# Patient Record
Sex: Male | Born: 1975 | Race: Black or African American | Hispanic: No | Marital: Single | Smoking: Former smoker
Health system: Southern US, Community
[De-identification: ages and names within clinical notes are randomized; demographics above are authoritative.]

## PROBLEM LIST (undated history)

## (undated) DIAGNOSIS — B20 Human immunodeficiency virus [HIV] disease: Secondary | ICD-10-CM

## (undated) DIAGNOSIS — F32A Depression, unspecified: Secondary | ICD-10-CM

## (undated) DIAGNOSIS — Z21 Asymptomatic human immunodeficiency virus [HIV] infection status: Secondary | ICD-10-CM

## (undated) DIAGNOSIS — F329 Major depressive disorder, single episode, unspecified: Secondary | ICD-10-CM

## (undated) HISTORY — DX: Human immunodeficiency virus (HIV) disease: B20

## (undated) HISTORY — DX: Asymptomatic human immunodeficiency virus (hiv) infection status: Z21

---

## 1997-11-30 ENCOUNTER — Encounter: Admission: RE | Admit: 1997-11-30 | Discharge: 1997-11-30 | Payer: Self-pay | Admitting: Family Medicine

## 1997-12-18 ENCOUNTER — Encounter: Admission: RE | Admit: 1997-12-18 | Discharge: 1997-12-18 | Payer: Self-pay | Admitting: Family Medicine

## 1997-12-25 ENCOUNTER — Encounter: Admission: RE | Admit: 1997-12-25 | Discharge: 1997-12-25 | Payer: Self-pay | Admitting: Family Medicine

## 1998-01-01 ENCOUNTER — Encounter: Admission: RE | Admit: 1998-01-01 | Discharge: 1998-01-01 | Payer: Self-pay | Admitting: Sports Medicine

## 1999-02-01 ENCOUNTER — Encounter: Payer: Self-pay | Admitting: Emergency Medicine

## 1999-02-01 ENCOUNTER — Emergency Department (HOSPITAL_COMMUNITY): Admission: EM | Admit: 1999-02-01 | Discharge: 1999-02-01 | Payer: Self-pay | Admitting: Emergency Medicine

## 1999-02-16 ENCOUNTER — Ambulatory Visit (HOSPITAL_COMMUNITY): Admission: RE | Admit: 1999-02-16 | Discharge: 1999-02-16 | Payer: Self-pay | Admitting: Internal Medicine

## 1999-02-16 ENCOUNTER — Encounter: Admission: RE | Admit: 1999-02-16 | Discharge: 1999-02-16 | Payer: Self-pay | Admitting: Internal Medicine

## 1999-02-16 ENCOUNTER — Encounter: Payer: Self-pay | Admitting: Internal Medicine

## 1999-02-18 ENCOUNTER — Ambulatory Visit (HOSPITAL_COMMUNITY): Admission: RE | Admit: 1999-02-18 | Discharge: 1999-02-18 | Payer: Self-pay | Admitting: *Deleted

## 1999-08-07 ENCOUNTER — Emergency Department (HOSPITAL_COMMUNITY): Admission: EM | Admit: 1999-08-07 | Discharge: 1999-08-07 | Payer: Self-pay | Admitting: Emergency Medicine

## 1999-08-11 ENCOUNTER — Ambulatory Visit (HOSPITAL_COMMUNITY): Admission: RE | Admit: 1999-08-11 | Discharge: 1999-08-11 | Payer: Self-pay | Admitting: Hematology and Oncology

## 1999-08-11 ENCOUNTER — Encounter: Admission: RE | Admit: 1999-08-11 | Discharge: 1999-08-11 | Payer: Self-pay | Admitting: Hematology and Oncology

## 1999-10-08 ENCOUNTER — Emergency Department (HOSPITAL_COMMUNITY): Admission: EM | Admit: 1999-10-08 | Discharge: 1999-10-08 | Payer: Self-pay | Admitting: Emergency Medicine

## 2000-08-08 ENCOUNTER — Encounter: Admission: RE | Admit: 2000-08-08 | Discharge: 2000-08-08 | Payer: Self-pay | Admitting: Hematology and Oncology

## 2000-08-16 ENCOUNTER — Ambulatory Visit (HOSPITAL_COMMUNITY): Admission: RE | Admit: 2000-08-16 | Discharge: 2000-08-16 | Payer: Self-pay | Admitting: *Deleted

## 2000-08-16 ENCOUNTER — Encounter: Admission: RE | Admit: 2000-08-16 | Discharge: 2000-08-16 | Payer: Self-pay | Admitting: Internal Medicine

## 2000-11-03 ENCOUNTER — Encounter: Payer: Self-pay | Admitting: Emergency Medicine

## 2000-11-03 ENCOUNTER — Emergency Department (HOSPITAL_COMMUNITY): Admission: EM | Admit: 2000-11-03 | Discharge: 2000-11-03 | Payer: Self-pay | Admitting: Emergency Medicine

## 2000-11-07 ENCOUNTER — Inpatient Hospital Stay (HOSPITAL_COMMUNITY): Admission: EM | Admit: 2000-11-07 | Discharge: 2000-11-09 | Payer: Self-pay | Admitting: Emergency Medicine

## 2000-11-07 ENCOUNTER — Encounter: Payer: Self-pay | Admitting: Internal Medicine

## 2000-11-15 ENCOUNTER — Encounter: Admission: RE | Admit: 2000-11-15 | Discharge: 2000-11-15 | Payer: Self-pay | Admitting: Internal Medicine

## 2001-04-11 ENCOUNTER — Encounter: Admission: RE | Admit: 2001-04-11 | Discharge: 2001-04-11 | Payer: Self-pay

## 2001-04-11 ENCOUNTER — Ambulatory Visit (HOSPITAL_COMMUNITY): Admission: RE | Admit: 2001-04-11 | Discharge: 2001-04-11 | Payer: Self-pay

## 2001-04-15 ENCOUNTER — Encounter: Admission: RE | Admit: 2001-04-15 | Discharge: 2001-04-15 | Payer: Self-pay | Admitting: Internal Medicine

## 2001-04-29 ENCOUNTER — Encounter: Admission: RE | Admit: 2001-04-29 | Discharge: 2001-04-29 | Payer: Self-pay | Admitting: Internal Medicine

## 2001-05-27 ENCOUNTER — Encounter: Admission: RE | Admit: 2001-05-27 | Discharge: 2001-05-27 | Payer: Self-pay | Admitting: Internal Medicine

## 2002-01-18 ENCOUNTER — Encounter: Payer: Self-pay | Admitting: Emergency Medicine

## 2002-01-18 ENCOUNTER — Emergency Department (HOSPITAL_COMMUNITY): Admission: EM | Admit: 2002-01-18 | Discharge: 2002-01-18 | Payer: Self-pay | Admitting: Emergency Medicine

## 2002-04-23 ENCOUNTER — Encounter: Admission: RE | Admit: 2002-04-23 | Discharge: 2002-04-23 | Payer: Self-pay | Admitting: Internal Medicine

## 2002-04-23 ENCOUNTER — Ambulatory Visit (HOSPITAL_COMMUNITY): Admission: RE | Admit: 2002-04-23 | Discharge: 2002-04-23 | Payer: Self-pay | Admitting: Internal Medicine

## 2002-06-17 ENCOUNTER — Ambulatory Visit (HOSPITAL_COMMUNITY): Admission: RE | Admit: 2002-06-17 | Discharge: 2002-06-17 | Payer: Self-pay | Admitting: Internal Medicine

## 2002-06-17 ENCOUNTER — Encounter: Payer: Self-pay | Admitting: Internal Medicine

## 2002-06-17 ENCOUNTER — Encounter: Admission: RE | Admit: 2002-06-17 | Discharge: 2002-06-17 | Payer: Self-pay | Admitting: Internal Medicine

## 2002-07-09 ENCOUNTER — Emergency Department (HOSPITAL_COMMUNITY): Admission: EM | Admit: 2002-07-09 | Discharge: 2002-07-09 | Payer: Self-pay | Admitting: *Deleted

## 2002-07-14 ENCOUNTER — Encounter: Admission: RE | Admit: 2002-07-14 | Discharge: 2002-07-14 | Payer: Self-pay | Admitting: Internal Medicine

## 2003-03-26 ENCOUNTER — Ambulatory Visit (HOSPITAL_COMMUNITY): Admission: RE | Admit: 2003-03-26 | Discharge: 2003-03-26 | Payer: Self-pay | Admitting: Internal Medicine

## 2003-03-26 ENCOUNTER — Encounter: Admission: RE | Admit: 2003-03-26 | Discharge: 2003-03-26 | Payer: Self-pay | Admitting: Internal Medicine

## 2003-04-08 ENCOUNTER — Encounter: Admission: RE | Admit: 2003-04-08 | Discharge: 2003-04-08 | Payer: Self-pay | Admitting: Internal Medicine

## 2003-05-05 ENCOUNTER — Encounter: Admission: RE | Admit: 2003-05-05 | Discharge: 2003-05-05 | Payer: Self-pay | Admitting: Internal Medicine

## 2003-06-16 ENCOUNTER — Emergency Department (HOSPITAL_COMMUNITY): Admission: EM | Admit: 2003-06-16 | Discharge: 2003-06-16 | Payer: Self-pay | Admitting: *Deleted

## 2003-06-16 ENCOUNTER — Encounter: Admission: RE | Admit: 2003-06-16 | Discharge: 2003-06-16 | Payer: Self-pay | Admitting: Internal Medicine

## 2004-07-30 ENCOUNTER — Emergency Department (HOSPITAL_COMMUNITY): Admission: EM | Admit: 2004-07-30 | Discharge: 2004-07-30 | Payer: Self-pay | Admitting: Emergency Medicine

## 2005-02-17 ENCOUNTER — Emergency Department (HOSPITAL_COMMUNITY): Admission: EM | Admit: 2005-02-17 | Discharge: 2005-02-17 | Payer: Self-pay | Admitting: Emergency Medicine

## 2013-08-22 ENCOUNTER — Telehealth: Payer: Self-pay

## 2013-08-22 NOTE — Telephone Encounter (Signed)
Attempted to reach patient via phone. There was no answer and he does not have voicemail.  I will try to call again next week.  [facility].   Laurell Josephsammy K Neldon Shepard, RN

## 2013-10-30 ENCOUNTER — Ambulatory Visit (INDEPENDENT_AMBULATORY_CARE_PROVIDER_SITE_OTHER): Payer: Self-pay | Admitting: Internal Medicine

## 2013-10-30 ENCOUNTER — Ambulatory Visit: Payer: Self-pay

## 2013-10-30 ENCOUNTER — Encounter: Payer: Self-pay | Admitting: Internal Medicine

## 2013-10-30 VITALS — BP 130/90 | HR 91 | Temp 98.4°F | Ht 66.0 in | Wt 125.0 lb

## 2013-10-30 DIAGNOSIS — L209 Atopic dermatitis, unspecified: Secondary | ICD-10-CM

## 2013-10-30 DIAGNOSIS — B20 Human immunodeficiency virus [HIV] disease: Secondary | ICD-10-CM

## 2013-10-30 DIAGNOSIS — Z113 Encounter for screening for infections with a predominantly sexual mode of transmission: Secondary | ICD-10-CM

## 2013-10-30 DIAGNOSIS — L2089 Other atopic dermatitis: Secondary | ICD-10-CM

## 2013-10-30 DIAGNOSIS — Z79899 Other long term (current) drug therapy: Secondary | ICD-10-CM

## 2013-10-30 LAB — COMPLETE METABOLIC PANEL WITH GFR
ALT: 12 U/L (ref 0–53)
AST: 25 U/L (ref 0–37)
Albumin: 3.7 g/dL (ref 3.5–5.2)
Alkaline Phosphatase: 123 U/L — ABNORMAL HIGH (ref 39–117)
BUN: 10 mg/dL (ref 6–23)
CO2: 29 mEq/L (ref 19–32)
Calcium: 9.2 mg/dL (ref 8.4–10.5)
Chloride: 104 mEq/L (ref 96–112)
Creat: 0.93 mg/dL (ref 0.50–1.35)
GFR, Est African American: >89 mL/min
GFR, Est Non African American: >89 mL/min
Glucose, Bld: 74 mg/dL (ref 70–99)
Potassium: 3.9 mEq/L (ref 3.5–5.3)
Sodium: 139 mEq/L (ref 135–145)
Total Bilirubin: 0.3 mg/dL (ref 0.2–1.2)
Total Protein: 7.7 g/dL (ref 6.0–8.3)

## 2013-10-30 LAB — CBC WITH DIFFERENTIAL/PLATELET
Basophils Absolute: 0 10*3/uL (ref 0.0–0.1)
Basophils Relative: 0 % (ref 0–1)
Eosinophils Absolute: 0.6 10*3/uL (ref 0.0–0.7)
Eosinophils Relative: 15 % — ABNORMAL HIGH (ref 0–5)
HCT: 40.3 % (ref 39.0–52.0)
Hemoglobin: 13.9 g/dL (ref 13.0–17.0)
Lymphocytes Relative: 8 % — ABNORMAL LOW (ref 12–46)
Lymphs Abs: 0.3 10*3/uL — ABNORMAL LOW (ref 0.7–4.0)
MCH: 29.9 pg (ref 26.0–34.0)
MCHC: 34.5 g/dL (ref 30.0–36.0)
MCV: 86.7 fL (ref 78.0–100.0)
Monocytes Absolute: 0.2 10*3/uL (ref 0.1–1.0)
Monocytes Relative: 5 % (ref 3–12)
Neutro Abs: 3 10*3/uL (ref 1.7–7.7)
Neutrophils Relative %: 72 % (ref 43–77)
Platelets: 171 10*3/uL (ref 150–400)
RBC: 4.65 MIL/uL (ref 4.22–5.81)
RDW: 14.9 % (ref 11.5–15.5)
WBC: 4.2 10*3/uL (ref 4.0–10.5)

## 2013-10-30 LAB — RPR

## 2013-10-30 LAB — HEPATITIS B SURFACE ANTIGEN: Hepatitis B Surface Ag: NEGATIVE

## 2013-10-30 LAB — LIPID PANEL
CHOLESTEROL: 141 mg/dL (ref 0–200)
HDL: 28 mg/dL — ABNORMAL LOW (ref >39)
LDL Cholesterol: 74 mg/dL (ref 0–99)
Total CHOL/HDL Ratio: 5 Ratio
Triglycerides: 196 mg/dL — ABNORMAL HIGH (ref <150)
VLDL: 39 mg/dL (ref 0–40)

## 2013-10-30 LAB — HEPATITIS B SURFACE ANTIBODY,QUALITATIVE: Hep B S Ab: POSITIVE — AB

## 2013-10-30 LAB — HEPATITIS C ANTIBODY: HCV Ab: NEGATIVE

## 2013-10-30 LAB — HEPATITIS B CORE ANTIBODY, TOTAL: HEP B C TOTAL AB: REACTIVE — AB

## 2013-10-30 LAB — HEPATITIS A ANTIBODY, TOTAL: Hep A Total Ab: REACTIVE — AB

## 2013-10-30 MED ORDER — ELVITEG-COBIC-EMTRICIT-TENOFDF 150-150-200-300 MG PO TABS: 1.0000 | ORAL_TABLET | Freq: Every day | ORAL | Status: DC

## 2013-10-30 MED ORDER — HYDROCORTISONE VALERATE 0.2 % EX OINT: 1.0000 "application " | TOPICAL_OINTMENT | Freq: Two times a day (BID) | CUTANEOUS | Status: DC

## 2013-10-30 MED ORDER — HYDROCORTISONE VALERATE 0.2 % EX OINT
1.0000 | TOPICAL_OINTMENT | Freq: Two times a day (BID) | CUTANEOUS | Status: DC
Start: 2013-10-30 — End: 2014-01-27

## 2013-10-30 NOTE — Progress Notes (Signed)
Patient here today as new transfer from [facility] in [place].  He was diagnosed in 1996 and has been out of care for at least 8 months.  His last regimen was Stribild and Bactrim. Marland Kitchen. He has complaint of unintentional weight loss and  severe itching upper body rash which includes bilateral axilla.  He has removed his shirt and I can see the irritated areas. I will ask Dr Luciana Axeomer to see patient today to evaluate rash.   No vaccine record. He will need updated pneumonia vaccine  and TB skin test at next office visit since we forgot to give it during this visit.    Laurell Josephsammy K Rajan Burgard, RN

## 2013-10-31 ENCOUNTER — Telehealth: Payer: Self-pay | Admitting: Licensed Clinical Social Worker

## 2013-10-31 ENCOUNTER — Encounter: Payer: Self-pay | Admitting: Internal Medicine

## 2013-10-31 ENCOUNTER — Other Ambulatory Visit: Payer: Self-pay | Admitting: Internal Medicine

## 2013-10-31 ENCOUNTER — Telehealth: Payer: Self-pay | Admitting: *Deleted

## 2013-10-31 LAB — HIV-1 RNA ULTRAQUANT REFLEX TO GENTYP+
HIV 1 RNA Quant: 196139 copies/mL — ABNORMAL HIGH (ref <20)
HIV-1 RNA QUANT, LOG: 5.29 {Log} — AB (ref <1.30)

## 2013-10-31 LAB — URINALYSIS
BILIRUBIN URINE: NEGATIVE
Glucose, UA: NEGATIVE mg/dL
Hgb urine dipstick: NEGATIVE
Ketones, ur: NEGATIVE mg/dL
Leukocytes, UA: NEGATIVE
Nitrite: NEGATIVE
Protein, ur: NEGATIVE mg/dL
Specific Gravity, Urine: 1.025 (ref 1.005–1.030)
UROBILINOGEN UA: 1 mg/dL (ref 0.0–1.0)
pH: 6.5 (ref 5.0–8.0)

## 2013-10-31 LAB — T-HELPER CELL (CD4) - (RCID CLINIC ONLY)
CD4 % Helper T Cell: 2 % — ABNORMAL LOW (ref 33–55)
CD4 T Cell Abs: <10 /uL — ABNORMAL LOW (ref 400–2700)

## 2013-10-31 MED ORDER — AZITHROMYCIN 600 MG PO TABS: 600.0000 mg | ORAL_TABLET | ORAL | Status: DC

## 2013-10-31 MED ORDER — SULFAMETHOXAZOLE-TMP DS 800-160 MG PO TABS: 1.0000 | ORAL_TABLET | Freq: Every day | ORAL | Status: DC

## 2013-10-31 NOTE — Telephone Encounter (Signed)
Message copied by Macy Mis on Fri Oct 31, 2013 12:11 PM ------      Message from: [name]      Created: Fri Oct 31, 2013 11:20 AM       Please let him know his CD4 came back and he should be on Bactrim DS 1 tab daily and azithromycin 1200 mg q weekly.  He is waiting for ADAP.  [facility] will [facility] it to [facility].        Thanks ------

## 2013-10-31 NOTE — Assessment & Plan Note (Signed)
I am concerned with Stribild resistance and discussed with the patient and will start Stribild again and follow closely once he gets ADAP approved.  Initial labs today.

## 2013-10-31 NOTE — Assessment & Plan Note (Signed)
Will try high potency steroid.

## 2013-10-31 NOTE — Telephone Encounter (Signed)
Called patient's case worker, Neila Gear and he will notify patient. He is going to expedite the ADAP application and see if patient can afford to get these Rxs now. If not, [facility] may be able to help. Wendall Mola

## 2013-10-31 NOTE — Progress Notes (Signed)
Patient ID: Kurt Foley, male   DOB: Oct 29, 1975, 37 y.o.   MRN: [id number] [facility] CM: Neila Gear  I was unable to complete Adap application, as viral load is not available at this time and is required for application. [facility] paid for Zithromax and Bactrim. Cm obtained a 30 day supply voucher for Stribild. Client will start all three meds today. Cm will monitor treatment adherence weekly. Adap will be approved prior to next refill.

## 2013-10-31 NOTE — Telephone Encounter (Signed)
Ok with me for the note.

## 2013-10-31 NOTE — Telephone Encounter (Signed)
Spoke with Trinna Post at [facility] and the patient wants a note so he can have a few days off because he is feeling bad and fatigue, will be starting medications this weekend. [facility] will help him get his antibiotics and stribild. Patient is going to stay out of work this weekend and hopes Dr. Luciana Axe will write the note when he returns next week.

## 2013-10-31 NOTE — Progress Notes (Signed)
   Subjective:    Patient ID: Kurt Foley, male    DOB: 22-Jul-1975, 38 y.o.   MRN: [id number]  HPI Here to establish care as a new patient with 042.  Was initially diagnosed around 1995 and has been on previous regimens with nevaripine, Kaletra and others he is unable to remember and most recently started Stribild last year while in [place].  He had been on that and was undetectable with good CD4 count but let his ADAP lapse and had sporadic care after that.  He does not know of any history of resistance though did stop and start Stribild at least twice.  Denies any history of OIs, no STIs.  He is interested in treatment.  No weight loss, no diarrhea.     Also with a rash that has been pruritic, on back and arm.  Has used Aveno, OTC hydrocortisone.     Review of Systems  Constitutional: Negative for chills, fatigue and unexpected weight change.  HENT: Negative for sore throat.   Eyes: Negative for visual disturbance.  Respiratory: Negative for cough and shortness of breath.   Cardiovascular: Negative for chest pain.  Gastrointestinal: Negative for nausea, abdominal pain and diarrhea.  Genitourinary: Negative for genital sores.  Skin: Positive for rash.  Neurological: Negative for dizziness, light-headedness and headaches.  Hematological: Negative for adenopathy.  Psychiatric/Behavioral: Negative for sleep disturbance.       Objective:   Physical Exam  Constitutional: He appears well-developed and well-nourished. No distress.  HENT:  Mouth/Throat: No oropharyngeal exudate.  Eyes: Right eye exhibits no discharge. Left eye exhibits no discharge. No scleral icterus.  Cardiovascular: Normal rate, regular rhythm and normal heart sounds.   No murmur heard. Pulmonary/Chest: Effort normal and breath sounds normal. No respiratory distress. He has no wheezes.  Abdominal: Soft. Bowel sounds are normal. He exhibits no distension. There is no tenderness.  Lymphadenopathy:    He has no  cervical adenopathy.  Skin:  + rash on back, arms, c/w atopic dermatitis  Psychiatric: He has a normal mood and affect. His behavior is normal.          Assessment & Plan:

## 2013-11-04 ENCOUNTER — Encounter: Payer: Self-pay | Admitting: Licensed Clinical Social Worker

## 2013-11-04 ENCOUNTER — Telehealth: Payer: Self-pay | Admitting: *Deleted

## 2013-11-04 DIAGNOSIS — B20 Human immunodeficiency virus [HIV] disease: Secondary | ICD-10-CM

## 2013-11-04 MED ORDER — ELVITEG-COBIC-EMTRICIT-TENOFDF 150-150-200-300 MG PO TABS: 1.0000 | ORAL_TABLET | Freq: Every day | ORAL | Status: DC

## 2013-11-04 NOTE — Telephone Encounter (Signed)
error 

## 2013-11-06 LAB — HLA B*5701: HLA-B*5701 w/rflx HLA-B High: NEGATIVE

## 2013-11-13 ENCOUNTER — Ambulatory Visit: Payer: Self-pay | Admitting: Internal Medicine

## 2013-11-13 LAB — HIV-1 GENOTYPR PLUS

## 2013-11-18 ENCOUNTER — Ambulatory Visit (INDEPENDENT_AMBULATORY_CARE_PROVIDER_SITE_OTHER): Payer: Self-pay | Admitting: Internal Medicine

## 2013-11-18 ENCOUNTER — Encounter: Payer: Self-pay | Admitting: Internal Medicine

## 2013-11-18 VITALS — BP 125/81 | HR 84 | Temp 98.2°F | Ht 66.0 in | Wt 126.0 lb

## 2013-11-18 DIAGNOSIS — Z23 Encounter for immunization: Secondary | ICD-10-CM

## 2013-11-18 DIAGNOSIS — B20 Human immunodeficiency virus [HIV] disease: Secondary | ICD-10-CM

## 2013-11-18 DIAGNOSIS — H539 Unspecified visual disturbance: Secondary | ICD-10-CM

## 2013-11-18 NOTE — Progress Notes (Signed)
   Subjective:    Patient ID: ARLOW COACHMAN, male    DOB: [birth year], 38 y.o.   MRN: [id number]  HPI  Here to establish care as a new patient with 042.  Was initially diagnosed around 1995 and has been on previous regimens with nevaripine, Kaletra and others he is unable to remember and most recently started Stribild last year while in [place].     I saw him two weeks ago and got labs with a CD4 <10 and viral load of 196,139.  He was able to get Stribild through pt assisstance, azithromycin 1200 mg weekly and bactrim DS 1 daily and taking all.  No missed doses.  ADAP still in process.     He also complains of some vision changes.  Has been ongoing for more than 2 months.  Some blurry vision, some spots, mild photophobia.  No vision loss, no change since starting Stribild.  Increased issues with accomodation.     Review of Systems  Constitutional: Negative for chills, fatigue and unexpected weight change.  HENT: Negative for sore throat.   Eyes: Negative for visual disturbance.  Respiratory: Negative for cough and shortness of breath.   Cardiovascular: Negative for chest pain.  Gastrointestinal: Negative for nausea, abdominal pain and diarrhea.  Genitourinary: Negative for genital sores.  Skin: Positive for rash.  Neurological: Negative for dizziness, light-headedness and headaches.  Hematological: Negative for adenopathy.  Psychiatric/Behavioral: Negative for sleep disturbance.       Objective:   Physical Exam  Constitutional: He appears well-developed and well-nourished. No distress.  HENT:  Mouth/Throat: No oropharyngeal exudate.  Eyes: Right eye exhibits no discharge. Left eye exhibits no discharge. No scleral icterus.  Cardiovascular: Normal rate, regular rhythm and normal heart sounds.   No murmur heard. Pulmonary/Chest: Effort normal and breath sounds normal. No respiratory distress. He has no wheezes.  Abdominal: Soft. Bowel sounds are normal. He exhibits no  distension. There is no tenderness.  Lymphadenopathy:    He has no cervical adenopathy.  Skin:  + rash on back, arms, c/w atopic dermatitis  Psychiatric: He has a normal mood and affect. His behavior is normal.          Assessment & Plan:

## 2013-11-18 NOTE — Assessment & Plan Note (Signed)
Doing well on medicaitons.  Discussed compliace, likely some fatigue while he gets better.

## 2013-11-18 NOTE — Assessment & Plan Note (Signed)
No concerning signs, no pain, has been ongoing for some time and no changes with therapy to [facility] me concerned with IRIS.  Patient counseled on concerning signs.  Will refer to opthalmology as soon as able.

## 2013-12-03 ENCOUNTER — Telehealth: Payer: Self-pay | Admitting: *Deleted

## 2013-12-03 NOTE — Telephone Encounter (Signed)
Patient forgot to follow up with [facility] for an [employer] application.  Pt still having blurry vision, but hasn't changed since his appointment.  RN advised him that if he wants to see an eye doctor, he would have to have insurance or get the orange card.  RN gave patient Karen's phone number at [facility].  Pt stated he would call today. Andree CossHowell, Michelle M, RN

## 2013-12-03 NOTE — Telephone Encounter (Signed)
error 

## 2013-12-16 ENCOUNTER — Ambulatory Visit: Payer: Self-pay | Admitting: Internal Medicine

## 2014-01-13 ENCOUNTER — Other Ambulatory Visit: Payer: Self-pay

## 2014-01-27 ENCOUNTER — Other Ambulatory Visit: Payer: Self-pay | Admitting: *Deleted

## 2014-01-27 ENCOUNTER — Other Ambulatory Visit (INDEPENDENT_AMBULATORY_CARE_PROVIDER_SITE_OTHER): Payer: Self-pay

## 2014-01-27 ENCOUNTER — Telehealth: Payer: Self-pay | Admitting: *Deleted

## 2014-01-27 DIAGNOSIS — B20 Human immunodeficiency virus [HIV] disease: Secondary | ICD-10-CM

## 2014-01-27 LAB — COMPLETE METABOLIC PANEL WITH GFR
ALK PHOS: 91 U/L (ref 39–117)
ALT: 14 U/L (ref 0–53)
AST: 23 U/L (ref 0–37)
Albumin: 3.7 g/dL (ref 3.5–5.2)
BILIRUBIN TOTAL: 0.3 mg/dL (ref 0.2–1.2)
BUN: 8 mg/dL (ref 6–23)
CO2: 24 mEq/L (ref 19–32)
Calcium: 8.6 mg/dL (ref 8.4–10.5)
Chloride: 104 mEq/L (ref 96–112)
Creat: 1.05 mg/dL (ref 0.50–1.35)
GFR, Est African American: >89 mL/min
GFR, Est Non African American: 89 mL/min
Glucose, Bld: 91 mg/dL (ref 70–99)
POTASSIUM: 4.1 meq/L (ref 3.5–5.3)
SODIUM: 137 meq/L (ref 135–145)
TOTAL PROTEIN: 7.1 g/dL (ref 6.0–8.3)

## 2014-01-27 LAB — CBC WITH DIFFERENTIAL/PLATELET
BASOS PCT: 0 % (ref 0–1)
Basophils Absolute: 0 10*3/uL (ref 0.0–0.1)
EOS PCT: 7 % — AB (ref 0–5)
Eosinophils Absolute: 0.5 10*3/uL (ref 0.0–0.7)
HEMATOCRIT: 39.9 % (ref 39.0–52.0)
Hemoglobin: 13.7 g/dL (ref 13.0–17.0)
Lymphocytes Relative: 10 % — ABNORMAL LOW (ref 12–46)
Lymphs Abs: 0.7 10*3/uL (ref 0.7–4.0)
MCH: 31.4 pg (ref 26.0–34.0)
MCHC: 34.3 g/dL (ref 30.0–36.0)
MCV: 91.3 fL (ref 78.0–100.0)
MONO ABS: 0.4 10*3/uL (ref 0.1–1.0)
Monocytes Relative: 6 % (ref 3–12)
NEUTROS ABS: 5.2 10*3/uL (ref 1.7–7.7)
Neutrophils Relative %: 77 % (ref 43–77)
Platelets: 202 10*3/uL (ref 150–400)
RBC: 4.37 MIL/uL (ref 4.22–5.81)
RDW: 16.3 % — ABNORMAL HIGH (ref 11.5–15.5)
WBC: 6.8 10*3/uL (ref 4.0–10.5)

## 2014-01-27 MED ORDER — TRIAMCINOLONE ACETONIDE 0.5 % EX OINT: 1.0000 "application " | TOPICAL_OINTMENT | Freq: Two times a day (BID) | CUTANEOUS | Status: DC

## 2014-01-27 MED ORDER — EUCERIN EX LOTN: TOPICAL_LOTION | CUTANEOUS | Status: DC | PRN

## 2014-01-27 NOTE — Telephone Encounter (Signed)
Patient unable to afford west-cort (hydrocortisone) prescribed 10/30/13. Cost was $145.  Patient has tried OTC creams since then, but they have been ineffective.  Per Baird Lyonsasey, [facility] may be able to help patient pay for combination of triamcinolone with eucerin - this was recommended by [facility] as a substitute, if acceptable.  Per pharmacy, this would cost approx $30.   Please advise if this is appropriate.  RN will notify Baird LyonsCasey @ [facility]. Andree CossHowell, Michelle M, RN

## 2014-01-27 NOTE — Telephone Encounter (Signed)
Sounds good to me thanks!

## 2014-01-28 LAB — HIV-1 RNA QUANT-NO REFLEX-BLD

## 2014-01-28 LAB — T-HELPER CELL (CD4) - (RCID CLINIC ONLY)
CD4 T CELL HELPER: 5 % — AB (ref 33–55)
CD4 T Cell Abs: 40 /uL — ABNORMAL LOW (ref 400–2700)

## 2014-02-11 ENCOUNTER — Telehealth: Payer: Self-pay | Admitting: *Deleted

## 2014-02-11 NOTE — Telephone Encounter (Signed)
Patient walked into clinic today c/o diarrhea x 2 days, fatigue, and weight loss. Checked weight and a 10 lb loss in 3 months. States he has been taking his medication regularly with only one missed dose. He has a scheduled appt with Dr. Luciana Axe for tomorrow at 9:45 AM. Advised patient to drink plenty of fluids such as water or gatoraide and to keep his appt in the AM with Dr. Luciana Axe. Patient given work note for today.

## 2014-02-12 ENCOUNTER — Encounter: Payer: Self-pay | Admitting: Internal Medicine

## 2014-02-12 ENCOUNTER — Other Ambulatory Visit: Payer: Self-pay | Admitting: *Deleted

## 2014-02-12 ENCOUNTER — Ambulatory Visit (INDEPENDENT_AMBULATORY_CARE_PROVIDER_SITE_OTHER): Payer: Self-pay | Admitting: Internal Medicine

## 2014-02-12 VITALS — BP 140/65 | HR 92 | Temp 97.5°F | Wt 117.0 lb

## 2014-02-12 DIAGNOSIS — L209 Atopic dermatitis, unspecified: Secondary | ICD-10-CM

## 2014-02-12 DIAGNOSIS — R634 Abnormal weight loss: Secondary | ICD-10-CM

## 2014-02-12 DIAGNOSIS — Z23 Encounter for immunization: Secondary | ICD-10-CM

## 2014-02-12 DIAGNOSIS — B20 Human immunodeficiency virus [HIV] disease: Secondary | ICD-10-CM

## 2014-02-12 DIAGNOSIS — L2089 Other atopic dermatitis: Secondary | ICD-10-CM

## 2014-02-12 MED ORDER — DRONABINOL 10 MG PO CAPS: 10.0000 mg | ORAL_CAPSULE | Freq: Two times a day (BID) | ORAL | Status: DC

## 2014-02-12 MED ORDER — AZITHROMYCIN 600 MG PO TABS: 600.0000 mg | ORAL_TABLET | ORAL | Status: DC

## 2014-02-12 NOTE — Assessment & Plan Note (Signed)
Improved with topical therapy.  

## 2014-02-12 NOTE — Assessment & Plan Note (Signed)
Much improved.  Still needs OI prophylaxis.  rtc 2 months.

## 2014-02-12 NOTE — Progress Notes (Signed)
   Subjective:    Patient ID: Kurt Foley, male    DOB: 29-Nov-1975, 38 y.o.   MRN: [id number]  HPI Here for follow up of 042.  Was initially diagnosed around 1995 and has been on previous regimens with nevaripine, Kaletra and others he is unable to remember and most recently started Stribild last year while in [place].    Initial CD4 <10 and viral load of 196,139 and started Stribild through pt assisstance, azithromycin 1200 mg weekly and bactrim DS 1 daily and taking all.  No missed doses.  CD 4 now up to 40 and viral load undetectable.   Vision and skin changes improved.  Some loose stools the last 2 days but is improving.  Some weight loss of 10 lbs.     Review of Systems  Constitutional: Negative for chills, fatigue and unexpected weight change.  HENT: Negative for sore throat.   Eyes: Negative for visual disturbance.  Respiratory: Negative for cough and shortness of breath.   Cardiovascular: Negative for chest pain.  Gastrointestinal: Negative for nausea, abdominal pain and diarrhea.  Genitourinary: Negative for genital sores.  Skin: Positive for rash.  Neurological: Negative for dizziness, light-headedness and headaches.  Hematological: Negative for adenopathy.  Psychiatric/Behavioral: Negative for sleep disturbance.       Objective:   Physical Exam  Constitutional: He appears well-developed and well-nourished. No distress.  HENT:  Mouth/Throat: No oropharyngeal exudate.  Eyes: Right eye exhibits no discharge. Left eye exhibits no discharge. No scleral icterus.  Cardiovascular: Normal rate, regular rhythm and normal heart sounds.   No murmur heard. Pulmonary/Chest: Effort normal and breath sounds normal. No respiratory distress. He has no wheezes.  Abdominal: Soft. Bowel sounds are normal. He exhibits no distension. There is no tenderness.  Lymphadenopathy:    He has no cervical adenopathy.  Skin:  + rash on back, arms, c/w atopic dermatitis  Psychiatric: He has  a normal mood and affect. His behavior is normal.          Assessment & Plan:

## 2014-02-12 NOTE — Assessment & Plan Note (Signed)
Poor appetite so will try marinol.  Encouraged to eat protein, mvi.

## 2014-03-13 ENCOUNTER — Telehealth: Payer: Self-pay | Admitting: Licensed Clinical Social Worker

## 2014-03-13 ENCOUNTER — Emergency Department (INDEPENDENT_AMBULATORY_CARE_PROVIDER_SITE_OTHER)
Admission: EM | Admit: 2014-03-13 | Discharge: 2014-03-13 | Disposition: A | Discharge status: Nursing Home (Includes ICF) | Payer: Self-pay | Source: Home / Self Care | Attending: Family Medicine | Admitting: Family Medicine

## 2014-03-13 ENCOUNTER — Emergency Department (HOSPITAL_COMMUNITY): Payer: Self-pay

## 2014-03-13 ENCOUNTER — Encounter (HOSPITAL_COMMUNITY): Payer: Self-pay | Admitting: Emergency Medicine

## 2014-03-13 ENCOUNTER — Emergency Department (HOSPITAL_COMMUNITY)
Admission: EM | Admit: 2014-03-13 | Discharge: 2014-03-13 | Disposition: A | Discharge status: Home / Self Care | Payer: Self-pay | Attending: Emergency Medicine | Admitting: Emergency Medicine

## 2014-03-13 DIAGNOSIS — Z720 Tobacco use: Secondary | ICD-10-CM | POA: Insufficient documentation

## 2014-03-13 DIAGNOSIS — J029 Acute pharyngitis, unspecified: Secondary | ICD-10-CM | POA: Insufficient documentation

## 2014-03-13 DIAGNOSIS — R0602 Shortness of breath: Secondary | ICD-10-CM

## 2014-03-13 DIAGNOSIS — B20 Human immunodeficiency virus [HIV] disease: Secondary | ICD-10-CM

## 2014-03-13 DIAGNOSIS — G44039 Episodic paroxysmal hemicrania, not intractable: Secondary | ICD-10-CM | POA: Insufficient documentation

## 2014-03-13 DIAGNOSIS — Z7952 Long term (current) use of systemic steroids: Secondary | ICD-10-CM | POA: Insufficient documentation

## 2014-03-13 DIAGNOSIS — R197 Diarrhea, unspecified: Secondary | ICD-10-CM

## 2014-03-13 DIAGNOSIS — Z79899 Other long term (current) drug therapy: Secondary | ICD-10-CM | POA: Insufficient documentation

## 2014-03-13 DIAGNOSIS — Z21 Asymptomatic human immunodeficiency virus [HIV] infection status: Secondary | ICD-10-CM | POA: Insufficient documentation

## 2014-03-13 DIAGNOSIS — J3489 Other specified disorders of nose and nasal sinuses: Secondary | ICD-10-CM

## 2014-03-13 LAB — CBC WITH DIFFERENTIAL/PLATELET
BASOS PCT: 0 % (ref 0–1)
Basophils Absolute: 0 10*3/uL (ref 0.0–0.1)
EOS ABS: 0.2 10*3/uL (ref 0.0–0.7)
Eosinophils Relative: 3 % (ref 0–5)
HCT: 43.7 % (ref 39.0–52.0)
Hemoglobin: 14.9 g/dL (ref 13.0–17.0)
Lymphocytes Relative: 12 % (ref 12–46)
Lymphs Abs: 0.8 10*3/uL (ref 0.7–4.0)
MCH: 31.6 pg (ref 26.0–34.0)
MCHC: 34.1 g/dL (ref 30.0–36.0)
MCV: 92.8 fL (ref 78.0–100.0)
MONOS PCT: 6 % (ref 3–12)
Monocytes Absolute: 0.4 10*3/uL (ref 0.1–1.0)
Neutro Abs: 5.2 10*3/uL (ref 1.7–7.7)
Neutrophils Relative %: 79 % — ABNORMAL HIGH (ref 43–77)
Platelets: 217 10*3/uL (ref 150–400)
RBC: 4.71 MIL/uL (ref 4.22–5.81)
RDW: 13.9 % (ref 11.5–15.5)
WBC: 6.6 10*3/uL (ref 4.0–10.5)

## 2014-03-13 LAB — BASIC METABOLIC PANEL
Anion gap: 13 (ref 5–15)
BUN: 7 mg/dL (ref 6–23)
CO2: 24 mEq/L (ref 19–32)
Calcium: 9.1 mg/dL (ref 8.4–10.5)
Chloride: 101 mEq/L (ref 96–112)
Creatinine, Ser: 0.94 mg/dL (ref 0.50–1.35)
Glucose, Bld: 106 mg/dL — ABNORMAL HIGH (ref 70–99)
POTASSIUM: 3.9 meq/L (ref 3.7–5.3)
SODIUM: 138 meq/L (ref 137–147)

## 2014-03-13 MED ORDER — ACETAMINOPHEN 500 MG PO TABS
500.0000 mg | ORAL_TABLET | Freq: Once | ORAL | Status: AC
  Administered 2014-03-13: 500 mg via ORAL
  Filled 2014-03-13: qty 1

## 2014-03-13 NOTE — ED Notes (Signed)
Pt A&OX4, ambulatory at d/c with steady gait, NAD 

## 2014-03-13 NOTE — ED Notes (Signed)
EKG hand delivered to Dr. Walden. 

## 2014-03-13 NOTE — Telephone Encounter (Signed)
Patient complains of diarrhea, abdominal pain, sob, cough, fatigue, and dizziness since 2 days ago. Patient wanted to be seen in our office today at 4:30 pm, I explained to him that our office was closed and no providers were available. I advised the patient to go the ED, due to the extent of his symptoms and that his viral load was really low as of 1 month ago. Patient agreed and was ok with this plan.

## 2014-03-13 NOTE — ED Provider Notes (Signed)
Kurt SolianReginald K Foley is a 38 y.o. male who presents to Urgent Care today for fatigue shortness of breath diarrhea and a cough. This is also associated with headache. Symptoms present for 2 days. Patient has a history of HIV with low CD4 count at 40 measured about a month ago.  He has a history of noncompliance but is currently taking his medication.  He notes mild nonradiating nonexertional chest pain as well. He has not tried any medications yet. He called his infectious disease clinic today and was advised to go to the emergency department.   Past Medical History  Diagnosis Date  . HIV infection    History  Substance Use Topics  . Smoking status: Current Every Day Smoker -- 0.50 packs/day for 25 years    Types: Cigarettes  . Smokeless tobacco: Never Used     Comment: cutting back  . Alcohol Use: 1.0 oz/week    2 drink(s) per week     Comment: occasional   ROS as above Medications: No current facility-administered medications for this encounter.   Current Outpatient Prescriptions  Medication Sig Dispense Refill  . elvitegravir-cobicistat-emtricitabine-tenofovir (STRIBILD) 150-150-200-300 MG TABS tablet Take 1 tablet by mouth daily with breakfast.  30 tablet  5  . azithromycin (ZITHROMAX) 600 MG tablet Take 1 tablet (600 mg total) by mouth every 7 (seven) days.  10 tablet  5  . dronabinol (MARINOL) 10 MG capsule Take 1 capsule (10 mg total) by mouth 2 (two) times daily before a meal.  60 capsule  5  . Emollient (EUCERIN) lotion Apply topically as needed for dry skin.  240 mL  1  . sulfamethoxazole-trimethoprim (BACTRIM DS) 800-160 MG per tablet Take 1 tablet by mouth daily.  30 tablet  11  . triamcinolone ointment (KENALOG) 0.5 % Apply 1 application topically 2 (two) times daily.  30 g  1    Exam:  BP 151/111  Pulse 123  Temp(Src) 98.1 F (36.7 C) (Oral)  Resp 18  SpO2 98% Gen: Well NAD nontoxic appearing HEENT: EOMI,  MMM Lungs: Normal work of breathing. Decreased breath sounds  right lung compared to left. Heart: RRR no MRG Abd: NABS, Soft. Nondistended, Nontender Exts: Brisk capillary refill, warm and well perfused.   Twelve-lead EKG shows normal sinus rhythm at 90 beats per minute. Short PR at 110 ms. No ST segment elevation or depression. Small Q waves in lead II  No results found for this or any previous visit (from the past 24 hour(s)). No results found.  Assessment and Plan: 38 y.o. male with AIDS with cough congestion shortness of breath diarrhea and fatigue. The patient has a large differential. We're unable to perform chest x-rays at this location currently. Patient likely will require more care than we are able to provide. Plan to transfer to the emergency department for further evaluation and management. Transfer via shuttle.  Discussed warning signs or symptoms. Please see discharge instructions. Patient expresses understanding.     Rodolph BongEvan S Jennamarie Goings, MD 03/13/14 [phone]

## 2014-03-13 NOTE — ED Notes (Signed)
C/o feeling fatigue, weakness, diarrhea, epigastric pain, chills, runny nose, cough onset 2 days Denies f/v Alert, no signs of acute distress.

## 2014-03-13 NOTE — ED Notes (Signed)
Pt in c/o cough and congestion also headache and fatigue, denies fever at home, states cough has been productive with clear sputum

## 2014-03-13 NOTE — ED Provider Notes (Signed)
CSN: [id number]     Arrival date & time 03/13/14  1802 History   First MD Initiated Contact with Patient 03/13/14 2043     Chief Complaint  Patient presents with  . Cough  . Headache   Patient is a 38 y.o. male presenting with cough and headaches. The history is provided by the patient.  Cough Cough characteristics:  Dry Onset quality:  Gradual Duration:  2 days Progression:  Unchanged Chronicity:  New Associated symptoms: headaches and rhinorrhea   Associated symptoms: no chest pain, no chills, no fever, no rash, no shortness of breath and no sore throat   Headaches:    Severity:  Moderate   Onset quality:  Gradual   Duration:  2 days   Timing:  Intermittent   Progression:  Waxing and waning   Chronicity:  Recurrent Headache Associated symptoms: congestion and cough   Associated symptoms: no abdominal pain, no back pain, no diarrhea, no fever, no nausea, no neck pain, no sore throat and no vomiting    38 year old [place] male with a history of HIV most recent CD4 count of 40 presents with history of viral URI symptoms and headache. Symptoms have been ongoing for the last 2 days. The patient was concerned because of his low CD4 count and decided to be checked the urgent care. Urgent care recommended that the patient come to the emergency department for further evaluation. Patient denies fevers chills or night sweats. He denies extremity numbness, weakness, blurred vision, difficulty speaking. He denies cough productive of sputum. Past Medical History  Diagnosis Date  . HIV infection    History reviewed. No pertinent past surgical history. Family History  Problem Relation Age of Onset  . Diabetes Mother   . Hypertension Mother   . Arthritis Mother    History  Substance Use Topics  . Smoking status: Current Every Day Smoker -- 0.50 packs/day for 25 years    Types: Cigarettes  . Smokeless tobacco: Never Used     Comment: cutting back  . Alcohol Use: 1.0 oz/week   2 drink(s) per week     Comment: occasional    Review of Systems  Constitutional: Negative for fever and chills.  HENT: Positive for congestion and rhinorrhea. Negative for sore throat.   Eyes: Negative for visual disturbance.  Respiratory: Positive for cough. Negative for shortness of breath.   Cardiovascular: Negative for chest pain and palpitations.  Gastrointestinal: Negative for nausea, vomiting, abdominal pain, diarrhea and constipation.  Genitourinary: Negative for dysuria and hematuria.  Musculoskeletal: Negative for back pain and neck pain.  Skin: Negative for rash.  Neurological: Positive for headaches. Negative for syncope.  Psychiatric/Behavioral: Negative for confusion.  All other systems reviewed and are negative.   Allergies  Review of patient's allergies indicates no known allergies.  Home Medications   Prior to Admission medications   Medication Sig Start Date End Date Taking? Authorizing Provider  dronabinol (MARINOL) 10 MG capsule Take 10 mg by mouth 2 (two) times daily before a meal. 02/12/14  Yes Gardiner Barefootobert W Comer, MD  elvitegravir-cobicistat-emtricitabine-tenofovir (STRIBILD) 150-150-200-300 MG TABS tablet Take 1 tablet by mouth daily with breakfast. 11/04/13  Yes Randall Hissornelius N Van Dam, MD  Emollient (EUCERIN) lotion Apply topically as needed for dry skin. 01/27/14  Yes Gardiner Barefootobert W Comer, MD  triamcinolone ointment (KENALOG) 0.5 % Apply 1 application topically 2 (two) times daily. 01/27/14  Yes Gardiner Barefootobert W Comer, MD   BP 135/98  Pulse 82  Temp(Src) 98.2 F (36.8 C) (  Oral)  Resp 16  SpO2 99% Physical Exam  Constitutional: He is oriented to person, place, and time. He appears well-developed and well-nourished. No distress.  Thin appearing AAM  HENT:  Head: Normocephalic and atraumatic.  Mouth/Throat: Oropharynx is clear and moist.  No thrush  Eyes: EOM are normal.  Neck: Neck supple. No JVD present.  Cardiovascular: Normal rate, regular rhythm, normal heart sounds  and intact distal pulses.   Pulmonary/Chest: Effort normal and breath sounds normal.  Abdominal: Soft. He exhibits no distension. There is no tenderness.  Musculoskeletal: Normal range of motion. He exhibits no edema.  Neurological: He is alert and oriented to person, place, and time. No cranial nerve deficit.  Skin: Skin is warm and dry.  Psychiatric: His behavior is normal.    ED Course  Procedures  None   Labs Review Labs Reviewed  CBC WITH DIFFERENTIAL - Abnormal; Notable for the following:    Neutrophils Relative % 79 (*)    All other components within normal limits  BASIC METABOLIC PANEL - Abnormal; Notable for the following:    Glucose, Bld 106 (*)    All other components within normal limits    Imaging Review Dg Chest 2 View  03/13/2014   CLINICAL DATA:  Initial encounter, pressure in back of head for 2 days. Acute on chronic shortness of breath with exertion.  EXAM: CHEST  2 VIEW  COMPARISON:  None.  FINDINGS: Trachea is midline. Heart size normal. Lungs appear hyperinflated but clear. No pleural fluid. Old left rib fracture.  IMPRESSION: Hyperinflation without acute finding.   Electronically Signed   By: Leanna Battles M.D.   On: 03/13/2014 22:38    EKG Interpretation Date/Time:  Friday March 13 2014 21:40:05 EDT Ventricular Rate:  76 PR Interval:  122 QRS Duration: 92 QT Interval:  391 QTC Calculation: 440 R Axis:   83 Text Interpretation:  Sinus rhythm Biatrial enlargement Left ventricular  hypertrophy Confirmed by Gwendolyn Grant  MD, BLAIR ([id number]) on 03/13/2014 9:43:41 PM    MDM   Final diagnoses:  Nonintractable paroxysmal hemicrania, unspecified chronicity pattern  Rhinorrhea  Sore throat   38 year old [place] American male with history of HIV who presents with fatigue headache and viral URI symptoms. Patient is well appearing on exam. He is afebrile vital signs are stable. Chest is nontender to palpation lungs are clear to auscultation. Abdomen is soft nontender  and nondistended. Because membranes appear moist. Labs obtained are unremarkable. A chest x-ray does not show any focal consolidation concerning for pneumonia. Would like to obtain a CT head to rule out any intracranial abnormality given the patient's most recent CD4 count of 40. However the patient refuses to have this study performed. After further questioning the patient states that his headaches are chronic and intermittent. This headache is similar to prior headaches. Feel this is reasonable as he has a normal neuro exam and no fever. Patient states he has close followup with his primary care provider. Patient given Tylenol her symptoms. Feel he is stable for discharge at this time.  Case discussed with Dr. Gwendolyn Grant.  Maris Berger, MD  Maris Berger, MD 03/14/14 [phone]

## 2014-03-14 NOTE — ED Provider Notes (Signed)
I saw and evaluated the patient, reviewed the resident's note and I agree with the findings and plan.   EKG Interpretation   Date/Time:  Friday March 13 2014 21:40:05 EDT Ventricular Rate:  76 PR Interval:  122 QRS Duration: 92 QT Interval:  391 QTC Calculation: 440 R Axis:   83 Text Interpretation:  Sinus rhythm Biatrial enlargement Left ventricular  hypertrophy Confirmed by Gwendolyn GrantWALDEN  MD, Zion Ta ([id number]) on 03/13/2014 9:43:41 PM      Patient here with headaches, cough. Hx of HIV/AIDS with last CD4 count last than 40. Patient well-appearing, neurologically intact. CXR clear, labs unremarkable. Refused head CT. Patient wants to leave, well-appearing, instructed to f/u with PCP.  Elwin MochaBlair Ryson Bacha, MD 03/14/14 [phone]

## 2014-03-31 ENCOUNTER — Other Ambulatory Visit: Payer: Self-pay

## 2014-04-14 ENCOUNTER — Ambulatory Visit: Payer: Self-pay | Admitting: Internal Medicine

## 2014-04-14 ENCOUNTER — Telehealth: Payer: Self-pay | Admitting: *Deleted

## 2014-04-14 NOTE — Telephone Encounter (Signed)
Called and left patient a message to call the clinic to reschedule his appt. He no showed today.

## 2014-06-17 ENCOUNTER — Emergency Department (HOSPITAL_COMMUNITY): Payer: Self-pay

## 2014-06-17 ENCOUNTER — Encounter (HOSPITAL_COMMUNITY): Payer: Self-pay | Admitting: *Deleted

## 2014-06-17 ENCOUNTER — Emergency Department (HOSPITAL_COMMUNITY)
Admission: EM | Admit: 2014-06-17 | Discharge: 2014-06-18 | Disposition: A | Discharge status: Home / Self Care | Payer: Self-pay | Attending: Emergency Medicine | Admitting: Emergency Medicine

## 2014-06-17 DIAGNOSIS — R400 Somnolence: Secondary | ICD-10-CM | POA: Insufficient documentation

## 2014-06-17 DIAGNOSIS — Z7952 Long term (current) use of systemic steroids: Secondary | ICD-10-CM | POA: Insufficient documentation

## 2014-06-17 DIAGNOSIS — Z720 Tobacco use: Secondary | ICD-10-CM | POA: Insufficient documentation

## 2014-06-17 DIAGNOSIS — Z21 Asymptomatic human immunodeficiency virus [HIV] infection status: Secondary | ICD-10-CM | POA: Insufficient documentation

## 2014-06-17 DIAGNOSIS — Z79899 Other long term (current) drug therapy: Secondary | ICD-10-CM | POA: Insufficient documentation

## 2014-06-17 LAB — ETHANOL: Alcohol, Ethyl (B): <5 mg/dL (ref 0–9)

## 2014-06-17 LAB — CBC
HCT: 44.1 % (ref 39.0–52.0)
HEMOGLOBIN: 14.6 g/dL (ref 13.0–17.0)
MCH: 30.5 pg (ref 26.0–34.0)
MCHC: 33.1 g/dL (ref 30.0–36.0)
MCV: 92.3 fL (ref 78.0–100.0)
PLATELETS: 193 10*3/uL (ref 150–400)
RBC: 4.78 MIL/uL (ref 4.22–5.81)
RDW: 13.7 % (ref 11.5–15.5)
WBC: 6 10*3/uL (ref 4.0–10.5)

## 2014-06-17 LAB — BASIC METABOLIC PANEL
Anion gap: 10 (ref 5–15)
BUN: 7 mg/dL (ref 6–23)
CALCIUM: 9.2 mg/dL (ref 8.4–10.5)
CHLORIDE: 104 meq/L (ref 96–112)
CO2: 23 mmol/L (ref 19–32)
Creatinine, Ser: 0.83 mg/dL (ref 0.50–1.35)
Glucose, Bld: 92 mg/dL (ref 70–99)
Potassium: 4 mmol/L (ref 3.5–5.1)
Sodium: 137 mmol/L (ref 135–145)

## 2014-06-17 LAB — I-STAT TROPONIN, ED: TROPONIN I, POC: 0 ng/mL (ref 0.00–0.08)

## 2014-06-17 LAB — RAPID URINE DRUG SCREEN, HOSP PERFORMED
AMPHETAMINES: NOT DETECTED
BARBITURATES: NOT DETECTED
Benzodiazepines: NOT DETECTED
Cocaine: POSITIVE — AB
Opiates: NOT DETECTED
Tetrahydrocannabinol: POSITIVE — AB

## 2014-06-17 LAB — TROPONIN I: Troponin I: <0.03 ng/mL (ref <0.031)

## 2014-06-17 NOTE — ED Notes (Signed)
Patient transported to CT 

## 2014-06-17 NOTE — Discharge Instructions (Signed)
Confusion Confusion is the inability to think with your usual speed or clarity. Confusion may come on quickly or slowly over time. How quickly the confusion comes on depends on the cause. Confusion can be due to any number of causes. CAUSES   Concussion, head injury, or head trauma.  Seizures.  Stroke.  Fever.  Brain tumor.  Age related decreased brain function (dementia).  Heightened emotional states like rage or terror.  Mental illness in which the person loses the ability to determine what is real and what is not (hallucinations).  Infections such as a urinary tract infection (UTI).  Toxic effects from alcohol, drugs, or prescription medicines.  Dehydration and an imbalance of salts in the body (electrolytes).  Lack of sleep.  Low blood sugar (diabetes).  Low levels of oxygen from conditions such as chronic lung disorders.  Drug interactions or other medicine side effects.  Nutritional deficiencies, especially niacin, thiamine, vitamin C, or vitamin B.  Sudden drop in body temperature (hypothermia).  Change in routine, such as when traveling or hospitalized. SIGNS AND SYMPTOMS  People often describe their thinking as cloudy or unclear when they are confused. Confusion can also include feeling disoriented. That means you are unaware of where or who you are. You may also not know what the date or time is. If confused, you may also have difficulty paying attention, remembering, and making decisions. Some people also act aggressively when they are confused.  DIAGNOSIS  The medical evaluation of confusion may include:  Blood and urine tests.  X-rays.  Brain and nervous system tests.  Analyzing your brain waves (electroencephalogram or EEG).  Magnetic resonance imaging (MRI) of your head.  Computed tomography (CT) scan of your head.  Mental status tests in which your health care provider may ask many questions. Some of these questions may seem silly or strange,  but they are a very important test to help diagnose and treat confusion. TREATMENT  An admission to the hospital may not be needed, but a person with confusion should not be left alone. Stay with a family member or friend until the confusion clears. Avoid alcohol, pain relievers, or sedative drugs until you have fully recovered. Do not drive until directed by your health care provider. HOME CARE INSTRUCTIONS  What family and friends can do:  To find out if someone is confused, ask the person to state his or her name, age, and the date. If the person is unsure or answers incorrectly, he or she is confused.  Always introduce yourself, no matter how well the person knows you.  Often remind the person of his or her location.  Place a calendar and clock near the confused person.  Help the person with his or her medicines. You may want to use a pill box, an alarm as a reminder, or give the person each dose as prescribed.  Talk about current events and plans for the day.  Try to keep the environment calm, quiet, and peaceful.  Make sure the person keeps follow-up visits with his or her health care provider. PREVENTION  Ways to prevent confusion:  Avoid alcohol.  Eat a balanced diet.  Get enough sleep.  Take medicine only as directed by your health care provider.  Do not become isolated. Spend time with other people and make plans for your days.  Keep careful watch on your blood sugar levels if you are diabetic. SEEK IMMEDIATE MEDICAL CARE IF:   You develop severe headaches, repeated vomiting, seizures, blackouts, or   slurred speech.  There is increasing confusion, weakness, numbness, restlessness, or personality changes.  You develop a loss of balance, have marked dizziness, feel uncoordinated, or fall.  You have delusions, hallucinations, or develop severe anxiety.  Your family members think you need to be rechecked. Document Released: 07/06/2004 Document Revised: 10/13/2013  Document Reviewed: 07/04/2013 Exit[employer] Patient Information 2015 [employer]. This information is not intended to replace advice given to you by your health care provider. Make sure you discuss any questions you have with your health care provider.  

## 2014-06-17 NOTE — ED Notes (Signed)
Pt in stating that he thinks he passed out earlier, states his family had a hard time waking him up, reports generalized illness over the last few days, body aches and fatigue, also reports cough and chest pain, no distress noted

## 2014-06-17 NOTE — ED Provider Notes (Signed)
CSN: [id number]     Arrival date & time 06/17/14  1703 History   First MD Initiated Contact with Patient 06/17/14 1832     Chief Complaint  Patient presents with  . Loss of Consciousness  . Generalized Body Aches     (Consider location/radiation/quality/duration/timing/severity/associated sxs/prior Treatment) Patient is a 39 y.o. male presenting with altered mental status.  Altered Mental Status Presenting symptoms: partial responsiveness (drowsiness)   Severity:  Moderate Most recent episode:  Today Episode history:  Multiple Duration:  1 day Timing:  Constant Progression:  Resolved Chronicity:  New Context: not a recent illness   Context comment:  HIV Associated symptoms: no abdominal pain, no decreased appetite, no fever, no light-headedness, no nausea, no visual change and no vomiting     Past Medical History  Diagnosis Date  . HIV infection    History reviewed. No pertinent past surgical history. Family History  Problem Relation Age of Onset  . Diabetes Mother   . Hypertension Mother   . Arthritis Mother    History  Substance Use Topics  . Smoking status: Current Every Day Smoker -- 0.50 packs/day for 25 years    Types: Cigarettes  . Smokeless tobacco: Never Used     Comment: cutting back  . Alcohol Use: 1.0 oz/week    2 drink(s) per week     Comment: occasional    Review of Systems  Constitutional: Negative for fever and decreased appetite.  Gastrointestinal: Negative for nausea, vomiting and abdominal pain.  Neurological: Negative for light-headedness.  All other systems reviewed and are negative.     Allergies  Review of patient's allergies indicates no known allergies.  Home Medications   Prior to Admission medications   Medication Sig Start Date End Date Taking? Authorizing Provider  dronabinol (MARINOL) 10 MG capsule Take 10 mg by mouth 2 (two) times daily before a meal. 02/12/14  Yes Gardiner Barefootobert W Comer, MD   elvitegravir-cobicistat-emtricitabine-tenofovir (STRIBILD) 150-150-200-300 MG TABS tablet Take 1 tablet by mouth daily with breakfast. 11/04/13  Yes Randall Hissornelius N Van Dam, MD  Emollient (EUCERIN) lotion Apply topically as needed for dry skin. 01/27/14  Yes Gardiner Barefootobert W Comer, MD  triamcinolone ointment (KENALOG) 0.5 % Apply 1 application topically 2 (two) times daily. 01/27/14  Yes Gardiner Barefootobert W Comer, MD   BP 114/79 mmHg  Pulse 90  Temp(Src) 98.8 F (37.1 C) (Oral)  Resp 21  SpO2 96% Physical Exam  Constitutional: He is oriented to person, place, and time. He appears well-developed and well-nourished.  HENT:  Head: Normocephalic and atraumatic.  Eyes: Conjunctivae and EOM are normal.  Neck: Normal range of motion. Neck supple.  Cardiovascular: Normal rate, regular rhythm and normal heart sounds.   Pulmonary/Chest: Effort normal and breath sounds normal. No respiratory distress.  Abdominal: He exhibits no distension. There is no tenderness. There is no rebound and no guarding.  Musculoskeletal: Normal range of motion.  Neurological: He is alert and oriented to person, place, and time. He has normal strength and normal reflexes. No cranial nerve deficit or sensory deficit. Gait normal. GCS eye subscore is 4. GCS verbal subscore is 5. GCS motor subscore is 6.  Skin: Skin is warm and dry.  Vitals reviewed.   ED Course  Procedures (including critical care time) Labs Review Labs Reviewed  URINE RAPID DRUG SCREEN (HOSP PERFORMED) - Abnormal; Notable for the following:    Cocaine POSITIVE (*)    Tetrahydrocannabinol POSITIVE (*)    All other components within normal limits  CBC  BASIC METABOLIC PANEL  ETHANOL  TROPONIN I  Rosezena Sensor, ED    Imaging Review Dg Chest 2 View  06/17/2014   CLINICAL DATA:  Fall. Loss of consciousness. Body aches. HIV. Tobacco use.  EXAM: CHEST  2 VIEW  COMPARISON:  03/13/2014  FINDINGS: Old healed left rib fractures. Cardiac and mediastinal margins appear  normal. The lungs appear clear. No pleural effusion.  IMPRESSION: 1. No significant radiographic abnormality.   Electronically Signed   By: Herbie Baltimore M.D.   On: 06/17/2014 20:03   Ct Head Wo Contrast  06/17/2014   CLINICAL DATA:  Acute onset of dizziness and drowsiness. Initial encounter.  EXAM: CT HEAD WITHOUT CONTRAST  TECHNIQUE: Contiguous axial images were obtained from the base of the skull through the vertex without intravenous contrast.  COMPARISON:  None.  FINDINGS: There is no evidence of acute infarction, mass lesion, or intra- or extra-axial hemorrhage on CT.  The posterior fossa, including the cerebellum, brainstem and fourth ventricle, is within normal limits. The third and lateral ventricles, and basal ganglia are unremarkable in appearance. The cerebral hemispheres are symmetric in appearance, with normal gray-white differentiation. No mass effect or midline shift is seen.  There is no evidence of fracture; visualized osseous structures are unremarkable in appearance. The visualized portions of the orbits are within normal limits. The paranasal sinuses and mastoid air cells are well-aerated. No significant soft tissue abnormalities are seen.  IMPRESSION: Unremarkable noncontrast CT of the head.   Electronically Signed   By: Roanna Raider M.D.   On: 06/17/2014 22:11     EKG Interpretation   Date/Time:  Wednesday June 17 2014 17:10:50 EST Ventricular Rate:  92 PR Interval:  126 QRS Duration: 72 QT Interval:  338 QTC Calculation: 417 R Axis:   87 Text Interpretation:  Normal sinus rhythm Right atrial enlargement Left  ventricular hypertrophy Cannot rule out Septal infarct , age undetermined  Abnormal ECG rate has decreased since last tracing Confirmed by Mirian Mo [phone]) on 06/17/2014 6:39:48 PM      MDM   Final diagnoses:  Drowsiness    39 y.o. male with pertinent PMH of HIV presents with drowsiness and difficulty awakening throughout the course of the day.   On arrival today pt has no complaint, has normal vitals as above.  WU unremarkable.  Unknown etiology of symptoms, however pt denies lateralizing neuro symptoms, frank syncope or unresponsiveness, and is well appearing.  DC home in stable condition with standard return precautions and PCP fu.    I have reviewed all laboratory and imaging studies if ordered as above  1. Drowsiness         Mirian Mo, MD 06/17/14 [phone]

## 2014-06-18 ENCOUNTER — Other Ambulatory Visit (INDEPENDENT_AMBULATORY_CARE_PROVIDER_SITE_OTHER): Payer: Self-pay

## 2014-06-18 ENCOUNTER — Emergency Department (HOSPITAL_COMMUNITY)
Admission: EM | Admit: 2014-06-18 | Discharge: 2014-06-18 | Disposition: A | Discharge status: Home / Self Care | Payer: Self-pay

## 2014-06-18 ENCOUNTER — Other Ambulatory Visit: Payer: Self-pay | Admitting: *Deleted

## 2014-06-18 DIAGNOSIS — B20 Human immunodeficiency virus [HIV] disease: Secondary | ICD-10-CM

## 2014-06-18 NOTE — ED Notes (Signed)
Pt called with no answer

## 2014-06-19 LAB — T-HELPER CELL (CD4) - (RCID CLINIC ONLY)
CD4 T CELL ABS: 40 /uL — AB (ref 400–2700)
CD4 T CELL HELPER: 6 % — AB (ref 33–55)

## 2014-06-20 LAB — HIV-1 RNA QUANT-NO REFLEX-BLD
HIV 1 RNA Quant: 29286 copies/mL — ABNORMAL HIGH (ref <20)
HIV-1 RNA QUANT, LOG: 4.47 {Log} — AB (ref <1.30)

## 2014-06-21 ENCOUNTER — Other Ambulatory Visit: Payer: Self-pay | Admitting: Internal Medicine

## 2014-07-07 ENCOUNTER — Ambulatory Visit: Payer: Self-pay

## 2014-07-07 ENCOUNTER — Ambulatory Visit: Payer: Self-pay | Admitting: Internal Medicine

## 2014-08-25 NOTE — Progress Notes (Signed)
Patient ID: Kurt Foley, male   DOB: 1976-03-20, 40 y.o.   MRN: [id number] CM met ct today. Cm reminded ct of appt with Comer on 08-31-14. Ct reports no missed med doses in the last few weeks. Cl reports no drug usage in the past few weeks. Ct relapsed a few months ago, crack cocaine and marijuana use. Ct reported depression and several missed med doses during that time. Ct is ready to move forward with positive lifestyle changes. Cm referred ct to [facility] for [facility] for assistance with treatment adherence. Ct preferred [facility] over counseling and reports drug usage is not an issue. Cm and ct are working on employment and housing.

## 2014-08-31 ENCOUNTER — Encounter: Payer: Self-pay | Admitting: Internal Medicine

## 2014-08-31 ENCOUNTER — Ambulatory Visit (INDEPENDENT_AMBULATORY_CARE_PROVIDER_SITE_OTHER): Payer: Self-pay | Admitting: Internal Medicine

## 2014-08-31 VITALS — BP 127/87 | HR 92 | Temp 97.8°F | Ht 65.5 in | Wt 120.0 lb

## 2014-08-31 DIAGNOSIS — B20 Human immunodeficiency virus [HIV] disease: Secondary | ICD-10-CM

## 2014-08-31 DIAGNOSIS — Z23 Encounter for immunization: Secondary | ICD-10-CM

## 2014-08-31 NOTE — Assessment & Plan Note (Addendum)
I am concerned with resistance though may have just been poor compliance the time of his last labs. I will check a genotype today and if there are any concerns will have him call back otherwise I will see him again in 3 months.  Again I emphasized the need for strict compliance.

## 2014-08-31 NOTE — Progress Notes (Signed)
   Subjective:    Patient ID: Kurt Foley, male    DOB: 1975-10-13, 39 y.o.   MRN: [id number]  HPI Here for follow up of 042.  Was initially diagnosed around 1995 and has been on previous regimens with nevaripine, Kaletra and others he is unable to remember and most recently started Stribild last year while in [place].    Initial CD4 <10 and viral load of 196,139 and started Stribild through pt assisstance, azithromycin 1200 mg weekly and bactrim DS 1 daily and taking all.  CD 4 went up to 40 and viral load undetectable but then in January viral load was back up again to [address].  He states he was missing 'some' doses then but back on it.     Gaining weight with marinol.      Review of Systems  Constitutional: Negative for fatigue and unexpected weight change.  HENT: Negative for trouble swallowing.   Gastrointestinal: Negative for nausea, abdominal pain and diarrhea.  Skin: Positive for rash.  Neurological: Negative for dizziness, light-headedness and headaches.  Hematological: Negative for adenopathy.  Psychiatric/Behavioral: Negative for sleep disturbance.       Objective:   Physical Exam  Constitutional: He appears well-developed and well-nourished. No distress.  HENT:  Mouth/Throat: No oropharyngeal exudate.  Eyes: Right eye exhibits no discharge. Left eye exhibits no discharge. No scleral icterus.  Cardiovascular: Normal rate, regular rhythm and normal heart sounds.   No murmur heard. Pulmonary/Chest: Effort normal and breath sounds normal. No respiratory distress. He has no wheezes.  Musculoskeletal: He exhibits no edema.  Lymphadenopathy:    He has no cervical adenopathy.          Assessment & Plan:

## 2014-09-03 LAB — HIV-1 RNA ULTRAQUANT REFLEX TO GENTYP+
HIV 1 RNA QUANT: 24215 {copies}/mL — AB (ref <20)
HIV-1 RNA QUANT, LOG: 4.38 {Log} — AB (ref <1.30)

## 2014-09-13 LAB — HIV-1 INTEGRASE GENOTYPE

## 2014-09-14 ENCOUNTER — Telehealth: Payer: Self-pay | Admitting: Licensed Clinical Social Worker

## 2014-09-14 ENCOUNTER — Telehealth: Payer: Self-pay | Admitting: *Deleted

## 2014-09-14 ENCOUNTER — Other Ambulatory Visit: Payer: Self-pay | Admitting: Licensed Clinical Social Worker

## 2014-09-14 NOTE — Telephone Encounter (Signed)
error 

## 2014-09-14 NOTE — Telephone Encounter (Signed)
Patient notified to stop the stribild, he has resistance. He was scheduled for an appointment with Dr. Luciana Axeomer on 09/22/14 to discuss a new regimen. Kurt MolaJacqueline Foley

## 2014-09-14 NOTE — Telephone Encounter (Signed)
Left patient message on voice mail to call the office, he needs to make an appointment to discuss different treatment option. Patient needs to stop Stribild per Dr. Luciana Axeomer

## 2014-09-14 NOTE — Telephone Encounter (Signed)
-----   Message from Robert W Comer, MD sent at 09/14/2014 10:02 AM EDT ----- He is resistant to his Stribild.  Tell him to stop it now and come back in to discuss a new regimen asap.  Thanks  ----- Message -----    From: Lab in [facility]    Sent: 09/03/2014   9:14 AM      To: Robert W Comer, MD    

## 2014-09-14 NOTE — Telephone Encounter (Signed)
-----   Message from Gardiner Barefootobert W Comer, MD sent at 09/14/2014 10:02 AM EDT ----- He is resistant to his Stribild.  Tell him to stop it now and come back in to discuss a new regimen asap.  Thanks  ----- Message -----    From: Lab [facility]    Sent: 09/03/2014   9:14 AM      To: Gardiner Barefootobert W Comer, MD

## 2014-09-18 LAB — HIV-1 GENOTYPR PLUS

## 2014-09-19 ENCOUNTER — Other Ambulatory Visit: Payer: Self-pay | Admitting: Internal Medicine

## 2014-09-22 ENCOUNTER — Ambulatory Visit (INDEPENDENT_AMBULATORY_CARE_PROVIDER_SITE_OTHER): Payer: Self-pay | Admitting: Internal Medicine

## 2014-09-22 ENCOUNTER — Encounter: Payer: Self-pay | Admitting: Internal Medicine

## 2014-09-22 VITALS — BP 98/68 | HR 93 | Temp 98.2°F | Wt 119.0 lb

## 2014-09-22 DIAGNOSIS — L209 Atopic dermatitis, unspecified: Secondary | ICD-10-CM

## 2014-09-22 DIAGNOSIS — H539 Unspecified visual disturbance: Secondary | ICD-10-CM

## 2014-09-22 DIAGNOSIS — B20 Human immunodeficiency virus [HIV] disease: Secondary | ICD-10-CM

## 2014-09-22 MED ORDER — EUCERIN EX LOTN: TOPICAL_LOTION | CUTANEOUS | Status: DC | PRN

## 2014-09-22 MED ORDER — ABACAVIR-DOLUTEGRAVIR-LAMIVUD 600-50-300 MG PO TABS: 1.0000 | ORAL_TABLET | Freq: Every day | ORAL | Status: DC

## 2014-09-22 MED ORDER — TRIAMCINOLONE ACETONIDE 0.5 % EX OINT: 1.0000 "application " | TOPICAL_OINTMENT | Freq: Two times a day (BID) | CUTANEOUS | Status: DC

## 2014-09-22 MED ORDER — DARUNAVIR-COBICISTAT 800-150 MG PO TABS: 1.0000 | ORAL_TABLET | Freq: Every day | ORAL | Status: DC

## 2014-09-22 NOTE — Assessment & Plan Note (Signed)
Still having active eczema. I'll try topical steroids and Eucerin lotion.

## 2014-09-22 NOTE — Progress Notes (Signed)
   Subjective:    Patient ID: Kurt Foley, male    DOB: Dec 22, 1975, 39 y.o.   MRN: [id number]  HPI He is here for follow-up of HIV. I saw him several weeks ago after falling out of care the recent CD4 of 40 but he had restarted his Stribild on his own prior to coming to the appointment. He told me that he had missed doses off and on and had stopped and started a couple times. I then checked his labs including a genotype and integrates inhibitor which did show resistance. The integrates inhibitor assay shows resistance to elvitegravir in his and RTI assay is still pending, having been sent to referral lab since they were unable to do it at [facility]. He is in here for follow-up. His repeat viral load on the medication with resistance was 24,000 with a baseline of 196,000.  Has unprotected sex with partner.     Review of Systems  Constitutional: Negative for fever and fatigue.  HENT: Negative for trouble swallowing.   Eyes: Negative for visual disturbance.  Respiratory: Negative for cough and shortness of breath.   Gastrointestinal: Negative for nausea and diarrhea.  Musculoskeletal: Negative for myalgias.  Skin: Negative for rash.  Neurological: Negative for dizziness and light-headedness.  Hematological: Negative for adenopathy.  Psychiatric/Behavioral: Positive for dysphoric mood. Negative for suicidal ideas.       Objective:   Physical Exam  Constitutional: He appears well-developed and well-nourished. No distress.  HENT:  Mouth/Throat: No oropharyngeal exudate.  Eyes: No scleral icterus.  Cardiovascular: Normal rate, regular rhythm and normal heart sounds.   No murmur heard. Pulmonary/Chest: Effort normal and breath sounds normal. No respiratory distress. He has no wheezes.  Abdominal: Soft. There is no tenderness.  Lymphadenopathy:    He has no cervical adenopathy.  Skin: Rash noted.  Dry scaly patchy rash on his arms and upper back  Psychiatric: He has a normal mood and  affect.          Assessment & Plan:

## 2014-09-22 NOTE — Assessment & Plan Note (Signed)
No current issues

## 2014-09-22 NOTE — Assessment & Plan Note (Signed)
I discussed the different possible regimens and since we don't have the nucleoside genotype yet I will start him on a regimen of Prezcobix along with Triumeq. That will give him 3 active medications including Tivicay, Prezista and likely 1 if not 2 nucleoside's. Once I get the genotype I can then streamline his regimen as indicated to Prezcobix with Truvada or Epzicom or if there is some baseline resistance will continue with this regimen. Since his CD4 is only 40, I am hesitant to wait until the genotype returns which will still be another 2 weeks. I discussed with him at length the need to be compliant from here on out so that he does not end up with a twice a day regimen or worse. Voiced his understanding.  Also I discussed with him the need to use condoms to protect himself and others.

## 2014-09-28 LAB — HIV-1 GENOTYPE: HIV-1 GENOTYPE: DETECTED

## 2014-10-22 ENCOUNTER — Other Ambulatory Visit (INDEPENDENT_AMBULATORY_CARE_PROVIDER_SITE_OTHER): Payer: Self-pay

## 2014-10-22 DIAGNOSIS — B20 Human immunodeficiency virus [HIV] disease: Secondary | ICD-10-CM

## 2014-10-22 LAB — CBC WITH DIFFERENTIAL/PLATELET
Basophils Absolute: 0 10*3/uL (ref 0.0–0.1)
Basophils Relative: 0 % (ref 0–1)
Eosinophils Absolute: 0.3 10*3/uL (ref 0.0–0.7)
Eosinophils Relative: 4 % (ref 0–5)
HCT: 41.6 % (ref 39.0–52.0)
Hemoglobin: 13.8 g/dL (ref 13.0–17.0)
LYMPHS PCT: 12 % (ref 12–46)
Lymphs Abs: 0.9 10*3/uL (ref 0.7–4.0)
MCH: 31.3 pg (ref 26.0–34.0)
MCHC: 33.2 g/dL (ref 30.0–36.0)
MCV: 94.3 fL (ref 78.0–100.0)
MONO ABS: 0.5 10*3/uL (ref 0.1–1.0)
MONOS PCT: 6 % (ref 3–12)
MPV: 9.2 fL (ref 8.6–12.4)
Neutro Abs: 6.1 10*3/uL (ref 1.7–7.7)
Neutrophils Relative %: 78 % — ABNORMAL HIGH (ref 43–77)
Platelets: 230 10*3/uL (ref 150–400)
RBC: 4.41 MIL/uL (ref 4.22–5.81)
RDW: 15.2 % (ref 11.5–15.5)
WBC: 7.8 10*3/uL (ref 4.0–10.5)

## 2014-10-22 LAB — COMPLETE METABOLIC PANEL WITH GFR
ALK PHOS: 88 U/L (ref 39–117)
ALT: 12 U/L (ref 0–53)
AST: 22 U/L (ref 0–37)
Albumin: 3.8 g/dL (ref 3.5–5.2)
BILIRUBIN TOTAL: 0.3 mg/dL (ref 0.2–1.2)
BUN: 10 mg/dL (ref 6–23)
CO2: 24 mEq/L (ref 19–32)
Calcium: 9.2 mg/dL (ref 8.4–10.5)
Chloride: 104 mEq/L (ref 96–112)
Creat: 1.08 mg/dL (ref 0.50–1.35)
GFR, Est African American: >89 mL/min
GFR, Est Non African American: 85 mL/min
Glucose, Bld: 99 mg/dL (ref 70–99)
Potassium: 4.1 mEq/L (ref 3.5–5.3)
Sodium: 140 mEq/L (ref 135–145)
Total Protein: 7.2 g/dL (ref 6.0–8.3)

## 2014-10-23 ENCOUNTER — Other Ambulatory Visit: Payer: Self-pay | Admitting: Internal Medicine

## 2014-10-23 LAB — T-HELPER CELL (CD4) - (RCID CLINIC ONLY)
CD4 T CELL HELPER: 8 % — AB (ref 33–55)
CD4 T Cell Abs: 70 /uL — ABNORMAL LOW (ref 400–2700)

## 2014-10-24 LAB — HIV-1 RNA QUANT-NO REFLEX-BLD
HIV 1 RNA QUANT: 60 {copies}/mL — AB (ref <20)
HIV-1 RNA Quant, Log: 1.78 {Log} — ABNORMAL HIGH (ref <1.30)

## 2014-11-05 ENCOUNTER — Ambulatory Visit: Payer: Self-pay | Admitting: Internal Medicine

## 2014-11-22 ENCOUNTER — Other Ambulatory Visit: Payer: Self-pay | Admitting: Internal Medicine

## 2014-12-02 ENCOUNTER — Telehealth: Payer: Self-pay | Admitting: *Deleted

## 2014-12-02 NOTE — Telephone Encounter (Signed)
Patient called requesting to be seen as a walk in for "personal issues". Advised that Dr. Luciana Axe is not in today; but he could walk in tomorrow between 9 and 11 AM. He has had 3 no shows. Kurt Foley

## 2014-12-29 ENCOUNTER — Telehealth: Payer: Self-pay | Admitting: *Deleted

## 2014-12-29 NOTE — Telephone Encounter (Signed)
Patient called asking to make an appointment, stating he knows he "has missed a few."  Earliest available for Dr. Luciana Axeomer in 9/6.  Patient accepted that appointment, will get labs 8/23.

## 2015-02-02 ENCOUNTER — Other Ambulatory Visit (INDEPENDENT_AMBULATORY_CARE_PROVIDER_SITE_OTHER): Payer: Self-pay

## 2015-02-02 DIAGNOSIS — B20 Human immunodeficiency virus [HIV] disease: Secondary | ICD-10-CM

## 2015-02-02 LAB — CBC WITH DIFFERENTIAL/PLATELET
BASOS ABS: 0 10*3/uL (ref 0.0–0.1)
BASOS PCT: 0 % (ref 0–1)
Eosinophils Absolute: 0.5 10*3/uL (ref 0.0–0.7)
Eosinophils Relative: 7 % — ABNORMAL HIGH (ref 0–5)
HEMATOCRIT: 40.1 % (ref 39.0–52.0)
HEMOGLOBIN: 13.4 g/dL (ref 13.0–17.0)
LYMPHS PCT: 22 % (ref 12–46)
Lymphs Abs: 1.5 10*3/uL (ref 0.7–4.0)
MCH: 32.4 pg (ref 26.0–34.0)
MCHC: 33.4 g/dL (ref 30.0–36.0)
MCV: 96.9 fL (ref 78.0–100.0)
MONO ABS: 0.7 10*3/uL (ref 0.1–1.0)
MPV: 9.6 fL (ref 8.6–12.4)
Monocytes Relative: 10 % (ref 3–12)
NEUTROS PCT: 61 % (ref 43–77)
Neutro Abs: 4.1 10*3/uL (ref 1.7–7.7)
Platelets: 236 10*3/uL (ref 150–400)
RBC: 4.14 MIL/uL — ABNORMAL LOW (ref 4.22–5.81)
RDW: 14.7 % (ref 11.5–15.5)
WBC: 6.8 10*3/uL (ref 4.0–10.5)

## 2015-02-02 LAB — COMPLETE METABOLIC PANEL WITH GFR
ALBUMIN: 3.9 g/dL (ref 3.6–5.1)
ALK PHOS: 86 U/L (ref 40–115)
ALT: 14 U/L (ref 9–46)
AST: 25 U/L (ref 10–40)
BUN: 13 mg/dL (ref 7–25)
CALCIUM: 9.3 mg/dL (ref 8.6–10.3)
CHLORIDE: 103 mmol/L (ref 98–110)
CO2: 24 mmol/L (ref 20–31)
Creat: 1.15 mg/dL (ref 0.60–1.35)
GFR, EST NON AFRICAN AMERICAN: 79 mL/min (ref >=60)
GFR, Est African American: >89 mL/min (ref >=60)
Glucose, Bld: 83 mg/dL (ref 65–99)
Potassium: 4.3 mmol/L (ref 3.5–5.3)
Sodium: 138 mmol/L (ref 135–146)
Total Bilirubin: 0.3 mg/dL (ref 0.2–1.2)
Total Protein: 7 g/dL (ref 6.1–8.1)

## 2015-02-03 ENCOUNTER — Emergency Department (INDEPENDENT_AMBULATORY_CARE_PROVIDER_SITE_OTHER)
Admission: EM | Admit: 2015-02-03 | Discharge: 2015-02-03 | Disposition: A | Discharge status: Home / Self Care | Payer: Self-pay | Source: Home / Self Care | Attending: Family Medicine | Admitting: Family Medicine

## 2015-02-03 ENCOUNTER — Encounter (HOSPITAL_COMMUNITY): Payer: Self-pay | Admitting: *Deleted

## 2015-02-03 DIAGNOSIS — K5120 Ulcerative (chronic) proctitis without complications: Secondary | ICD-10-CM

## 2015-02-03 LAB — HIV-1 RNA QUANT-NO REFLEX-BLD: HIV-1 RNA Quant, Log: <1.3 {Log} (ref <1.30)

## 2015-02-03 LAB — T-HELPER CELL (CD4) - (RCID CLINIC ONLY)
CD4 T CELL ABS: 110 /uL — AB (ref 400–2700)
CD4 T CELL HELPER: 8 % — AB (ref 33–55)

## 2015-02-03 MED ORDER — HYDROCORTISONE 2.5 % RE CREA: 1.0000 "application " | TOPICAL_CREAM | Freq: Two times a day (BID) | RECTAL | Status: DC

## 2015-02-03 NOTE — Discharge Instructions (Signed)
Use medicine as needed and see your doctor if further problems °

## 2015-02-03 NOTE — ED Provider Notes (Signed)
CSN: [id number]     Arrival date & time 02/03/15  1324 History   First MD Initiated Contact with Patient 02/03/15 1350     Chief Complaint  Patient presents with  . Rectal Problems   (Consider location/radiation/quality/duration/timing/severity/associated sxs/prior Treatment) Patient is a 39 y.o. male presenting with rash. The history is provided by the patient.  Rash Location:  Ano-genital Ano-genital rash location:  Anus Quality: itchiness   Quality: not blistering, not draining, not painful and not red   Severity:  Mild Onset quality:  Gradual Duration:  2 days Progression:  Unchanged Chronicity:  New Relieved by:  None tried Worsened by:  Nothing tried Ineffective treatments:  None tried Associated symptoms comment:  No pain, no rectal bleeding, no swelling, no adenopathy.   Past Medical History  Diagnosis Date  . HIV infection    History reviewed. No pertinent past surgical history. Family History  Problem Relation Age of Onset  . Diabetes Mother   . Hypertension Mother   . Arthritis Mother    Social History  Substance Use Topics  . Smoking status: Current Every Day Smoker -- 0.50 packs/day for 25 years    Types: Cigarettes  . Smokeless tobacco: Never Used     Comment: cutting back  . Alcohol Use: 1.2 oz/week    2 Standard drinks or equivalent per week     Comment: occasional    Review of Systems  Constitutional: Negative.   Gastrointestinal: Negative.   Skin: Positive for rash.    Allergies  Review of patient's allergies indicates no known allergies.  Home Medications   Prior to Admission medications   Medication Sig Start Date End Date Taking? Authorizing Provider  Abacavir-Dolutegravir-Lamivud 600-50-300 MG TABS Take 1 tablet by mouth daily. 09/22/14   [name], MD  azithromycin (ZITHROMAX) 600 MG tablet 600 mg. 08/14/14   Historical Provider, MD  darunavir-cobicistat (PREZCOBIX) 800-150 MG per tablet Take 1 tablet by mouth daily. Swallow  whole. Do NOT crush, break or chew tablets. Take with food. 09/22/14   [name], MD  dronabinol (MARINOL) 10 MG capsule TAKE ONE CAPSULE BY MOUTH TWICE DAILY BEFORE MEALS 10/23/14   Ginnie Smart, MD  Emollient (EUCERIN) lotion Apply topically as needed for dry skin. 09/22/14   [name], MD  hydrocortisone (ANUSOL-HC) 2.5 % rectal cream Place 1 application rectally 2 (two) times daily. 02/03/15   Linna Hoff, MD  sulfamethoxazole-trimethoprim (BACTRIM DS,SEPTRA DS) 800-160 MG per tablet  08/14/14   Historical Provider, MD  sulfamethoxazole-trimethoprim (BACTRIM DS,SEPTRA DS) 800-160 MG per tablet TAKE 1 TABLET BY MOUTH DAILY 11/23/14   [name], MD  triamcinolone ointment (KENALOG) 0.5 % Apply 1 application topically 2 (two) times daily. 09/22/14   [name], MD   BP 105/69 mmHg  Pulse 87  Temp(Src) 97.8 F (36.6 C) (Oral)  Resp 20  SpO2 99% Physical Exam  Constitutional: He is oriented to person, place, and time. He appears well-developed and well-nourished. No distress.  Abdominal: Soft. Bowel sounds are normal.  Genitourinary:     Neurological: He is alert and oriented to person, place, and time.  Skin: Skin is warm and dry.  Nursing note and vitals reviewed.   ED Course  Procedures (including critical care time) Labs Review Labs Reviewed - No data to display  Imaging Review No results found.   MDM   1. Idiopathic proctitis, without complications        Linna Hoff, MD 02/03/15  1426 

## 2015-02-03 NOTE — ED Notes (Addendum)
Pt reports     Symptoms  Of  Anal  Problems            Pt  Was  Told  His  Partner tested  Pos    For  Herpes      Pt has   hiv

## 2015-02-16 ENCOUNTER — Ambulatory Visit: Payer: Self-pay | Admitting: Internal Medicine

## 2015-02-17 ENCOUNTER — Other Ambulatory Visit: Payer: Self-pay | Admitting: Internal Medicine

## 2015-03-19 ENCOUNTER — Emergency Department (HOSPITAL_COMMUNITY): Payer: Self-pay

## 2015-03-19 ENCOUNTER — Observation Stay (HOSPITAL_COMMUNITY)
Admission: EM | Admit: 2015-03-19 | Discharge: 2015-03-21 | Disposition: A | Discharge status: Home / Self Care | Payer: Self-pay | Attending: Internal Medicine | Admitting: Internal Medicine

## 2015-03-19 ENCOUNTER — Encounter (HOSPITAL_COMMUNITY): Payer: Self-pay | Admitting: Emergency Medicine

## 2015-03-19 DIAGNOSIS — S8001XA Contusion of right knee, initial encounter: Principal | ICD-10-CM | POA: Diagnosis present

## 2015-03-19 DIAGNOSIS — D72829 Elevated white blood cell count, unspecified: Secondary | ICD-10-CM | POA: Diagnosis present

## 2015-03-19 DIAGNOSIS — B20 Human immunodeficiency virus [HIV] disease: Secondary | ICD-10-CM | POA: Diagnosis present

## 2015-03-19 DIAGNOSIS — Y9289 Other specified places as the place of occurrence of the external cause: Secondary | ICD-10-CM | POA: Insufficient documentation

## 2015-03-19 DIAGNOSIS — R630 Anorexia: Secondary | ICD-10-CM | POA: Insufficient documentation

## 2015-03-19 DIAGNOSIS — Z79899 Other long term (current) drug therapy: Secondary | ICD-10-CM | POA: Insufficient documentation

## 2015-03-19 DIAGNOSIS — J189 Pneumonia, unspecified organism: Secondary | ICD-10-CM | POA: Diagnosis present

## 2015-03-19 DIAGNOSIS — Y9389 Activity, other specified: Secondary | ICD-10-CM | POA: Insufficient documentation

## 2015-03-19 DIAGNOSIS — B59 Pneumocystosis: Secondary | ICD-10-CM | POA: Diagnosis present

## 2015-03-19 DIAGNOSIS — Y998 Other external cause status: Secondary | ICD-10-CM | POA: Insufficient documentation

## 2015-03-19 DIAGNOSIS — Z720 Tobacco use: Secondary | ICD-10-CM | POA: Insufficient documentation

## 2015-03-19 DIAGNOSIS — W19XXXA Unspecified fall, initial encounter: Secondary | ICD-10-CM | POA: Diagnosis present

## 2015-03-19 DIAGNOSIS — W010XXA Fall on same level from slipping, tripping and stumbling without subsequent striking against object, initial encounter: Secondary | ICD-10-CM | POA: Insufficient documentation

## 2015-03-19 LAB — CBC WITH DIFFERENTIAL/PLATELET
Basophils Absolute: 0 10*3/uL (ref 0.0–0.1)
Basophils Relative: 0 %
Eosinophils Absolute: 0.1 10*3/uL (ref 0.0–0.7)
Eosinophils Relative: 1 %
HCT: 37.6 % — ABNORMAL LOW (ref 39.0–52.0)
HEMOGLOBIN: 12.8 g/dL — AB (ref 13.0–17.0)
LYMPHS ABS: 1.1 10*3/uL (ref 0.7–4.0)
Lymphocytes Relative: 8 %
MCH: 32.8 pg (ref 26.0–34.0)
MCHC: 34 g/dL (ref 30.0–36.0)
MCV: 96.4 fL (ref 78.0–100.0)
MONO ABS: 0.6 10*3/uL (ref 0.1–1.0)
MONOS PCT: 4 %
NEUTROS ABS: 13 10*3/uL — AB (ref 1.7–7.7)
NEUTROS PCT: 87 %
Platelets: 189 10*3/uL (ref 150–400)
RBC: 3.9 MIL/uL — ABNORMAL LOW (ref 4.22–5.81)
RDW: 14.1 % (ref 11.5–15.5)
WBC: 14.9 10*3/uL — ABNORMAL HIGH (ref 4.0–10.5)

## 2015-03-19 LAB — BASIC METABOLIC PANEL
Anion gap: 8 (ref 5–15)
BUN: 10 mg/dL (ref 6–20)
CALCIUM: 9.5 mg/dL (ref 8.9–10.3)
CHLORIDE: 104 mmol/L (ref 101–111)
CO2: 23 mmol/L (ref 22–32)
CREATININE: 1.06 mg/dL (ref 0.61–1.24)
GFR calc Af Amer: >60 mL/min (ref >60)
GFR calc non Af Amer: >60 mL/min (ref >60)
GLUCOSE: 97 mg/dL (ref 65–99)
Potassium: 4.1 mmol/L (ref 3.5–5.1)
Sodium: 135 mmol/L (ref 135–145)

## 2015-03-19 MED ORDER — HYDROCODONE-ACETAMINOPHEN 5-325 MG PO TABS
1.0000 | ORAL_TABLET | Freq: Once | ORAL | Status: AC
  Administered 2015-03-19: 1 via ORAL
  Filled 2015-03-19: qty 1

## 2015-03-19 MED ORDER — SODIUM CHLORIDE 0.9 % IV BOLUS (SEPSIS)
1000.0000 mL | Freq: Once | INTRAVENOUS | Status: AC
Start: 2015-03-19 — End: 2015-03-20
  Administered 2015-03-19: 1000 mL via INTRAVENOUS

## 2015-03-19 MED ORDER — IBUPROFEN 800 MG PO TABS: 800.0000 mg | ORAL_TABLET | Freq: Three times a day (TID) | ORAL | Status: DC

## 2015-03-19 NOTE — ED Notes (Signed)
Pt BIB [facility] c/o right knee pain from slipping an falling onto some rocks. No deformity noted.

## 2015-03-19 NOTE — Discharge Instructions (Signed)
Contusion °A contusion is a deep bruise. Contusions are the result of a blunt injury to tissues and muscle fibers under the skin. The injury causes bleeding under the skin. The skin overlying the contusion may turn blue, purple, or yellow. Minor injuries will give you a painless contusion, but more severe contusions may stay painful and swollen for a few weeks.  °CAUSES  °This condition is usually caused by a blow, trauma, or direct force to an area of the body. °SYMPTOMS  °Symptoms of this condition include: °· Swelling of the injured area. °· Pain and tenderness in the injured area. °· Discoloration. The area may have redness and then turn blue, purple, or yellow. °DIAGNOSIS  °This condition is diagnosed based on a physical exam and medical history. An X-ray, CT scan, or MRI may be needed to determine if there are any associated injuries, such as broken bones (fractures). °TREATMENT  °Specific treatment for this condition depends on what area of the body was injured. In general, the best treatment for a contusion is resting, icing, applying pressure to (compression), and elevating the injured area. This is often called the RICE strategy. Over-the-counter anti-inflammatory medicines may also be recommended for pain control.  °HOME CARE INSTRUCTIONS  °· Rest the injured area. °· If directed, apply ice to the injured area: °· Put ice in a plastic bag. °· Place a towel between your skin and the bag. °· Leave the ice on for 20 minutes, 2-3 times per day. °· If directed, apply light compression to the injured area using an elastic bandage. Make sure the bandage is not wrapped too tightly. Remove and reapply the bandage as directed by your health care provider. °· If possible, raise (elevate) the injured area above the level of your heart while you are sitting or lying down. °· Take over-the-counter and prescription medicines only as told by your health care provider. °SEEK MEDICAL CARE IF: °· Your symptoms do not  improve after several days of treatment. °· Your symptoms get worse. °· You have difficulty moving the injured area. °SEEK IMMEDIATE MEDICAL CARE IF:  °· You have severe pain. °· You have numbness in a hand or foot. °· Your hand or foot turns pale or cold. °  °This information is not intended to replace advice given to you by your health care provider. Make sure you discuss any questions you have with your health care provider. °  °Document Released: 03/08/2005 Document Revised: 02/17/2015 Document Reviewed: 10/14/2014 °Elsevier Interactive Patient Education ©2016 [employer] ° °Cryotherapy °Cryotherapy means treatment with cold. Ice or gel packs can be used to reduce both pain and swelling. Ice is the most helpful within the first 24 to 48 hours after an injury or flare-up from overusing a muscle or joint. Sprains, strains, spasms, burning pain, shooting pain, and aches can all be eased with ice. Ice can also be used when recovering from surgery. Ice is effective, has very few side effects, and is safe for most people to use. °PRECAUTIONS  °Ice is not a safe treatment option for people with: °· Raynaud phenomenon. This is a condition affecting small blood vessels in the extremities. Exposure to cold may cause your problems to return. °· Cold hypersensitivity. There are many forms of cold hypersensitivity, including: °¨ Cold urticaria. Red, itchy hives appear on the skin when the tissues begin to warm after being iced. °¨ Cold erythema. This is a red, itchy rash caused by exposure to cold. °¨ Cold hemoglobinuria. Red blood cells   break down when the tissues begin to warm after being iced. The hemoglobin that carry oxygen are passed into the urine because they cannot combine with blood proteins fast enough. °· Numbness or altered sensitivity in the area being iced. °If you have any of the following conditions, do not use ice until you have discussed cryotherapy with your caregiver: °· Heart conditions, such as  arrhythmia, angina, or chronic heart disease. °· High blood pressure. °· Healing wounds or open skin in the area being iced. °· Current infections. °· Rheumatoid arthritis. °· Poor circulation. °· Diabetes. °Ice slows the blood flow in the region it is applied. This is beneficial when trying to stop inflamed tissues from spreading irritating chemicals to surrounding tissues. However, if you expose your skin to cold temperatures for too long or without the proper protection, you can damage your skin or nerves. Watch for signs of skin damage due to cold. °HOME CARE INSTRUCTIONS °Follow these tips to use ice and cold packs safely. °· Place a dry or damp towel between the ice and skin. A damp towel will cool the skin more quickly, so you may need to shorten the time that the ice is used. °· For a more rapid response, add gentle compression to the ice. °· Ice for no more than 10 to 20 minutes at a time. The bonier the area you are icing, the less time it will take to get the benefits of ice. °· Check your skin after 5 minutes to make sure there are no signs of a poor response to cold or skin damage. °· Rest 20 minutes or more between uses. °· Once your skin is numb, you can end your treatment. You can test numbness by very lightly touching your skin. The touch should be so light that you do not see the skin dimple from the pressure of your fingertip. When using ice, most people will feel these normal sensations in this order: cold, burning, aching, and numbness. °· Do not use ice on someone who cannot communicate their responses to pain, such as small children or people with dementia. °HOW TO MAKE AN ICE PACK °Ice packs are the most common way to use ice therapy. Other methods include ice massage, ice baths, and cryosprays. Muscle creams that cause a cold, tingly feeling do not offer the same benefits that ice offers and should not be used as a substitute unless recommended by your caregiver. °To make an ice pack, do one  of the following: °· Place crushed ice or a bag of frozen vegetables in a sealable plastic bag. Squeeze out the excess air. Place this bag inside another plastic bag. Slide the bag into a pillowcase or place a damp towel between your skin and the bag. °· Mix 3 parts water with 1 part rubbing alcohol. Freeze the mixture in a sealable plastic bag. When you remove the mixture from the freezer, it will be slushy. Squeeze out the excess air. Place this bag inside another plastic bag. Slide the bag into a pillowcase or place a damp towel between your skin and the bag. °SEEK MEDICAL CARE IF: °· You develop white spots on your skin. This may give the skin a blotchy (mottled) appearance. °· Your skin turns blue or pale. °· Your skin becomes waxy or hard. °· Your swelling gets worse. °MAKE SURE YOU:  °· Understand these instructions. °· Will watch your condition. °· Will get help right away if you are not doing well or get worse. °  °  This information is not intended to replace advice given to you by your health care provider. Make sure you discuss any questions you have with your health care provider. °  °Document Released: 01/23/2011 Document Revised: 06/19/2014 Document Reviewed: 01/23/2011 °Elsevier Interactive Patient Education ©2016 [employer] ° °

## 2015-03-19 NOTE — ED Notes (Signed)
Bed: ZO10 Expected date:  Expected time:  Means of arrival:  Comments: t9

## 2015-03-19 NOTE — ED Notes (Signed)
MD notified of increase in temperature and of pulse rate of 100.9

## 2015-03-19 NOTE — ED Provider Notes (Signed)
History  By signing my name below, I, Karle Plumber, attest that this documentation has been prepared under the direction and in the presence of Elpidio Anis, PA-C. Electronically Signed: Karle Plumber, ED Scribe. 03/19/2015. 8:45 PM.  Chief Complaint  Patient presents with  . Knee Pain   The history is provided by the patient and medical records. No language interpreter was used.    HPI Comments:  Kurt Foley is a HIV positive 39 y.o. male brought in by EMS, who presents to the Emergency Department complaining of worsening right knee pain secondary to slipping and falling on it about two hours ago. He reports difficulty moving the right knee and mild swelling of the joint. He has not taken anything for pain. He denies alleviating factors of the pain but states moving it and walking intensifies it. He denies head trauma, LOC, bruising, wounds.  Past Medical History  Diagnosis Date  . HIV infection (HCC)    History reviewed. No pertinent past surgical history. Family History  Problem Relation Age of Onset  . Diabetes Mother   . Hypertension Mother   . Arthritis Mother    Social History  Substance Use Topics  . Smoking status: Current Every Day Smoker -- 0.50 packs/day for 25 years    Types: Cigarettes  . Smokeless tobacco: Never Used     Comment: cutting back  . Alcohol Use: 1.2 oz/week    2 Standard drinks or equivalent per week     Comment: occasional    Review of Systems  Musculoskeletal: Positive for joint swelling and arthralgias.  Skin: Negative for color change and wound.  Neurological: Negative for syncope.  All other systems reviewed and are negative.   Allergies  Review of patient's allergies indicates no known allergies.  Home Medications   Prior to Admission medications   Medication Sig Start Date End Date Taking? Authorizing Provider  Abacavir-Dolutegravir-Lamivud 600-50-300 MG TABS Take 1 tablet by mouth daily. 09/22/14   [name], MD   azithromycin (ZITHROMAX) 600 MG tablet 600 mg. 08/14/14   Historical Provider, MD  darunavir-cobicistat (PREZCOBIX) 800-150 MG per tablet Take 1 tablet by mouth daily. Swallow whole. Do NOT crush, break or chew tablets. Take with food. 09/22/14   [name], MD  dronabinol (MARINOL) 10 MG capsule TAKE ONE CAPSULE BY MOUTH TWICE DAILY BEFORE MEALS 10/23/14   Ginnie Smart, MD  Emollient (EUCERIN) lotion Apply topically as needed for dry skin. 09/22/14   [name], MD  hydrocortisone (ANUSOL-HC) 2.5 % rectal cream Place 1 application rectally 2 (two) times daily. 02/03/15   Linna Hoff, MD  sulfamethoxazole-trimethoprim (BACTRIM DS,SEPTRA DS) 800-160 MG per tablet  08/14/14   Historical Provider, MD  sulfamethoxazole-trimethoprim (BACTRIM DS,SEPTRA DS) 800-160 MG per tablet TAKE 1 TABLET BY MOUTH DAILY 11/23/14   [name], MD  triamcinolone ointment (KENALOG) 0.5 % Apply 1 application topically 2 (two) times daily. 09/22/14   [name], MD   Triage Vitals: BP 136/76 mmHg  Pulse 110  Temp(Src) 99.1 F (37.3 C) (Oral)  Resp 18  SpO2 97% Physical Exam  Constitutional: He is oriented to person, place, and time. He appears well-developed and well-nourished.  HENT:  Head: Normocephalic and atraumatic.  Eyes: EOM are normal.  Neck: Normal range of motion.  Cardiovascular: Normal rate.   Distal pulses intact.  Pulmonary/Chest: Effort normal.  Musculoskeletal: Normal range of motion. He exhibits tenderness.  Mild swelling to anterior right knee. No bruising. No  bony deformity. Knee is diffusely tender. Full ROM of the joint.  Neurological: He is alert and oriented to person, place, and time.  Skin: Skin is warm and dry.  Psychiatric: He has a normal mood and affect. His behavior is normal.  Nursing note and vitals reviewed.   ED Course  Procedures (including critical care time) DIAGNOSTIC STUDIES: Oxygen Saturation is 97% on RA, normal by my interpretation.   COORDINATION  OF CARE: 8:39 PM- Will X-Ray right knee. Pt verbalizes understanding and agrees to plan.  Medications - No data to display  Labs Review Labs Reviewed - No data to display  Imaging Review No results found. I have personally reviewed and evaluated these images and lab results as part of my medical decision-making.   EKG Interpretation None     Dg Knee Complete 4 Views Right  03/19/2015   CLINICAL DATA:  Medial knee pain after falling on loose rocks.  EXAM: RIGHT KNEE - COMPLETE 4+ VIEW  COMPARISON:  None.  FINDINGS: There is no evidence of fracture, dislocation, or joint effusion. There is no evidence of arthropathy or other focal bone abnormality. Soft tissues are unremarkable.  IMPRESSION: Negative.   Electronically Signed   By: Ellery Plunk M.D.   On: 03/19/2015 21:32    MDM   Final diagnoses:  None    1. Right knee contusion  Contusion injury to right knee without evidence fracture. He is felt stable for discharge.   I personally performed the services described in this documentation, which was scribed in my presence. The recorded information has been reviewed and is accurate.    Elpidio Anis, PA-C 03/19/15 2138  Laurence Spates, MD 03/19/15 2322

## 2015-03-20 ENCOUNTER — Emergency Department (HOSPITAL_COMMUNITY): Payer: Self-pay

## 2015-03-20 ENCOUNTER — Other Ambulatory Visit: Payer: Self-pay | Admitting: Internal Medicine

## 2015-03-20 DIAGNOSIS — B20 Human immunodeficiency virus [HIV] disease: Secondary | ICD-10-CM

## 2015-03-20 DIAGNOSIS — M25561 Pain in right knee: Secondary | ICD-10-CM

## 2015-03-20 DIAGNOSIS — B59 Pneumocystosis: Secondary | ICD-10-CM | POA: Diagnosis present

## 2015-03-20 DIAGNOSIS — R05 Cough: Secondary | ICD-10-CM

## 2015-03-20 DIAGNOSIS — J189 Pneumonia, unspecified organism: Secondary | ICD-10-CM | POA: Diagnosis present

## 2015-03-20 DIAGNOSIS — F17210 Nicotine dependence, cigarettes, uncomplicated: Secondary | ICD-10-CM

## 2015-03-20 DIAGNOSIS — D72829 Elevated white blood cell count, unspecified: Secondary | ICD-10-CM | POA: Diagnosis present

## 2015-03-20 DIAGNOSIS — W19XXXA Unspecified fall, initial encounter: Secondary | ICD-10-CM | POA: Diagnosis present

## 2015-03-20 DIAGNOSIS — R509 Fever, unspecified: Secondary | ICD-10-CM

## 2015-03-20 DIAGNOSIS — R918 Other nonspecific abnormal finding of lung field: Secondary | ICD-10-CM

## 2015-03-20 DIAGNOSIS — S8001XA Contusion of right knee, initial encounter: Secondary | ICD-10-CM | POA: Diagnosis present

## 2015-03-20 DIAGNOSIS — Z21 Asymptomatic human immunodeficiency virus [HIV] infection status: Secondary | ICD-10-CM

## 2015-03-20 LAB — BASIC METABOLIC PANEL
ANION GAP: 7 (ref 5–15)
BUN: 12 mg/dL (ref 6–20)
CHLORIDE: 102 mmol/L (ref 101–111)
CO2: 27 mmol/L (ref 22–32)
Calcium: 8.7 mg/dL — ABNORMAL LOW (ref 8.9–10.3)
Creatinine, Ser: 0.96 mg/dL (ref 0.61–1.24)
GFR calc Af Amer: >60 mL/min (ref >60)
GLUCOSE: 75 mg/dL (ref 65–99)
POTASSIUM: 3.6 mmol/L (ref 3.5–5.1)
Sodium: 136 mmol/L (ref 135–145)

## 2015-03-20 LAB — HIV ANTIBODY (ROUTINE TESTING W REFLEX): HIV Screen 4th Generation wRfx: REACTIVE — AB

## 2015-03-20 LAB — CBC
HEMATOCRIT: 37.7 % — AB (ref 39.0–52.0)
HEMOGLOBIN: 12.6 g/dL — AB (ref 13.0–17.0)
MCH: 32.6 pg (ref 26.0–34.0)
MCHC: 33.4 g/dL (ref 30.0–36.0)
MCV: 97.4 fL (ref 78.0–100.0)
Platelets: 172 10*3/uL (ref 150–400)
RBC: 3.87 MIL/uL — ABNORMAL LOW (ref 4.22–5.81)
RDW: 14.2 % (ref 11.5–15.5)
WBC: 9.5 10*3/uL (ref 4.0–10.5)

## 2015-03-20 LAB — URINALYSIS, ROUTINE W REFLEX MICROSCOPIC
Bilirubin Urine: NEGATIVE
GLUCOSE, UA: NEGATIVE mg/dL
Hgb urine dipstick: NEGATIVE
Ketones, ur: NEGATIVE mg/dL
LEUKOCYTES UA: NEGATIVE
Nitrite: NEGATIVE
PH: 7.5 (ref 5.0–8.0)
Protein, ur: NEGATIVE mg/dL
Specific Gravity, Urine: 1.016 (ref 1.005–1.030)
Urobilinogen, UA: 1 mg/dL (ref 0.0–1.0)

## 2015-03-20 LAB — STREP PNEUMONIAE URINARY ANTIGEN: STREP PNEUMO URINARY ANTIGEN: NEGATIVE

## 2015-03-20 LAB — LACTIC ACID, PLASMA
LACTIC ACID, VENOUS: 1.1 mmol/L (ref 0.5–2.0)
Lactic Acid, Venous: 1.5 mmol/L (ref 0.5–2.0)

## 2015-03-20 LAB — LACTATE DEHYDROGENASE: LDH: 220 U/L — AB (ref 98–192)

## 2015-03-20 MED ORDER — DOLUTEGRAVIR SODIUM 50 MG PO TABS
50.0000 mg | ORAL_TABLET | Freq: Every day | ORAL | Status: DC
  Administered 2015-03-20 – 2015-03-21 (×2): 50 mg via ORAL
  Filled 2015-03-20 (×2): qty 1

## 2015-03-20 MED ORDER — ACETAMINOPHEN 650 MG RE SUPP: 650.0000 mg | Freq: Four times a day (QID) | RECTAL | Status: DC | PRN

## 2015-03-20 MED ORDER — ONDANSETRON HCL 4 MG/2ML IJ SOLN: 4.0000 mg | Freq: Four times a day (QID) | INTRAMUSCULAR | Status: DC | PRN

## 2015-03-20 MED ORDER — DEXTROSE 5 % IV SOLN
1.0000 g | Freq: Once | INTRAVENOUS | Status: AC
  Administered 2015-03-20: 1 g via INTRAVENOUS
  Filled 2015-03-20: qty 10

## 2015-03-20 MED ORDER — ACETAMINOPHEN 325 MG PO TABS: 650.0000 mg | ORAL_TABLET | Freq: Four times a day (QID) | ORAL | Status: DC | PRN

## 2015-03-20 MED ORDER — OXYCODONE HCL 5 MG PO TABS
5.0000 mg | ORAL_TABLET | ORAL | Status: DC | PRN
  Filled 2015-03-20: qty 1

## 2015-03-20 MED ORDER — ABACAVIR SULFATE 300 MG PO TABS
600.0000 mg | ORAL_TABLET | Freq: Every day | ORAL | Status: DC
  Administered 2015-03-20 – 2015-03-21 (×2): 600 mg via ORAL
  Filled 2015-03-20 (×2): qty 2

## 2015-03-20 MED ORDER — DEXTROSE 5 % IV SOLN
500.0000 mg | INTRAVENOUS | Status: DC
  Administered 2015-03-21: 500 mg via INTRAVENOUS
  Filled 2015-03-20: qty 500

## 2015-03-20 MED ORDER — DEXTROSE 5 % IV SOLN
500.0000 mg | Freq: Once | INTRAVENOUS | Status: AC
  Administered 2015-03-20: 500 mg via INTRAVENOUS
  Filled 2015-03-20: qty 500

## 2015-03-20 MED ORDER — ENOXAPARIN SODIUM 30 MG/0.3ML ~~LOC~~ SOLN: 30.0000 mg | SUBCUTANEOUS | Status: DC

## 2015-03-20 MED ORDER — HYDROMORPHONE HCL 1 MG/ML IJ SOLN
0.5000 mg | INTRAMUSCULAR | Status: DC | PRN
  Administered 2015-03-20 – 2015-03-21 (×4): 1 mg via INTRAVENOUS
  Filled 2015-03-20 (×4): qty 1

## 2015-03-20 MED ORDER — ONDANSETRON HCL 4 MG PO TABS: 4.0000 mg | ORAL_TABLET | Freq: Four times a day (QID) | ORAL | Status: DC | PRN

## 2015-03-20 MED ORDER — DEXTROSE 5 % IV SOLN
1.0000 g | INTRAVENOUS | Status: DC
  Administered 2015-03-21: 1 g via INTRAVENOUS
  Filled 2015-03-20: qty 10

## 2015-03-20 MED ORDER — SULFAMETHOXAZOLE-TRIMETHOPRIM 400-80 MG/5ML IV SOLN
15.0000 mg/kg/d | Freq: Three times a day (TID) | INTRAVENOUS | Status: DC
  Filled 2015-03-20: qty 16.9

## 2015-03-20 MED ORDER — SODIUM CHLORIDE 0.9 % IV SOLN
INTRAVENOUS | Status: DC
  Administered 2015-03-20: 13:00:00 via INTRAVENOUS

## 2015-03-20 MED ORDER — OXYCODONE HCL 5 MG PO TABS: 5.0000 mg | ORAL_TABLET | ORAL | Status: DC | PRN

## 2015-03-20 MED ORDER — LAMIVUDINE 150 MG PO TABS
300.0000 mg | ORAL_TABLET | Freq: Every day | ORAL | Status: DC
  Administered 2015-03-20 – 2015-03-21 (×2): 300 mg via ORAL
  Filled 2015-03-20 (×2): qty 2

## 2015-03-20 MED ORDER — DIPHENHYDRAMINE HCL 25 MG PO CAPS
25.0000 mg | ORAL_CAPSULE | Freq: Three times a day (TID) | ORAL | Status: DC | PRN
  Administered 2015-03-20 – 2015-03-21 (×3): 25 mg via ORAL
  Filled 2015-03-20 (×3): qty 1

## 2015-03-20 MED ORDER — ALUM & MAG HYDROXIDE-SIMETH 200-200-20 MG/5ML PO SUSP: 30.0000 mL | Freq: Four times a day (QID) | ORAL | Status: DC | PRN

## 2015-03-20 MED ORDER — DARUNAVIR-COBICISTAT 800-150 MG PO TABS
1.0000 | ORAL_TABLET | Freq: Every day | ORAL | Status: DC
  Administered 2015-03-20 – 2015-03-21 (×2): 1 via ORAL
  Filled 2015-03-20 (×2): qty 1

## 2015-03-20 MED ORDER — SODIUM CHLORIDE 0.9 % IV SOLN: INTRAVENOUS | Status: DC

## 2015-03-20 MED ORDER — ONDANSETRON HCL 4 MG/2ML IJ SOLN
4.0000 mg | Freq: Four times a day (QID) | INTRAMUSCULAR | Status: DC | PRN
Start: 2015-03-20 — End: 2015-03-20

## 2015-03-20 MED ORDER — IOHEXOL 300 MG/ML  SOLN
75.0000 mL | Freq: Once | INTRAMUSCULAR | Status: AC | PRN
  Administered 2015-03-20: 75 mL via INTRAVENOUS

## 2015-03-20 MED ORDER — HYDROMORPHONE HCL 1 MG/ML IJ SOLN: 0.5000 mg | INTRAMUSCULAR | Status: DC | PRN

## 2015-03-20 MED ORDER — INFLUENZA VAC SPLIT QUAD 0.5 ML IM SUSY
0.5000 mL | PREFILLED_SYRINGE | INTRAMUSCULAR | Status: AC
  Administered 2015-03-21: 0.5 mL via INTRAMUSCULAR
  Filled 2015-03-20 (×2): qty 0.5

## 2015-03-20 MED ORDER — ENOXAPARIN SODIUM 40 MG/0.4ML ~~LOC~~ SOLN
40.0000 mg | SUBCUTANEOUS | Status: DC
  Administered 2015-03-20 – 2015-03-21 (×2): 40 mg via SUBCUTANEOUS
  Filled 2015-03-20 (×2): qty 0.4

## 2015-03-20 MED ORDER — ABACAVIR-DOLUTEGRAVIR-LAMIVUD 600-50-300 MG PO TABS: 1.0000 | ORAL_TABLET | Freq: Every day | ORAL | Status: DC

## 2015-03-20 NOTE — Consult Note (Addendum)
[facility]      Reason for Consult:? pneumonia    Referring Physician: Dr. Gwenlyn Perking  Active Problems:   Human immunodeficiency virus (HIV) disease (HCC)   PCP (pneumocystis jiroveci pneumonia) (HCC)   CAP (community acquired pneumonia)   Leukocytosis   Contusion of right knee   Fall   . abacavir  600 mg Oral Daily   And  . dolutegravir  50 mg Oral Daily   And  . lamiVUDine  300 mg Oral Daily  . [START ON 03/21/2015] azithromycin  500 mg Intravenous Q24H  . [START ON 03/21/2015] cefTRIAXone (ROCEPHIN)  IV  1 g Intravenous Q24H  . darunavir-cobicistat  1 tablet Oral Daily  . enoxaparin (LOVENOX) injection  40 mg Subcutaneous Q24H  . [START ON 03/21/2015] Influenza vac split quadrivalent PF  0.5 mL Intramuscular Tomorrow-1000    Recommendations: Azithromycin 500 mg for 2 more days Smoking cessation I will arrange follow up with me on Thursday  I will check HIV RNA (CD4 already done) I will refill Kurt Foley OK from ID standpoint for d/c  Assessment: He came in after a fall and cough was noted, fever to 100.9, leukocytosis with some neutrophil predominance and did CXR, then CT with patchy airspace opacities.  He though is not hypoxic, cough is chronic (months) and not worsening, no SOB, no increased sputum.  No consolidation.  Viral vs atypical.  This is not c/w PCP pneumonia with no hypoxia, no SOB, no dry cough.   CXR, CT personally reviewed  Antibiotics: Ceftriaxone and azithromycin  HPI: Kurt Foley is a 39 y.o. male with HIV and poor follow up but reports good compliance who came to ED after a fall and right knee pain. He was noted also to be febrile to 100.9, WBC 14 and CXR done, then CT with interstitial findings and patchy opacities.  Last CD 4 was 110 in August 2016 and he knows Kurt medications and denies any missed doses with Triumeq and Prezcobix (not on Bactrim).  He did not note any fever at home.  He was admitted with concerns  for OI with PCP vs CAP.  No hypoxia and no new symptoms, he has had a cough for months and no change in sputum.  No dypsnea.     Review of Systems: A comprehensive review of systems was negative except for: Constitutional: positive for poor appetite, which is chronic  Past Medical History  Diagnosis Date  . HIV infection [facility])     Social History  Substance Use Topics  . Smoking status: Current Every Day Smoker -- 0.50 packs/day for 25 years    Types: Cigarettes  . Smokeless tobacco: Never Used     Comment: cutting back  . Alcohol Use: 1.2 oz/week    2 Standard drinks or equivalent per week     Comment: occasional    Family History  Problem Relation Age of Onset  . Diabetes Mother   . Hypertension Mother   . Arthritis Mother    No Known Allergies  OBJECTIVE: Blood pressure 117/81, pulse 87, temperature 97.6 F (36.4 C), temperature source Oral, resp. rate 16, height  (1.676 m), weight 114 lb 6.7 oz (51.9 kg), SpO2 98 %. General: awake, alert, nad HEENT: anicteric Skin: no rashes Lungs: CTA B, no wheezes, no crackles Cor: RRR Abdomen: soft, nt, nd Ext: no edema Neuro: non-focal   Microbiology: No results found for this or any previous visit (from the past 240  hour(s)).  Staci Righter, MD [facility] [email or web address] [phone] pager  [phone] cell 03/20/2015, 10:34 AM

## 2015-03-20 NOTE — Progress Notes (Signed)
patient seen and examined. Admitted after midnight secondary to low grade fever and cough. Had hx of HIV and is actively on antiretroviral therapy. CT chest with concerns for left lung CAP vs PCP. Good O2 saturation. Patient with elevated WBC's on admission.  Plan: -will continue IV antibiotics to cover for CAP with rocephin and zithromax  -ID consulted (Dr. Luciana Axe), will follow rec's -will start flutter valve and continue prophylaxis with bactrim  -will follow culture data and PCP smear results  -follow clinical response  Vassie Loll [phone]

## 2015-03-20 NOTE — Progress Notes (Signed)
Patient arrived on the unit at approximately 0420. He is alert and verbally responsive. He c/o pain to right knee secondary to fall yesterday. Medicated x 1 with Dilaudid 1 mg with good effect. No other complaints voiced at this time.

## 2015-03-20 NOTE — H&P (Signed)
[facility] Admission History and Physical       Kurt Foley ZOX:[id number] DOB: 09-13-1975 DOA: 03/19/2015  Referring physician: EDP PCP: Staci Righter, MD  Specialists:   Chief Complaint: Low Grade Fevers and Cough, and Fall with Right Knee Pain  HPI: Kurt Foley is a 39 y.o. male with HIV Disease with last reported CD4 of 100 who presented to the ED with complaints of slipping and falling onto his right knee while walking outside.   He was seen in the ED and had X-rays performed which were negative for acute findings.  He was also found to have low grade fevers and complaints of coughing so a chest x-ray and Ct scan were perfomed and reveaeld ad LUL infiltrate.     Review of Systems:  Constitutional: No Weight Loss, No Weight Gain, Night Sweats, +Fevers, Chills, Dizziness, Light Headedness, Fatigue, or Generalized Weakness HEENT: No Headaches, Difficulty Swallowing,Tooth/Dental Problems,Sore Throat,  No Sneezing, Rhinitis, Ear Ache, Nasal Congestion, or Post Nasal Drip,  Cardio-vascular:  No Chest pain, Orthopnea, PND, Edema in Lower Extremities, Anasarca, Dizziness, Palpitations  Resp: No Dyspnea, No DOE, No Productive Cough, +Non-Productive Cough, No Hemoptysis, No Wheezing.    GI: No Heartburn, Indigestion, Abdominal Pain, Nausea, Vomiting, Diarrhea, Constipation, Hematemesis, Hematochezia, Melena, Change in Bowel Habits,  Loss of Appetite  GU: No Dysuria, No Change in Color of Urine, No Urgency or Urinary Frequency, No Flank pain.  Musculoskeletal: +Right Knee Pain, No Decreased Range of Motion, No Back Pain.  Neurologic: No Syncope, No Seizures, Muscle Weakness, Paresthesia, Vision Disturbance or Loss, No Diplopia, No Vertigo, No Difficulty Walking,  Skin: No Rash or Lesions. Psych: No Change in Mood or Affect, No Depression or Anxiety, No Memory loss, No Confusion, or Hallucinations   Past Medical History  Diagnosis Date  . HIV infection (HCC)       History reviewed. No pertinent past surgical history.    Prior to Admission medications   Medication Sig Start Date End Date Taking? Authorizing Provider  Abacavir-Dolutegravir-Lamivud 600-50-300 MG TABS Take 1 tablet by mouth daily. 09/22/14  Yes [name], MD  darunavir-cobicistat (PREZCOBIX) 800-150 MG per tablet Take 1 tablet by mouth daily. Swallow whole. Do NOT crush, break or chew tablets. Take with food. 09/22/14  Yes [name], MD  dronabinol (MARINOL) 10 MG capsule TAKE ONE CAPSULE BY MOUTH TWICE DAILY BEFORE MEALS Patient not taking: Reported on 03/19/2015 10/23/14   Ginnie Smart, MD  Emollient (EUCERIN) lotion Apply topically as needed for dry skin. Patient not taking: Reported on 03/19/2015 09/22/14   [name], MD  hydrocortisone (ANUSOL-HC) 2.5 % rectal cream Place 1 application rectally 2 (two) times daily. Patient not taking: Reported on 03/19/2015 02/03/15   Linna Hoff, MD  ibuprofen (ADVIL,MOTRIN) 800 MG tablet Take 1 tablet (800 mg total) by mouth 3 (three) times daily. 03/19/15   Elpidio Anis, PA-C  sulfamethoxazole-trimethoprim (BACTRIM DS,SEPTRA DS) 800-160 MG per tablet TAKE 1 TABLET BY MOUTH DAILY Patient not taking: Reported on 03/19/2015 11/23/14   [name], MD  triamcinolone ointment (KENALOG) 0.5 % Apply 1 application topically 2 (two) times daily. Patient not taking: Reported on 03/19/2015 09/22/14   [name], MD     No Known Allergies    Social History:  reports that he has been smoking Cigarettes.  He has a 12.5 pack-year smoking history. He has never used smokeless tobacco. He reports that he drinks about 1.2 oz of alcohol per  week. He reports that he uses illicit drugs (Marijuana).    Family History  Problem Relation Age of Onset  . Diabetes Mother   . Hypertension Mother   . Arthritis Mother        Physical Exam:  GEN:  Pleasant Thin  39 y.o. African American male examined and in no acute distress; cooperative with  exam Filed Vitals:   03/19/15 1927 03/19/15 2156 03/20/15 0217 03/20/15 0247  BP: 136/76 122/93 122/92   Pulse: 110 111 95   Temp: 99.1 F (37.3 C) 100.1 F (37.8 C) 99.3 F (37.4 C)   TempSrc: Oral  Oral   Resp: 18 20 16    Weight:    54 kg (119 lb 0.8 oz)  SpO2: 97% 100% 97%    Blood pressure 122/92, pulse 95, temperature 99.3 F (37.4 C), temperature source Oral, resp. rate 16, weight 54 kg (119 lb 0.8 oz), SpO2 97 %. PSYCH: She is alert and oriented x4; does not appear anxious does not appear depressed; affect is normal HEENT: Normocephalic and Atraumatic, Mucous membranes pink; PERRLA; EOM intact; Fundi:  Benign;  No scleral icterus, Nares: Patent, Oropharynx: Clear, Fair Dentition,    Neck:  FROM, No Cervical Lymphadenopathy nor Thyromegaly or Carotid Bruit; No JVD; Breasts:: Not examined CHEST WALL: No tenderness CHEST: Normal respiration, clear to auscultation bilaterally HEART: Regular rate and rhythm; no murmurs rubs or gallops BACK: No kyphosis or scoliosis; No CVA tenderness ABDOMEN: Positive Bowel Sounds, Scaphoid, Soft Non-Tender, No Rebound or Guarding; No Masses, No Organomegaly Rectal Exam: Not done EXTREMITIES: No Cyanosis, Clubbing, or Edema; No Ulcerations. Genitalia: not examined PULSES: 2+ and symmetric SKIN: Normal hydration no rash or ulceration CNS:  Alert and Oriented x 4, No Focal Deficits Vascular: pulses palpable throughout    Labs on Admission:  Basic Metabolic Panel:  Recent Labs Lab 03/19/15 2237  NA 135  K 4.1  CL 104  CO2 23  GLUCOSE 97  BUN 10  CREATININE 1.06  CALCIUM 9.5   Liver Function Tests: No results for input(s): AST, ALT, ALKPHOS, BILITOT, PROT, ALBUMIN in the last 168 hours. No results for input(s): LIPASE, AMYLASE in the last 168 hours. No results for input(s): AMMONIA in the last 168 hours. CBC:  Recent Labs Lab 03/19/15 2237  WBC 14.9*  NEUTROABS 13.0*  HGB 12.8*  HCT 37.6*  MCV 96.4  PLT 189   Cardiac  Enzymes: No results for input(s): CKTOTAL, CKMB, CKMBINDEX, TROPONINI in the last 168 hours.  BNP (last 3 results) No results for input(s): BNP in the last 8760 hours.  ProBNP (last 3 results) No results for input(s): PROBNP in the last 8760 hours.  CBG: No results for input(s): GLUCAP in the last 168 hours.  Radiological Exams on Admission: Dg Chest 2 View  03/19/2015   CLINICAL DATA:  Status post fall.  EXAM: CHEST  2 VIEW  COMPARISON:  06/17/2014  FINDINGS: Cardiomediastinal silhouette is normal. Mediastinal contours appear intact.  There is no evidence of focal airspace consolidation, or pleural effusion. There are mild emphysematous changes. The left superior lung has hyper lucent appearance, although no clearly defined pleural line is seen.  Osseous structures are without acute abnormality. Healed left rib fracture is seen. There is mild dextro convex scoliosis of the thoracic spine. Soft tissues are grossly normal.  IMPRESSION: Hyperlucent appearance of the left superior lung, with non visualization of the pleural surface. This may represent a pneumothorax or bullous changes. Further evaluation with right side down  decubitus radiograph may be considered to differentiate between the two.   Electronically Signed   By: Ted Mcalpine M.D.   On: 03/19/2015 22:58   Ct Chest W Contrast  03/20/2015   CLINICAL DATA:  Immunocompromised patient, lucent LEFT lung apex after fall, here for further evaluation.  EXAM: CT CHEST WITH CONTRAST  TECHNIQUE: Multidetector CT imaging of the chest was performed during intravenous contrast administration.  CONTRAST:  75mL OMNIPAQUE IOHEXOL 300 MG/ML  SOLN  COMPARISON:  Chest radiograph March 19, 2015  FINDINGS: Mediastinum: The heart size is normal. No pericardial collections. Trace calcific atherosclerosis of the aortic arch, thoracic aorta is normal in course and caliber. No lymphadenopathy by CT size criteria.  Lungs: Patchy interstitial densities in the  lower lobes, patchy ground-glass opacities. Centrilobular and paraseptal emphysema accounting for apical lucencies. Tracheobronchial tree is patent, no pneumothorax.  Soft tissues and osseous structures: Included view of the abdomen are normal. Soft tissues are unremarkable. No destructive bony lesions or acute osseous process.  IMPRESSION: Patchy airspace opacities in a background of chronic interstitial lung disease could be seen with PCP, recommend pulmonary consultation.  No pneumothorax.   Electronically Signed   By: Awilda Metro M.D.   On: 03/20/2015 02:20   Dg Knee Complete 4 Views Right  03/19/2015   CLINICAL DATA:  Medial knee pain after falling on loose rocks.  EXAM: RIGHT KNEE - COMPLETE 4+ VIEW  COMPARISON:  None.  FINDINGS: There is no evidence of fracture, dislocation, or joint effusion. There is no evidence of arthropathy or other focal bone abnormality. Soft tissues are unremarkable.  IMPRESSION: Negative.   Electronically Signed   By: Ellery Plunk M.D.   On: 03/19/2015 21:32      Assessment/Plan:      39 y.o. male with  Active Problems:   1.     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     Human immunodeficiency virus (HIV) disease (HCC)   Check CD4 Count   Consult ID     3.     Leukocytosis   Monitor Trend     4.    Contusion of right knee   Pain Control PRN     5.    Fall  Mechanical in [place]      6.    DVT Prophylaxis   Lovenox     Code Status:     FULL CODE    Family Communication:   No Family Present   Disposition Plan:    Inpatient Status        Time spent:  63 Minutes      Ron Parker [facility] Pager [phone]   If 7AM -7PM Please Contact the Day Rounding Team MD for [facility]  If 7PM-7AM, Please Contact Night-Floor Coverage  [email or web address] Password [email or web address] 03/20/2015, 3:08 AM     ADDENDUM:    Patient was seen and examined on 03/20/2015

## 2015-03-21 DIAGNOSIS — B20 Human immunodeficiency virus [HIV] disease: Secondary | ICD-10-CM | POA: Insufficient documentation

## 2015-03-21 DIAGNOSIS — W19XXXD Unspecified fall, subsequent encounter: Secondary | ICD-10-CM

## 2015-03-21 DIAGNOSIS — S8001XD Contusion of right knee, subsequent encounter: Secondary | ICD-10-CM

## 2015-03-21 DIAGNOSIS — R630 Anorexia: Secondary | ICD-10-CM

## 2015-03-21 DIAGNOSIS — Z720 Tobacco use: Secondary | ICD-10-CM

## 2015-03-21 DIAGNOSIS — B59 Pneumocystosis: Secondary | ICD-10-CM | POA: Insufficient documentation

## 2015-03-21 LAB — BASIC METABOLIC PANEL
ANION GAP: 6 (ref 5–15)
BUN: 14 mg/dL (ref 6–20)
CHLORIDE: 107 mmol/L (ref 101–111)
CO2: 25 mmol/L (ref 22–32)
Calcium: 8.7 mg/dL — ABNORMAL LOW (ref 8.9–10.3)
Creatinine, Ser: 0.99 mg/dL (ref 0.61–1.24)
GFR calc non Af Amer: >60 mL/min (ref >60)
Glucose, Bld: 82 mg/dL (ref 65–99)
POTASSIUM: 3.9 mmol/L (ref 3.5–5.1)
SODIUM: 138 mmol/L (ref 135–145)

## 2015-03-21 LAB — URINE CULTURE: Culture: NO GROWTH

## 2015-03-21 LAB — CBC
HCT: 36.8 % — ABNORMAL LOW (ref 39.0–52.0)
HEMOGLOBIN: 12.2 g/dL — AB (ref 13.0–17.0)
MCH: 32.3 pg (ref 26.0–34.0)
MCHC: 33.2 g/dL (ref 30.0–36.0)
MCV: 97.4 fL (ref 78.0–100.0)
Platelets: 149 10*3/uL — ABNORMAL LOW (ref 150–400)
RBC: 3.78 MIL/uL — AB (ref 4.22–5.81)
RDW: 14 % (ref 11.5–15.5)
WBC: 5.9 10*3/uL (ref 4.0–10.5)

## 2015-03-21 MED ORDER — NICOTINE 14 MG/24HR TD PT24: 14.0000 mg | MEDICATED_PATCH | Freq: Every day | TRANSDERMAL | Status: DC

## 2015-03-21 MED ORDER — SULFAMETHOXAZOLE-TRIMETHOPRIM 800-160 MG PO TABS: 1.0000 | ORAL_TABLET | Freq: Every day | ORAL | Status: DC

## 2015-03-21 MED ORDER — AZITHROMYCIN 500 MG PO TABS: 500.0000 mg | ORAL_TABLET | Freq: Every day | ORAL | Status: AC

## 2015-03-21 MED ORDER — IBUPROFEN 800 MG PO TABS: 800.0000 mg | ORAL_TABLET | Freq: Three times a day (TID) | ORAL | Status: DC | PRN

## 2015-03-21 NOTE — Discharge Summary (Signed)
Physician Discharge Summary  Kurt Foley ZOX:[id number] DOB: 1975/07/16 DOA: 03/19/2015  PCP: Staci Righter, MD  Admit date: 03/19/2015 Discharge date: 03/21/2015  Time spent: 35 minutes  Recommendations for Outpatient Follow-up:  1. Repeat CXR in 3-4 weeks to follow resolution of infiltrates  Discharge Diagnoses:  Human immunodeficiency virus (HIV) disease (HCC) AIDS CAP (community acquired pneumonia) Bronchitis Leukocytosis Contusion of right knee Fall Tobacco abuse Poor appetite    Discharge Condition: stable and improved. Will discharge home and patient will follow up with PCP/ID specialist on 03/25/15  Diet recommendation: regular diet  Filed Weights   03/20/15 0247 03/20/15 0445  Weight: 54 kg (119 lb 0.8 oz) 51.9 kg (114 lb 6.7 oz)    History of present illness:  39 y.o. male with HIV Disease with last reported CD4 of 100 who presented to the ED with complaints of slipping and falling onto his right knee while walking outside. He was seen in the ED and had X-rays performed which were negative for acute findings. He was also found to have low grade fevers and complaints of coughing so a chest x-ray and Ct scan were perfomed and reveaeld ad LUL infiltrate.   Hospital Course:  1-right knee contusion  -after mechanical fall -pain improving and no fractures seen on x-ray images -will continue the use of PRN ibuprofen for pain control and to help with inflammation   2-bronchitis vs CAP: patient symptoms improved and essentially resolved  -no fever and WBC's WNL at discharge -following recommendations from ID service, will complete treatment with zithromax only  -presentation not impressive for PCP PNA -advise to quit smoking -continue PRN use tylenol/ibuprofen for multi-symptoms control/comfort  3-low grade fever/leukocytosis: due to #2 and potential demargination -at discharge WBC WNL after patient received IVF's and antibiotic therapy -patient afebrile at  discharge -advise to take medications as prescribed and to keep himself well hydrated  4-tobacco abuse: smoking cessation counseling provided -patient discharge with prescription for nicoderm  5-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 Luciana Axe)   Discharge Exam: Filed Vitals:   03/21/15 0520  BP: 130/94  Pulse: 83  Temp: 98.6 F (37 C)  Resp: 16    General: afebrile, reports improvement in his coughing spells. No CP and no SOB. WBC's WNL at discharge Cardiovascular: S1 and S2, no rubs or gallops, no JVD Respiratory: good air movement, no wheezing, positive scattered rhonchi and no rales  Abd: soft, NT, ND, positive BS Extremities: no edema or cyanosis   Discharge Instructions   Discharge Instructions    Discharge instructions    Complete by:  As directed   Take medications as prescribed Please stop smoking Follow with infectious disease specialist as instructed (Dr. Luciana Axe) Maintain adequate hydration          Current Discharge Medication List    START taking these medications   Details  azithromycin (ZITHROMAX) 500 MG tablet Take 1 tablet (500 mg total) by mouth daily. Qty: 4 tablet, Refills: 0    ibuprofen (ADVIL,MOTRIN) 800 MG tablet Take 1 tablet (800 mg total) by mouth every 8 (eight) hours as needed for moderate pain. Qty: 30 tablet, Refills: 0    nicotine (NICODERM CQ) 14 mg/24hr patch Place 1 patch (14 mg total) onto the skin daily. Qty: 28  patch, Refills: 0      CONTINUE these medications which have CHANGED   Details  sulfamethoxazole-trimethoprim (BACTRIM DS,SEPTRA DS) 800-160 MG tablet Take 1 tablet by mouth daily. Qty: 30 tablet, Refills: 2      CONTINUE these medications which have NOT CHANGED   Details   Abacavir-Dolutegravir-Lamivud 600-50-300 MG TABS Take 1 tablet by mouth daily. Qty: 30 tablet, Refills: 5    darunavir-cobicistat (PREZCOBIX) 800-150 MG per tablet Take 1 tablet by mouth daily. Swallow whole. Do NOT crush, break or chew tablets. Take with food. Qty: 30 tablet, Refills: 5    dronabinol (MARINOL) 10 MG capsule TAKE ONE CAPSULE BY MOUTH TWICE DAILY BEFORE MEALS Qty: 60 capsule, Refills: 1    Emollient (EUCERIN) lotion Apply topically as needed for dry skin. Qty: 240 mL, Refills: 1    hydrocortisone (ANUSOL-HC) 2.5 % rectal cream Place 1 application rectally 2 (two) times daily. Qty: 30 g, Refills: 1    triamcinolone ointment (KENALOG) 0.5 % Apply 1 application topically 2 (two) times daily. Qty: 30 g, Refills: 1       No Known Allergies Follow-up Information    Follow up with Staci Righter, MD On 03/25/2015.   Specialty:  Infectious Diseases   Why:  Hospital Follow up   Contact information:   [address] [place] [address] [phone]       The results of significant diagnostics from this hospitalization (including imaging, microbiology, ancillary and laboratory) are listed below for reference.    Significant Diagnostic Studies: Dg Chest 2 View  03/19/2015   CLINICAL DATA:  Status post fall.  EXAM: CHEST  2 VIEW  COMPARISON:  06/17/2014  FINDINGS: Cardiomediastinal silhouette is normal. Mediastinal contours appear intact.  There is no evidence of focal airspace consolidation, or pleural effusion. There are mild emphysematous changes. The left superior lung has hyper lucent appearance, although no clearly defined pleural line is seen.  Osseous structures are without acute abnormality. Healed left rib fracture is seen. There is mild dextro convex scoliosis of the thoracic spine. Soft tissues are grossly normal.  IMPRESSION: Hyperlucent appearance of the left superior lung, with non visualization of the pleural surface. This may represent a  pneumothorax or bullous changes. Further evaluation with right side down decubitus radiograph may be considered to differentiate between the two.   Electronically Signed   By: Ted Mcalpine M.D.   On: 03/19/2015 22:58   Ct Chest W Contrast  03/20/2015   CLINICAL DATA:  Immunocompromised patient, lucent LEFT lung apex after fall, here for further evaluation.  EXAM: CT CHEST WITH CONTRAST  TECHNIQUE: Multidetector CT imaging of the chest was performed during intravenous contrast administration.  CONTRAST:  75mL OMNIPAQUE IOHEXOL 300 MG/ML  SOLN  COMPARISON:  Chest radiograph March 19, 2015  FINDINGS: Mediastinum: The heart size is normal. No pericardial collections. Trace calcific atherosclerosis of the aortic arch, thoracic aorta is normal in course and caliber. No lymphadenopathy by CT size criteria.  Lungs: Patchy interstitial densities in the lower lobes, patchy ground-glass opacities. Centrilobular and paraseptal emphysema accounting for apical lucencies. Tracheobronchial tree is patent, no pneumothorax.  Soft tissues and osseous structures: Included view of the abdomen are normal. Soft tissues are unremarkable. No destructive bony lesions or acute osseous process.  IMPRESSION: Patchy airspace opacities in a background of chronic interstitial lung disease could be seen with PCP, recommend pulmonary consultation.  No pneumothorax.   Electronically Signed   By: Awilda Metro M.D.   On:  03/20/2015 02:20   Dg Knee Complete 4 Views Right  03/19/2015   CLINICAL DATA:  Medial knee pain after falling on loose rocks.  EXAM: RIGHT KNEE - COMPLETE 4+ VIEW  COMPARISON:  None.  FINDINGS: There is no evidence of fracture, dislocation, or joint effusion. There is no evidence of arthropathy or other focal bone abnormality. Soft tissues are unremarkable.  IMPRESSION: Negative.   Electronically Signed   By: Ellery Plunk M.D.   On: 03/19/2015 21:32   Labs: Basic Metabolic Panel:  Recent Labs Lab  03/19/15 2237 03/20/15 0716 03/21/15 0405  NA 135 136 138  K 4.1 3.6 3.9  CL 104 102 107  CO2 GLUCOSE 97 75 82  BUN CREATININE 1.06 0.96 0.99  CALCIUM 9.5 8.7* 8.7*   CBC:  Recent Labs Lab 03/19/15 2237 03/20/15 0716 03/21/15 0405  WBC 14.9* 9.5 5.9  NEUTROABS 13.0*  --   --   HGB 12.8* 12.6* 12.2*  HCT 37.6* 37.7* 36.8*  MCV 96.4 97.4 97.4  PLT 189 172 149*    Signed:  Vassie Loll  [facility] 03/21/2015, 10:19 AM

## 2015-03-21 NOTE — Progress Notes (Signed)
Case manager was notified about patient's need for resources to fill up his med. Prescriptions, case manager said she has coupons for patient but patient did not wait for CM.Pt went home with prescriptions.

## 2015-03-22 ENCOUNTER — Other Ambulatory Visit: Payer: Self-pay | Admitting: Internal Medicine

## 2015-03-22 LAB — T-HELPER CELLS (CD4) COUNT (NOT AT ARMC)
CD4 T CELL HELPER: 7 % — AB (ref 33–55)
CD4 T Cell Abs: 80 /uL — ABNORMAL LOW (ref 400–2700)

## 2015-03-22 MED ORDER — DRONABINOL 10 MG PO CAPS: 10.0000 mg | ORAL_CAPSULE | Freq: Two times a day (BID) | ORAL | Status: DC

## 2015-03-22 NOTE — Care Management Note (Addendum)
Case Management Note  Patient Details  Name: Kurt Foley MRN: [id number] Date of Birth: 04-16-1976  Subjective/Objective:      PNA              Action/Plan: NCM faxed goodrx coupons for abx, for $11, $10 to pt's pharmacy, [facility] on 03/21/2015. Provided pt's contact number to [facility] liaison to arrange follow up appt. Attempted several times to reach pt via cell number. Left message for pt to return call.   Expected Discharge Date:  03/22/2015               Expected Discharge Plan:  Home  In-House Referral:     Discharge planning Services  CM Consult, Medication Assistance   Status of Service:  Completed, signed off  Medicare Important Message Given:    Date Medicare IM Given:    Medicare IM give by:    Date Additional Medicare IM Given:    Additional Medicare Important Message give by:     If discussed at Long Length of Stay Meetings, dates discussed:    Additional Comments:  Elliot Cousin, RN 03/22/2015, 2:22 PM

## 2015-03-23 ENCOUNTER — Telehealth: Payer: Self-pay

## 2015-03-23 LAB — HIV-1 RNA ULTRAQUANT REFLEX TO GENTYP+
HIV-1 RNA BY PCR: <20 copies/mL
HIV-1 RNA QUANT, LOG: UNDETERMINED {Log_copies}/mL

## 2015-03-23 NOTE — Telephone Encounter (Signed)
Referral received from Isidoro Donning, RN CM requesting a hospital follow up appointment for the patient.  Call placed to # [phone] (H) and a voice mail message was left requesting a call back to # [phone] or [phone].

## 2015-03-25 ENCOUNTER — Encounter: Payer: Self-pay | Admitting: Internal Medicine

## 2015-03-25 ENCOUNTER — Ambulatory Visit (INDEPENDENT_AMBULATORY_CARE_PROVIDER_SITE_OTHER): Payer: Self-pay | Admitting: Internal Medicine

## 2015-03-25 ENCOUNTER — Other Ambulatory Visit: Payer: Self-pay | Admitting: Internal Medicine

## 2015-03-25 VITALS — BP 125/83 | HR 75 | Temp 97.9°F | Ht 66.0 in | Wt 120.0 lb

## 2015-03-25 DIAGNOSIS — B20 Human immunodeficiency virus [HIV] disease: Secondary | ICD-10-CM

## 2015-03-25 DIAGNOSIS — J189 Pneumonia, unspecified organism: Secondary | ICD-10-CM

## 2015-03-25 DIAGNOSIS — Z720 Tobacco use: Secondary | ICD-10-CM

## 2015-03-25 DIAGNOSIS — Z79899 Other long term (current) drug therapy: Secondary | ICD-10-CM | POA: Insufficient documentation

## 2015-03-25 DIAGNOSIS — Z113 Encounter for screening for infections with a predominantly sexual mode of transmission: Secondary | ICD-10-CM | POA: Insufficient documentation

## 2015-03-25 LAB — CULTURE, BLOOD (ROUTINE X 2)
CULTURE: NO GROWTH
Culture: NO GROWTH

## 2015-03-25 NOTE — Assessment & Plan Note (Signed)
Resolved.  Can stop antibiotics

## 2015-03-25 NOTE — Assessment & Plan Note (Signed)
Doing good on the regimen.  Can rtc 4 months.

## 2015-03-25 NOTE — Assessment & Plan Note (Signed)
I discussed continued smoking cessation.

## 2015-03-25 NOTE — Progress Notes (Signed)
   Subjective:    Patient ID: Kurt Foley, male    DOB: 24-Feb-1976, 39 y.o.   MRN: [id number]  HPI Here for follow up from hospitalization and HIV.  Has had sporadic follow up but continues to take Prezcobix and Triumeq.  Denies missed doses.  In hospital had CD4 of 80 (during acute illness, previously 110) and viral load continued to be undetectable.  Dx with pneumonia and completed azithromycin. CXR c/w atypical pneumonia vs viral.  Had cough for over 1 month.  Now feels much better with no cough, reducing tobacco to 6-7 per day, no fever, no chills.     Review of Systems  Constitutional: Negative for fever, chills and fatigue.  Respiratory: Negative for cough, shortness of breath and wheezing.   Gastrointestinal: Negative for nausea and diarrhea.  Skin: Negative for rash.  Neurological: Negative for dizziness, light-headedness and headaches.       Objective:   Physical Exam  Constitutional: He appears well-developed and well-nourished. No distress.  HENT:  Mouth/Throat: No oropharyngeal exudate.  Eyes: No scleral icterus.  Cardiovascular: Normal rate, regular rhythm and normal heart sounds.   No murmur heard. Pulmonary/Chest: Effort normal and breath sounds normal. No respiratory distress. He has no wheezes.  Lymphadenopathy:    He has no cervical adenopathy.  Skin: No rash noted.    Social History   Social History  . Marital Status: Married    Spouse Name: N/A  . Number of Children: N/A  . Years of Education: N/A   Occupational History  . Not on file.   Social History Main Topics  . Smoking status: Current Every Day Smoker -- 0.50 packs/day for 25 years    Types: Cigarettes  . Smokeless tobacco: Never Used     Comment: cutting back  . Alcohol Use: 1.2 oz/week    2 Standard drinks or equivalent per week     Comment: occasional  . Drug Use: Yes    Special: Marijuana     Comment: "not lately"  . Sexual Activity:    Partners: Male    Birth Control/  Protection: Condom     Comment: encouraged condom use   Other Topics Concern  . Not on file   Social History Narrative   Much less cigarettes lately      Assessment & Plan:

## 2015-03-29 ENCOUNTER — Telehealth: Payer: Self-pay

## 2015-03-29 NOTE — Telephone Encounter (Signed)
Attempted to contact the patient to schedule an appointment at [facility]. Call placed to # [phone] and a voice mail message was left requesting a call back to # [phone] or [phone]. 

## 2015-03-30 ENCOUNTER — Telehealth: Payer: Self-pay

## 2015-03-30 NOTE — Telephone Encounter (Signed)
Attempt[facility] to contact the patient [facility]. Call placed to # [phone] and a voice mail message was left requesting a call back to # [phone] or [phone].

## 2015-04-06 ENCOUNTER — Ambulatory Visit: Payer: Self-pay | Admitting: *Deleted

## 2015-04-06 ENCOUNTER — Emergency Department (HOSPITAL_COMMUNITY)
Admission: EM | Admit: 2015-04-06 | Discharge: 2015-04-07 | Disposition: A | Discharge status: Other Acute Inpatient Hospital | Payer: Self-pay | Attending: Emergency Medicine | Admitting: Emergency Medicine

## 2015-04-06 ENCOUNTER — Encounter (HOSPITAL_COMMUNITY): Payer: Self-pay | Admitting: Family Medicine

## 2015-04-06 DIAGNOSIS — F329 Major depressive disorder, single episode, unspecified: Secondary | ICD-10-CM | POA: Insufficient documentation

## 2015-04-06 DIAGNOSIS — F32A Depression, unspecified: Secondary | ICD-10-CM

## 2015-04-06 DIAGNOSIS — B20 Human immunodeficiency virus [HIV] disease: Secondary | ICD-10-CM | POA: Insufficient documentation

## 2015-04-06 DIAGNOSIS — Z79899 Other long term (current) drug therapy: Secondary | ICD-10-CM | POA: Insufficient documentation

## 2015-04-06 DIAGNOSIS — R45851 Suicidal ideations: Secondary | ICD-10-CM

## 2015-04-06 DIAGNOSIS — Z792 Long term (current) use of antibiotics: Secondary | ICD-10-CM | POA: Insufficient documentation

## 2015-04-06 DIAGNOSIS — Z720 Tobacco use: Secondary | ICD-10-CM | POA: Insufficient documentation

## 2015-04-06 DIAGNOSIS — F1410 Cocaine abuse, uncomplicated: Secondary | ICD-10-CM | POA: Insufficient documentation

## 2015-04-06 HISTORY — DX: Major depressive disorder, single episode, unspecified: F32.9

## 2015-04-06 HISTORY — DX: Depression, unspecified: F32.A

## 2015-04-06 LAB — COMPREHENSIVE METABOLIC PANEL
ALBUMIN: 4.1 g/dL (ref 3.5–5.0)
ALT: 15 U/L — ABNORMAL LOW (ref 17–63)
ANION GAP: 6 (ref 5–15)
AST: 31 U/L (ref 15–41)
Alkaline Phosphatase: 79 U/L (ref 38–126)
BILIRUBIN TOTAL: 0.3 mg/dL (ref 0.3–1.2)
BUN: 10 mg/dL (ref 6–20)
CO2: 26 mmol/L (ref 22–32)
Calcium: 9.2 mg/dL (ref 8.9–10.3)
Chloride: 106 mmol/L (ref 101–111)
Creatinine, Ser: 0.95 mg/dL (ref 0.61–1.24)
GFR calc Af Amer: >60 mL/min (ref >60)
GLUCOSE: 87 mg/dL (ref 65–99)
POTASSIUM: 4.4 mmol/L (ref 3.5–5.1)
SODIUM: 138 mmol/L (ref 135–145)
TOTAL PROTEIN: 7.6 g/dL (ref 6.5–8.1)

## 2015-04-06 LAB — CBC
HCT: 37.8 % — ABNORMAL LOW (ref 39.0–52.0)
Hemoglobin: 12.8 g/dL — ABNORMAL LOW (ref 13.0–17.0)
MCH: 32.7 pg (ref 26.0–34.0)
MCHC: 33.9 g/dL (ref 30.0–36.0)
MCV: 96.4 fL (ref 78.0–100.0)
PLATELETS: 182 10*3/uL (ref 150–400)
RBC: 3.92 MIL/uL — ABNORMAL LOW (ref 4.22–5.81)
RDW: 13.8 % (ref 11.5–15.5)
WBC: 6.9 10*3/uL (ref 4.0–10.5)

## 2015-04-06 LAB — RAPID URINE DRUG SCREEN, HOSP PERFORMED
Amphetamines: NOT DETECTED
BENZODIAZEPINES: NOT DETECTED
Barbiturates: NOT DETECTED
COCAINE: POSITIVE — AB
OPIATES: NOT DETECTED
Tetrahydrocannabinol: NOT DETECTED

## 2015-04-06 LAB — ACETAMINOPHEN LEVEL

## 2015-04-06 LAB — SALICYLATE LEVEL: Salicylate Lvl: <4 mg/dL (ref 2.8–30.0)

## 2015-04-06 LAB — ETHANOL

## 2015-04-06 MED ORDER — DARUNAVIR-COBICISTAT 800-150 MG PO TABS
1.0000 | ORAL_TABLET | Freq: Every day | ORAL | Status: DC
  Administered 2015-04-07: 1 via ORAL
  Filled 2015-04-06 (×2): qty 1

## 2015-04-06 MED ORDER — ABACAVIR-DOLUTEGRAVIR-LAMIVUD 600-50-300 MG PO TABS: 1.0000 | ORAL_TABLET | Freq: Every day | ORAL | Status: DC

## 2015-04-06 MED ORDER — TRAZODONE HCL 50 MG PO TABS
50.0000 mg | ORAL_TABLET | Freq: Every evening | ORAL | Status: DC | PRN
  Filled 2015-04-06: qty 1

## 2015-04-06 MED ORDER — LAMIVUDINE 150 MG PO TABS
300.0000 mg | ORAL_TABLET | Freq: Every day | ORAL | Status: DC
  Administered 2015-04-07: 300 mg via ORAL
  Filled 2015-04-06 (×2): qty 2

## 2015-04-06 MED ORDER — ABACAVIR SULFATE 300 MG PO TABS
600.0000 mg | ORAL_TABLET | Freq: Every day | ORAL | Status: DC
  Administered 2015-04-07: 600 mg via ORAL
  Filled 2015-04-06 (×2): qty 2

## 2015-04-06 MED ORDER — SULFAMETHOXAZOLE-TRIMETHOPRIM 800-160 MG PO TABS
1.0000 | ORAL_TABLET | Freq: Every day | ORAL | Status: DC
  Administered 2015-04-07: 1 via ORAL
  Filled 2015-04-06: qty 1

## 2015-04-06 MED ORDER — DOLUTEGRAVIR SODIUM 50 MG PO TABS
50.0000 mg | ORAL_TABLET | Freq: Every day | ORAL | Status: DC
  Administered 2015-04-07: 50 mg via ORAL
  Filled 2015-04-06 (×2): qty 1

## 2015-04-06 MED ORDER — DRONABINOL 5 MG PO CAPS
10.0000 mg | ORAL_CAPSULE | Freq: Two times a day (BID) | ORAL | Status: DC
  Administered 2015-04-07: 10 mg via ORAL
  Filled 2015-04-06: qty 2

## 2015-04-06 NOTE — ED Notes (Signed)
Pt states he is suicidal but not homicidal. Pt states there are times he does not want to live and wishes someone would hurt himself. Has depression. Pt was at [facility] and sent over for treatment and placement.

## 2015-04-06 NOTE — BH Assessment (Addendum)
Tele Assessment Note   Kurt Foley is a 39 y.o. male who [facility] with SI thoughts and depression.  Pt says there are times when he does not want to live and wishes someone would hurt himself.  [facility] and they advised to go to the emerg dept for medical clearance and placement.  Pt says he's been SI for approx 2-3 days and has no specific plan to harm himself, however yesterday he was contemplating walking in traffic--"I'm overwhelmed and my mind and body are tired of fighting, I would be better off if I'm gone".  Pt says he doesn't care and stopped taking his HIV medications, 3 days ago.  Pt says his thoughts are triggered by: (1) worsening problems due to HIV status; (2) no family support; (3) homelessness; (4) financial issues and (5) drug related problems.  Pt denies SI attempts.  Pt denies HI/AVH.  Pt is tearful during the interview.  Pt admits that he drinks 3-4 40's daily and his last drink was today, he drank 2-24oz beers.  He smokes at least $200 worth of crack, daily and his last use was today, he smoked $100 worth of crack.  Pt smokes a "dime bag" of marijuana, weekly and his last use was 04/04/15.  Pt has no current w/d sxs and denies issues with seizures/blackouts.  Pt says he is self medicating to help him cope with the his feelings of being overwhelmed.  This Clinical research associatewriter discussed disposition with Donell SievertSpencer Simon, PA who recommends inpt tx.    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 HIV infection (HCC)   . Depression     History reviewed. No pertinent past surgical history.  Family History:  Family History  Problem Relation Age of Onset  . Diabetes Mother   . Hypertension Mother   . Arthritis Mother     Social History:   reports that he has been smoking Cigarettes.  He has a 12.5 pack-year smoking history. He has never used smokeless tobacco. He reports that he drinks about 1.2 oz of alcohol per week. He reports that he uses illicit drugs (Marijuana and Cocaine).  Additional Social History:  Alcohol / Drug Use Pain Medications: See MAR  Prescriptions: See MAR  Over the Counter: See MAR  History of alcohol / drug use?: Yes Longest period of sobriety (when/how long): None  Negative Consequences of Use: Work / Programmer, multimediachool, Copywriter, advertisingersonal relationships, Surveyor, quantityinancial Withdrawal Symptoms: Other (Comment) (No w/d sxs ) Substance #1 Name of Substance 1: Alcohol  1 - Age of First Use: 14 YOM  1 - Amount (size/oz): 3-4 40's  1 - Frequency: Daily  1 - Duration: On-going  1 - Last Use / Amount: 04/06/15 Substance #2 Name of Substance 2: Crack/Cocaine 2 - Age of First Use: 20's  2 - Amount (size/oz): $200 2 - Frequency: Daily  2 - Duration: On-going  2 - Last Use / Amount: 04/06/15 Substance #3 Name of Substance 3: THC  3 - Age of First Use: 14 YOM  3 - Amount (size/oz): "Dime Bag"  3 - Frequency: Weekly  3 - Duration: On-going  3 - Last Use / Amount: 04/04/15  CIWA: CIWA-Ar BP: 104/73 mmHg Pulse Rate: 75 COWS:    PATIENT STRENGTHS: (choose at least two) Communication skills Motivation  for treatment/growth  Allergies: No Known Allergies  Home Medications:  (Not in a hospital admission)  OB/GYN Status:  No LMP for male patient.  General Assessment Data Location of Assessment: [facility] TTS Assessment: In system Is this a Tele or Face-to-Face Assessment?: Tele Assessment Is this an Initial Assessment or a Re-assessment for this encounter?: Initial Assessment Marital status: Single Maiden name: None  Is patient pregnant?: No Pregnancy Status: No Living Arrangements: Other (Comment) (Homeless ) Can pt return to current living arrangement?: Yes Admission Status: Voluntary Is patient capable of signing  voluntary admission?: Yes Referral Source: MD Insurance type: SP  Medical Screening Exam [facility] Walk-in ONLY) Medical Exam completed: No Reason for MSE not completed: Other: (None )  Crisis Care Plan Living Arrangements: Other (Comment) (Homeless ) Name of Psychiatrist: None  Name of Therapist: None   Education Status Is patient currently in school?: No Current Grade: None  Highest grade of school patient has completed: None  Name of school: None  Contact person: None   Risk to self with the past 6 months Suicidal Ideation: Yes-Currently Present Has patient been a risk to self within the past 6 months prior to admission? : Yes Suicidal Intent: Yes-Currently Present Has patient had any suicidal intent within the past 6 months prior to admission? : Yes Is patient at risk for suicide?: Yes Suicidal Plan?: No-Not Currently/Within Last 6 Months Has patient had any suicidal plan within the past 6 months prior to admission? : Yes Access to Means: Yes Specify Access to Suicidal Means: Pills, Sharps What has been your use of drugs/alcohol within the last 12 months?: Abusing: alcohol, crack, thc  Previous Attempts/Gestures: No How many times?: 0 Other Self Harm Risks: None  Triggers for Past Attempts: None known Intentional Self Injurious Behavior: None Family Suicide History: No Recent stressful life event(s): Other (Comment), Recent negative physical changes, Financial Problems (Homelessness ) Persecutory voices/beliefs?: Yes Depression: Yes Depression Symptoms: Insomnia, Tearfulness, Loss of interest in usual pleasures, Feeling worthless/self pity Substance abuse history and/or treatment for substance abuse?: Yes Suicide prevention information given to non-admitted patients: Not applicable  Risk to Others within the past 6 months Homicidal Ideation: No Does patient have any lifetime risk of violence toward others beyond the six months prior to admission? : No Thoughts of Harm to  Others: No Current Homicidal Intent: No Current Homicidal Plan: No Access to Homicidal Means: No Identified Victim: None  History of harm to others?: No Assessment of Violence: None Noted Violent Behavior Description: None  Does patient have access to weapons?: No Criminal Charges Pending?: No Does patient have a court date: No Is patient on probation?: No  Psychosis Hallucinations: None noted Delusions: None noted  Mental Status Report Appearance/Hygiene: Disheveled, In scrubs Eye Contact: Fair Motor Activity: Unremarkable Speech: Logical/coherent, Soft Level of Consciousness: Alert, Crying Mood: Depressed, Sad, Helpless Affect: Depressed, Sad, Flat Anxiety Level: Minimal Thought Processes: Coherent, Relevant Judgement: Impaired Orientation: Person, Place, Time, Situation Obsessive Compulsive Thoughts/Behaviors: None  Cognitive Functioning Concentration: Normal Memory: Recent Intact, Remote Intact IQ: Average Insight: Poor Impulse Control: Fair Appetite: Poor Weight Loss:  (Unk ) Weight Gain: 0 Sleep: Decreased Total Hours of Sleep: 4 Vegetative Symptoms: None  ADLScreening [facility] Assessment Services) Patient's cognitive ability adequate to safely complete daily activities?: Yes Patient able to express need for assistance with ADLs?: Yes Independently performs ADLs?: Yes (appropriate for developmental age)  Prior Inpatient Therapy Prior Inpatient Therapy: No Prior Therapy Dates: None  Prior Therapy Facilty/Provider(s): None  Reason for Treatment: None   Prior Outpatient Therapy Prior Outpatient Therapy: No Prior Therapy Dates: None  Prior Therapy Facilty/Provider(s): None  Reason for Treatment: None  Does patient have an ACCT team?: No Does patient have Intensive In-House Services?  : No Does patient have [facility] services? : No Does patient have P4CC services?: No  ADL Screening (condition at time of admission) Patient's cognitive ability adequate to  safely complete daily activities?: Yes Is the patient deaf or have difficulty hearing?: No Does the patient have difficulty seeing, even when wearing glasses/contacts?: No Does the patient have difficulty concentrating, remembering, or making decisions?: No Patient able to express need for assistance with ADLs?: Yes Does the patient have difficulty dressing or bathing?: No Independently performs ADLs?: Yes (appropriate for developmental age) Does the patient have difficulty walking or climbing stairs?: No Weakness of Legs: None Weakness of Arms/Hands: None  Home Assistive Devices/Equipment Home Assistive Devices/Equipment: None  Therapy Consults (therapy consults require a physician order) PT Evaluation Needed: No OT Evalulation Needed: No SLP Evaluation Needed: No Abuse/Neglect Assessment (Assessment to be complete while patient is alone) Physical Abuse: Denies Verbal Abuse: Denies Sexual Abuse: Denies Exploitation of patient/patient's resources: Denies Self-Neglect: Denies Values / Beliefs Cultural Requests During Hospitalization: None Spiritual Requests During Hospitalization: None Consults Spiritual Care Consult Needed: No Social Work Consult Needed: No Merchant navy officer (For Healthcare) Does patient have an advance directive?: No Would patient like information on creating an advanced directive?: No - patient declined information    Additional Information 1:1 In Past 12 Months?: No CIRT Risk: No Elopement Risk: No Does patient have medical clearance?: Yes     Disposition:  Disposition Initial Assessment Completed for this Encounter: Yes Disposition of Patient: Inpatient treatment program, Referred to (Per Donell Sievert, PA recommends inpt tx ) Type of inpatient treatment program: Adult (Per Donell Sievert, PA recommends inpt tx ) Patient referred to: Other (Comment) (Per Donell Sievert, PA recommends inpt tx )  Murrell Redden 04/06/2015 10:23 PM

## 2015-04-06 NOTE — ED Provider Notes (Signed)
CSN: [id number]     Arrival date & time 04/06/15  2026 History  By signing my name below, I, Octavia Heir, attest that this documentation has been prepared under the direction and in the presence of Earley Favor, Np. Electronically Signed: Octavia Heir, ED Scribe. 04/06/2015. 9:07 PM.    Chief Complaint  Patient presents with  . Suicidal    The history is provided by the patient. No language interpreter was used.   HPI Comments: Kurt Foley is a 39 y.o. male who presents to the Emergency Department complaining of suicidal thoughts.  Pt was at [facility] and left because he said he did not want want to be around anymore. Pt is HIV positive and reports he has not taken his HIV medication because "he does not care".   Past Medical History  Diagnosis Date  . HIV infection (HCC)    History reviewed. No pertinent past surgical history. Family History  Problem Relation Age of Onset  . Diabetes Mother   . Hypertension Mother   . Arthritis Mother    Social History  Substance Use Topics  . Smoking status: Current Every Day Smoker -- 0.50 packs/day for 25 years    Types: Cigarettes  . Smokeless tobacco: Never Used     Comment: cutting back  . Alcohol Use: 1.2 oz/week    2 Standard drinks or equivalent per week     Comment: 3-4 days a week.Last drink: today    Review of Systems  Psychiatric/Behavioral: Positive for suicidal ideas.  All other systems reviewed and are negative.     Allergies  Review of patient's allergies indicates no known allergies.  Home Medications   Prior to Admission medications   Medication Sig Start Date End Date Taking? Authorizing Provider  dronabinol (MARINOL) 10 MG capsule Take 1 capsule (10 mg total) by mouth 2 (two) times daily before a meal. 03/22/15   [name], MD  hydrocortisone (ANUSOL-HC) 2.5 % rectal cream Place 1 application rectally 2 (two) times daily. Patient not taking: Reported on 03/19/2015 02/03/15   Linna Hoff, MD   ibuprofen (ADVIL,MOTRIN) 800 MG tablet Take 1 tablet (800 mg total) by mouth every 8 (eight) hours as needed for moderate pain. 03/21/15   Vassie Loll, MD  PREZCOBIX 800-150 MG tablet TAKE 1 TABLET BY MOUTH DAILY. SWALLOW WHOLE. DO NOT CRUSH, BREAK OR CHEW TABLETS. TAKE WITH FOOD 03/25/15   [name], MD  sulfamethoxazole-trimethoprim (BACTRIM DS,SEPTRA DS) 800-160 MG tablet Take 1 tablet by mouth daily. 03/21/15   Vassie Loll, MD  triamcinolone ointment (KENALOG) 0.5 % Apply 1 application topically 2 (two) times daily. Patient not taking: Reported on 03/19/2015 09/22/14   [name], MD  TRIUMEQ 600-50-300 MG TABS TAKE 1 TABLET BY MOUTH DAILY 03/25/15   [name], MD   Triage vitals: BP 104/73 mmHg  Pulse 75  Temp(Src) 98.1 F (36.7 C) (Oral)  Resp 18  Ht  (1.676 m)  Wt 115 lb (52.164 kg)  BMI 18.57 kg/m2  SpO2 100% Physical Exam  Constitutional: He appears well-developed and well-nourished. No distress.  HENT:  Head: Normocephalic and atraumatic.  Eyes: Right eye exhibits no discharge. Left eye exhibits no discharge.  Pulmonary/Chest: Effort normal. No respiratory distress.  Neurological: He is alert. Coordination normal.  Skin: No rash noted. He is not diaphoretic.  Psychiatric: He has a normal mood and affect. His behavior is normal.  Nursing note and vitals reviewed.   ED Course  Procedures  DIAGNOSTIC STUDIES: Oxygen Saturation is 100% on RA, normal by my interpretation.  COORDINATION OF CARE:  9:06 PM Discussed treatment plan with pt at bedside and pt agreed to plan.  Labs Review Labs Reviewed  COMPREHENSIVE METABOLIC PANEL  ETHANOL  SALICYLATE LEVEL  ACETAMINOPHEN LEVEL  CBC  URINE RAPID DRUG SCREEN, HOSP PERFORMED    Imaging Review No results found. I have personally reviewed and evaluated these images and lab results as part of my medical decision-making.   EKG Interpretation None     Labs have been reviewed.  Patient is medically  cleared for psychiatric evaluation MDM   Final diagnoses:  None    I personally performed the services described in this documentation, which was scribed in my presence. The recorded information has been reviewed and is accurate.  Earley FavorGail Hercules Hasler, NP 04/06/15 [phone]  Gilda Creasehristopher J Pollina, MD 04/06/15 [phone]

## 2015-04-06 NOTE — Progress Notes (Signed)
[facility] - per Kurt MessierKathy, fax referral for review. [facility] - [place], fax referral, d/c on 10/26. [facility] - per Kurt BumpsJessica, fax referral for d/c on 10/26. [facility] - left voicemail  At capacity: [facility]   Kurt Foley, [place] Disposition staff 04/06/2015 10:51 PM

## 2015-04-06 NOTE — ED Notes (Signed)
This is  39 years old [place] American male involuntarily admitted to this unit. Patient reported that he was treated here 2 weeks ago. None compliant with medications and feeling more depressed. He reported feeling worthless and hopeless and feeling like life does not worth living. He also reported that he has no specific place to live so he sleeps wherever. He is homeless. He said [facility] and complaint of SI With plan to jump in front of traffic and was transferred here. He reported that he is HIV positive.  Writer encouraged and supported patient. Q 15 minute check initiated.

## 2015-04-07 ENCOUNTER — Encounter (HOSPITAL_COMMUNITY): Payer: Self-pay

## 2015-04-07 ENCOUNTER — Inpatient Hospital Stay (HOSPITAL_COMMUNITY)
Admission: EM | Admit: 2015-04-07 | Discharge: 2015-04-15 | DRG: 885 | Disposition: A | Discharge status: Home / Self Care | Payer: Federal, State, Local not specified - Other | Source: Intra-hospital | Attending: Psychiatry | Admitting: Psychiatry

## 2015-04-07 DIAGNOSIS — F1020 Alcohol dependence, uncomplicated: Secondary | ICD-10-CM | POA: Diagnosis not present

## 2015-04-07 DIAGNOSIS — Z833 Family history of diabetes mellitus: Secondary | ICD-10-CM | POA: Diagnosis not present

## 2015-04-07 DIAGNOSIS — F1424 Cocaine dependence with cocaine-induced mood disorder: Secondary | ICD-10-CM | POA: Diagnosis not present

## 2015-04-07 DIAGNOSIS — F1994 Other psychoactive substance use, unspecified with psychoactive substance-induced mood disorder: Secondary | ICD-10-CM | POA: Diagnosis present

## 2015-04-07 DIAGNOSIS — B20 Human immunodeficiency virus [HIV] disease: Secondary | ICD-10-CM | POA: Diagnosis present

## 2015-04-07 DIAGNOSIS — F1220 Cannabis dependence, uncomplicated: Secondary | ICD-10-CM | POA: Diagnosis present

## 2015-04-07 DIAGNOSIS — Z590 Homelessness: Secondary | ICD-10-CM | POA: Diagnosis not present

## 2015-04-07 DIAGNOSIS — F17210 Nicotine dependence, cigarettes, uncomplicated: Secondary | ICD-10-CM | POA: Diagnosis present

## 2015-04-07 DIAGNOSIS — G4700 Insomnia, unspecified: Secondary | ICD-10-CM | POA: Diagnosis present

## 2015-04-07 DIAGNOSIS — R45851 Suicidal ideations: Secondary | ICD-10-CM | POA: Diagnosis present

## 2015-04-07 DIAGNOSIS — Z8249 Family history of ischemic heart disease and other diseases of the circulatory system: Secondary | ICD-10-CM

## 2015-04-07 DIAGNOSIS — F1010 Alcohol abuse, uncomplicated: Secondary | ICD-10-CM | POA: Diagnosis present

## 2015-04-07 DIAGNOSIS — F410 Panic disorder [episodic paroxysmal anxiety] without agoraphobia: Secondary | ICD-10-CM | POA: Diagnosis present

## 2015-04-07 DIAGNOSIS — F332 Major depressive disorder, recurrent severe without psychotic features: Secondary | ICD-10-CM | POA: Diagnosis not present

## 2015-04-07 DIAGNOSIS — F1420 Cocaine dependence, uncomplicated: Secondary | ICD-10-CM | POA: Insufficient documentation

## 2015-04-07 MED ORDER — ENSURE ENLIVE PO LIQD
237.0000 mL | Freq: Two times a day (BID) | ORAL | Status: DC
  Administered 2015-04-08 – 2015-04-15 (×9): 237 mL via ORAL

## 2015-04-07 MED ORDER — TRAZODONE HCL 50 MG PO TABS
50.0000 mg | ORAL_TABLET | Freq: Every evening | ORAL | Status: DC | PRN
  Administered 2015-04-07 – 2015-04-08 (×2): 50 mg via ORAL
  Filled 2015-04-07 (×4): qty 1

## 2015-04-07 MED ORDER — DOLUTEGRAVIR SODIUM 50 MG PO TABS
50.0000 mg | ORAL_TABLET | Freq: Every day | ORAL | Status: DC
  Administered 2015-04-08 – 2015-04-09 (×2): 50 mg via ORAL
  Filled 2015-04-07 (×4): qty 1

## 2015-04-07 MED ORDER — DRONABINOL 5 MG PO CAPS
10.0000 mg | ORAL_CAPSULE | Freq: Two times a day (BID) | ORAL | Status: DC
  Administered 2015-04-08 – 2015-04-12 (×10): 10 mg via ORAL
  Filled 2015-04-07 (×10): qty 2

## 2015-04-07 MED ORDER — SULFAMETHOXAZOLE-TRIMETHOPRIM 800-160 MG PO TABS
1.0000 | ORAL_TABLET | Freq: Every day | ORAL | Status: DC
  Administered 2015-04-08 – 2015-04-15 (×8): 1 via ORAL
  Filled 2015-04-07 (×10): qty 1

## 2015-04-07 MED ORDER — MAGNESIUM HYDROXIDE 400 MG/5ML PO SUSP: 30.0000 mL | Freq: Every day | ORAL | Status: DC | PRN

## 2015-04-07 MED ORDER — ALUM & MAG HYDROXIDE-SIMETH 200-200-20 MG/5ML PO SUSP: 30.0000 mL | ORAL | Status: DC | PRN

## 2015-04-07 MED ORDER — ACETAMINOPHEN 325 MG PO TABS
650.0000 mg | ORAL_TABLET | Freq: Four times a day (QID) | ORAL | Status: DC | PRN
  Administered 2015-04-08 – 2015-04-15 (×14): 650 mg via ORAL
  Filled 2015-04-07 (×15): qty 2

## 2015-04-07 MED ORDER — ABACAVIR SULFATE 300 MG PO TABS
600.0000 mg | ORAL_TABLET | Freq: Every day | ORAL | Status: DC
  Administered 2015-04-08 – 2015-04-09 (×2): 600 mg via ORAL
  Filled 2015-04-07 (×4): qty 2

## 2015-04-07 MED ORDER — DARUNAVIR-COBICISTAT 800-150 MG PO TABS
1.0000 | ORAL_TABLET | Freq: Every day | ORAL | Status: DC
  Administered 2015-04-08: 1 via ORAL
  Filled 2015-04-07 (×2): qty 1

## 2015-04-07 MED ORDER — TRAZODONE HCL 50 MG PO TABS
50.0000 mg | ORAL_TABLET | Freq: Every evening | ORAL | Status: DC | PRN
  Administered 2015-04-07: 50 mg via ORAL

## 2015-04-07 MED ORDER — TRAZODONE HCL 50 MG PO TABS
50.0000 mg | ORAL_TABLET | Freq: Every evening | ORAL | Status: DC | PRN
  Filled 2015-04-07 (×2): qty 1

## 2015-04-07 MED ORDER — LAMIVUDINE 150 MG PO TABS
300.0000 mg | ORAL_TABLET | Freq: Every day | ORAL | Status: DC
  Administered 2015-04-08 – 2015-04-09 (×2): 300 mg via ORAL
  Filled 2015-04-07 (×4): qty 2

## 2015-04-07 NOTE — BH Specialist Note (Signed)
Client called counselor yesterday and indicated that he was not feeling well that he was having suicidal ideation and needed desperately to talk to someone.  Counselor encouraged client to come in to clinic but to call 911 if it was an immediate emergency. Client showed and was very upset.  Client stated that he was very over whelmed and felt like he could not go on.  Client reported cocaine use via smoking crack.  Client said that he just wants to go somewhere alone, smoke crack until he was gone (implying dying).  Counselor shared with client that it was great that he chose to reach out to someone that he could trust and feel safe for support right now. During this difficult time.  Client stated that he felt he needed to be admitted into a facility before he did something impulsive and stupid.  Counselor called [facility] and alerted them to his needs.  Client was transported to the [facility] and admitted.  Client communicated to counselor that he would be in touch as soon as he was discharged.  Counselor encouraged client to get some rest and engage in the treatment they try to provide to help him get stabilize.  Client agreed and said he would.  Counselor provided support and encouragement accordingly.   Jenel LucksJodi Jalisa Sacco, MA, LPCA [facility]

## 2015-04-07 NOTE — Progress Notes (Signed)
Pt confirms his pcp is Molly MaduroRobert Comer Pt [facility] and [place]

## 2015-04-07 NOTE — BH Assessment (Signed)
[facility] Assessment Progress Note  Per Thedore MinsMojeed Akintayo, MD, this pt requires psychiatric hospitalization at this time.  Berneice Heinrichina Tate, RN, [facility] Rm [facility].  Pt is under IVC and [facility].  Pt's nurse, Lincoln MaxinOlivette, has been notified, and agrees to send original paperwork along with pt via law enforcement, and to call report to [phone].  Doylene Canninghomas Kenyan Karnes, MA Triage Specialist [phone]

## 2015-04-07 NOTE — Progress Notes (Signed)
Patient ID: Kurt SolianReginald K Foley, male   DOB: [birth year], 39 y.o.   MRN: [id number]  [facility], SI no plan, homeless, "felt like I wanted to die and overwhelmed by sadness", pt denies SI presently, dealing with HIV x20 years, stressed out over health, pt states that he has lost 15 pounds recently without trying, no family/friend support, oriented to unit and rules, skin and contraband search done, old scar to right knee, patch of eczema between shoulder blades, no contraband found, meal given.

## 2015-04-08 ENCOUNTER — Encounter (HOSPITAL_COMMUNITY): Payer: Self-pay | Admitting: Psychiatry

## 2015-04-08 DIAGNOSIS — F1420 Cocaine dependence, uncomplicated: Secondary | ICD-10-CM | POA: Diagnosis present

## 2015-04-08 DIAGNOSIS — F1020 Alcohol dependence, uncomplicated: Secondary | ICD-10-CM

## 2015-04-08 DIAGNOSIS — F332 Major depressive disorder, recurrent severe without psychotic features: Secondary | ICD-10-CM | POA: Diagnosis present

## 2015-04-08 DIAGNOSIS — F1010 Alcohol abuse, uncomplicated: Secondary | ICD-10-CM | POA: Diagnosis present

## 2015-04-08 MED ORDER — DARUNAVIR-COBICISTAT 800-150 MG PO TABS
1.0000 | ORAL_TABLET | Freq: Every day | ORAL | Status: DC
  Administered 2015-04-09: 1 via ORAL
  Filled 2015-04-08 (×3): qty 1

## 2015-04-08 MED ORDER — NICOTINE 21 MG/24HR TD PT24
21.0000 mg | MEDICATED_PATCH | Freq: Every day | TRANSDERMAL | Status: DC
  Administered 2015-04-08 – 2015-04-09 (×2): 21 mg via TRANSDERMAL
  Filled 2015-04-08 (×5): qty 1

## 2015-04-08 MED ORDER — MIRTAZAPINE 15 MG PO TABS
15.0000 mg | ORAL_TABLET | Freq: Every day | ORAL | Status: DC
  Administered 2015-04-08 – 2015-04-10 (×3): 15 mg via ORAL
  Filled 2015-04-08 (×4): qty 1

## 2015-04-08 MED ORDER — HYDROCORTISONE 1 % EX CREA
TOPICAL_CREAM | Freq: Four times a day (QID) | CUTANEOUS | Status: DC
  Administered 2015-04-08 – 2015-04-13 (×11): via TOPICAL
  Filled 2015-04-08 (×2): qty 28

## 2015-04-08 MED ORDER — HYDROCORTISONE 1 % EX CREA: TOPICAL_CREAM | Freq: Four times a day (QID) | CUTANEOUS | Status: DC

## 2015-04-08 MED ORDER — DOCUSATE SODIUM 100 MG PO CAPS
100.0000 mg | ORAL_CAPSULE | Freq: Two times a day (BID) | ORAL | Status: DC
  Administered 2015-04-08 – 2015-04-15 (×14): 100 mg via ORAL
  Filled 2015-04-08 (×3): qty 1
  Filled 2015-04-08 (×3): qty 14
  Filled 2015-04-08 (×6): qty 1
  Filled 2015-04-08: qty 14
  Filled 2015-04-08 (×5): qty 1

## 2015-04-08 NOTE — [facility] Group Notes (Signed)
[facility] LCSW Group Therapy  04/08/2015 4:15 PM  Type of Therapy:  Group Therapy  Participation Level:  Did Not Attend-pt meeting with MD during group time. Excused   Summary of Progress/Problems:  Finding Balance in Life. Today's group focused on defining balance in one's own words, identifying things that can knock one off balance, and exploring healthy ways to maintain balance in life. Group members were asked to provide an example of a time when they felt off balance, describe how they handled that situation,and process healthier ways to regain balance in the future. Group members were asked to share the most important tool for maintaining balance that they [facility] and how they plan to apply this method after discharge.   Smart, Tyden Kann LCSWA  04/08/2015, 4:15 PM

## 2015-04-08 NOTE — Progress Notes (Signed)
NUTRITION ASSESSMENT  Pt identified as at risk on the Malnutrition Screen Tool  INTERVENTION: 1. Supplements: Continue Ensure Enlive po BID, each supplement provides 350 kcal and 20 grams of protein  NUTRITION DIAGNOSIS: Unintentional weight loss related to sub-optimal intake as evidenced by pt report.   Goal: Pt to meet >/= 90% of their estimated nutrition needs.  Monitor:  PO intake  Assessment:  Patient admitted with depression and substance abuse. Pt is homeless. Pt reports 15 lb of weight loss. Suspect poor quality diet PTA d/t drug abuse. Patient has been ordered Ensure supplements.  Height: Ht Readings from Last 1 Encounters:  04/07/15 5' 4.5" (1.638 m)    Weight: Wt Readings from Last 1 Encounters:  04/07/15 118 lb (53.524 kg)    Weight Hx: Wt Readings from Last 10 Encounters:  04/07/15 118 lb (53.524 kg)  04/06/15 115 lb (52.164 kg)  03/25/15 120 lb (54.432 kg)  03/20/15 114 lb 6.7 oz (51.9 kg)  09/22/14 119 lb (53.978 kg)  08/31/14 120 lb (54.432 kg)  02/12/14 117 lb (53.071 kg)  11/18/13 126 lb (57.153 kg)  10/30/13 125 lb (56.7 kg)    BMI:  Body mass index is 19.95 kg/(m^2). Pt meets criteria for normal range based on current BMI.  Estimated Nutritional Needs: Kcal: 25-30 kcal/kg Protein: > 1 gram protein/kg Fluid: 1 ml/kcal  Diet Order: Diet regular Room service appropriate?: Yes; Fluid consistency:: Thin Pt is also offered choice of unit snacks mid-morning and mid-afternoon.  Pt is eating as desired.   Lab results and medications reviewed.   Tilda FrancoLindsey Lakesa Coste, MS, RD, LDN Pager: [phone] After Hours Pager: [phone]

## 2015-04-08 NOTE — Progress Notes (Signed)
D- Patient alert and oriented. Flat, sad, and depressed. Currently denies SI, HI, and AVH.  C/o hemorrhoids.  Patient reports poor sleep, and rates his depression "7/10", feelings of hopelessness "5/10", and his anxiety "5/10" with 10 being the worst. A- Scheduled medications administered to patient, per MD orders. Support and encouragement provided.  Routine safety checks conducted every 15 minutes.  Patient informed to notify staff with problems or concerns. R- No adverse drug reactions noted. Patient contracts for safety at this time. Patient compliant with medications and treatment plan. Patient receptive, calm, and cooperative.  Patient remains safe at this time.

## 2015-04-08 NOTE — [facility] Group Notes (Signed)
The focus of this group is to educate the patient on the purpose and policies of crisis stabilization and provide a format to answer questions about their admission.  The group details unit policies and expectations of patients while admitted.  Patient did not attend 0900 nurse education orientation group this morning.  Patient stayed in bed.   

## 2015-04-08 NOTE — B[facility] Counselor (Signed)
CSW and MD made attempt to meet with pt this morning to complete assessment/PSA. Pt sleeping, unable to rouse. CSW will try again this afternoon.  Trula SladeHeather Smart, LCSWA Clinical Social Worker 04/08/2015 10:25 AM

## 2015-04-08 NOTE — Progress Notes (Signed)
D. Pt had been up and visible in milieu this evening, spoke about why he was here and spoke about some on-going depression and feeling overwhelmed recently. Pt spoke about on-going issues with sleep and as of this time pt has only been able to get approximately 2 hours of sleep this evening with medication. Pt spoke about how he needs to speak to the doctor about the issues he is having with sleep. A. Support provided, medication education given. R. Pt verbalized understanding, safety maintained.

## 2015-04-08 NOTE — [facility] Suicide Risk Assessment (Signed)
[facility]   Nursing information obtained from:  Patient Demographic factors:  Male, Unemployed, Low socioeconomic status Current Mental Status:  Suicidal ideation indicated by patient Loss Factors:  Decline in physical health, Financial problems / change in socioeconomic status, Loss of significant relationship Historical Factors:  Prior suicide attempts Risk Reduction Factors:  NA Total Time spent with patient: 45 minutes Principal Problem: Severe recurrent major depression without psychotic features (HCC) Diagnosis:   Patient Active Problem List   Diagnosis Date Noted  . Severe recurrent major depression without psychotic features (HCC) [F33.2] 04/08/2015  . Cocaine dependence (HCC) [F14.20] 04/08/2015  . Alcohol abuse [F10.10] 04/08/2015  . Substance induced mood disorder (HCC) [F19.94] 04/07/2015  . Screening examination for venereal disease [Z11.3] 03/25/2015  . Encounter for long-term (current) use of medications [Z79.899] 03/25/2015  . 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 AIDS (HCC) [B20]   . Tobacco abuse [Z72.0]   . Decreased appetite [R63.0]   . 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 CAP (community acquired pneumonia) [J18.9] 03/20/2015  . Leukocytosis [D72.829] 03/20/2015  . Contusion of right knee [S80.01XA] 03/20/2015  . Fall [W19.XXXA] 03/20/2015  . Loss of weight [R63.4] 02/12/2014  . Visual disturbance [H53.9] 11/18/2013  . Human immunodeficiency virus (HIV) disease (HCC) [B20] 10/30/2013  . Atopic dermatitis [L20.9] 10/30/2013     Continued Clinical Symptoms:  Alcohol Use Disorder Identification Test Final Score (AUDIT): 6 The "Alcohol Use Disorders Identification Test", Guidelines for Use in Primary Care, Second Edition.  World Science writerHealth Organization [facility](WHO). Score between 0-7:  no or low risk or alcohol related problems. Score between 8-15:  moderate risk of alcohol related  problems. Score between 16-19:  high risk of alcohol related problems. Score 20 or above:  warrants further diagnostic evaluation for alcohol dependence and treatment.   CLINICAL FACTORS:   Depression:   Comorbid alcohol abuse/dependence Alcohol/Substance Abuse/Dependencies Medical Diagnoses and Treatments/Surgeries   Psychiatric Specialty Exam: Physical Exam  ROS  Blood pressure 107/75, pulse 98, temperature 97.5 F (36.4 C), temperature source Oral, resp. rate 18, height 5' 4.5" (1.638 m), weight 53.524 kg (118 lb), SpO2 100 %.Body mass index is 19.95 kg/(m^2).    COGNITIVE FEATURES THAT CONTRIBUTE TO RISK:  Closed-mindedness, Polarized thinking and Thought constriction (tunnel vision)    SUICIDE RISK:   Moderate:  Frequent suicidal ideation with limited intensity, and duration, some specificity in terms of plans, no associated intent, good self-control, limited dysphoria/symptomatology, some risk factors present, and identifiable protective factors, including available and accessible social support.  PLAN OF CARE: see admission H and P  Medical Decision Making:  New problem, with additional work up planned, Review of Psycho-Social Stressors (1), Review of Medication Regimen & Side Effects (2) and Review of New Medication or Change in Dosage (2)  I certify that inpatient services furnished can reasonably be expected to improve the patient's condition.   Markes Shatswell A 04/08/2015, 4:07 PM

## 2015-04-08 NOTE — H&P (Signed)
Psychiatric Admission Assessment Adult  Patient Identification: Kurt Foley MRN:  [id number] Date of Evaluation:  04/08/2015 Chief Complaint:  MDD Recurrent Severe Principal Diagnosis: <principal problem not specified> Diagnosis:   Patient Active Problem List   Diagnosis Date Noted  . Substance induced mood disorder ([place]) [F19.94] 04/07/2015  . Screening examination for venereal disease [Z11.3] 03/25/2015  . Encounter for long-term (current) use of medications [Z79.899] 03/25/2015  . 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 AIDS ([place]) [B20]   . Tobacco abuse [Z72.0]   . Decreased appetite [R63.0]   . PCP (pneumocystis jiroveci pneumonia) ([place]) [B59] 03/20/2015  . CAP (community acquired pneumonia) [J18.9] 03/20/2015  . Leukocytosis [D72.829] 03/20/2015  . Contusion of right knee [S80.01XA] 03/20/2015  . Fall [W19.XXXA] 03/20/2015  . Loss of weight [R63.4] 02/12/2014  . Visual disturbance [H53.9] 11/18/2013  . Human immunodeficiency virus (HIV) disease ([place]) [B20] 10/30/2013  . Atopic dermatitis [L20.9] 10/30/2013   History of Present Illness:: 39 Y/o male who states that he is all alone, has no family support. States everything that has happened in his life keeps coming back. States he has been fighting this HIV for over 20 years. States he was in prison for 3 years for assault with a deadly weapon. His father died while he was in prison. States he has been here and there for the last 2 weeks. Was staying with his mother until then but they had a fall out. Using cocaine crack over 20 years on and off. Alcohol 2-3 beers a day 3-4 days a week. States that when he was diagnosed HIV this all start happening. States he did not expect it and his whole world came down. He also smokes pot 2-3 times a week. Has been increasingly more depressed. Upset about his low CD 4 count. States he uses in order to deal with the way he feels what makes things worst for  him   Kurt Foley is a 39 y.o. male who voluntarily presents to [facility] with SI thoughts and depression. Pt says there are times when he does not want to live and wishes someone would hurt himself. Pt initially presented to [facility] and they advised to go to the emerg dept for medical clearance and placement. Pt says he's been SI for approx 2-3 days and has no specific plan to harm himself, however yesterday he was contemplating walking in traffic--"I'm overwhelmed and my mind and body are tired of fighting, I would be better off if I'm gone". Pt says he doesn't care and stopped taking his HIV medications, 3 days ago. Pt says his thoughts are triggered by: (1) worsening problems due to HIV status; (2) no family support; (3) homelessness; (4) financial issues and (5) drug related problems. Pt denies SI attempts. Pt denies HI/AVH. Pt is tearful during the interview. Pt admits that he drinks 3-4 40's daily and his last drink was today, he drank 2-24oz beers. He smokes at least $200 worth of crack, daily and his last use was today, he smoked $100 worth of crack. Pt smokes a "dime bag" of marijuana, weekly and his last use was 04/04/15. Pt has no current w/d sxs and denies issues with seizures/blackouts. Pt says he is self medicating to help him cope with the his feelings of being overwhelmed Associated Signs/Symptoms: Depression Symptoms:  depressed mood, anhedonia, insomnia, fatigue, difficulty concentrating, anxiety, panic attacks, loss of energy/fatigue, disturbed sleep, weight loss, increased appetite, (Hypo) Manic Symptoms:  denies Anxiety Symptoms:  Excessive Worry, Panic Symptoms, Psychotic Symptoms:  denies PTSD Symptoms: Negative Total Time spent with patient: 45 minutes  Past Psychiatric History:   Risk to Self: Is patient at risk for suicide?: Yes Risk to Others:   Prior Inpatient Therapy:  Denies Prior Outpatient Therapy:  [facility]   Alcohol Screening: 1. How  often do you have a drink containing alcohol?: 2 to 3 times a week 2. How many drinks containing alcohol do you have on a typical day when you are drinking?: 3 or 4 3. How often do you have six or more drinks on one occasion?: Monthly Preliminary Score: 3 4. How often during the last year have you found that you were not able to stop drinking once you had started?: Never 5. How often during the last year have you failed to do what was normally expected from you becasue of drinking?: Never 6. How often during the last year have you needed a first drink in the morning to get yourself going after a heavy drinking session?: Never 7. How often during the last year have you had a feeling of guilt of remorse after drinking?: Never 8. How often during the last year have you been unable to remember what happened the night before because you had been drinking?: Never 9. Have you or someone else been injured as a result of your drinking?: No 10. Has a relative or friend or a doctor or another health worker been concerned about your drinking or suggested you cut down?: No Alcohol Use Disorder Identification Test Final Score (AUDIT): 6 Brief Intervention: AUDIT score less than 7 or less-screening does not suggest unhealthy drinking-brief intervention not indicated Substance Abuse History in the last 12 months:  Yes.   Consequences of Substance Abuse: Negative Previous Psychotropic Medications: Yes Trazodone ( not his prescription) Psychological Evaluations: No  Past Medical History:  Past Medical History  Diagnosis Date  . HIV infection ([place])   . Depression    History reviewed. No pertinent past surgical history. Family History:  Family History  Problem Relation Age of Onset  . Diabetes Mother   . Hypertension Mother   . Arthritis Mother    Family Psychiatric  History: Aunts uncles substance abuse also depression and anxiety Social History:  History  Alcohol Use  . 1.2 oz/week  . 2 Standard  drinks or equivalent per week    Comment: 3-4 days a week.Last drink: today     History  Drug Use  . Yes  . Special: Marijuana, Cocaine    Comment: Once a week. Last used yesterday.  Cocaine last used today.     Social History   Social History  . Marital Status: Married    Spouse Name: N/A  . Number of Children: N/A  . Years of Education: N/A   Social History Main Topics  . Smoking status: Current Every Day Smoker -- 0.50 packs/day for 25 years    Types: Cigarettes  . Smokeless tobacco: Never Used     Comment: cutting back  . Alcohol Use: 1.2 oz/week    2 Standard drinks or equivalent per week     Comment: 3-4 days a week.Last drink: today  . Drug Use: Yes    Special: Marijuana, Cocaine     Comment: Once a week. Last used yesterday.  Cocaine last used today.   Marland Kitchen Sexual Activity: Not Asked     Comment: encouraged condom use   Other Topics Concern  . 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 Single. 2-3 weeks ago broke up with partner  Additional Social History:                         Allergies:  No Known Allergies Lab Results:  Results for orders placed or performed during the hospital encounter of 04/06/15 (from the past 48 hour(s))  Urine rapid drug screen (hosp performed) (Not at [facility])     Status: Abnormal   Collection Time: 04/06/15  9:13 PM  Result Value Ref Range   Opiates NONE DETECTED NONE DETECTED   Cocaine POSITIVE (A) NONE DETECTED   Benzodiazepines NONE DETECTED NONE DETECTED   Amphetamines NONE DETECTED NONE DETECTED   Tetrahydrocannabinol NONE DETECTED NONE DETECTED   Barbiturates NONE DETECTED NONE DETECTED    Comment:        DRUG SCREEN FOR MEDICAL PURPOSES ONLY.  IF CONFIRMATION IS NEEDED FOR ANY PURPOSE, NOTIFY LAB WITHIN 5 DAYS.        LOWEST DETECTABLE LIMITS FOR URINE DRUG SCREEN Drug Class       Cutoff (ng/mL) Amphetamine       1000 Barbiturate      200 Benzodiazepine   706 Tricyclics       237 Opiates          300 Cocaine          300 THC              50   Comprehensive metabolic panel     Status: Abnormal   Collection Time: 04/06/15  9:20 PM  Result Value Ref Range   Sodium 138 135 - 145 mmol/L   Potassium 4.4 3.5 - 5.1 mmol/L   Chloride 106 101 - 111 mmol/L   CO2 26 22 - 32 mmol/L   Glucose, Bld 87 65 - 99 mg/dL   BUN 10 6 - 20 mg/dL   Creatinine, Ser 0.95 0.61 - 1.24 mg/dL   Calcium 9.2 8.9 - 10.3 mg/dL   Total Protein 7.6 6.5 - 8.1 g/dL   Albumin 4.1 3.5 - 5.0 g/dL   AST 31 15 - 41 U/L   ALT 15 (L) 17 - 63 U/L   Alkaline Phosphatase 79 38 - 126 U/L   Total Bilirubin 0.3 0.3 - 1.2 mg/dL   GFR calc non Af Amer >60 >60 mL/min   GFR calc Af Amer >60 >60 mL/min    Comment: (NOTE) The eGFR has been calculated using the CKD EPI equation. This calculation has not been validated in all clinical situations. eGFR's persistently <60 mL/min signify possible Chronic Kidney Disease.    Anion gap 6 5 - 15  Ethanol (ETOH)     Status: None   Collection Time: 04/06/15  9:20 PM  Result Value Ref Range   Alcohol, Ethyl (B) <5 <5 mg/dL    Comment:        LOWEST DETECTABLE LIMIT FOR SERUM ALCOHOL IS 5 mg/dL FOR MEDICAL PURPOSES ONLY   Salicylate level     Status: None   Collection Time: 04/06/15  9:20 PM  Result Value Ref Range   Salicylate Lvl <6.2 2.8 - 30.0 mg/dL  Acetaminophen level     Status: Abnormal   Collection Time: 04/06/15  9:20 PM  Result Value Ref Range   Acetaminophen (Tylenol), Serum <10 (L) 10 - 30 ug/mL    Comment:  THERAPEUTIC CONCENTRATIONS VARY SIGNIFICANTLY. A RANGE OF 10-30 ug/mL MAY BE AN EFFECTIVE CONCENTRATION FOR MANY PATIENTS. HOWEVER, SOME ARE BEST TREATED AT CONCENTRATIONS OUTSIDE THIS RANGE. ACETAMINOPHEN CONCENTRATIONS >150 ug/mL AT 4 HOURS AFTER INGESTION AND >50 ug/mL AT 12 HOURS AFTER INGESTION ARE OFTEN ASSOCIATED WITH TOXIC REACTIONS.   CBC      Status: Abnormal   Collection Time: 04/06/15  9:20 PM  Result Value Ref Range   WBC 6.9 4.0 - 10.5 K/uL   RBC 3.92 (L) 4.22 - 5.81 MIL/uL   Hemoglobin 12.8 (L) 13.0 - 17.0 g/dL   HCT 37.8 (L) 39.0 - 52.0 %   MCV 96.4 78.0 - 100.0 fL   MCH 32.7 26.0 - 34.0 pg   MCHC 33.9 30.0 - 36.0 g/dL   RDW 13.8 11.5 - 15.5 %   Platelets 182 150 - 371 K/uL    Metabolic Disorder Labs:  No results found for: HGBA1C, MPG No results found for: PROLACTIN Lab Results  Component Value Date   CHOL 141 10/30/2013   TRIG 196* 10/30/2013   HDL 28* 10/30/2013   CHOLHDL 5.0 10/30/2013   VLDL 39 10/30/2013   LDLCALC 74 10/30/2013    Current Medications: Current Facility-Administered Medications  Medication Dose Route Frequency Provider Last Rate Last Dose  . abacavir (ZIAGEN) tablet 600 mg  600 mg Oral Daily Kerrie Buffalo, NP   600 mg at 04/08/15 6967   And  . dolutegravir (TIVICAY) tablet 50 mg  50 mg Oral Daily Kerrie Buffalo, NP   50 mg at 04/08/15 0902   And  . lamiVUDine (EPIVIR) tablet 300 mg  300 mg Oral Daily Kerrie Buffalo, NP   300 mg at 04/08/15 0850  . acetaminophen (TYLENOL) tablet 650 mg  650 mg Oral Q6H PRN Kerrie Buffalo, NP   650 mg at 04/08/15 0853  . alum & mag hydroxide-simeth (MAALOX/MYLANTA) 200-200-20 MG/5ML suspension 30 mL  30 mL Oral Q4H PRN Kerrie Buffalo, NP      . Derrill Memo ON 04/09/2015] darunavir-cobicistat (PREZCOBIX) 800-150 MG per tablet 1 tablet  1 tablet Oral Q breakfast Nicholaus Bloom, MD      . dronabinol (MARINOL) capsule 10 mg  10 mg Oral BID Kerrie Buffalo, NP   10 mg at 04/08/15 0903  . feeding supplement (ENSURE ENLIVE) (ENSURE ENLIVE) liquid 237 mL  237 mL Oral BID BM Nicholaus Bloom, MD   237 mL at 04/08/15 1042  . magnesium hydroxide (MILK OF MAGNESIA) suspension 30 mL  30 mL Oral Daily PRN Kerrie Buffalo, NP      . nicotine (NICODERM CQ - dosed in mg/24 hours) patch 21 mg  21 mg Transdermal Daily Nicholaus Bloom, MD      . sulfamethoxazole-trimethoprim (BACTRIM  DS,SEPTRA DS) 800-160 MG per tablet 1 tablet  1 tablet Oral Daily Kerrie Buffalo, NP   1 tablet at 04/08/15 0850  . traZODone (DESYREL) tablet 50 mg  50 mg Oral QHS PRN Kerrie Buffalo, NP      . traZODone (DESYREL) tablet 50 mg  50 mg Oral QHS,MR X 1 Kerrie Buffalo, NP   50 mg at 04/08/15 0151   PTA Medications: Prescriptions prior to admission  Medication Sig Dispense Refill Last Dose  . dronabinol (MARINOL) 10 MG capsule Take 1 capsule (10 mg total) by mouth 2 (two) times daily before a meal. 60 capsule 1 unknown  . ibuprofen (ADVIL,MOTRIN) 800 MG tablet Take 1 tablet (800 mg total) by mouth every 8 (eight) hours as needed  for moderate pain. 30 tablet 0 04/05/2015 at Unknown time  . PREZCOBIX 800-150 MG tablet TAKE 1 TABLET BY MOUTH DAILY. SWALLOW WHOLE. DO NOT CRUSH, BREAK OR CHEW TABLETS. TAKE WITH FOOD 30 tablet 5 Past Week at Unknown time  . sulfamethoxazole-trimethoprim (BACTRIM DS,SEPTRA DS) 800-160 MG tablet Take 1 tablet by mouth daily. 30 tablet 2 Past Week at Unknown time  . triamcinolone ointment (KENALOG) 0.5 % Apply 1 application topically 2 (two) times daily. 30 g 1 unknown  . TRIUMEQ 600-50-300 MG TABS TAKE 1 TABLET BY MOUTH DAILY 30 tablet 5 Past Week at Unknown time  . hydrocortisone (ANUSOL-HC) 2.5 % rectal cream Place 1 application rectally 2 (two) times daily. (Patient not taking: Reported on 03/19/2015) 30 g 1 Not Taking    Musculoskeletal: Strength & Muscle Tone: within normal limits Gait & Station: normal Patient leans: normal  Psychiatric Specialty Exam: Physical Exam  Review of Systems  Constitutional: Positive for weight loss and malaise/fatigue.  Eyes: Positive for blurred vision.  Respiratory: Positive for cough.        Pack every two days lately pack a week  Cardiovascular: Positive for chest pain.  Gastrointestinal: Positive for constipation.  Genitourinary: Negative.   Musculoskeletal: Negative.   Skin: Positive for rash.  Neurological: Positive for  dizziness, weakness and headaches.  Endo/Heme/Allergies: Negative.   Psychiatric/Behavioral: Positive for depression and substance abuse. The patient is nervous/anxious and has insomnia.     Blood pressure 107/75, pulse 98, temperature 97.5 F (36.4 C), temperature source Oral, resp. rate 18, height 5' 4.5" (1.638 m), weight 53.524 kg (118 lb), SpO2 100 %.Body mass index is 19.95 kg/(m^2).  General Appearance: normal  Eye Contact::  Fair  Speech:  Clear and Coherent  Volume:  Decreased  Mood:  Anxious, Depressed and worried  Affect:  anxious depressed worried  Thought Process:  Coherent and Goal Directed  Orientation:  Full (Time, Place, and Person)  Thought Content:  symptoms events worries concerns  Suicidal Thoughts:  Not now  Homicidal Thoughts:  No  Memory:  Immediate;   Fair Recent;   Fair Remote;   Fair  Judgement:  Fair  Insight:  Present  Psychomotor Activity:  Restlessness  Concentration:  Fair  Recall:  [employer] of Knowledge:Fair  Language: Fair  Akathisia:  No  Handed:  Right  AIMS (if indicated):     Assets:  Desire for Improvement  ADL's:  Intact  Cognition: WNL  Sleep:  Number of Hours: 3     Treatment Plan Summary: Daily contact with patient to assess and evaluate symptoms and progress in treatment and Medication management  Observation Level/Precautions:  15 minute checks  Laboratory:  As per the ED  Psychotherapy:  Individual/group  Medications:  Will monitor for the need of a detox protocol/reassess for the need for an antidepressant  Consultations:    Discharge Concerns:    Estimated LOS: 3-5 days  Other:    Supportive approach/coping skills Cocaine dependence; monitor mood fluctuations from coming off the cocaine Alcohol dependence-abuse: monitor for the need to start a detox protocol Work a relapse prevention plan Depression; will start Remeron to help sleep but at the same time for the depression and anxiety and his decreased appetite Will  start addressing his grief and loss Will use CBT/mindfulness I certify that inpatient services furnished can reasonably be expected to improve the patient's condition.   Gisele Pack A 10/27/201611:05 AM

## 2015-04-08 NOTE — Tx Team (Signed)
Interdisciplinary Treatment Plan Update (Adult)  Date:  04/08/2015  Time Reviewed:  8:32 AM   Progress in Treatment: Attending groups: No. Participating in groups:  No. Taking medication as prescribed:  Yes. Tolerating medication:  Yes. Family/Significant othe contact made:  SPE required for this pt.  Patient understands diagnosis:  Yes. and As evidenced by:  seeking treatment for SI, depression, ETOH abuse, and medication stabilization Discussing patient identified problems/goals with staff:  Yes. Medical problems stabilized or resolved:  Yes. Denies suicidal/homicidal ideation: No.  Passive SI/able to contract for safety on the unit. Issues/concerns per patient self-inventory:  Other:  Discharge Plan or Barriers: Pt is homeless. No current providers. CSW assessing for appropriate referrals. Currently, pt is sleeping, not willing to get up to complete PSA with CSW or meet with MD at this time.   Reason for Continuation of Hospitalization: Depression Medication stabilization Suicidal ideation Withdrawal symptoms  Comments:  Kurt Foley is a 39 y.o. male who voluntarily presents to [facility] with SI thoughts and depression. Pt says there are times when he does not want to live and wishes someone would hurt himself. Pt initially presented to [facility] and they advised to go to the emerg dept for medical clearance and placement. Pt says he's been SI for approx 2-3 days and has no specific plan to harm himself, however yesterday he was contemplating walking in traffic--"I'm overwhelmed and my mind and body are tired of fighting, I would be better off if I'm gone". Pt says he doesn't care and stopped taking his HIV medications, 3 days ago. Pt says his thoughts are triggered by: (1) worsening problems due to HIV status; (2) no family support; (3) homelessness; (4) financial issues and (5) drug related problems. Pt denies SI attempts. Pt denies HI/AVH. Pt is tearful during the interview.  Pt admits that he drinks 3-4 40's daily and his last drink was today, he drank 2-24oz beers. He smokes at least $200 worth of crack, daily and his last use was today, he smoked $100 worth of crack. Pt smokes a "dime bag" of marijuana, weekly and his last use was 04/04/15. Pt has no current w/d sxs and denies issues with seizures/blackouts. Pt says he is self medicating to help him cope with the his feelings of being overwhelmed. 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   Additional Comments:  Patient and CSW reviewed pt's identified goals and treatment plan. Patient verbalized understanding and agreed to treatment plan. CSW reviewed [facility] "Discharge Process and Patient Involvement" Form. Pt verbalized understanding of information provided and signed form.    Review of initial/current patient goals per problem list:  1. Goal(s): Patient will participate in aftercare plan  Met: No.   Target date: at discharge  As evidenced by: Patient will participate within aftercare plan AEB aftercare provider and housing plan at discharge being identified.  10/27: CSW assessing for appropriate referrals.   2. Goal (s): Patient will exhibit decreased depressive symptoms and suicidal ideations.  Met: No.    Target date: at discharge  As evidenced by: Patient will utilize self rating of depression at 3 or below and demonstrate decreased signs of depression or be deemed stable for discharge by MD.  10/27: Pt rates depression as high and endorses passive SI/able to contract for safety on  the unit.   4. Goal(s): Patient will demonstrate decreased signs of withdrawal due to substance abuse  Met:No.   Target date:at discharge   As evidenced by: Patient will  produce a CIWA/COWS score of 0, have stable vitals signs, and no symptoms of withdrawal.  10/27: Pt reports mild withdrawal symptoms with CIWA score of 1 and high sitting BP.    Attendees: Patient:   04/08/2015 8:32 AM   Family:   04/08/2015 8:32 AM   Physician:  Dr. Carlton Adam, MD 04/08/2015 8:32 AM   Nursing:   Ilda Basset RN 04/08/2015 8:32 AM   Clinical Social Worker: Maxie Better, [place]  04/08/2015 8:32 AM   Clinical Social Worker: Erasmo Downer Drinkard LCSWA; Peri Maris LCSWA 04/08/2015 8:32 AM   Other:  Gerline Legacy Nurse Case Manager 04/08/2015 8:32 AM   Other:  Lucinda Dell; [facility]  04/08/2015 8:32 AM   Other:   04/08/2015 8:32 AM   Other:  04/08/2015 8:32 AM   Other:  04/08/2015 8:32 AM   Other:  04/08/2015 8:32 AM    04/08/2015 8:32 AM    04/08/2015 8:32 AM    04/08/2015 8:32 AM    04/08/2015 8:32 AM    Scribe for Treatment Team:   Maxie Better, [place]  04/08/2015 8:32 AM

## 2015-04-09 DIAGNOSIS — F1010 Alcohol abuse, uncomplicated: Secondary | ICD-10-CM

## 2015-04-09 DIAGNOSIS — F1220 Cannabis dependence, uncomplicated: Secondary | ICD-10-CM | POA: Clinically undetermined

## 2015-04-09 MED ORDER — ABACAVIR-DOLUTEGRAVIR-LAMIVUD 600-50-300 MG PO TABS
1.0000 | ORAL_TABLET | Freq: Every day | ORAL | Status: DC
  Administered 2015-04-10 – 2015-04-12 (×3): 1 via ORAL
  Administered 2015-04-13: 600 mg via ORAL
  Administered 2015-04-14 – 2015-04-15 (×2): 1 via ORAL

## 2015-04-09 MED ORDER — NICOTINE POLACRILEX 2 MG MT GUM
CHEWING_GUM | OROMUCOSAL | Status: AC
  Administered 2015-04-09: 22:00:00
  Filled 2015-04-09: qty 1

## 2015-04-09 MED ORDER — NICOTINE POLACRILEX 2 MG MT GUM
2.0000 mg | CHEWING_GUM | OROMUCOSAL | Status: DC | PRN
  Administered 2015-04-10: 2 mg via ORAL
  Filled 2015-04-09 (×2): qty 1

## 2015-04-09 MED ORDER — HYDROXYZINE HCL 50 MG PO TABS
50.0000 mg | ORAL_TABLET | Freq: Every day | ORAL | Status: DC
  Administered 2015-04-09 – 2015-04-10 (×2): 50 mg via ORAL
  Filled 2015-04-09 (×5): qty 1

## 2015-04-09 MED ORDER — DARUNAVIR-COBICISTAT 800-150 MG PO TABS
1.0000 | ORAL_TABLET | Freq: Every day | ORAL | Status: DC
  Administered 2015-04-10 – 2015-04-15 (×6): 1 via ORAL

## 2015-04-09 NOTE — Clinical Social Work Note (Addendum)
CSW left Message for Dwain SarnaMitch McGee [facility]) at [phone] to inform him of pt's hospitalization and discharge plan. CSW requested call back to discuss pt aftercare.   Trula SladeHeather Smart, LCSWA Clinical Social Worker 04/09/2015 3:11 PM    [facility] fax[facility] per pt request.  Trula SladeHeather Smart, LCSWA Clinical Social Worker 04/09/2015 3:13 PM

## 2015-04-09 NOTE — Tx Team (Signed)
Initial Interdisciplinary Treatment Plan   PATIENT STRESSORS: Health problems Substance abuse   PATIENT STRENGTHS: Ability for insight Active sense of humor Average or above average intelligence Capable of independent living General fund of knowledge Motivation for treatment/growth   PROBLEM LIST: Problem List/Patient Goals Date to be addressed Date deferred Reason deferred Estimated date of resolution  Depression 04/07/15     Suicidal thoughts 04/07/15     "Need help with my depression" 04/07/15                                          DISCHARGE CRITERIA:  Ability to meet basic life and health needs Improved stabilization in mood, thinking, and/or behavior Verbal commitment to aftercare and medication compliance  PRELIMINARY DISCHARGE PLAN: Attend aftercare/continuing care group  PATIENT/FAMIILY INVOLVEMENT: This treatment plan has been presented to and reviewed with the patient, Aquilla SolianReginald K Lucchetti, and/or family member, .  The patient and family have been given the opportunity to ask questions and make suggestions.  Tangia Pinard, [place] 04/09/2015, 4:47 AM

## 2015-04-09 NOTE — Progress Notes (Signed)
Patient did not attend the evening speaker AA meeting. Pt was notified that group was beginning but returned to bed.   

## 2015-04-09 NOTE — Progress Notes (Signed)
D- Patient is pleasant and has a brighter affect. Patient observed in the milieu interacting well with peers.  Currently denies SI, HI, AVH, and pain.  Patient rates his depression and anxiety "5/10" with 10 being the worst.  Patient denies any feelings of hopelessness. Patient has c/o headache and reported relief following pain medication. A- PRN and scheduled medications administered to patient, per MD orders. Support and encouragement provided.  Routine safety checks conducted every 15 minutes.  Patient informed to notify staff with problems or concerns. R- No adverse drug reactions noted. Patient contracts for safety at this time. Patient compliant with medications and treatment plan. Patient receptive, calm, and cooperative. Patient remains safe at this time.

## 2015-04-09 NOTE — Progress Notes (Signed)
D.  Pt pleasant on approach, did not feel well enough to attend evening AA group.  Interacting appropriately with peers on the unit.  No complaints voiced.  Denies SI/HI/hallucinatinons at this time.  A.  Support and encouragement offered  R. Pt remains safe on the unit, will continue to monitor.

## 2015-04-09 NOTE — [facility] Suicide Risk Assessment (Signed)
[facility] INPATIENT:  Family/Significant Other Suicide Prevention Education  Suicide Prevention Education:  Patient Refusal for Family/Significant Other Suicide Prevention Education: The patient Kurt Foley has refused to provide written consent for family/significant other to be provided Family/Significant Other Suicide Prevention Education during admission and/or prior to discharge.  Physician notified.  Patient states he has no one to call, no family/friends.    Sallee LangeCunningham, Kinslee Dalpe C 04/09/2015, 3:21 PM

## 2015-04-09 NOTE — [facility] Group Notes (Signed)
[facility]   04/09/2015 9:36 AM  Participation Quality:  DID NOT ATTEND. Pt chose to remain in bed when invited. :  Smart, Lebron QuamHeather LCSWA

## 2015-04-09 NOTE — Progress Notes (Signed)
D. Pt had been up and visible in milieu this evening, did attend and participate in evening group activity. Pt spoke of his day and still reports feelings of depression as well as anxiety and spoke about how he did not sleep well last night but was hopeful for a good night sleep with medication change. Pt was seen interacting appropriately with peers throughout the evening. A. Support and encouragement provided. R. Safety maintained, will continue to monitor.
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
## 2015-04-09 NOTE — Progress Notes (Signed)
[facility] Progress [facility]  04/09/2015 1:52 PM Aquilla SolianReginald K Morawski  MRN:  [id number] Subjective: Patient states " I am tired. I did not sleep that well last night.'  Objective:Mr.Krull is a [age] y old AAM , with hx of depression , polysubstance abuse including cocaine, alcohol , who presented with worsening depression. Patient seen and chart reviewed.Discussed patient with treatment team. Pt today seen in bed as very tired, depressed, reports he continues to feel lethargic . He also reports sleep issues, states Dr.Lugo had increased his Remeron to 15 mg and that has not helped a lot. It was discussed with pt that Remeron at higher doses may be less sedating ,and so he has decided to stay on the current dose of 15 mg. Discussed adding some Vistaril and he agrees with plan. Pt encouraged to stay compliant on his medications as well as attend group activities. Pt denies any ADR s of medications.      Principal Problem: Severe recurrent major depression without psychotic features (HCC) Diagnosis:   Patient Active Problem List   Diagnosis Date Noted  . Cannabis use disorder, severe, dependence (HCC) [F12.20] 04/09/2015  . Severe recurrent major depression without psychotic features (HCC) [F33.2] 04/08/2015  . Cocaine dependence (HCC) [F14.20] 04/08/2015  . Alcohol abuse [F10.10] 04/08/2015  . Screening examination for venereal disease [Z11.3] 03/25/2015  . Encounter for long-term (current) use of medications [Z79.899] 03/25/2015  . 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 AIDS (HCC) [B20]   . Tobacco abuse [Z72.0]   . Decreased appetite [R63.0]   . 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 CAP (community acquired pneumonia) [J18.9] 03/20/2015  . Leukocytosis [D72.829] 03/20/2015  . Contusion of right knee [S80.01XA] 03/20/2015  . Fall [W19.XXXA] 03/20/2015  . Loss of weight [R63.4] 02/12/2014  . Visual disturbance [H53.9] 11/18/2013  .  Human immunodeficiency virus (HIV) disease (HCC) [B20] 10/30/2013  . Atopic dermatitis [L20.9] 10/30/2013   Total Time spent with patient: 25 minutes  Past Psychiatric History: Pt with hx of depression as well as polysubstance abuse - used to follow up with [facility].  Past Medical History:  Past Medical History  Diagnosis Date  . HIV infection (HCC)   . Depression    Family History:  Family History  Problem Relation Age of Onset  . Diabetes Mother   . Hypertension Mother   . Arthritis Mother    Family Psychiatric  History: Aunt and uncle has hx of substance abuse. 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 2-3 weeks ago broke up with partner  History  Alcohol Use  . 1.2 oz/week  . 2 Standard drinks or equivalent per week    Comment: 3-4 days a week.Last drink: today     History  Drug Use  . Yes  . Special: Marijuana, Cocaine    Comment: Once a week. Last used yesterday.  Cocaine last used today.     Social History   Social History  . Marital Status: Married    Spouse Name: N/A  . Number of Children: N/A  . Years of Education: N/A   Social History Main Topics  . Smoking status: Current Every Day Smoker -- 0.50 packs/day for 25 years    Types: Cigarettes  . Smokeless tobacco: Never Used     Comment: cutting back  . Alcohol Use: 1.2 oz/week    2  Standard drinks or equivalent per week     Comment: 3-4 days a week.Last drink: today  . Drug Use: Yes    Special: Marijuana, Cocaine     Comment: Once a week. Last used yesterday.  Cocaine last used today.   Marland Kitchen Sexual Activity: Not Asked     Comment: encouraged condom use   Other Topics Concern  . None   Social History Narrative   Additional Social History:                         Sleep: pt states so-so  Appetite:  Fair  Current Medications: Current Facility-Administered Medications  Medication Dose  Route Frequency Provider Last Rate Last Dose  . abacavir (ZIAGEN) tablet 600 mg  600 mg Oral Daily Adonis Brook, NP   600 mg at 04/09/15 0754   And  . dolutegravir (TIVICAY) tablet 50 mg  50 mg Oral Daily Adonis Brook, NP   50 mg at 04/09/15 0754   And  . lamiVUDine (EPIVIR) tablet 300 mg  300 mg Oral Daily Adonis Brook, NP   300 mg at 04/09/15 0755  . acetaminophen (TYLENOL) tablet 650 mg  650 mg Oral Q6H PRN Adonis Brook, NP   650 mg at 04/09/15 1326  . alum & mag hydroxide-simeth (MAALOX/MYLANTA) 200-200-20 MG/5ML suspension 30 mL  30 mL Oral Q4H PRN Adonis Brook, NP      . darunavir-cobicistat (PREZCOBIX) 800-150 MG per tablet 1 tablet  1 tablet Oral Q breakfast Rachael Fee, MD   1 tablet at 04/09/15 0802  . docusate sodium (COLACE) capsule 100 mg  100 mg Oral BID Rachael Fee, MD   100 mg at 04/09/15 0759  . dronabinol (MARINOL) capsule 10 mg  10 mg Oral BID Adonis Brook, NP   10 mg at 04/09/15 0806  . feeding supplement (ENSURE ENLIVE) (ENSURE ENLIVE) liquid 237 mL  237 mL Oral BID BM Rachael Fee, MD   237 mL at 04/09/15 1320  . hydrocortisone cream 1 %   Topical QID Rachael Fee, MD      . hydrOXYzine (ATARAX/VISTARIL) tablet 50 mg  50 mg Oral QHS Fathima Bartl, MD      . magnesium hydroxide (MILK OF MAGNESIA) suspension 30 mL  30 mL Oral Daily PRN Adonis Brook, NP      . mirtazapine (REMERON) tablet 15 mg  15 mg Oral QHS Rachael Fee, MD   15 mg at 04/08/15 2146  . nicotine (NICODERM CQ - dosed in mg/24 hours) patch 21 mg  21 mg Transdermal Daily Rachael Fee, MD   21 mg at 04/09/15 1610  . sulfamethoxazole-trimethoprim (BACTRIM DS,SEPTRA DS) 800-160 MG per tablet 1 tablet  1 tablet Oral Daily Adonis Brook, NP   1 tablet at 04/09/15 9604    Lab Results: No results found for this or any previous visit (from the past 48 hour(s)).  Physical Findings: AIMS: Facial and Oral Movements Muscles of Facial Expression: None, normal Lips and Perioral Area: None,  normal Jaw: None, normal Tongue: None, normal,Extremity Movements Upper (arms, wrists, hands, fingers): None, normal Lower (legs, knees, ankles, toes): None, normal, Trunk Movements Neck, shoulders, hips: None, normal, Overall Severity Severity of abnormal movements (highest score from questions above): None, normal Incapacitation due to abnormal movements: None, normal Patient's awareness of abnormal movements (rate only patient's report): No Awareness, Dental Status Current problems with teeth and/or dentures?: No Does patient usually wear dentures?: Yes  CIWA:  CIWA-Ar Total: 1 COWS:     Musculoskeletal: Strength & Muscle Tone: within normal limits Gait & Station: normal Patient leans: N/A  Psychiatric Specialty Exam: Review of Systems  Constitutional: Positive for malaise/fatigue.  Psychiatric/Behavioral: Positive for depression. The patient is nervous/anxious and has insomnia.   All other systems reviewed and are negative.   Blood pressure 126/95, pulse 89, temperature 98 F (36.7 C), temperature source Oral, resp. rate 16, height 5' 4.5" (1.638 m), weight 53.524 kg (118 lb), SpO2 100 %.Body mass index is 19.95 kg/(m^2).  General Appearance: Disheveled  Eye Contact::  Minimal  Speech:  Clear and Coherent  Volume:  Decreased  Mood:  Anxious and Depressed  Affect:  Constricted  Thought Process:  Goal Directed  Orientation:  Full (Time, Place, and Person)  Thought Content:  Rumination  Suicidal Thoughts:  No  Homicidal Thoughts:  No  Memory:  Immediate;   Fair Recent;   Fair Remote;   Fair  Judgement:  Impaired  Insight:  Fair  Psychomotor Activity:  Decreased  Concentration:  Poor  Recall:  [employer] of Knowledge:Fair  Language: Fair  Akathisia:  No    AIMS (if indicated):     Assets:  Communication Skills Desire for Improvement  ADL's:  Intact  Cognition: WNL  Sleep:  Number of Hours: 3   Treatment Plan Summary: Daily contact with patient to assess and  evaluate symptoms and progress in treatment and Medication management  Patient with depression as well as polysubstance abuse , continues to appear withdrawn , has sleep issues. Will continue current treatment plan as per Dr.Lugo. Will add Vistaril 50 mg po qhs for sleep. Pt reports Remeron as not very effective , but wants to stay on the current dose for now. This could be reassessed tomorrow. Remeron could be titrated higher to address his depression, and another sleep aid could be added .  Reviewed past medical records,treatment plan.  Continue Home medications where indicated. Will continue to monitor vitals ,medication compliance and treatment side effects while patient is here.  Will monitor for medical issues as well as call consult as needed.  CSW will start working on disposition.  Patient to participate in therapeutic milieu .      Kenli Waldo md 04/09/2015, 1:52 PM

## 2015-04-09 NOTE — [facility] Group Notes (Signed)
[facility] LCSW Group Therapy  04/09/2015 1:02 PM  Type of Therapy:  Group Therapy  Participation Level:  Did Not Attend-pt invited. Chose to remain in bed.   Modes of Intervention:  Confrontation, Discussion, Education, Exploration, Problem-solving, Rapport Building, Socialization and Support  Summary of Progress/Problems: Feelings around Relapse. Group members discussed the meaning of relapse and shared personal stories of relapse, how it affected them and others, and how they perceived themselves during this time. Group members were encouraged to identify triggers, warning signs and coping skills used when facing the possibility of relapse. Social supports were discussed and explored in detail. Post Acute Withdrawal Syndrome (handout provided) was introduced and examined. Pt's were encouraged to ask questions, talk about key points associated with PAWS, and process this information in terms of relapse prevention.   Foley, Kurt Shima LCSWA  04/09/2015, 1:02 PM

## 2015-04-10 DIAGNOSIS — F1424 Cocaine dependence with cocaine-induced mood disorder: Secondary | ICD-10-CM

## 2015-04-10 DIAGNOSIS — F1994 Other psychoactive substance use, unspecified with psychoactive substance-induced mood disorder: Secondary | ICD-10-CM

## 2015-04-10 DIAGNOSIS — F1220 Cannabis dependence, uncomplicated: Secondary | ICD-10-CM

## 2015-04-10 DIAGNOSIS — F332 Major depressive disorder, recurrent severe without psychotic features: Principal | ICD-10-CM

## 2015-04-10 NOTE — Progress Notes (Signed)
Patient ID: Kurt Foley, male   DOB: [birth year], 39 y.o.   MRN: [id number] [facility]  04/10/2015 12:59 PM Kurt Foley  MRN:  [id number] Subjective: Patient states " I am tired. I did not sleep that well last night.'  Objective:[name] is a [age] y old AAM , with hx of depression , polysubstance abuse including cocaine, alcohol , who presented with worsening depression. Patient seen and chart reviewed.Discussed patient with treatment team. Pt today still appears depressed and tired. He also reports sleep issues, states Dr.Lugo had increased his Remeron to 15 mg and that has not helped a lot. It was discussed with pt that Remeron at higher doses may be less sedating ,and so he has decided to stay on the current dose of 15 mg. Vistaril was started last night has slept somewhat better on it.  Does not appear motivated to attend groups or talk in sessions.   Pt encouraged to stay compliant on his medications as well as attend group activities. Pt denies any ADR s of medications.      Principal Problem: Severe recurrent major depression without psychotic features (HCC) Diagnosis:   Patient Active Problem List   Diagnosis Date Noted  . Cannabis use disorder, severe, dependence (HCC) [F12.20] 04/09/2015  . Severe recurrent major depression without psychotic features (HCC) [F33.2] 04/08/2015  . Cocaine dependence (HCC) [F14.20] 04/08/2015  . Alcohol abuse [F10.10] 04/08/2015  . Screening examination for venereal disease [Z11.3] 03/25/2015  . Encounter for long-term (current) use of medications [Z79.899] 03/25/2015  . 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 AIDS (HCC) [B20]   . Tobacco abuse [Z72.0]   . Decreased appetite [R63.0]   . 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 CAP (community acquired pneumonia) [J18.9] 03/20/2015  . Leukocytosis [D72.829] 03/20/2015  . Contusion of right knee [S80.01XA] 03/20/2015   . Fall [W19.XXXA] 03/20/2015  . Loss of weight [R63.4] 02/12/2014  . Visual disturbance [H53.9] 11/18/2013  . Human immunodeficiency virus (HIV) disease (HCC) [B20] 10/30/2013  . Atopic dermatitis [L20.9] 10/30/2013   Total Time spent with patient: 25 minutes  Past Psychiatric History: Pt with hx of depression as well as polysubstance abuse - used to follow up with [facility].  Past Medical History:  Past Medical History  Diagnosis Date  . HIV infection (HCC)   . Depression    Family History:  Family History  Problem Relation Age of Onset  . Diabetes Mother   . Hypertension Mother   . Arthritis Mother    Family Psychiatric  History: Aunt and uncle has hx of substance abuse. 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 2-3 weeks ago broke up with partner  History  Alcohol Use  . 1.2 oz/week  . 2 Standard drinks or equivalent per week    Comment: 3-4 days a week.Last drink: today     History  Drug Use  . Yes  . Special: Marijuana, Cocaine    Comment: Once a week. Last used yesterday.  Cocaine last used today.     Social History   Social History  . Marital Status: Married    Spouse Name: N/A  . Number of Children: N/A  . Years of Education: N/A   Social History Main Topics  . Smoking status: Current Every Day Smoker -- 0.50 packs/day for 25 years    Types: Cigarettes  .  Smokeless tobacco: Never Used     Comment: cutting back  . Alcohol Use: 1.2 oz/week    2 Standard drinks or equivalent per week     Comment: 3-4 days a week.Last drink: today  . Drug Use: Yes    Special: Marijuana, Cocaine     Comment: Once a week. Last used yesterday.  Cocaine last used today.   Marland Kitchen Sexual Activity: Not Asked     Comment: encouraged condom use   Other Topics Concern  . None   Social History Narrative   Additional Social History:                         Sleep: pt  states so-so  Appetite:  Fair  Current Medications: Current Facility-Administered Medications  Medication Dose Route Frequency Provider Last Rate Last Dose  . Abacavir-Dolutegravir-Lamivud 600-50-300 MG TABS 1 tablet  1 tablet Oral Daily Rachael Fee, MD   1 tablet at 04/10/15 0802  . acetaminophen (TYLENOL) tablet 650 mg  650 mg Oral Q6H PRN Adonis Brook, NP   650 mg at 04/10/15 1610  . alum & mag hydroxide-simeth (MAALOX/MYLANTA) 200-200-20 MG/5ML suspension 30 mL  30 mL Oral Q4H PRN Adonis Brook, NP      . darunavir-cobicistat (PREZCOBIX) 800-150 MG per tablet 1 tablet  1 tablet Oral Q breakfast Rachael Fee, MD   1 tablet at 04/10/15 0757  . docusate sodium (COLACE) capsule 100 mg  100 mg Oral BID Rachael Fee, MD   100 mg at 04/10/15 0757  . dronabinol (MARINOL) capsule 10 mg  10 mg Oral BID Adonis Brook, NP   10 mg at 04/10/15 0756  . feeding supplement (ENSURE ENLIVE) (ENSURE ENLIVE) liquid 237 mL  237 mL Oral BID BM Rachael Fee, MD   237 mL at 04/09/15 1320  . hydrocortisone cream 1 %   Topical QID Rachael Fee, MD      . hydrOXYzine (ATARAX/VISTARIL) tablet 50 mg  50 mg Oral QHS Jomarie Longs, MD   50 mg at 04/09/15 2113  . magnesium hydroxide (MILK OF MAGNESIA) suspension 30 mL  30 mL Oral Daily PRN Adonis Brook, NP      . mirtazapine (REMERON) tablet 15 mg  15 mg Oral QHS Rachael Fee, MD   15 mg at 04/09/15 2113  . nicotine polacrilex (NICORETTE) gum 2 mg  2 mg Oral PRN Rachael Fee, MD   2 mg at 04/10/15 0802  . sulfamethoxazole-trimethoprim (BACTRIM DS,SEPTRA DS) 800-160 MG per tablet 1 tablet  1 tablet Oral Daily Adonis Brook, NP   1 tablet at 04/10/15 0757    Lab Results: No results found for this or any previous visit (from the past 48 hour(s)).  Physical Findings: AIMS: Facial and Oral Movements Muscles of Facial Expression: None, normal Lips and Perioral Area: None, normal Jaw: None, normal Tongue: None, normal,Extremity Movements Upper (arms,  wrists, hands, fingers): None, normal Lower (legs, knees, ankles, toes): None, normal, Trunk Movements Neck, shoulders, hips: None, normal, Overall Severity Severity of abnormal movements (highest score from questions above): None, normal Incapacitation due to abnormal movements: None, normal Patient's awareness of abnormal movements (rate only patient's report): No Awareness, Dental Status Current problems with teeth and/or dentures?: No Does patient usually wear dentures?: Yes  CIWA:  CIWA-Ar Total: 1 COWS:     Musculoskeletal: Strength & Muscle Tone: within normal limits Gait & Station: normal Patient leans: N/A  Psychiatric Specialty  Exam: Review of Systems  Constitutional: Positive for malaise/fatigue.  Psychiatric/Behavioral: Positive for depression. The patient is nervous/anxious and has insomnia.   All other systems reviewed and are negative.   Blood pressure 131/88, pulse 88, temperature 97.7 F (36.5 C), temperature source Oral, resp. rate 20, height 5' 4.5" (1.638 m), weight 53.524 kg (118 lb), SpO2 100 %.Body mass index is 19.95 kg/(m^2).  General Appearance: Disheveled  Eye Contact::  Minimal  Speech:  Clear and Coherent  Volume:  Decreased  Mood:  dysthymic  Affect:  Constricted  Thought Process:  Goal Directed  Orientation:  Full (Time, Place, and Person)  Thought Content:  Rumination  Suicidal Thoughts:  No  Homicidal Thoughts:  No  Memory:  Immediate;   Fair Recent;   Fair Remote;   Fair  Judgement:  Impaired  Insight:  Fair  Psychomotor Activity:  Decreased  Concentration:  Poor  Recall:  [employer] of Knowledge:Fair  Language: Fair  Akathisia:  No    AIMS (if indicated):     Assets:  Communication Skills Desire for Improvement  ADL's:  Intact  Cognition: WNL  Sleep:  Number of Hours: 3.75   Treatment Plan Summary: Daily contact with patient to assess and evaluate symptoms and progress in treatment and Medication management  Patient with  depression as well as polysubstance abuse , continues to appear withdrawn , has sleep issues. Will continue current treatment plan as per Dr.Lugo. Insomnia : Vistaril 50 mg po qhs for sleep. Pt reports Remeron as not very effective , but wants to stay on the current dose for now. Need to be evaluated if continue or change to other alternative. Reviewed past medical records,treatment plan.  Cocaine use has contributed to his depression  Continue Home medications where indicated. Will continue to monitor vitals ,medication compliance and treatment side effects while patient is here.  Will monitor for medical issues as well as call consult as needed.  CSW will start working on disposition.  Patient to participate in therapeutic milieu .      Daziah Hesler md 04/10/2015, 12:59 PM

## 2015-04-10 NOTE — Progress Notes (Signed)
D.  Pt pleasant on approach, denies complaints at this time.  States still having difficulty sleeping, will see how it goes tonight as discussed with doctor, then inquire about possible medication changes tomorrow.  Denies SI/HI/hallucinations at this time.  A.  Support and encouragement offered  R.  Pt remains safe on unit, will continue to monitor.

## 2015-04-10 NOTE — Progress Notes (Signed)
[facility] Group Notes:  (Nursing/MHT/Case Management/Adjunct)  Date:  04/10/2015  Time:  10:57 PM  Type of Therapy:  Psychoeducational Skills  Participation Level:  Minimal  Participation Quality:  Attentive  Affect:  Appropriate  Cognitive:  Appropriate  Insight:  Limited  Engagement in Group:  Limited  Modes of Intervention:  Education  Summary of Progress/Problems: The patient stated in group that he had a good day but did not explain any further. As a theme for the day, his coping skill will be to pray more.   Hazle CocaGOODMAN, Liz Pinho S 04/10/2015, 10:57 PM

## 2015-04-10 NOTE — Plan of Care (Signed)
Problem: Diagnosis: Increased Risk For Suicide Attempt Goal: STG-Patient Will Attend All Groups On The Unit Outcome: Not Progressing Pt has not attended group this weekend

## 2015-04-10 NOTE — Plan of Care (Signed)
Problem: Diagnosis: Increased Risk For Suicide Attempt Goal: STG-Patient Will Comply With Medication Regime Outcome: Progressing Patient is compliant with medication regime.     

## 2015-04-10 NOTE — [facility] Group Notes (Signed)
[facility] Group Notes: (Clinical Social Work)   04/10/2015      Type of Therapy:  Group Therapy   Participation Level:  Did Not Attend despite MHT prompting   Charizma Gardiner Grossman-Orr, LCSW 04/10/2015, 12:14 PM     

## 2015-04-10 NOTE — Progress Notes (Signed)
DAR NOTE: Patient mood and affect remain depressed. Denies pain, auditory and visual hallucinations.  Rates depression at 7/10, hopelessness at 3/10, and anxiety at 5/10.  Maintained on routine safety checks.  Medications given as prescribed.  Support and encouragement offered as needed. States goal for today is "after care."  Patient refused to attend groups after several encouragement.  Patient remained isolative in his room.  Patient only out for meals and medications.

## 2015-04-10 NOTE — [facility] Group Notes (Signed)
[facility] Group Notes:  (Nursing/MHT/Case Management/Adjunct)  Date:  04/10/2015  Time:  0930 Type of Therapy:  Group Therapy  Participation Level:  Did Not Attend   Phaedra Colgate O Iwenekha 04/10/2015, 4:09 PM 

## 2015-04-11 DIAGNOSIS — F1420 Cocaine dependence, uncomplicated: Secondary | ICD-10-CM | POA: Insufficient documentation

## 2015-04-11 DIAGNOSIS — F1424 Cocaine dependence with cocaine-induced mood disorder: Secondary | ICD-10-CM | POA: Insufficient documentation

## 2015-04-11 MED ORDER — MIRTAZAPINE 30 MG PO TABS
30.0000 mg | ORAL_TABLET | Freq: Every day | ORAL | Status: DC
  Administered 2015-04-11: 30 mg via ORAL
  Filled 2015-04-11 (×3): qty 1

## 2015-04-11 MED ORDER — NICOTINE 21 MG/24HR TD PT24
21.0000 mg | MEDICATED_PATCH | Freq: Every day | TRANSDERMAL | Status: DC
  Administered 2015-04-11 – 2015-04-14 (×4): 21 mg via TRANSDERMAL
  Filled 2015-04-11 (×7): qty 1

## 2015-04-11 MED ORDER — HYDROXYZINE HCL 25 MG PO TABS
ORAL_TABLET | ORAL | Status: AC
  Filled 2015-04-11: qty 1

## 2015-04-11 MED ORDER — HYDROXYZINE HCL 25 MG PO TABS
25.0000 mg | ORAL_TABLET | Freq: Four times a day (QID) | ORAL | Status: DC | PRN
  Administered 2015-04-11 – 2015-04-15 (×6): 25 mg via ORAL
  Filled 2015-04-11 (×4): qty 1
  Filled 2015-04-11: qty 10
  Filled 2015-04-11: qty 1

## 2015-04-11 NOTE — [facility] Group Notes (Signed)
[facility] Group Notes: (Clinical Social Work)   04/11/2015      Type of Therapy:  Group Therapy   Participation Level:  Did Not Attend despite MHT prompting   Ambrose MantleMareida Grossman-Orr, LCSW 04/11/2015, 12:49 PM

## 2015-04-11 NOTE — Plan of Care (Signed)
Problem: Ineffective individual coping Goal: STG: Patient will remain free from self harm Outcome: Progressing Patient has not engaged in self harm and denies SI  Problem: Alteration in mood Goal: STG-Patient reports thoughts of self-harm to staff Outcome: Progressing Patient denies thoughts to self harm, no SI.

## 2015-04-11 NOTE — Progress Notes (Signed)
Patient ID: Kurt Foley, male   DOB: 1976-04-18, 39 y.o.   MRN: [id number] [facility] MD Progress Note  04/11/2015 10:49 AM Kurt Foley  MRN:  [id number] Subjective: Patient states " mostly concern about his sleep, altough was in bed during the day. Says cant sleep at night"  Objective:Kurt Foley is a 39 y old AAM , with hx of depression , polysubstance abuse including cocaine, alcohol , who presented with worsening depression. Patient seen and chart reviewed.Discussed patient with treatment team. Pt today still appears  tired. He also reports sleep issues, states Dr.Lugo had increased his Remeron to 15 mg and that has not helped a lot. It was discussed with pt that Remeron at higher doses may be less sedating ,and so he has decided to stay on the current dose of 15 mg in the past. Now feels that dose may consider to increase.  Vistaril also prn for sleep and anxiety Patient is homeless and has concerns about his discharge planning Does not appear motivated to attend groups or talk in sessions.   Pt encouraged to stay compliant on his medications as well as attend group activities. Pt denies any ADR s of medications.      Principal Problem: Severe recurrent major depression without psychotic features (HCC) Diagnosis:   Patient Active Problem List   Diagnosis Date Noted  . Cannabis use disorder, severe, dependence (HCC) [F12.20] 04/09/2015  . Severe recurrent major depression without psychotic features (HCC) [F33.2] 04/08/2015  . Cocaine dependence (HCC) [F14.20] 04/08/2015  . Alcohol abuse [F10.10] 04/08/2015  . Screening examination for venereal disease [Z11.3] 03/25/2015  . Encounter for long-term (current) use of medications [Z79.899] 03/25/2015  . 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 AIDS (HCC) [B20]   . Tobacco abuse [Z72.0]   . Decreased appetite [R63.0]   . 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 CAP  (community acquired pneumonia) [J18.9] 03/20/2015  . Leukocytosis [D72.829] 03/20/2015  . Contusion of right knee [S80.01XA] 03/20/2015  . Fall [W19.XXXA] 03/20/2015  . Loss of weight [R63.4] 02/12/2014  . Visual disturbance [H53.9] 11/18/2013  . Human immunodeficiency virus (HIV) disease (HCC) [B20] 10/30/2013  . Atopic dermatitis [L20.9] 10/30/2013   Total Time spent with patient: 25 minutes  Past Psychiatric History: Pt with hx of depression as well as polysubstance abuse - used to follow up with [facility].  Past Medical History:  Past Medical History  Diagnosis Date  . HIV infection (HCC)   . Depression    Family History:  Family History  Problem Relation Age of Onset  . Diabetes Mother   . Hypertension Mother   . Arthritis Mother    Family Psychiatric  History: Aunt and uncle has hx of substance abuse. 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 2-3 weeks ago broke up with partner  History  Alcohol Use  . 1.2 oz/week  . 2 Standard drinks or equivalent per week    Comment: 3-4 days a week.Last drink: today     History  Drug Use  . Yes  . Special: Marijuana, Cocaine    Comment: Once a week. Last used yesterday.  Cocaine last used today.     Social History   Social History  . Marital Status: Married    Spouse Name: N/A  . Number of Children: N/A  . Years of Education: N/A   Social  History Main Topics  . Smoking status: Current Every Day Smoker -- 0.50 packs/day for 25 years    Types: Cigarettes  . Smokeless tobacco: Never Used     Comment: cutting back  . Alcohol Use: 1.2 oz/week    2 Standard drinks or equivalent per week     Comment: 3-4 days a week.Last drink: today  . Drug Use: Yes    Special: Marijuana, Cocaine     Comment: Once a week. Last used yesterday.  Cocaine last used today.   Marland Kitchen Sexual Activity: Not Asked     Comment: encouraged condom use    Other Topics Concern  . None   Social History Narrative   Additional Social History:                         Sleep: pt states so-so  Appetite:  Fair  Current Medications: Current Facility-Administered Medications  Medication Dose Route Frequency Provider Last Rate Last Dose  . Abacavir-Dolutegravir-Lamivud 600-50-300 MG TABS 1 tablet  1 tablet Oral Daily Rachael Fee, MD   1 tablet at 04/11/15 [phone]  . acetaminophen (TYLENOL) tablet 650 mg  650 mg Oral Q6H PRN Adonis Brook, NP   650 mg at 04/11/15 0826  . alum & mag hydroxide-simeth (MAALOX/MYLANTA) 200-200-20 MG/5ML suspension 30 mL  30 mL Oral Q4H PRN Adonis Brook, NP      . darunavir-cobicistat (PREZCOBIX) 800-150 MG per tablet 1 tablet  1 tablet Oral Q breakfast Rachael Fee, MD   1 tablet at 04/11/15 [phone]  . docusate sodium (COLACE) capsule 100 mg  100 mg Oral BID Rachael Fee, MD   100 mg at 04/11/15 5409  . dronabinol (MARINOL) capsule 10 mg  10 mg Oral BID Adonis Brook, NP   10 mg at 04/11/15 0826  . feeding supplement (ENSURE ENLIVE) (ENSURE ENLIVE) liquid 237 mL  237 mL Oral BID BM Rachael Fee, MD   237 mL at 04/09/15 1320  . hydrocortisone cream 1 %   Topical QID Rachael Fee, MD      . hydrOXYzine (ATARAX/VISTARIL) tablet 50 mg  50 mg Oral QHS Jomarie Longs, MD   50 mg at 04/10/15 2059  . magnesium hydroxide (MILK OF MAGNESIA) suspension 30 mL  30 mL Oral Daily PRN Adonis Brook, NP      . mirtazapine (REMERON) tablet 30 mg  30 mg Oral QHS Thresa Ross, MD      . nicotine (NICODERM CQ - dosed in mg/24 hours) patch 21 mg  21 mg Transdermal Daily Rachael Fee, MD   21 mg at 04/11/15 0726  . sulfamethoxazole-trimethoprim (BACTRIM DS,SEPTRA DS) 800-160 MG per tablet 1 tablet  1 tablet Oral Daily Adonis Brook, NP   1 tablet at 04/11/15 8119    Lab Results: No results found for this or any previous visit (from the past 48 hour(s)).  Physical Findings: AIMS: Facial and Oral Movements Muscles of  Facial Expression: None, normal Lips and Perioral Area: None, normal Jaw: None, normal Tongue: None, normal,Extremity Movements Upper (arms, wrists, hands, fingers): None, normal Lower (legs, knees, ankles, toes): None, normal, Trunk Movements Neck, shoulders, hips: None, normal, Overall Severity Severity of abnormal movements (highest score from questions above): None, normal Incapacitation due to abnormal movements: None, normal Patient's awareness of abnormal movements (rate only patient's report): No Awareness, Dental Status Current problems with teeth and/or dentures?: No Does patient usually wear dentures?: Yes  CIWA:  CIWA-Ar  Total: 1 COWS:     Musculoskeletal: Strength & Muscle Tone: within normal limits Gait & Station: normal Patient leans: N/A  Psychiatric Specialty Exam: Review of Systems  Constitutional: Positive for malaise/fatigue.  Neurological: Negative for tremors.  Psychiatric/Behavioral: Positive for depression. The patient is nervous/anxious and has insomnia.   All other systems reviewed and are negative.   Blood pressure 110/86, pulse 81, temperature 97.8 F (36.6 C), temperature source Oral, resp. rate 16, height 5' 4.5" (1.638 m), weight 53.524 kg (118 lb), SpO2 100 %.Body mass index is 19.95 kg/(m^2).  General Appearance: Disheveled  Eye Contact::  Minimal  Speech:  Clear and Coherent  Volume:  Decreased  Mood:  dysthymic  Affect:  Constricted  Thought Process:  Goal Directed  Orientation:  Full (Time, Place, and Person)  Thought Content:  Rumination  Suicidal Thoughts:  No  Homicidal Thoughts:  No  Memory:  Immediate;   Fair Recent;   Fair Remote;   Fair  Judgement:  Impaired  Insight:  Fair  Psychomotor Activity:  Decreased  Concentration:  Poor  Recall:  [employer]  Fund of Knowledge:Fair  Language: Fair  Akathisia:  No    AIMS (if indicated):     Assets:  Communication Skills Desire for Improvement  ADL's:  Intact  Cognition: WNL  Sleep:   Number of Hours: 4.75   Treatment Plan Summary: Daily contact with patient to assess and evaluate symptoms and progress in treatment and Medication management  Patient with depression as well as polysubstance abuse , continues to appear withdrawn , has sleep issues. Will continue current treatment plan as per Dr.Lugo. Insomnia : Vistaril 50 mg po qhs for sleep. Depression and insomnia: increase remeron 30mg  qhs. Review options if it doesn't help in regard to sleep.  Reviewed past medical records,treatment plan.  Cocaine use has contributed to his depression  Continue Home medications where indicated. Will continue to monitor vitals ,medication compliance and treatment side effects while patient is here.  Will monitor for medical issues as well as call consult as needed.  CSW will start working on disposition. Patient is homeless and has concerns of his after discharge plan.   Patient to participate in therapeutic milieu .      Kayle Correa md 04/11/2015, 10:49 AM

## 2015-04-11 NOTE — Progress Notes (Signed)
Patient did not attend the evening speaker AA meeting. Pt was notified that group was beginning but returned to his room.   

## 2015-04-11 NOTE — [facility] Group Notes (Signed)
[facility] Group Notes:  (Nursing/MHT/Case Management/Adjunct)  Date:  04/11/2015  Time:  1000  Type of Therapy:  Nurse Education - Healthy Support Systems  Participation Level:  Did Not Attend  Participation Quality:    Affect:    Cognitive:    Insight:    Engagement in Group:    Modes of Intervention:    Summary of Progress/Problems: Patient was invited to group however remained in bed.   Merian CapronFriedman, [place] [facility] [facility] Hospital[facility]akes 04/11/2015, [phone]

## 2015-04-11 NOTE — Progress Notes (Signed)
Patient has remained in the bed today. States his sleep was poor last night and he also complains of back pain of a 10/10. Does get up for meals and after lunch requested something for anxiety. Affect blunted, mood neutral. Patient medicated per orders. Order received for prn vistaril which was given with good results (patient asleep on recheck). Tylenol given for pain and patient asleep on reassessment. Denies SI/HI/AVH and remains safe. Lawrence MarseillesFriedman, Cer[place] Randhawa Eakes

## 2015-04-12 MED ORDER — QUETIAPINE FUMARATE 100 MG PO TABS
100.0000 mg | ORAL_TABLET | Freq: Every day | ORAL | Status: DC
  Administered 2015-04-12: 100 mg via ORAL
  Filled 2015-04-12 (×2): qty 1

## 2015-04-12 MED ORDER — GABAPENTIN 100 MG PO CAPS: 100.0000 mg | ORAL_CAPSULE | Freq: Three times a day (TID) | ORAL | Status: DC

## 2015-04-12 MED ORDER — GABAPENTIN 100 MG PO CAPS
200.0000 mg | ORAL_CAPSULE | Freq: Three times a day (TID) | ORAL | Status: DC
  Administered 2015-04-12 – 2015-04-13 (×3): 200 mg via ORAL
  Filled 2015-04-12 (×7): qty 2

## 2015-04-12 MED ORDER — TUBERCULIN PPD 5 UNIT/0.1ML ID SOLN
5.0000 [IU] | Freq: Once | INTRADERMAL | Status: AC
  Administered 2015-04-12: 5 [IU] via INTRADERMAL

## 2015-04-12 NOTE — Progress Notes (Signed)
[facility] MD Progress Note  04/12/2015 1:01 PM [place]  MRN:  [id number] Subjective:  Kurt Foley states that he is still feeling anxious and that he is not sleeping. He wants to go to a residential treatment program. States he needs more help than being here the "few days" admits he feels more irritated and has been feeling like this for couple of days now. Principal Problem: Severe recurrent major depression without psychotic features (HCC) Diagnosis:   Patient Active Problem List   Diagnosis Date Noted  . Cocaine dependence with cocaine-induced mood disorder (HCC) [F14.24]   . Cannabis use disorder, severe, dependence (HCC) [F12.20] 04/09/2015  . Severe recurrent major depression without psychotic features (HCC) [F33.2] 04/08/2015  . Cocaine dependence (HCC) [F14.20] 04/08/2015  . Alcohol abuse [F10.10] 04/08/2015  . Screening examination for venereal disease [Z11.3] 03/25/2015  . Encounter for long-term (current) use of medications [Z79.899] 03/25/2015  . 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 AIDS (HCC) [B20]   . Tobacco abuse [Z72.0]   . Decreased appetite [R63.0]   . 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 CAP (community acquired pneumonia) [J18.9] 03/20/2015  . Leukocytosis [D72.829] 03/20/2015  . Contusion of right knee [S80.01XA] 03/20/2015  . Fall [W19.XXXA] 03/20/2015  . Loss of weight [R63.4] 02/12/2014  . Visual disturbance [H53.9] 11/18/2013  . Human immunodeficiency virus (HIV) disease (HCC) [B20] 10/30/2013  . Atopic dermatitis [L20.9] 10/30/2013   Total Time spent with patient: 30 minutes  Past Psychiatric History: see Admission H and P  Past Medical History:  Past Medical History  Diagnosis Date  . HIV infection (HCC)   . Depression    History reviewed. No pertinent past surgical history. Family History:  Family History  Problem Relation Age of Onset  . Diabetes Mother   . Hypertension Mother    . Arthritis Mother    Family Psychiatric  History: see Admission H and P Social History:  History  Alcohol Use  . 1.2 oz/week  . 2 Standard drinks or equivalent per week    Comment: 3-4 days a week.Last drink: today     History  Drug Use  . Yes  . Special: Marijuana, Cocaine    Comment: Once a week. Last used yesterday.  Cocaine last used today.     Social History   Social History  . Marital Status: Married    Spouse Name: N/A  . Number of Children: N/A  . Years of Education: N/A   Social History Main Topics  . Smoking status: Current Every Day Smoker -- 0.50 packs/day for 25 years    Types: Cigarettes  . Smokeless tobacco: Never Used     Comment: cutting back  . Alcohol Use: 1.2 oz/week    2 Standard drinks or equivalent per week     Comment: 3-4 days a week.Last drink: today  . Drug Use: Yes    Special: Marijuana, Cocaine     Comment: Once a week. Last used yesterday.  Cocaine last used today.   Marland Kitchen Sexual Activity: Not Asked     Comment: encouraged condom use   Other Topics Concern  . None   Social History Narrative   Additional Social History:                         Sleep: Poor  Appetite:  Fair  Current Medications: Current Facility-Administered Medications  Medication Dose Route Frequency Provider Last Rate  Last Dose  . Abacavir-Dolutegravir-Lamivud 600-50-300 MG TABS 1 tablet  1 tablet Oral Daily Rachael Fee, MD   1 tablet at 04/12/15 [id number]  . acetaminophen (TYLENOL) tablet 650 mg  650 mg Oral Q6H PRN Adonis Brook, NP   650 mg at 04/12/15 1036  . alum & mag hydroxide-simeth (MAALOX/MYLANTA) 200-200-20 MG/5ML suspension 30 mL  30 mL Oral Q4H PRN Adonis Brook, NP      . darunavir-cobicistat (PREZCOBIX) 800-150 MG per tablet 1 tablet  1 tablet Oral Q breakfast Rachael Fee, MD   1 tablet at 04/12/15 0850  . docusate sodium (COLACE) capsule 100 mg  100 mg Oral BID Rachael Fee, MD   100 mg at 04/12/15 0851  . dronabinol (MARINOL) capsule  10 mg  10 mg Oral BID Adonis Brook, NP   10 mg at 04/12/15 6213  . feeding supplement (ENSURE ENLIVE) (ENSURE ENLIVE) liquid 237 mL  237 mL Oral BID BM Rachael Fee, MD   237 mL at 04/12/15 1036  . hydrocortisone cream 1 %   Topical QID Rachael Fee, MD      . hydrOXYzine (ATARAX/VISTARIL) tablet 25 mg  25 mg Oral QID PRN Shuvon B Rankin, NP   25 mg at 04/12/15 0854  . magnesium hydroxide (MILK OF MAGNESIA) suspension 30 mL  30 mL Oral Daily PRN Adonis Brook, NP      . mirtazapine (REMERON) tablet 30 mg  30 mg Oral QHS Thresa Ross, MD   30 mg at 04/11/15 2115  . nicotine (NICODERM CQ - dosed in mg/24 hours) patch 21 mg  21 mg Transdermal Daily Rachael Fee, MD   21 mg at 04/12/15 0847  . sulfamethoxazole-trimethoprim (BACTRIM DS,SEPTRA DS) 800-160 MG per tablet 1 tablet  1 tablet Oral Daily Adonis Brook, NP   1 tablet at 04/12/15 0865    Lab Results: No results found for this or any previous visit (from the past 48 hour(s)).  Physical Findings: AIMS: Facial and Oral Movements Muscles of Facial Expression: None, normal Lips and Perioral Area: None, normal Jaw: None, normal Tongue: None, normal,Extremity Movements Upper (arms, wrists, hands, fingers): None, normal Lower (legs, knees, ankles, toes): None, normal, Trunk Movements Neck, shoulders, hips: None, normal, Overall Severity Severity of abnormal movements (highest score from questions above): None, normal Incapacitation due to abnormal movements: None, normal Patient's awareness of abnormal movements (rate only patient's report): No Awareness, Dental Status Current problems with teeth and/or dentures?: No Does patient usually wear dentures?: Yes  CIWA:  CIWA-Ar Total: 1 COWS:     Musculoskeletal: Strength & Muscle Tone: within normal limits Gait & Station: normal Patient leans: normal  Psychiatric Specialty Exam: Review of Systems  Constitutional: Negative.   Eyes: Negative.   Respiratory: Negative.    Cardiovascular: Negative.   Gastrointestinal: Negative.   Genitourinary: Negative.   Musculoskeletal: Positive for neck pain.  Skin: Negative.   Neurological: Negative.   Endo/Heme/Allergies: Negative.   Psychiatric/Behavioral: Positive for depression and substance abuse. The patient is nervous/anxious and has insomnia.     Blood pressure 114/74, pulse 90, temperature 97.8 F (36.6 C), temperature source Oral, resp. rate 18, height 5' 4.5" (1.638 m), weight 53.524 kg (118 lb), SpO2 100 %.Body mass index is 19.95 kg/(m^2).  General Appearance: Fairly Groomed  Patent attorney::  Minimal  Speech:  Clear and Coherent  Volume:  Decreased  Mood:  Anxious, Depressed and Dysphoric  Affect:  Restricted  Thought Process:  Coherent and Goal Directed  Orientation:  Full (Time, Place, and Person)  Thought Content:  symptoms events worries concerns  Suicidal Thoughts:  No  Homicidal Thoughts:  No  Memory:  Immediate;   Fair Recent;   Fair Remote;   Fair  Judgement:  Fair  Insight:  Present and Shallow  Psychomotor Activity:  Restlessness  Concentration:  Fair  Recall:  FiservFair  Fund of Knowledge:Fair  Language: Fair  Akathisia:  No  Handed:  Right  AIMS (if indicated):     Assets:  Desire for Improvement  ADL's:  Intact  Cognition: WNL  Sleep:  Number of Hours: 4.75   Treatment Plan Summary: Daily contact with patient to assess and evaluate symptoms and progress in treatment and Medication management Supportive approach/coping skills Alcohol and drug abuse/dependence; continue working a relapse prevention plan Mood instability; will start Neurontin 200 mg TID Insomnia/mood instability; will start Seroquel 100 mg HS ( Trazodone, Remeron have not help) he states that his irritability comes from not being able to sleep and have a clear idea of where is he going from here. Use CBT/mindfulness Explore residential treatment options Annalise Mcdiarmid A 04/12/2015, 1:01 PM

## 2015-04-12 NOTE — Progress Notes (Signed)
D:Pt reports feeling dizzy this morning and c/o anxiety. Pt reports that he did not sleep well last night and medication changes have not helped so far. A:Gave prn vistaril, gatorade and fall safety. Offered support and 15 minute checks. R:Safety maintained on the unit.

## 2015-04-12 NOTE — Clinical Social Work Note (Addendum)
CSW contacted Shayla at [facility]-pt information received-no beds available today. CSW contacted [facility]-Thursday Nov 3rd at 8:00AM. (Screening for possible admission).   Trula SladeHeather Smart, LCSWA Clinical Social Worker 04/12/2015 11:03 AM     [facility] today [facility]. CSW requested that MD order TB test.   Trula SladeHeather Smart, LCSWA Clinical Social Worker 04/12/2015 3:39 PM

## 2015-04-12 NOTE — Plan of Care (Signed)
Problem: Ineffective individual coping Goal: STG: Patient will remain free from self harm Outcome: Progressing Pt denies si thoughts this morning.     

## 2015-04-12 NOTE — [facility] Group Notes (Signed)
[facility]   04/12/2015 9:32 AM  Participation Quality:  Invited. DID NOT ATTEND. Chose to remain in bed.   Smart, American FinancialHeather LCSWA

## 2015-04-12 NOTE — [facility] Group Notes (Signed)
[facility] LCSW Group Therapy  04/12/2015 12:58 PM  Type of Therapy:  Group Therapy  Participation Level:  Active  Participation Quality:  Attentive  Affect:  Appropriate  Cognitive:  Oriented  Insight:  Limited  Engagement in Therapy:  Improving  Modes of Intervention:  Confrontation, Discussion, Education, Problem-solving, Rapport Building, Socialization and Support  Summary of Progress/Problems: Today's Topic: Overcoming Obstacles. Patients identified one short term goal and potential obstacles in reaching this goal. Patients processed barriers involved in overcoming these obstacles. Patients identified steps necessary for overcoming these obstacles and explored motivation (internal and external) for facing these difficulties head on. Kurt Foley was attentive and engaged during today's processing group. Kurt Foley shared that his goal was to get placed for further stabilization and substance abuse treatment at a longer term facility. He stated that he needs help getting mentally stable and then he can focus on substance abuse treatment. He reports having no family supports and limited supports within the community. He shows improving insight and progress in the group setting. He was receptive to filling out an application for [facility].   Smart, Kurt Foley LCSWA  04/12/2015, 12:58 PM

## 2015-04-12 NOTE — Progress Notes (Signed)
D. Pt up and visible in milieu, attended and participated in evening group activity. Pt seen interacting appropriately with peers. Pt had complaints of insomnia this evening and has been up for a good portion of the morning hours not being able to sleep well. A. Medications provided as ordered. R. Will continue to monitor.

## 2015-04-13 MED ORDER — DRONABINOL 5 MG PO CAPS
5.0000 mg | ORAL_CAPSULE | Freq: Three times a day (TID) | ORAL | Status: DC
  Administered 2015-04-13 – 2015-04-15 (×6): 5 mg via ORAL
  Filled 2015-04-13 (×6): qty 1

## 2015-04-13 MED ORDER — DRONABINOL 5 MG PO CAPS
ORAL_CAPSULE | ORAL | Status: AC
Start: 2015-04-13 — End: 2015-04-13
  Filled 2015-04-13: qty 1

## 2015-04-13 MED ORDER — GABAPENTIN 300 MG PO CAPS
300.0000 mg | ORAL_CAPSULE | Freq: Three times a day (TID) | ORAL | Status: DC
  Administered 2015-04-13 – 2015-04-15 (×7): 300 mg via ORAL
  Filled 2015-04-13 (×2): qty 1
  Filled 2015-04-13: qty 28
  Filled 2015-04-13: qty 1
  Filled 2015-04-13 (×4): qty 28
  Filled 2015-04-13 (×2): qty 1
  Filled 2015-04-13: qty 28
  Filled 2015-04-13 (×6): qty 1

## 2015-04-13 MED ORDER — QUETIAPINE FUMARATE 200 MG PO TABS
200.0000 mg | ORAL_TABLET | Freq: Every day | ORAL | Status: DC
  Administered 2015-04-13 – 2015-04-14 (×2): 200 mg via ORAL
  Filled 2015-04-13 (×2): qty 1
  Filled 2015-04-13 (×2): qty 7
  Filled 2015-04-13: qty 1

## 2015-04-13 MED ORDER — GABAPENTIN 300 MG PO CAPS
300.0000 mg | ORAL_CAPSULE | Freq: Every day | ORAL | Status: DC
Start: 2015-04-13 — End: 2015-04-15
  Administered 2015-04-13 – 2015-04-14 (×2): 300 mg via ORAL
  Filled 2015-04-13 (×5): qty 1

## 2015-04-13 NOTE — Progress Notes (Signed)
[facility] MD Progress Note  04/13/2015 5:43 PM Kurt Foley  MRN:  [id number] Subjective:  Corban continues to work on getting his life back together. He was trying to get into [facility] but he has an old assault charge in his record. He also later found out that there was a warrant out for his arrest as he missed a court date. States he is feeling pretty overwhelmed as he does not know where to go from here now Principal Problem: Severe recurrent major depression without psychotic features (HCC) Diagnosis:   Patient Active Problem List   Diagnosis Date Noted  . Cocaine dependence with cocaine-induced mood disorder (HCC) [F14.24]   . Cannabis use disorder, severe, dependence (HCC) [F12.20] 04/09/2015  . Severe recurrent major depression without psychotic features (HCC) [F33.2] 04/08/2015  . Cocaine dependence (HCC) [F14.20] 04/08/2015  . Alcohol abuse [F10.10] 04/08/2015  . Screening examination for venereal disease [Z11.3] 03/25/2015  . Encounter for long-term (current) use of medications [Z79.899] 03/25/2015  . 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 AIDS (HCC) [B20]   . Tobacco abuse [Z72.0]   . Decreased appetite [R63.0]   . 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 CAP (community acquired pneumonia) [J18.9] 03/20/2015  . Leukocytosis [D72.829] 03/20/2015  . Contusion of right knee [S80.01XA] 03/20/2015  . Fall [W19.XXXA] 03/20/2015  . Loss of weight [R63.4] 02/12/2014  . Visual disturbance [H53.9] 11/18/2013  . Human immunodeficiency virus (HIV) disease (HCC) [B20] 10/30/2013  . Atopic dermatitis [L20.9] 10/30/2013   Total Time spent with patient: 30 minutes  Past Psychiatric History: see admission H and P  Past Medical History:  Past Medical History  Diagnosis Date  . HIV infection (HCC)   . Depression    History reviewed. No pertinent past surgical history. Family History:  Family History  Problem Relation Age of  Onset  . Diabetes Mother   . Hypertension Mother   . Arthritis Mother    Family Psychiatric  History: see admission H and P Social History:  History  Alcohol Use  . 1.2 oz/week  . 2 Standard drinks or equivalent per week    Comment: 3-4 days a week.Last drink: today     History  Drug Use  . Yes  . Special: Marijuana, Cocaine    Comment: Once a week. Last used yesterday.  Cocaine last used today.     Social History   Social History  . Marital Status: Married    Spouse Name: N/A  . Number of Children: N/A  . Years of Education: N/A   Social History Main Topics  . Smoking status: Current Every Day Smoker -- 0.50 packs/day for 25 years    Types: Cigarettes  . Smokeless tobacco: Never Used     Comment: cutting back  . Alcohol Use: 1.2 oz/week    2 Standard drinks or equivalent per week     Comment: 3-4 days a week.Last drink: today  . Drug Use: Yes    Special: Marijuana, Cocaine     Comment: Once a week. Last used yesterday.  Cocaine last used today.   Marland Kitchen Sexual Activity: Not Asked     Comment: encouraged condom use   Other Topics Concern  . None   Social History Narrative   Additional Social History:                         Sleep: better   Appetite:  Fair  Current Medications: Current Facility-Administered Medications  Medication Dose Route Frequency Provider Last Rate Last Dose  . Abacavir-Dolutegravir-Lamivud 600-50-300 MG TABS 1 tablet  1 tablet Oral Daily Rachael FeeIrving A Jannessa Ogden, MD   600 mg at 04/13/15 0739  . acetaminophen (TYLENOL) tablet 650 mg  650 mg Oral Q6H PRN Adonis BrookSheila Agustin, NP   650 mg at 04/13/15 1650  . alum & mag hydroxide-simeth (MAALOX/MYLANTA) 200-200-20 MG/5ML suspension 30 mL  30 mL Oral Q4H PRN Adonis BrookSheila Agustin, NP      . darunavir-cobicistat (PREZCOBIX) 800-150 MG per tablet 1 tablet  1 tablet Oral Q breakfast Rachael FeeIrving A Donelle Hise, MD   1 tablet at 04/13/15 0739  . docusate sodium (COLACE) capsule 100 mg  100 mg Oral BID Rachael FeeIrving A Shareese Macha, MD   100  mg at 04/13/15 1650  . dronabinol (MARINOL) capsule 5 mg  5 mg Oral TID AC Rachael FeeIrving A Romen Yutzy, MD   5 mg at 04/13/15 1650  . feeding supplement (ENSURE ENLIVE) (ENSURE ENLIVE) liquid 237 mL  237 mL Oral BID BM Rachael FeeIrving A Koren Plyler, MD   237 mL at 04/13/15 1438  . gabapentin (NEURONTIN) capsule 300 mg  300 mg Oral TID Rachael FeeIrving A Esa Raden, MD   300 mg at 04/13/15 1652  . gabapentin (NEURONTIN) capsule 300 mg  300 mg Oral QHS Rachael FeeIrving A Anuar Walgren, MD      . hydrocortisone cream 1 %   Topical QID Rachael FeeIrving A Colton Tassin, MD      . hydrOXYzine (ATARAX/VISTARIL) tablet 25 mg  25 mg Oral QID PRN Shuvon B Rankin, NP   25 mg at 04/12/15 0854  . magnesium hydroxide (MILK OF MAGNESIA) suspension 30 mL  30 mL Oral Daily PRN Adonis BrookSheila Agustin, NP      . nicotine (NICODERM CQ - dosed in mg/24 hours) patch 21 mg  21 mg Transdermal Daily Rachael FeeIrving A Myrka Sylva, MD   21 mg at 04/13/15 0742  . QUEtiapine (SEROQUEL) tablet 200 mg  200 mg Oral QHS Rachael FeeIrving A Camille Dragan, MD      . sulfamethoxazole-trimethoprim (BACTRIM DS,SEPTRA DS) 800-160 MG per tablet 1 tablet  1 tablet Oral Daily Adonis BrookSheila Agustin, NP   1 tablet at 04/13/15 0739  . tuberculin injection 5 Units  5 Units Intradermal Once Rachael FeeIrving A Phala Schraeder, MD   5 Units at 04/12/15 2013    Lab Results: No results found for this or any previous visit (from the past 48 hour(s)).  Physical Findings: AIMS: Facial and Oral Movements Muscles of Facial Expression: None, normal Lips and Perioral Area: None, normal Jaw: None, normal Tongue: None, normal,Extremity Movements Upper (arms, wrists, hands, fingers): None, normal Lower (legs, knees, ankles, toes): None, normal, Trunk Movements Neck, shoulders, hips: None, normal, Overall Severity Severity of abnormal movements (highest score from questions above): None, normal Incapacitation due to abnormal movements: None, normal Patient's awareness of abnormal movements (rate only patient's report): No Awareness, Dental Status Current problems with teeth and/or dentures?: No Does  patient usually wear dentures?: Yes  CIWA:  CIWA-Ar Total: 1 COWS:     Musculoskeletal: Strength & Muscle Tone: within normal limits Gait & Station: normal Patient leans: normal  Psychiatric Specialty Exam: Review of Systems  Constitutional: Negative.   HENT: Negative.   Eyes: Negative.   Respiratory: Negative.   Cardiovascular: Negative.   Gastrointestinal: Negative.   Genitourinary: Negative.   Musculoskeletal: Positive for back pain.  Skin: Negative.   Neurological: Negative.   Endo/Heme/Allergies: Negative.   Psychiatric/Behavioral: Positive for depression and substance abuse.  Blood pressure 120/102, pulse 102, temperature 97.8 F (36.6 C), temperature source Oral, resp. rate 16, height 5' 4.5" (1.638 m), weight 53.524 kg (118 lb), SpO2 100 %.Body mass index is 19.95 kg/(m^2).  General Appearance: Fairly Groomed  Patent attorney::  Fair  Speech:  Clear and Coherent  Volume:  Decreased  Mood:  Anxious and Depressed  Affect:  Restricted  Thought Process:  Coherent and Goal Directed  Orientation:  Full (Time, Place, and Person)  Thought Content:  symptoms events worries concerns  Suicidal Thoughts:  No  Homicidal Thoughts:  No  Memory:  Immediate;   Fair Recent;   Fair Remote;   Fair  Judgement:  Fair  Insight:  Shallow  Psychomotor Activity:  Restlessness  Concentration:  Fair  Recall:  [employer] of Knowledge:Fair  Language: Fair  Akathisia:  No  Handed:  Right  AIMS (if indicated):     Assets:  Desire for Improvement  ADL's:  Intact  Cognition: WNL  Sleep:  Number of Hours: 4.75   Treatment Plan Summary: Daily contact with patient to assess and evaluate symptoms and progress in treatment and Medication management Supportive approach/coping skills Polysubstance dependence; work a relapse prevention plan Mood instability/insomnia; will increase the Seroquel to 200 mg HS Pain/anxiety; will continue the Neurontin Decreased appetite; will resume the Marinol  and D/C the Megace as he is not going to [facility] anymore Work with CBT/mindfulness Explore placement options Sia Gabrielsen A 04/13/2015, 5:43 PM

## 2015-04-13 NOTE — Progress Notes (Signed)
Pt reports he has been anxious today.  His main complaint tonight is a headache he has had most of the day.  He denies any withdrawal symptoms at this time.  He has been interacting with his roommate in the dayroom and watching TV.  His discharge plans are in process, and he is looking to go for long term treatment.  Pt makes his needs known to staff.  Support and encouragement offered. Safety maintained with q15 minute checks.

## 2015-04-13 NOTE — Progress Notes (Signed)
Recreation Therapy Notes  Animal-Assisted Activity (AAA) Program Checklist/Progress Notes Patient Eligibility Criteria Checklist & Daily Group note for Rec Tx Intervention  Date: 11.01.2016 Time: 2:15pm [address]   AAA/T Program Assumption of Risk Form signed by Patient/ or Parent Legal Guardian yes  Patient is free of allergies or sever asthma yes  Patient reports no fear of animals yes  Patient reports no history of cruelty to animals yes  Patient understands his/her participation is voluntary yes  Patient washes hands before animal contact yes  Patient washes hands after animal contact yes  Behavioral Response: Appropriate   Education: Hand Washing, Appropriate Animal Interaction   Education Outcome: Acknowledges education.   Clinical Observations/Feedback: Patient interacted appropriately with therapy dog, petting him and interacting with peers appropriately during session.   Marykay Lexenise L Conchetta Lamia, LRT/CTRS  Kendy Haston L 04/13/2015 2:30 PM

## 2015-04-13 NOTE — Progress Notes (Signed)
Patient in day room first of shift interacting with peers.  Denies SI/HI/AVH. States he feels better today.  Will continue to monitor every 15 mionutes.

## 2015-04-13 NOTE — [facility] Group Notes (Signed)
[facility] LCSW Group Therapy  04/13/2015 2:02 PM  Type of Therapy:  Group Therapy  Participation Level:  Minimal  Participation Quality:  Attentive  Affect:  Depressed  Cognitive:  Oriented  Insight:  Improving  Engagement in Therapy:  Limited  Modes of Intervention:  Confrontation, Discussion, Education, Exploration, Problem-solving, Rapport Building, Socialization and Support  Summary of Progress/Problems:[facility] speaker was out today. CSW provided handouts and educational information pertaining to groups and services offered by [facility]. CSW also provided information for pts [facility](IRC pamphlet, AA/NA information, community resources, [facility] houses). Patients were given the opportunity to share their experiences with these resources in the community and add to the list to provide additional resources to the group. Kurt Foley was attentive during group but did not actively participate in group discussion unless prompted by CSW. Kurt Foley shared that he was scared to turn himself in (warrant for his arrest) at discharge and did not know what to do. He was able to process pros and cons of his choices and decided to call the police dept for more information and to speak with his public defender.   Smart, Kurt Foley LCSWA  04/13/2015, 2:02 PM

## 2015-04-13 NOTE — Progress Notes (Signed)
[facility] Group Notes:  (Nursing/MHT/Case Management/Adjunct)  Date:  04/13/2015  Time:  9:16 PM  Type of Therapy:  Psychoeducational Skills  Participation Level:  Active  Participation Quality:  Appropriate, Attentive, Sharing and Supportive  Affect:  Appropriate  Cognitive:  Appropriate  Insight:  Appropriate  Engagement in Group:  Engaged  Modes of Intervention:  Discussion  Summary of Progress/Problems: Pt attended group this evening and was appropriate. Pt rated his day a 5/10 due to back pain and his pain medication not working well. Pt stated his goal for today was to get his medications stabilized which he was able to do. Pt stated the positive thing that happened today is his medications being stabilized.  Caswell CorwinOwen, Annelyse Rey C 04/13/2015, 9:16 PM

## 2015-04-13 NOTE — Progress Notes (Signed)
Adult Psychoeducational Group Note  Date:  04/13/2015 Time:  9:47 AM  Group Topic/Focus:  Recovery Goals:   The focus of this group is to identify appropriate goals for recovery and establish a plan to achieve them.  Participation Level:  Did not attend/  Participation Quality:  Pt did not attend.  Affect:  did not attend  Cognitive:  did not attend  Insight: None  Engagement in Group:  Did not attend.  Modes of Intervention:  n/a  Additional Comments:  Did not attend.  Genia DelBatchelor, Diane C 04/13/2015, 9:47 AM

## 2015-04-13 NOTE — Progress Notes (Signed)
NSG shift assessment. 7a-7p.   D: Affect blunted, mood depressed, behavior appropriate. Pt did not attend Nurse Education group, but went to bed to sleep.  Cooperative with staff and is getting along well with peers.   A: Observed pt interacting in group and in the milieu: Support and encouragement offered. Safety maintained with observations every 15 minutes.   R:   Contracts for safety and continues to follow the treatment plan, working on learning new coping skills.

## 2015-04-14 MED ORDER — DARUNAVIR-COBICISTAT 800-150 MG PO TABS: ORAL_TABLET | ORAL | Status: DC

## 2015-04-14 MED ORDER — NICOTINE 21 MG/24HR TD PT24: 21.0000 mg | MEDICATED_PATCH | Freq: Every day | TRANSDERMAL | Status: DC

## 2015-04-14 MED ORDER — NICOTINE POLACRILEX 2 MG MT GUM
2.0000 mg | CHEWING_GUM | OROMUCOSAL | Status: DC | PRN
  Administered 2015-04-14: 2 mg via ORAL

## 2015-04-14 MED ORDER — SULFAMETHOXAZOLE-TRIMETHOPRIM 800-160 MG PO TABS: 1.0000 | ORAL_TABLET | Freq: Every day | ORAL | Status: DC

## 2015-04-14 MED ORDER — DOCUSATE SODIUM 100 MG PO CAPS: 100.0000 mg | ORAL_CAPSULE | Freq: Two times a day (BID) | ORAL | Status: DC

## 2015-04-14 MED ORDER — DRONABINOL 5 MG PO CAPS: 5.0000 mg | ORAL_CAPSULE | Freq: Three times a day (TID) | ORAL | Status: DC

## 2015-04-14 MED ORDER — HYDROXYZINE HCL 25 MG PO TABS: 25.0000 mg | ORAL_TABLET | Freq: Four times a day (QID) | ORAL | Status: DC | PRN

## 2015-04-14 MED ORDER — QUETIAPINE FUMARATE 200 MG PO TABS: 200.0000 mg | ORAL_TABLET | Freq: Every day | ORAL | Status: DC

## 2015-04-14 MED ORDER — GABAPENTIN 300 MG PO CAPS: 300.0000 mg | ORAL_CAPSULE | Freq: Three times a day (TID) | ORAL | Status: DC

## 2015-04-14 MED ORDER — HYDROCORTISONE 1 % EX CREA: TOPICAL_CREAM | Freq: Four times a day (QID) | CUTANEOUS | Status: DC

## 2015-04-14 MED ORDER — HYDROCERIN EX CREA
TOPICAL_CREAM | CUTANEOUS | Status: DC | PRN
  Administered 2015-04-14: 17:00:00 via TOPICAL
  Filled 2015-04-14: qty 113

## 2015-04-14 MED ORDER — ABACAVIR-DOLUTEGRAVIR-LAMIVUD 600-50-300 MG PO TABS: 1.0000 | ORAL_TABLET | Freq: Every day | ORAL | Status: DC

## 2015-04-14 NOTE — [facility] Group Notes (Signed)
[facility]   04/14/2015 10:19 AM  Participation Quality:  Invited. DID NOT ATTEND. Pt chose to remain in bed.   Smart, American FinancialHeather LCSWA

## 2015-04-14 NOTE — Progress Notes (Signed)
  [facility]  Will you be returning to the same living situation after discharge:  No.Pt plans to turn himself in at d/c (active warrant).  At discharge, do you have transportation home?: Yes,  bus pass provided.  Do you have the ability to pay for your medications: Yes,  mental health  Release of information consent forms completed and submitted to medical records by CSW.  Patient to Follow up at: Follow-up Information    [facility]   Why:  Patient current in services w this provider. Please contact Jodie (counselor) after discharge from jail in order to schedule follow-up appt.    Contact information:   [address],  [place], [place] [address] Phone: [phone]       Follow up with [facility].   Why:  referral sent; 04/09/15. Not eligable at this time due to active warrant. Please contact Shayla in admissions after your have taken care of this if you are still interested in treatment at this faciltiy. Thank you.    Contact information:   [address] [place], [place] [address] Phone: [phone] Fax: [phone]      Follow up with [facility].   Why:  Referral sent 04/12/15 at 3:30PM. Please follow up after discharge from jail if you are interested in pursuing this treatment facility/halfway house. Your referral is being held.    Contact information:   [address] [place], [place] [address] Phone: [phone] Lawson Fiscal(Lori in admissions) Fax: [phone]      Patient denies SI/HI: Yes,  during group/self report.     Safety Planning and Suicide Prevention discussed: Yes,  SPE completed with pt, as he refused to consent to family contact. SPI pamphlet provided to pt and he was encouraged to share information with support network, ask questions, and talk about any concerns relating to SPE.  Have you used any form of tobacco in the last 30 days? (Cigarettes, Smokeless Tobacco, Cigars,  and/or Pipes): Yes  Has patient been referred to the Quitline?: Yes, faxed on 04/14/15  Smart, Heather LCSWA  04/14/2015, 10:27 AM

## 2015-04-14 NOTE — Progress Notes (Signed)
Patient ID: Kurt SolianReginald K Foley, male   DOB: 09-03-1975, 39 y.o.   MRN: [id number]   PPD read by writer and provider, Dominga FerryAgustin, NP. No induration noted, negative result. 04/14/2015 2:36 PM

## 2015-04-14 NOTE — Tx Team (Signed)
Interdisciplinary Treatment Plan Update (Adult)  Date:  04/14/2015  Time Reviewed:  10:19 AM   Progress in Treatment: Attending groups: Intermittently  Participating in groups:  Yes, when he attends  Taking medication as prescribed:  Yes. Tolerating medication:  Yes. Family/Significant othe contact made:  Pt refused to consent to family contact. SPE completd with pt.  Patient understands diagnosis:  Yes. and As evidenced by:  seeking treatment for SI, depression, ETOH abuse, and medication stabilization Discussing patient identified problems/goals with staff:  Yes. Medical problems stabilized or resolved:  Yes. Denies suicidal/homicidal ideation: Yes Issues/concerns per patient self-inventory:  Other:  Discharge Plan or Barriers: Pt is homeless. [facility] for med management [facility] for HIV med management-pt instructed to contact Jodie when he is released from jail to schedule follow-up. Pt referred to [facility] and plans to contact [facility] after release from jail to seek long term placement with them. Pt plans to turn himself in at d/c, as there is active warrant out for his arrest, making him ineligible for further inpatient treatment.   Reason for Continuation of Hospitalization: none Comments:  Kurt Foley is a 38 y.o. male who voluntarily presents to [facility] with SI thoughts and depression. Pt says there are times when he does not want to live and wishes someone would hurt himself. [facility] and they advised to go to the emerg dept for medical clearance and placement. Pt says he's been SI for approx 2-3 days and has no specific plan to harm himself, however yesterday he was contemplating walking in traffic--"I'm overwhelmed and my mind and body are tired of fighting, I would be better off if I'm gone". Pt says he doesn't care and stopped taking his HIV medications, 3 days ago. Pt says his thoughts are triggered by: (1) worsening problems due  to HIV status; (2) no family support; (3) homelessness; (4) financial issues and (5) drug related problems. Pt denies SI attempts. Pt denies HI/AVH. Pt is tearful during the interview. Pt admits that he drinks 3-4 40's daily and his last drink was today, he drank 2-24oz beers. He smokes at least $200 worth of crack, daily and his last use was today, he smoked $100 worth of crack. Pt smokes a "dime bag" of marijuana, weekly and his last use was 04/04/15. Pt has no current w/d sxs and denies issues with seizures/blackouts. Pt says he is self medicating to help him cope with the his feelings of being overwhelmed. 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 Patient verbalized understanding and agreed to treatment plan. CSW reviewed [facility] "Discharge Process and Patient Involvement" Form. Pt verbalized understanding of information provided and signed form.    Review of initial/current patient goals per problem list:  1. Goal(s): Patient will participate in aftercare plan  Met: Yes   Target date: at discharge  As evidenced by: Patient will participate within aftercare plan AEB aftercare provider and housing plan at discharge being identified.  10/27: CSW assessing for appropriate referrals.   11/2: Pt found out yesterday that he has warrant out for his arrest, making him ineligable for treatment at long term substance abuse facility. He plans to turn himself in at d/c.   2. Goal (s): Patient will exhibit decreased  depressive symptoms and suicidal ideations.  Met: Adequate for discharge.     Target date: at discharge  As evidenced by: Patient will utilize self rating of depression at 3 or below and demonstrate  decreased signs of depression or be deemed stable for discharge by MD.  10/27: Pt rates depression as high and endorses passive SI/able to contract for safety on the unit.   11/2: Pt rates depression as high due to having warrant out for his arrest. Pt affect appears normal due to his situation. He no longer endorses SI/HI/AVH.   4. Goal(s): Patient will demonstrate decreased signs of withdrawal due to substance abuse  Met:Yes.   Target date:at discharge   As evidenced by: Patient will produce a CIWA/COWS score of 0, have stable vitals signs, and no symptoms of withdrawal.  10/27: Pt reports mild withdrawal symptoms with CIWA score of 1 and high sitting BP.   11/2: Pt reports no withdrawals today with CIWA score of 0 and stable vitals.  Attendees: Patient:   04/14/2015 10:19 AM   Family:   04/14/2015 10:19 AM   Physician:  Dr. Carlton Adam, MD 04/14/2015 10:19 AM   Nursing:  De Burrs RN 04/14/2015 10:19 AM   Clinical Social Worker: Maxie Better, [place]  04/14/2015 10:19 AM   Clinical Social Worker: Erasmo Downer Drinkard LCSWA; Peri Maris LCSWA 04/14/2015 10:19 AM   Other:  Gerline Legacy Nurse Case Manager 04/14/2015 10:19 AM   Other:  Lucinda Dell; [facility]  04/14/2015 10:19 AM   Other:   04/14/2015 10:19 AM   Other:  04/14/2015 10:19 AM   Other:  04/14/2015 10:19 AM   Other:  04/14/2015 10:19 AM    04/14/2015 10:19 AM    04/14/2015 10:19 AM    04/14/2015 10:19 AM    04/14/2015 10:19 AM    Scribe for Treatment Team:   Maxie Better, [place]  04/14/2015 10:19 AM

## 2015-04-14 NOTE — Progress Notes (Signed)
Patient ID: Kurt Foley, male   DOB: [birth year], 39 y.o.   MRN: [id number]  Pt currently presents with a flat affect and sullen behavior. Pt main complaint is about back pain. Pt describes an on going squeezing sensation in his mid left back.  Per self inventory, pt rates depression at a 5, hopelessness 8 and anxiety 0. Pt's daily goal is to "creating something for my back pain" and they intend to do so by "see the doctor."  Pt provided with medications per providers orders. Pt's labs and vitals were monitored throughout the day. Pt supported emotionally and encouraged to express concerns and questions. Pt educated on medications. Pt encouraged to speak with Md about back pain, provider consulted by Clinical research associatewriter.   Pt's safety ensured with 15 minute and environmental checks. Pt currently denies SI/HI and A/V hallucinations. Pt verbally agrees to seek staff if SI/HI or A/VH occurs and to consult with staff before acting on these thoughts. Pt to be discharged tomorrow per MD. Will continue POC.

## 2015-04-14 NOTE — Progress Notes (Signed)
[facility] MD Progress Note  04/14/2015 5:45 PM Kurt Foley  MRN:  [id number] Subjective:  Kurt Foley has been having a harder time since he found out that there was a warrant out for his arrest. He is trying to work out the logistics of how get some money to go to the bonds man and have him go with him to the police. States this is causing a lot of anxiety, worry. Principal Problem: Severe recurrent major depression without psychotic features (HCC) Diagnosis:   Patient Active Problem List   Diagnosis Date Noted  . Cocaine dependence with cocaine-induced mood disorder (HCC) [F14.24]   . Cannabis use disorder, severe, dependence (HCC) [F12.20] 04/09/2015  . Severe recurrent major depression without psychotic features (HCC) [F33.2] 04/08/2015  . Cocaine dependence (HCC) [F14.20] 04/08/2015  . Alcohol abuse [F10.10] 04/08/2015  . Screening examination for venereal disease [Z11.3] 03/25/2015  . Encounter for long-term (current) use of medications [Z79.899] 03/25/2015  . 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 AIDS (HCC) [B20]   . Tobacco abuse [Z72.0]   . Decreased appetite [R63.0]   . 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 CAP (community acquired pneumonia) [J18.9] 03/20/2015  . Leukocytosis [D72.829] 03/20/2015  . Contusion of right knee [S80.01XA] 03/20/2015  . Fall [W19.XXXA] 03/20/2015  . Loss of weight [R63.4] 02/12/2014  . Visual disturbance [H53.9] 11/18/2013  . Human immunodeficiency virus (HIV) disease (HCC) [B20] 10/30/2013  . Atopic dermatitis [L20.9] 10/30/2013   Total Time spent with patient: 30 minutes  Past Psychiatric History: admission H and P  Past Medical History:  Past Medical History  Diagnosis Date  . HIV infection (HCC)   . Depression    History reviewed. No pertinent past surgical history. Family History:  Family History  Problem Relation Age of Onset  . Diabetes Mother   . Hypertension Mother    . Arthritis Mother    Family Psychiatric  History: see admission H and P Social History:  History  Alcohol Use  . 1.2 oz/week  . 2 Standard drinks or equivalent per week    Comment: 3-4 days a week.Last drink: today     History  Drug Use  . Yes  . Special: Marijuana, Cocaine    Comment: Once a week. Last used yesterday.  Cocaine last used today.     Social History   Social History  . Marital Status: Married    Spouse Name: N/A  . Number of Children: N/A  . Years of Education: N/A   Social History Main Topics  . Smoking status: Current Every Day Smoker -- 0.50 packs/day for 25 years    Types: Cigarettes  . Smokeless tobacco: Never Used     Comment: cutting back  . Alcohol Use: 1.2 oz/week    2 Standard drinks or equivalent per week     Comment: 3-4 days a week.Last drink: today  . Drug Use: Yes    Special: Marijuana, Cocaine     Comment: Once a week. Last used yesterday.  Cocaine last used today.   Marland Kitchen Sexual Activity: Not Asked     Comment: encouraged condom use   Other Topics Concern  . None   Social History Narrative   Additional Social History:                         Sleep: Fair  Appetite:  Fair  Current Medications: Current Facility-Administered Medications  Medication  Dose Route Frequency Provider Last Rate Last Dose  . Abacavir-Dolutegravir-Lamivud 600-50-300 MG TABS 1 tablet  1 tablet Oral Daily Rachael FeeIrving A Hulon Ferron, MD   1 tablet at 04/14/15 0825  . acetaminophen (TYLENOL) tablet 650 mg  650 mg Oral Q6H PRN Adonis BrookSheila Agustin, NP   650 mg at 04/14/15 0106  . alum & mag hydroxide-simeth (MAALOX/MYLANTA) 200-200-20 MG/5ML suspension 30 mL  30 mL Oral Q4H PRN Adonis BrookSheila Agustin, NP      . darunavir-cobicistat (PREZCOBIX) 800-150 MG per tablet 1 tablet  1 tablet Oral Q breakfast Rachael FeeIrving A Shunsuke Granzow, MD   1 tablet at 04/14/15 0825  . docusate sodium (COLACE) capsule 100 mg  100 mg Oral BID Rachael FeeIrving A Orley Lawry, MD   100 mg at 04/14/15 1701  . dronabinol (MARINOL) capsule 5  mg  5 mg Oral TID AC Rachael FeeIrving A Nikolus Marczak, MD   5 mg at 04/14/15 1701  . feeding supplement (ENSURE ENLIVE) (ENSURE ENLIVE) liquid 237 mL  237 mL Oral BID BM Rachael FeeIrving A Urania Pearlman, MD   237 mL at 04/13/15 1855  . gabapentin (NEURONTIN) capsule 300 mg  300 mg Oral TID Rachael FeeIrving A Dallis Darden, MD   300 mg at 04/14/15 1701  . gabapentin (NEURONTIN) capsule 300 mg  300 mg Oral QHS Rachael FeeIrving A Derian Dimalanta, MD   300 mg at 04/13/15 2125  . hydrocerin (EUCERIN) cream   Topical PRN Adonis BrookSheila Agustin, NP      . hydrocortisone cream 1 %   Topical QID Rachael FeeIrving A Kaelin Bonelli, MD      . hydrOXYzine (ATARAX/VISTARIL) tablet 25 mg  25 mg Oral QID PRN Shuvon B Rankin, NP   25 mg at 04/12/15 0854  . magnesium hydroxide (MILK OF MAGNESIA) suspension 30 mL  30 mL Oral Daily PRN Adonis BrookSheila Agustin, NP      . nicotine polacrilex (NICORETTE) gum 2 mg  2 mg Oral PRN Rachael FeeIrving A Ravinder Lukehart, MD      . QUEtiapine (SEROQUEL) tablet 200 mg  200 mg Oral QHS Rachael FeeIrving A Candi Profit, MD   200 mg at 04/13/15 2124  . sulfamethoxazole-trimethoprim (BACTRIM DS,SEPTRA DS) 800-160 MG per tablet 1 tablet  1 tablet Oral Daily Adonis BrookSheila Agustin, NP   1 tablet at 04/14/15 0825  . tuberculin injection 5 Units  5 Units Intradermal Once Rachael FeeIrving A Jidenna Figgs, MD   5 Units at 04/12/15 2013    Lab Results: No results found for this or any previous visit (from the past 48 hour(s)).  Physical Findings: AIMS: Facial and Oral Movements Muscles of Facial Expression: None, normal Lips and Perioral Area: None, normal Jaw: None, normal Tongue: None, normal,Extremity Movements Upper (arms, wrists, hands, fingers): None, normal Lower (legs, knees, ankles, toes): None, normal, Trunk Movements Neck, shoulders, hips: None, normal, Overall Severity Severity of abnormal movements (highest score from questions above): None, normal Incapacitation due to abnormal movements: None, normal Patient's awareness of abnormal movements (rate only patient's report): No Awareness, Dental Status Current problems with teeth and/or dentures?:  No Does patient usually wear dentures?: Yes  CIWA:  CIWA-Ar Total: 1 COWS:     Musculoskeletal: Strength & Muscle Tone: within normal limits Gait & Station: normal Patient leans: normal  Psychiatric Specialty Exam: Review of Systems  Constitutional: Negative.   HENT: Negative.   Eyes: Negative.   Respiratory: Negative.   Cardiovascular: Negative.   Gastrointestinal: Negative.   Genitourinary: Negative.   Musculoskeletal: Negative.   Skin: Negative.   Neurological: Negative.   Endo/Heme/Allergies: Negative.   Psychiatric/Behavioral: Positive for substance  abuse. The patient is nervous/anxious.     Blood pressure 119/87, pulse 104, temperature 97.6 F (36.4 C), temperature source Oral, resp. rate 16, height 5' 4.5" (1.638 m), weight 53.524 kg (118 lb), SpO2 100 %.Body mass index is 19.95 kg/(m^2).  General Appearance: Fairly Groomed  Patent attorney::  Fair  Speech:  Clear and Coherent  Volume:  Decreased  Mood:  Anxious and worried  Affect:  anxious worried  Thought Process:  Coherent and Goal Directed  Orientation:  Full (Time, Place, and Person)  Thought Content:  symptoms events worries concerns  Suicidal Thoughts:  No  Homicidal Thoughts:  No  Memory:  Immediate;   Fair Recent;   Fair Remote;   Fair  Judgement:  Fair  Insight:  Present and Shallow  Psychomotor Activity:  Decreased  Concentration:  Fair  Recall:  [employer] of Knowledge:Fair  Language: Fair  Akathisia:  No  Handed:  Right  AIMS (if indicated):     Assets:  Desire for Improvement  ADL's:  Intact  Cognition: WNL  Sleep:  Number of Hours: 5.75   Treatment Plan Summary: Daily contact with patient to assess and evaluate symptoms and progress in treatment and Medication management Supportive approach/coping skills Polysubstance dependence; continue to work a relapse prevention plan Mood instability; continue to work with the Seroquel 200 mg Anxiety; continue to work with the Neurontin 300  mg Encourage to call his grandmother who could help him with some money for the "bond man" Continue to explore residential treatment options after he takes care of his legal issues Nickayla Mcinnis A 04/14/2015, 5:45 PM

## 2015-04-14 NOTE — Progress Notes (Signed)
Pt attended the evening NA speaker meeting. Pt was engaged and attentive. 

## 2015-04-15 MED ORDER — HYDROXYZINE HCL 25 MG PO TABS: 25.0000 mg | ORAL_TABLET | Freq: Four times a day (QID) | ORAL | Status: DC | PRN

## 2015-04-15 MED ORDER — GABAPENTIN 300 MG PO CAPS: 300.0000 mg | ORAL_CAPSULE | Freq: Three times a day (TID) | ORAL | Status: DC

## 2015-04-15 MED ORDER — DOCUSATE SODIUM 100 MG PO CAPS: 100.0000 mg | ORAL_CAPSULE | Freq: Two times a day (BID) | ORAL | Status: DC

## 2015-04-15 MED ORDER — HYDROCORTISONE 1 % EX CREA: TOPICAL_CREAM | Freq: Four times a day (QID) | CUTANEOUS | Status: DC

## 2015-04-15 MED ORDER — QUETIAPINE FUMARATE 200 MG PO TABS: 200.0000 mg | ORAL_TABLET | Freq: Every day | ORAL | Status: DC

## 2015-04-15 MED ORDER — NICOTINE 21 MG/24HR TD PT24: 21.0000 mg | MEDICATED_PATCH | Freq: Every day | TRANSDERMAL | Status: DC

## 2015-04-15 NOTE — Plan of Care (Signed)
Problem: Diagnosis: Increased Risk For Suicide Attempt Goal: LTG-Patient Will Report Improved Mood and Deny Suicidal LTG (by discharge) Patient will report improved mood and deny suicidal ideation.  Outcome: Progressing Patient denies suicidal ideation today, however, reports no improvement in mood today.

## 2015-04-15 NOTE — Plan of Care (Signed)
Problem: Diagnosis: Increased Risk For Suicide Attempt Goal: LTG-Patient Will Report Absence of Withdrawal Symptoms LTG (by discharge): Patient will report absence of withdrawal symptoms.  Outcome: Completed/Met Date Met:  04/15/15 Pt reports he is no longer having any withdrawal symptoms.

## 2015-04-15 NOTE — Progress Notes (Signed)
Pt reports his day has been ok. He attended evening group.  He denies SI/HI/AVH.  He makes his needs known to staff.  He did not mention his legal situation with Clinical research associatewriter, so we did not discuss his plans related to resolving that issue in his life.  His discharge plans are still in process.  Support and encouragement offered.  He is still c/o back pain and receiving Tylenol.  He declined a heat pack.  Pt is pleasant and cooperative.  Safety maintained with q15 minute checks.

## 2015-04-15 NOTE — Progress Notes (Signed)
Patient verbalizes readiness for discharge. Follow up plan explained, Rx's given along with sample meds. All belongings returned. Patient verbalizes understanding. Denies SI/HI and assures this Clinical research associatewriter he will seek assistance should that status change. Patient discharged ambulatory and in stable condition with bus passes.

## 2015-04-15 NOTE — Progress Notes (Signed)
D:  Patient was alert and oriented on the unit this shift.  Patient appeared mildly anxious and reported that his mood was not improved from yesterday.  Patient remained in his bed after meals stating "my back is killing me."  Patient did not attend group this am.  Patient reported his depressed was rated a "5" today and his anxiety an "8"  Patient reported he slept well with sleep medication.  He reported his energy level was low and his concentration was good.  Patient reported that he wanted to work on "finding somewhere to stay" by "networking"   A:  Scheduled medications were administered to patient per MD orders.  Emotional support and encouragement were provided.  Patient was maintained on q.15 minute safety checks.  Patient was informed to notify staff with any questions or concerns.   R:  No adverse medication reactions were noted.  Patient was cooperative with medication administration and treatment plan this shift.  Patient is receptive and cooperative on the unit at this time.  Patient interacts minimally with others on the unit at this time.  Patient contracts for safety at this time.  Patient remains safe at this time.

## 2015-04-15 NOTE — BHH Suicide Risk Assessment (Signed)
[facility] Discharge Suicide Risk Assessment   Demographic Factors:  Male  Total Time spent with patient: 30 minutes  Musculoskeletal: Strength & Muscle Tone: within normal limits Gait & Station: normal Patient leans: normal  Psychiatric Specialty Exam: Physical Exam  Review of Systems  Constitutional: Negative.   HENT: Negative.   Eyes: Negative.   Respiratory: Negative.   Cardiovascular: Negative.   Gastrointestinal: Negative.   Genitourinary: Negative.   Musculoskeletal: Negative.   Skin: Negative.   Neurological: Negative.   Endo/Heme/Allergies: Negative.   Psychiatric/Behavioral: Positive for substance abuse.    Blood pressure 106/86, pulse 97, temperature 97.4 F (36.3 C), temperature source Oral, resp. rate 18, height 5' 4.5" (1.638 m), weight 53.524 kg (118 lb), SpO2 100 %.Body mass index is 19.95 kg/(m^2).  General Appearance: Fairly Groomed  Patent attorney::  Fair  Speech:  Clear and Coherent409  Volume:  Decreased  Mood:  Anxious and worried  Affect:  Restricted  Thought Process:  Coherent and Goal Directed  Orientation:  Full (Time, Place, and Person)  Thought Content:  plans as he moves on, relapse prevention plan  Suicidal Thoughts:  No  Homicidal Thoughts:  No  Memory:  Immediate;   Fair Recent;   Fair Remote;   Fair  Judgement:  Fair  Insight:  Present  Psychomotor Activity:  Normal  Concentration:  Fair  Recall:  [employer] of Knowledge:Fair  Language: Fair  Akathisia:  No  Handed:  Right  AIMS (if indicated):     Assets:  Desire for Improvement  Sleep:  Number of Hours: 5.25  Cognition: WNL  ADL's:  Intact   Have you used any form of tobacco in the last 30 days? (Cigarettes, Smokeless Tobacco, Cigars, and/or Pipes): Yes  Has this patient used any form of tobacco in the last 30 days? (Cigarettes, Smokeless Tobacco, Cigars, and/or Pipes) Yes, A prescription for an FDA-approved tobacco cessation medication was offered at discharge and the patient  refused  Mental Status Per Nursing Assessment::   On Admission:  Suicidal ideation indicated by patient  Current Mental Status by Physician: In full contact with reality. There are no active S/S of withdrawal. There are no active SI plans or intent. He is going to go downtown and present himself to the bond's man and see what they can do for him. He still wants to pursue rehab once all this is clarified   Loss Factors: Legal issues and Financial problems/change in socioeconomic status  Historical Factors: NA  Risk Reduction Factors:   wants to do better  Continued Clinical Symptoms:  Depression:   Comorbid alcohol abuse/dependence Alcohol/Substance Abuse/Dependencies  Cognitive Features That Contribute To Risk:  Closed-mindedness, Polarized thinking and Thought constriction (tunnel vision)    Suicide Risk:  Minimal: No identifiable suicidal ideation.  Patients presenting with no risk factors but with morbid ruminations; may be classified as minimal risk based on the severity of the depressive symptoms  Principal Problem: Severe recurrent major depression without psychotic features [facility]) Discharge Diagnoses:  Patient Active Problem List   Diagnosis Date Noted  . Cocaine dependence with cocaine-induced mood disorder (HCC) [F14.24]   . Cannabis use disorder, severe, dependence (HCC) [F12.20] 04/09/2015  . Severe recurrent major depression without psychotic features (HCC) [F33.2] 04/08/2015  . Cocaine dependence (HCC) [F14.20] 04/08/2015  . Alcohol abuse [F10.10] 04/08/2015  . Screening examination for venereal disease [Z11.3] 03/25/2015  . Encounter for long-term (current) use of medications [Z79.899] 03/25/2015  . 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 AIDS (HCC) [B20]   . Tobacco abuse [Z72.0]   . Decreased appetite [R63.0]   . 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 CAP (community acquired pneumonia) [J18.9]  03/20/2015  . Leukocytosis [D72.829] 03/20/2015  . Contusion of right knee [S80.01XA] 03/20/2015  . Fall [W19.XXXA] 03/20/2015  . Loss of weight [R63.4] 02/12/2014  . Visual disturbance [H53.9] 11/18/2013  . Human immunodeficiency virus (HIV) disease (HCC) [B20] 10/30/2013  . Atopic dermatitis [L20.9] 10/30/2013    Follow-up Information    [facility].   Why:  Patient current in services w this provider. Please contact Jodie (counselor) after discharge from jail in order to schedule follow-up appt.    Contact information:   [address],  [place], [place] [address] Phone: [phone]       Follow up with [facility].   Why:  referral sent; 04/09/15. Not eligable at this time due to active warrant. Please contact Shayla in admissions after your have taken care of this if you are still interested in treatment at this faciltiy. Thank you.    Contact information:   [address] [place], [place] [address] Phone: [phone] Fax: [phone]      Follow [facility] with [facility].   Why:  Referral sent 04/12/15 at 3:30PM. Please follow up after discharge from jail if you are interested in pursuing this treatment facility/halfway house. Your referral is being held.    Contact information:   [address] [place], [place] [address] Phone: [phone] Lawson Fiscal(Lori in admissions) Fax: [phone]      Plan Of Care/Follow-up recommendations:  Activity:  as tolerated Diet:  regular Follow up as above Is patient on multiple antipsychotic therapies at discharge:  No   Has Patient had three or more failed trials of antipsychotic monotherapy by history:  No  Recommended Plan for Multiple Antipsychotic Therapies: NA    Jobin Montelongo A 04/15/2015, 12:00 PM

## 2015-04-15 NOTE — Plan of Care (Signed)
Problem: Diagnosis: Increased Risk For Suicide Attempt Goal: STG-Patient Will Report Suicidal Feelings to Staff Outcome: Progressing Patient contracts for safety on the unit today.

## 2015-04-15 NOTE — BHH Group Notes (Signed)
Child/Adolescent Psychoeducational Group Note  Date:  04/15/2015 Time:  0900   Group Topic/Focus:  Self Care:   The focus of this group is to help patients understand the importance of self-care in order to improve or restore emotional, physical, spiritual, interpersonal, and financial health.  Participation Level:  Did Not Attend  Participation Quality:  Did not attend  Affect:  Did not attend  Cognitive:  Did not attend  Insight:  None  Engagement in Group:  Did not attend  Modes of Intervention:  Did not attend  Additional Comments:  Patient did not attend group.  He was sleeping.  Kurt SalisburyJennifer L Shalon Foley 04/15/2015, 10:03 AM

## 2015-04-15 NOTE — Discharge Summary (Signed)
Physician Discharge Summary Note  Patient:  Kurt Foley is an 39 y.o., male MRN:  [id number] DOB:  1975-11-12 Patient phone:  [phone] (home)  Patient address:   [address] [place] [address],  Total Time spent with patient: 30 minutes  Date of Admission:  04/07/2015 Date of Discharge: 04/15/2015  Reason for Admission: PER HPI-38 Y/o male who states that he is all alone, has no family support. States everything that has happened in his life keeps coming back. States he has been fighting this HIV for over 20 years. States he was in prison for 3 years for assault with a deadly weapon. His father died while he was in prison. States he has been here and there for the last 2 weeks. Was staying with his mother until then but they had a fall out. Using cocaine crack over 20 years on and off. Alcohol 2-3 beers a day 3-4 days a week. States that when he was diagnosed HIV this all start happening. States he did not expect it and his whole world came down. He also smokes pot 2-3 times a week. Has been increasingly more depressed. Upset about his low CD 4 count. States he uses in order to deal with the way he feels what makes things worst for him   Kurt Foley is a 39 y.o. male who voluntarily presents to [facility] with SI thoughts and depression. Pt says there are times when he does not want to live and wishes someone would hurt himself. Pt initially presented to [facility] and they advised to go to the emerg dept for medical clearance and placement. Pt says he's been SI for approx 2-3 days and has no specific plan to harm himself, however yesterday he was contemplating walking in traffic--"I'm overwhelmed and my mind and body are tired of fighting, I would be better off if I'm gone". Pt says he doesn't care and stopped taking his HIV medications, 3 days ago. Pt says his thoughts are triggered by: (1) worsening problems due to HIV status; (2) no family support; (3) homelessness; (4)  financial issues and (5) drug related problems. Pt denies SI attempts. Pt denies HI/AVH. Pt is tearful during the interview. Pt admits that he drinks 3-4 40's daily and his last drink was today, he drank 2-24oz beers. He smokes at least $200 worth of crack, daily and his last use was today, he smoked $100 worth of crack. Pt smokes a "dime bag" of marijuana, weekly and his last use was 04/04/15. Pt has no current w/d sxs and denies issues with seizures/blackouts. Pt says he is self medicating to help him cope with the his feelings of being overwhelmed  Principal Problem: Severe recurrent major depression without psychotic features [facility]) Discharge Diagnoses: Patient Active Problem List   Diagnosis Date Noted  . Cocaine dependence with cocaine-induced mood disorder (HCC) [F14.24]   . Cannabis use disorder, severe, dependence (HCC) [F12.20] 04/09/2015  . Severe recurrent major depression without psychotic features (HCC) [F33.2] 04/08/2015  . Cocaine dependence (HCC) [F14.20] 04/08/2015  . Alcohol abuse [F10.10] 04/08/2015  . Screening examination for venereal disease [Z11.3] 03/25/2015  . Encounter for long-term (current) use of medications [Z79.899] 03/25/2015  . 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 AIDS (HCC) [B20]   . Tobacco abuse [Z72.0]   . Decreased appetite [R63.0]   . 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 CAP (community acquired pneumonia) [J18.9] 03/20/2015  . Leukocytosis [D72.829]  03/20/2015  . Contusion of right knee [S80.01XA] 03/20/2015  . Fall [W19.XXXA] 03/20/2015  . Loss of weight [R63.4] 02/12/2014  . Visual disturbance [H53.9] 11/18/2013  . Human immunodeficiency virus (HIV) disease (HCC) [B20] 10/30/2013  . Atopic dermatitis [L20.9] 10/30/2013    Musculoskeletal: Strength & Muscle Tone: within normal limits Gait & Station: normal Patient leans: N/A  Psychiatric Specialty Exam: SEE SRA Physical  Exam  Nursing note and vitals reviewed. Constitutional: He appears well-developed.  HENT:  Head: Atraumatic.  Neck: Normal range of motion.  Cardiovascular: Regular rhythm.   Respiratory: Breath sounds normal.  Musculoskeletal: Normal range of motion.  Skin: Skin is warm and dry.  Psychiatric: He has a normal mood and affect.    Review of Systems  Psychiatric/Behavioral: Negative for suicidal ideas. Depression: stable. Substance abuse: stable. Nervous/anxious: stable.   All other systems reviewed and are negative.   Blood pressure 106/86, pulse 97, temperature 97.4 F (36.3 C), temperature source Oral, resp. rate 18, height 5' 4.5" (1.638 m), weight 53.524 kg (118 lb), SpO2 100 %.Body mass index is 19.95 kg/(m^2).  Have you used any form of tobacco in the last 30 days? (Cigarettes, Smokeless Tobacco, Cigars, and/or Pipes): Yes  Has this patient used any form of tobacco in the last 30 days? (Cigarettes, Smokeless Tobacco, Cigars, and/or Pipes) Yes, A prescription for an FDA-approved tobacco cessation medication was offered at discharge and the patient refused  Past Medical History:  Past Medical History  Diagnosis Date  . HIV infection (HCC)   . Depression    History reviewed. No pertinent past surgical history. Family History:  Family History  Problem Relation Age of Onset  . Diabetes Mother   . Hypertension Mother   . Arthritis Mother    Social History:  History  Alcohol Use  . 1.2 oz/week  . 2 Standard drinks or equivalent per week    Comment: 3-4 days a week.Last drink: today     History  Drug Use  . Yes  . Special: Marijuana, Cocaine    Comment: Once a week. Last used yesterday.  Cocaine last used today.     Social History   Social History  . Marital Status: Married    Spouse Name: N/A  . Number of Children: N/A  . Years of Education: N/A   Social History Main Topics  . Smoking status: Current Every Day Smoker -- 0.50 packs/day for 25 years    Types:  Cigarettes  . Smokeless tobacco: Never Used     Comment: cutting back  . Alcohol Use: 1.2 oz/week    2 Standard drinks or equivalent per week     Comment: 3-4 days a week.Last drink: today  . Drug Use: Yes    Special: Marijuana, Cocaine     Comment: Once a week. Last used yesterday.  Cocaine last used today.   Marland Kitchen. Sexual Activity: Not Asked     Comment: encouraged condom use   Other Topics Concern  . None   Social History Narrative    Risk to Self: Is patient at risk for suicide?: Yes What has been your use of drugs/alcohol within the last 12 months?: uses $100 - 200 crack cocaine every two days, drinks beer 3 days/week, occasional use of marijuana.  Risk to Others:  no Prior Inpatient Therapy:  yes  Prior Outpatient Therapy:  yes  Level of Care:  OP  Hospital Course: Aquilla SolianReginald K Bosket was admitted for Severe recurrent major depression without psychotic features (HCC) and  crisis management. He was treated discharged with the medications listed below under Medication List.  Medical problems were identified and treated as needed.  Home medications were restarted as appropriate.  Improvement was monitored by observation and Sydnee Levans Flagler daily report of symptom reduction.  Emotional and mental status was monitored by daily self-inventory reports completed by [name] and clinical staff.         Aditya Nastasi Dusek was evaluated by the treatment team for stability and plans for continued recovery upon discharge.  Kamari Buch Garverick motivation was an integral factor for scheduling further treatment.  Employment, transportation, bed availability, health status, family support, and any pending legal issues were also considered during his hospital stay.  He was offered further treatment options upon discharge including but not limited to Residential, Intensive Outpatient, and Outpatient treatment.  Alistair Senft Stineman will follow up with the services as listed below under Follow Up  Information.     Upon completion of this admission the patient was both mentally and medically stable for discharge denying suicidal/homicidal ideation, auditory/visual/tactile hallucinations, delusional thoughts and paranoia.      Consults:  psychiatry  Significant Diagnostic Studies:  labs: UDS positive for Cocaine, CMP; ALT 15, CBC: 3.95 hemoglobin 12.8,  Discharge Vitals:   Blood pressure 106/86, pulse 97, temperature 97.4 F (36.3 C), temperature source Oral, resp. rate 18, height 5' 4.5" (1.638 m), weight 53.524 kg (118 lb), SpO2 100 %. Body mass index is 19.95 kg/(m^2). Lab Results:   No results found for this or any previous visit (from the past 72 hour(s)).  Physical Findings: AIMS: Facial and Oral Movements Muscles of Facial Expression: None, normal Lips and Perioral Area: None, normal Jaw: None, normal Tongue: None, normal,Extremity Movements Upper (arms, wrists, hands, fingers): None, normal Lower (legs, knees, ankles, toes): None, normal, Trunk Movements Neck, shoulders, hips: None, normal, Overall Severity Severity of abnormal movements (highest score from questions above): None, normal Incapacitation due to abnormal movements: None, normal Patient's awareness of abnormal movements (rate only patient's report): No Awareness, Dental Status Current problems with teeth and/or dentures?: No Does patient usually wear dentures?: Yes  CIWA:  CIWA-Ar Total: 1 COWS:      See Psychiatric Specialty Exam and Suicide Risk Assessment completed by Attending Physician prior to discharge.  Discharge destination:  Home  Is patient on multiple antipsychotic therapies at discharge:  No   Has Patient had three or more failed trials of antipsychotic monotherapy by history:  No  Recommended Plan for Multiple Antipsychotic Therapies: NA     Medication List    STOP taking these medications        hydrocortisone 2.5 % rectal cream  Commonly known as:  ANUSOL-HC     ibuprofen  800 MG tablet  Commonly known as:  ADVIL,MOTRIN     triamcinolone ointment 0.5 %  Commonly known as:  KENALOG      TAKE these medications      Indication   Abacavir-Dolutegravir-Lamivud 600-50-300 MG Tabs  Commonly known as:  TRIUMEQ  Take 1 tablet by mouth daily.   Indication:  HIV Disease     darunavir-cobicistat 800-150 MG tablet  Commonly known as:  PREZCOBIX  TAKE 1 TABLET BY MOUTH DAILY. SWALLOW WHOLE. DO NOT CRUSH, BREAK OR CHEW TABLETS. TAKE WITH FOOD   Indication:  HIV Disease     docusate sodium 100 MG capsule  Commonly known as:  COLACE  Take 1 capsule (100 mg total) by mouth 2 (two) times  daily.   Indication:  Constipation     dronabinol 5 MG capsule  Commonly known as:  MARINOL  Take 1 capsule (5 mg total) by mouth 3 (three) times daily. Managed by outpatient provider   Indication:  chronic pain, managed by outpatient provider     gabapentin 300 MG capsule  Commonly known as:  NEURONTIN  Take 1 capsule (300 mg total) by mouth 4 (four) times daily -  before meals and at bedtime.   Indication:  mood stabilization     hydrocortisone cream 1 %  Apply topically 4 (four) times daily.   Indication:  hemorrhoids     hydrOXYzine 25 MG tablet  Commonly known as:  ATARAX/VISTARIL  Take 1 tablet (25 mg total) by mouth 4 (four) times daily as needed for anxiety (Sleep).   Indication:  Anxiety Neurosis     nicotine 21 mg/24hr patch  Commonly known as:  NICODERM CQ - dosed in mg/24 hours  Place 1 patch (21 mg total) onto the skin daily.   Indication:  Nicotine Addiction     QUEtiapine 200 MG tablet  Commonly known as:  SEROQUEL  Take 1 tablet (200 mg total) by mouth at bedtime.   Indication:  mood stabilization     sulfamethoxazole-trimethoprim 800-160 MG tablet  Commonly known as:  BACTRIM DS,SEPTRA DS  Take 1 tablet by mouth daily.   Indication:  chronic infection           Follow-up Information    Go to [facility] for Infectious Disease.   Why:   Patient current in services w this provider. Please contact Jodie (counselor) after discharge from jail in order to schedule follow-up appt.    Contact information:   [address]  [place], [place] [address] Phone: [phone]       Follow up with [facility].   Why:  referral sent; 04/09/15. Not eligable at this time due to active warrant. Please contact Shayla in admissions after your have taken care of this if you are still interested in treatment at this faciltiy. Thank you.    Contact information:   [address] [place], [place] [address] Phone: [phone] Fax: [phone]      Follow up with [facility].   Why:  Referral sent 04/12/15 at 3:30PM. Please follow up after discharge from jail if you are interested in pursuing this treatment facility/halfway house. Your referral is being held.    Contact information:   [address] [place], [place] [address] Phone: [phone] Lawson Fiscal in admissions) Fax: [phone]      Follow-up recommendations:  Activity:  as tolerated Diet:  heart healthy  Comments:  **Take all of you medications as prescribed by your mental healthcare provider.  Report any adverse effects and reactions from your medications to your outpatient provider promptly. Do not engage in alcohol and or illegal drug use while on prescription medicines. In the event of worsening symptoms call the crisis hotline, 911, and or go to the nearest emergency department for appropriate evaluation and treatment of symptoms. Follow-up with your primary care provider for your medical issues, concerns and or health care needs.   Keep all scheduled appointments.  If you are unable to keep an appointment call to reschedule.  Let the nurse know if you will need medications before next scheduled appointment.*  Total Discharge Time: 30 Minitues  Signed: Oneta Rack FNP- [facility] 04/15/2015, 9:26 AM  I personally assessed the patient and formulated the  plan Geralyn Flash A. Sabra Heck, M.D.

## 2015-04-16 ENCOUNTER — Other Ambulatory Visit: Payer: Self-pay | Admitting: Internal Medicine

## 2015-04-16 ENCOUNTER — Telehealth: Payer: Self-pay | Admitting: *Deleted

## 2015-04-16 DIAGNOSIS — B20 Human immunodeficiency virus [HIV] disease: Secondary | ICD-10-CM

## 2015-04-16 NOTE — Telephone Encounter (Signed)
Referral made to [facility] Wendall MolaJacqueline Cockerham

## 2015-04-16 NOTE — Telephone Encounter (Signed)
-----   Message from Gardiner Barefootobert W Comer, MD sent at 04/15/2015  5:36 PM EDT ----- [facility]  I am not sure if he has any case manager but might be good for Amb[facility].  thanks

## 2015-04-17 ENCOUNTER — Other Ambulatory Visit: Payer: Self-pay | Admitting: Internal Medicine

## 2015-04-20 ENCOUNTER — Telehealth: Payer: Self-pay | Admitting: *Deleted

## 2015-04-20 NOTE — Telephone Encounter (Signed)
RN received the referral and reviewed the patient's chart prior to calling the patient. Per the chart the patient has stopped taking his HIV medications, continues to smoke crack and marijuana along with being suicidial. The patient was admitted to behavioral health and shared on 11/01 that he is scared to turn himself in for a outstanding warrant. Pt did in fact contact the Sheriff's office but I am not sure of the results. Pt may be incarcerated at this time. RN contacted the number listed for the patient. Voice message left stating Dr Luciana Axeomer and I would like to f/u with him to see how he is doing and offer some additional support to him. I left me number for the patient to return my call.

## 2015-07-11 ENCOUNTER — Other Ambulatory Visit: Payer: Self-pay | Admitting: Internal Medicine

## 2015-07-29 ENCOUNTER — Telehealth: Payer: Self-pay | Admitting: *Deleted

## 2015-07-29 ENCOUNTER — Other Ambulatory Visit (INDEPENDENT_AMBULATORY_CARE_PROVIDER_SITE_OTHER): Payer: Self-pay

## 2015-07-29 ENCOUNTER — Ambulatory Visit: Payer: Self-pay | Admitting: Internal Medicine

## 2015-07-29 DIAGNOSIS — Z113 Encounter for screening for infections with a predominantly sexual mode of transmission: Secondary | ICD-10-CM

## 2015-07-29 DIAGNOSIS — B20 Human immunodeficiency virus [HIV] disease: Secondary | ICD-10-CM

## 2015-07-29 DIAGNOSIS — Z79899 Other long term (current) drug therapy: Secondary | ICD-10-CM

## 2015-07-29 LAB — COMPLETE METABOLIC PANEL WITH GFR
ALT: 11 U/L (ref 9–46)
AST: 21 U/L (ref 10–40)
Albumin: 3.9 g/dL (ref 3.6–5.1)
Alkaline Phosphatase: 93 U/L (ref 40–115)
BUN: 18 mg/dL (ref 7–25)
CALCIUM: 9.1 mg/dL (ref 8.6–10.3)
CHLORIDE: 106 mmol/L (ref 98–110)
CO2: 24 mmol/L (ref 20–31)
CREATININE: 1.06 mg/dL (ref 0.60–1.35)
GFR, Est African American: >89 mL/min (ref >=60)
GFR, Est Non African American: 87 mL/min (ref >=60)
Glucose, Bld: 68 mg/dL (ref 65–99)
Potassium: 4.5 mmol/L (ref 3.5–5.3)
Sodium: 139 mmol/L (ref 135–146)
Total Bilirubin: 0.5 mg/dL (ref 0.2–1.2)
Total Protein: 6.9 g/dL (ref 6.1–8.1)

## 2015-07-29 LAB — LIPID PANEL
Cholesterol: 200 mg/dL (ref 125–200)
HDL: 48 mg/dL (ref >=40)
LDL CALC: 118 mg/dL (ref <130)
Total CHOL/HDL Ratio: 4.2 Ratio (ref <=5.0)
Triglycerides: 169 mg/dL — ABNORMAL HIGH (ref <150)
VLDL: 34 mg/dL — AB (ref <30)

## 2015-07-29 LAB — CBC WITH DIFFERENTIAL/PLATELET
Basophils Absolute: 0 10*3/uL (ref 0.0–0.1)
Basophils Relative: 0 % (ref 0–1)
EOS PCT: 3 % (ref 0–5)
Eosinophils Absolute: 0.3 10*3/uL (ref 0.0–0.7)
HEMATOCRIT: 40.4 % (ref 39.0–52.0)
Hemoglobin: 13.5 g/dL (ref 13.0–17.0)
LYMPHS ABS: 1.2 10*3/uL (ref 0.7–4.0)
LYMPHS PCT: 12 % (ref 12–46)
MCH: 31.7 pg (ref 26.0–34.0)
MCHC: 33.4 g/dL (ref 30.0–36.0)
MCV: 94.8 fL (ref 78.0–100.0)
MPV: 9.4 fL (ref 8.6–12.4)
Monocytes Absolute: 0.6 10*3/uL (ref 0.1–1.0)
Monocytes Relative: 6 % (ref 3–12)
Neutro Abs: 7.6 10*3/uL (ref 1.7–7.7)
Neutrophils Relative %: 79 % — ABNORMAL HIGH (ref 43–77)
Platelets: 248 10*3/uL (ref 150–400)
RBC: 4.26 MIL/uL (ref 4.22–5.81)
RDW: 14.9 % (ref 11.5–15.5)
WBC: 9.6 10*3/uL (ref 4.0–10.5)

## 2015-07-29 NOTE — Telephone Encounter (Signed)
Patient walked in, did not realize he had missed an appointment earlier today.  He states has changed phone numbers - RN updated the demographics. Patient complaining of abdominal pain ("sore, like when you've been coughing all day") and small amounts of dark blood from his rectum that occur intermittently.  He also states he is having intermittent fecal incontinence.  Patient states he has a history of internal hemorrhoids, feels that this might be the problem flaring up again.  RN gave patient information to apply for an orange card for any potential referrals.  He verbalized agreement, will go to [facility] to apply there.  He is advised to eat a low residue diet and stay hydrated.  He verbalized understanding. Patient renewed his RW/ADAP application while at [facility].  He had labs drawn.  He scheduled a follow up appointment with Dr. Luciana Axe.  He left [facility] for [facility] to reengage in their case management services.  Patient was given 1 bus pass and a note to return to work tomorrow.   Andree Coss, RN

## 2015-07-30 LAB — HIV-1 RNA QUANT-NO REFLEX-BLD
HIV 1 RNA Quant: <20 copies/mL (ref <20)
HIV-1 RNA Quant, Log: <1.3 Log copies/mL (ref <1.30)

## 2015-07-30 LAB — T-HELPER CELL (CD4) - (RCID CLINIC ONLY)
CD4 T CELL ABS: 120 /uL — AB (ref 400–2700)
CD4 T CELL HELPER: 11 % — AB (ref 33–55)

## 2015-07-30 LAB — RPR

## 2015-08-10 ENCOUNTER — Emergency Department (HOSPITAL_COMMUNITY)
Admission: EM | Admit: 2015-08-10 | Discharge: 2015-08-10 | Disposition: A | Discharge status: Home / Self Care | Payer: Self-pay | Attending: Emergency Medicine | Admitting: Emergency Medicine

## 2015-08-10 ENCOUNTER — Encounter (HOSPITAL_COMMUNITY): Payer: Self-pay | Admitting: Emergency Medicine

## 2015-08-10 ENCOUNTER — Emergency Department (HOSPITAL_COMMUNITY): Payer: Self-pay

## 2015-08-10 DIAGNOSIS — F17210 Nicotine dependence, cigarettes, uncomplicated: Secondary | ICD-10-CM | POA: Insufficient documentation

## 2015-08-10 DIAGNOSIS — Z79899 Other long term (current) drug therapy: Secondary | ICD-10-CM | POA: Insufficient documentation

## 2015-08-10 DIAGNOSIS — Z21 Asymptomatic human immunodeficiency virus [HIV] infection status: Secondary | ICD-10-CM

## 2015-08-10 DIAGNOSIS — R197 Diarrhea, unspecified: Secondary | ICD-10-CM | POA: Insufficient documentation

## 2015-08-10 DIAGNOSIS — R059 Cough, unspecified: Secondary | ICD-10-CM

## 2015-08-10 DIAGNOSIS — R05 Cough: Secondary | ICD-10-CM | POA: Insufficient documentation

## 2015-08-10 DIAGNOSIS — B20 Human immunodeficiency virus [HIV] disease: Secondary | ICD-10-CM | POA: Insufficient documentation

## 2015-08-10 DIAGNOSIS — R5081 Fever presenting with conditions classified elsewhere: Secondary | ICD-10-CM | POA: Insufficient documentation

## 2015-08-10 DIAGNOSIS — F329 Major depressive disorder, single episode, unspecified: Secondary | ICD-10-CM | POA: Insufficient documentation

## 2015-08-10 DIAGNOSIS — R509 Fever, unspecified: Secondary | ICD-10-CM

## 2015-08-10 DIAGNOSIS — R51 Headache: Secondary | ICD-10-CM | POA: Insufficient documentation

## 2015-08-10 DIAGNOSIS — Z7952 Long term (current) use of systemic steroids: Secondary | ICD-10-CM | POA: Insufficient documentation

## 2015-08-10 MED ORDER — AEROCHAMBER PLUS W/MASK MISC: Status: DC

## 2015-08-10 MED ORDER — AZITHROMYCIN 250 MG PO TABS: 250.0000 mg | ORAL_TABLET | Freq: Every day | ORAL | Status: DC

## 2015-08-10 MED ORDER — BENZONATATE 100 MG PO CAPS: 100.0000 mg | ORAL_CAPSULE | Freq: Three times a day (TID) | ORAL | Status: DC

## 2015-08-10 MED ORDER — AZITHROMYCIN 250 MG PO TABS
500.0000 mg | ORAL_TABLET | Freq: Once | ORAL | Status: AC
  Administered 2015-08-10: 500 mg via ORAL
  Filled 2015-08-10: qty 2

## 2015-08-10 MED ORDER — ALBUTEROL SULFATE HFA 108 (90 BASE) MCG/ACT IN AERS: 2.0000 | INHALATION_SPRAY | RESPIRATORY_TRACT | Status: DC | PRN

## 2015-08-10 MED ORDER — ACETAMINOPHEN 325 MG PO TABS
650.0000 mg | ORAL_TABLET | Freq: Once | ORAL | Status: AC | PRN
  Administered 2015-08-10: 650 mg via ORAL
  Filled 2015-08-10: qty 2

## 2015-08-10 NOTE — ED Provider Notes (Signed)
CSN: [id number]     Arrival date & time 08/10/15  1700 History   First MD Initiated Contact with Patient 08/10/15 2132     Chief Complaint  Patient presents with  . Cough  . Diarrhea  . Headache     (Consider location/radiation/quality/duration/timing/severity/associated sxs/prior Treatment) HPI Patient states he's had a cough and some fever for approximately 2 days. He reports the cough is dry. He does not have chest pain or shortness of breath. He reports he has also had some diarrhea that is watery. He estimates approximately 3 episodes per day. No vomiting. Some generalized pressure-like headache. No general myalgias, no joint swelling or redness, no rashes. No neck stiffness or confusion. Patient reports he does have a sick contact. He reports that the person that has been driving him to work has also had a cough. Patient works in [employer]. Patient is HIV positive. He reports compliance with his medications. He states his viral load has been undetectable. Past Medical History  Diagnosis Date  . HIV infection (HCC)   . Depression    History reviewed. No pertinent past surgical history. Family History  Problem Relation Age of Onset  . Diabetes Mother   . Hypertension Mother   . Arthritis Mother    Social History  Substance Use Topics  . Smoking status: Current Every Day Smoker -- 0.50 packs/day for 25 years    Types: Cigarettes  . Smokeless tobacco: Never Used     Comment: cutting back  . Alcohol Use: 1.2 oz/week    2 Standard drinks or equivalent per week     Comment: 3-4 days a week.Last drink: today    Review of Systems 10 Systems reviewed and are negative for acute change except as noted in the HPI.    Allergies  Review of patient's allergies indicates no known allergies.  Home Medications   Prior to Admission medications   Medication Sig Start Date End Date Taking? Authorizing Provider  Abacavir-Dolutegravir-Lamivud (TRIUMEQ) 600-50-300 MG TABS Take 1  tablet by mouth daily. 04/14/15  Yes John Salena Saner Withrow, FNP  darunavir-cobicistat (PREZCOBIX) 800-150 MG tablet TAKE 1 TABLET BY MOUTH DAILY. SWALLOW WHOLE. DO NOT CRUSH, BREAK OR CHEW TABLETS. TAKE WITH FOOD 04/14/15  Yes Beau Fanny, FNP  dronabinol (MARINOL) 5 MG capsule Take 1 capsule (5 mg total) by mouth 3 (three) times daily. Managed by outpatient provider 04/14/15  Yes Beau Fanny, FNP  gabapentin (NEURONTIN) 300 MG capsule Take 1 capsule (300 mg total) by mouth 4 (four) times daily -  before meals and at bedtime. 04/15/15  Yes Oneta Rack, NP  hydrocortisone cream 1 % Apply topically 4 (four) times daily. 04/15/15  Yes Oneta Rack, NP  hydrOXYzine (ATARAX/VISTARIL) 25 MG tablet Take 1 tablet (25 mg total) by mouth 4 (four) times daily as needed for anxiety (Sleep). 04/15/15  Yes Oneta Rack, NP  nicotine (NICODERM CQ - DOSED IN MG/24 HOURS) 21 mg/24hr patch Place 1 patch (21 mg total) onto the skin daily. 04/15/15  Yes Oneta Rack, NP  albuterol (PROVENTIL HFA;VENTOLIN HFA) 108 (90 Base) MCG/ACT inhaler Inhale 2 puffs into the lungs every 2 (two) hours as needed for wheezing or shortness of breath (cough). 08/10/15   Arby Barrette, MD  azithromycin (ZITHROMAX) 250 MG tablet Take 1 tablet (250 mg total) by mouth daily. 1 every day until finished. 08/10/15   Arby Barrette, MD  benzonatate (TESSALON) 100 MG capsule Take 1 capsule (100 mg total) by  mouth every 8 (eight) hours. 08/10/15   Arby Barrette, MD  docusate sodium (COLACE) 100 MG capsule Take 1 capsule (100 mg total) by mouth 2 (two) times daily. Patient not taking: Reported on 08/10/2015 04/15/15   Oneta Rack, NP  PREZCOBIX 800-150 MG tablet TAKE 1 TABLET BY MOUTH DAILY. SWALLOW WHOLE. DO NOT CRUSH, BREAK OR CHEW TABLETS. TAKE WITH FOOD Patient not taking: Reported on 08/10/2015 04/19/15   Ginnie Smart, MD  QUEtiapine (SEROQUEL) 200 MG tablet Take 1 tablet (200 mg total) by mouth at bedtime. Patient not taking: Reported on  08/10/2015 04/15/15   Oneta Rack, NP  Spacer/Aero-Holding Chambers (AEROCHAMBER PLUS WITH MASK) inhaler Use as instructed 08/10/15   Arby Barrette, MD  sulfamethoxazole-trimethoprim (BACTRIM DS,SEPTRA DS) 800-160 MG tablet Take 1 tablet by mouth daily. Patient not taking: Reported on 08/10/2015 04/14/15   Beau Fanny, FNP  TRIUMEQ 600-50-300 MG TABS TAKE 1 TABLET BY MOUTH DAILY Patient not taking: Reported on 08/10/2015 04/19/15   Ginnie Smart, MD   BP 115/91 mmHg  Pulse 101  Temp(Src) 99 F (37.2 C) (Oral)  Resp 16  SpO2 99% Physical Exam  Constitutional: He is oriented to person, place, and time. He appears well-developed and well-nourished.  HENT:  Head: Normocephalic and atraumatic.  Nose: Nose normal.  Mouth/Throat: Oropharynx is clear and moist.  Eyes: EOM are normal. Pupils are equal, round, and reactive to light.  Neck: Neck supple.  Cardiovascular: Normal rate, regular rhythm, normal heart sounds and intact distal pulses.   Pulmonary/Chest: Effort normal and breath sounds normal.  Abdominal: Soft. Bowel sounds are normal. He exhibits no distension. There is no tenderness.  Musculoskeletal: Normal range of motion. He exhibits no edema.  Lymphadenopathy:    He has no cervical adenopathy.  Neurological: He is alert and oriented to person, place, and time. He has normal strength. No cranial nerve deficit. He exhibits normal muscle tone. Coordination normal. GCS eye subscore is 4. GCS verbal subscore is 5. GCS motor subscore is 6.  Skin: Skin is warm, dry and intact.  Psychiatric: He has a normal mood and affect.    ED Course  Procedures (including critical care time) Labs Review Labs Reviewed - No data to display  Imaging Review Dg Chest 2 View  08/10/2015  CLINICAL DATA:  Cough, congestion, fever, body aches EXAM: CHEST  2 VIEW COMPARISON:  03/19/2015 FINDINGS: There is no focal parenchymal opacity. There is no pleural effusion or pneumothorax. The heart and  mediastinal contours are unremarkable. The osseous structures are unremarkable. IMPRESSION: No active cardiopulmonary disease. Electronically Signed   By: Elige Ko   On: 08/10/2015 18:44   I have personally reviewed and evaluated these images and lab results as part of my medical decision-making.   EKG Interpretation None      MDM   Final diagnoses:  Cough  Other specified fever  HIV (human immunodeficiency virus infection) (HCC)   Patient clinically well in appearance. Blood pressures are stable. Patient has normal physical examination. Chest x-ray does not identify areas of consolidation or diffuse patchy infiltrates. At this time, I feel the patient is safe for outpatient management. He has undetectable viral load and reports compliance with his HIV medications. At this time he'll be treated empirically with Zithromax and given Tessalon Perles and albuterol for coughing. Patient is counseled to follow up with his infectious disease doctor this week. Signs and symptoms for which return are reviewed.    Arby Barrette, MD  08/10/15 2239 

## 2015-08-10 NOTE — ED Notes (Signed)
Patient c/o cough x1 week, watery diarrhea 6-7 episodes, cough, headache

## 2015-08-10 NOTE — Discharge Instructions (Signed)
Cough, Adult °Coughing is a reflex that clears your throat and your airways. Coughing helps to heal and protect your lungs. It is normal to cough occasionally, but a cough that happens with other symptoms or lasts a long time may be a sign of a condition that needs treatment. A cough may last only 2-3 weeks (acute), or it may last longer than 8 weeks (chronic). °CAUSES °Coughing is commonly caused by: °· Breathing in substances that irritate your lungs. °· A viral or bacterial respiratory infection. °· Allergies. °· Asthma. °· Postnasal drip. °· Smoking. °· Acid backing up from the stomach into the esophagus (gastroesophageal reflux). °· Certain medicines. °· Chronic lung problems, including COPD (or rarely, lung cancer). °· Other medical conditions such as heart failure. °HOME CARE INSTRUCTIONS  °Pay attention to any changes in your symptoms. Take these actions to help with your discomfort: °· Take medicines only as told by your health care provider. °· If you were prescribed an antibiotic medicine, take it as told by your health care provider. Do not stop taking the antibiotic even if you start to feel better. °· Talk with your health care provider before you take a cough suppressant medicine. °· Drink enough fluid to keep your urine clear or pale yellow. °· If the air is dry, use a cold steam vaporizer or humidifier in your bedroom or your home to help loosen secretions. °· Avoid anything that causes you to cough at work or at home. °· If your cough is worse at night, try sleeping in a semi-upright position. °· Avoid cigarette smoke. If you smoke, quit smoking. If you need help quitting, ask your health care provider. °· Avoid caffeine. °· Avoid alcohol. °· Rest as needed. °SEEK MEDICAL CARE IF:  °· You have new symptoms. °· You cough up pus. °· Your cough does not get better after 2-3 weeks, or your cough gets worse. °· You cannot control your cough with suppressant medicines and you are losing sleep. °· You  develop pain that is getting worse or pain that is not controlled with pain medicines. °· You have a fever. °· You have unexplained weight loss. °· You have night sweats. °SEEK IMMEDIATE MEDICAL CARE IF: °· You cough up blood. °· You have difficulty breathing. °· Your heartbeat is very fast. °  °This information is not intended to replace advice given to you by your health care provider. Make sure you discuss any questions you have with your health care provider. °  °Document Released: 11/25/2010 Document Revised: 02/17/2015 Document Reviewed: 08/05/2014 °Elsevier Interactive Patient Education ©2016 [employer] ° °Fever, Adult °A fever is an increase in the body's temperature. It is usually defined as a temperature of 100°F (38°C) or higher. Brief mild or moderate fevers generally have no long-term effects, and they often do not require treatment. Moderate or high fevers may make you feel uncomfortable and can sometimes be a sign of a serious illness or disease. The sweating that may occur with repeated or prolonged fever may also cause dehydration. °Fever is confirmed by taking a temperature with a thermometer. A measured temperature can vary with: °· Age. °· Time of day. °· Location of the thermometer: °¨ Mouth (oral). °¨ Rectum (rectal). °¨ Ear (tympanic). °¨ Underarm (axillary). °¨ Forehead (temporal). °HOME CARE INSTRUCTIONS °Pay attention to any changes in your symptoms. Take these actions to help with your condition: °· Take over-the counter and prescription medicines only as told by your health care provider. Follow the dosing instructions   carefully. °· If you were prescribed an antibiotic medicine, take it as told by your health care provider. Do not stop taking the antibiotic even if you start to feel better. °· Rest as needed. °· Drink enough fluid to keep your urine clear or pale yellow. This helps to prevent dehydration. °· Sponge yourself or bathe with room-temperature water to help reduce your body  temperature as needed. Do not use ice water. °· Do not overbundle yourself in blankets or heavy clothes. °SEEK MEDICAL CARE IF: °· You vomit. °· You cannot eat or drink without vomiting. °· You have diarrhea. °· You have pain when you urinate. °· Your symptoms do not improve with treatment. °· You develop new symptoms. °· You develop excessive weakness. °SEEK IMMEDIATE MEDICAL CARE IF: °· You have shortness of breath or have trouble breathing. °· You are dizzy or you faint. °· You are disoriented or confused. °· You develop signs of dehydration, such as a dry mouth, decreased urination, or paleness. °· You develop severe pain in your abdomen. °· You have persistent vomiting or diarrhea. °· You develop a skin rash. °· Your symptoms suddenly get worse. °  °This information is not intended to replace advice given to you by your health care provider. Make sure you discuss any questions you have with your health care provider. °  °Document Released: 11/22/2000 Document Revised: 02/17/2015 Document Reviewed: 07/23/2014 °Elsevier Interactive Patient Education ©2016 [employer] ° °

## 2015-09-01 ENCOUNTER — Ambulatory Visit: Payer: Self-pay | Admitting: *Deleted

## 2015-09-14 ENCOUNTER — Observation Stay (HOSPITAL_COMMUNITY)
Admission: AD | Admit: 2015-09-14 | Discharge: 2015-09-16 | Disposition: A | Discharge status: Home / Self Care | Payer: Federal, State, Local not specified - Other | Source: Intra-hospital | Attending: Psychiatry | Admitting: Psychiatry

## 2015-09-14 ENCOUNTER — Encounter (HOSPITAL_COMMUNITY): Payer: Self-pay

## 2015-09-14 ENCOUNTER — Emergency Department (HOSPITAL_COMMUNITY)
Admission: EM | Admit: 2015-09-14 | Discharge: 2015-09-14 | Disposition: A | Discharge status: Other Acute Inpatient Hospital | Payer: Self-pay | Attending: Emergency Medicine | Admitting: Emergency Medicine

## 2015-09-14 ENCOUNTER — Encounter (HOSPITAL_COMMUNITY): Payer: Self-pay | Admitting: Emergency Medicine

## 2015-09-14 DIAGNOSIS — F331 Major depressive disorder, recurrent, moderate: Principal | ICD-10-CM | POA: Insufficient documentation

## 2015-09-14 DIAGNOSIS — F17210 Nicotine dependence, cigarettes, uncomplicated: Secondary | ICD-10-CM | POA: Insufficient documentation

## 2015-09-14 DIAGNOSIS — Z818 Family history of other mental and behavioral disorders: Secondary | ICD-10-CM | POA: Insufficient documentation

## 2015-09-14 DIAGNOSIS — Z21 Asymptomatic human immunodeficiency virus [HIV] infection status: Secondary | ICD-10-CM | POA: Insufficient documentation

## 2015-09-14 DIAGNOSIS — Z590 Homelessness: Secondary | ICD-10-CM | POA: Insufficient documentation

## 2015-09-14 DIAGNOSIS — F1994 Other psychoactive substance use, unspecified with psychoactive substance-induced mood disorder: Secondary | ICD-10-CM | POA: Diagnosis present

## 2015-09-14 DIAGNOSIS — Z915 Personal history of self-harm: Secondary | ICD-10-CM | POA: Insufficient documentation

## 2015-09-14 DIAGNOSIS — F1424 Cocaine dependence with cocaine-induced mood disorder: Secondary | ICD-10-CM | POA: Insufficient documentation

## 2015-09-14 DIAGNOSIS — R45850 Homicidal ideations: Secondary | ICD-10-CM | POA: Insufficient documentation

## 2015-09-14 DIAGNOSIS — B20 Human immunodeficiency virus [HIV] disease: Secondary | ICD-10-CM | POA: Insufficient documentation

## 2015-09-14 DIAGNOSIS — F1420 Cocaine dependence, uncomplicated: Secondary | ICD-10-CM | POA: Diagnosis present

## 2015-09-14 DIAGNOSIS — F332 Major depressive disorder, recurrent severe without psychotic features: Secondary | ICD-10-CM | POA: Insufficient documentation

## 2015-09-14 DIAGNOSIS — F1014 Alcohol abuse with alcohol-induced mood disorder: Secondary | ICD-10-CM | POA: Insufficient documentation

## 2015-09-14 DIAGNOSIS — F1290 Cannabis use, unspecified, uncomplicated: Secondary | ICD-10-CM | POA: Insufficient documentation

## 2015-09-14 DIAGNOSIS — R45851 Suicidal ideations: Secondary | ICD-10-CM | POA: Insufficient documentation

## 2015-09-14 DIAGNOSIS — F1410 Cocaine abuse, uncomplicated: Secondary | ICD-10-CM | POA: Insufficient documentation

## 2015-09-14 DIAGNOSIS — F419 Anxiety disorder, unspecified: Secondary | ICD-10-CM | POA: Insufficient documentation

## 2015-09-14 DIAGNOSIS — Z7952 Long term (current) use of systemic steroids: Secondary | ICD-10-CM | POA: Insufficient documentation

## 2015-09-14 DIAGNOSIS — F1210 Cannabis abuse, uncomplicated: Secondary | ICD-10-CM | POA: Insufficient documentation

## 2015-09-14 DIAGNOSIS — Z91410 Personal history of adult physical and sexual abuse: Secondary | ICD-10-CM | POA: Insufficient documentation

## 2015-09-14 DIAGNOSIS — F1220 Cannabis dependence, uncomplicated: Secondary | ICD-10-CM | POA: Diagnosis present

## 2015-09-14 LAB — RAPID URINE DRUG SCREEN, HOSP PERFORMED
Amphetamines: NOT DETECTED
BENZODIAZEPINES: NOT DETECTED
Barbiturates: NOT DETECTED
COCAINE: POSITIVE — AB
OPIATES: NOT DETECTED
Tetrahydrocannabinol: POSITIVE — AB

## 2015-09-14 LAB — COMPREHENSIVE METABOLIC PANEL
ALT: 13 U/L — ABNORMAL LOW (ref 17–63)
ANION GAP: 8 (ref 5–15)
AST: 24 U/L (ref 15–41)
Albumin: 4.1 g/dL (ref 3.5–5.0)
Alkaline Phosphatase: 111 U/L (ref 38–126)
BILIRUBIN TOTAL: 0.5 mg/dL (ref 0.3–1.2)
BUN: 14 mg/dL (ref 6–20)
CHLORIDE: 109 mmol/L (ref 101–111)
CO2: 27 mmol/L (ref 22–32)
Calcium: 9.6 mg/dL (ref 8.9–10.3)
Creatinine, Ser: 1.07 mg/dL (ref 0.61–1.24)
GFR calc Af Amer: >60 mL/min (ref >60)
Glucose, Bld: 65 mg/dL (ref 65–99)
POTASSIUM: 4.4 mmol/L (ref 3.5–5.1)
Sodium: 144 mmol/L (ref 135–145)
TOTAL PROTEIN: 8.1 g/dL (ref 6.5–8.1)

## 2015-09-14 LAB — ACETAMINOPHEN LEVEL

## 2015-09-14 LAB — CBC
HCT: 41.7 % (ref 39.0–52.0)
Hemoglobin: 13.9 g/dL (ref 13.0–17.0)
MCH: 31.5 pg (ref 26.0–34.0)
MCHC: 33.3 g/dL (ref 30.0–36.0)
MCV: 94.6 fL (ref 78.0–100.0)
PLATELETS: 246 10*3/uL (ref 150–400)
RBC: 4.41 MIL/uL (ref 4.22–5.81)
RDW: 14.7 % (ref 11.5–15.5)
WBC: 13 10*3/uL — AB (ref 4.0–10.5)

## 2015-09-14 LAB — ETHANOL

## 2015-09-14 LAB — SALICYLATE LEVEL

## 2015-09-14 MED ORDER — IBUPROFEN 200 MG PO TABS: 600.0000 mg | ORAL_TABLET | Freq: Three times a day (TID) | ORAL | Status: DC | PRN

## 2015-09-14 MED ORDER — ALUM & MAG HYDROXIDE-SIMETH 200-200-20 MG/5ML PO SUSP
30.0000 mL | ORAL | Status: DC | PRN
Start: 2015-09-14 — End: 2015-09-14

## 2015-09-14 MED ORDER — LORAZEPAM 1 MG PO TABS: 1.0000 mg | ORAL_TABLET | Freq: Three times a day (TID) | ORAL | Status: DC | PRN

## 2015-09-14 MED ORDER — ACETAMINOPHEN 325 MG PO TABS: 650.0000 mg | ORAL_TABLET | ORAL | Status: DC | PRN

## 2015-09-14 NOTE — BH Assessment (Signed)
Patient meets criteria for OBS Unit per Julieanne CottonJosephine, NP. The attending provider for the Unit is Dr. Lucianne MussKumar. Patient accepted to OBS bed #6 after 8pm. Nursing report [phone]. Patient's support paperwork has been completed. Patient is voluntary. Patient's support paperwork was completed. [facility] nursing staff will need to make transportation arrangements with [facility].

## 2015-09-14 NOTE — ED Provider Notes (Signed)
CSN: [id number]     Arrival date & time 09/14/15  1554 History   First MD Initiated Contact with Patient 09/14/15 1557     Chief Complaint  Patient presents with  . Medical Clearance     (Consider location/radiation/quality/duration/timing/severity/associated sxs/prior Treatment) Patient is a 40 y.o. male presenting with mental health disorder.  Mental Health Problem Presenting symptoms: suicidal thoughts   Degree of incapacity (severity):  Mild Onset quality:  Gradual Duration:  3 weeks Timing:  Constant Progression:  Worsening Associated symptoms: anxiety   Associated symptoms: no abdominal pain, no chest pain and no headaches     Past Medical History  Diagnosis Date  . HIV infection (HCC)   . Depression    History reviewed. No pertinent past surgical history. Family History  Problem Relation Age of Onset  . Diabetes Mother   . Hypertension Mother   . Arthritis Mother    Social History  Substance Use Topics  . Smoking status: Current Every Day Smoker -- 0.50 packs/day for 25 years    Types: Cigarettes  . Smokeless tobacco: Never Used     Comment: cutting back  . Alcohol Use: 1.2 oz/week    2 Standard drinks or equivalent per week     Comment: 3-4 days a week.Last drink: today    Review of Systems  Constitutional: Negative for fever, chills and activity change.  HENT: Negative for congestion and rhinorrhea.   Eyes: Negative for visual disturbance.  Respiratory: Negative for cough and shortness of breath.   Cardiovascular: Negative for chest pain.  Gastrointestinal: Negative for vomiting, abdominal pain, diarrhea and constipation.  Endocrine: Negative for polyuria.  Genitourinary: Negative for dysuria and flank pain.  Musculoskeletal: Negative for back pain and neck pain.  Skin: Negative for wound.  Neurological: Negative for headaches.  Psychiatric/Behavioral: Positive for suicidal ideas. The patient is nervous/anxious.       Allergies  Review of  patient's allergies indicates no known allergies.  Home Medications   Prior to Admission medications   Medication Sig Start Date End Date Taking? Authorizing Provider  Abacavir-Dolutegravir-Lamivud (TRIUMEQ) 600-50-300 MG TABS Take 1 tablet by mouth daily. 04/14/15   Beau FannyJohn C Withrow, FNP  albuterol (PROVENTIL HFA;VENTOLIN HFA) 108 (90 Base) MCG/ACT inhaler Inhale 2 puffs into the lungs every 2 (two) hours as needed for wheezing or shortness of breath (cough). 08/10/15   Arby BarretteMarcy Pfeiffer, MD  azithromycin (ZITHROMAX) 250 MG tablet Take 1 tablet (250 mg total) by mouth daily. 1 every day until finished. 08/10/15   Arby BarretteMarcy Pfeiffer, MD  benzonatate (TESSALON) 100 MG capsule Take 1 capsule (100 mg total) by mouth every 8 (eight) hours. 08/10/15   Arby BarretteMarcy Pfeiffer, MD  darunavir-cobicistat (PREZCOBIX) 800-150 MG tablet TAKE 1 TABLET BY MOUTH DAILY. SWALLOW WHOLE. DO NOT CRUSH, BREAK OR CHEW TABLETS. TAKE WITH FOOD 04/14/15   Beau FannyJohn C Withrow, FNP  docusate sodium (COLACE) 100 MG capsule Take 1 capsule (100 mg total) by mouth 2 (two) times daily. Patient not taking: Reported on 08/10/2015 04/15/15   Oneta Rackanika N Lewis, NP  dronabinol (MARINOL) 5 MG capsule Take 1 capsule (5 mg total) by mouth 3 (three) times daily. Managed by outpatient provider 04/14/15   Beau FannyJohn C Withrow, FNP  gabapentin (NEURONTIN) 300 MG capsule Take 1 capsule (300 mg total) by mouth 4 (four) times daily -  before meals and at bedtime. 04/15/15   Oneta Rackanika N Lewis, NP  hydrocortisone cream 1 % Apply topically 4 (four) times daily. 04/15/15   Jerene Pitchanika N  Lewis, NP  hydrOXYzine (ATARAX/VISTARIL) 25 MG tablet Take 1 tablet (25 mg total) by mouth 4 (four) times daily as needed for anxiety (Sleep). 04/15/15   Oneta Rack, NP  nicotine (NICODERM CQ - DOSED IN MG/24 HOURS) 21 mg/24hr patch Place 1 patch (21 mg total) onto the skin daily. 04/15/15   Oneta Rack, NP  PREZCOBIX 800-150 MG tablet TAKE 1 TABLET BY MOUTH DAILY. SWALLOW WHOLE. DO NOT CRUSH, BREAK OR CHEW  TABLETS. TAKE WITH FOOD Patient not taking: Reported on 08/10/2015 04/19/15   Ginnie Smart, MD  QUEtiapine (SEROQUEL) 200 MG tablet Take 1 tablet (200 mg total) by mouth at bedtime. Patient not taking: Reported on 08/10/2015 04/15/15   Oneta Rack, NP  Spacer/Aero-Holding Chambers (AEROCHAMBER PLUS WITH MASK) inhaler Use as instructed 08/10/15   Arby Barrette, MD  sulfamethoxazole-trimethoprim (BACTRIM DS,SEPTRA DS) 800-160 MG tablet Take 1 tablet by mouth daily. Patient not taking: Reported on 08/10/2015 04/14/15   Beau Fanny, FNP  TRIUMEQ 600-50-300 MG TABS TAKE 1 TABLET BY MOUTH DAILY Patient not taking: Reported on 08/10/2015 04/19/15   Ginnie Smart, MD   BP 117/90 mmHg  Pulse 102  Temp(Src) 98.5 F (36.9 C) (Oral)  Resp 18  SpO2 98% Physical Exam  Constitutional: He appears well-developed and well-nourished.  HENT:  Head: Normocephalic and atraumatic.  Neck: Normal range of motion.  Cardiovascular: Normal rate.   Pulmonary/Chest: Effort normal. No respiratory distress.  Abdominal: He exhibits no distension.  Musculoskeletal: Normal range of motion.  Neurological: He is alert.  Psychiatric: His affect is labile. He is agitated and slowed. He exhibits a depressed mood. He expresses suicidal ideation. He expresses no suicidal plans.  Nursing note and vitals reviewed.   ED Course  Procedures (including critical care time) Labs Review Labs Reviewed  COMPREHENSIVE METABOLIC PANEL - Abnormal; Notable for the following:    ALT 13 (*)    All other components within normal limits  ACETAMINOPHEN LEVEL - Abnormal; Notable for the following:    Acetaminophen (Tylenol), Serum <10 (*)    All other components within normal limits  CBC - Abnormal; Notable for the following:    WBC 13.0 (*)    All other components within normal limits  URINE RAPID DRUG SCREEN, HOSP PERFORMED - Abnormal; Notable for the following:    Cocaine POSITIVE (*)    Tetrahydrocannabinol POSITIVE (*)     All other components within normal limits  ETHANOL  SALICYLATE LEVEL    Imaging Review No results found. I have personally reviewed and evaluated these images and lab results as part of my medical decision-making.   EKG Interpretation None      MDM   Final diagnoses:  Severe recurrent major depression without psychotic features (HCC)   Schizophrenia, ran out of rx a couple weeks ago and didn't get it refilled. Apathy, depression, vague suicidal ideation since that time. Also with thoughts of wanting to hurt others. No obvious medical cause for his issues. Will consult tts.   TTS to obs and start meds back. EMTALA transfer form filled out.      Marily Memos, MD 09/15/15 1335

## 2015-09-14 NOTE — ED Notes (Signed)
Pt oriented to room and unit. Pt is calm and cooperative.  Denies S/I and H/I at this time.  15 minute checks and video monitoring continue.
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
## 2015-09-14 NOTE — ED Notes (Signed)
Report called to RN Brett CanalesSteve, OBS Unit.  Pending [facility] transport at 9pm.

## 2015-09-14 NOTE — ED Notes (Signed)
Pt AAO x 3, no distress noted, depressed with passive SI, contracts for safety.  Monitoring for safety, Q 15 min checks in effect.  Pending report to OBS unit and [facility] transport.

## 2015-09-14 NOTE — ED Notes (Signed)
Patient presents for medical clearance. Reports he ran out of his prescriptions approximately 2-3 weeks ago and has had racing thoughts of "not wanting to exist anymore". Patient denies plan, HI/AVH. Endorses ETOH (last used yesterday), marijuana, cocaine (last used yesterday). C/o bilateral foot swelling. A&O x4.

## 2015-09-15 DIAGNOSIS — F331 Major depressive disorder, recurrent, moderate: Secondary | ICD-10-CM | POA: Diagnosis not present

## 2015-09-15 DIAGNOSIS — F1994 Other psychoactive substance use, unspecified with psychoactive substance-induced mood disorder: Secondary | ICD-10-CM

## 2015-09-15 DIAGNOSIS — F1220 Cannabis dependence, uncomplicated: Secondary | ICD-10-CM | POA: Diagnosis not present

## 2015-09-15 DIAGNOSIS — F1420 Cocaine dependence, uncomplicated: Secondary | ICD-10-CM | POA: Diagnosis not present

## 2015-09-15 MED ORDER — ABACAVIR-DOLUTEGRAVIR-LAMIVUD 600-50-300 MG PO TABS
1.0000 | ORAL_TABLET | Freq: Every day | ORAL | Status: DC
  Administered 2015-09-15 – 2015-09-16 (×2): 1 via ORAL
  Filled 2015-09-15 (×3): qty 1

## 2015-09-15 MED ORDER — MAGNESIUM HYDROXIDE 400 MG/5ML PO SUSP: 30.0000 mL | Freq: Every day | ORAL | Status: DC | PRN

## 2015-09-15 MED ORDER — NON FORMULARY: 1.0000 | Freq: Once | Status: DC

## 2015-09-15 MED ORDER — ALUM & MAG HYDROXIDE-SIMETH 200-200-20 MG/5ML PO SUSP: 30.0000 mL | ORAL | Status: DC | PRN

## 2015-09-15 MED ORDER — ABACAVIR-DOLUTEGRAVIR-LAMIVUD 600-50-300 MG PO TABS
1.0000 | ORAL_TABLET | Freq: Every day | ORAL | Status: DC
  Filled 2015-09-15 (×2): qty 1

## 2015-09-15 MED ORDER — DARUNAVIR-COBICISTAT 800-150 MG PO TABS
1.0000 | ORAL_TABLET | Freq: Every day | ORAL | Status: DC
  Administered 2015-09-15 – 2015-09-16 (×2): 1 via ORAL
  Filled 2015-09-15 (×3): qty 1

## 2015-09-15 MED ORDER — ACETAMINOPHEN 325 MG PO TABS
650.0000 mg | ORAL_TABLET | Freq: Four times a day (QID) | ORAL | Status: DC | PRN
  Administered 2015-09-15 (×2): 650 mg via ORAL
  Filled 2015-09-15 (×2): qty 2

## 2015-09-15 MED ORDER — DARUNAVIR-COBICISTAT 800-150 MG PO TABS
1.0000 | ORAL_TABLET | Freq: Every day | ORAL | Status: DC
  Filled 2015-09-15 (×2): qty 1

## 2015-09-15 MED ORDER — NICOTINE 21 MG/24HR TD PT24
21.0000 mg | MEDICATED_PATCH | Freq: Every day | TRANSDERMAL | Status: DC
  Administered 2015-09-15 – 2015-09-16 (×2): 21 mg via TRANSDERMAL
  Filled 2015-09-15 (×2): qty 1

## 2015-09-15 NOTE — BH Assessment (Addendum)
[facility] Assessment Progress Note    Patient cannot contract for safety and will be held in the Observation Unit until 09/16/15 when they will be re-evaluated in the a.m.

## 2015-09-15 NOTE — BHH Counselor (Signed)
This Clinical research associatewriter spoke with pt in regards to his discharge plan. Pt openly voiced concerns about getting back on a medication regimen once he is discharged, pt also expressed interest in going to a residential program but sts that he does not have insurance. This Clinical research associatewriter informed pt that he will be evaluated in the morning by an extender and he can request prescriptions for his medications. This Clinical research associatewriter also submitted [facility]. Pt will need to follow up with [facility] in the morning in regards to placement.   Ardelle ParkLatoya McNeil, MA OBS Counselor

## 2015-09-15 NOTE — Progress Notes (Signed)
Pt sts pain in L foot "pinky" area. Pt given 650 Tylenol.

## 2015-09-15 NOTE — Progress Notes (Signed)
D:Pt reports si thoughts with no specific plan. He denies hi thoughts and says that he may feel like hurting someone if he was not in the hospital. Pt asked for fixadent and pharmacy was notified. Pt is restless and pleasant on the unit. A:Offered support, encouragement and observation. R:Pt contracts with staff for safety. Safety maintained in the OBS unit.

## 2015-09-15 NOTE — Progress Notes (Signed)
Pt has swollen L foot around the small toe.  Pt reports reduced ROM and thinks he has gout.  +2 pulses and foot is warm to touch.

## 2015-09-15 NOTE — Progress Notes (Signed)
Pt expressing fear and anxiety about possible discharge in am of 09/16/15.  Pt anxiety level high and Pt verbalizes inability to contract for safety. Pt sts he is back where he started from if he cant get into a in pt or out pt program and get back on his meds he would do fine.  Pt is employed at [employer] and can return to work.  Pt previously taking Neurontin, Seroquel and Vistaril per pt narration.   Discussed with pt to let MD know on morining rounds and to determine his options.  Pt understands and will speak with MD in am.  Pt feels better about possible discharge in am.  Pt agrees to verbally contract for safety. Continue to monitor for safety.
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
## 2015-09-15 NOTE — Progress Notes (Signed)
Pt has full set of dentures.  Pt requesting some Fixodent

## 2015-09-16 DIAGNOSIS — F331 Major depressive disorder, recurrent, moderate: Secondary | ICD-10-CM | POA: Diagnosis not present

## 2015-09-16 DIAGNOSIS — F1994 Other psychoactive substance use, unspecified with psychoactive substance-induced mood disorder: Secondary | ICD-10-CM | POA: Diagnosis not present

## 2015-09-16 DIAGNOSIS — F1220 Cannabis dependence, uncomplicated: Secondary | ICD-10-CM | POA: Diagnosis not present

## 2015-09-16 DIAGNOSIS — F1420 Cocaine dependence, uncomplicated: Secondary | ICD-10-CM | POA: Diagnosis not present

## 2015-09-16 MED ORDER — ABACAVIR-DOLUTEGRAVIR-LAMIVUD 600-50-300 MG PO TABS: 1.0000 | ORAL_TABLET | Freq: Every day | ORAL | Status: DC

## 2015-09-16 MED ORDER — MIRTAZAPINE 7.5 MG PO TABS: 7.5000 mg | ORAL_TABLET | Freq: Every day | ORAL | Status: DC

## 2015-09-16 MED ORDER — GABAPENTIN 300 MG PO CAPS: 300.0000 mg | ORAL_CAPSULE | Freq: Three times a day (TID) | ORAL | Status: DC

## 2015-09-16 MED ORDER — QUETIAPINE FUMARATE 100 MG PO TABS
100.0000 mg | ORAL_TABLET | Freq: Once | ORAL | Status: AC
  Administered 2015-09-16: 100 mg via ORAL
  Filled 2015-09-16: qty 1

## 2015-09-16 MED ORDER — MIRTAZAPINE 15 MG PO TABS: 7.5000 mg | ORAL_TABLET | Freq: Every day | ORAL | Status: DC

## 2015-09-16 MED ORDER — MIRTAZAPINE 15 MG PO TABS
7.5000 mg | ORAL_TABLET | Freq: Once | ORAL | Status: AC
  Administered 2015-09-16: 7.5 mg via ORAL
  Filled 2015-09-16: qty 1

## 2015-09-16 MED ORDER — NICOTINE 21 MG/24HR TD PT24: 21.0000 mg | MEDICATED_PATCH | Freq: Every day | TRANSDERMAL | Status: DC

## 2015-09-16 MED ORDER — QUETIAPINE FUMARATE 200 MG PO TABS: 200.0000 mg | ORAL_TABLET | Freq: Every day | ORAL | Status: DC

## 2015-09-16 NOTE — Progress Notes (Signed)
Pt discharged home. DC instructions provided and explained. Medications reviewed, Rx given. All questions answered. Pt stable at discharge. Denies SI, HI, AVH.
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
## 2015-09-16 NOTE — Discharge Instructions (Signed)
You are encouraged to follow up with [facility] for medication management and continuation of care. You are also encouraged to follow up with [facility]  for outpatient substance abuse care as per your request.

## 2015-09-17 ENCOUNTER — Telehealth: Payer: Self-pay | Admitting: *Deleted

## 2015-09-17 NOTE — Telephone Encounter (Signed)
Prior to today RN has made attempts to contact the patient without success. RN placed a reminder to follow up with patient again. RN noted in the chart that the patient is [facility] with plans to be discharged to a inpatient facility. I will continue to follow the patient with hopes of offering services

## 2015-10-08 DIAGNOSIS — Z7951 Long term (current) use of inhaled steroids: Secondary | ICD-10-CM | POA: Insufficient documentation

## 2015-10-08 DIAGNOSIS — Z79899 Other long term (current) drug therapy: Secondary | ICD-10-CM | POA: Insufficient documentation

## 2015-10-08 DIAGNOSIS — R45851 Suicidal ideations: Secondary | ICD-10-CM | POA: Insufficient documentation

## 2015-10-08 DIAGNOSIS — F17210 Nicotine dependence, cigarettes, uncomplicated: Secondary | ICD-10-CM | POA: Insufficient documentation

## 2015-10-08 DIAGNOSIS — F329 Major depressive disorder, single episode, unspecified: Secondary | ICD-10-CM | POA: Insufficient documentation

## 2015-10-09 ENCOUNTER — Inpatient Hospital Stay (HOSPITAL_COMMUNITY)
Admission: AD | Admit: 2015-10-09 | Discharge: 2015-10-12 | DRG: 885 | Disposition: A | Discharge status: Home / Self Care | Payer: No Typology Code available for payment source | Source: Intra-hospital | Attending: Psychiatry | Admitting: Psychiatry

## 2015-10-09 ENCOUNTER — Encounter (HOSPITAL_COMMUNITY): Payer: Self-pay | Admitting: Emergency Medicine

## 2015-10-09 ENCOUNTER — Emergency Department (HOSPITAL_COMMUNITY)
Admission: EM | Admit: 2015-10-09 | Discharge: 2015-10-09 | Disposition: A | Discharge status: Home / Self Care | Payer: Self-pay | Attending: Emergency Medicine | Admitting: Emergency Medicine

## 2015-10-09 DIAGNOSIS — Z833 Family history of diabetes mellitus: Secondary | ICD-10-CM

## 2015-10-09 DIAGNOSIS — F419 Anxiety disorder, unspecified: Secondary | ICD-10-CM | POA: Diagnosis present

## 2015-10-09 DIAGNOSIS — B20 Human immunodeficiency virus [HIV] disease: Secondary | ICD-10-CM | POA: Diagnosis present

## 2015-10-09 DIAGNOSIS — F332 Major depressive disorder, recurrent severe without psychotic features: Secondary | ICD-10-CM | POA: Diagnosis not present

## 2015-10-09 DIAGNOSIS — G4700 Insomnia, unspecified: Secondary | ICD-10-CM | POA: Diagnosis present

## 2015-10-09 DIAGNOSIS — F1424 Cocaine dependence with cocaine-induced mood disorder: Secondary | ICD-10-CM

## 2015-10-09 DIAGNOSIS — Z8261 Family history of arthritis: Secondary | ICD-10-CM | POA: Diagnosis not present

## 2015-10-09 DIAGNOSIS — F17210 Nicotine dependence, cigarettes, uncomplicated: Secondary | ICD-10-CM | POA: Diagnosis present

## 2015-10-09 DIAGNOSIS — R45851 Suicidal ideations: Secondary | ICD-10-CM

## 2015-10-09 DIAGNOSIS — F1420 Cocaine dependence, uncomplicated: Secondary | ICD-10-CM | POA: Diagnosis present

## 2015-10-09 DIAGNOSIS — F1220 Cannabis dependence, uncomplicated: Secondary | ICD-10-CM

## 2015-10-09 DIAGNOSIS — Z8249 Family history of ischemic heart disease and other diseases of the circulatory system: Secondary | ICD-10-CM

## 2015-10-09 LAB — CBC
HCT: 39.6 % (ref 39.0–52.0)
HEMOGLOBIN: 13.3 g/dL (ref 13.0–17.0)
MCH: 31.1 pg (ref 26.0–34.0)
MCHC: 33.6 g/dL (ref 30.0–36.0)
MCV: 92.7 fL (ref 78.0–100.0)
PLATELETS: 265 10*3/uL (ref 150–400)
RBC: 4.27 MIL/uL (ref 4.22–5.81)
RDW: 14.3 % (ref 11.5–15.5)
WBC: 8.6 10*3/uL (ref 4.0–10.5)

## 2015-10-09 LAB — COMPREHENSIVE METABOLIC PANEL
ALBUMIN: 4.2 g/dL (ref 3.5–5.0)
ALK PHOS: 102 U/L (ref 38–126)
ALT: 16 U/L — AB (ref 17–63)
ANION GAP: 9 (ref 5–15)
AST: 27 U/L (ref 15–41)
BILIRUBIN TOTAL: 0.4 mg/dL (ref 0.3–1.2)
BUN: 11 mg/dL (ref 6–20)
CALCIUM: 9.6 mg/dL (ref 8.9–10.3)
CO2: 24 mmol/L (ref 22–32)
CREATININE: 0.9 mg/dL (ref 0.61–1.24)
Chloride: 106 mmol/L (ref 101–111)
GFR calc non Af Amer: >60 mL/min (ref >60)
GLUCOSE: 87 mg/dL (ref 65–99)
Potassium: 4.2 mmol/L (ref 3.5–5.1)
Sodium: 139 mmol/L (ref 135–145)
TOTAL PROTEIN: 8 g/dL (ref 6.5–8.1)

## 2015-10-09 LAB — RAPID URINE DRUG SCREEN, HOSP PERFORMED
AMPHETAMINES: NOT DETECTED
BENZODIAZEPINES: NOT DETECTED
Barbiturates: NOT DETECTED
COCAINE: POSITIVE — AB
OPIATES: NOT DETECTED
TETRAHYDROCANNABINOL: POSITIVE — AB

## 2015-10-09 LAB — SALICYLATE LEVEL: Salicylate Lvl: <4 mg/dL (ref 2.8–30.0)

## 2015-10-09 LAB — ACETAMINOPHEN LEVEL

## 2015-10-09 LAB — ETHANOL: Alcohol, Ethyl (B): 6 mg/dL — ABNORMAL HIGH (ref <5)

## 2015-10-09 MED ORDER — ACETAMINOPHEN 325 MG PO TABS
650.0000 mg | ORAL_TABLET | Freq: Four times a day (QID) | ORAL | Status: DC | PRN
  Administered 2015-10-09 – 2015-10-12 (×6): 650 mg via ORAL
  Filled 2015-10-09 (×6): qty 2

## 2015-10-09 MED ORDER — NICOTINE 21 MG/24HR TD PT24
21.0000 mg | MEDICATED_PATCH | Freq: Every day | TRANSDERMAL | Status: DC
  Administered 2015-10-10 – 2015-10-12 (×3): 21 mg via TRANSDERMAL
  Filled 2015-10-09 (×6): qty 1

## 2015-10-09 MED ORDER — QUETIAPINE FUMARATE 200 MG PO TABS
200.0000 mg | ORAL_TABLET | Freq: Every day | ORAL | Status: DC
  Administered 2015-10-09 – 2015-10-11 (×3): 200 mg via ORAL
  Filled 2015-10-09 (×4): qty 1
  Filled 2015-10-09: qty 7
  Filled 2015-10-09: qty 1

## 2015-10-09 MED ORDER — DARUNAVIR-COBICISTAT 800-150 MG PO TABS
1.0000 | ORAL_TABLET | Freq: Every day | ORAL | Status: DC
  Administered 2015-10-10 – 2015-10-12 (×3): 1 via ORAL
  Filled 2015-10-09 (×5): qty 1

## 2015-10-09 MED ORDER — ABACAVIR-DOLUTEGRAVIR-LAMIVUD 600-50-300 MG PO TABS
1.0000 | ORAL_TABLET | Freq: Every day | ORAL | Status: DC
  Administered 2015-10-10 – 2015-10-12 (×3): 1 via ORAL
  Filled 2015-10-09 (×5): qty 1

## 2015-10-09 MED ORDER — QUETIAPINE FUMARATE 100 MG PO TABS: 200.0000 mg | ORAL_TABLET | Freq: Every day | ORAL | Status: DC

## 2015-10-09 MED ORDER — NICOTINE 21 MG/24HR TD PT24
21.0000 mg | MEDICATED_PATCH | Freq: Every day | TRANSDERMAL | Status: DC
  Administered 2015-10-09: 21 mg via TRANSDERMAL
  Filled 2015-10-09: qty 1

## 2015-10-09 MED ORDER — ALBUTEROL SULFATE HFA 108 (90 BASE) MCG/ACT IN AERS: 2.0000 | INHALATION_SPRAY | Freq: Four times a day (QID) | RESPIRATORY_TRACT | Status: DC | PRN

## 2015-10-09 MED ORDER — GABAPENTIN 300 MG PO CAPS
300.0000 mg | ORAL_CAPSULE | Freq: Three times a day (TID) | ORAL | Status: DC
  Administered 2015-10-09: 300 mg via ORAL
  Filled 2015-10-09: qty 1

## 2015-10-09 MED ORDER — ACETAMINOPHEN 325 MG PO TABS
650.0000 mg | ORAL_TABLET | ORAL | Status: DC | PRN
  Filled 2015-10-09: qty 2

## 2015-10-09 MED ORDER — MIRTAZAPINE 7.5 MG PO TABS
7.5000 mg | ORAL_TABLET | Freq: Every day | ORAL | Status: DC
  Administered 2015-10-09 – 2015-10-11 (×3): 7.5 mg via ORAL
  Filled 2015-10-09: qty 1
  Filled 2015-10-09: qty 7
  Filled 2015-10-09 (×4): qty 1

## 2015-10-09 MED ORDER — ABACAVIR-DOLUTEGRAVIR-LAMIVUD 600-50-300 MG PO TABS
1.0000 | ORAL_TABLET | Freq: Every day | ORAL | Status: DC
  Administered 2015-10-09: 1 via ORAL
  Filled 2015-10-09: qty 1

## 2015-10-09 MED ORDER — DARUNAVIR-COBICISTAT 800-150 MG PO TABS
1.0000 | ORAL_TABLET | Freq: Every day | ORAL | Status: DC
  Administered 2015-10-09: 1 via ORAL
  Filled 2015-10-09: qty 1

## 2015-10-09 MED ORDER — GABAPENTIN 300 MG PO CAPS
300.0000 mg | ORAL_CAPSULE | Freq: Three times a day (TID) | ORAL | Status: DC
  Administered 2015-10-10 – 2015-10-12 (×8): 300 mg via ORAL
  Filled 2015-10-09: qty 1
  Filled 2015-10-09: qty 21
  Filled 2015-10-09 (×3): qty 1
  Filled 2015-10-09: qty 21
  Filled 2015-10-09: qty 1
  Filled 2015-10-09: qty 21
  Filled 2015-10-09 (×5): qty 1

## 2015-10-09 MED ORDER — ACETAMINOPHEN 325 MG PO TABS: 650.0000 mg | ORAL_TABLET | ORAL | Status: DC | PRN

## 2015-10-09 MED ORDER — MAGNESIUM HYDROXIDE 400 MG/5ML PO SUSP: 30.0000 mL | Freq: Every day | ORAL | Status: DC | PRN

## 2015-10-09 MED ORDER — ALUM & MAG HYDROXIDE-SIMETH 200-200-20 MG/5ML PO SUSP: 30.0000 mL | ORAL | Status: DC | PRN

## 2015-10-09 MED ORDER — ACETAMINOPHEN 325 MG PO TABS
650.0000 mg | ORAL_TABLET | ORAL | Status: DC | PRN
  Administered 2015-10-09: 650 mg via ORAL

## 2015-10-09 MED ORDER — MIRTAZAPINE 7.5 MG PO TABS
7.5000 mg | ORAL_TABLET | Freq: Every day | ORAL | Status: DC
  Filled 2015-10-09: qty 1

## 2015-10-09 NOTE — ED Notes (Signed)
Pt behavior calm and cooperative. Pt endorsing suicidal ideation. Denies AVH. Denies HI. Pt reports a recent break up with significant other as a stressor. Special checks q 15 mins in place for safety. Video monitoring in place.

## 2015-10-09 NOTE — ED Notes (Signed)
Pt reports SI with plan to walk in traffic. Pt states he has stopped taking all meds recently d/t depression over a break up.

## 2015-10-09 NOTE — ED Notes (Signed)
Pt AAO x 3, no distress noted, calm & cooperative, monitoring for safety, Q 15 min checks in effect.  [facility] and [facility] [facility].

## 2015-10-09 NOTE — ED Notes (Signed)
[id number] Expected date:  Expected time:  Means of arrival:  Comments: T5

## 2015-10-09 NOTE — Consult Note (Signed)
[facility] Face-to-Face Psychiatry Consult   Reason for Consult:  Suicidal ideation, Depression, Anger Referring Physician:  EDP Patient Identification: Kurt Foley MRN:  [id number] Principal Diagnosis: Severe recurrent major depression without psychotic features [facility]) Diagnosis:   Patient Active Problem List   Diagnosis Date Noted  . Cannabis use disorder, severe, dependence ([place]) [F12.20] 04/09/2015    Priority: High  . Severe recurrent major depression without psychotic features ([place]) [F33.2] 04/08/2015    Priority: High  . Substance induced mood disorder ([place]) [F19.94] 09/16/2015  . MDD (major depressive disorder), recurrent episode, moderate ([place]) [F33.1] 09/16/2015  . Cocaine dependence with cocaine-induced mood disorder ([place]) [F14.24]   . Cocaine dependence ([place]) [F14.20] 04/08/2015  . Alcohol abuse [F10.10] 04/08/2015  . Screening examination for venereal disease [Z11.3] 03/25/2015  . Encounter for long-term (current) use of medications [Z79.899] 03/25/2015  . 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 AIDS ([place]) [B20]   . Tobacco abuse [Z72.0]   . Decreased appetite [R63.0]   . 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 CAP (community acquired pneumonia) [J18.9] 03/20/2015  . Leukocytosis [D72.829] 03/20/2015  . Contusion of right knee [S80.01XA] 03/20/2015  . Fall [W19.XXXA] 03/20/2015  . Loss of weight [R63.4] 02/12/2014  . Visual disturbance [H53.9] 11/18/2013  . Human immunodeficiency virus (HIV) disease ([place]) [B20] 10/30/2013  . Atopic dermatitis [L20.9] 10/30/2013    Total Time spent with patient: 45 minutes  Subjective:   Kurt Foley is a 40 y.o. male patient admitted with  Suicidal ideation, Depression, Anger  HPI:  AA male, 40 years old was evaluated today for increased feelings of depression and suicide ideation.  Patient states that he is not able to function since the end of his relationship with  his boyfriend.  Patient was tearful through out the interview.  He stopped taking his medications including his HIV medication and have been self medicating with Cocaine and Marijuana.  Patient is still suicidal with plans to jump into traffic.   Patient reports feeling hopeless and helpless.  He reports poor sleep and appetite.  He has been accepted for admission and he has been assigned a bed.  Past Psychiatric History:  Major depressive disorder, Cannabis use disorder, Cocaine use disorder  Risk to Self: Suicidal Ideation: Yes-Currently Present Suicidal Intent: Yes-Currently Present Is patient at risk for suicide?: Yes Suicidal Plan?: Yes-Currently Present Specify Current Suicidal Plan: "walk out in front of a car".  Access to Means: Yes Specify Access to Suicidal Means: traffic  What has been your use of drugs/alcohol within the last 12 months?: THC, cocaine and alcohol use reported.  How many times?: 2 Other Self Harm Risks: Pt denies  Triggers for Past Attempts: Other (Comment) (breakups and relationships) Intentional Self Injurious Behavior: None Risk to Others: Homicidal Ideation: No Thoughts of Harm to Others: No Comment - Thoughts of Harm to Others: Pt deneis  Current Homicidal Intent: No Current Homicidal Plan: No Describe Current Homicidal Plan: Pt denies  Access to Homicidal Means: No Identified Victim: Pt denies History of harm to others?: No Assessment of Violence: None Noted Violent Behavior Description: No violent behaviors noted.  Does patient have access to weapons?: No Criminal Charges Pending?: Yes Describe Pending Criminal Charges: Misdemeanor larceny  Does patient have a court date: Yes Court Date: 10/18/15 Prior Inpatient Therapy: Prior Inpatient Therapy: Yes Prior Therapy Dates: 03/2015 Prior Therapy Facilty/Provider(s): [facility]  Reason for Treatment: Depression  Prior Outpatient Therapy: Prior  Outpatient Therapy: No Prior Therapy Dates:  (n/a) Prior  Therapy Facilty/Provider(s):  (n/a) Reason for Treatment:  (n/a) Does patient have an ACCT team?: No Does patient have Intensive In-House Services?  : No Does patient have [facility] services? : No Does patient have P4CC services?: No  Past Medical History:  Past Medical History  Diagnosis Date  . HIV infection ([place])   . Depression    History reviewed. No pertinent past surgical history. Family History:  Family History  Problem Relation Age of Onset  . Diabetes Mother   . Hypertension Mother   . Arthritis Mother    Family Psychiatric  History: unknown Social History:  History  Alcohol Use  . 1.2 oz/week  . 2 Standard drinks or equivalent per week    Comment: 3-4 days a week.Last drink: today     History  Drug Use  . Yes  . Special: Marijuana, Cocaine    Comment: Once a week. Last used yesterday.  Cocaine last used today.     Social History   Social History  . Marital Status: Married    Spouse Name: N/A  . Number of Children: N/A  . Years of Education: N/A   Social History Main Topics  . Smoking status: Current Every Day Smoker -- 0.50 packs/day for 25 years    Types: Cigarettes  . Smokeless tobacco: Never Used     Comment: cutting back  . Alcohol Use: 1.2 oz/week    2 Standard drinks or equivalent per week     Comment: 3-4 days a week.Last drink: today  . Drug Use: Yes    Special: Marijuana, Cocaine     Comment: Once a week. Last used yesterday.  Cocaine last used today.   Marland Kitchen Sexual Activity: Not Asked     Comment: encouraged condom use   Other Topics Concern  . None   Social History Narrative   Additional Social History:    Allergies:  No Known Allergies  Labs:  Results for orders placed or performed during the hospital encounter of 10/09/15 (from the past 48 hour(s))  Comprehensive metabolic panel     Status: Abnormal   Collection Time: 10/09/15  1:00 AM  Result Value Ref Range   Sodium 139 135 - 145 mmol/L   Potassium 4.2 3.5 - 5.1 mmol/L    Chloride 106 101 - 111 mmol/L   CO2 24 22 - 32 mmol/L   Glucose, Bld 87 65 - 99 mg/dL   BUN 11 6 - 20 mg/dL   Creatinine, Ser 0.90 0.61 - 1.24 mg/dL   Calcium 9.6 8.9 - 10.3 mg/dL   Total Protein 8.0 6.5 - 8.1 g/dL   Albumin 4.2 3.5 - 5.0 g/dL   AST 27 15 - 41 U/L   ALT 16 (L) 17 - 63 U/L   Alkaline Phosphatase 102 38 - 126 U/L   Total Bilirubin 0.4 0.3 - 1.2 mg/dL   GFR calc non Af Amer >60 >60 mL/min   GFR calc Af Amer >60 >60 mL/min    Comment: (NOTE) The eGFR has been calculated using the CKD EPI equation. This calculation has not been validated in all clinical situations. eGFR's persistently <60 mL/min signify possible Chronic Kidney Disease.    Anion gap 9 5 - 15  Ethanol     Status: Abnormal   Collection Time: 10/09/15  1:00 AM  Result Value Ref Range   Alcohol, Ethyl (B) 6 (H) <5 mg/dL    Comment:  LOWEST DETECTABLE LIMIT FOR SERUM ALCOHOL IS 5 mg/dL FOR MEDICAL PURPOSES ONLY   Salicylate level     Status: None   Collection Time: 10/09/15  1:00 AM  Result Value Ref Range   Salicylate Lvl <4.0 2.8 - 30.0 mg/dL  Acetaminophen level     Status: Abnormal   Collection Time: 10/09/15  1:00 AM  Result Value Ref Range   Acetaminophen (Tylenol), Serum <10 (L) 10 - 30 ug/mL    Comment:        THERAPEUTIC CONCENTRATIONS VARY SIGNIFICANTLY. A RANGE OF 10-30 ug/mL MAY BE AN EFFECTIVE CONCENTRATION FOR MANY PATIENTS. HOWEVER, SOME ARE BEST TREATED AT CONCENTRATIONS OUTSIDE THIS RANGE. ACETAMINOPHEN CONCENTRATIONS >150 ug/mL AT 4 HOURS AFTER INGESTION AND >50 ug/mL AT 12 HOURS AFTER INGESTION ARE OFTEN ASSOCIATED WITH TOXIC REACTIONS.   cbc     Status: None   Collection Time: 10/09/15  1:00 AM  Result Value Ref Range   WBC 8.6 4.0 - 10.5 K/uL   RBC 4.27 4.22 - 5.81 MIL/uL   Hemoglobin 13.3 13.0 - 17.0 g/dL   HCT 76.8 08.8 - 11.0 %   MCV 92.7 78.0 - 100.0 fL   MCH 31.1 26.0 - 34.0 pg   MCHC 33.6 30.0 - 36.0 g/dL   RDW 31.5 94.5 - 85.9 %   Platelets 265  150 - 400 K/uL  Rapid urine drug screen (hospital performed)     Status: Abnormal   Collection Time: 10/09/15  9:30 AM  Result Value Ref Range   Opiates NONE DETECTED NONE DETECTED   Cocaine POSITIVE (A) NONE DETECTED   Benzodiazepines NONE DETECTED NONE DETECTED   Amphetamines NONE DETECTED NONE DETECTED   Tetrahydrocannabinol POSITIVE (A) NONE DETECTED   Barbiturates NONE DETECTED NONE DETECTED    Comment:        DRUG SCREEN FOR MEDICAL PURPOSES ONLY.  IF CONFIRMATION IS NEEDED FOR ANY PURPOSE, NOTIFY LAB WITHIN 5 DAYS.        LOWEST DETECTABLE LIMITS FOR URINE DRUG SCREEN Drug Class       Cutoff (ng/mL) Amphetamine      1000 Barbiturate      200 Benzodiazepine   200 Tricyclics       300 Opiates          300 Cocaine          300 THC              50     Current Facility-Administered Medications  Medication Dose Route Frequency Provider Last Rate Last Dose  . abacavir-dolutegravir-lamiVUDine (TRIUMEQ) 600-50-300 MG per tablet 1 tablet  1 tablet Oral Daily Earney Navy, NP   1 tablet at 10/09/15 1404  . acetaminophen (TYLENOL) tablet 650 mg  650 mg Oral Q4H PRN Elpidio Anis, PA-C      . acetaminophen (TYLENOL) tablet 650 mg  650 mg Oral Q4H PRN Elpidio Anis, PA-C   650 mg at 10/09/15 0615  . darunavir-cobicistat (PREZCOBIX) 800-150 MG per tablet 1 tablet  1 tablet Oral Daily Earney Navy, NP   1 tablet at 10/09/15 1404  . nicotine (NICODERM CQ - dosed in mg/24 hours) patch 21 mg  21 mg Transdermal Daily Elpidio Anis, PA-C   21 mg at 10/09/15 2924   Current Outpatient Prescriptions  Medication Sig Dispense Refill  . abacavir-dolutegravir-lamiVUDine (TRIUMEQ) 600-50-300 MG tablet Take 1 tablet by mouth daily. 1 tablet 0  . darunavir-cobicistat (PREZCOBIX) 800-150 MG tablet TAKE 1 TABLET BY MOUTH DAILY. SWALLOW WHOLE.  DO NOT CRUSH, BREAK OR CHEW TABLETS. TAKE WITH FOOD (Patient taking differently: Take 1 tablet by mouth daily. SWALLOW WHOLE. DO NOT CRUSH, BREAK OR  CHEW TABLETS) 1 tablet 0  . gabapentin (NEURONTIN) 300 MG capsule Take 1 capsule (300 mg total) by mouth 4 (four) times daily -  before meals and at bedtime. 60 capsule 0  . mirtazapine (REMERON) 7.5 MG tablet Take 1 tablet (7.5 mg total) by mouth at bedtime. 14 tablet 0  . QUEtiapine (SEROQUEL) 200 MG tablet Take 1 tablet (200 mg total) by mouth at bedtime. 14 tablet 0  . albuterol (PROVENTIL HFA;VENTOLIN HFA) 108 (90 Base) MCG/ACT inhaler Inhale 2 puffs into the lungs every 2 (two) hours as needed for wheezing or shortness of breath (cough). (Patient not taking: Reported on 10/09/2015) 1 Inhaler 0  . nicotine (NICODERM CQ - DOSED IN MG/24 HOURS) 21 mg/24hr patch Place 1 patch (21 mg total) onto the skin daily at 6 (six) AM. (Patient not taking: Reported on 10/09/2015) 28 patch 0    Musculoskeletal: Strength & Muscle Tone: within normal limits Gait & Station: normal Patient leans: N/A  Psychiatric Specialty Exam: Review of Systems  Constitutional: Negative.   HENT: Negative.   Eyes: Negative.   Respiratory: Negative.   Cardiovascular: Negative.   Gastrointestinal: Negative.   Genitourinary: Negative.   Musculoskeletal: Negative.   Skin: Negative.   Neurological: Negative.   Endo/Heme/Allergies: Negative.     Blood pressure 119/99, pulse 79, temperature 98 F (36.7 C), temperature source Oral, resp. rate 16, SpO2 100 %.There is no weight on file to calculate BMI.  General Appearance: Casual and Disheveled  Eye Contact::  Minimal  Speech:  Clear and Coherent and Normal Rate  Volume:  Normal  Mood:  Angry, Anxious, Depressed and Hopeless  Affect:  Congruent, Depressed, Flat and Tearful  Thought Process:  Coherent, Goal Directed and Intact  Orientation:  Full (Time, Place, and Person)  Thought Content:  WDL  Suicidal Thoughts:  Yes.  with intent/plan  Homicidal Thoughts:  No  Memory:  Immediate;   Good Recent;   Good Remote;   Good  Judgement:  Fair  Insight:  Shallow   Psychomotor Activity:  Psychomotor Retardation  Concentration:  Good  Recall:  NA  Fund of Knowledge:Fair  Language: Good  Akathisia:  No  Handed:  Right  AIMS (if indicated):     Assets:  Desire for Improvement  ADL's:  Impaired  Cognition: WNL  Sleep:      Treatment Plan Summary: Daily contact with patient to assess and evaluate symptoms and progress in treatment and Medication management  Disposition: Accepted for admission and he has a bed assigned to him.  We have resumed his home medications.  Delfin Gant, NP   PMHNP-BC 10/09/2015 5:26 PM Patient case discussed and seen in rounds with NP  Agree with note as above
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
## 2015-10-09 NOTE — ED Notes (Signed)
[facility]  [facility] [place] [facility].

## 2015-10-09 NOTE — Clinical Social Work Note (Signed)
[facility] has a bed for pt today 407 bed 2 but cannot take pt until 8pm this evening.  Elray Buba.Emerald Shor, LCSW [facility] Clinical Social Worker - Weekend Coverage cell #: [phone]

## 2015-10-09 NOTE — ED Provider Notes (Signed)
CSN: [id number]     Arrival date & time 10/08/15  2348 History   None    Chief Complaint  Patient presents with  . Suicidal     (Consider location/radiation/quality/duration/timing/severity/associated sxs/prior Treatment) HPI Comments: Patient with a history of alcohol abuse, HIV and depression presents with severe depression since a recent break up with a significant other. He states he stopped taking all his medications 3 days ago "because I don't care anymore". He denies self harm prior to arrival. He also reports auditory hallucinations comprised of voices that are command voices telling him to hurt himself.   The history is provided by the patient. No language interpreter was used.    Past Medical History  Diagnosis Date  . HIV infection (HCC)   . Depression    History reviewed. No pertinent past surgical history. Family History  Problem Relation Age of Onset  . Diabetes Mother   . Hypertension Mother   . Arthritis Mother    Social History  Substance Use Topics  . Smoking status: Current Every Day Smoker -- 0.50 packs/day for 25 years    Types: Cigarettes  . Smokeless tobacco: Never Used     Comment: cutting back  . Alcohol Use: 1.2 oz/week    2 Standard drinks or equivalent per week     Comment: 3-4 days a week.Last drink: today    Review of Systems  Constitutional: Negative for fever and chills.  HENT: Negative.   Respiratory: Negative.   Cardiovascular: Negative.   Gastrointestinal: Negative.   Musculoskeletal: Negative.   Skin: Negative.   Neurological: Negative.   Psychiatric/Behavioral: Positive for suicidal ideas, hallucinations and dysphoric mood. Negative for self-injury.      Allergies  Review of patient's allergies indicates no known allergies.  Home Medications   Prior to Admission medications   Medication Sig Start Date End Date Taking? Authorizing Provider  abacavir-dolutegravir-lamiVUDine (TRIUMEQ) 600-50-300 MG tablet Take 1 tablet by  mouth daily. 09/16/15  Yes John Salena Saner Withrow, FNP  darunavir-cobicistat (PREZCOBIX) 800-150 MG tablet TAKE 1 TABLET BY MOUTH DAILY. SWALLOW WHOLE. DO NOT CRUSH, BREAK OR CHEW TABLETS. TAKE WITH FOOD Patient taking differently: Take 1 tablet by mouth daily. SWALLOW WHOLE. DO NOT CRUSH, BREAK OR CHEW TABLETS 04/14/15  Yes Beau FannyJohn C Withrow, FNP  gabapentin (NEURONTIN) 300 MG capsule Take 1 capsule (300 mg total) by mouth 4 (four) times daily -  before meals and at bedtime. 09/16/15  Yes Beau FannyJohn C Withrow, FNP  mirtazapine (REMERON) 7.5 MG tablet Take 1 tablet (7.5 mg total) by mouth at bedtime. 09/16/15  Yes Beau FannyJohn C Withrow, FNP  QUEtiapine (SEROQUEL) 200 MG tablet Take 1 tablet (200 mg total) by mouth at bedtime. 09/16/15  Yes Beau FannyJohn C Withrow, FNP  albuterol (PROVENTIL HFA;VENTOLIN HFA) 108 (90 Base) MCG/ACT inhaler Inhale 2 puffs into the lungs every 2 (two) hours as needed for wheezing or shortness of breath (cough). Patient not taking: Reported on 10/09/2015 08/10/15   Arby BarretteMarcy Pfeiffer, MD  nicotine (NICODERM CQ - DOSED IN MG/24 HOURS) 21 mg/24hr patch Place 1 patch (21 mg total) onto the skin daily at 6 (six) AM. Patient not taking: Reported on 10/09/2015 09/16/15   Everardo AllJohn C Withrow, FNP   BP 135/85 mmHg  Pulse 88  Temp(Src) 97.9 F (36.6 C) (Oral)  Resp 20  SpO2 99% Physical Exam  Constitutional: He is oriented to person, place, and time. He appears well-developed and well-nourished.  Neck: Normal range of motion.  Pulmonary/Chest: Effort normal.  Musculoskeletal:  Normal range of motion.  Neurological: He is alert and oriented to person, place, and time.  Skin: Skin is warm and dry.  Psychiatric: His speech is normal. He is actively hallucinating. He exhibits a depressed mood. He expresses suicidal ideation.    ED Course  Procedures (including critical care time) Labs Review Labs Reviewed  COMPREHENSIVE METABOLIC PANEL - Abnormal; Notable for the following:    ALT 16 (*)    All other components within normal  limits  ETHANOL - Abnormal; Notable for the following:    Alcohol, Ethyl (B) 6 (*)    All other components within normal limits  ACETAMINOPHEN LEVEL - Abnormal; Notable for the following:    Acetaminophen (Tylenol), Serum <10 (*)    All other components within normal limits  SALICYLATE LEVEL  CBC  URINE RAPID DRUG SCREEN, HOSP PERFORMED    Imaging Review No results found. I have personally reviewed and evaluated these images and lab results as part of my medical decision-making.   EKG Interpretation None      MDM   Final diagnoses:  None    1. Depression 2. Suicidal ideation 3. Medication noncompliance  The patient appears well, non-toxic. He does not appear significantly intoxicated. He endorses SI and auditory hallucinations. TTS consultation to determine disposition.    Elpidio Anis, PA-C 10/09/15 0501  Zadie Rhine, MD 10/09/15 0800

## 2015-10-09 NOTE — [facility] Counselor (Signed)
Per [name] [facility] at [facility], pt has been accepted to [facility] and can be transferred after 8 pm.  Kurt Foley Carlucci Paige Stephaine Breshears, LCSWA Therapeutic Triage Specialist  
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
## 2015-10-09 NOTE — [facility] Assessment (Signed)
Assessment completed. Consulted Maryjean Mornharles Kober, PA-C who recommended inpatient treatment. TTS to seek placement. Informed Misty StanleyLisa, PA-C of the recommendation.

## 2015-10-10 ENCOUNTER — Encounter (HOSPITAL_COMMUNITY): Payer: Self-pay

## 2015-10-10 DIAGNOSIS — F332 Major depressive disorder, recurrent severe without psychotic features: Principal | ICD-10-CM

## 2015-10-10 MED ORDER — ENSURE ENLIVE PO LIQD
237.0000 mL | Freq: Three times a day (TID) | ORAL | Status: DC
  Administered 2015-10-10 – 2015-10-12 (×6): 237 mL via ORAL
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
## 2015-10-10 NOTE — Tx Team (Signed)
Initial Interdisciplinary Treatment Plan   PATIENT STRESSORS: Financial difficulties Legal issue Loss of significant other 3/4 days ago Substance abuse   PATIENT STRENGTHS: Ability for insight Motivation for treatment/growth   PROBLEM LIST: Problem List/Patient Goals Date to be addressed Date deferred Reason deferred Estimated date of resolution  Depression 10/09/15     SI 10/09/15     Substance abuse 10/09/15                                          DISCHARGE CRITERIA:  Adequate post-discharge living arrangements Improved stabilization in mood, thinking, and/or behavior  PRELIMINARY DISCHARGE PLAN: Return to previous living arrangement  PATIENT/FAMIILY INVOLVEMENT: This treatment plan has been presented to and reviewed with the patient, Kurt Foley, and/or family member.  The patient and family have been given the opportunity to ask questions and make suggestions.  Floyce StakesGarrison, Zalman Hull 10/10/2015, 4:09 AM

## 2015-10-10 NOTE — Plan of Care (Signed)
Problem: Ineffective individual coping Goal: STG: Patient will remain free from self harm Outcome: Progressing Pt safe on the unit at this time     

## 2015-10-10 NOTE — Progress Notes (Signed)
D: Pt denies SI/HI/AVH. Pt is pleasant and cooperative. Pt stated he was doing ok, a little better this evening.  A: Pt was offered support and encouragement. Pt was given scheduled medications. Pt was encourage to attend groups. Q 15 minute checks were done for safety.   R:Pt attends groups and interacts well with peers and staff. Pt is taking medication. Pt has no complaints.Pt receptive to treatment and safety maintained on unit.

## 2015-10-10 NOTE — Progress Notes (Signed)
Patient admitted voluntarily for suicidal ideations. He reports that he had a recent break up with his significant other and has become more depressed. He has been off his psychiatric and HIV medications. He was pleasant and cooperative during admission. Patient oriented to unit, safety maintained on unit with 15 min checks.

## 2015-10-10 NOTE — H&P (Signed)
Psychiatric Admission Assessment Adult  Patient Identification: Kurt Foley MRN:  [id number] Date of Evaluation:  10/10/2015 Chief Complaint:  MDD DISORDER RECURRENT EPISODE MODERATE POLYSUBSTANCE ABUSE Principal Diagnosis: MDD (major depressive disorder), recurrent episode, severe ([place]) Diagnosis:   Patient Active Problem List   Diagnosis Date Noted  . MDD (major depressive disorder), recurrent episode, severe ([place]) [F33.2] 10/09/2015  . Substance induced mood disorder ([place]) [F19.94] 09/16/2015  . MDD (major depressive disorder), recurrent episode, moderate ([place]) [F33.1] 09/16/2015  . Cocaine dependence with cocaine-induced mood disorder ([place]) [F14.24]   . Cannabis use disorder, severe, dependence ([place]) [F12.20] 04/09/2015  . Severe recurrent major depression without psychotic features ([place]) [F33.2] 04/08/2015  . Cocaine dependence ([place]) [F14.20] 04/08/2015  . Alcohol abuse [F10.10] 04/08/2015  . Screening examination for venereal disease [Z11.3] 03/25/2015  . Encounter for long-term (current) use of medications [Z79.899] 03/25/2015  . 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 AIDS ([place]) [B20]   . Tobacco abuse [Z72.0]   . Decreased appetite [R63.0]   . 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 CAP (community acquired pneumonia) [J18.9] 03/20/2015  . Leukocytosis [D72.829] 03/20/2015  . Contusion of right knee [S80.01XA] 03/20/2015  . Fall [W19.XXXA] 03/20/2015  . Loss of weight [R63.4] 02/12/2014  . Visual disturbance [H53.9] 11/18/2013  . Human immunodeficiency virus (HIV) disease ([place]) [B20] 10/30/2013  . Atopic dermatitis [L20.9] 10/30/2013   History of Present Illness:: Kurt Foley 40 yr old black male admitted to [facility] after presenting to ED with complaints of worsening depression over the last 4 days.  Patient seen by this provider; and chart reviewed.  On evaluation:  BETTY BROOKS reports that he and  his boyfriend broke up and after the break up he went on a bender with alcohol, mariajuana, and cocaine.  States that he started to have "these bad thoughts; I was hearing voices telling me to walk out in front of car.  So, I came to the hospital."  Patient states that he was inpatient 3 months ago and was given prescription for medication and to follow up at [facility].  States that he went to [facility] once but did not start his medication because they did not give any assistance and I could afford the medicine."  Patient states that he had only been taking his medication 2-3 weeks prior to the 4 days that he was off of his medication.    Associated Signs/Symptoms: Depression Symptoms:  depressed mood, insomnia, suicidal thoughts with specific plan, anxiety, decreased appetite, (Hypo) Manic Symptoms:  Distractibility, Impulsivity, Irritable Mood, Anxiety Symptoms:  Excessive Worry, Psychotic Symptoms:  Hallucinations: Auditory Command:  telling him to walke in front of a car Paranoia, PTSD Symptoms: Denies Total Time spent with patient: 45 minutes  Past Psychiatric History: Major depressive disorder, Cannabis use disorder, Cocaine use disorder  Is the patient at risk to self? Yes.    Has the patient been a risk to self in the past 6 months? No.  Has the patient been a risk to self within the distant past? No.  Is the patient a risk to others? No.  Has the patient been a risk to others in the past 6 months? No.  Has the patient been a risk to others within the distant past? No.   Prior Inpatient Therapy:   Prior Outpatient Therapy:    Alcohol Screening: 1. How often do you have a drink containing alcohol?: 4 or more times a  week 2. How many drinks containing alcohol do you have on a typical day when you are drinking?: 1 or 2 3. How often do you have six or more drinks on one occasion?: Monthly Preliminary Score: 2 4. How often during the last year have you found that you were not able  to stop drinking once you had started?: Never 5. How often during the last year have you failed to do what was normally expected from you becasue of drinking?: Weekly 6. How often during the last year have you needed a first drink in the morning to get yourself going after a heavy drinking session?: Monthly 7. How often during the last year have you had a feeling of guilt of remorse after drinking?: Weekly 8. How often during the last year have you been unable to remember what happened the night before because you had been drinking?: Monthly 9. Have you or someone else been injured as a result of your drinking?: No 10. Has a relative or friend or a doctor or another health worker been concerned about your drinking or suggested you cut down?: Yes, during the last year Alcohol Use Disorder Identification Test Final Score (AUDIT): 20 Brief Intervention: Yes Substance Abuse History in the last 12 months:  Yes.   Consequences of Substance Abuse: Denies Previous Psychotropic Medications: Yes  Psychological Evaluations: No  Past Medical History:  Past Medical History  Diagnosis Date  . HIV infection ([place])   . Depression    History reviewed. No pertinent past surgical history. Family History:  Family History  Problem Relation Age of Onset  . Diabetes Mother   . Hypertension Mother   . Arthritis Mother    Family Psychiatric  History: Unaware Tobacco Screening: Current every day smoker Social History:  History  Alcohol Use  . 1.2 oz/week  . 2 Standard drinks or equivalent per week    Comment: 3-4 days a week.Last drink: today     History  Drug Use  . Yes  . Special: Marijuana, Cocaine    Comment: Once a week. Last used yesterday.  Cocaine last used today.     Additional Social History:  Allergies:  No Known Allergies Lab Results:  Results for orders placed or performed during the hospital encounter of 10/09/15 (from the past 48 hour(s))  Comprehensive metabolic panel     Status:  Abnormal   Collection Time: 10/09/15  1:00 AM  Result Value Ref Range   Sodium 139 135 - 145 mmol/L   Potassium 4.2 3.5 - 5.1 mmol/L   Chloride 106 101 - 111 mmol/L   CO2 24 22 - 32 mmol/L   Glucose, Bld 87 65 - 99 mg/dL   BUN 11 6 - 20 mg/dL   Creatinine, Ser 0.90 0.61 - 1.24 mg/dL   Calcium 9.6 8.9 - 10.3 mg/dL   Total Protein 8.0 6.5 - 8.1 g/dL   Albumin 4.2 3.5 - 5.0 g/dL   AST 27 15 - 41 U/L   ALT 16 (L) 17 - 63 U/L   Alkaline Phosphatase 102 38 - 126 U/L   Total Bilirubin 0.4 0.3 - 1.2 mg/dL   GFR calc non Af Amer >60 >60 mL/min   GFR calc Af Amer >60 >60 mL/min    Comment: (NOTE) The eGFR has been calculated using the CKD EPI equation. This calculation has not been validated in all clinical situations. eGFR's persistently <60 mL/min signify possible Chronic Kidney Disease.    Anion gap 9 5 - 15  Ethanol     Status: Abnormal   Collection Time: 10/09/15  1:00 AM  Result Value Ref Range   Alcohol, Ethyl (B) 6 (H) <5 mg/dL    Comment:        LOWEST DETECTABLE LIMIT FOR SERUM ALCOHOL IS 5 mg/dL FOR MEDICAL PURPOSES ONLY   Salicylate level     Status: None   Collection Time: 10/09/15  1:00 AM  Result Value Ref Range   Salicylate Lvl <2.7 2.8 - 30.0 mg/dL  Acetaminophen level     Status: Abnormal   Collection Time: 10/09/15  1:00 AM  Result Value Ref Range   Acetaminophen (Tylenol), Serum <10 (L) 10 - 30 ug/mL    Comment:        THERAPEUTIC CONCENTRATIONS VARY SIGNIFICANTLY. A RANGE OF 10-30 ug/mL MAY BE AN EFFECTIVE CONCENTRATION FOR MANY PATIENTS. HOWEVER, SOME ARE BEST TREATED AT CONCENTRATIONS OUTSIDE THIS RANGE. ACETAMINOPHEN CONCENTRATIONS >150 ug/mL AT 4 HOURS AFTER INGESTION AND >50 ug/mL AT 12 HOURS AFTER INGESTION ARE OFTEN ASSOCIATED WITH TOXIC REACTIONS.   cbc     Status: None   Collection Time: 10/09/15  1:00 AM  Result Value Ref Range   WBC 8.6 4.0 - 10.5 K/uL   RBC 4.27 4.22 - 5.81 MIL/uL   Hemoglobin 13.3 13.0 - 17.0 g/dL   HCT 39.6 39.0  - 52.0 %   MCV 92.7 78.0 - 100.0 fL   MCH 31.1 26.0 - 34.0 pg   MCHC 33.6 30.0 - 36.0 g/dL   RDW 14.3 11.5 - 15.5 %   Platelets 265 150 - 400 K/uL  Rapid urine drug screen (hospital performed)     Status: Abnormal   Collection Time: 10/09/15  9:30 AM  Result Value Ref Range   Opiates NONE DETECTED NONE DETECTED   Cocaine POSITIVE (A) NONE DETECTED   Benzodiazepines NONE DETECTED NONE DETECTED   Amphetamines NONE DETECTED NONE DETECTED   Tetrahydrocannabinol POSITIVE (A) NONE DETECTED   Barbiturates NONE DETECTED NONE DETECTED    Comment:        DRUG SCREEN FOR MEDICAL PURPOSES ONLY.  IF CONFIRMATION IS NEEDED FOR ANY PURPOSE, NOTIFY LAB WITHIN 5 DAYS.        LOWEST DETECTABLE LIMITS FOR URINE DRUG SCREEN Drug Class       Cutoff (ng/mL) Amphetamine      1000 Barbiturate      200 Benzodiazepine   253 Tricyclics       664 Opiates          300 Cocaine          300 THC              50     Blood Alcohol level:  Lab Results  Component Value Date   ETH 6* 10/09/2015   ETH <5 40/34/7425    Metabolic Disorder Labs:  No results found for: HGBA1C, MPG No results found for: PROLACTIN Lab Results  Component Value Date   CHOL 200 07/29/2015   TRIG 169* 07/29/2015   HDL 48 07/29/2015   CHOLHDL 4.2 07/29/2015   VLDL 34* 07/29/2015   LDLCALC 118 07/29/2015   LDLCALC 74 10/30/2013    Current Medications: Current Facility-Administered Medications  Medication Dose Route Frequency Provider Last Rate Last Dose  . abacavir-dolutegravir-lamiVUDine (TRIUMEQ) 600-50-300 MG per tablet 1 tablet  1 tablet Oral Daily Delfin Gant, NP   1 tablet at 10/10/15 0835  . acetaminophen (TYLENOL) tablet 650 mg  650 mg Oral Q6H PRN Reginold Agent  Sharlene Motts, NP   650 mg at 10/10/15 0835  . albuterol (PROVENTIL HFA;VENTOLIN HFA) 108 (90 Base) MCG/ACT inhaler 2 puff  2 puff Inhalation Q6H PRN Delfin Gant, NP      . alum & mag hydroxide-simeth (MAALOX/MYLANTA) 200-200-20 MG/5ML suspension 30  mL  30 mL Oral Q4H PRN Delfin Gant, NP      . darunavir-cobicistat (PREZCOBIX) 800-150 MG per tablet 1 tablet  1 tablet Oral Daily Delfin Gant, NP   1 tablet at 10/10/15 0835  . feeding supplement (ENSURE ENLIVE) (ENSURE ENLIVE) liquid 237 mL  237 mL Oral TID BM Shuvon B Rankin, NP   237 mL at 10/10/15 1047  . gabapentin (NEURONTIN) capsule 300 mg  300 mg Oral TID Delfin Gant, NP   300 mg at 10/10/15 1211  . magnesium hydroxide (MILK OF MAGNESIA) suspension 30 mL  30 mL Oral Daily PRN Delfin Gant, NP      . mirtazapine (REMERON) tablet 7.5 mg  7.5 mg Oral QHS Delfin Gant, NP   7.5 mg at 10/09/15 2212  . nicotine (NICODERM CQ - dosed in mg/24 hours) patch 21 mg  21 mg Transdermal Daily Delfin Gant, NP   21 mg at 10/10/15 0834  . QUEtiapine (SEROQUEL) tablet 200 mg  200 mg Oral QHS Delfin Gant, NP   200 mg at 10/09/15 2212   PTA Medications: Prescriptions prior to admission  Medication Sig Dispense Refill Last Dose  . abacavir-dolutegravir-lamiVUDine (TRIUMEQ) 600-50-300 MG tablet Take 1 tablet by mouth daily. 1 tablet 0 10/09/2015  . albuterol (PROVENTIL HFA;VENTOLIN HFA) 108 (90 Base) MCG/ACT inhaler Inhale 2 puffs into the lungs every 2 (two) hours as needed for wheezing or shortness of breath (cough). (Patient not taking: Reported on 10/09/2015) 1 Inhaler 0 Unknown at Unknown time  . darunavir-cobicistat (PREZCOBIX) 800-150 MG tablet TAKE 1 TABLET BY MOUTH DAILY. SWALLOW WHOLE. DO NOT CRUSH, BREAK OR CHEW TABLETS. TAKE WITH FOOD (Patient taking differently: Take 1 tablet by mouth daily. SWALLOW WHOLE. DO NOT CRUSH, BREAK OR CHEW TABLETS) 1 tablet 0 10/09/2015  . gabapentin (NEURONTIN) 300 MG capsule Take 1 capsule (300 mg total) by mouth 4 (four) times daily -  before meals and at bedtime. 60 capsule 0 10/09/2015  . mirtazapine (REMERON) 7.5 MG tablet Take 1 tablet (7.5 mg total) by mouth at bedtime. 14 tablet 0 Unknown at Unknown time  . nicotine  (NICODERM CQ - DOSED IN MG/24 HOURS) 21 mg/24hr patch Place 1 patch (21 mg total) onto the skin daily at 6 (six) AM. (Patient not taking: Reported on 10/09/2015) 28 patch 0 Unknown at Unknown time  . QUEtiapine (SEROQUEL) 200 MG tablet Take 1 tablet (200 mg total) by mouth at bedtime. 14 tablet 0 Unknown at Unknown time    Musculoskeletal: Strength & Muscle Tone: within normal limits Gait & Station: normal Patient leans: N/A  Psychiatric Specialty Exam: Physical Exam  Constitutional: He is oriented to person, place, and time.  Neck: Normal range of motion.  Respiratory: Effort normal.  Musculoskeletal: Normal range of motion.  Neurological: He is alert and oriented to person, place, and time.  Skin: Skin is warm and dry.  Psychiatric: His speech is normal. His mood appears anxious. He is actively hallucinating. Cognition and memory are normal. He expresses impulsivity. He exhibits a depressed mood. He expresses no homicidal and no suicidal ideation.    Review of Systems  Psychiatric/Behavioral: Positive for depression.    Blood  pressure 108/84, pulse 112, temperature 97.7 F (36.5 C), temperature source Oral, resp. rate 18, height '5\' 5"'$  (1.651 m), weight 56.246 kg (124 lb).Body mass index is 20.63 kg/(m^2).  General Appearance: Disheveled  Eye Contact::  Good  Speech:  Clear and Coherent and Normal Rate  Volume:  Normal  Mood:  Depressed  Affect:  Congruent  Thought Process:  Circumstantial  Orientation:  Full (Time, Place, and Person)  Thought Content:  Hallucinations: Auditory Command:  telling him to step in front of car  Suicidal Thoughts:  Denies at this time  Homicidal Thoughts:  No  Memory:  Immediate;   Good Recent;   Good Remote;   Good  Judgement:  Fair  Insight:  Lacking and Shallow  Psychomotor Activity:  Normal  Concentration:  Fair  Recall:  Good  Fund of Knowledge:Fair  Language: Good  Akathisia:  No  Handed:  Right  AIMS (if indicated):     Assets:   Communication Skills Desire for Improvement  ADL's:  Intact  Cognition: WNL  Sleep:  Number of Hours: 4.5     Treatment Plan Summary: Daily contact with patient to assess and evaluate symptoms and progress in treatment and Medication management  Observation Level/Precautions:  15 minute checks  Laboratory:  CBC Chemistry Profile UDS UA  Reviewed ED labs  Psychotherapy:  Individual and group sessions  Medications:  Medications restarted Seroquel 200 mg Q hs for Depression and Mood Stabilization; Remeron 7.5 mg for Insomnia and Depression.  Ordered Ensure tid related to decrease appetite and weight loss.   Consultations:  As appropriate  Discharge Concerns:  Safety, stabilization, and access to medication  Estimated LOS: 5-7 days  Other:     I certify that inpatient services furnished can reasonably be expected to improve the patient's condition.    Earleen Newport, NP 4/30/20171:04 PM

## 2015-10-10 NOTE — Progress Notes (Signed)
Patient isolative to room except for meals. Refusing groups, unit activities. Affect flat with congruent mood. States his goal for today is to work on his concentration. Reports ongoing foot pain of a few months and rates it at an 8/10. Scores his depression and hopelessness both a 5/10, anxiety is a 0/10. States sleep was good and appetite fair. Medicated per orders, no prn's requested or required. Emotional support offered. Self inventory reviewed. He denies SI/HI and remains safe on level III obs. When asked about AVH, patient responds, "not recently."

## 2015-10-10 NOTE — [facility] Suicide Risk Assessment (Signed)
[facility] INPATIENT:  Family/Significant Other Suicide Prevention Education  Suicide Prevention Education:  Patient Refusal for Family/Significant Other Suicide Prevention Education: The patient Kurt Foley has refused to provide written consent for family/significant other to be provided Family/Significant Other Suicide Prevention Education during admission and/or prior to discharge.  Physician notified.  Sarina SerGrossman-Orr, Sostenes Kauffmann Jo 10/10/2015, 2:52 PM

## 2015-10-10 NOTE — Progress Notes (Signed)
Adult Psychoeducational Group Note  Date:  10/10/2015 Time:  9:34 PM  Group Topic/Focus:  Wrap-Up Group:   The focus of this group is to help patients review their daily goal of treatment and discuss progress on daily workbooks.  Participation Level:  Active  Participation Quality:  Appropriate and Attentive  Affect:  Appropriate  Cognitive:  Alert and Appropriate  Insight: Appropriate  Engagement in Group:  Engaged  Modes of Intervention:  Discussion, Education and Support  Additional Comments:  Pt rated his day at a 10 out of 10. Pt shared he had not been able to sleep in 3 or 4 days, but he was able to spend almost all of the day sleeping. Pt feels rested and energized now and feels as if he has got his sleep cycle back to normal.  Malachy MoanJeffers, Dauna Ziska S 10/10/2015, 9:34 PM

## 2015-10-10 NOTE — [facility] Group Notes (Signed)
[facility]  10/10/2015 10:00 PM  Type of Therapy:  Group Therapy  Participation Level:  Did Not Attend   Beverly SessionsLINDSEY, Ashlynn Gunnels J 10/10/2015, 3:51 PM

## 2015-10-10 NOTE — [facility] Suicide Risk Assessment (Signed)
[facility]   Nursing information obtained from:   pt Demographic factors:   male, homeless Current Mental Status:   depressed with SI Loss Factors:   loss of relationship Historical Factors:   hx of prior suicide attempts Risk Reduction Factors:     Total Time spent with patient: 1 hour Principal Problem: MDD (major depressive disorder), recurrent episode, severe ([facility]) Diagnosis:   Patient Active Problem List   Diagnosis Date Noted  . MDD (major depressive disorder), recurrent episode, severe (H[facility]) [F33.2] 10/09/2015  . Substance induced mood disorder ([facility]) [F19.94] 09/16/2015  . MDD (major depressive disorder), recurrent episode, moderate (H[facility]) [F33.1] 09/16/2015  . Cocaine dependence with cocaine-induced mood disorder (HCC) [F14.24]   . Cannabis use disorder, severe, dependence (HCC) [F12.20] 04/09/2015  . Severe recurrent major depression without psychotic features (HCC) [F33.2] 04/08/2015  . Cocaine dependence ([facility]) [F14.20] 04/08/2015  . Alcohol abuse [F10.10] 04/08/2015  . Screening examination for venereal disease [Z11.3] 03/25/2015  . Encounter for long-term (current) use of medications [Z79.899] 03/25/2015  . 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 AIDS (HCC) [B20]   . Tobacco abuse [Z72.0]   . Decreased appetite [R63.0]   . 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 CAP (community acquired pneumonia) [J18.9] 03/20/2015  . Leukocytosis [D72.829] 03/20/2015  . Contusion of right knee [S80.01XA] 03/20/2015  . Fall [W19.XXXA] 03/20/2015  . Loss of weight [R63.4] 02/12/2014  . Visual disturbance [H53.9] 11/18/2013  . Human immunodeficiency virus (HIV) disease (HCC) [B20] 10/30/2013  . Atopic dermatitis [L20.9] 10/30/2013   Subjective Data: Pt reports his girlfriend cheated on him and they broke up. He has been having SI and HI toward his girlfriend's lover. He is now homeless and very  stressed. He last used THC and cocaine 4 days ago. He is drinking alcohol- 2 40oz beers a day. States his previous meds were effective and he wants to restart them.   Continued Clinical Symptoms:  Alcohol Use Disorder Identification Test Final Score (AUDIT): 20 The "Alcohol Use Disorders Identification Test", Guidelines for Use in Primary Care, Second Edition.  World Science writerHealth Organization [facility](WHO). Score between 0-7:  no or low risk or alcohol related problems. Score between 8-15:  moderate risk of alcohol related problems. Score between 16-19:  high risk of alcohol related problems. Score 20 or above:  warrants further diagnostic evaluation for alcohol dependence and treatment.   CLINICAL FACTORS:   Depression:   Anhedonia Hopelessness Severe Alcohol/Substance Abuse/Dependencies Unstable or Poor Therapeutic Relationship Previous Psychiatric Diagnoses and Treatments   Musculoskeletal: Strength & Muscle Tone: within normal limits Gait & Station: normal Patient leans: N/A  Psychiatric Specialty Exam: ROS  Blood pressure 108/84, pulse 112, temperature 97.7 F (36.5 C), temperature source Oral, resp. rate 18, height 5\' 5"  (1.651 m), weight 56.246 kg (124 lb).Body mass index is 20.63 kg/(m^2).  General Appearance: Disheveled  Eye Contact::  Poor  Speech:  Clear and Coherent and Normal Rate  Volume:  Decreased  Mood:  Anxious, Depressed and Irritable  Affect:  Congruent  Thought Process:  Goal Directed  Orientation:  Full (Time, Place, and Person)  Thought Content:  Negative  Suicidal Thoughts:  Yes.  with intent/plan  Homicidal Thoughts:  No  Memory:  Immediate;   Good Recent;   Good Remote;   Good  Judgement:  Poor  Insight:  Shallow  Psychomotor Activity:  Decreased  Concentration:  Fair  Recall:  FiservFair  Fund  of Knowledge:Fair  Language: Fair  Akathisia:  No  Handed:  Right  AIMS (if indicated):     Assets:  Desire for Improvement  Sleep:  Number of Hours: 4.5   Cognition: WNL  ADL's:  Intact    COGNITIVE FEATURES THAT CONTRIBUTE TO RISK:  Closed-mindedness, Polarized thinking and Thought constriction (tunnel vision)    SUICIDE RISK:   Extreme:  Frequent, intense, and enduring suicidal ideation, specific plans, clear subjective and objective intent, impaired self-control, severe dysphoria/symptomatology, many risk factors and no protective factors.  PLAN OF CARE: Admit to inpt psych.  Restart Seroquel  po qHS Remeron 7.5mg  po qHS Neurontin  po TID for anxiety Reviewed labs CBC WNL, UDS + cocaine and THC, CMP shows ALT 16   I certify that inpatient services furnished can reasonably be expected to improve the patient's condition.   Oletta Darter, MD 10/10/2015, 8:27 AM

## 2015-10-11 ENCOUNTER — Ambulatory Visit: Payer: Self-pay | Admitting: Internal Medicine

## 2015-10-11 NOTE — Progress Notes (Addendum)
[facility] MD Progress Note  10/11/2015 3:17 PM Kurt Foley  MRN:  [id number] Subjective:  Patient reports improving mood, feels " better". At this time denies any medication side effects . As he improves he is starting to focus on discharge and states he is hoping to return to work soon, as he does not want to jeopardize his job. Objective : I have discussed case with treatment team and have met with patient . Patient is a 40 year old male, presented to ED for depression and suicidal ideations related to psychosocial stressors, relationship break up. Reports recent  increased drug, alcohol abuse. At this time does not present with any symptoms of ETOH WDL- no tremors, no diaphoresis, no acute distress or restlessness, vitals stable . Patient reports he remains upset and saddened by relationship loss, but states he feels " it was what needed to happen". States his SO was abusing drugs on an ongoing basis, making it harder for patient to be in the relationship , and remain sober . At this time patient is more focused on work, states " I love my job and I don't want to lose it, I hope I can get back to work soon". At this time denies medication side effects. No disruptive or agitated behaviors on unit, pleasant on approach.  Principal Problem: MDD (major depressive disorder), recurrent episode, severe ([place]) Diagnosis:   Patient Active Problem List   Diagnosis Date Noted  . MDD (major depressive disorder), recurrent episode, severe ([place]) [F33.2] 10/09/2015  . Substance induced mood disorder ([place]) [F19.94] 09/16/2015  . MDD (major depressive disorder), recurrent episode, moderate ([place]) [F33.1] 09/16/2015  . Cocaine dependence with cocaine-induced mood disorder ([place]) [F14.24]   . Cannabis use disorder, severe, dependence ([place]) [F12.20] 04/09/2015  . Severe recurrent major depression without psychotic features ([place]) [F33.2] 04/08/2015  . Cocaine dependence ([place]) [F14.20] 04/08/2015  . Alcohol abuse  [F10.10] 04/08/2015  . Screening examination for venereal disease [Z11.3] 03/25/2015  . Encounter for long-term (current) use of medications [Z79.899] 03/25/2015  . 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 AIDS ([place]) [B20]   . Tobacco abuse [Z72.0]   . Decreased appetite [R63.0]   . 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 CAP (community acquired pneumonia) [J18.9] 03/20/2015  . Leukocytosis [D72.829] 03/20/2015  . Contusion of right knee [S80.01XA] 03/20/2015  . Fall [W19.XXXA] 03/20/2015  . Loss of weight [R63.4] 02/12/2014  . Visual disturbance [H53.9] 11/18/2013  . Human immunodeficiency virus (HIV) disease ([place]) [B20] 10/30/2013  . Atopic dermatitis [L20.9] 10/30/2013   Total Time spent with patient: 25 minutes     Past Medical History:  Past Medical History  Diagnosis Date  . HIV infection ([place])   . Depression    History reviewed. No pertinent past surgical history. Family History:  Family History  Problem Relation Age of Onset  . Diabetes Mother   . Hypertension Mother   . Arthritis Mother     Social History:  History  Alcohol Use  . 1.2 oz/week  . 2 Standard drinks or equivalent per week    Comment: 3-4 days a week.Last drink: today     History  Drug Use  . Yes  . Special: Marijuana, Cocaine    Comment: Once a week. Last used yesterday.  Cocaine last used today.     Social History   Social History  . Marital Status: Married    Spouse Name: N/A  . Number of  Children: N/A  . Years of Education: N/A   Social History Main Topics  . Smoking status: Current Every Day Smoker -- 0.50 packs/day for 25 years    Types: Cigarettes  . Smokeless tobacco: Never Used     Comment: cutting back  . Alcohol Use: 1.2 oz/week    2 Standard drinks or equivalent per week     Comment: 3-4 days a week.Last drink: today  . Drug Use: Yes    Special: Marijuana, Cocaine     Comment: Once a week. Last used yesterday.   Cocaine last used today.   Marland Kitchen Sexual Activity: Not Asked     Comment: encouraged condom use   Other Topics Concern  . None   Social History Narrative   Additional Social History:   Sleep: Good  Appetite:  Good  Current Medications: Current Facility-Administered Medications  Medication Dose Route Frequency Provider Last Rate Last Dose  . abacavir-dolutegravir-lamiVUDine (TRIUMEQ) 600-50-300 MG per tablet 1 tablet  1 tablet Oral Daily Delfin Gant, NP   1 tablet at 10/11/15 0832  . acetaminophen (TYLENOL) tablet 650 mg  650 mg Oral Q6H PRN Delfin Gant, NP   650 mg at 10/11/15 0937  . albuterol (PROVENTIL HFA;VENTOLIN HFA) 108 (90 Base) MCG/ACT inhaler 2 puff  2 puff Inhalation Q6H PRN Delfin Gant, NP      . alum & mag hydroxide-simeth (MAALOX/MYLANTA) 200-200-20 MG/5ML suspension 30 mL  30 mL Oral Q4H PRN Delfin Gant, NP      . darunavir-cobicistat (PREZCOBIX) 800-150 MG per tablet 1 tablet  1 tablet Oral Daily Delfin Gant, NP   1 tablet at 10/11/15 [phone]  . feeding supplement (ENSURE ENLIVE) (ENSURE ENLIVE) liquid 237 mL  237 mL Oral TID BM Shuvon B Rankin, NP   Stopped at 10/11/15 1504  . gabapentin (NEURONTIN) capsule 300 mg  300 mg Oral TID Delfin Gant, NP   300 mg at 10/11/15 1218  . magnesium hydroxide (MILK OF MAGNESIA) suspension 30 mL  30 mL Oral Daily PRN Delfin Gant, NP      . mirtazapine (REMERON) tablet 7.5 mg  7.5 mg Oral QHS Delfin Gant, NP   7.5 mg at 10/10/15 2133  . nicotine (NICODERM CQ - dosed in mg/24 hours) patch 21 mg  21 mg Transdermal Daily Delfin Gant, NP   21 mg at 10/11/15 0833  . QUEtiapine (SEROQUEL) tablet 200 mg  200 mg Oral QHS Delfin Gant, NP   200 mg at 10/10/15 2133    Lab Results: No results found for this or any previous visit (from the past 48 hour(s)).  Blood Alcohol level:  Lab Results  Component Value Date   ETH 6* 10/09/2015   ETH <5 09/14/2015    Physical  Findings: AIMS: Facial and Oral Movements Muscles of Facial Expression: None, normal Lips and Perioral Area: None, normal Jaw: None, normal Tongue: None, normal,Extremity Movements Upper (arms, wrists, hands, fingers): None, normal Lower (legs, knees, ankles, toes): None, normal, Trunk Movements Neck, shoulders, hips: None, normal, Overall Severity Severity of abnormal movements (highest score from questions above): None, normal Incapacitation due to abnormal movements: None, normal Patient's awareness of abnormal movements (rate only patient's report): No Awareness, Dental Status Current problems with teeth and/or dentures?: No Does patient usually wear dentures?: No  CIWA:    COWS:     Musculoskeletal: Strength & Muscle Tone: within normal limits Gait & Station: normal Patient leans: N/A  Psychiatric  Specialty Exam: ROS no chest pain, no shortness of breath, no vomiting, no rash, L foot pain as above   Blood pressure 126/88, pulse 98, temperature 97.9 F (36.6 C), temperature source Oral, resp. rate 16, height _0  (1.651 m), weight 124 lb (56.246 kg).Body mass index is 20.63 kg/(m^2).  General Appearance: Fairly Groomed  Engineer, water::  Good  Speech:  Normal Rate  Volume:  Normal  Mood:  less depressed today, states feeling better   Affect:  Appropriate and smiles at times appropriately   Thought Process:  Linear  Orientation:  Full (Time, Place, and Person)  Thought Content:  denies hallucinations, no delusions, not internally preoccupied   Suicidal Thoughts:  No  Denies any suicidal ideations at this time and contracts for safety on the unit   Homicidal Thoughts:  No denies any homicidal or violent ideations   Memory:  recent and remote grossly intact   Judgement:  Fair  Insight:  Fair  Psychomotor Activity:  Normal  Concentration:  Good  Recall:  Good  Fund of Knowledge:Good  Language: Good  Akathisia:  Negative  Handed:  Right  AIMS (if indicated):     Assets:   Desire for Improvement Resilience  ADL's:  Intact  Cognition: WNL  Sleep:  Number of Hours: 6  Assessment - patient reports improving mood, feeling less depressed. At this time no SI. Tolerating medications well . Major stressor identified as recent break up with SO due to SO's ongoing drug abuse . No current withdrawal symptoms .  Treatment Plan Summary: Daily contact with patient to assess and evaluate symptoms and progress in treatment, Medication management, Plan inpatient treatment  and medications as below  Encourage ongoing group participation to work on coping skills and symptom reduction  Continue Seroquel  200 mgrs QHS for mood disorder  Continue Neurontin 300 mgrs TID for anxiety , pain  Continue Remeron 7.5 mgrs QHS for depression, insomnia  Continue Antiretroviral management for history of HIV COBOS, Felicita Gage, MD 10/11/2015, 3:17 PM

## 2015-10-11 NOTE — Progress Notes (Addendum)
NUTRITION ASSESSMENT  Pt identified as at risk on the Malnutrition Screen Tool  INTERVENTION: 1. Educated patient on the importance of nutrition and encouraged intake of food and beverages. 2. Discussed weight goals. 3. Supplements: continue Ensure Enlive TID, each supplement provides 350 kcal and 20 grams of protein   NUTRITION DIAGNOSIS: Inadequate oral intake related to polysubstance abuse PTA as evidenced by pt report.  Goal: Pt to meet >/= 90% of their estimated nutrition needs.  Monitor:  PO intake  Assessment:  Pt screened for MST. He was admitted with MDD and polysubstance abuse following breakup with his boyfriend. He also had auditory hallucinations PTA.   Per review, pt lost 5 lbs (4% body weight) from 04/07/15-09/13/45 which is not significant for time frame. Of note, weight trends indicate that pt has gained 11 lbs in the past 3 weeks.   Continue Ensure Enlive which has already been ordered TID.   40 y.o. male  Height: Ht Readings from Last 1 Encounters:  10/09/15 5\' 5"  (1.651 m)    Weight: Wt Readings from Last 1 Encounters:  10/09/15 124 lb (56.246 kg)    Weight Hx: Wt Readings from Last 10 Encounters:  10/09/15 124 lb (56.246 kg)  09/14/15 113 lb (51.256 kg)  04/07/15 118 lb (53.524 kg)  04/06/15 115 lb (52.164 kg)  03/25/15 120 lb (54.432 kg)  03/20/15 114 lb 6.7 oz (51.9 kg)  09/22/14 119 lb (53.978 kg)  08/31/14 120 lb (54.432 kg)  02/12/14 117 lb (53.071 kg)  11/18/13 126 lb (57.153 kg)    BMI:  Body mass index is 20.63 kg/(m^2). Pt meets criteria for normal weight based on current BMI.  Estimated Nutritional Needs: Kcal: 25-30 kcal/kg Protein: > 1 gram protein/kg Fluid: 1 ml/kcal  Diet Order: Diet regular Room service appropriate?: Yes; Fluid consistency:: Thin Pt is also offered choice of unit snacks mid-morning and mid-afternoon.  Pt is eating as desired.   Lab results and medications reviewed.      Trenton GammonJessica Richardson Dubree, RD,  LDN Inpatient Clinical Dietitian Pager # [phone] After hours/weekend pager # [phone]

## 2015-10-11 NOTE — [facility] Group Notes (Signed)
[facility] LCSW Group Therapy 10/11/2015  1:15 PM   Type of Therapy: Group Therapy  Participation Level: Did Not Attend. Patient invited to participate but declined.   Tyshun Tuckerman, MSW, LCSW Clinical Social Worker [facility] [phone]   

## 2015-10-11 NOTE — Progress Notes (Signed)
Adult Psychoeducational Group Note  Date:  10/11/2015 Time:  10:40 PM  Group Topic/Focus:  Wrap-Up Group:   The focus of this group is to help patients review their daily goal of treatment and discuss progress on daily workbooks.  Participation Level:  Active  Participation Quality:  Appropriate  Affect:  Appropriate  Cognitive:  Alert  Insight: Appropriate  Engagement in Group:  Engaged  Modes of Intervention:  Discussion  Additional Comments:  Patient states, "my day was good overall". Patient goal for today was to plan a after care after discharge.  Barrett Holthaus L Jeson Camacho 10/11/2015, 10:40 PM

## 2015-10-11 NOTE — Progress Notes (Signed)
D: Kurt Foley has remained in bed except during meals, and he requires substantial prompting to go to those. He appears uncomfortable and has received Tylenol for left foot pain without substantial relief. He has slept for much of the a.m. He denied SI. On his self inventory, he reported fair sleep, good appetite, low energy level, and poor concentration. He rated his depression 8/10, hopelessness 0/10, and anxiety 0/0.  A: Meds given as ordered. Q15 safety checks maintained. Support/encouragement offered. R: Pt remains free from harm and continues with treatment. Will continue to monitor for needs/safety.

## 2015-10-12 DIAGNOSIS — F332 Major depressive disorder, recurrent severe without psychotic features: Secondary | ICD-10-CM | POA: Insufficient documentation

## 2015-10-12 MED ORDER — QUETIAPINE FUMARATE 200 MG PO TABS: 200.0000 mg | ORAL_TABLET | Freq: Every day | ORAL | Status: DC

## 2015-10-12 MED ORDER — DARUNAVIR-COBICISTAT 800-150 MG PO TABS: 1.0000 | ORAL_TABLET | Freq: Every day | ORAL | Status: DC

## 2015-10-12 MED ORDER — ABACAVIR-DOLUTEGRAVIR-LAMIVUD 600-50-300 MG PO TABS: 1.0000 | ORAL_TABLET | Freq: Every day | ORAL | Status: DC

## 2015-10-12 MED ORDER — MIRTAZAPINE 7.5 MG PO TABS: 7.5000 mg | ORAL_TABLET | Freq: Every day | ORAL | Status: DC

## 2015-10-12 MED ORDER — GABAPENTIN 300 MG PO CAPS: 300.0000 mg | ORAL_CAPSULE | Freq: Three times a day (TID) | ORAL | Status: DC

## 2015-10-12 MED ORDER — NICOTINE 21 MG/24HR TD PT24: 21.0000 mg | MEDICATED_PATCH | Freq: Every day | TRANSDERMAL | Status: DC

## 2015-10-12 NOTE — Progress Notes (Signed)
  [facility] :  Will you be returning to the same living situation after discharge:  Yes,  patient plans to stay with a friend or family At discharge, do you have transportation home?: Yes,  friends/family or bus pass Do you have the ability to pay for your medications: Yes,  patient will be provided with prescriptions at discharge  Release of information consent forms completed and in the chart;  Patient's signature needed at discharge.  Patient to Follow up at: Follow-up Information    [facility]   Specialty:  Behavioral Health   Why:  Walk-In Clinic is open Monday-Friday 8am-3pm, and it is a long process so plan to be there for the day.  Also, be sure to ask for a VOUCHER for your medications.   Contact information:   [address] [place] [address] [phone]       Follow up with [facility] On 10/14/2015.   Why:  Intake appointment with Amy on Thursday May 4th at 1pm for case management services. Call office if you need to reschedule.    Contact information:   [employer] [place] [address] [phone]        Next level of care provider [facility] Link:no  Safety Planning and Suicide Prevention discussed: Yes,  with patient  Have you used any form of tobacco in the last 30 days? (Cigarettes, Smokeless Tobacco, Cigars, and/or Pipes): Yes  Has patient been referred to the Quitline?: Patient refused referral  Patient has been referred for addiction treatment: Yes  Bjorn Hallas, [place] CarboKristin L 10/12/2015, 1:07 PM

## 2015-10-12 NOTE — [facility] Group Notes (Signed)
The focus of this group is to educate the patient on the purpose and policies of crisis stabilization and provide a format to answer questions about their admission.  The group details unit policies and expectations of patients while admitted. Patient did not attend this group. 

## 2015-10-12 NOTE — Discharge Summary (Signed)
Physician Discharge Summary Note  Patient:  Kurt Foley is an 40 y.o., male MRN:  [id number] DOB:  02-Apr-1976 Patient phone:  [phone] (home)  Patient address:   [address] [place] [address],  Total Time spent with patient: 30 minutes  Date of Admission:  10/09/2015 Date of Discharge: 10/12/2015  Reason for Admission:  Worsening depression  Principal Problem: MDD (major depressive disorder), recurrent episode, severe [facility]) Discharge Diagnoses: Patient Active Problem List   Diagnosis Date Noted  . MDD (major depressive disorder), recurrent episode, severe (HCC) [F33.2] 10/09/2015  . Substance induced mood disorder (HCC) [F19.94] 09/16/2015  . MDD (major depressive disorder), recurrent episode, moderate (HCC) [F33.1] 09/16/2015  . Cocaine dependence with cocaine-induced mood disorder (HCC) [F14.24]   . Cannabis use disorder, severe, dependence (HCC) [F12.20] 04/09/2015  . Severe recurrent major depression without psychotic features (HCC) [F33.2] 04/08/2015  . Cocaine dependence (HCC) [F14.20] 04/08/2015  . Alcohol abuse [F10.10] 04/08/2015  . Screening examination for venereal disease [Z11.3] 03/25/2015  . Encounter for long-term (current) use of medications [Z79.899] 03/25/2015  . 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 AIDS (HCC) [B20]   . Tobacco abuse [Z72.0]   . Decreased appetite [R63.0]   . 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 CAP (community acquired pneumonia) [J18.9] 03/20/2015  . Leukocytosis [D72.829] 03/20/2015  . Contusion of right knee [S80.01XA] 03/20/2015  . Fall [W19.XXXA] 03/20/2015  . Loss of weight [R63.4] 02/12/2014  . Visual disturbance [H53.9] 11/18/2013  . Human immunodeficiency virus (HIV) disease (HCC) [B20] 10/30/2013  . Atopic dermatitis [L20.9] 10/30/2013   Past Psychiatric History:  See above noted  Past Medical History:  Past Medical History  Diagnosis Date  .  HIV infection (HCC)   . Depression    History reviewed. No pertinent past surgical history. Family History:  Family History  Problem Relation Age of Onset  . Diabetes Mother   . Hypertension Mother   . Arthritis Mother    Family Psychiatric  History:  See above noted Social History:  History  Alcohol Use  . 1.2 oz/week  . 2 Standard drinks or equivalent per week    Comment: 3-4 days a week.Last drink: today     History  Drug Use  . Yes  . Special: Marijuana, Cocaine    Comment: Once a week. Last used yesterday.  Cocaine last used today.     Social History   Social History  . Marital Status: Married    Spouse Name: N/A  . Number of Children: N/A  . Years of Education: N/A   Social History Main Topics  . Smoking status: Current Every Day Smoker -- 0.50 packs/day for 25 years    Types: Cigarettes  . Smokeless tobacco: Never Used     Comment: cutting back  . Alcohol Use: 1.2 oz/week    2 Standard drinks or equivalent per week     Comment: 3-4 days a week.Last drink: today  . Drug Use: Yes    Special: Marijuana, Cocaine     Comment: Once a week. Last used yesterday.  Cocaine last used today.   Marland Kitchen Sexual Activity: Not Asked     Comment: encouraged condom use   Other Topics Concern  . None   Social History Narrative    Hospital Course:  Kurt Foley 40 yr old black male admitted to [facility] after presenting to ED with complaints of worsening depression over the last 4 days.  Kurt Foley reports that he and his boyfriend broke up and after the break up he went on a bender with alcohol, mariajuana, and cocaine.    Kurt Foley was admitted for MDD (major depressive disorder), recurrent episode, severe (HCC) and crisis management.  He was treated with Seroquel 200 mgrs QHS for mood disorder, Neurontin 300 mgrs TID for anxiety/pain and Remeron 7.5 mgrs QHS for depression/nsomnia.  Antiretroviral management for history of HIV was continued.  Medical problems  were identified and treated as needed.  Home medications were restarted as appropriate.  Improvement was monitored by observation and Kurt Foley daily report of symptom reduction.  Emotional and mental status was monitored by daily self inventory reports completed by Kurt Foley and clinical staff.  Patient reported continued improvement, denied any new concerns.  Patient had been compliant on medications and denied side effects.  Support and encouragement was provided.    Patient did well during inpatient stay.  At time of discharge, patient rated both depression and anxiety levels to be manageable and minimal.  Patient was able to identify the triggers of emotional crises and de-stabilizations.  Patient identified the positive things in life that would help in dealing with feelings of loss, depression and unhealthy or abusive tendencies.         Kurt Foley was evaluated by the treatment team for stability and plans for continued recovery upon discharge.  He was offered further treatment options upon discharge including Residential, Intensive Outpatient and Outpatient treatment.  He will follow up with agencies listed below for medication management and counseling.  Encouraged patient to maintain satisfactory support network and home environment.  Advised to adhere to medication compliance and outpatient treatment follow up.      Kurt Foley motivation was an integral factor for scheduling further treatment.  Employment, transportation, bed availability, health status, family support, and any pending legal issues were also considered during his hospital stay.  Upon completion of this admission the patient was both mentally and medically stable for discharge denying suicidal/homicidal ideation, auditory/visual/tactile hallucinations, delusional thoughts and paranoia.      Physical Findings: AIMS: Facial and Oral Movements Muscles of Facial Expression: None, normal Lips and  Perioral Area: None, normal Jaw: None, normal Tongue: None, normal,Extremity Movements Upper (arms, wrists, hands, fingers): None, normal Lower (legs, knees, ankles, toes): None, normal, Trunk Movements Neck, shoulders, hips: None, normal, Overall Severity Severity of abnormal movements (highest score from questions above): None, normal Incapacitation due to abnormal movements: None, normal Patient's awareness of abnormal movements (rate only patient's report): No Awareness, Dental Status Current problems with teeth and/or dentures?: No Does patient usually wear dentures?: No  CIWA:    COWS:     Musculoskeletal: Strength & Muscle Tone: within normal limits Gait & Station: normal Patient leans: N/A  Psychiatric Specialty Exam:  SEE MD SRA Review of Systems  Psychiatric/Behavioral: Negative for suicidal ideas. Depression: improving. The patient is not nervous/anxious.   All other systems reviewed and are negative.   Blood pressure 136/107, pulse 95, temperature 97.3 F (36.3 C), temperature source Oral, resp. rate 18, height 5\' 5"  (1.651 m), weight 56.246 kg (124 lb).Body mass index is 20.63 kg/(m^2).  Have you used any form of tobacco in the last 30 days? (Cigarettes, Smokeless Tobacco, Cigars, and/or Pipes): Yes  Has this patient used any form of tobacco in the last 30 days? (Cigarettes, Smokeless Tobacco, Cigars, and/or Pipes) Yes, N/A  Blood Alcohol level:  Lab  Results  Component Value Date   ETH 6* 10/09/2015   ETH <5 09/14/2015    Metabolic Disorder Labs:  No results found for: HGBA1C, MPG No results found for: PROLACTIN Lab Results  Component Value Date   CHOL 200 07/29/2015   TRIG 169* 07/29/2015   HDL 48 07/29/2015   CHOLHDL 4.2 07/29/2015   VLDL 34* 07/29/2015   LDLCALC 118 07/29/2015   LDLCALC 74 10/30/2013    See Psychiatric Specialty Exam and Suicide Risk Assessment completed by Attending Physician prior to discharge.  Discharge destination:  Home  Is  patient on multiple antipsychotic therapies at discharge:  No   Has Patient had three or more failed trials of antipsychotic monotherapy by history:  No  Recommended Plan for Multiple Antipsychotic Therapies: NA    Medication List    STOP taking these medications        abacavir-dolutegravir-lamiVUDine 600-50-300 MG tablet  Commonly known as:  TRIUMEQ     albuterol 108 (90 Base) MCG/ACT inhaler  Commonly known as:  PROVENTIL HFA;VENTOLIN HFA      TAKE these medications      Indication   darunavir-cobicistat 800-150 MG tablet  Commonly known as:  PREZCOBIX  Take 1 tablet by mouth daily. Swallow whole. Do NOT crush, break or chew tablets. Take with food.   Indication:  HIV Disease     gabapentin 300 MG capsule  Commonly known as:  NEURONTIN  Take 1 capsule (300 mg total) by mouth 3 (three) times daily.   Indication:  Alcohol Withdrawal Syndrome, Cocaine Dependence, mood control     mirtazapine 7.5 MG tablet  Commonly known as:  REMERON  Take 1 tablet (7.5 mg total) by mouth at bedtime.   Indication:  Trouble Sleeping, Major Depressive Disorder     nicotine 21 mg/24hr patch  Commonly known as:  NICODERM CQ - dosed in mg/24 hours  Place 1 patch (21 mg total) onto the skin daily.   Indication:  Nicotine Addiction     QUEtiapine 200 MG tablet  Commonly known as:  SEROQUEL  Take 1 tablet (200 mg total) by mouth at bedtime.   Indication:  mood control       Follow-up Information    Go to [facility].   Specialty:  Behavioral Health   Why:  Walk-In Clinic is open Monday-Friday 8am-3pm, and it is a long process so plan to be there for the day.  Also, be sure to ask for a VOUCHER for your medications.   Contact information:   [address] [place] [address] [phone]       Follow up with [facility] On 10/14/2015.   Why:  Intake appointment with Amy on Thursday May 4th at 1pm for case management services. Call office if you need to reschedule.    Contact  information:   [employer]. [place], [place] [address] [phone]        Follow-up recommendations:  Diet:  as tol  Comments:  1.  Take all your medications as prescribed.   2.  Report any adverse side effects to outpatient provider. 3.  Patient instructed to not use alcohol or illegal drugs while on prescription medicines. 4.  In the event of worsening symptoms, instructed patient to call 911, the crisis hotline or go to nearest emergency room for evaluation of symptoms.  Signed: Lindwood Qua, NP [facility] 10/12/2015, 12:25 PM  Patient seen, Suicide Assessment Completed.  Disposition Plan Reviewed

## 2015-10-12 NOTE — Plan of Care (Signed)
Problem: Diagnosis: Increased Risk For Suicide Attempt Goal: STG-Patient Will Comply With Medication Regime Outcome: Progressing Pt has been compliant with medications tonight.       

## 2015-10-12 NOTE — Progress Notes (Signed)
D: Pt was in the day room upon initial approach. Pt has depressed affect and mood.  He reports his day was "fair" and that his goal is "to get to go home."  Pt reports he is discharging tomorrow and that he feels safe to do so.  Pt denies SI/HI, denies hallucinations, reports left foot pain of 8/10.  Pt has been visible in milieu interacting with peers and staff appropriately.  Pt attended evening group.   A: Introduced self to pt.  Actively listened to pt.  Met with pt and offered support and encouragement.  Medications administered per order.  PRN medication administered for pain. R: Pt is compliant with medications.  Pt verbally contracts for safety.  Will continue to monitor and assess.

## 2015-10-12 NOTE — Progress Notes (Signed)
Patient discharged per physician an order; patient denies SI/HI and A/V hallucinations; reviewed AVS, transaction record, and SRA with the patient and it was all given to the patient; patient has no other questions or concerns at this time; patient left the unit ambulatory 

## 2015-10-12 NOTE — Progress Notes (Signed)
D: Patient denies SI/HI and A/V hallucinations; patient reports sleep is fair; reports appetite is good; reports energy level is low; reports ability to pay attention is good; rates depression as 7/10; rates hopelessness 0/10; rates anxiety as 8/10; patient has had some complaints of back pain, patient has concern about housing situation; patient reports that he wants to leave today; physician is aware  A: Monitored q 15 minutes; patient encouraged to attend groups; patient educated about medications; patient given medications per physician orders; patient encouraged to express feelings and/or concerns  R: Patient is pleasant and appropriate; patient's interaction with staff and peers is appropriate; patient was able to set goal to talk with staff 1:1 when having feelings of SI; patient is taking medications as prescribed and tolerating medications; patient is attending some groups; patient was resting; patient preoccupied with housing and where he will be staying

## 2015-10-12 NOTE — Tx Team (Addendum)
Interdisciplinary Treatment Plan Update (Adult) Date: 10/12/2015   Time Reviewed: 9:30 AM  Progress in Treatment: Attending groups: No Participating in groups: No Taking medication as prescribed: Yes Tolerating medication: Yes Family/Significant other contact made: No, patient has declined collateral contact Patient understands diagnosis: Yes Discussing patient identified problems/goals with staff: Yes Medical problems stabilized or resolved: Yes Denies suicidal/homicidal ideation: Yes Issues/concerns per patient self-inventory: Yes Other:  New problem(s) identified: N/A  Discharge Plan or Barriers: Home with outpatient services.     Reason for Continuation of Hospitalization:  Depression Anxiety Medication Stabilization   Comments: N/A  Discharge anticipated for today 10/12/15   Patient is a 40yo male readmitted to the hospital with suicidal ideations and auditory/visual hallucinations and cocaine/alcohol/marijuana abuse. He reports primary trigger for SI was break-up with partner, homelessness, and being off HIV and psychiatric medications. Patient will benefit from crisis stabilization, medication evaluation, group therapy and psychoeducation, in addition to case management for discharge planning. At discharge it is recommended that Patient adhere to the established discharge plan and continue in treatment.         Review of initial/current patient goals per problem list:  1. Goal(s): Patient will participate in aftercare plan   Met: Yes   Target date: 3-5 days post admission date   As evidenced by: Patient will participate within aftercare plan AEB aftercare provider and housing plan at discharge being identified.   5/2: Goal met. Patient plans to return to previous living situation to follow up with outpatient services.   2. Goal (s): Patient will exhibit decreased depressive symptoms and suicidal ideations.   Met: Adequate for discharge per MD    Target date: 3-5 days post admission date   As evidenced by: Patient will utilize self rating of depression at 3 or below and demonstrate decreased signs of depression or be deemed stable for discharge by MD.   5/1: Patient rates depression at 8.  5/2: Adequate for discharge. Patient reports improvement in his symptoms and reports feeling safe to discharge.   3. Goal(s): Patient will demonstrate decreased signs and symptoms of anxiety.   Met: Yes   Target date: 3-5 days post admission date   As evidenced by: Patient will utilize self rating of anxiety at 3 or below and demonstrated decreased signs of anxiety, or be deemed stable for discharge by MD  5/1: Goal met. Patient rates anxiety at 0.    4. Goal(s): Patient will demonstrate decreased signs of withdrawal due to substance abuse   Met: Yes   Target date: 3-5 days post admission date   As evidenced by: Patient will produce a CIWA/COWS score of 0, have stable vitals signs, and no symptoms of withdrawal  5/2: Goal met. No withdrawal symptoms reported at this time per medical chart.     Attendees: Patient:    Family:    Physician: Dr. Parke Poisson 10/12/2015 9:30 AM  Nursing: Eulogio Bear, Marilynne Halsted, RN 10/12/2015 9:30 AM  Clinical Social Worker: Tilden Fossa, LCSW 10/12/2015 9:30 AM  Other: Maxie Better, LCSW  10/12/2015 9:30 AM  Other: 10/12/2015 9:30 AM  Other: Lars Pinks, Case Manager 10/12/2015 9:30 AM  Other:  Agustina Caroli; Samuel Jester, NP 10/12/2015 9:30 AM  Other:    Other:       Scribe for Treatment Team:  Tilden Fossa, [place]

## 2015-10-12 NOTE — [facility] Suicide Risk Assessment (Signed)
[facility]   Principal Problem: MDD (major depressive disorder), recurrent episode, severe (HCC) Discharge Diagnoses:  Patient Active Problem List   Diagnosis Date Noted  . MDD (major depressive disorder), recurrent episode, severe (HCC) [F33.2] 10/09/2015  . Substance induced mood disorder (HCC) [F19.94] 09/16/2015  . MDD (major depressive disorder), recurrent episode, moderate (HCC) [F33.1] 09/16/2015  . Cocaine dependence with cocaine-induced mood disorder (HCC) [F14.24]   . Cannabis use disorder, severe, dependence (HCC) [F12.20] 04/09/2015  . Severe recurrent major depression without psychotic features (HCC) [F33.2] 04/08/2015  . Cocaine dependence (HCC) [F14.20] 04/08/2015  . Alcohol abuse [F10.10] 04/08/2015  . Screening examination for venereal disease [Z11.3] 03/25/2015  . Encounter for long-term (current) use of medications [Z79.899] 03/25/2015  . 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 AIDS (HCC) [B20]   . Tobacco abuse [Z72.0]   . Decreased appetite [R63.0]   . 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 CAP (community acquired pneumonia) [J18.9] 03/20/2015  . Leukocytosis [D72.829] 03/20/2015  . Contusion of right knee [S80.01XA] 03/20/2015  . Fall [W19.XXXA] 03/20/2015  . Loss of weight [R63.4] 02/12/2014  . Visual disturbance [H53.9] 11/18/2013  . Human immunodeficiency virus (HIV) disease (HCC) [B20] 10/30/2013  . Atopic dermatitis [L20.9] 10/30/2013    Total Time spent with patient: 30 minutes  Musculoskeletal: Strength & Muscle Tone: within normal limits Gait & Station: normal Patient leans: N/A  Psychiatric Specialty Exam: ROS  Blood pressure 136/107, pulse 95, temperature 97.3 F (36.3 C), temperature source Oral, resp. rate 18, height 5\' 5"  (1.651 m), weight 124 lb (56.246 kg).Body mass index is 20.63 kg/(m^2).  General Appearance: Well Groomed  Eye Contact::  Good   Speech:  Normal Rate409  Volume:  Normal  Mood:  improved, denies depression at this time  Affect:  Appropriate and Full Range  Thought Process:  Linear  Orientation:  Full (Time, Place, and Person)  Thought Content:  denies hallucinations, no delusions   Suicidal Thoughts:  No- denies suicidal ideations, denies self injurious ideations   Homicidal Thoughts:  No- denies any homicidal ideations  Memory:  recent and remote grossly intact   Judgement:  Other:  improved   Insight:  improved   Psychomotor Activity:  Normal  Concentration:  Good  Recall:  Good  Fund of Knowledge:Good  Language: Good  Akathisia:  Negative  Handed:  Right  AIMS (if indicated):     Assets:  Desire for Improvement Resilience  Sleep:  Number of Hours: 6.75  Cognition: WNL  ADL's:  Intact   Mental Status Per Nursing Assessment::   On Admission:     Demographic Factors:  40 year old male   Loss Factors: Recent break up with SO, chronic medical illness  Historical Factors: History of depression, history of cocaine abuse , HIV (+)   Risk Reduction Factors:   Sense of responsibility to family, Living with another person, especially a relative and Positive social support  Continued Clinical Symptoms:  At this time patient reporting feeling much better, and presenting alert, attentive, well related, with improved mood and improved range of affect, currently euthymic. No thought disorder, no SI or HI, no psychotic symptoms, future oriented . Denies any current medication side effects  Cognitive Features That Contribute To Risk:  No gross cognitive deficits noted upon discharge. Is alert , attentive, and oriented x 3   Suicide Risk:  Mild:  Suicidal ideation of limited frequency, intensity, duration, and  specificity.  There are no identifiable plans, no associated intent, mild dysphoria and related symptoms, good self-control (both objective and subjective assessment), few other risk factors, and  identifiable protective factors, including available and accessible social support.  Follow-up Information    Go to [facility].   Specialty:  Behavioral Health   Why:  Walk-In Clinic is open Monday-Friday 8am-3pm, and it is a long process so plan to be there for the day.  Also, be sure to ask for a VOUCHER for your medications.   Contact information:   [address] [place] [address] [phone]       Follow up with [facility] On 10/14/2015.   Why:  Intake appointment with Amy on Thursday May 4th at 1pm for case management services. Call office if you need to reschedule.    Contact information:   [employer]. [place], [place] [address] [phone]        Plan Of Care/Follow-up recommendations:  Activity:  As tolerated  Diet:  Regular Tests:  NA Other:  see below   Patient is requesting discharge and  There are no current grounds for involuntary commitment - he is leaving in good spirits . Plans to live with a family member, whom he states is supportive and provides sober environment  Plans to follow up as above  Goes [facility] for HIV medication management   Nehemiah Massed, MD 10/12/2015, 12:22 PM

## 2015-10-14 ENCOUNTER — Ambulatory Visit: Payer: Self-pay | Admitting: Internal Medicine

## 2015-10-19 ENCOUNTER — Encounter: Payer: Self-pay | Admitting: Internal Medicine

## 2015-11-13 ENCOUNTER — Other Ambulatory Visit: Payer: Self-pay | Admitting: Infectious Diseases

## 2015-11-13 DIAGNOSIS — B20 Human immunodeficiency virus [HIV] disease: Secondary | ICD-10-CM

## 2015-11-25 ENCOUNTER — Telehealth: Payer: Self-pay

## 2015-11-25 NOTE — Telephone Encounter (Addendum)
Patient called wanting a medical record with his identification. He is trying to obtain a social securTree surgeonity print out for a job interview at 2:30 today. Explained to patient that items that we have will show his diagnosis. Patient stated not caring, he just needed a second identification to get the social security print out for the job. Print out copy of driver's license on file, immunization record, release of information given to patient. Rejeana Brockandace Murray, LPN

## 2015-12-09 ENCOUNTER — Other Ambulatory Visit: Payer: Self-pay

## 2015-12-23 ENCOUNTER — Ambulatory Visit: Payer: Self-pay | Admitting: Internal Medicine

## 2016-01-17 ENCOUNTER — Other Ambulatory Visit: Payer: Self-pay

## 2016-02-10 ENCOUNTER — Ambulatory Visit: Payer: Self-pay | Admitting: Internal Medicine

## 2016-02-21 ENCOUNTER — Other Ambulatory Visit: Payer: Self-pay | Admitting: Internal Medicine

## 2016-02-21 DIAGNOSIS — B20 Human immunodeficiency virus [HIV] disease: Secondary | ICD-10-CM

## 2016-03-25 ENCOUNTER — Other Ambulatory Visit: Payer: Self-pay | Admitting: Internal Medicine

## 2016-03-25 DIAGNOSIS — B20 Human immunodeficiency virus [HIV] disease: Secondary | ICD-10-CM

## 2016-05-06 ENCOUNTER — Other Ambulatory Visit: Payer: Self-pay | Admitting: Internal Medicine

## 2016-05-06 DIAGNOSIS — B20 Human immunodeficiency virus [HIV] disease: Secondary | ICD-10-CM

## 2016-05-18 ENCOUNTER — Ambulatory Visit: Payer: Self-pay | Admitting: Internal Medicine

## 2016-05-24 ENCOUNTER — Telehealth: Payer: Self-pay | Admitting: *Deleted

## 2016-05-24 NOTE — Telephone Encounter (Signed)
[facility] with [facility] called stating they do not have patient's ADAP info. Patient last seen 2016 and unsure if ADAP is current. Advised pharmacist to have the patient call our office. Wendall MolaJacqueline Katy Brickell

## 2016-06-15 ENCOUNTER — Encounter: Payer: Self-pay | Admitting: Internal Medicine

## 2016-06-15 ENCOUNTER — Other Ambulatory Visit: Payer: Self-pay | Admitting: *Deleted

## 2016-06-15 ENCOUNTER — Ambulatory Visit: Payer: Self-pay

## 2016-06-15 ENCOUNTER — Ambulatory Visit (INDEPENDENT_AMBULATORY_CARE_PROVIDER_SITE_OTHER): Payer: Self-pay | Admitting: Internal Medicine

## 2016-06-15 VITALS — BP 141/98 | HR 119 | Temp 97.4°F | Ht 66.0 in | Wt 124.0 lb

## 2016-06-15 DIAGNOSIS — Z720 Tobacco use: Secondary | ICD-10-CM

## 2016-06-15 DIAGNOSIS — Z23 Encounter for immunization: Secondary | ICD-10-CM

## 2016-06-15 DIAGNOSIS — B20 Human immunodeficiency virus [HIV] disease: Secondary | ICD-10-CM

## 2016-06-15 MED ORDER — MENINGOCOCCAL A C Y&W-135 OLIG IM SOLR
0.5000 mL | Freq: Once | INTRAMUSCULAR | Status: AC
  Administered 2016-06-15: 0.5 mL via INTRAMUSCULAR

## 2016-06-15 MED ORDER — DARUNAVIR-COBICISTAT 800-150 MG PO TABS: ORAL_TABLET | ORAL | 5 refills | Status: DC

## 2016-06-15 MED ORDER — ABACAVIR-DOLUTEGRAVIR-LAMIVUD 600-50-300 MG PO TABS: 1.0000 | ORAL_TABLET | Freq: Every day | ORAL | 5 refills | Status: DC

## 2016-06-16 LAB — T-HELPER CELL (CD4) - (RCID CLINIC ONLY)
CD4 % Helper T Cell: 13 % — ABNORMAL LOW (ref 33–55)
CD4 T Cell Abs: 140 /uL — ABNORMAL LOW (ref 400–2700)

## 2016-06-19 LAB — HIV-1 RNA ULTRAQUANT REFLEX TO GENTYP+
HIV 1 RNA QUANT: 11524 {copies}/mL — AB (ref <20)
HIV-1 RNA QUANT, LOG: 4.06 {Log_copies}/mL — AB (ref <1.30)

## 2016-06-19 NOTE — Progress Notes (Signed)
   Subjective:    Patient ID: Kurt Foley, male    DOB: 1975-10-25, 41 y.o.   MRN: [id number]  HPI Here for follow up of HIV  Has not been seen in over 1 year and unfortunatley has been out of Triumeq and taking Prezcobix alone.  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  Again interested in getting back in to care.  No weight loss, no n/v/d.  Also with substance abuse issues.  No complaints today.      Review of Systems  Constitutional: Negative for fatigue.  Eyes: Negative for visual disturbance.  Respiratory: Negative for shortness of breath.   Skin: Negative for rash.  Neurological: Negative for dizziness and headaches.       Objective:   Physical Exam  Constitutional: He appears well-developed and well-nourished. No distress.  Eyes: No scleral icterus.  Cardiovascular: Normal rate and regular rhythm.   No murmur heard. Pulmonary/Chest: Effort normal and breath sounds normal. No respiratory distress.  Skin: No rash noted.  Psychiatric: He has a normal mood and affect.   SH: tobacco abuse       Assessment & Plan:

## 2016-06-19 NOTE — Assessment & Plan Note (Addendum)
Will get him reestablished with ADAP and have him restart Triumeq and Prezcobix.  Counseled him on staying on track and taking both medications or none.  He can rtc 5 weeks.

## 2016-06-19 NOTE — Assessment & Plan Note (Signed)
counseled on cessation 

## 2016-06-26 LAB — HIV-1 GENOTYPR PLUS

## 2016-06-27 ENCOUNTER — Telehealth: Payer: Self-pay | Admitting: *Deleted

## 2016-06-27 NOTE — Telephone Encounter (Signed)
RN reviewing old referral and ran across Mr [facility] the 11[facility] of January. RN contacted Mr Marylyn IshiharaHerbin and congratulated him on his new home. RN explained to Mr Marylyn IshiharaHerbin my role and that I noticed he has recommitted to taking his medications. RN explained to the patient that I would love to be sure that his medications land in his hand and assist him with maintaining access to his medications and in care with Dr Luciana Axeomer. Mr. Marylyn IshiharaHerbin stated right now his is great and has already received his Prezcobix and Triumeq.   Mr Marylyn IshiharaHerbin stated he is ok right now and politely declined my services. I let Mr. Marylyn IshiharaHerbin now that [facility] is always here to support him so in the future I will still be available and happy to help him. Mr Marylyn IshiharaHerbin thanked me again.

## 2016-06-28 NOTE — Telephone Encounter (Signed)
Thanks

## 2016-07-11 ENCOUNTER — Emergency Department (HOSPITAL_COMMUNITY)
Admission: EM | Admit: 2016-07-11 | Discharge: 2016-07-11 | Disposition: A | Discharge status: Home / Self Care | Payer: Self-pay | Attending: Emergency Medicine | Admitting: Emergency Medicine

## 2016-07-11 ENCOUNTER — Encounter (HOSPITAL_COMMUNITY): Payer: Self-pay | Admitting: Emergency Medicine

## 2016-07-11 DIAGNOSIS — Z21 Asymptomatic human immunodeficiency virus [HIV] infection status: Secondary | ICD-10-CM | POA: Insufficient documentation

## 2016-07-11 DIAGNOSIS — R197 Diarrhea, unspecified: Secondary | ICD-10-CM

## 2016-07-11 LAB — CBC
HCT: 40.8 % (ref 39.0–52.0)
Hemoglobin: 13.9 g/dL (ref 13.0–17.0)
MCH: 31 pg (ref 26.0–34.0)
MCHC: 34.1 g/dL (ref 30.0–36.0)
MCV: 90.9 fL (ref 78.0–100.0)
PLATELETS: 256 10*3/uL (ref 150–400)
RBC: 4.49 MIL/uL (ref 4.22–5.81)
RDW: 13 % (ref 11.5–15.5)
WBC: 9.6 10*3/uL (ref 4.0–10.5)

## 2016-07-11 LAB — COMPREHENSIVE METABOLIC PANEL
ALT: 16 U/L — AB (ref 17–63)
AST: 28 U/L (ref 15–41)
Albumin: 3.4 g/dL — ABNORMAL LOW (ref 3.5–5.0)
Alkaline Phosphatase: 90 U/L (ref 38–126)
Anion gap: 7 (ref 5–15)
BILIRUBIN TOTAL: 0.8 mg/dL (ref 0.3–1.2)
BUN: 6 mg/dL (ref 6–20)
CHLORIDE: 105 mmol/L (ref 101–111)
CO2: 27 mmol/L (ref 22–32)
CREATININE: 1.08 mg/dL (ref 0.61–1.24)
Calcium: 9.2 mg/dL (ref 8.9–10.3)
Glucose, Bld: 105 mg/dL — ABNORMAL HIGH (ref 65–99)
POTASSIUM: 4.1 mmol/L (ref 3.5–5.1)
Sodium: 139 mmol/L (ref 135–145)
TOTAL PROTEIN: 7.6 g/dL (ref 6.5–8.1)

## 2016-07-11 LAB — URINALYSIS, ROUTINE W REFLEX MICROSCOPIC
BILIRUBIN URINE: NEGATIVE
Bacteria, UA: NONE SEEN
GLUCOSE, UA: NEGATIVE mg/dL
HGB URINE DIPSTICK: NEGATIVE
Ketones, ur: NEGATIVE mg/dL
Leukocytes, UA: NEGATIVE
NITRITE: NEGATIVE
PH: 5 (ref 5.0–8.0)
Protein, ur: 30 mg/dL — AB
RBC / HPF: NONE SEEN RBC/hpf (ref 0–5)
SPECIFIC GRAVITY, URINE: 1.02 (ref 1.005–1.030)

## 2016-07-11 LAB — LIPASE, BLOOD: LIPASE: 28 U/L (ref 11–51)

## 2016-07-11 LAB — I-STAT CG4 LACTIC ACID, ED
LACTIC ACID, VENOUS: 0.84 mmol/L (ref 0.5–1.9)
Lactic Acid, Venous: 0.63 mmol/L (ref 0.5–1.9)

## 2016-07-11 MED ORDER — LACTINEX PO CHEW: 1.0000 | CHEWABLE_TABLET | Freq: Three times a day (TID) | ORAL | 0 refills | Status: DC

## 2016-07-11 MED ORDER — LOPERAMIDE HCL 2 MG PO CAPS: 2.0000 mg | ORAL_CAPSULE | Freq: Three times a day (TID) | ORAL | 0 refills | Status: DC | PRN

## 2016-07-11 MED ORDER — SODIUM CHLORIDE 0.9 % IV BOLUS (SEPSIS)
1000.0000 mL | Freq: Once | INTRAVENOUS | Status: AC
  Administered 2016-07-11: 1000 mL via INTRAVENOUS

## 2016-07-11 NOTE — ED Notes (Signed)
EDP consulted about stool specimens.  EDP sts they are not needed at this time and pt is ready for discharge.

## 2016-07-11 NOTE — ED Provider Notes (Signed)
[facility] DEPT Provider Note   CSN: [id number] Arrival date & time: 07/11/16  1719    History   Chief Complaint Chief Complaint  Patient presents with  . Diarrhea    HPI Kurt Foley is a 41 y.o. male.  41 year old male with a history of HIV and depression presents to the emergency department for evaluation of diarrhea. He reports having 1.5 weeks of diarrhea which she describes as watery and nonbloody. He states that he has been having too numerous to count episodes each day. Initially, he thought that his symptoms may be due to restarting his HIV medications, but later reports that his symptoms began prior to restarting his meds. He has not been taking any medications for symptoms. He has had lack of appetite, but continues to hydrate. No associated nausea or vomiting. He has had some intermittent epigastric abdominal cramping, but denies any of this currently. He has not had any associated fevers. He feels as though he has lost weight. He denies recent travel, consumption of uncooked foods or meats, or drinking from a questionable water source.   The history is provided by the patient. No language interpreter was used.  Diarrhea      Past Medical History:  Diagnosis Date  . Depression   . HIV infection [facility])     Patient Active Problem List   Diagnosis Date Noted  . Substance induced mood disorder (HCC) 09/16/2015  . Cocaine dependence with cocaine-induced mood disorder (HCC)   . Cannabis use disorder, severe, dependence (HCC) 04/09/2015  . Severe recurrent major depression without psychotic features (HCC) 04/08/2015  . Alcohol abuse 04/08/2015  . Screening examination for venereal disease 03/25/2015  . Encounter for long-term (current) use of medications 03/25/2015  . Tobacco abuse   . Visual disturbance 11/18/2013  . Human immunodeficiency virus (HIV) disease (HCC) 10/30/2013  . Atopic dermatitis 10/30/2013    History reviewed. No pertinent surgical  history.     Home Medications    Prior to Admission medications   Medication Sig Start Date End Date Taking? Authorizing Provider  abacavir-dolutegravir-lamiVUDine (TRIUMEQ) 600-50-300 MG tablet Take 1 tablet by mouth daily. 06/15/16   [name], MD  darunavir-cobicistat (PREZCOBIX) 800-150 MG tablet TAKE 1 TABLET BY MOUTH DAILY. SWALLOW WHOLE. DO NOT CRUSH, BREAK OR CHEW TABLETS.TAKE WITH FOOD 06/15/16   [name], MD  gabapentin (NEURONTIN) 300 MG capsule Take 1 capsule (300 mg total) by mouth 3 (three) times daily. 10/12/15   Adonis Brook, NP  lactobacillus acidophilus & bulgar (LACTINEX) chewable tablet Chew 1 tablet by mouth 3 (three) times daily with meals. 07/11/16   Antony Madura, PA-C  loperamide (IMODIUM) 2 MG capsule Take 1 capsule (2 mg total) by mouth every 8 (eight) hours as needed for diarrhea or loose stools. 07/11/16   Antony Madura, PA-C  mirtazapine (REMERON) 7.5 MG tablet Take 1 tablet (7.5 mg total) by mouth at bedtime. 10/12/15   Adonis Brook, NP  QUEtiapine (SEROQUEL) 200 MG tablet Take 1 tablet (200 mg total) by mouth at bedtime. 10/12/15   Adonis Brook, NP    Family History Family History  Problem Relation Age of Onset  . Diabetes Mother   . Hypertension Mother   . Arthritis Mother     Social History Social History  Substance Use Topics  . Smoking status: Current Every Day Smoker    Packs/day: 0.50    Years: 25.00    Types: Cigarettes  . Smokeless tobacco: Never Used  Comment: cutting back  . Alcohol use 1.2 oz/week    2 Standard drinks or equivalent per week     Comment: 3-4 days a week.Last drink: today     Allergies   Patient has no known allergies.   Review of Systems Review of Systems  Gastrointestinal: Positive for diarrhea.   Ten systems reviewed and are negative for acute change, except as noted in the HPI.    Physical Exam Updated Vital Signs BP 119/97   Pulse 100   Temp 98.3 F (36.8 C) (Oral)   Resp 16   SpO2 99%    Physical Exam  Constitutional: He is oriented to person, place, and time. He appears well-developed and well-nourished. No distress.  Nontoxic appearing and in no acute distress.  HENT:  Head: Normocephalic and atraumatic.  Eyes: Conjunctivae and EOM are normal. No scleral icterus.  Neck: Normal range of motion.  Cardiovascular: Normal rate, regular rhythm and intact distal pulses.   Pulmonary/Chest: Effort normal. No respiratory distress. He has no wheezes. He has no rales.  Respirations even and unlabored. Lungs clear to auscultation bilaterally.  Abdominal: Soft. He exhibits no distension and no mass. There is no tenderness. There is no guarding.  Soft, nontender, nondistended abdomen. No masses or peritoneal signs.  Musculoskeletal: Normal range of motion.  Neurological: He is alert and oriented to person, place, and time. He exhibits normal muscle tone. Coordination normal.  Skin: Skin is warm and dry. No rash noted. He is not diaphoretic. No erythema. No pallor.  Psychiatric: He has a normal mood and affect. His behavior is normal.  Nursing note and vitals reviewed.    ED Treatments / Results  Labs (all labs ordered are listed, but only abnormal results are displayed) Labs Reviewed  COMPREHENSIVE METABOLIC PANEL - Abnormal; Notable for the following:       Result Value   Glucose, Bld 105 (*)    Albumin 3.4 (*)    ALT 16 (*)    All other components within normal limits  URINALYSIS, ROUTINE W REFLEX MICROSCOPIC - Abnormal; Notable for the following:    Protein, ur 30 (*)    Squamous Epithelial / LPF 0-5 (*)    All other components within normal limits  GASTROINTESTINAL PANEL BY PCR, STOOL (REPLACES STOOL CULTURE)  C DIFFICILE QUICK SCREEN W PCR REFLEX  LIPASE, BLOOD  CBC  I-STAT CG4 LACTIC ACID, ED  I-STAT CG4 LACTIC ACID, ED    EKG  EKG Interpretation None       Radiology No results found.  Procedures Procedures (including critical care  time)  Medications Ordered in ED Medications  sodium chloride 0.9 % bolus 1,000 mL (0 mLs Intravenous Stopped 07/11/16 2138)     Initial Impression / Assessment and Plan / ED Course  I have reviewed the triage vital signs and the nursing notes.  Pertinent labs & imaging results that were available during my care of the patient were reviewed by me and considered in my medical decision making (see chart for details).     41 year old male resents to the emergency department for evaluation of 1.5 weeks of diarrhea. Diarrhea has been nonbloody. No associated fever, nausea, or vomiting. Patient has a nontender abdomen today. He is afebrile with stable vital signs. Laboratory workup is noncontributory. Patient hydrated with 1 L of IV fluids. He has been asked to provide a GI pathogen panel, but was unable to do so for the 2.5 hours he was in the main ED. Patient  has tolerated Sprite and a [place] sandwich. He is requesting a work note. I do not believe further emergent workup is indicated. Patient has been discharged with instructions for supportive care. Return precautions discussed and provided. Patient discharged in stable condition.   Final Clinical Impressions(s) / ED Diagnoses   Final diagnoses:  Diarrhea in adult patient    New Prescriptions Discharge Medication List as of 07/11/2016  9:58 PM    START taking these medications   Details  lactobacillus acidophilus & bulgar (LACTINEX) chewable tablet Chew 1 tablet by mouth 3 (three) times daily with meals., Starting Tue 07/11/2016, Print    loperamide (IMODIUM) 2 MG capsule Take 1 capsule (2 mg total) by mouth every 8 (eight) hours as needed for diarrhea or loose stools., Starting Tue 07/11/2016, Print         [place], PA-C 07/11/16 2359    Nira Conn, MD 07/12/16 [phone]

## 2016-07-11 NOTE — ED Notes (Addendum)
Pt presents with almost 2 wk hx of diarrhea after restarting HIV meds after being off them for a month due to insurance issues.  Also c/o upper abdominal pain for just as long.  Pt also has a continuous cough and fatigue that has been there for "a while."  Also feels like he as lost weight.

## 2016-07-11 NOTE — ED Notes (Signed)
Pt given [place] sandwich bag and sprite with MD permission.

## 2016-07-11 NOTE — ED Triage Notes (Signed)
Pt sts fatigue and diarrhea x 1 week since going back on HIV meds

## 2016-07-12 ENCOUNTER — Encounter: Payer: Self-pay | Admitting: Internal Medicine

## 2016-07-17 ENCOUNTER — Ambulatory Visit: Payer: Self-pay | Admitting: Internal Medicine

## 2016-07-21 ENCOUNTER — Other Ambulatory Visit: Payer: Self-pay | Admitting: *Deleted

## 2016-07-21 DIAGNOSIS — B20 Human immunodeficiency virus [HIV] disease: Secondary | ICD-10-CM

## 2016-07-21 MED ORDER — ABACAVIR-DOLUTEGRAVIR-LAMIVUD 600-50-300 MG PO TABS: 1.0000 | ORAL_TABLET | Freq: Every day | ORAL | 5 refills | Status: DC

## 2016-07-21 MED ORDER — DARUNAVIR-COBICISTAT 800-150 MG PO TABS: ORAL_TABLET | ORAL | 5 refills | Status: DC

## 2016-07-24 ENCOUNTER — Other Ambulatory Visit: Payer: Self-pay | Admitting: *Deleted

## 2016-08-12 ENCOUNTER — Emergency Department (HOSPITAL_COMMUNITY)
Admission: EM | Admit: 2016-08-12 | Discharge: 2016-08-12 | Disposition: A | Discharge status: Home / Self Care | Payer: Self-pay | Attending: Emergency Medicine | Admitting: Emergency Medicine

## 2016-08-12 ENCOUNTER — Encounter (HOSPITAL_COMMUNITY): Payer: Self-pay | Admitting: *Deleted

## 2016-08-12 ENCOUNTER — Emergency Department (HOSPITAL_COMMUNITY): Payer: Self-pay

## 2016-08-12 DIAGNOSIS — R5383 Other fatigue: Secondary | ICD-10-CM | POA: Insufficient documentation

## 2016-08-12 DIAGNOSIS — Z79899 Other long term (current) drug therapy: Secondary | ICD-10-CM | POA: Insufficient documentation

## 2016-08-12 DIAGNOSIS — F17210 Nicotine dependence, cigarettes, uncomplicated: Secondary | ICD-10-CM | POA: Insufficient documentation

## 2016-08-12 LAB — HEPATIC FUNCTION PANEL
ALT: 14 U/L — ABNORMAL LOW (ref 17–63)
AST: 30 U/L (ref 15–41)
Albumin: 3.6 g/dL (ref 3.5–5.0)
Alkaline Phosphatase: 118 U/L (ref 38–126)
Bilirubin, Direct: <0.1 mg/dL — ABNORMAL LOW (ref 0.1–0.5)
Total Bilirubin: 0.5 mg/dL (ref 0.3–1.2)
Total Protein: 7.2 g/dL (ref 6.5–8.1)

## 2016-08-12 LAB — URINALYSIS, ROUTINE W REFLEX MICROSCOPIC
Bacteria, UA: NONE SEEN
Bilirubin Urine: NEGATIVE
Glucose, UA: NEGATIVE mg/dL
Hgb urine dipstick: NEGATIVE
Ketones, ur: NEGATIVE mg/dL
Leukocytes, UA: NEGATIVE
Nitrite: NEGATIVE
Protein, ur: 30 mg/dL — AB
RBC / HPF: NONE SEEN RBC/hpf (ref 0–5)
Specific Gravity, Urine: 1.032 — ABNORMAL HIGH (ref 1.005–1.030)
Squamous Epithelial / HPF: NONE SEEN
pH: 5 (ref 5.0–8.0)

## 2016-08-12 LAB — BASIC METABOLIC PANEL
Anion gap: 8 (ref 5–15)
BUN: 8 mg/dL (ref 6–20)
CALCIUM: 9.4 mg/dL (ref 8.9–10.3)
CO2: 23 mmol/L (ref 22–32)
Chloride: 107 mmol/L (ref 101–111)
Creatinine, Ser: 0.9 mg/dL (ref 0.61–1.24)
GFR calc Af Amer: >60 mL/min (ref >60)
GLUCOSE: 117 mg/dL — AB (ref 65–99)
POTASSIUM: 3.7 mmol/L (ref 3.5–5.1)
Sodium: 138 mmol/L (ref 135–145)

## 2016-08-12 LAB — CBC
HCT: 39.5 % (ref 39.0–52.0)
Hemoglobin: 13.3 g/dL (ref 13.0–17.0)
MCH: 30.6 pg (ref 26.0–34.0)
MCHC: 33.7 g/dL (ref 30.0–36.0)
MCV: 91 fL (ref 78.0–100.0)
Platelets: 287 10*3/uL (ref 150–400)
RBC: 4.34 MIL/uL (ref 4.22–5.81)
RDW: 14.3 % (ref 11.5–15.5)
WBC: 8.1 10*3/uL (ref 4.0–10.5)

## 2016-08-12 LAB — LIPASE, BLOOD: LIPASE: 28 U/L (ref 11–51)

## 2016-08-12 MED ORDER — SODIUM CHLORIDE 0.9 % IV BOLUS (SEPSIS)
1000.0000 mL | Freq: Once | INTRAVENOUS | Status: AC
  Administered 2016-08-12: 1000 mL via INTRAVENOUS

## 2016-08-12 NOTE — ED Notes (Signed)
Pt given Sprite for fluid challenge. Pt tolerating well. Will continue to monitor.  

## 2016-08-12 NOTE — ED Provider Notes (Signed)
[facility] Provider Note   CSN: [id number] Arrival date & time: 08/12/16  1212     History   Chief Complaint Chief Complaint  Patient presents with  . Fatigue  . Near Syncope    HPI Kurt Foley is a 41 y.o. male.  HPI   Patient is a 41 year old male with history of HIV who presents to the ED with complaint of fatigue. Patient reports over the past 2 days he has been feeling more fatigued with associated weakness, generalized body aches. He reports having multiple episodes of nonbloody diarrhea daily for the past 2 weeks but states that resolved 2-3 days ago. He notes over the past 2 days he has had intermittent abdominal pain which she described as diffuse sharp cramping pain. Denies any aggravating or alleviating factors. He also states he has had mild dysuria for the past few days. He notes he has felt short of breath with activity over the past few weeks but notes this occurs intermittently which he relates to smoking. Patient states he has had lightheadedness upon standing over the past few days. Denies any syncopal episodes. Patient reports this is happened to him in the past when he has been dehydrated. Denies fever, nasal congestion, rhinorrhea, sore throat, cough, hemoptysis, chest pain, headache, neck stiffness, nausea, vomiting, urinary symptoms, rash. Patient denies taking any medications for his symptoms. Denies any known sick contacts. Denies any recent hospitalizations. Patient reports he has been taking his HIV medications as prescribed.  ID- Dr. Luciana Axe  Past Medical History:  Diagnosis Date  . Depression   . HIV infection [facility])     Patient Active Problem List   Diagnosis Date Noted  . Substance induced mood disorder (HCC) 09/16/2015  . Cocaine dependence with cocaine-induced mood disorder (HCC)   . Cannabis use disorder, severe, dependence (HCC) 04/09/2015  . Severe recurrent major depression without psychotic features (HCC) 04/08/2015  . Alcohol  abuse 04/08/2015  . Screening examination for venereal disease 03/25/2015  . Encounter for long-term (current) use of medications 03/25/2015  . Tobacco abuse   . Visual disturbance 11/18/2013  . Human immunodeficiency virus (HIV) disease (HCC) 10/30/2013  . Atopic dermatitis 10/30/2013    History reviewed. No pertinent surgical history.     Home Medications    Prior to Admission medications   Medication Sig Start Date End Date Taking? Authorizing Provider  abacavir-dolutegravir-lamiVUDine (TRIUMEQ) 600-50-300 MG tablet Take 1 tablet by mouth daily. 07/21/16  Yes [name], MD  darunavir-cobicistat (PREZCOBIX) 800-150 MG tablet TAKE 1 TABLET BY MOUTH DAILY. SWALLOW WHOLE. DO NOT CRUSH, BREAK OR CHEW TABLETS.TAKE WITH FOOD 07/21/16  Yes [name], MD  gabapentin (NEURONTIN) 300 MG capsule Take 1 capsule (300 mg total) by mouth 3 (three) times daily. 10/12/15  Yes Adonis Brook, NP  mirtazapine (REMERON) 7.5 MG tablet Take 1 tablet (7.5 mg total) by mouth at bedtime. 10/12/15  Yes Adonis Brook, NP  QUEtiapine (SEROQUEL) 200 MG tablet Take 1 tablet (200 mg total) by mouth at bedtime. 10/12/15  Yes Adonis Brook, NP  lactobacillus acidophilus & bulgar (LACTINEX) chewable tablet Chew 1 tablet by mouth 3 (three) times daily with meals. Patient not taking: Reported on 08/12/2016 07/11/16   Antony Madura, PA-C  loperamide (IMODIUM) 2 MG capsule Take 1 capsule (2 mg total) by mouth every 8 (eight) hours as needed for diarrhea or loose stools. 07/11/16   Antony Madura, PA-C    Family History Family History  Problem Relation Age of Onset  .  Diabetes Mother   . Hypertension Mother   . Arthritis Mother     Social History Social History  Substance Use Topics  . Smoking status: Current Every Day Smoker    Packs/day: 0.50    Years: 25.00    Types: Cigarettes  . Smokeless tobacco: Never Used     Comment: cutting back  . Alcohol use 1.2 oz/week    2 Standard drinks or equivalent per week      Comment: 3-4 days a week.Last drink: today     Allergies   Patient has no known allergies.   Review of Systems Review of Systems  Constitutional: Positive for fatigue.  Respiratory: Positive for shortness of breath.   Gastrointestinal: Positive for abdominal pain (intermittent) and diarrhea.  Genitourinary: Positive for dysuria.  Neurological: Positive for weakness and light-headedness.  All other systems reviewed and are negative.    Physical Exam Updated Vital Signs BP 124/91   Pulse 72   Temp 98 F (36.7 C) (Oral)   Resp 20   Ht 5\' 6"  (1.676 m)   Wt 54.4 kg   SpO2 100%   BMI 19.37 kg/m   Physical Exam  Constitutional: He is oriented to person, place, and time. He appears well-developed and well-nourished. No distress.  HENT:  Head: Normocephalic and atraumatic.  Mouth/Throat: Uvula is midline, oropharynx is clear and moist and mucous membranes are normal. No oropharyngeal exudate, posterior oropharyngeal edema, posterior oropharyngeal erythema or tonsillar abscesses. No tonsillar exudate.  Eyes: Conjunctivae and EOM are normal. Right eye exhibits no discharge. Left eye exhibits no discharge. No scleral icterus.  Neck: Normal range of motion. Neck supple.  Cardiovascular: Normal rate, regular rhythm, normal heart sounds and intact distal pulses.   Pulmonary/Chest: Effort normal and breath sounds normal. No respiratory distress. He has no wheezes. He has no rales. He exhibits no tenderness.  Abdominal: Soft. Bowel sounds are normal. He exhibits no distension and no mass. There is no tenderness. There is no rebound and no guarding. No hernia.  Musculoskeletal: Normal range of motion. He exhibits no edema.  Neurological: He is alert and oriented to person, place, and time. No cranial nerve deficit or sensory deficit. Coordination normal.  Patient able to stand and ambulate without assistance, no ataxia noted.  Skin: Skin is warm and dry. No rash noted. He is not  diaphoretic.  Nursing note and vitals reviewed.    ED Treatments / Results  Labs (all labs ordered are listed, but only abnormal results are displayed) Labs Reviewed  BASIC METABOLIC PANEL - Abnormal; Notable for the following:       Result Value   Glucose, Bld 117 (*)    All other components within normal limits  URINALYSIS, ROUTINE W REFLEX MICROSCOPIC - Abnormal; Notable for the following:    APPearance HAZY (*)    Specific Gravity, Urine 1.032 (*)    Protein, ur 30 (*)    All other components within normal limits  HEPATIC FUNCTION PANEL - Abnormal; Notable for the following:    ALT 14 (*)    Bilirubin, Direct <0.1 (*)    All other components within normal limits  CBC  LIPASE, BLOOD    EKG  EKG Interpretation None       Radiology Dg Chest 2 View  Result Date: 08/12/2016 CLINICAL DATA:  Shortness of breath. EXAM: CHEST  2 VIEW COMPARISON:  Radiographs of August 10, 2015. FINDINGS: The heart size and mediastinal contours are within normal limits. Both lungs are  clear. No pneumothorax or pleural effusion is noted. The visualized skeletal structures are unremarkable. IMPRESSION: No active cardiopulmonary disease. Electronically Signed   By: Lupita Raider, M.D.   On: 08/12/2016 13:30    Procedures Procedures (including critical care time)  Medications Ordered in ED Medications  sodium chloride 0.9 % bolus 1,000 mL (0 mLs Intravenous Stopped 08/12/16 1652)     Initial Impression / Assessment and Plan / ED Course  I have reviewed the triage vital signs and the nursing notes.  Pertinent labs & imaging results that were available during my care of the patient were reviewed by me and considered in my medical decision making (see chart for details).     Patient presents with generalized weakness for the past 2 days. He notes he had multiple episodes of nonbloody diarrhea for the past 2 weeks which resolved 2-3 days ago. Reports having intermittent diffuse abdominal  pain and dysuria. Reports lightheadedness upon standing. Denies fever. Reports he has been taking his HIV medication as prescribed. Denies any known sick contacts or recent hospitalizations. VSS. Exam unremarkable. No neuro deficits. Patient given IV fluids.  Chart review shows labs performed on 06/15/16 revealed CD4 count 140, viral load 11,524. Chart review shows patient last visit with Dr. Luciana Axe on 06/15/16 revealed patient has not been seen in over a year and has been out of his Triumeq, only taking Prezcobix alone. Pt was restarted on medications and advised to recheck in 5 weeks.   Labs and urine unremarkable. Chest x-ray negative. Negative orthostatics. On reevaluation patient is sitting resting comfortably in bed watching TV, denies any pain or complaints. Patient tolerating PO. Suspect patient's symptoms may likely be due to mild dehydration associated with recent episode of viral gastritis. On repeat exam patient does not have a surgical abdomin and there are no peritoneal signs.  No indication of appendicitis, bowel obstruction, bowel perforation, cholecystitis, diverticulitis, UTI, pyelonephritis.  Plan to discharge patient home with symptomatic treatment. Advised patient to follow up with his ID physician at his scheduled appointment next week. Discussed return precautions.     Final Clinical Impressions(s) / ED Diagnoses   Final diagnoses:  Fatigue, unspecified type    New Prescriptions New Prescriptions   No medications on file     Barrett Henle, PA-C 08/12/16 1740    Benjiman Core, MD 08/12/16 2233

## 2016-08-12 NOTE — ED Triage Notes (Addendum)
Pt reports diarrhea and generalized fatigue since yesterday. Reports feeling lightheaded,generalized bodyaches, near syncopal and occ sob. No acute distress noted at triage.

## 2016-08-12 NOTE — Discharge Instructions (Signed)
Continue taking your home medications as prescribed. I recommend continuing to drink fluids at home to remain hydrated. Follow-up with Dr. Luciana Axeomer at your scheduled appointment next week. Please return to the Emergency Department if symptoms worsen or new onset of fever, headache, neck stiffness, cough, chest pain, shortness of breath, abdominal pain, vomiting, rash.

## 2016-08-30 ENCOUNTER — Ambulatory Visit: Payer: Self-pay | Admitting: Internal Medicine

## 2016-10-07 ENCOUNTER — Emergency Department (HOSPITAL_COMMUNITY)
Admission: EM | Admit: 2016-10-07 | Discharge: 2016-10-08 | Disposition: A | Discharge status: Home / Self Care | Payer: Self-pay | Attending: Emergency Medicine | Admitting: Emergency Medicine

## 2016-10-07 ENCOUNTER — Encounter (HOSPITAL_COMMUNITY): Payer: Self-pay

## 2016-10-07 DIAGNOSIS — R55 Syncope and collapse: Secondary | ICD-10-CM | POA: Insufficient documentation

## 2016-10-07 DIAGNOSIS — F17210 Nicotine dependence, cigarettes, uncomplicated: Secondary | ICD-10-CM | POA: Insufficient documentation

## 2016-10-07 LAB — URINALYSIS, ROUTINE W REFLEX MICROSCOPIC
BACTERIA UA: NONE SEEN
Bilirubin Urine: NEGATIVE
Glucose, UA: NEGATIVE mg/dL
Hgb urine dipstick: NEGATIVE
KETONES UR: NEGATIVE mg/dL
Leukocytes, UA: NEGATIVE
Nitrite: NEGATIVE
PH: 6 (ref 5.0–8.0)
PROTEIN: 30 mg/dL — AB
SQUAMOUS EPITHELIAL / LPF: NONE SEEN
Specific Gravity, Urine: 1.023 (ref 1.005–1.030)
WBC UA: NONE SEEN WBC/hpf (ref 0–5)

## 2016-10-07 LAB — CBC
HEMATOCRIT: 39.4 % (ref 39.0–52.0)
Hemoglobin: 13.3 g/dL (ref 13.0–17.0)
MCH: 30.6 pg (ref 26.0–34.0)
MCHC: 33.8 g/dL (ref 30.0–36.0)
MCV: 90.6 fL (ref 78.0–100.0)
PLATELETS: 280 10*3/uL (ref 150–400)
RBC: 4.35 MIL/uL (ref 4.22–5.81)
RDW: 14.9 % (ref 11.5–15.5)
WBC: 8.1 10*3/uL (ref 4.0–10.5)

## 2016-10-07 LAB — BASIC METABOLIC PANEL
Anion gap: 8 (ref 5–15)
BUN: 6 mg/dL (ref 6–20)
CHLORIDE: 105 mmol/L (ref 101–111)
CO2: 24 mmol/L (ref 22–32)
CREATININE: 0.97 mg/dL (ref 0.61–1.24)
Calcium: 9 mg/dL (ref 8.9–10.3)
GFR calc non Af Amer: >60 mL/min (ref >60)
Glucose, Bld: 84 mg/dL (ref 65–99)
POTASSIUM: 4.4 mmol/L (ref 3.5–5.1)
Sodium: 137 mmol/L (ref 135–145)

## 2016-10-07 NOTE — ED Provider Notes (Signed)
[facility]-EMERGENCY DEPT Provider Note   CSN: [id number] Arrival date & time: 10/07/16  2216   By signing my name below, I, Clovis Pu, attest that this documentation has been prepared under the direction and in the presence of Zadie Rhine, MD  Electronically Signed: Clovis Pu, ED Scribe. 10/07/16. 11:51 PM.   History   Chief Complaint Chief Complaint  Patient presents with  . Headache  . Loss of Consciousness    HPI Comments:  Kurt Foley is a 41 y.o. male who presents to the Emergency Department s/p an episode of near syncope which occurred earlier today. Pt states he was getting ready for work when he felt an acute onset, moderate headache located behind his eyes. Pt states he stood up, noticed spots in his vision, became "wobbly" and slowly lowered himself to the ground. Pt also reports an intermittent cough, SOB secondary to his cough, fatigue, mild diarrhea and a decrease in appetite. No alleviating factors noted. Pt denies chest pain, abdominal pain, nausea, vomiting, hemoptysis, numbness, tongue injury or any other associated symptoms. He also denies any new medication changes,denies a hx of stroke, a hx of MI and a hx of seizures. No other complaints noted at this time.   The history is provided by the patient. No language interpreter was used.  Headache   This is a new problem. The problem has been resolved. The headache is associated with an unknown factor. The pain is moderate. The pain does not radiate. Associated symptoms include syncope and shortness of breath. Pertinent negatives include no fever and no vomiting. He has tried nothing for the symptoms.    Past Medical History:  Diagnosis Date  . Depression   . HIV infection [facility])     Patient Active Problem List   Diagnosis Date Noted  . Substance induced mood disorder (HCC) 09/16/2015  . Cocaine dependence with cocaine-induced mood disorder (HCC)   . Cannabis use disorder, severe, dependence (HCC)  04/09/2015  . Severe recurrent major depression without psychotic features (HCC) 04/08/2015  . Alcohol abuse 04/08/2015  . Screening examination for venereal disease 03/25/2015  . Encounter for long-term (current) use of medications 03/25/2015  . Tobacco abuse   . Visual disturbance 11/18/2013  . Human immunodeficiency virus (HIV) disease (HCC) 10/30/2013  . Atopic dermatitis 10/30/2013    History reviewed. No pertinent surgical history.     Home Medications    Prior to Admission medications   Medication Sig Start Date End Date Taking? Authorizing Provider  abacavir-dolutegravir-lamiVUDine (TRIUMEQ) 600-50-300 MG tablet Take 1 tablet by mouth daily. 07/21/16   [name], MD  darunavir-cobicistat (PREZCOBIX) 800-150 MG tablet TAKE 1 TABLET BY MOUTH DAILY. SWALLOW WHOLE. DO NOT CRUSH, BREAK OR CHEW TABLETS.TAKE WITH FOOD 07/21/16   [name], MD  gabapentin (NEURONTIN) 300 MG capsule Take 1 capsule (300 mg total) by mouth 3 (three) times daily. 10/12/15   Adonis Brook, NP  lactobacillus acidophilus & bulgar (LACTINEX) chewable tablet Chew 1 tablet by mouth 3 (three) times daily with meals. Patient not taking: Reported on 08/12/2016 07/11/16   Antony Madura, PA-C  loperamide (IMODIUM) 2 MG capsule Take 1 capsule (2 mg total) by mouth every 8 (eight) hours as needed for diarrhea or loose stools. 07/11/16   Antony Madura, PA-C  mirtazapine (REMERON) 7.5 MG tablet Take 1 tablet (7.5 mg total) by mouth at bedtime. 10/12/15   Adonis Brook, NP  QUEtiapine (SEROQUEL) 200 MG tablet Take 1 tablet (200 mg total) by mouth at  bedtime. 10/12/15   Adonis Brook, NP    Family History Family History  Problem Relation Age of Onset  . Diabetes Mother   . Hypertension Mother   . Arthritis Mother     Social History Social History  Substance Use Topics  . Smoking status: Current Every Day Smoker    Packs/day: 0.50    Years: 25.00    Types: Cigarettes  . Smokeless tobacco: Never Used     Comment:  cutting back  . Alcohol use 1.2 oz/week    2 Standard drinks or equivalent per week     Comment: 3-4 days a week.Last drink: today     Allergies   Patient has no known allergies.   Review of Systems Review of Systems  Constitutional: Positive for appetite change (decrease). Negative for fever.  Respiratory: Positive for cough and shortness of breath.   Cardiovascular: Positive for syncope. Negative for chest pain.  Gastrointestinal: Positive for diarrhea. Negative for abdominal pain, blood in stool and vomiting.  Neurological: Positive for headaches. Negative for syncope and numbness.  All other systems reviewed and are negative.  Physical Exam Updated Vital Signs BP (!) 130/95 (BP Location: Left Arm)   Pulse 93   Temp 98.2 F (36.8 C) (Oral)   SpO2 97%   Physical Exam CONSTITUTIONAL: Well developed/well nourished HEAD: Normocephalic/atraumatic EYES: EOMI/PERRL, no nystagmus, no ptosis ENMT: Mucous membranes moist NECK: supple no meningeal signs, no bruits CV: S1/S2 noted, no murmurs/rubs/gallops noted LUNGS: Lungs are clear to auscultation bilaterally, no apparent distress ABDOMEN: soft, nontender, no rebound or guarding GU:no cva tenderness NEURO:Awake/alert, face symmetric, no arm or leg drift is noted Equal 5/5 strength with shoulder abduction, elbow flex/extension, wrist flex/extension in upper extremities and equal hand grips bilaterally Equal 5/5 strength with hip flexion,knee flex/extension, foot dorsi/plantar flexion Cranial nerves 3/4/5/6/12/18/08/11/12 tested and intact Gait normal without ataxia No past pointing Sensation to light touch intact in all extremities EXTREMITIES: pulses normal, full ROM SKIN: warm, color normal PSYCH: no abnormalities of mood noted   ED Treatments / Results  DIAGNOSTIC STUDIES:  Oxygen Saturation is 97% on RA, normal by my interpretation.    COORDINATION OF CARE:  11:47 PM Discussed treatment plan with pt at bedside and  pt agreed to plan.  Labs (all labs ordered are listed, but only abnormal results are displayed) Labs Reviewed  URINALYSIS, ROUTINE W REFLEX MICROSCOPIC - Abnormal; Notable for the following:       Result Value   Protein, ur 30 (*)    All other components within normal limits  BASIC METABOLIC PANEL  CBC    EKG  EKG Interpretation  Date/Time:  Saturday October 07 2016 22:25:12 EDT Ventricular Rate:  92 PR Interval:  118 QRS Duration: 90 QT Interval:  356 QTC Calculation: 440 R Axis:   85 Text Interpretation:  Normal sinus rhythm Right atrial enlargement Left ventricular hypertrophy Abnormal ECG Confirmed by Bebe Shaggy  MD, Teddy Rebstock ([id number]) on 10/07/2016 11:36:13 PM       Radiology Dg Chest 2 View  Result Date: 10/08/2016 CLINICAL DATA:  41 year old male with cough. EXAM: CHEST  2 VIEW COMPARISON:  Chest radiograph dated 08/12/2016 FINDINGS: The heart size and mediastinal contours are within normal limits. Both lungs are clear. The visualized skeletal structures are unremarkable. IMPRESSION: No active cardiopulmonary disease. Electronically Signed   By: Elgie Collard M.D.   On: 10/08/2016 01:02    Procedures Procedures (including critical care time)  Medications Ordered in ED Medications - No data  to display   Initial Impression / Assessment and Plan / ED Course  I have reviewed the triage vital signs and the nursing notes.  Pertinent labs & imaging results that were available during my care of the patient were reviewed by me and considered in my medical decision making (see chart for details).     12:21 AM Pt stable Ambulatory No neuro deficits Pt with what sounds like near syncopal event No EKG changes No new meds, and I reviewed side effects of HIV meds, unlikely this is cause Will check CXR due to cough 1:45 AM cxr negative Pt ambulatory No distress Reports HA only moderate, does not appear c/w SAH or other acute neurologic/infectious etiology Will d/c  home Referred to PCP  Final Clinical Impressions(s) / ED Diagnoses   Final diagnoses:  Near syncope    New Prescriptions New Prescriptions   No medications on file  I personally performed the services described in this documentation, which was scribed in my presence. The recorded information has been reviewed and is accurate.        Zadie Rhine, MD 10/08/16 [phone]

## 2016-10-07 NOTE — ED Triage Notes (Signed)
Pt states he was getting ready for work and felt HA in back of eyes and had some blurred vision; pt states the next thing he remembers is picking himself off the floor; pt states he does not believe it was long but states unwitnessed; pt has hx of HIV with medication management; Pt a&ox 4 on arrival. Pt states slight discomfort behind the eyes on arrival. No obvious distress noted at triage; pt able to ambulated to triage with out assistance.

## 2016-10-08 ENCOUNTER — Emergency Department (HOSPITAL_COMMUNITY): Payer: Self-pay

## 2016-11-09 ENCOUNTER — Ambulatory Visit: Payer: Self-pay | Admitting: Internal Medicine

## 2016-12-18 ENCOUNTER — Ambulatory Visit: Payer: Self-pay | Admitting: Internal Medicine

## 2016-12-26 ENCOUNTER — Ambulatory Visit (INDEPENDENT_AMBULATORY_CARE_PROVIDER_SITE_OTHER): Payer: Self-pay | Admitting: Pharmacist

## 2016-12-26 DIAGNOSIS — B20 Human immunodeficiency virus [HIV] disease: Secondary | ICD-10-CM

## 2016-12-26 LAB — CBC
HCT: 43.6 % (ref 38.5–50.0)
Hemoglobin: 14.5 g/dL (ref 13.2–17.1)
MCH: 30.5 pg (ref 27.0–33.0)
MCHC: 33.3 g/dL (ref 32.0–36.0)
MCV: 91.8 fL (ref 80.0–100.0)
MPV: 9.9 fL (ref 7.5–12.5)
Platelets: 255 10*3/uL (ref 140–400)
RBC: 4.75 MIL/uL (ref 4.20–5.80)
RDW: 14.6 % (ref 11.0–15.0)
WBC: 8.7 10*3/uL (ref 3.8–10.8)

## 2016-12-26 LAB — BASIC METABOLIC PANEL
BUN: 11 mg/dL (ref 7–25)
CALCIUM: 9.2 mg/dL (ref 8.6–10.3)
CO2: 24 mmol/L (ref 20–31)
Chloride: 105 mmol/L (ref 98–110)
Creat: 1.09 mg/dL (ref 0.60–1.35)
GLUCOSE: 87 mg/dL (ref 65–99)
Potassium: 4.1 mmol/L (ref 3.5–5.3)
SODIUM: 140 mmol/L (ref 135–146)

## 2016-12-26 MED ORDER — DARUNAVIR-COBICISTAT 800-150 MG PO TABS: 1.0000 | ORAL_TABLET | Freq: Every day | ORAL | 5 refills | Status: DC

## 2016-12-26 MED ORDER — SULFAMETHOXAZOLE-TRIMETHOPRIM 800-160 MG PO TABS: 1.0000 | ORAL_TABLET | Freq: Every day | ORAL | 5 refills | Status: DC

## 2016-12-26 MED ORDER — ABACAVIR-DOLUTEGRAVIR-LAMIVUD 600-50-300 MG PO TABS: 1.0000 | ORAL_TABLET | Freq: Every day | ORAL | 5 refills | Status: DC

## 2016-12-26 NOTE — Progress Notes (Signed)
HPI: Kurt Foley is a 41 y.o. male who presents to the [facility] pharmacy clinic for HIV follow-up after no-showing for Dr. Luciana Axe 4 times.   Allergies: No Known Allergies  Past Medical History: Past Medical History:  Diagnosis Date  . Depression   . HIV infection [facility])     Social History: Social History   Social History  . Marital status: Married    Spouse name: N/A  . Number of children: N/A  . Years of education: N/A   Social History Main Topics  . Smoking status: Current Every Day Smoker    Packs/day: 0.50    Years: 25.00    Types: Cigarettes  . Smokeless tobacco: Never Used     Comment: cutting back  . Alcohol use 1.2 oz/week    2 Standard drinks or equivalent per week     Comment: 3-4 days a week.Last drink: today  . Drug use: Yes    Types: Marijuana, Cocaine     Comment: Once a week. Last used yesterday.  Cocaine last used today.   Marland Kitchen Sexual activity: Not on file     Comment: encouraged condom use   Other Topics Concern  . Not on file   Social History Narrative  . No narrative on file    Current Regimen: Triumeq + Prezcobix  Labs: HIV 1 RNA Quant (copies/mL)  Date Value  06/15/2016 11,524 (H)  07/29/2015 <20  02/02/2015 <20   CD4 T Cell Abs (/uL)  Date Value  06/15/2016 140 (L)  07/29/2015 120 (L)  03/19/2015 80 (L)   Hep B S Ab (no units)  Date Value  10/30/2013 POS (A)   Hepatitis B Surface Ag (no units)  Date Value  10/30/2013 NEGATIVE   HCV Ab (no units)  Date Value  10/30/2013 NEGATIVE    CrCl: CrCl cannot be calculated (Patient's most recent lab result is older than the maximum 21 days allowed.).  Lipids:    Component Value Date/Time   CHOL 200 07/29/2015 1434   TRIG 169 (H) 07/29/2015 1434   HDL 48 07/29/2015 1434   CHOLHDL 4.2 07/29/2015 1434   VLDL 34 (H) 07/29/2015 1434   LDLCALC 118 07/29/2015 1434    Assessment: Kurt Foley is here today to follow-up for his HIV infection.  He has no showed Dr. Luciana Axe around 4 times  since January.  He has only been seen twice since October 2016.  Asked him where he has been and he states that he has been really stressed out about losing his job recently and housing.  He states he is in a better place now and will start looking for a job soon.  He ran out of medications ~2 weeks ago.  He tells me he was taking both Triumeq + Prezcobix together before he ran out. His ADAP is still active, but he will need to renew now for the next period. His last HIV RNA was taken in January and his CD4 at that time was 140.  He tells me he hasn't been sexually active in a very long time, maybe even years. He has extensive resistance with Y181C, K103N, and E92Q mutations:  Mutations in Bold impact drug susceptibility RT Mutations  K103N, Y181C  PI Mutations   Integrase Mutations  E92Q   Interpretation of Genotype Data per [employer] HIV Database 12/26/16  Nucleoside RTIs  Abacavir - Susceptible Zidovudine - Susceptible Emtricitabine - Susceptible Lamivudine - Susceptible Tenofovir - Susceptible   Non-Nucleoside RTIs  Efavirenz - High level  resistance Etravirine - Intermediate resistance Nevirapine - High level resistance Rilpivirine - Intermediate resistance   Protease Inhibitors     Integrase Inhibitors  Bictegravir - Potential low level resistance Dolutegravir - Potential low level resistance Elvitegravir - High level resistance Raltegravir - Intermediate resistance   I went over these mutations and the resistance associated with them with him in detail.  Told him he needed to stay on top of things and make sure he has his medications and stays in care.  He is working with [employer] he tells me. I will get all labs today and send in refills for Prezcobix and Triumeq.  I will also send in Bactrim and told him to start taking once daily.  I will have him come back and see me in 3-4 weeks and bring documents to renew ADAP at that time.   Plans: - Continue Triumeq PO once daily -  Continue Prezcobix PO once daily - HIV labs with resistance panel - CD4, BMET, RPR, CBC - F/u with me again 01/23/17 at 2pm  Cassie L. Kuppelweiser, PharmD, CPP Infectious Diseases Clinical Pharmacist [facility] 12/26/2016, 3:39 PM

## 2016-12-27 LAB — RPR

## 2016-12-27 LAB — T-HELPER CELL (CD4) - (RCID CLINIC ONLY)
CD4 % Helper T Cell: 11 % — ABNORMAL LOW (ref 33–55)
CD4 T Cell Abs: 160 /uL — ABNORMAL LOW (ref 400–2700)

## 2017-01-02 LAB — HIV RNA, RTPCR W/R GT (RTI, PI,INT)
HIV-1 RNA, QN PCR: 20800 {copies}/mL — AB
HIV-1 RNA, QN PCR: 4.32 {Log_copies}/mL — AB

## 2017-01-09 LAB — HIV-1 GENOTYPE: HIV-1 GENOTYPE: DETECTED — AB

## 2017-01-11 LAB — RFLX HIV-1 INTEGRASE GENOTYPE

## 2017-01-23 ENCOUNTER — Ambulatory Visit: Payer: Self-pay

## 2017-03-08 ENCOUNTER — Telehealth: Payer: Self-pay | Admitting: *Deleted

## 2017-03-08 NOTE — Telephone Encounter (Addendum)
Patient walked into clinic at 12:30, asked to speak with a nurse. He is asking for a letter to have his electricity reconnected. Patient states he was told he could have a letter stating his chronic condition would be worsened if the temperature was >90 and he was without electricity. RN advised that I was unable to write this letter, asked him to come back at 1:45 to speak with Tisha/[facility] or [facility]. Patient did not return after lunch.

## 2017-03-12 ENCOUNTER — Ambulatory Visit: Payer: Self-pay

## 2017-03-13 ENCOUNTER — Encounter: Payer: Self-pay | Admitting: Internal Medicine

## 2017-04-10 ENCOUNTER — Ambulatory Visit: Payer: Self-pay | Admitting: Internal Medicine

## 2017-05-11 IMAGING — DX DG KNEE COMPLETE 4+V*R*
4 series · 4 of 4 positions shown · non-contrast
Comparison: None.

CLINICAL DATA: Medial knee pain after falling on loose rocks.

EXAM:
RIGHT KNEE - COMPLETE 4+ VIEW

[knee ap]
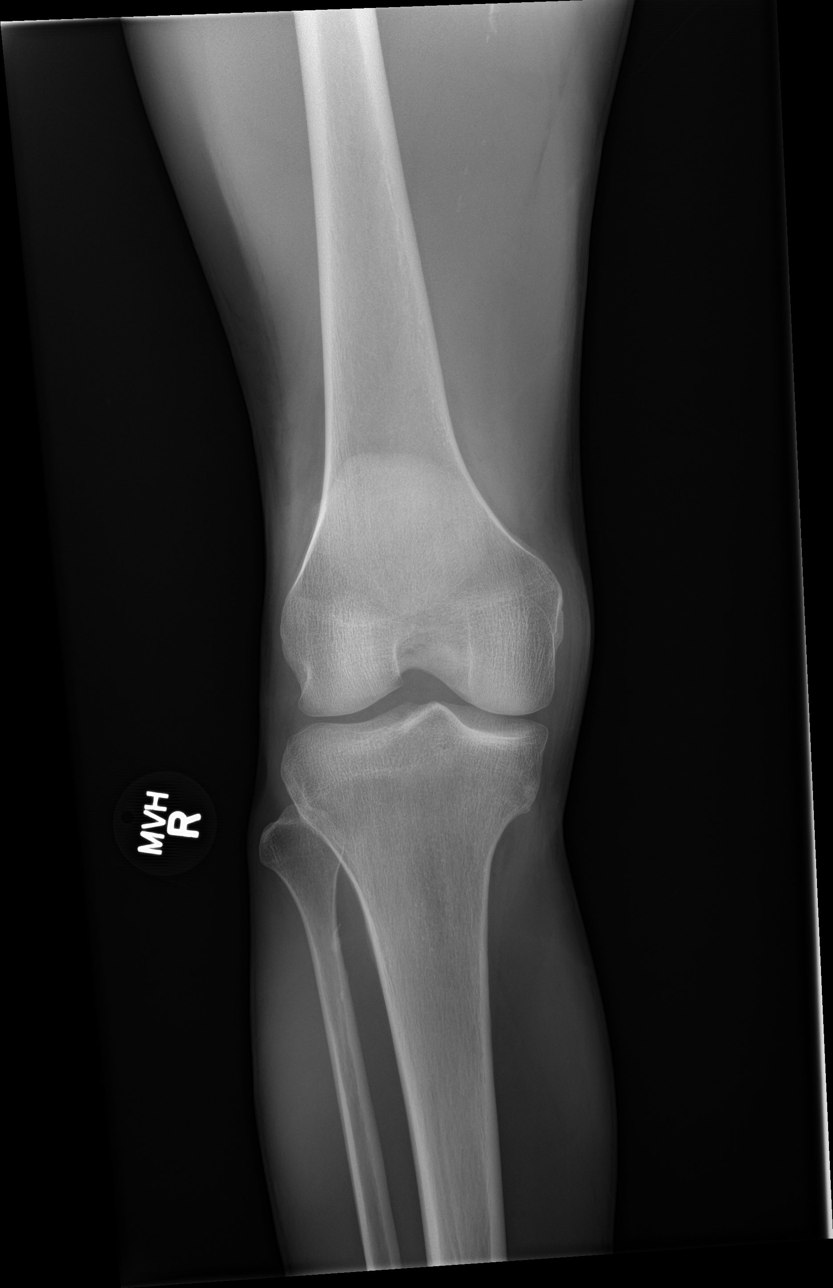

[knee axial]
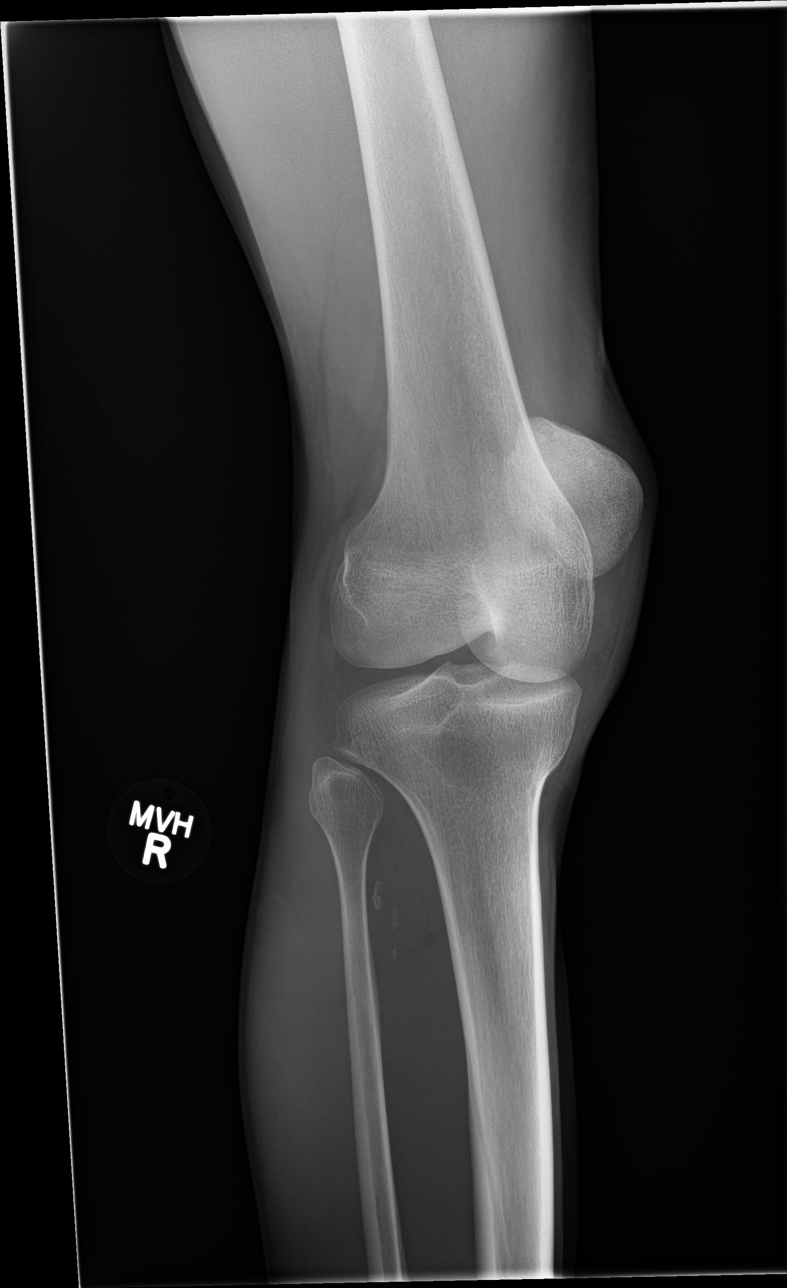

[knee lat]
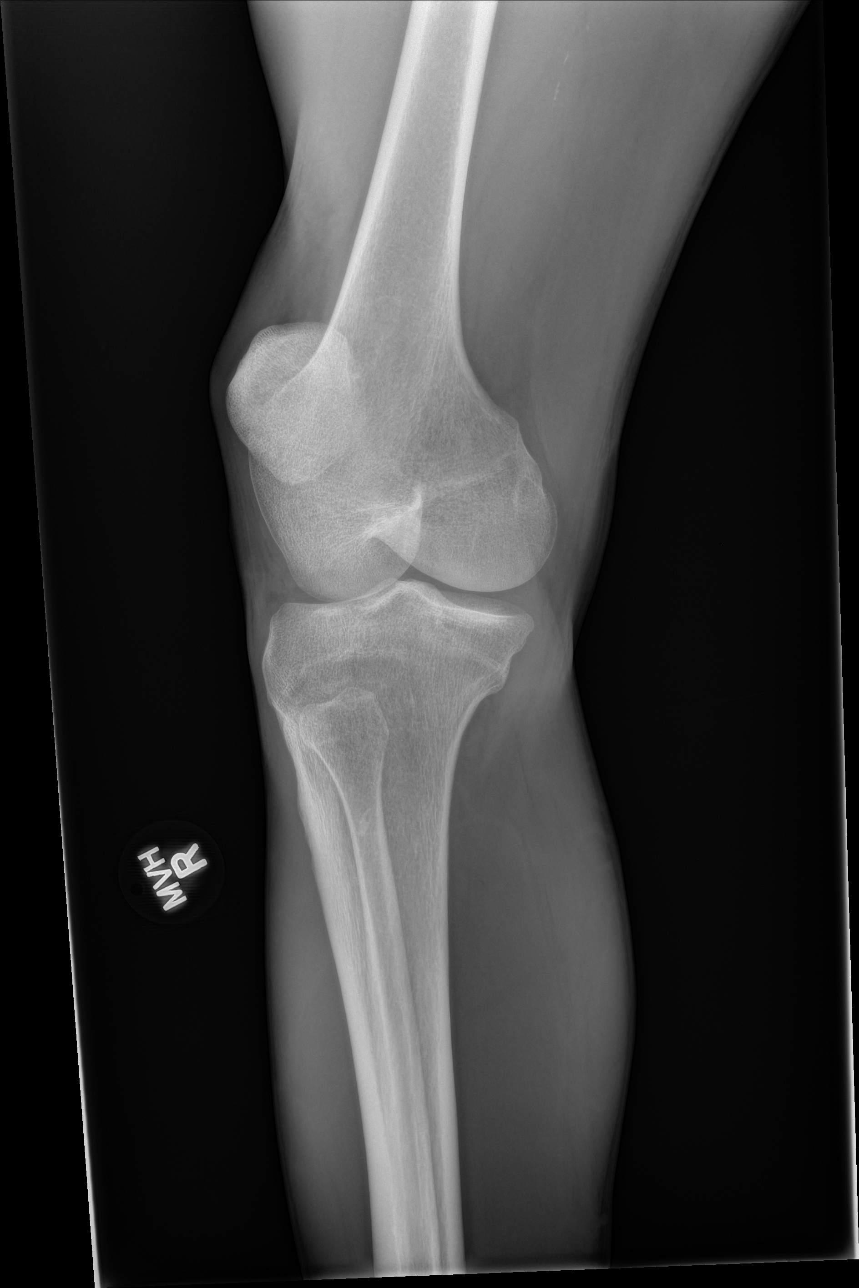

[knee obl]
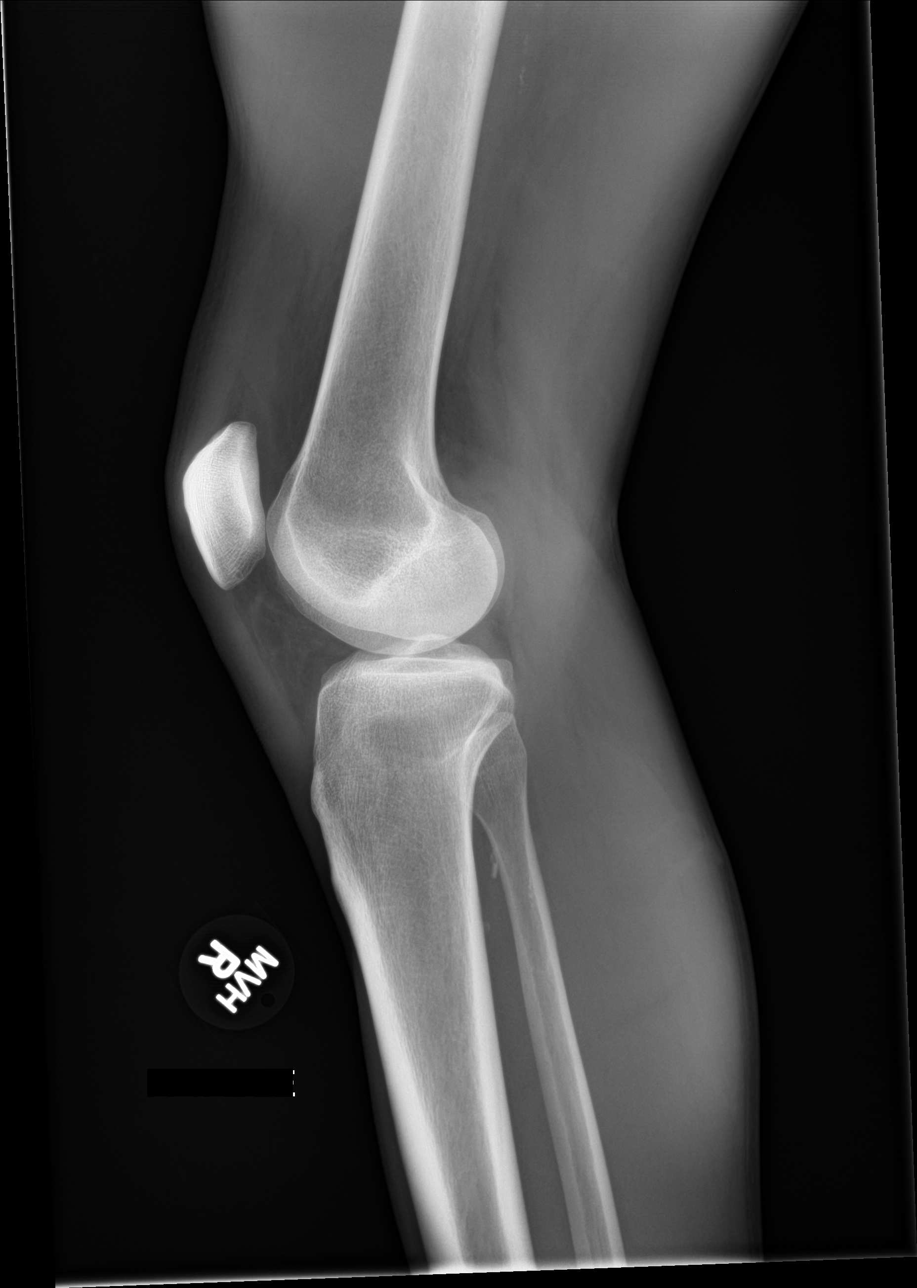

[4 of 4 positions shown; findings below may reference images not displayed]

FINDINGS: There is no evidence of fracture, dislocation, or joint effusion.
There is no evidence of arthropathy or other focal bone abnormality.
Soft tissues are unremarkable.
IMPRESSION: Negative.

## 2017-05-11 IMAGING — CR DG CHEST 2V
2 series · 2 of 2 positions shown · non-contrast
Comparison: 06/17/2014

CLINICAL DATA: Status post fall.

EXAM:
CHEST  2 VIEW

[w chest pa]
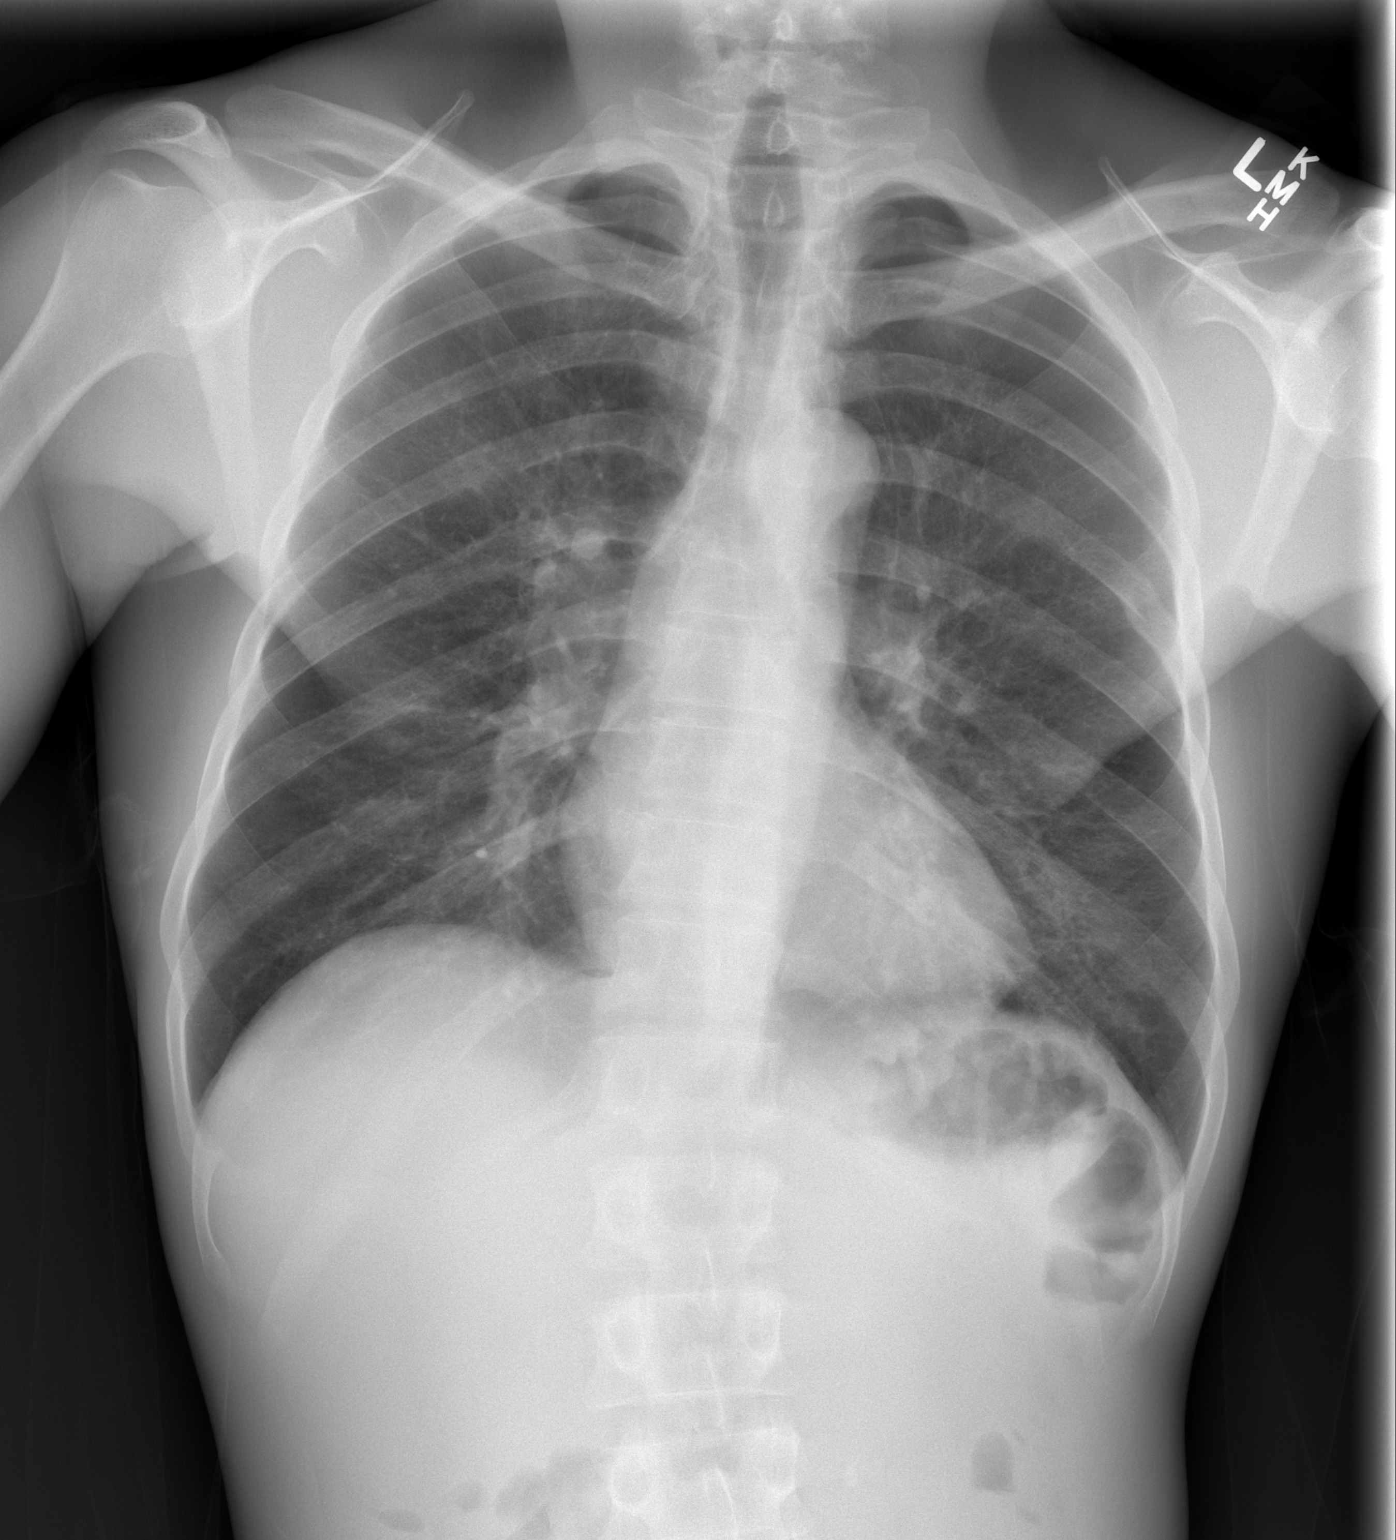

[w chest lat]
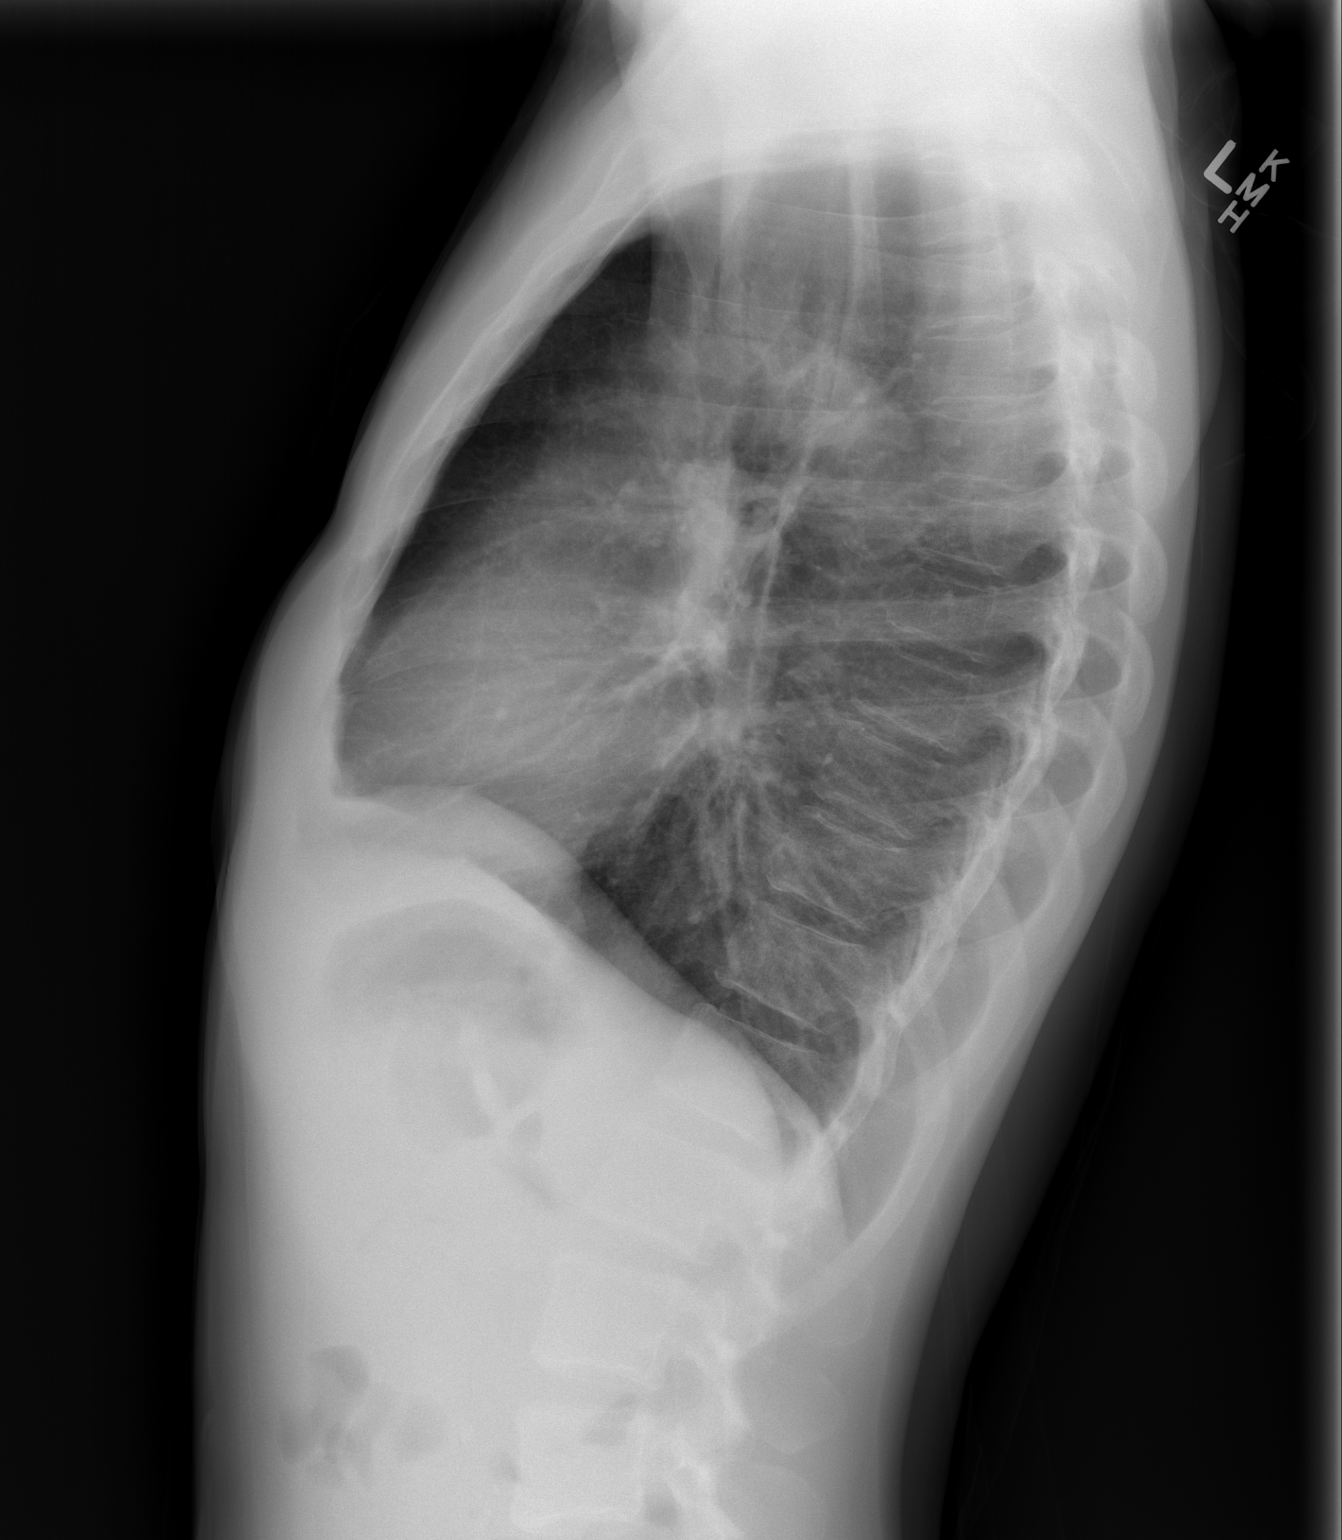

[2 of 2 positions shown; findings below may reference images not displayed]

FINDINGS: Cardiomediastinal silhouette is normal. Mediastinal contours appear
intact.

There is no evidence of focal airspace consolidation, or pleural
effusion. There are mild emphysematous changes. The left superior
lung has hyper lucent appearance, although no clearly defined
pleural line is seen.

Osseous structures are without acute abnormality. Healed left rib
fracture is seen. There is mild dextro convex scoliosis of the
thoracic spine. Soft tissues are grossly normal.
IMPRESSION: Hyperlucent appearance of the left superior lung, with non
visualization of the pleural surface. This may represent a
pneumothorax or bullous changes. Further evaluation with right side
down decubitus radiograph may be considered to differentiate between
the two.

## 2017-06-13 ENCOUNTER — Emergency Department (HOSPITAL_COMMUNITY): Payer: Self-pay

## 2017-06-13 ENCOUNTER — Encounter (HOSPITAL_COMMUNITY): Payer: Self-pay

## 2017-06-13 ENCOUNTER — Other Ambulatory Visit: Payer: Self-pay

## 2017-06-13 ENCOUNTER — Emergency Department (HOSPITAL_COMMUNITY)
Admission: EM | Admit: 2017-06-13 | Discharge: 2017-06-13 | Disposition: A | Discharge status: Home / Self Care | Payer: Self-pay | Attending: Emergency Medicine | Admitting: Emergency Medicine

## 2017-06-13 DIAGNOSIS — F1220 Cannabis dependence, uncomplicated: Secondary | ICD-10-CM | POA: Insufficient documentation

## 2017-06-13 DIAGNOSIS — R531 Weakness: Secondary | ICD-10-CM

## 2017-06-13 DIAGNOSIS — Z21 Asymptomatic human immunodeficiency virus [HIV] infection status: Secondary | ICD-10-CM | POA: Insufficient documentation

## 2017-06-13 DIAGNOSIS — M79675 Pain in left toe(s): Secondary | ICD-10-CM | POA: Insufficient documentation

## 2017-06-13 DIAGNOSIS — F1420 Cocaine dependence, uncomplicated: Secondary | ICD-10-CM | POA: Insufficient documentation

## 2017-06-13 DIAGNOSIS — Z79899 Other long term (current) drug therapy: Secondary | ICD-10-CM | POA: Insufficient documentation

## 2017-06-13 DIAGNOSIS — F17210 Nicotine dependence, cigarettes, uncomplicated: Secondary | ICD-10-CM | POA: Insufficient documentation

## 2017-06-13 DIAGNOSIS — F329 Major depressive disorder, single episode, unspecified: Secondary | ICD-10-CM | POA: Insufficient documentation

## 2017-06-13 DIAGNOSIS — R52 Pain, unspecified: Secondary | ICD-10-CM

## 2017-06-13 LAB — URINALYSIS, ROUTINE W REFLEX MICROSCOPIC
Bilirubin Urine: NEGATIVE
Glucose, UA: NEGATIVE mg/dL
Hgb urine dipstick: NEGATIVE
KETONES UR: NEGATIVE mg/dL
LEUKOCYTES UA: NEGATIVE
NITRITE: NEGATIVE
PH: 5 (ref 5.0–8.0)
Protein, ur: NEGATIVE mg/dL
SPECIFIC GRAVITY, URINE: 1.024 (ref 1.005–1.030)

## 2017-06-13 LAB — HEPATIC FUNCTION PANEL
ALBUMIN: 3.5 g/dL (ref 3.5–5.0)
ALT: 24 U/L (ref 17–63)
AST: 43 U/L — ABNORMAL HIGH (ref 15–41)
Alkaline Phosphatase: 126 U/L (ref 38–126)
BILIRUBIN DIRECT: 0.2 mg/dL (ref 0.1–0.5)
BILIRUBIN INDIRECT: 0.4 mg/dL (ref 0.3–0.9)
BILIRUBIN TOTAL: 0.6 mg/dL (ref 0.3–1.2)
Total Protein: 7.9 g/dL (ref 6.5–8.1)

## 2017-06-13 LAB — CBC
HEMATOCRIT: 42.8 % (ref 39.0–52.0)
Hemoglobin: 14 g/dL (ref 13.0–17.0)
MCH: 29.6 pg (ref 26.0–34.0)
MCHC: 32.7 g/dL (ref 30.0–36.0)
MCV: 90.5 fL (ref 78.0–100.0)
Platelets: 231 10*3/uL (ref 150–400)
RBC: 4.73 MIL/uL (ref 4.22–5.81)
RDW: 13.9 % (ref 11.5–15.5)
WBC: 7.1 10*3/uL (ref 4.0–10.5)

## 2017-06-13 LAB — BASIC METABOLIC PANEL
Anion gap: 8 (ref 5–15)
BUN: 17 mg/dL (ref 6–20)
CO2: 21 mmol/L — ABNORMAL LOW (ref 22–32)
Calcium: 9 mg/dL (ref 8.9–10.3)
Chloride: 106 mmol/L (ref 101–111)
Creatinine, Ser: 1.02 mg/dL (ref 0.61–1.24)
Glucose, Bld: 97 mg/dL (ref 65–99)
POTASSIUM: 4.7 mmol/L (ref 3.5–5.1)
SODIUM: 135 mmol/L (ref 135–145)

## 2017-06-13 MED ORDER — SODIUM CHLORIDE 0.9 % IV BOLUS (SEPSIS)
1000.0000 mL | Freq: Once | INTRAVENOUS | Status: AC
Start: 2017-06-13 — End: 2017-06-13
  Administered 2017-06-13: 1000 mL via INTRAVENOUS

## 2017-06-13 MED ORDER — HYDROCODONE-ACETAMINOPHEN 5-325 MG PO TABS
1.0000 | ORAL_TABLET | Freq: Once | ORAL | Status: AC
  Administered 2017-06-13: 1 via ORAL
  Filled 2017-06-13: qty 1

## 2017-06-13 MED ORDER — MORPHINE SULFATE (PF) 4 MG/ML IV SOLN
4.0000 mg | Freq: Once | INTRAVENOUS | Status: AC
  Administered 2017-06-13: 4 mg via INTRAVENOUS
  Filled 2017-06-13: qty 1

## 2017-06-13 MED ORDER — HYDROCODONE-ACETAMINOPHEN 5-325 MG PO TABS: 1.0000 | ORAL_TABLET | Freq: Four times a day (QID) | ORAL | 0 refills | Status: DC | PRN

## 2017-06-13 NOTE — Discharge Instructions (Signed)
Take your HIV medicines as prescribed.   Take motrin for pain. Take vicodin for severe pain.   See your foot doctor and Dr. Luciana Axeomer from ID.   Return to ER if you have worse foot pain, chest pain, trouble breathing, weakness, fever.

## 2017-06-13 NOTE — ED Triage Notes (Signed)
Per Pt, Pt is coming from home with complaints of generalized weakness for the past six months. Pt has been on HIV medication for 20 years, but reports not being on them and restarting them six months ago. PT reports that he has just felt fatigue and weak since being on the medicine. Reports SOB. Denies any Chest pain. Also complains of left toe swelling and pain.

## 2017-06-13 NOTE — ED Notes (Signed)
Pt verbalized d.c instructions, no further questions. Pt ambulatory upon d.c

## 2017-06-13 NOTE — ED Provider Notes (Signed)
[facility]   CSN: [id number] Arrival date & time: 06/13/17  1412     History   Chief Complaint Chief Complaint  Patient presents with  . Weakness  . Toe Pain    HPI Kurt Foley is a 42 y.o. male hx of HIV (CD 4 160 in 7/18), here presenting with weakness, fatigue, left toe pain.  States that he has been compliant with his HIV medicines but states that they cause him to be fatigued and weak.  He has some generalized weakness.  Also has cough for several days.  He also states that for the last several weeks he has intermittent left fourth and fifth toe swelling and pain. He took ibuprofen with no relief.   The history is provided by the patient.    Past Medical History:  Diagnosis Date  . Depression   . HIV infection [facility](HCC)     Patient Active Problem List   Diagnosis Date Noted  . Substance induced mood disorder (HCC) 09/16/2015  . Cocaine dependence with cocaine-induced mood disorder (HCC)   . Cannabis use disorder, severe, dependence (HCC) 04/09/2015  . Severe recurrent major depression without psychotic features (HCC) 04/08/2015  . Alcohol abuse 04/08/2015  . Screening examination for venereal disease 03/25/2015  . Encounter for long-term (current) use of medications 03/25/2015  . Tobacco abuse   . Visual disturbance 11/18/2013  . Human immunodeficiency virus (HIV) disease (HCC) 10/30/2013  . Atopic dermatitis 10/30/2013    History reviewed. No pertinent surgical history.     Home Medications    Prior to Admission medications   Medication Sig Start Date End Date Taking? Authorizing Provider  abacavir-dolutegravir-lamiVUDine (TRIUMEQ) 600-50-300 MG tablet Take 1 tablet by mouth daily. 12/26/16  Yes Kuppelweiser, Cassie L, RPH-CPP  darunavir-cobicistat (PREZCOBIX) 800-150 MG tablet Take 1 tablet by mouth daily with breakfast. 12/26/16  Yes Kuppelweiser, Cassie L, RPH-CPP  gabapentin (NEURONTIN) 300 MG  capsule Take 1 capsule (300 mg total) by mouth 3 (three) times daily. 10/12/15  Yes Adonis BrookAgustin, Sheila, NP  lactobacillus acidophilus & bulgar (LACTINEX) chewable tablet Chew 1 tablet by mouth 3 (three) times daily with meals. 07/11/16  Yes Antony MaduraHumes, Kelly, PA-C  mirtazapine (REMERON) 7.5 MG tablet Take 1 tablet (7.5 mg total) by mouth at bedtime. 10/12/15  Yes Adonis BrookAgustin, Sheila, NP  QUEtiapine (SEROQUEL) 200 MG tablet Take 1 tablet (200 mg total) by mouth at bedtime. 10/12/15  Yes Adonis BrookAgustin, Sheila, NP  sulfamethoxazole-trimethoprim (BACTRIM DS,SEPTRA DS) 800-160 MG tablet Take 1 tablet by mouth daily. 12/26/16  Yes Kuppelweiser, Cassie L, RPH-CPP  loperamide (IMODIUM) 2 MG capsule Take 1 capsule (2 mg total) by mouth every 8 (eight) hours as needed for diarrhea or loose stools. Patient not taking: Reported on 06/13/2017 07/11/16   Antony MaduraHumes, Kelly, PA-C    Family History Family History  Problem Relation Age of Onset  . Diabetes Mother   . Hypertension Mother   . Arthritis Mother     Social History Social History   Tobacco Use  . Smoking status: Current Every Day Smoker    Packs/day: 0.50    Years: 25.00    Pack years: 12.50    Types: Cigarettes  . Smokeless tobacco: Never Used  . Tobacco comment: cutting back  Substance Use Topics  . Alcohol use: Yes    Alcohol/week: 1.2 oz    Types: 2 Standard drinks or equivalent per week    Comment: 3-4 days a week.Last drink: today  . Drug  use: Yes    Types: Marijuana, Cocaine    Comment: Once a week. Last used yesterday.  Cocaine last used today.      Allergies   Patient has no known allergies.   Review of Systems Review of Systems  Musculoskeletal:       L 4th and 5th toe pain   Neurological: Positive for weakness.  All other systems reviewed and are negative.    Physical Exam Updated Vital Signs BP 121/90   Pulse 72   Temp 98 F (36.7 C) (Oral)   Resp 16   Ht 5\' 6"  (1.676 m)   Wt 56.7 kg (125 lb)   SpO2 98%   BMI 20.18 kg/m   Physical  Exam  Constitutional: He is oriented to person, place, and time. He appears well-developed.  HENT:  Head: Normocephalic.  Mouth/Throat: Oropharynx is clear and moist.  Eyes: Conjunctivae and EOM are normal. Pupils are equal, round, and reactive to light.  Neck: Normal range of motion. Neck supple.  Cardiovascular: Normal rate, regular rhythm and normal heart sounds.  Pulmonary/Chest: Effort normal and breath sounds normal. No stridor. No respiratory distress. He has no wheezes.  Abdominal: Soft. Bowel sounds are normal. He exhibits no distension. There is no tenderness.  Musculoskeletal:  L 4th and 5th toes tender and slightly swollen, no obvious deformity   Neurological: He is alert and oriented to person, place, and time.  Skin: Skin is warm.  Psychiatric: He has a normal mood and affect.  Nursing note and vitals reviewed.    ED Treatments / Results  Labs (all labs ordered are listed, but only abnormal results are displayed) Labs Reviewed  BASIC METABOLIC PANEL - Abnormal; Notable for the following components:      Result Value   CO2 21 (*)    All other components within normal limits  HEPATIC FUNCTION PANEL - Abnormal; Notable for the following components:   AST 43 (*)    All other components within normal limits  CBC  URINALYSIS, ROUTINE W REFLEX MICROSCOPIC    EKG  EKG Interpretation  Date/Time:  Wednesday June 13 2017 14:24:52 EST Ventricular Rate:  89 PR Interval:  110 QRS Duration: 90 QT Interval:  366 QTC Calculation: 445 R Axis:   90 Text Interpretation:  Sinus rhythm with short PR Right atrial enlargement Rightward axis Left ventricular hypertrophy Abnormal ECG No significant change since last tracing Confirmed by Margarita Grizzle [phone]) on 06/13/2017 2:28:36 PM       Radiology Dg Chest 2 View  Result Date: 06/13/2017 CLINICAL DATA:  Cough. Generalize weakness for 6 months. HIV medication for 20 years. Stopped medications and then restarted than 6 months  ago. Fatigue and weakness since restarting medication. Shortness of breath. EXAM: CHEST  2 VIEW COMPARISON:  10/08/2016 FINDINGS: Hyperinflation. Normal heart size and pulmonary vascularity. No focal airspace disease or consolidation in the lungs. No blunting of costophrenic angles. No pneumothorax. Mediastinal contours appear intact. Old left rib fracture. IMPRESSION: No active cardiopulmonary disease. Electronically Signed   By: Burman Nieves M.D.   On: 06/13/2017 21:09   Dg Foot Complete Left  Result Date: 06/13/2017 CLINICAL DATA:  Generalize weakness for 6 months. HIV medications. Left toe swelling and pain. EXAM: LEFT FOOT - COMPLETE 3+ VIEW COMPARISON:  None. FINDINGS: Deformities of the left fifth toe proximal and distal phalanges and the fourth toe proximal phalanx probably represent congenital deformities. No definite acute fracture or dislocation. No expansile or destructive bone lesions. Soft tissues  are unremarkable. No radiopaque soft tissue foreign bodies or soft tissue gas. 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 No definite acute fracture or dislocation. No destructive bone lesions. Electronically Signed   By: Burman Nieves M.D.   On: 06/13/2017 21:12    Procedures Procedures (including critical care time)  Medications Ordered in ED Medications  sodium chloride 0.9 % bolus 1,000 mL (0 mLs Intravenous Stopped 06/13/17 2211)  morphine 4 MG/ML injection 4 mg (4 mg Intravenous Given 06/13/17 2122)     Initial Impression / Assessment and Plan / ED Course  I have reviewed the triage vital signs and the nursing notes.  Pertinent labs & imaging results that were available during my care of the patient were reviewed by me and considered in my medical decision making (see chart for details).     Kurt Foley is a 42 y.o. male here with weakness, l 4th and 5th toe pain. Hx of HIV and compliant with meds. Afebrile, vitals stable. Will get labs,  xray foot.   11:32 PM Labs unremarkable. Xray showed possible congenital deformity with no fractures. I told him to take his meds as prescribed, will refer to podiatry and give short course of vicodin for pain.    Final Clinical Impressions(s) / ED Diagnoses   Final diagnoses:  Pain    ED Discharge Orders    None       Charlynne Pander, MD 06/13/17 2333

## 2017-06-13 NOTE — Care Management (Signed)
ED CM consulted concerning patient needing medication assistance. CM met with patient, he reports being followed at the [facility]  clinic by [name], patient is currently on HIV meds and has been for the past 20 years.  Patient wants to be restart  BH medications, CM discussed [facility],  Patient aware of program. CM provided information for the Open Access Clinic at [facility]. Patient verbalized understanding and teach back done. No further ED CM needs identified.

## 2017-06-27 ENCOUNTER — Emergency Department (HOSPITAL_COMMUNITY)
Admission: EM | Admit: 2017-06-27 | Discharge: 2017-06-27 | Disposition: A | Discharge status: Home / Self Care | Payer: Self-pay | Attending: Emergency Medicine | Admitting: Emergency Medicine

## 2017-06-27 ENCOUNTER — Encounter (HOSPITAL_COMMUNITY): Payer: Self-pay | Admitting: Nurse Practitioner

## 2017-06-27 ENCOUNTER — Other Ambulatory Visit: Payer: Self-pay

## 2017-06-27 DIAGNOSIS — R109 Unspecified abdominal pain: Secondary | ICD-10-CM | POA: Insufficient documentation

## 2017-06-27 DIAGNOSIS — Z5321 Procedure and treatment not carried out due to patient leaving prior to being seen by health care provider: Secondary | ICD-10-CM | POA: Insufficient documentation

## 2017-06-27 LAB — COMPREHENSIVE METABOLIC PANEL
ALT: 18 U/L (ref 17–63)
AST: 51 U/L — AB (ref 15–41)
Albumin: 3.6 g/dL (ref 3.5–5.0)
Alkaline Phosphatase: 115 U/L (ref 38–126)
Anion gap: 10 (ref 5–15)
BUN: 11 mg/dL (ref 6–20)
CO2: 20 mmol/L — AB (ref 22–32)
CREATININE: 0.89 mg/dL (ref 0.61–1.24)
Calcium: 8.8 mg/dL — ABNORMAL LOW (ref 8.9–10.3)
Chloride: 106 mmol/L (ref 101–111)
GFR calc Af Amer: >60 mL/min (ref >60)
GFR calc non Af Amer: >60 mL/min (ref >60)
Glucose, Bld: 79 mg/dL (ref 65–99)
Potassium: 4.6 mmol/L (ref 3.5–5.1)
SODIUM: 136 mmol/L (ref 135–145)
Total Bilirubin: 0.7 mg/dL (ref 0.3–1.2)
Total Protein: 8 g/dL (ref 6.5–8.1)

## 2017-06-27 LAB — CBC
HEMATOCRIT: 41.9 % (ref 39.0–52.0)
Hemoglobin: 14.6 g/dL (ref 13.0–17.0)
MCH: 31.6 pg (ref 26.0–34.0)
MCHC: 34.8 g/dL (ref 30.0–36.0)
MCV: 90.7 fL (ref 78.0–100.0)
PLATELETS: 207 10*3/uL (ref 150–400)
RBC: 4.62 MIL/uL (ref 4.22–5.81)
RDW: 14.2 % (ref 11.5–15.5)
WBC: 5.9 10*3/uL (ref 4.0–10.5)

## 2017-06-27 LAB — LIPASE, BLOOD: Lipase: 34 U/L (ref 11–51)

## 2017-06-27 NOTE — ED Notes (Signed)
Pt seen walking out of the department.   

## 2017-06-27 NOTE — ED Notes (Signed)
Lab work, radiology results and vital signs reviewed, no critical results at this time, no change in acuity indicated.  

## 2017-06-27 NOTE — ED Triage Notes (Signed)
Pt endorses midabdominal pain started last night radiates up and down abdomen. Pt sts pain is worse with movement. Pt has not tried anything. Pt denies nausea, vomiting, diarrhea. Pt sts last BM was yesterday. Pt sts had one beer earlier and had steak and potatoes for dinner.

## 2018-11-24 ENCOUNTER — Encounter (HOSPITAL_COMMUNITY): Payer: Self-pay

## 2018-11-24 ENCOUNTER — Other Ambulatory Visit: Payer: Self-pay

## 2018-11-24 ENCOUNTER — Emergency Department (HOSPITAL_COMMUNITY)
Admission: EM | Admit: 2018-11-24 | Discharge: 2018-11-25 | Disposition: A | Discharge status: Other Acute Inpatient Hospital | Payer: Self-pay | Attending: Emergency Medicine | Admitting: Emergency Medicine

## 2018-11-24 DIAGNOSIS — F332 Major depressive disorder, recurrent severe without psychotic features: Secondary | ICD-10-CM | POA: Diagnosis present

## 2018-11-24 DIAGNOSIS — F17210 Nicotine dependence, cigarettes, uncomplicated: Secondary | ICD-10-CM | POA: Insufficient documentation

## 2018-11-24 DIAGNOSIS — F1410 Cocaine abuse, uncomplicated: Secondary | ICD-10-CM | POA: Diagnosis present

## 2018-11-24 DIAGNOSIS — R45850 Homicidal ideations: Secondary | ICD-10-CM | POA: Insufficient documentation

## 2018-11-24 DIAGNOSIS — Z21 Asymptomatic human immunodeficiency virus [HIV] infection status: Secondary | ICD-10-CM | POA: Insufficient documentation

## 2018-11-24 DIAGNOSIS — F1420 Cocaine dependence, uncomplicated: Secondary | ICD-10-CM | POA: Insufficient documentation

## 2018-11-24 DIAGNOSIS — R45851 Suicidal ideations: Secondary | ICD-10-CM

## 2018-11-24 DIAGNOSIS — Z03818 Encounter for observation for suspected exposure to other biological agents ruled out: Secondary | ICD-10-CM | POA: Insufficient documentation

## 2018-11-24 DIAGNOSIS — Z79899 Other long term (current) drug therapy: Secondary | ICD-10-CM | POA: Insufficient documentation

## 2018-11-24 DIAGNOSIS — F333 Major depressive disorder, recurrent, severe with psychotic symptoms: Secondary | ICD-10-CM | POA: Insufficient documentation

## 2018-11-24 LAB — CBC
HCT: 39.8 % (ref 39.0–52.0)
Hemoglobin: 13 g/dL (ref 13.0–17.0)
MCH: 30.7 pg (ref 26.0–34.0)
MCHC: 32.7 g/dL (ref 30.0–36.0)
MCV: 93.9 fL (ref 80.0–100.0)
Platelets: 201 10*3/uL (ref 150–400)
RBC: 4.24 MIL/uL (ref 4.22–5.81)
RDW: 15.2 % (ref 11.5–15.5)
WBC: 2.8 10*3/uL — ABNORMAL LOW (ref 4.0–10.5)
nRBC: 0 % (ref 0.0–0.2)

## 2018-11-24 LAB — COMPREHENSIVE METABOLIC PANEL
ALT: 20 U/L (ref 0–44)
AST: 51 U/L — ABNORMAL HIGH (ref 15–41)
Albumin: 3.6 g/dL (ref 3.5–5.0)
Alkaline Phosphatase: 130 U/L — ABNORMAL HIGH (ref 38–126)
Anion gap: 9 (ref 5–15)
BUN: 13 mg/dL (ref 6–20)
CO2: 23 mmol/L (ref 22–32)
Calcium: 9.1 mg/dL (ref 8.9–10.3)
Chloride: 106 mmol/L (ref 98–111)
Creatinine, Ser: 0.78 mg/dL (ref 0.61–1.24)
GFR calc Af Amer: >60 mL/min (ref >60)
GFR calc non Af Amer: >60 mL/min (ref >60)
Glucose, Bld: 107 mg/dL — ABNORMAL HIGH (ref 70–99)
Potassium: 4.4 mmol/L (ref 3.5–5.1)
Sodium: 138 mmol/L (ref 135–145)
Total Bilirubin: 0.3 mg/dL (ref 0.3–1.2)
Total Protein: 8.3 g/dL — ABNORMAL HIGH (ref 6.5–8.1)

## 2018-11-24 LAB — SALICYLATE LEVEL: Salicylate Lvl: <7 mg/dL (ref 2.8–30.0)

## 2018-11-24 LAB — ETHANOL: Alcohol, Ethyl (B): <10 mg/dL (ref <10)

## 2018-11-24 LAB — ACETAMINOPHEN LEVEL: Acetaminophen (Tylenol), Serum: <10 ug/mL — ABNORMAL LOW (ref 10–30)

## 2018-11-24 NOTE — ED Triage Notes (Addendum)
Patient states he is suicidal but denies a plan. Patient states he hears voices that are telling him that people are talking about him and then he wants to hurt that person. Patient states he drank alcohol, smoked marijuana, cocaine , and crack 2 days ago.  Patient states he has not had his HIV meds in a years and his psych meds x 2 weeks. Patient states, when I do not take my meds regularly I get to where I do not care any more.  Patient states, "I'm just tired."  Patient also c/o bilateral feet swelling and painful bilateral pinky toes.
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
## 2018-11-24 NOTE — ED Notes (Signed)
Bed: [facility] Expected date:  Expected time:  Means of arrival:  Comments: 

## 2018-11-24 NOTE — ED Provider Notes (Signed)
[facility]-EMERGENCY DEPT Provider Note   CSN: [id number] Arrival date & time: 11/24/18  1300     History   Chief Complaint Chief Complaint  Patient presents with  . Suicidal    HPI Kurt Foley is a 43 y.o. male.     HPI Pt was seen at 1430. Per pt, c/o gradual onset and worsening of persistent depression and SI for the past several days. Pt states he has been out of his psych meds for the past 2 weeks. LD etoh, marijuana, cocaine, crack was 2 days ago. Pt states he "doesn't care anymore." Endorses auditory hallucinations that tell him people are talking about him, then he wants to hurt that person. Denies SA.   Past Medical History:  Diagnosis Date  . Depression   . HIV infection [facility](HCC)     Patient Active Problem List   Diagnosis Date Noted  . Substance induced mood disorder (HCC) 09/16/2015  . Cocaine dependence with cocaine-induced mood disorder (HCC)   . Cannabis use disorder, severe, dependence (HCC) 04/09/2015  . Severe recurrent major depression without psychotic features (HCC) 04/08/2015  . Alcohol abuse 04/08/2015  . Screening examination for venereal disease 03/25/2015  . Encounter for long-term (current) use of medications 03/25/2015  . Tobacco abuse   . Visual disturbance 11/18/2013  . Human immunodeficiency virus (HIV) disease (HCC) 10/30/2013  . Atopic dermatitis 10/30/2013    History reviewed. No pertinent surgical history.      Home Medications    Prior to Admission medications   Medication Sig Start Date End Date Taking? Authorizing Provider  abacavir-dolutegravir-lamiVUDine (TRIUMEQ) 600-50-300 MG tablet Take 1 tablet by mouth daily. 12/26/16   Kuppelweiser, Cassie L, RPH-CPP  darunavir-cobicistat (PREZCOBIX) 800-150 MG tablet Take 1 tablet by mouth daily with breakfast. 12/26/16   Kuppelweiser, Cassie L, RPH-CPP  gabapentin (NEURONTIN) 300 MG capsule Take 1 capsule (300 mg total) by mouth 3 (three) times daily. 10/12/15    Adonis BrookAgustin, Sheila, NP  HYDROcodone-acetaminophen (NORCO/VICODIN) 5-325 MG tablet Take 1 tablet by mouth every 6 (six) hours as needed. 06/13/17   Charlynne PanderYao, David Hsienta, MD  lactobacillus acidophilus & bulgar (LACTINEX) chewable tablet Chew 1 tablet by mouth 3 (three) times daily with meals. 07/11/16   Antony MaduraHumes, Kelly, PA-C  loperamide (IMODIUM) 2 MG capsule Take 1 capsule (2 mg total) by mouth every 8 (eight) hours as needed for diarrhea or loose stools. Patient not taking: Reported on 06/13/2017 07/11/16   Antony MaduraHumes, Kelly, PA-C  mirtazapine (REMERON) 7.5 MG tablet Take 1 tablet (7.5 mg total) by mouth at bedtime. 10/12/15   Adonis BrookAgustin, Sheila, NP  QUEtiapine (SEROQUEL) 200 MG tablet Take 1 tablet (200 mg total) by mouth at bedtime. 10/12/15   Adonis BrookAgustin, Sheila, NP  sulfamethoxazole-trimethoprim (BACTRIM DS,SEPTRA DS) 800-160 MG tablet Take 1 tablet by mouth daily. 12/26/16   Kuppelweiser, Cassie L, RPH-CPP    Family History Family History  Problem Relation Age of Onset  . Diabetes Mother   . Hypertension Mother   . Arthritis Mother     Social History Social History   Tobacco Use  . Smoking status: Current Every Day Smoker    Packs/day: 0.50    Years: 25.00    Pack years: 12.50    Types: Cigarettes  . Smokeless tobacco: Never Used  . Tobacco comment: cutting back  Substance Use Topics  . Alcohol use: Yes    Alcohol/week: 2.0 standard drinks    Types: 2 Standard drinks or equivalent per week  Comment: 3-4 days a week.Last drink: today  . Drug use: Yes    Types: Marijuana, Cocaine    Comment: Once a week. Last used yesterday.  Cocaine last used today.      Allergies   Patient has no known allergies.   Review of Systems Review of Systems ROS: Statement: All systems negative except as marked or noted in the HPI; Constitutional: Negative for fever and chills. ; ; Eyes: Negative for eye pain, redness and discharge. ; ; ENMT: Negative for ear pain, hoarseness, nasal congestion, sinus pressure and sore  throat. ; ; Cardiovascular: Negative for chest pain, palpitations, diaphoresis, dyspnea and peripheral edema. ; ; Respiratory: Negative for cough, wheezing and stridor. ; ; Gastrointestinal: Negative for nausea, vomiting, diarrhea, abdominal pain, blood in stool, hematemesis, jaundice and rectal bleeding. . ; ; Genitourinary: Negative for dysuria, flank pain and hematuria. ; ; Musculoskeletal: Negative for back pain and neck pain. Negative for swelling and trauma.; ; Skin: Negative for pruritus, rash, abrasions, blisters, bruising and skin lesion.; ; Neuro: Negative for headache, lightheadedness and neck stiffness. Negative for weakness, altered level of consciousness, altered mental status, extremity weakness, paresthesias, involuntary movement, seizure and syncope.; Psych:  +SI, +auditory hallucinations. No SA.        Physical Exam Updated Vital Signs BP (!) 122/93 (BP Location: Right Arm)   Pulse 92   Temp 98.4 F (36.9 C) (Oral)   Resp 17   SpO2 98%   Physical Exam 1435: Physical examination:  Nursing notes reviewed; Vital signs and O2 SAT reviewed;  Constitutional: Well developed, Well nourished, Well hydrated, In no acute distress; Head:  Normocephalic, atraumatic; Eyes: EOMI, PERRL, No scleral icterus; ENMT: Mouth and pharynx normal, Mucous membranes moist; Neck: Supple, Full range of motion; Cardiovascular: Regular rate and rhythm; Respiratory: Breath sounds clear, No wheezes.  Speaking full sentences with ease, Normal respiratory effort/excursion; Chest: No deformity, Movement normal; Abdomen: Nondistended; Extremities: No deformity.; Neuro: AA&Ox3, Major CN grossly intact.  Speech clear. No gross focal motor deficits in extremities. Climbs on and off stretcher easily by himself. Gait steady.; Skin: Color normal, Warm, Dry.; Psych:  Affect flat. Endorses SI.    ED Treatments / Results  Labs (all labs ordered are listed, but only abnormal results are displayed)   EKG    Radiology    Procedures Procedures (including critical care time)  Medications Ordered in ED Medications - No data to display   Initial Impression / Assessment and Plan / ED Course  I have reviewed the triage vital signs and the nursing notes.  Pertinent labs & imaging results that were available during my care of the patient were reviewed by me and considered in my medical decision making (see chart for details).     MDM Reviewed: previous chart, nursing note and vitals Reviewed previous: labs Interpretation: labs     Results for orders placed or performed during the hospital encounter of 11/24/18  Comprehensive metabolic panel  Result Value Ref Range   Sodium 138 135 - 145 mmol/L   Potassium 4.4 3.5 - 5.1 mmol/L   Chloride 106 98 - 111 mmol/L   CO2 23 22 - 32 mmol/L   Glucose, Bld 107 (H) 70 - 99 mg/dL   BUN 13 6 - 20 mg/dL   Creatinine, Ser 6.960.78 0.61 - 1.24 mg/dL   Calcium 9.1 8.9 - 29.510.3 mg/dL   Total Protein 8.3 (H) 6.5 - 8.1 g/dL   Albumin 3.6 3.5 - 5.0 g/dL   AST  51 (H) 15 - 41 U/L   ALT 20 0 - 44 U/L   Alkaline Phosphatase 130 (H) 38 - 126 U/L   Total Bilirubin 0.3 0.3 - 1.2 mg/dL   GFR calc non Af Amer >60 >60 mL/min   GFR calc Af Amer >60 >60 mL/min   Anion gap 9 5 - 15  Ethanol  Result Value Ref Range   Alcohol, Ethyl (B) <94 <85 mg/dL  Salicylate level  Result Value Ref Range   Salicylate Lvl <4.6 2.8 - 30.0 mg/dL  Acetaminophen level  Result Value Ref Range   Acetaminophen (Tylenol), Serum <10 (L) 10 - 30 ug/mL  cbc  Result Value Ref Range   WBC 2.8 (L) 4.0 - 10.5 K/uL   RBC 4.24 4.22 - 5.81 MIL/uL   Hemoglobin 13.0 13.0 - 17.0 g/dL   HCT 39.8 39.0 - 52.0 %   MCV 93.9 80.0 - 100.0 fL   MCH 30.7 26.0 - 34.0 pg   MCHC 32.7 30.0 - 36.0 g/dL   RDW 15.2 11.5 - 15.5 %   Platelets 201 150 - 400 K/uL   nRBC 0.0 0.0 - 0.2 %    1755:  Labs per baseline. Will have TTS evaluate.   2045:  TTS has evaluated pt: placement pending. Holding orders written.      Final Clinical Impressions(s) / ED Diagnoses   Final diagnoses:  None    ED Discharge Orders    None       Francine Graven, DO 11/24/18 2056

## 2018-11-24 NOTE — ED Notes (Signed)
His sitter remains with him at all times.

## 2018-11-25 ENCOUNTER — Inpatient Hospital Stay (HOSPITAL_COMMUNITY)
Admission: AD | Admit: 2018-11-25 | Discharge: 2018-11-28 | DRG: 885 | Disposition: A | Discharge status: Home / Self Care | Payer: Federal, State, Local not specified - Other | Source: Intra-hospital | Attending: Psychiatry | Admitting: Psychiatry

## 2018-11-25 ENCOUNTER — Encounter (HOSPITAL_COMMUNITY): Payer: Self-pay | Admitting: *Deleted

## 2018-11-25 ENCOUNTER — Other Ambulatory Visit: Payer: Self-pay

## 2018-11-25 DIAGNOSIS — F17210 Nicotine dependence, cigarettes, uncomplicated: Secondary | ICD-10-CM | POA: Diagnosis present

## 2018-11-25 DIAGNOSIS — Z91411 Personal history of adult psychological abuse: Secondary | ICD-10-CM

## 2018-11-25 DIAGNOSIS — F1490 Cocaine use, unspecified, uncomplicated: Secondary | ICD-10-CM | POA: Diagnosis present

## 2018-11-25 DIAGNOSIS — F1290 Cannabis use, unspecified, uncomplicated: Secondary | ICD-10-CM | POA: Diagnosis present

## 2018-11-25 DIAGNOSIS — Z91410 Personal history of adult physical and sexual abuse: Secondary | ICD-10-CM | POA: Diagnosis not present

## 2018-11-25 DIAGNOSIS — Z915 Personal history of self-harm: Secondary | ICD-10-CM

## 2018-11-25 DIAGNOSIS — Z716 Tobacco abuse counseling: Secondary | ICD-10-CM

## 2018-11-25 DIAGNOSIS — F332 Major depressive disorder, recurrent severe without psychotic features: Secondary | ICD-10-CM

## 2018-11-25 DIAGNOSIS — Z20828 Contact with and (suspected) exposure to other viral communicable diseases: Secondary | ICD-10-CM | POA: Diagnosis present

## 2018-11-25 DIAGNOSIS — B20 Human immunodeficiency virus [HIV] disease: Secondary | ICD-10-CM | POA: Diagnosis present

## 2018-11-25 DIAGNOSIS — R45851 Suicidal ideations: Secondary | ICD-10-CM | POA: Diagnosis present

## 2018-11-25 DIAGNOSIS — F1410 Cocaine abuse, uncomplicated: Secondary | ICD-10-CM | POA: Diagnosis present

## 2018-11-25 DIAGNOSIS — Z79899 Other long term (current) drug therapy: Secondary | ICD-10-CM | POA: Diagnosis not present

## 2018-11-25 DIAGNOSIS — Z590 Homelessness: Secondary | ICD-10-CM

## 2018-11-25 LAB — RAPID URINE DRUG SCREEN, HOSP PERFORMED
Amphetamines: NOT DETECTED
Barbiturates: NOT DETECTED
Benzodiazepines: NOT DETECTED
Cocaine: POSITIVE — AB
Opiates: NOT DETECTED
Tetrahydrocannabinol: POSITIVE — AB

## 2018-11-25 LAB — SARS CORONAVIRUS 2 BY RT PCR (HOSPITAL ORDER, PERFORMED IN ~~LOC~~ HOSPITAL LAB): SARS Coronavirus 2: NEGATIVE

## 2018-11-25 MED ORDER — LACTINEX PO CHEW
1.0000 | CHEWABLE_TABLET | Freq: Three times a day (TID) | ORAL | Status: DC
  Administered 2018-11-26 – 2018-11-28 (×8): 1 via ORAL
  Filled 2018-11-25 (×17): qty 1

## 2018-11-25 MED ORDER — ACETAMINOPHEN 325 MG PO TABS
650.0000 mg | ORAL_TABLET | ORAL | Status: DC | PRN
  Administered 2018-11-27 – 2018-11-28 (×4): 650 mg via ORAL
  Filled 2018-11-25 (×3): qty 2

## 2018-11-25 MED ORDER — MIRTAZAPINE 7.5 MG PO TABS: 7.5000 mg | ORAL_TABLET | Freq: Every day | ORAL | Status: DC

## 2018-11-25 MED ORDER — QUETIAPINE FUMARATE 100 MG PO TABS: 200.0000 mg | ORAL_TABLET | Freq: Every day | ORAL | Status: DC

## 2018-11-25 MED ORDER — ALUM & MAG HYDROXIDE-SIMETH 200-200-20 MG/5ML PO SUSP: 30.0000 mL | Freq: Four times a day (QID) | ORAL | Status: DC | PRN

## 2018-11-25 MED ORDER — MAGNESIUM HYDROXIDE 400 MG/5ML PO SUSP: 30.0000 mL | Freq: Every day | ORAL | Status: DC | PRN

## 2018-11-25 MED ORDER — LACTINEX PO CHEW
1.0000 | CHEWABLE_TABLET | Freq: Three times a day (TID) | ORAL | Status: DC
  Administered 2018-11-25: 18:00:00 1 via ORAL
  Filled 2018-11-25: qty 1

## 2018-11-25 MED ORDER — QUETIAPINE FUMARATE 200 MG PO TABS
200.0000 mg | ORAL_TABLET | Freq: Every day | ORAL | Status: DC
  Administered 2018-11-25 – 2018-11-27 (×3): 200 mg via ORAL
  Filled 2018-11-25 (×5): qty 1

## 2018-11-25 MED ORDER — MIRTAZAPINE 7.5 MG PO TABS
7.5000 mg | ORAL_TABLET | Freq: Every day | ORAL | Status: DC
  Administered 2018-11-25 – 2018-11-27 (×3): 7.5 mg via ORAL
  Filled 2018-11-25 (×5): qty 1

## 2018-11-25 MED ORDER — ACETAMINOPHEN 325 MG PO TABS
650.0000 mg | ORAL_TABLET | ORAL | Status: DC | PRN
  Administered 2018-11-25: 04:00:00 650 mg via ORAL
  Filled 2018-11-25: qty 2

## 2018-11-25 NOTE — Progress Notes (Signed)
Nursing Progress Note: 7p-7a D: Pt currently presents with a anxious/pleasant affect and behavior. Interacting appropriately with the milieu. Pt reports fair sleep during the previous night with current medication regimen.   A: Pt provided with medications per providers orders. Pt's labs and vitals were monitored throughout the night. Pt supported emotionally and encouraged to express concerns and questions. Pt educated on medications.  R: Pt's safety ensured with 15 minute and environmental checks. Pt currently denies SI, HI, and AVH. Pt verbally contracts to seek staff if SI,HI, or AVH occurs and to consult with staff before acting on any harmful thoughts. Will continue to monitor.    [place] NOVEL CORONAVIRUS (COVID-19) DAILY CHECK-OFF SYMPTOMS - answer yes or no to each - every day NO YES  Have you had a fever in the past 24 hours?  . Fever (Temp > 37.80C / 100F) X   Have you had any of these symptoms in the past 24 hours? . New Cough .  Sore Throat  .  Shortness of Breath .  Difficulty Breathing .  Unexplained Body Aches   X   Have you had any one of these symptoms in the past 24 hours not related to allergies?   . Runny Nose .  Nasal Congestion .  Sneezing   X   If you have had runny nose, nasal congestion, sneezing in the past 24 hours, has it worsened?  X   EXPOSURES - check yes or no X   Have you traveled outside the state in the past 14 days?  X   Have you been in contact with someone with a confirmed diagnosis of COVID-19 or PUI in the past 14 days without wearing appropriate PPE?  X   Have you been living in the same home as a person with confirmed diagnosis of COVID-19 or a PUI (household contact)?    X   Have you been diagnosed with COVID-19?    X              What to do next: Answered NO to all: Answered YES to anything:   Proceed with unit schedule Follow the [facility] Inpatient Flowsheet.      

## 2018-11-25 NOTE — ED Notes (Signed)
Pt discharged safely with [place] driver.  All belongings were sent with patient. 

## 2018-11-25 NOTE — Tx Team (Signed)
Initial Treatment Plan 11/25/2018 6:31 PM Kurt Foley FSE:[id number]    PATIENT STRESSORS: Health problems Medication change or noncompliance Substance abuse   PATIENT STRENGTHS: Ability for insight Average or above average intelligence Capable of independent living General fund of knowledge   PATIENT IDENTIFIED PROBLEMS: Depression Suicidal thoughts "Get back on my medicine"                     DISCHARGE CRITERIA:  Ability to meet basic life and health needs Improved stabilization in mood, thinking, and/or behavior Reduction of life-threatening or endangering symptoms to within safe limits Verbal commitment to aftercare and medication compliance  PRELIMINARY DISCHARGE PLAN: Attend aftercare/continuing care group  PATIENT/FAMILY INVOLVEMENT: This treatment plan has been presented to and reviewed with the patient, Kurt Foley, and/or family member, .  The patient and family have been given the opportunity to ask questions and make suggestions.  Mana Haberl, [place], [place] 11/25/2018, 6:31 PM

## 2018-11-25 NOTE — BH Assessment (Signed)
[facility] Assessment Progress Note  Per Buford Dresser, DO, this pt requires psychiatric hospitalization at this time.  Letitia Libra, RN, [facility] has assigned pt to [facility] Rm [id number]; [facility] will be ready to receive pt at 17:00, and after Covid-19 test has been done.  Pt has signed Voluntary Admission and Consent for Treatment, as well as Consent to Release Information to [facility] and to the [facility] and [facility], and a notification call has been placed to the former.  Signed forms have been faxed to [facility].  Pt's nurse, Diane, has been notified, and agrees to send original paperwork along with pt via [facility], and to call report to [phone].  Jalene Mullet, [facility] Coordinator [phone]

## 2018-11-25 NOTE — Progress Notes (Signed)
Kurt Foley is a 43 year old male pt admitted on voluntary basis. On admission, he endorses depression and suicidal thoughts but is able to contract for safety while in the hospital. He reports that he has been off his medications for the past month or so but would like to get back on them. He does endorse some substance abuse issues. He reports that he was going to [facility] for medication management. He reports that he is unsure of where he will go once he is discharged but did mention that he has family in [place] that he could stay with but does not have transportation there. Kurt Foley was escorted to the unit, oriented to the milieu and safety maintained.

## 2018-11-25 NOTE — ED Notes (Signed)
Bed: WA30 Expected date:  Expected time:  Means of arrival:  Comments: 
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
## 2018-11-26 DIAGNOSIS — F332 Major depressive disorder, recurrent severe without psychotic features: Principal | ICD-10-CM

## 2018-11-26 LAB — T-HELPER CELLS (CD4) COUNT (NOT AT ARMC)
CD4 % Helper T Cell: 1 % — ABNORMAL LOW (ref 33–65)
CD4 T Cell Abs: <35 /uL — ABNORMAL LOW (ref 400–1790)

## 2018-11-26 MED ORDER — GABAPENTIN 300 MG PO CAPS
300.0000 mg | ORAL_CAPSULE | Freq: Three times a day (TID) | ORAL | Status: DC
  Administered 2018-11-26 – 2018-11-28 (×7): 300 mg via ORAL
  Filled 2018-11-26 (×12): qty 1

## 2018-11-26 MED ORDER — ENSURE ENLIVE PO LIQD
237.0000 mL | Freq: Two times a day (BID) | ORAL | Status: DC
  Administered 2018-11-27 – 2018-11-28 (×2): 237 mL via ORAL
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
## 2018-11-26 NOTE — Plan of Care (Addendum)
Patient was drowsy upon approach. Denies SI HI AVH, did not answer most of writer's questions. Patient was anxious to get back to bed. Patient is compliant with medications. No side effects noted. Safety is maintained with 15 minute checks as well as environmental checks. Will continue to monitor and provide support.  Problem: Education: Goal: Knowledge of [facility] Education information/materials will improve Outcome: Progressing Goal: Emotional status will improve Outcome: Progressing Goal: Mental status will improve Outcome: Progressing Goal: Verbalization of understanding the information provided will improve Outcome: Progressing

## 2018-11-26 NOTE — [facility] Group Notes (Signed)
[facility] Group Therapy 11/26/2018 2:53 PM  Type of Therapy: Mental Health Association Presentation  Participation Level: Did not attend.    Jayleigh Notarianni, MSW, LCSWA 11/26/2018 2:53 PM 

## 2018-11-26 NOTE — Progress Notes (Signed)
NUTRITION ASSESSMENT  Pt identified as at risk on the Malnutrition Screen Tool  INTERVENTION: 1. Supplements: Ensure Enlive po BID, each supplement provides 350 kcal and 20 grams of protein  NUTRITION DIAGNOSIS: Unintentional weight loss related to sub-optimal intake as evidenced by pt report.   Goal: Pt to meet >/= 90% of their estimated nutrition needs.  Monitor:  PO intake  Assessment:  Pt admitted for depression, substance abuse and HIV. Pt reports not eating well PTA and has had weight loss. Per weight records, pt has lost 14 lbs since January 2019 (11% wt loss x 1.5 years, insignificant for time frame). Will order Ensure supplements given weight loss and underweight status.   Height: Ht Readings from Last 1 Encounters:  11/25/18 5\' 6"  (1.676 m)    Weight: Wt Readings from Last 1 Encounters:  11/25/18 49.9 kg    Weight Hx: Wt Readings from Last 10 Encounters:  11/25/18 49.9 kg  06/13/17 56.7 kg  08/12/16 54.4 kg  06/15/16 56.2 kg  10/09/15 56.2 kg  09/14/15 51.3 kg  04/07/15 53.5 kg  04/06/15 52.2 kg  03/25/15 54.4 kg  03/20/15 51.9 kg    BMI:  Body mass index is 17.75 kg/m. Pt meets criteria for underweight based on current BMI.  Estimated Nutritional Needs: Kcal: 25-30 kcal/kg Protein: > 1 gram protein/kg Fluid: 1 ml/kcal  Diet Order:  Diet Order            Diet Heart Room service appropriate? Yes; Fluid consistency: Thin  Diet effective now             Pt is also offered choice of unit snacks mid-morning and mid-afternoon.  Pt is eating as desired.   Lab results and medications reviewed.   Clayton Bibles, MS, RD, [place] Dietitian Pager: [phone] After Hours Pager: [phone]

## 2018-11-26 NOTE — Progress Notes (Signed)
Nursing Progress Note: 7p-7a D: Pt currently presents with a anxious/pleasant affect and behavior. Interacting appropriately with the milieu. Pt reports fair sleep during the previous night with current medication regimen.   A: Pt provided with medications per providers orders. Pt's labs and vitals were monitored throughout the night. Pt supported emotionally and encouraged to express concerns and questions. Pt educated on medications.  R: Pt's safety ensured with 15 minute and environmental checks. Pt currently denies SI, HI, and AVH. Pt verbally contracts to seek staff if SI,HI, or AVH occurs and to consult with staff before acting on any harmful thoughts. Will continue to monitor.    [place] NOVEL CORONAVIRUS (COVID-19) DAILY CHECK-OFF SYMPTOMS - answer yes or no to each - every day NO YES  Have you had a fever in the past 24 hours?  . Fever (Temp > 37.80C / 100F) X   Have you had any of these symptoms in the past 24 hours? . New Cough .  Sore Throat  .  Shortness of Breath .  Difficulty Breathing .  Unexplained Body Aches   X   Have you had any one of these symptoms in the past 24 hours not related to allergies?   . Runny Nose .  Nasal Congestion .  Sneezing   X   If you have had runny nose, nasal congestion, sneezing in the past 24 hours, has it worsened?  X   EXPOSURES - check yes or no X   Have you traveled outside the state in the past 14 days?  X   Have you been in contact with someone with a confirmed diagnosis of COVID-19 or PUI in the past 14 days without wearing appropriate PPE?  X   Have you been living in the same home as a person with confirmed diagnosis of COVID-19 or a PUI (household contact)?    X   Have you been diagnosed with COVID-19?    X              What to do next: Answered NO to all: Answered YES to anything:   Proceed with unit schedule Follow the [facility] Inpatient Flowsheet.

## 2018-11-27 NOTE — Progress Notes (Signed)
Nursing Progress Note: 7p-7a D: Pt currently presents with a depressed/sad/brightens on approach affect and behavior. Pt states "I want to get better. I just don't know where to begin. I want to get back on my HIV meds." Interacting appropriately with the milieu. Pt reports good sleep during the previous night with current medication regimen.   A: Pt provided with medications per providers orders. Pt's labs and vitals were monitored throughout the night. Pt supported emotionally and encouraged to express concerns and questions. Pt educated on medications.  R: Pt's safety ensured with 15 minute and environmental checks. Pt currently denies SI, HI, and AVH. Pt verbally contracts to seek staff if SI,HI, or AVH occurs and to consult with staff before acting on any harmful thoughts. Will continue to monitor.   [place] NOVEL CORONAVIRUS (COVID-19) DAILY CHECK-OFF SYMPTOMS - answer yes or no to each - every day NO YES  Have you had a fever in the past 24 hours?  . Fever (Temp > 37.80C / 100F) X   Have you had any of these symptoms in the past 24 hours? . New Cough .  Sore Throat  .  Shortness of Breath .  Difficulty Breathing .  Unexplained Body Aches   X   Have you had any one of these symptoms in the past 24 hours not related to allergies?   . Runny Nose .  Nasal Congestion .  Sneezing   X   If you have had runny nose, nasal congestion, sneezing in the past 24 hours, has it worsened?  X   EXPOSURES - check yes or no X   Have you traveled outside the state in the past 14 days?  X   Have you been in contact with someone with a confirmed diagnosis of COVID-19 or PUI in the past 14 days without wearing appropriate PPE?  X   Have you been living in the same home as a person with confirmed diagnosis of COVID-19 or a PUI (household contact)?    X   Have you been diagnosed with COVID-19?    X              What to do next: Answered NO to all: Answered YES to anything:   Proceed with unit  schedule Follow the [facility] Inpatient Flowsheet.   \

## 2018-11-27 NOTE — Progress Notes (Signed)
[facility]  11/27/2018 9:58 AM Kurt Foley  MRN:  [id number] Subjective:   Patient in bed not really participating he states he is not doing any better but gives me no specifics he denies suicidal thoughts or psychosis considered to be malingering due to homelessness or at the very least issues of secondary gain as he is resistant to discharge planning Principal Problem: Homelessness cocaine and cannabis abuse Diagnosis: Active Problems:   Major depressive disorder, recurrent severe without psychotic features (HCC)  Total Time spent with patient: 20 minutes  Past Medical History:  Past Medical History:  Diagnosis Date  . Depression   . HIV infection (HCC)    History reviewed. No pertinent surgical history. Family History:  Family History  Problem Relation Age of Onset  . Diabetes Mother   . Hypertension Mother   . Arthritis Mother     Social History:  Social History   Substance and Sexual Activity  Alcohol Use Yes  . Alcohol/week: 2.0 standard drinks  . Types: 2 Standard drinks or equivalent per week   Comment: 3-4 days a week.Last drink: today     Social History   Substance and Sexual Activity  Drug Use Yes  . Types: Marijuana, Cocaine   Comment: Once a week. Last used yesterday.  Cocaine last used today.     Social History   Socioeconomic History  . Marital status: Married    Spouse name: Not on file  . Number of children: Not on file  . Years of education: Not on file  . Highest education level: Not on file  Occupational History  . Not on file  Social Needs  . Financial resource strain: Not on file  . Food insecurity    Worry: Not on file    Inability: Not on file  . Transportation needs    Medical: Not on file    Non-medical: Not on file  Tobacco Use  . Smoking status: Current Every Day Smoker    Packs/day: 0.50    Years: 25.00    Pack years: 12.50    Types: Cigarettes  . Smokeless tobacco: Never Used  . Tobacco comment: cutting  back  Substance and Sexual Activity  . Alcohol use: Yes    Alcohol/week: 2.0 standard drinks    Types: 2 Standard drinks or equivalent per week    Comment: 3-4 days a week.Last drink: today  . Drug use: Yes    Types: Marijuana, Cocaine    Comment: Once a week. Last used yesterday.  Cocaine last used today.   Marland Kitchen. Sexual activity: Not Currently    Partners: Male    Comment: encouraged condom use  Lifestyle  . Physical activity    Days per week: Not on file    Minutes per session: Not on file  . Stress: Not on file  Relationships  . Social Musicianconnections    Talks on phone: Not on file    Gets together: Not on file    Attends religious service: Not on file    Active member of club or organization: Not on file    Attends meetings of clubs or organizations: Not on file    Relationship status: Not on file  Other Topics Concern  . Not on file  Social History Narrative  . Not on file   Additional Social History:                         Sleep:  Good  Appetite:  Good  Current Medications: Current Facility-Administered Medications  Medication Dose Route Frequency Provider Last Rate Last Dose  . acetaminophen (TYLENOL) tablet 650 mg  650 mg Oral Q4H PRN Charm RingsLord, Jamison Y, NP   650 mg at 11/27/18 0816  . alum & mag hydroxide-simeth (MAALOX/MYLANTA) 200-200-20 MG/5ML suspension 30 mL  30 mL Oral Q6H PRN Charm RingsLord, Jamison Y, NP      . feeding supplement (ENSURE ENLIVE) (ENSURE ENLIVE) liquid 237 mL  237 mL Oral BID BM [name], Lilliann Rossetti, MD   237 mL at 11/27/18 0817  . gabapentin (NEURONTIN) capsule 300 mg  300 mg Oral TID [name], Jenavie Stanczak, MD   300 mg at 11/27/18 0816  . lactobacillus acidophilus & bulgar (LACTINEX) chewable tablet 1 tablet  1 tablet Oral TID WC Charm RingsLord, Jamison Y, NP   1 tablet at 11/27/18 0816  . magnesium hydroxide (MILK OF MAGNESIA) suspension 30 mL  30 mL Oral Daily PRN Charm RingsLord, Jamison Y, NP      . mirtazapine (REMERON) tablet 7.5 mg  7.5 mg Oral QHS Charm RingsLord, Jamison Y, NP   7.5 mg  at 11/26/18 2156  . QUEtiapine (SEROQUEL) tablet 200 mg  200 mg Oral QHS Charm RingsLord, Jamison Y, NP   200 mg at 11/26/18 2156    Lab Results:  Results for orders placed or performed during the hospital encounter of 11/25/18 (from the past 48 hour(s))  T-helper cells (CD4) count (not at [facility])     Status: Abnormal   Collection Time: 11/25/18  6:24 PM  Result Value Ref Range   CD4 T Cell Abs <35 (L) 400 - 1,790 /uL   CD4 % Helper T Cell 1 (L) 33 - 65 %    Comment: Performed at [facility], [address] [place], [place] [address]    Blood Alcohol level:  Lab Results  Component Value Date   ETH <10 11/24/2018   ETH 6 (H) 10/09/2015    Metabolic Disorder Labs: No results found for: HGBA1C, MPG No results found for: PROLACTIN Lab Results  Component Value Date   CHOL 200 07/29/2015   TRIG 169 (H) 07/29/2015   HDL 48 07/29/2015   CHOLHDL 4.2 07/29/2015   VLDL 34 (H) 07/29/2015   LDLCALC 118 07/29/2015   LDLCALC 74 10/30/2013    Physical Findings: AIMS: Facial and Oral Movements Muscles of Facial Expression: None, normal Lips and Perioral Area: None, normal Jaw: None, normal Tongue: None, normal,Extremity Movements Upper (arms, wrists, hands, fingers): None, normal Lower (legs, knees, ankles, toes): None, normal, Trunk Movements Neck, shoulders, hips: None, normal, Overall Severity Severity of abnormal movements (highest score from questions above): None, normal Incapacitation due to abnormal movements: None, normal Patient's awareness of abnormal movements (rate only patient's report): No Awareness, Dental Status Current problems with teeth and/or dentures?: No Does patient usually wear dentures?: No  CIWA:    COWS:     Musculoskeletal: Strength & Muscle Tone: within normal limits Gait & Station: normal Patient leans: N/A  Psychiatric Specialty Exam: Physical Exam  ROS  Blood pressure 119/90, pulse (!) 115, temperature 97.8 F (36.6 C), temperature  source Oral, resp. rate 20, height 5\' 6"  (1.676 m), weight 49.9 kg, SpO2 100 %.Body mass index is 17.75 kg/m.  General Appearance: Casual  Eye Contact:  Minimal  Speech:  Slow  Volume:  Decreased  Mood:  Angry and Irritable  Affect:  Appropriate  Thought Process: Early goal-directed with intact associations  Orientation:  Full (Time, Place,  and Person)  Thought Content:  Logical  Suicidal Thoughts:  No  Homicidal Thoughts:  No  Memory:  Immediate;   Fair  Judgement:  Fair  Insight:  Fair  Psychomotor Activity:  Normal  Concentration:  Concentration: Fair  Recall:  [employer] of Knowledge:  Fair  Language:  Fair  Akathisia:  Negative  Handed:  Right  AIMS (if indicated):     Assets:  Physical Health Resilience  ADL's:  Intact  Cognition:  WNL  Sleep:  Number of Hours: 6.25     Treatment Plan Summary: Daily contact with patient to assess and evaluate symptoms and progress in treatment and Medication management monitor noted 24 hours encouraged to find housing or some resources no change in meds or precautions  Kurt Nazar, MD 11/27/2018, 9:58 AM

## 2018-11-27 NOTE — Tx Team (Signed)
Interdisciplinary Treatment and Diagnostic Plan Update  11/27/2018 Time of Session: 0937 Kurt SolianReginald K Foley MRN: [id number]  Principal Diagnosis: <principal problem not specified>  Secondary Diagnoses: Active Problems:   Major depressive disorder, recurrent severe without psychotic features (HCC)   Current Medications:  Current Facility-Administered Medications  Medication Dose Route Frequency Provider Last Rate Last Dose  . acetaminophen (TYLENOL) tablet 650 mg  650 mg Oral Q4H PRN Kurt Foley, Kurt Y, NP   650 mg at 11/27/18 0816  . alum & mag hydroxide-simeth (MAALOX/MYLANTA) 200-200-20 MG/5ML suspension 30 mL  30 mL Oral Q6H PRN Kurt Foley, Kurt Y, NP      . feeding supplement (ENSURE ENLIVE) (ENSURE ENLIVE) liquid 237 mL  237 mL Oral BID BM Kurt Foley, Brian, MD   237 mL at 11/27/18 0817  . gabapentin (NEURONTIN) capsule 300 mg  300 mg Oral TID Kurt Foley, Brian, MD   300 mg at 11/27/18 0816  . lactobacillus acidophilus & bulgar (LACTINEX) chewable tablet 1 tablet  1 tablet Oral TID WC Kurt Foley, Kurt Y, NP   1 tablet at 11/27/18 0816  . magnesium hydroxide (MILK OF MAGNESIA) suspension 30 mL  30 mL Oral Daily PRN Kurt Foley, Kurt Y, NP      . mirtazapine (REMERON) tablet 7.5 mg  7.5 mg Oral QHS Kurt Foley, Kurt Y, NP   7.5 mg at 11/26/18 2156  . QUEtiapine (SEROQUEL) tablet 200 mg  200 mg Oral QHS Kurt Foley, Kurt Y, NP   200 mg at 11/26/18 2156   PTA Medications: Medications Prior to Admission  Medication Sig Dispense Refill Last Dose  . abacavir-dolutegravir-lamiVUDine (TRIUMEQ) 600-50-300 MG tablet Take 1 tablet by mouth daily. 30 tablet 5   . darunavir-cobicistat (PREZCOBIX) 800-150 MG tablet Take 1 tablet by mouth daily with breakfast. 30 tablet 5   . lactobacillus acidophilus & bulgar (LACTINEX) chewable tablet Chew 1 tablet by mouth 3 (three) times daily with meals. (Patient not taking: Reported on 11/24/2018) 21 tablet 0   . loperamide (IMODIUM) 2 MG capsule Take 1 capsule (2 mg total) by mouth every 8  (eight) hours as needed for diarrhea or loose stools. (Patient not taking: Reported on 06/13/2017) 10 capsule 0   . mirtazapine (REMERON) 7.5 MG tablet Take 1 tablet (7.5 mg total) by mouth at bedtime. (Patient not taking: Reported on 11/24/2018) 30 tablet 0   . QUEtiapine (SEROQUEL) 200 MG tablet Take 1 tablet (200 mg total) by mouth at bedtime. (Patient not taking: Reported on 11/24/2018) 30 tablet 0   . sulfamethoxazole-trimethoprim (BACTRIM DS,SEPTRA DS) 800-160 MG tablet Take 1 tablet by mouth daily. (Patient not taking: Reported on 11/24/2018) 30 tablet 5     Patient Stressors: Health problems Medication change or noncompliance Substance abuse  Patient Strengths: Ability for insight Average or above average intelligence Capable of independent living General fund of knowledge  Treatment Modalities: Medication Management, Group therapy, Case management,  1 to 1 session with clinician, Psychoeducation, Recreational therapy.   Physician Treatment Plan for Primary Diagnosis: <principal problem not specified> Long Term Goal(s): Improvement in symptoms so as ready for discharge Improvement in symptoms so as ready for discharge   Short Term Goals: Compliance with prescribed medications will improve Ability to identify triggers associated with substance abuse/mental health issues will improve Ability to maintain clinical measurements within normal limits will improve Compliance with prescribed medications will improve  Medication Management: Evaluate patient's response, side effects, and tolerance of medication regimen.  Therapeutic Interventions: 1 to 1 sessions, Unit Group sessions and Medication administration.  Evaluation of Outcomes: Progressing  Physician Treatment Plan for Secondary Diagnosis: Active Problems:   Major depressive disorder, recurrent severe without psychotic features ([place])  Long Term Goal(s): Improvement in symptoms so as ready for discharge Improvement in symptoms  so as ready for discharge   Short Term Goals: Compliance with prescribed medications will improve Ability to identify triggers associated with substance abuse/mental health issues will improve Ability to maintain clinical measurements within normal limits will improve Compliance with prescribed medications will improve     Medication Management: Evaluate patient's response, side effects, and tolerance of medication regimen.  Therapeutic Interventions: 1 to 1 sessions, Unit Group sessions and Medication administration.  Evaluation of Outcomes: Progressing   RN Treatment Plan for Primary Diagnosis: <principal problem not specified> Long Term Goal(s): Knowledge of disease and therapeutic regimen to maintain health will improve  Short Term Goals: Ability to identify and develop effective coping behaviors will improve and Compliance with prescribed medications will improve  Medication Management: RN will administer medications as ordered by provider, will assess and evaluate patient's response and provide education to patient for prescribed medication. RN will report any adverse and/or side effects to prescribing provider.  Therapeutic Interventions: 1 on 1 counseling sessions, Psychoeducation, Medication administration, Evaluate responses to treatment, Monitor vital signs and CBGs as ordered, Perform/monitor CIWA, COWS, AIMS and Fall Risk screenings as ordered, Perform wound care treatments as ordered.  Evaluation of Outcomes: Progressing   LCSW Treatment Plan for Primary Diagnosis: <principal problem not specified> Long Term Goal(s): Safe transition to appropriate next level of care at discharge, Engage patient in therapeutic group addressing interpersonal concerns.  Short Term Goals: Engage patient in aftercare planning with referrals and resources, Increase social support and Increase skills for wellness and recovery  Therapeutic Interventions: Assess for all discharge needs, 1 to 1  time with Social worker, Explore available resources and support systems, Assess for adequacy in community support network, Educate family and significant other(s) on suicide prevention, Complete Psychosocial Assessment, Interpersonal group therapy.  Evaluation of Outcomes: Progressing   Progress in Treatment: Attending groups: No. Participating in groups: No. Taking medication as prescribed: Yes. Toleration medication: Yes. Family/Significant other contact made: No, will contact:  godmother Patient understands diagnosis: Yes. Discussing patient identified problems/goals with staff: Yes. Medical problems stabilized or resolved: Yes. Denies suicidal/homicidal ideation: Yes. Issues/concerns per patient self-inventory: No. Other: none  New problem(s) identified: No, Describe:  none  New Short Term/Long Term Goal(s):  Patient Goals:  "get suicide off my mind"  Discharge Plan or Barriers:   Reason for Continuation of Hospitalization: Depression Medication stabilization  Estimated Length of Stay: 1-2 days  Attendees: Patient: 11/27/2018   Physician: Dr. Jake Samples, MD 11/27/2018   Nursing: Baldo Daub, RN 11/27/2018   RN Care Manager: 11/27/2018   Social Worker: Lurline Idol, LCSW 11/27/2018   Recreational Therapist:  11/27/2018   Other:  11/27/2018   Other:  11/27/2018   Other: 11/27/2018     Scribe for Treatment Team: Joanne Chars, [place] 11/27/2018 9:47 AM

## 2018-11-27 NOTE — Progress Notes (Signed)
Patient ID: Kurt Foley, male   DOB: 28-Oct-1975, 43 y.o.   MRN: [id number] [name] NOVEL CORONAVIRUS (COVID-19) DAILY CHECK-OFF SYMPTOMS - answer yes or no to each - every day NO YES  Have you had a fever in the past 24 hours?  . Fever (Temp > 37.80C / 100F) X   Have you had any of these symptoms in the past 24 hours? . New Cough .  Sore Throat  .  Shortness of Breath .  Difficulty Breathing .  Unexplained Body Aches   X   Have you had any one of these symptoms in the past 24 hours not related to allergies?   . Runny Nose .  Nasal Congestion .  Sneezing   X   If you have had runny nose, nasal congestion, sneezing in the past 24 hours, has it worsened?  X   EXPOSURES - check yes or no X   Have you traveled outside the state in the past 14 days?  X   Have you been in contact with someone with a confirmed diagnosis of COVID-19 or PUI in the past 14 days without wearing appropriate PPE?  X   Have you been living in the same home as a person with confirmed diagnosis of COVID-19 or a PUI (household contact)?    X   Have you been diagnosed with COVID-19?    X              What to do next: Answered NO to all: Answered YES to anything:   Proceed with unit schedule Follow the [facility] Inpatient Flowsheet.

## 2018-11-27 NOTE — Progress Notes (Signed)
Recreation Therapy Notes  Date:  6.17.20 Time: 0930 Location: [address] Day[address]  Group Topic: Stress Management  Goal Area(s) Addresses:  Patient will identify positive stress management techniques. Patient will identify benefits of using stress management post d/c.  Intervention:  Stress Management  Activity :  Meditation.  LRT introduced the stress management technique of meditation.  LRT played a meditation on letting go of the past and focusing on the present.  Patients were to follow along as the meditation was played to engage in activity.    Education:  Stress Management, Discharge Planning.   Education Outcome: Acknowledges Education  Clinical Observations/Feedback:  Pt did not attend group.     Victorino Sparrow, LRT/CTRS        Victorino Sparrow A 11/27/2018 11:24 AM

## 2018-11-27 NOTE — [facility] Suicide Risk Assessment (Signed)
[place] INPATIENT:  Family/Significant Other Suicide Prevention Education  Suicide Prevention Education:  Contact Attempts: Peterson [place], [place], [phone], has been identified by the patient as the family member/significant other with whom the patient will be residing, and identified as the person(s) who will aid the patient in the event of a mental health crisis.  With written consent from the patient, two attempts were made to provide suicide prevention education, prior to and/or following the patient's discharge.  We were unsuccessful in providing suicide prevention education.  A suicide education pamphlet was given to the patient to share with family/significant other.  Date and time of first attempt:11/27/18, 1349 Date and time of second attempt:  Joanne Chars, LCSW 11/27/2018, 1:49 PM

## 2018-11-27 NOTE — Progress Notes (Signed)
Patient ID: Kurt Foley, male   DOB: December 15, 1975, 43 y.o.   MRN: [id number] Pt has been isolative to his room. seclusive to self. Pt mood and affect depressed, anxious. Pt negative for groups despite prompting. Pt expressed concern about his T cell count. Pt contracts for saefty.

## 2018-11-28 MED ORDER — QUETIAPINE FUMARATE 200 MG PO TABS: 200.0000 mg | ORAL_TABLET | Freq: Every day | ORAL | 0 refills | Status: DC

## 2018-11-28 MED ORDER — HYDROCORTISONE 1 % EX CREA
TOPICAL_CREAM | Freq: Two times a day (BID) | CUTANEOUS | Status: DC
  Administered 2018-11-28: 11:00:00 via TOPICAL
  Filled 2018-11-28: qty 28

## 2018-11-28 MED ORDER — MIRTAZAPINE 7.5 MG PO TABS: 7.5000 mg | ORAL_TABLET | Freq: Every day | ORAL | 0 refills | Status: DC

## 2018-11-28 NOTE — Progress Notes (Signed)
Patient ID: Kurt Foley, male   DOB: [birth year], 43 y.o.   MRN: [id number] Patient discharged to home/self care on his own accord.  Patient denies SI, HI and AVH upon discharge.   Patient acknowledged receipt of all personal belonging and understanding of all discharge instructions.
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
## 2018-11-28 NOTE — Progress Notes (Signed)
  [facility] Adult Case Management Discharge Plan :  Will you be returning to the same living situation after discharge:  No. Provided shelter resources.  At discharge, do you have transportation home?: Yes,  busses are free. Do you have the ability to pay for your medications: No. Referred to community health  Release of information consent forms completed and in the chart. Patient to Follow up at: Follow-up Information    [facility] Follow up on 12/06/2018.   Why: Telephonic hospital follow up appointment is Friday, 6/26 at 2:00p.  The provider will contact you the day of the appointment.  Contact information: [place] [phone] [place] for Infectious Disease Follow up on 12/02/2018.   Why: Medication maintenance with the Pharmacy for HIV is Monday, 6/22 at 2:00p.  Please bring your photo ID and current medications.  Contact information: [place] [id number] [place] [id number] Ph: [phone] Fx: [phone]          Next level of care provider has access to [place] and Suicide Prevention discussed: Yes,  attempted to reach godmother twice, SPE pamphlet placed on chart.  Have you used any form of tobacco in the last 30 days? (Cigarettes, Smokeless Tobacco, Cigars, and/or Pipes): Yes  Has patient been referred to the Quitline?: Patient refused referral  Patient has been referred for addiction treatment: Yes  Joellen Jersey, [place] 11/28/2018, 10:56 AM

## 2018-11-28 NOTE — BHH Suicide Risk Assessment (Signed)
[facility] Discharge Suicide Risk Assessment   Principal Problem: Homelessness/HIV positive/substance abuse Discharge Diagnoses: Active Problems:   Major depressive disorder, recurrent severe without psychotic features ([place])   Total Time spent with patient: 45 minutes  Musculoskeletal: Strength & Muscle Tone: within normal limits Gait & Station: normal   Psychiatric Specialty Exam: ROS  Blood pressure (!) 136/104, pulse (!) 108, temperature 98.1 F (36.7 C), temperature source Oral, resp. rate 20, height 5\' 6"  (1.676 m), weight 49.9 kg, SpO2 100 %.Body mass index is 17.75 kg/m.  General Appearance: Casual  Eye Contact::  Fair  Speech:  Slow409  Volume:  Decreased  Mood:  Dysphoric  Affect:  Constricted  Thought Process:  Coherent and Descriptions of Associations: Tangential  Orientation:  Full (Time, Place, and Person)  Thought Content:  Logical and Tangential  Suicidal Thoughts:  No  Homicidal Thoughts:  No  Memory:  Immediate;   Good  Judgement:  Fair  Insight:  Fair  Psychomotor Activity:  Normal  Concentration:  Fair  Recall:  [employer] of Knowledge:Fair  Language: Fair  Akathisia:  Negative  Handed:  Right  AIMS (if indicated):     Assets:  Communication Skills Desire for Improvement  Sleep:  Number of Hours: 5.25  Cognition: WNL  ADL's:  Intact   Mental Status Per Nursing Assessment::   On Admission:  Self-harm thoughts  Demographic Factors:  Male  Loss Factors: Decrease in vocational status and Decline in physical health  Historical Factors: NA  Risk Reduction Factors:   Religious beliefs about death  Continued Clinical Symptoms:  Dysthymia  Cognitive Features That Contribute To Risk:  Thought constriction (tunnel vision)    Suicide Risk:  Minimal: No identifiable suicidal ideation.  Patients presenting with no risk factors but with morbid ruminations; may be classified as minimal risk based on the severity of the depressive symptoms  Follow-up  Information    [facility] Follow up on 12/06/2018.   Why: Telephonic hospital follow up appointment is Friday, 6/26 at 2:00p.  The provider will contact you the day of the appointment.  Contact information: [place] [id number] [place] for Infectious Disease Follow up on 12/02/2018.   Why: Medication maintenance with the Pharmacy for HIV is Monday, 6/22 at 2:00p.  Please bring your photo ID and current medications.  Contact information: [id number] [place] [id number] Ph: [phone] Fx: [phone]          Plan Of Care/Follow-up recommendations:  Activity:  full  Kurt Ertle, MD 11/28/2018, 11:06 AM

## 2018-11-28 NOTE — BHH Suicide Risk Assessment (Signed)
[place] INPATIENT:  Family/Significant Other Suicide Prevention Education  Suicide Prevention Education:  Contact Attempts: Peterson Lombard, [place], [phone], has been identified by the patient as the family member/significant other with whom the patient will be residing, and identified as the person(s) who will aid the patient in the event of a mental health crisis.  With written consent from the patient, two attempts were made to provide suicide prevention education, prior to and/or following the patient's discharge.  We were unsuccessful in providing suicide prevention education.  A suicide education pamphlet was given to the patient to share with family/significant other.  Date and time of first attempt:11/27/18, 1349 Date and time of second attempt: 11/27/2018 at 9:30am  Joellen Jersey, LCSW 11/28/2018, 9:35 AM

## 2018-12-02 ENCOUNTER — Other Ambulatory Visit: Payer: Self-pay

## 2018-12-02 ENCOUNTER — Ambulatory Visit: Payer: Self-pay | Admitting: Pharmacist

## 2018-12-02 ENCOUNTER — Ambulatory Visit: Payer: Self-pay

## 2018-12-04 ENCOUNTER — Inpatient Hospital Stay: Payer: Self-pay | Admitting: Internal Medicine

## 2018-12-23 ENCOUNTER — Telehealth: Payer: Self-pay | Admitting: Pharmacy Technician

## 2018-12-23 ENCOUNTER — Encounter: Payer: Self-pay | Admitting: Infectious Diseases

## 2018-12-23 ENCOUNTER — Ambulatory Visit: Payer: Self-pay | Admitting: Pharmacist

## 2018-12-23 NOTE — Telephone Encounter (Signed)
[facility] Patient Advocate Encounter ° °Insurance verification completed.   ° °The patient is uninsured and will need patient assistance for medication. ° °We can complete the application and will need to meet with the patient for signatures and income documentation. ° °Joselynn Amoroso E. Khambrel Amsden, CPhT °Specialty Pharmacy Patient Advocate °[facility] °Phone: [phone] °Fax:  [phone] ° ° °

## 2018-12-24 ENCOUNTER — Telehealth: Payer: Self-pay | Admitting: Pharmacy Technician

## 2018-12-24 NOTE — Telephone Encounter (Signed)
error 

## 2019-05-29 ENCOUNTER — Other Ambulatory Visit: Payer: Self-pay

## 2019-05-29 ENCOUNTER — Emergency Department (HOSPITAL_COMMUNITY): Payer: Self-pay

## 2019-05-29 ENCOUNTER — Encounter (HOSPITAL_COMMUNITY): Payer: Self-pay

## 2019-05-29 ENCOUNTER — Emergency Department (HOSPITAL_COMMUNITY)
Admission: EM | Admit: 2019-05-29 | Discharge: 2019-05-29 | Disposition: A | Discharge status: Home / Self Care | Payer: Self-pay | Attending: Emergency Medicine | Admitting: Emergency Medicine

## 2019-05-29 DIAGNOSIS — R1013 Epigastric pain: Secondary | ICD-10-CM | POA: Insufficient documentation

## 2019-05-29 DIAGNOSIS — R1012 Left upper quadrant pain: Secondary | ICD-10-CM | POA: Insufficient documentation

## 2019-05-29 DIAGNOSIS — F1410 Cocaine abuse, uncomplicated: Secondary | ICD-10-CM | POA: Insufficient documentation

## 2019-05-29 DIAGNOSIS — Z79899 Other long term (current) drug therapy: Secondary | ICD-10-CM | POA: Insufficient documentation

## 2019-05-29 DIAGNOSIS — R1011 Right upper quadrant pain: Secondary | ICD-10-CM | POA: Insufficient documentation

## 2019-05-29 DIAGNOSIS — B353 Tinea pedis: Secondary | ICD-10-CM | POA: Insufficient documentation

## 2019-05-29 DIAGNOSIS — M79671 Pain in right foot: Secondary | ICD-10-CM | POA: Insufficient documentation

## 2019-05-29 DIAGNOSIS — F17210 Nicotine dependence, cigarettes, uncomplicated: Secondary | ICD-10-CM | POA: Insufficient documentation

## 2019-05-29 DIAGNOSIS — F1210 Cannabis abuse, uncomplicated: Secondary | ICD-10-CM | POA: Insufficient documentation

## 2019-05-29 DIAGNOSIS — M79672 Pain in left foot: Secondary | ICD-10-CM | POA: Insufficient documentation

## 2019-05-29 DIAGNOSIS — B20 Human immunodeficiency virus [HIV] disease: Secondary | ICD-10-CM | POA: Insufficient documentation

## 2019-05-29 LAB — COMPREHENSIVE METABOLIC PANEL
ALT: 20 U/L (ref 0–44)
AST: 40 U/L (ref 15–41)
Albumin: 3.7 g/dL (ref 3.5–5.0)
Alkaline Phosphatase: 116 U/L (ref 38–126)
Anion gap: 8 (ref 5–15)
BUN: 7 mg/dL (ref 6–20)
CO2: 28 mmol/L (ref 22–32)
Calcium: 9.1 mg/dL (ref 8.9–10.3)
Chloride: 103 mmol/L (ref 98–111)
Creatinine, Ser: 0.85 mg/dL (ref 0.61–1.24)
GFR calc Af Amer: >60 mL/min (ref >60)
GFR calc non Af Amer: >60 mL/min (ref >60)
Glucose, Bld: 79 mg/dL (ref 70–99)
Potassium: 4.3 mmol/L (ref 3.5–5.1)
Sodium: 139 mmol/L (ref 135–145)
Total Bilirubin: 0.4 mg/dL (ref 0.3–1.2)
Total Protein: 8.3 g/dL — ABNORMAL HIGH (ref 6.5–8.1)

## 2019-05-29 LAB — URINALYSIS, ROUTINE W REFLEX MICROSCOPIC
Bacteria, UA: NONE SEEN
Bilirubin Urine: NEGATIVE
Glucose, UA: NEGATIVE mg/dL
Hgb urine dipstick: NEGATIVE
Ketones, ur: NEGATIVE mg/dL
Leukocytes,Ua: NEGATIVE
Nitrite: NEGATIVE
Protein, ur: 30 mg/dL — AB
Specific Gravity, Urine: 1.019 (ref 1.005–1.030)
pH: 7 (ref 5.0–8.0)

## 2019-05-29 LAB — CBC
HCT: 41.5 % (ref 39.0–52.0)
Hemoglobin: 13.6 g/dL (ref 13.0–17.0)
MCH: 31.1 pg (ref 26.0–34.0)
MCHC: 32.8 g/dL (ref 30.0–36.0)
MCV: 95 fL (ref 80.0–100.0)
Platelets: 246 10*3/uL (ref 150–400)
RBC: 4.37 MIL/uL (ref 4.22–5.81)
RDW: 12.9 % (ref 11.5–15.5)
WBC: 2.2 10*3/uL — ABNORMAL LOW (ref 4.0–10.5)
nRBC: 0 % (ref 0.0–0.2)

## 2019-05-29 LAB — LIPASE, BLOOD: Lipase: 34 U/L (ref 11–51)

## 2019-05-29 MED ORDER — FLUCONAZOLE 200 MG PO TABS: 200.0000 mg | ORAL_TABLET | ORAL | 0 refills | Status: DC

## 2019-05-29 MED ORDER — OXYCODONE-ACETAMINOPHEN 5-325 MG PO TABS
1.0000 | ORAL_TABLET | Freq: Once | ORAL | Status: AC
  Administered 2019-05-29: 17:00:00 1 via ORAL
  Filled 2019-05-29: qty 1

## 2019-05-29 MED ORDER — FAMOTIDINE 40 MG PO TABS: 40.0000 mg | ORAL_TABLET | Freq: Every day | ORAL | 0 refills | Status: DC

## 2019-05-29 MED ORDER — CLOTRIMAZOLE 1 % EX CREA: TOPICAL_CREAM | CUTANEOUS | 0 refills | Status: DC

## 2019-05-29 MED ORDER — ALUM & MAG HYDROXIDE-SIMETH 200-200-20 MG/5ML PO SUSP
30.0000 mL | Freq: Once | ORAL | Status: AC
  Administered 2019-05-29: 17:00:00 30 mL via ORAL
  Filled 2019-05-29: qty 30

## 2019-05-29 MED ORDER — ONDANSETRON 4 MG PO TBDP
4.0000 mg | ORAL_TABLET | Freq: Once | ORAL | Status: AC
  Administered 2019-05-29: 17:00:00 4 mg via ORAL
  Filled 2019-05-29: qty 1

## 2019-05-29 MED ORDER — KETOCONAZOLE 200 MG PO TABS: 200.0000 mg | ORAL_TABLET | Freq: Once | ORAL | Status: DC

## 2019-05-29 MED ORDER — BACITRACIN ZINC 500 UNIT/GM EX OINT
TOPICAL_OINTMENT | CUTANEOUS | Status: AC
  Administered 2019-05-29: 5
  Filled 2019-05-29: qty 4.5

## 2019-05-29 MED ORDER — LIDOCAINE VISCOUS HCL 2 % MT SOLN
15.0000 mL | Freq: Once | OROMUCOSAL | Status: AC
  Administered 2019-05-29: 15 mL via ORAL
  Filled 2019-05-29: qty 15

## 2019-05-29 MED ORDER — FAMOTIDINE 20 MG PO TABS
40.0000 mg | ORAL_TABLET | Freq: Once | ORAL | Status: AC
  Administered 2019-05-29: 40 mg via ORAL
  Filled 2019-05-29: qty 2

## 2019-05-29 MED ORDER — SODIUM CHLORIDE 0.9 % IV BOLUS
1000.0000 mL | Freq: Once | INTRAVENOUS | Status: AC
  Administered 2019-05-29: 18:00:00 1000 mL via INTRAVENOUS

## 2019-05-29 MED ORDER — FLUCONAZOLE 150 MG PO TABS
150.0000 mg | ORAL_TABLET | Freq: Once | ORAL | Status: AC
  Administered 2019-05-29: 18:00:00 150 mg via ORAL
  Filled 2019-05-29: qty 1

## 2019-05-29 NOTE — Progress Notes (Signed)
  [place] Medication Assistance Card  Name: Kurt Foley  ID (MRN): [id number] [place]: [id number] RX Group: [id number]  Discharge Date:05/29/2019  7:53 PM  Expiration Date: 06/06/2019  (must be filled within 7 days of discharge)     Dear Kurt Foley:  You have been approved to have the prescriptions written by your discharging physician filled through our [facility] (Medication Assistance Through [facility]) program. This program allows for a one-time (no refills) 34-day supply of selected medications for a low copay amount.  The copay is $3.00 per prescription. For instance, if you have one prescription, you will pay $3.00; for two prescriptions, you pay $6.00; for three prescriptions, you pay $9.00; and so on.  Only certain pharmacies are participating in this program with [facility]. You will need to select one of the pharmacies from the attached list and take your prescriptions, this letter, and your photo ID to one of the participating pharmacies.   We are excited that you are able to use the [facility] program to get your medications. These prescriptions must be filled within 7 days of hospital discharge or they will no longer be valid for the [facility] program. Should you have any problems with your prescriptions please contact your case management team member at [phone] for [place] you, Kurt Foley [id number] [place]

## 2019-05-29 NOTE — Discharge Instructions (Addendum)
I have prescribed you Pepcid for your abdominal pain is most likely peptic ulcer disease or gastritis.  Please follow-up with your infectious disease expert as controlling her HIV is very important.  I have also prescribed you fluconazole.  You must take this once per week for the next 5 weeks.  He was also apply Lotrimin which I prescribed to you twice a day for the next week.  Please apply this to the area between her toes where there is pain and scaling.

## 2019-05-29 NOTE — ED Provider Notes (Signed)
[facility]-EMERGENCY DEPT Provider Note   CSN: [id number] Arrival date & time: 05/29/19  1255     History Chief Complaint  Patient presents with   Abdominal Pain   Foot Pain    Kurt Foley is a 43 y.o. male history of HIV not currently on his medications as they can feel weak  HPI  Patient is in today with bilateral foot pain and tenderness to touch with concern for infection states that he has foul smelling feet.  Patient endorses subjective fevers and chills at home.  Patient states he also has left upper quadrant and epigastric pain with eating.  States he takes ibuprofen 800 mg once a day for least a week patient is unsure if you can take this for longer.  States he also drinks at least 1 beer a day.  Overall poor historian.  Patient states that abdominal pain began 3 days ago and is intermittent, waxing and waning, severe, worse with eating denies any positional changes.  Denies any vomiting but endorses nausea.  States that he has had no black/tarry stools denies any hematochezia.  States he has had diarrhea.     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 No  pertinent surgical history.     Family History  Problem Relation Age of Onset   Diabetes Mother    Hypertension Mother    Arthritis Mother     Social History   Tobacco Use   Smoking status: Current Every Day Smoker    Packs/day: 0.50    Years: 25.00    Pack years: 12.50    Types: Cigarettes   Smokeless tobacco: Never Used   Tobacco comment: cutting back  Substance Use Topics   Alcohol use: Yes    Alcohol/week: 2.0 standard drinks    Types: 2 Standard drinks or equivalent per week    Comment: 3-4 days a week.Last drink: today   Drug use: Yes    Types: Marijuana, Cocaine    Comment: Once a week. Last used yesterday.  Cocaine last used today.     Home Medications Prior to Admission medications   Medication Sig Start Date End Date Taking? Authorizing Provider  mirtazapine (REMERON) 7.5 MG tablet Take 1 tablet (7.5 mg total) by mouth at bedtime. 11/28/18  Yes Malvin Johns, MD  QUEtiapine (SEROQUEL) 200 MG tablet Take 1 tablet (200 mg total) by mouth at bedtime. 11/28/18  Yes Malvin Johns, MD  abacavir-dolutegravir-lamiVUDine (TRIUMEQ) 600-50-300 MG tablet Take 1 tablet by mouth daily. Patient not taking: Reported on 05/29/2019 12/26/16   Kuppelweiser, Cassie L, RPH-CPP  clotrimazole (LOTRIMIN) 1 % cream Apply to affected area 2 times daily 05/29/19   Solon Augusta S, PA  darunavir-cobicistat (PREZCOBIX) 800-150 MG tablet Take  1 tablet by mouth daily with breakfast. Patient not taking: Reported on 05/29/2019 12/26/16   Kuppelweiser, Cassie L, RPH-CPP  famotidine (PEPCID) 40 MG tablet Take 1 tablet (40 mg total) by mouth daily for 14 days. 05/29/19 06/12/19  Tedd Sias, PA  fluconazole (DIFLUCAN) 200 MG tablet Take 1 tablet (200 mg total) by mouth once a week for 5 doses. 05/29/19 06/27/19  Tedd Sias, PA  lactobacillus acidophilus & bulgar (LACTINEX) chewable tablet Chew 1 tablet by mouth 3 (three) times daily with meals. Patient not taking: Reported on 11/24/2018  07/11/16   Antonietta Breach, PA-C  loperamide (IMODIUM) 2 MG capsule Take 1 capsule (2 mg total) by mouth every 8 (eight) hours as needed for diarrhea or loose stools. Patient not taking: Reported on 06/13/2017 07/11/16   Antonietta Breach, PA-C    Allergies    Patient has no known allergies.  Review of Systems   Review of Systems  Constitutional: Positive for chills and fever.  Respiratory: Negative for cough, chest tightness and shortness of breath.   Gastrointestinal: Positive for abdominal pain, diarrhea and nausea. Negative for vomiting.  Genitourinary: Negative for dysuria and frequency.  Musculoskeletal: Negative for myalgias.  Skin: Negative for rash.  Psychiatric/Behavioral: The patient is not nervous/anxious.     Physical Exam Updated Vital Signs BP (!) 116/97    Pulse 88    Temp 98.4 F (36.9 C) (Oral)    Resp 17    SpO2 99%   Physical Exam Vitals and nursing note reviewed.  Constitutional:      Appearance: He is not ill-appearing.     Comments: Patient asleep in bed on entering room.  Appears in no acute distress or pain.  HENT:     Head: Normocephalic and atraumatic.     Mouth/Throat:     Mouth: Mucous membranes are moist.  Eyes:     General: No scleral icterus. Cardiovascular:     Rate and Rhythm: Normal rate and regular rhythm.     Pulses: Normal pulses.     Heart sounds: Normal heart sounds.  Pulmonary:     Effort: Pulmonary effort is normal.     Breath sounds: Normal breath sounds.  Abdominal:     General: Abdomen is flat.     Palpations: Abdomen is soft.     Tenderness: There is abdominal tenderness. There is no right CVA tenderness or left CVA tenderness.     Comments: Tenderness to palpation right upper quadrant, left upper quadrant, epigastrium.  Voluntary guarding intact upper abdomen.  Positive Murphy sign.  No right lower quadrant tenderness, negative Rovsing, negative Murphy's, no suprapubic tenderness.  Musculoskeletal:     Cervical back: No rigidity.      Right lower leg: No edema.     Left lower leg: No edema.  Skin:    General: Skin is warm and dry.     Capillary Refill: Capillary refill takes less than 2 seconds.     Comments: Severe interdigital tinea pedis with irritated, erythematous, painful fissures most severely between the fourth and fifth toes bilaterally.  Tenderness to palpation of the areas between all toes.  Neurological:     Mental Status: He is alert. Mental status is at baseline.  Psychiatric:        Behavior: Behavior normal.     Comments: Giggling intermittently, oriented x3, quirky demeanor     ED Results / Procedures / Treatments   Labs (all labs ordered are listed, but only abnormal results are  displayed) Labs Reviewed  COMPREHENSIVE METABOLIC PANEL - Abnormal; Notable for the following components:      Result Value   Total Protein 8.3 (*)    All other components within normal limits  CBC - Abnormal; Notable for the following components:   WBC 2.2 (*)    All other components within normal limits  URINALYSIS, ROUTINE W REFLEX MICROSCOPIC - Abnormal; Notable for the following components:   Protein, ur 30 (*)    All other components within normal limits  LIPASE, BLOOD    EKG EKG Interpretation  Date/Time:  Thursday May 29 2019 13:18:01 EST Ventricular Rate:  111 PR Interval:    QRS Duration: 88 QT Interval:  327 QTC Calculation: 445 R Axis:   82 Text Interpretation: Sinus tachycardia Biatrial enlargement rate is faster, otherwise ECG is similar to Jan 2019 Confirmed by Pricilla Loveless [phone]) on 05/29/2019 4:07:18 PM   Radiology US Abdomen Limited  Result Date: 05/29/2019 CLINICAL DATA:  Initial evaluation for acute epigastric pain. EXAM: ULTRASOUND ABDOMEN LIMITED RIGHT UPPER QUADRANT COMPARISON:  None. FINDINGS: Gallbladder: No gallstones or wall thickening visualized. No sonographic Murphy sign noted by sonographer. Common bile duct: Diameter: 3.7 mm Liver: Well-circumscribed round  echogenic lesion measuring 1.5 x 1.3 x 1.6 cm noted within the right hepatic lobe, most consistent with a small benign hemangioma. Within normal limits in parenchymal echogenicity. Portal vein is patent on color Doppler imaging with normal direction of blood flow towards the liver. Other: None. IMPRESSION: 1. Normal sonographic evaluation of the gallbladder. No biliary dilatation. 2. 1.6 cm echogenic lesion within the right hepatic lobe, most characteristic of a small benign hemangioma. Electronically Signed   By: Rise Mu M.D.   On: 05/29/2019 19:23   DG Chest Port 1 View  Result Date: 05/29/2019 CLINICAL DATA:  Weakness, vomiting EXAM: PORTABLE CHEST 1 VIEW COMPARISON:  06/13/2017 FINDINGS: Heart and mediastinal contours are within normal limits. No focal opacities or effusions. No acute bony abnormality. Old healed left rib fractures. IMPRESSION: No active cardiopulmonary disease. Electronically Signed   By: Charlett Nose M.D.   On: 05/29/2019 18:21    Procedures Procedures (including critical care time)  Medications Ordered in ED Medications  oxyCODONE-acetaminophen (PERCOCET/ROXICET) 5-325 MG per tablet 1 tablet (1 tablet Oral Given 05/29/19 1718)  alum & mag hydroxide-simeth (MAALOX/MYLANTA) 200-200-20 MG/5ML suspension 30 mL (30 mLs Oral Given 05/29/19 1719)    And  lidocaine (XYLOCAINE) 2 % viscous mouth solution 15 mL (15 mLs Oral Given 05/29/19 1719)  ondansetron (ZOFRAN-ODT) disintegrating tablet 4 mg (4 mg Oral Given 05/29/19 1719)  famotidine (PEPCID) tablet 40 mg (40 mg Oral Given 05/29/19 1719)  sodium chloride 0.9 % bolus 1,000 mL (0 mLs Intravenous Stopped 05/29/19 1909)  fluconazole (DIFLUCAN) tablet 150 mg (150 mg Oral Given 05/29/19 1739)    ED Course  I have reviewed the triage vital signs and the nursing notes.  Pertinent labs & imaging results that were available during my care of the patient were reviewed by me and considered in my medical decision making  (see chart for details).  Clinical Course as of May 28 1937  Thu May 29, 2019  1710 EKG is nonischemic.  Rate of 110.  ED EKG [WF]  1921 No evidence of pneumonia, or pulmonary infection  DG Chest Port 1 View [WF]  1922 Tachycardic.  Nonischemic  ED EKG [WF]  1922 Leukopenia.  Slightly decreased from 6 months ago.  Last HIV medications likely reason for patient's decreased ability.  No other CBC abnormalities  CBC(!) [WF]  1922 Unremarkable CMP  Comprehensive metabolic panel(!) [WF]  1922 UA within normal limits  Urinalysis, Routine w reflex microscopic(!) [WF]    Clinical Course User Index [WF] Gailen ShelterFondaw, Lochlin Eppinger S, [place]   MDM Rules/Calculators/A&P                       Patient is HIV +43 year old male presented today with chief complaint of bilateral foot pain and abdominal pain.  Patient clearly has severe fungal infection of bilateral feet.  Patient is never been treated for tinea pedis before.  Abdominal pain is left upper quadrant is tender to palpation left upper, right upper and epigastric abdomen.  Patient blood work is reassuring with no electrolyte abnormalities and liver function appears normal.  Patient does have leukopenia which appears to have decreased slightly from 6 months ago.  This is consistent with patient no longer taking his antivirals.  Patient has guarding and rebound and severe tenderness to palpation over right upper quadrant will order ultrasound of her upper quadrant to exclude cholecystitis.  Otherwise most likely symptoms peptic ulcer disease as patient has chronic alcohol use, NSAID use and history and physical exam consistent with this.  We will treat symptomatically for PUD recommend follow-up with GI.  Ultimately patient will need to follow-up with his infectious disease doctor.  Cholecystitis is excluded by normal upper quadrant ultrasound slightly tenderness of the PUD or gastritis.  Patient has mild tachycardia here.  Is afebrile.  Vitals are grossly  WNL.  Patient is otherwise well-appearing   Reassessment : Patient states his abdominal pain is resolved.  Suspect that this is due to gastritis or peptic ulcer disease.  Will prescribe patient Pepcid.    We will treat patient's tinea pedis with fluconazole once weekly for 6 weeks.  Patient given first dose here.  We will also prescribe terbinafine 1% cream which we will apply twice daily for 1 week.  Discussed case with case management who confirms that patient is eligible for and will receive medication assistance.   Final Clinical Impression(s) / ED Diagnoses Final diagnoses:  Tinea pedis of both feet  Pain in both feet  RUQ pain  LUQ pain  Epigastric pain    Rx / DC Orders ED Discharge Orders         Ordered    famotidine (PEPCID) 40 MG tablet  Daily     05/29/19 1938    clotrimazole (LOTRIMIN) 1 % cream     05/29/19 1938    fluconazole (DIFLUCAN) 200 MG tablet  Weekly     05/29/19 1938           Gailen ShelterFondaw, Javione Gunawan S, [place] 05/29/19 2149    Charlynne PanderYao, David Hsienta, MD 05/29/19 [phone]

## 2019-05-29 NOTE — ED Triage Notes (Signed)
Pt reports epigastric pain upon eating. Pt also reports bilateral foot pain and swelling and states that he thinks it is gout.

## 2019-05-29 NOTE — Progress Notes (Addendum)
TOC CM spoke to pt and states he is unable to afford his meds. He currently without insurance. Pt follows up at Infectious Disease and sees [name]. Explained [employer] letter with $0 copay and once per year use. Pt state he is from house to house with family. Faxed [facility] to his pharmacy. Jonnie Finner RN CCM, [facility] ED TOC CM [phone]  05/30/2019 3:02 pm Contacted pt and he did receive meds. [place], [place] ED TOC CM [phone]

## 2019-06-01 ENCOUNTER — Emergency Department (HOSPITAL_COMMUNITY): Payer: Self-pay

## 2019-06-01 ENCOUNTER — Other Ambulatory Visit: Payer: Self-pay

## 2019-06-01 ENCOUNTER — Inpatient Hospital Stay (HOSPITAL_COMMUNITY)
Admission: EM | Admit: 2019-06-01 | Discharge: 2019-06-04 | DRG: 682 | Disposition: A | Discharge status: Home / Self Care | Payer: Self-pay | Attending: Internal Medicine | Admitting: Internal Medicine

## 2019-06-01 DIAGNOSIS — Z20828 Contact with and (suspected) exposure to other viral communicable diseases: Secondary | ICD-10-CM | POA: Diagnosis present

## 2019-06-01 DIAGNOSIS — Z9114 Patient's other noncompliance with medication regimen: Secondary | ICD-10-CM

## 2019-06-01 DIAGNOSIS — R112 Nausea with vomiting, unspecified: Secondary | ICD-10-CM | POA: Diagnosis present

## 2019-06-01 DIAGNOSIS — Z9119 Patient's noncompliance with other medical treatment and regimen: Secondary | ICD-10-CM

## 2019-06-01 DIAGNOSIS — R109 Unspecified abdominal pain: Secondary | ICD-10-CM

## 2019-06-01 DIAGNOSIS — R64 Cachexia: Secondary | ICD-10-CM | POA: Diagnosis present

## 2019-06-01 DIAGNOSIS — N179 Acute kidney failure, unspecified: Principal | ICD-10-CM | POA: Diagnosis present

## 2019-06-01 DIAGNOSIS — Z681 Body mass index (BMI) 19 or less, adult: Secondary | ICD-10-CM

## 2019-06-01 DIAGNOSIS — E860 Dehydration: Secondary | ICD-10-CM | POA: Diagnosis present

## 2019-06-01 DIAGNOSIS — B20 Human immunodeficiency virus [HIV] disease: Secondary | ICD-10-CM | POA: Diagnosis present

## 2019-06-01 DIAGNOSIS — R197 Diarrhea, unspecified: Secondary | ICD-10-CM | POA: Diagnosis present

## 2019-06-01 DIAGNOSIS — D72818 Other decreased white blood cell count: Secondary | ICD-10-CM | POA: Diagnosis present

## 2019-06-01 DIAGNOSIS — J189 Pneumonia, unspecified organism: Secondary | ICD-10-CM | POA: Diagnosis present

## 2019-06-01 DIAGNOSIS — F339 Major depressive disorder, recurrent, unspecified: Secondary | ICD-10-CM | POA: Diagnosis present

## 2019-06-01 DIAGNOSIS — Z79899 Other long term (current) drug therapy: Secondary | ICD-10-CM

## 2019-06-01 DIAGNOSIS — F17210 Nicotine dependence, cigarettes, uncomplicated: Secondary | ICD-10-CM | POA: Diagnosis present

## 2019-06-01 DIAGNOSIS — R531 Weakness: Secondary | ICD-10-CM

## 2019-06-01 DIAGNOSIS — I959 Hypotension, unspecified: Secondary | ICD-10-CM | POA: Diagnosis present

## 2019-06-01 DIAGNOSIS — K8689 Other specified diseases of pancreas: Secondary | ICD-10-CM | POA: Diagnosis present

## 2019-06-01 DIAGNOSIS — Z8249 Family history of ischemic heart disease and other diseases of the circulatory system: Secondary | ICD-10-CM

## 2019-06-01 MED ORDER — SODIUM CHLORIDE 0.9 % IV BOLUS (SEPSIS)
1000.0000 mL | Freq: Once | INTRAVENOUS | Status: AC
  Administered 2019-06-02: 1000 mL via INTRAVENOUS

## 2019-06-01 MED ORDER — ONDANSETRON HCL 4 MG/2ML IJ SOLN
4.0000 mg | Freq: Once | INTRAMUSCULAR | Status: AC
  Administered 2019-06-02: 4 mg via INTRAVENOUS
  Filled 2019-06-01: qty 2

## 2019-06-01 MED ORDER — KETOROLAC TROMETHAMINE 30 MG/ML IJ SOLN
30.0000 mg | Freq: Once | INTRAMUSCULAR | Status: AC
  Administered 2019-06-02: 30 mg via INTRAVENOUS
  Filled 2019-06-01: qty 1

## 2019-06-01 NOTE — ED Provider Notes (Signed)
TIME SEEN: 11:41 PM  CHIEF COMPLAINT: Generalized weakness, fatigue, dizziness, nausea, vomiting, diarrhea, loss of taste and smell, decreased appetite  HPI: Patient is a 43 year old male with history of HIV with last CD4 count of less than 35 in June 2020 who presents to the emergency department with 1 week of generalized weakness, fatigue, lightheadedness, nausea, vomiting, diarrhea, mild headache behind his eyes, loss of taste and smell and decreased appetite.  States he thinks he is dehydrated.  No known fevers but has had chills.  No known Covid exposures.  No cough, chest pain or shortness of breath.  ROS: See HPI Constitutional: no fever  Eyes: no drainage  ENT: no runny nose   Cardiovascular:  no chest pain  Resp: no SOB  GI: Diarrhea and vomiting GU: no dysuria Integumentary: no rash  Allergy: no hives  Musculoskeletal: no leg swelling  Neurological: no slurred speech ROS otherwise negative  PAST MEDICAL HISTORY/PAST SURGICAL HISTORY:  Past Medical History:  Diagnosis Date  . Depression   . HIV infection ([place])     MEDICATIONS:  Prior to Admission medications   Medication Sig Start Date End Date Taking? Authorizing Provider  abacavir-dolutegravir-lamiVUDine (TRIUMEQ) 600-50-300 MG tablet Take 1 tablet by mouth daily. Patient not taking: Reported on 05/29/2019 12/26/16   Kuppelweiser, Cassie L, RPH-CPP  clotrimazole (LOTRIMIN) 1 % cream Apply to affected area 2 times daily 05/29/19   Pati Gallo S, PA  darunavir-cobicistat (PREZCOBIX) 800-150 MG tablet Take 1 tablet by mouth daily with breakfast. Patient not taking: Reported on 05/29/2019 12/26/16   Kuppelweiser, Cassie L, RPH-CPP  famotidine (PEPCID) 40 MG tablet Take 1 tablet (40 mg total) by mouth daily for 14 days. 05/29/19 06/12/19  Tedd Sias, PA  fluconazole (DIFLUCAN) 200 MG tablet Take 1 tablet (200 mg total) by mouth once a week for 5 doses. 05/29/19 06/27/19  Tedd Sias, PA  lactobacillus acidophilus  & bulgar (LACTINEX) chewable tablet Chew 1 tablet by mouth 3 (three) times daily with meals. Patient not taking: Reported on 11/24/2018 07/11/16   Antonietta Breach, PA-C  loperamide (IMODIUM) 2 MG capsule Take 1 capsule (2 mg total) by mouth every 8 (eight) hours as needed for diarrhea or loose stools. Patient not taking: Reported on 06/13/2017 07/11/16   Antonietta Breach, PA-C  mirtazapine (REMERON) 7.5 MG tablet Take 1 tablet (7.5 mg total) by mouth at bedtime. 11/28/18   [name], MD  QUEtiapine (SEROQUEL) 200 MG tablet Take 1 tablet (200 mg total) by mouth at bedtime. 11/28/18   [name], MD    ALLERGIES:  No Known Allergies  SOCIAL HISTORY:  Social History   Tobacco Use  . Smoking status: Current Every Day Smoker    Packs/day: 0.50    Years: 25.00    Pack years: 12.50    Types: Cigarettes  . Smokeless tobacco: Never Used  . Tobacco comment: cutting back  Substance Use Topics  . Alcohol use: Yes    Alcohol/week: 2.0 standard drinks    Types: 2 Standard drinks or equivalent per week    Comment: 3-4 days a week.Last drink: today    FAMILY HISTORY: Family History  Problem Relation Age of Onset  . Diabetes Mother   . Hypertension Mother   . Arthritis Mother     EXAM: BP 102/88 (BP Location: Left Arm)   Pulse (!) 109   Temp 98 F (36.7 C) (Oral)   Resp 16   SpO2 96%  CONSTITUTIONAL: Alert and oriented and responds appropriately  to questions.  Thin, chronically ill-appearing but nontoxic-appearing HEAD: Normocephalic EYES: Conjunctivae clear, pupils appear equal, EOM appear intact ENT: normal nose; dry mucous membranes, no signs of thrush on exam NECK: Supple, normal ROM CARD: Regular and intermittently tachycardic; S1 and S2 appreciated; no murmurs, no clicks, no rubs, no gallops RESP: Normal chest excursion without splinting or tachypnea; breath sounds clear and equal bilaterally; no wheezes, no rhonchi, no rales, no hypoxia or respiratory distress, speaking full  sentences ABD/GI: Normal bowel sounds; non-distended; soft, non-tender, no rebound, no guarding, no peritoneal signs, no hepatosplenomegaly BACK:  The back appears normal EXT: Normal ROM in all joints; no deformity noted, no edema; no cyanosis SKIN: Normal color for age and race; warm; no rash on exposed skin NEURO: Moves all extremities equally PSYCH: The patient's mood and manner are appropriate.   MEDICAL DECISION MAKING: Patient here with symptoms of viral illness.  Did have a CD4 count of less than 35 in June.  History of medical noncompliance.  Will obtain labs, urine, chest x-ray, blood cultures.  Will swab for Covid.  Patient afebrile nontoxic-appearing now.  Will treat symptomatically with Toradol, Zofran and IV fluids.  Outside of treatment window for medications for influenza.  Patient also requesting CD4 and viral load testing today.  Have discussed with patient that we can obtain these labs but they will need to be followed by his infectious disease physician.  ED PROGRESS: Patient's labs show new renal failure with creatinine of 4.28.  Was normal several days ago.  He is leukopenic which is chronic.  Lactate normal.  Chest x-ray clear.  Point-of-care rapid Covid antigen test was negative.  Will continue IV hydration and admit to medicine.  Patient has had some intermittent hypotension which I suspect is from hypovolemia from dehydration.  He has not had any vomiting or diarrhea here in the emergency department and his abdominal exam is benign.  I do not think this is from sepsis.  His lactate is normal.  He meets no other criteria other than his leukopenia which is a chronic issue for him secondary to his HIV.  He is not neutropenic.  We will hold on antibiotics at this time and continue to aggressively hydrate.  2:08 AM Discussed patient's case with hospitalist, Dr. Welton Flakes.  I have recommended admission and patient (and family if present) agree with this plan. Admitting physician will  place admission orders.   Hospitalist has requested that I order stool studies including C. difficile today.  I reviewed all nursing notes, vitals, pertinent previous records and interpreted all EKGs, lab and urine results, imaging (as available).       CRITICAL CARE Performed by: Rochele Raring   Total critical care time: 45 minutes  Critical care time was exclusive of separately billable procedures and treating other patients.  Critical care was necessary to treat or prevent imminent or life-threatening deterioration.  Critical care was time spent personally by me on the following activities: development of treatment plan with patient and/or surrogate as well as nursing, discussions with consultants, evaluation of patient's response to treatment, examination of patient, obtaining history from patient or surrogate, ordering and performing treatments and interventions, ordering and review of laboratory studies, ordering and review of radiographic studies, pulse oximetry and re-evaluation of patient's condition.   Kurt Foley was evaluated in Emergency Department on 06/01/2019 for the symptoms described in the history of present illness. He was evaluated in the context of the global COVID-19 pandemic, which necessitated consideration that  the patient might be at risk for infection with the SARS-CoV-2 virus that causes COVID-19. Institutional protocols and algorithms that pertain to the evaluation of patients at risk for COVID-19 are in a state of rapid change based on information released by regulatory bodies including the CDC and federal and state organizations. These policies and algorithms were followed during the patient's care in the ED.  Patient was seen wearing N95, face shield, gloves, gown.    Temitope Flammer, Layla MawKristen N, DO 06/02/19 [phone]

## 2019-06-01 NOTE — ED Triage Notes (Signed)
Per EMS: Pt is coming from home with c/o fatigue. Pt is feeling weak and dizzy. Pt denies SOB but states he has lost his taste and smell. Pt is ambulatory. Pt reports starting a  new medication that is giving him diarrhea. Pt reports 8/10 generalized pain.  EMS VITALS: BP 118/60 HR 123 SPO2 98 TEMP 98.6

## 2019-06-02 ENCOUNTER — Encounter (HOSPITAL_COMMUNITY): Payer: Self-pay | Admitting: Internal Medicine

## 2019-06-02 DIAGNOSIS — R197 Diarrhea, unspecified: Secondary | ICD-10-CM | POA: Diagnosis present

## 2019-06-02 DIAGNOSIS — R531 Weakness: Secondary | ICD-10-CM

## 2019-06-02 DIAGNOSIS — R112 Nausea with vomiting, unspecified: Secondary | ICD-10-CM

## 2019-06-02 DIAGNOSIS — N179 Acute kidney failure, unspecified: Secondary | ICD-10-CM | POA: Diagnosis present

## 2019-06-02 DIAGNOSIS — E860 Dehydration: Secondary | ICD-10-CM | POA: Insufficient documentation

## 2019-06-02 LAB — COMPREHENSIVE METABOLIC PANEL
ALT: 32 U/L (ref 0–44)
AST: 60 U/L — ABNORMAL HIGH (ref 15–41)
Albumin: 4.4 g/dL (ref 3.5–5.0)
Alkaline Phosphatase: 132 U/L — ABNORMAL HIGH (ref 38–126)
Anion gap: 16 — ABNORMAL HIGH (ref 5–15)
BUN: 39 mg/dL — ABNORMAL HIGH (ref 6–20)
CO2: 19 mmol/L — ABNORMAL LOW (ref 22–32)
Calcium: 9.4 mg/dL (ref 8.9–10.3)
Chloride: 100 mmol/L (ref 98–111)
Creatinine, Ser: 4.28 mg/dL — ABNORMAL HIGH (ref 0.61–1.24)
GFR calc Af Amer: 18 mL/min — ABNORMAL LOW (ref >60)
GFR calc non Af Amer: 16 mL/min — ABNORMAL LOW (ref >60)
Glucose, Bld: 89 mg/dL (ref 70–99)
Potassium: 4.5 mmol/L (ref 3.5–5.1)
Sodium: 135 mmol/L (ref 135–145)
Total Bilirubin: 0.3 mg/dL (ref 0.3–1.2)
Total Protein: 9.9 g/dL — ABNORMAL HIGH (ref 6.5–8.1)

## 2019-06-02 LAB — CBC
HCT: 38.2 % — ABNORMAL LOW (ref 39.0–52.0)
Hemoglobin: 12.7 g/dL — ABNORMAL LOW (ref 13.0–17.0)
MCH: 31.6 pg (ref 26.0–34.0)
MCHC: 33.2 g/dL (ref 30.0–36.0)
MCV: 95 fL (ref 80.0–100.0)
Platelets: 205 10*3/uL (ref 150–400)
RBC: 4.02 MIL/uL — ABNORMAL LOW (ref 4.22–5.81)
RDW: 13.2 % (ref 11.5–15.5)
WBC: 2.2 10*3/uL — ABNORMAL LOW (ref 4.0–10.5)
nRBC: 0 % (ref 0.0–0.2)

## 2019-06-02 LAB — URINALYSIS, ROUTINE W REFLEX MICROSCOPIC
Bilirubin Urine: NEGATIVE
Glucose, UA: NEGATIVE mg/dL
Hgb urine dipstick: NEGATIVE
Ketones, ur: 5 mg/dL — AB
Leukocytes,Ua: NEGATIVE
Nitrite: NEGATIVE
Protein, ur: 30 mg/dL — AB
Specific Gravity, Urine: 1.023 (ref 1.005–1.030)
pH: 5 (ref 5.0–8.0)

## 2019-06-02 LAB — C DIFFICILE QUICK SCREEN W PCR REFLEX
C Diff antigen: NEGATIVE
C Diff interpretation: NOT DETECTED
C Diff toxin: NEGATIVE

## 2019-06-02 LAB — CBC WITH DIFFERENTIAL/PLATELET
Abs Immature Granulocytes: 0.14 10*3/uL — ABNORMAL HIGH (ref 0.00–0.07)
Basophils Absolute: 0 10*3/uL (ref 0.0–0.1)
Basophils Relative: 1 %
Eosinophils Absolute: 0 10*3/uL (ref 0.0–0.5)
Eosinophils Relative: 1 %
HCT: 51.7 % (ref 39.0–52.0)
Hemoglobin: 16.9 g/dL (ref 13.0–17.0)
Immature Granulocytes: 5 %
Lymphocytes Relative: 18 %
Lymphs Abs: 0.5 10*3/uL — ABNORMAL LOW (ref 0.7–4.0)
MCH: 30.7 pg (ref 26.0–34.0)
MCHC: 32.7 g/dL (ref 30.0–36.0)
MCV: 94 fL (ref 80.0–100.0)
Monocytes Absolute: 0.4 10*3/uL (ref 0.1–1.0)
Monocytes Relative: 13 %
Neutro Abs: 1.8 10*3/uL (ref 1.7–7.7)
Neutrophils Relative %: 62 %
Platelets: 274 10*3/uL (ref 150–400)
RBC: 5.5 MIL/uL (ref 4.22–5.81)
RDW: 13 % (ref 11.5–15.5)
WBC: 2.9 10*3/uL — ABNORMAL LOW (ref 4.0–10.5)
nRBC: 0 % (ref 0.0–0.2)

## 2019-06-02 LAB — LIPASE, BLOOD: Lipase: 46 U/L (ref 11–51)

## 2019-06-02 LAB — CREATININE, SERUM
Creatinine, Ser: 2.82 mg/dL — ABNORMAL HIGH (ref 0.61–1.24)
GFR calc Af Amer: 30 mL/min — ABNORMAL LOW (ref >60)
GFR calc non Af Amer: 26 mL/min — ABNORMAL LOW (ref >60)

## 2019-06-02 LAB — LACTIC ACID, PLASMA: Lactic Acid, Venous: 1 mmol/L (ref 0.5–1.9)

## 2019-06-02 LAB — T-HELPER CELLS (CD4) COUNT (NOT AT ARMC)
CD4 % Helper T Cell: 2 % — ABNORMAL LOW (ref 33–65)
CD4 T Cell Abs: <35 /uL — ABNORMAL LOW (ref 400–1790)

## 2019-06-02 LAB — POC SARS CORONAVIRUS 2 AG -  ED: SARS Coronavirus 2 Ag: NEGATIVE

## 2019-06-02 MED ORDER — SODIUM CHLORIDE 0.9 % IV BOLUS (SEPSIS)
1000.0000 mL | Freq: Once | INTRAVENOUS | Status: AC
  Administered 2019-06-02: 02:00:00 1000 mL via INTRAVENOUS

## 2019-06-02 MED ORDER — MORPHINE SULFATE (PF) 2 MG/ML IV SOLN
2.0000 mg | INTRAVENOUS | Status: DC | PRN
  Administered 2019-06-02 – 2019-06-04 (×7): 2 mg via INTRAVENOUS
  Filled 2019-06-02 (×7): qty 1

## 2019-06-02 MED ORDER — SODIUM CHLORIDE 0.9 % IV SOLN: INTRAVENOUS | Status: DC

## 2019-06-02 MED ORDER — ACETAMINOPHEN 325 MG PO TABS
650.0000 mg | ORAL_TABLET | Freq: Four times a day (QID) | ORAL | Status: DC | PRN
  Administered 2019-06-02 (×2): 650 mg via ORAL
  Filled 2019-06-02 (×2): qty 2

## 2019-06-02 MED ORDER — HYDROCORTISONE (PERIANAL) 2.5 % EX CREA
TOPICAL_CREAM | Freq: Three times a day (TID) | CUTANEOUS | Status: DC
  Administered 2019-06-02: 1 via RECTAL
  Filled 2019-06-02: qty 28.35

## 2019-06-02 MED ORDER — MIRTAZAPINE 15 MG PO TABS
7.5000 mg | ORAL_TABLET | Freq: Every day | ORAL | Status: DC
  Administered 2019-06-02 – 2019-06-03 (×2): 7.5 mg via ORAL
  Filled 2019-06-02 (×2): qty 1

## 2019-06-02 MED ORDER — HEPARIN SODIUM (PORCINE) 5000 UNIT/ML IJ SOLN
5000.0000 [IU] | Freq: Three times a day (TID) | INTRAMUSCULAR | Status: DC
  Administered 2019-06-02 – 2019-06-04 (×5): 5000 [IU] via SUBCUTANEOUS
  Filled 2019-06-02 (×7): qty 1

## 2019-06-02 MED ORDER — QUETIAPINE FUMARATE 100 MG PO TABS
200.0000 mg | ORAL_TABLET | Freq: Every day | ORAL | Status: DC
  Administered 2019-06-02 – 2019-06-03 (×2): 200 mg via ORAL
  Filled 2019-06-02 (×2): qty 2

## 2019-06-02 MED ORDER — ONDANSETRON HCL 4 MG/2ML IJ SOLN
4.0000 mg | Freq: Four times a day (QID) | INTRAMUSCULAR | Status: DC | PRN
  Administered 2019-06-02: 14:00:00 4 mg via INTRAVENOUS
  Filled 2019-06-02: qty 2

## 2019-06-02 MED ORDER — ACETAMINOPHEN 650 MG RE SUPP: 650.0000 mg | Freq: Four times a day (QID) | RECTAL | Status: DC | PRN

## 2019-06-02 MED ORDER — ONDANSETRON HCL 4 MG PO TABS: 4.0000 mg | ORAL_TABLET | Freq: Four times a day (QID) | ORAL | Status: DC | PRN

## 2019-06-02 MED ORDER — WITCH HAZEL-GLYCERIN EX PADS
MEDICATED_PAD | CUTANEOUS | Status: DC | PRN
  Filled 2019-06-02: qty 100

## 2019-06-02 MED ORDER — LOPERAMIDE HCL 2 MG PO CAPS
2.0000 mg | ORAL_CAPSULE | Freq: Once | ORAL | Status: AC
  Administered 2019-06-03: 2 mg via ORAL
  Filled 2019-06-02: qty 1

## 2019-06-02 NOTE — ED Notes (Signed)
Pt given water and encouraged to drink 

## 2019-06-02 NOTE — ED Notes (Signed)
Pt given soup and crackers.

## 2019-06-02 NOTE — Progress Notes (Addendum)
Patient is a 43 year old male with history of HIV, depression, noncompliance , substance abuse, major depressive disorder with recurrent  who presents to the emergency department complains of generalized weakness, fatigue, several episodes of nausea, vomiting, loose stools started 4 days ago.  He was unable to eat or drink.  Patient states he is not taking his HIV medication currently.  He was following  with [name] but currently has not seen him for last 2 years because of his depression.  He was recently admitted and was [facility] after he was managed for major depressive disorder. On presentation he was found to be hypotensive, tachycardic, severe AKI.  Patient was started on IV fluids.  GI and C. difficile panel sent.  CT hematuria negative.  GI pathogen panel pending.  Covid-19 test pending.  His kidney function is improving with IV fluids.  Patient seen and examined at the bedside this morning.  He is feeling much better today.  Denies any abdomen pain, nausea or vomiting this morning.  Last bowel movement was yesterday. Patient seen and examined by Dr. Humphrey Rolls this morning.  I agree with assessment and plan. Plan is to discharge to home tomorrow with further improvement in the kidney function.  For now, we will continue IV fluids

## 2019-06-02 NOTE — ED Notes (Signed)
Patient aware urine sample is needed. Urinal at bedside. 

## 2019-06-02 NOTE — ED Notes (Signed)
Pt given breakfast tray

## 2019-06-02 NOTE — H&P (Signed)
History and Physical    Kurt Foley:[id number] DOB: 09-08-1975 DOA: 06/01/2019  PCP: Thayer Headings, MD   Patient coming from: Home    Chief Complaint: Nausea, vomiting and diarrhea.  HPI: Kurt Foley is a 43 y.o. male with medical history significant of HIV and depression presented to ED for evaluation of severe generalized weakness and fatigue.  Patient states that he is having severe nausea with multiple episodes of vomiting and loose stools started about 4 days ago and continue to worsen.  Patient denies seeing any blood in his vomiting and stools.  Patient states that he is unable to eat and drink well because of severe nausea and occasionally able to tolerate some soft diet otherwise every time he puts something in his mouth he vomits.  Patient state he also have cough but this is a chronic cough going on for years and is not changed from the baseline.  Patient otherwise denies fever chills, chest pain, abdominal pain and urinary symptoms.  Patient states that he is compliant with his HIV medications.  Patient also denies any recent sick contacts.  ED Course: In ED patient was hypertensive and tachycardic with normal temperature and was saturating well on room air.  Patient also had elevated creatinine to 4.8 secondary to severe dehydration from vomiting and poor oral intake.  In ED patient was given 2 L of normal saline for severe dehydration along with injection Zofran and ketorolac.  Review of Systems: As per HPI otherwise 10 point review of systems negative.    Past Medical History:  Diagnosis Date  . Depression   . HIV infection ([place])     No past surgical history on file.   reports that he has been smoking cigarettes. He has a 12.50 pack-year smoking history. He has never used smokeless tobacco. He reports current alcohol use of about 2.0 standard drinks of alcohol per week. He reports current drug use. Drugs: Marijuana and Cocaine.  No Known  Allergies  Family History  Problem Relation Age of Onset  . Diabetes Mother   . Hypertension Mother   . Arthritis Mother      Prior to Admission medications   Medication Sig Start Date End Date Taking? Authorizing Provider  famotidine (PEPCID) 40 MG tablet Take 1 tablet (40 mg total) by mouth daily for 14 days. 05/29/19 06/12/19 Yes Fondaw, Wylder S, PA  fluconazole (DIFLUCAN) 200 MG tablet Take 1 tablet (200 mg total) by mouth once a week for 5 doses. 05/29/19 06/27/19 Yes Fondaw, Wylder S, PA  mirtazapine (REMERON) 7.5 MG tablet Take 1 tablet (7.5 mg total) by mouth at bedtime. 11/28/18  Yes [name], MD  QUEtiapine (SEROQUEL) 200 MG tablet Take 1 tablet (200 mg total) by mouth at bedtime. 11/28/18  Yes [name], MD  abacavir-dolutegravir-lamiVUDine (TRIUMEQ) 600-50-300 MG tablet Take 1 tablet by mouth daily. Patient not taking: Reported on 05/29/2019 12/26/16   Kuppelweiser, Cassie L, RPH-CPP  clotrimazole (LOTRIMIN) 1 % cream Apply to affected area 2 times daily Patient not taking: Reported on 06/02/2019 05/29/19   Tedd Sias, PA  darunavir-cobicistat (PREZCOBIX) 800-150 MG tablet Take 1 tablet by mouth daily with breakfast. Patient not taking: Reported on 05/29/2019 12/26/16   Kuppelweiser, Cassie L, RPH-CPP  lactobacillus acidophilus & bulgar (LACTINEX) chewable tablet Chew 1 tablet by mouth 3 (three) times daily with meals. Patient not taking: Reported on 11/24/2018 07/11/16   Antonietta Breach, PA-C  loperamide (IMODIUM) 2 MG capsule Take 1 capsule (2  mg total) by mouth every 8 (eight) hours as needed for diarrhea or loose stools. Patient not taking: Reported on 06/13/2017 07/11/16   Antony MaduraHumes, Kelly, PA-C    Physical Exam: Vitals:   06/02/19 0130 06/02/19 0230 06/02/19 0330 06/02/19 0400  BP: 91/66 (!) 85/71 (!) 115/91 113/88  Pulse: 91 83 85 80  Resp: 16 16 15 16   Temp:      TempSrc:      SpO2: 99% 99% 98% 98%    Constitutional: NAD, calm, comfortable Vitals:   06/02/19  0130 06/02/19 0230 06/02/19 0330 06/02/19 0400  BP: 91/66 (!) 85/71 (!) 115/91 113/88  Pulse: 91 83 85 80  Resp: 16 16 15 16   Temp:      TempSrc:      SpO2: 99% 99% 98% 98%   CONSTITUTIONAL: Alert and oriented but look sick weak and cachectic. Eyes: PERRL, lids and conjunctivae normal ENMT: Mucous membranes are dry. Posterior pharynx clear of any exudate or lesions.Normal dentition.  Neck: normal, supple, no masses, no thyromegaly Respiratory: clear to auscultation bilaterally, no wheezing, no crackles. Normal respiratory effort. No accessory muscle use.  Cardiovascular: Regular rate and rhythm, no murmurs / rubs / gallops. No extremity edema. 2+ pedal pulses. No carotid bruits.  Abdomen: no tenderness, no masses palpated. No hepatosplenomegaly. Bowel sounds positive and hyperactive.  Musculoskeletal: no clubbing / cyanosis. No joint deformity upper and lower extremities. Good ROM, no contractures. Normal muscle tone.  Skin: no rashes, lesions, ulcers. No induration Neurologic: CN 2-12 grossly intact. Sensation intact, DTR normal. Strength 5/5 in all 4.  Psychiatric: Normal judgment and insight. Alert and oriented x 3. Normal mood.    Labs on Admission: I have personally reviewed following labs and imaging studies  CBC: Recent Labs  Lab 05/29/19 1326 06/02/19 0006  WBC 2.2* 2.9*  NEUTROABS  --  1.8  HGB 13.6 16.9  HCT 41.5 51.7  MCV 95.0 94.0  PLT 246 274   Basic Metabolic Panel: Recent Labs  Lab 05/29/19 1326 06/02/19 0006  NA 139 135  K 4.3 4.5  CL 103 100  CO2 28 19*  GLUCOSE 79 89  BUN 7 39*  CREATININE 0.85 4.28*  CALCIUM 9.1 9.4   GFR: CrCl cannot be calculated (Unknown ideal weight.). Liver Function Tests: Recent Labs  Lab 05/29/19 1326 06/02/19 0006  AST 40 60*  ALT 20 32  ALKPHOS 116 132*  BILITOT 0.4 0.3  PROT 8.3* 9.9*  ALBUMIN 3.7 4.4   Recent Labs  Lab 05/29/19 1326 06/02/19 0006  LIPASE 34 46   No results for input(s): AMMONIA in the  last 168 hours. Coagulation Profile: No results for input(s): INR, PROTIME in the last 168 hours. Cardiac Enzymes: No results for input(s): CKTOTAL, CKMB, CKMBINDEX, TROPONINI in the last 168 hours. BNP (last 3 results) No results for input(s): PROBNP in the last 8760 hours. HbA1C: No results for input(s): HGBA1C in the last 72 hours. CBG: No results for input(s): GLUCAP in the last 168 hours. Lipid Profile: No results for input(s): CHOL, HDL, LDLCALC, TRIG, CHOLHDL, LDLDIRECT in the last 72 hours. Thyroid Function Tests: No results for input(s): TSH, T4TOTAL, FREET4, T3FREE, THYROIDAB in the last 72 hours. Anemia Panel: No results for input(s): VITAMINB12, FOLATE, FERRITIN, TIBC, IRON, RETICCTPCT in the last 72 hours. Urine analysis:    Component Value Date/Time   COLORURINE AMBER (A) 06/02/2019 0006   APPEARANCEUR CLOUDY (A) 06/02/2019 0006   LABSPEC 1.023 06/02/2019 0006   PHURINE 5.0 06/02/2019  0006   GLUCOSEU NEGATIVE 06/02/2019 0006   HGBUR NEGATIVE 06/02/2019 0006   BILIRUBINUR NEGATIVE 06/02/2019 0006   KETONESUR 5 (A) 06/02/2019 0006   PROTEINUR 30 (A) 06/02/2019 0006   UROBILINOGEN 1.0 03/20/2015 0000   NITRITE NEGATIVE 06/02/2019 0006   LEUKOCYTESUR NEGATIVE 06/02/2019 0006    Radiological Exams on Admission: DG Chest Portable 1 View  Result Date: 06/02/2019 CLINICAL DATA:  Shortness of breath. EXAM: PORTABLE CHEST 1 VIEW COMPARISON:  05/29/2019 FINDINGS: The heart size and mediastinal contours are within normal limits. Both lungs are clear. The visualized skeletal structures are unremarkable. There are old healed left-sided rib fractures. IMPRESSION: No active disease. Electronically Signed   By: Katherine Mantle M.D.   On: 06/02/2019 00:10    Assessment/Plan Principal Problem: Intractable nausea vomiting and diarrhea Continue IV fluids to keep the patient hydrated. Zofran for nausea and vomiting as needed C. difficile ordered Soft diet ordered Patient is  encouraged to drink fluids. Patient also had hypotension and the last blood pressure is 100/65. Continue to monitor  Active Problems:   Human immunodeficiency virus (HIV) disease (HCC) Continue medications as prescribed by infectious disease    AKI (acute kidney injury) (HCC) Acute kidney injury is from severe dehydration secondary to intractable nausea vomiting and poor oral intake.  Continue IV fluids and Zofran.      Generalized weakness  Most likely secondary to dehydration poor oral intake Continue IV fluids   DVT prophylaxis: Heparin Code Status: Full code Admission status: OBSERVATION-MedSurg   Thalia Party MD [facility]  If 7PM-7AM, please contact night-coverage [email or web address] Password   06/02/2019, 5:37 AM

## 2019-06-02 NOTE — Progress Notes (Signed)
Patient had diarrhea episode overnight and sated its been multiple times today. Patient asked for Imodium says he's took it before at home. Will page on call.

## 2019-06-02 NOTE — ED Notes (Signed)
ED TO INPATIENT HANDOFF REPORT  Name/Age/Gender Kurt Foley 43 y.o. male  Code Status    Code Status Orders  (From admission, onward)         Start     Ordered   06/02/19 0443  Full code  Continuous     06/02/19 0444        Code Status History    Date Active Date Inactive Code Status Order ID Comments User Context   11/25/2018 1810 11/28/2018 1921 Full Code [id number]  Patrecia Pour, NP Inpatient   11/25/2018 0351 11/25/2018 1738 Full Code [id number]  Margette Fast, MD ED   11/24/2018 2056 11/25/2018 0351 Full Code [id number]  Francine Graven, DO ED   10/09/2015 2143 10/12/2015 1706 Full Code [id number]  Delfin Gant, NP Inpatient   10/09/2015 0457 10/09/2015 2143 Full Code [id number]  Charlann Lange, PA-C ED   09/15/2015 0042 09/16/2015 1830 Full Code [id number]  Ronnie Doss, RN Inpatient   09/14/2015 1705 09/14/2015 2334 Full Code [id number]  Merrily Pew, MD ED   04/07/2015 1833 04/15/2015 2119 Full Code [id number]  Kerrie Buffalo, NP Inpatient   04/06/2015 2106 04/07/2015 1833 Full Code [id number]  Junius Creamer, NP ED   04/06/2015 2105 04/06/2015 2106 Full Code [id number]  Junius Creamer, NP ED   03/20/2015 0434 03/21/2015 1547 Full Code [id number]  Theressa Millard, MD Inpatient   Advance Care Planning Activity      Home/SNF/Other Home  Chief Complaint Generalized weakness [R53.1] AKI (acute kidney injury) ([place]) [N17.9]  Level of Care/Admitting Diagnosis ED Disposition    ED Disposition Condition [place] [facility] Area: [facility] [[id number]]  Level of Care: Med-Surg [16]  Covid Evaluation: Asymptomatic Screening Protocol (No Symptoms)  Diagnosis: AKI (acute kidney injury) [facility]) [[id number]]  Admitting Physician: Shelly Coss [[id number]]  Attending Physician: Shelly Coss [[id number]]  Estimated length of stay: past midnight tomorrow  Certification:: I certify this patient will need inpatient services for at least 2 midnights        Medical History Past Medical History:  Diagnosis Date  . Depression   . HIV infection ([place])     Allergies No Known Allergies  IV Location/Drains/Wounds Patient Lines/Drains/Airways Status   Active Line/Drains/Airways    Name:   Placement date:   Placement time:   Site:   Days:   Peripheral IV 06/02/19 Right Antecubital   06/02/19    0000    Antecubital   less than 1          Labs/Imaging Results for orders placed or performed during the hospital encounter of 06/01/19 (from the past 48 hour(s))  CBC with Differential     Status: Abnormal   Collection Time: 06/02/19 12:06 AM  Result Value Ref Range   WBC 2.9 (L) 4.0 - 10.5 K/uL   RBC 5.50 4.22 - 5.81 MIL/uL   Hemoglobin 16.9 13.0 - 17.0 g/dL   HCT 51.7 39.0 - 52.0 %   MCV 94.0 80.0 - 100.0 fL   MCH 30.7 26.0 - 34.0 pg   MCHC 32.7 30.0 - 36.0 g/dL   RDW 13.0 11.5 - 15.5 %   Platelets 274 150 - 400 K/uL   nRBC 0.0 0.0 - 0.2 %   Neutrophils Relative % 62 %   Neutro Abs 1.8 1.7 - 7.7 K/uL   Lymphocytes Relative 18 %   Lymphs Abs 0.5 (L) 0.7 - 4.0 K/uL   Monocytes Relative 13 %   Monocytes Absolute  0.4 0.1 - 1.0 K/uL   Eosinophils Relative 1 %   Eosinophils Absolute 0.0 0.0 - 0.5 K/uL   Basophils Relative 1 %   Basophils Absolute 0.0 0.0 - 0.1 K/uL   Immature Granulocytes 5 %   Abs Immature Granulocytes 0.14 (H) 0.00 - 0.07 K/uL    Comment: Performed at [facility], [place] [address], [place], [place] [address]  Comprehensive metabolic panel     Status: Abnormal   Collection Time: 06/02/19 12:06 AM  Result Value Ref Range   Sodium 135 135 - 145 mmol/L   Potassium 4.5 3.5 - 5.1 mmol/L   Chloride 100 98 - 111 mmol/L   CO2 19 (L) 22 - 32 mmol/L   Glucose, Bld 89 70 - 99 mg/dL   BUN 39 (H) 6 - 20 mg/dL   Creatinine, Ser 4.28 (H) 0.61 - 1.24 mg/dL   Calcium 9.4 8.9 - 10.3 mg/dL   Total Protein 9.9 (H) 6.5 - 8.1 g/dL   Albumin 4.4 3.5 - 5.0 g/dL   AST 60 (H) 15 - 41 U/L   ALT 32 0 - 44 U/L    Alkaline Phosphatase 132 (H) 38 - 126 U/L   Total Bilirubin 0.3 0.3 - 1.2 mg/dL   GFR calc non Af Amer 16 (L) >60 mL/min   GFR calc Af Amer 18 (L) >60 mL/min   Anion gap 16 (H) 5 - 15    Comment: Performed at [facility], [place] [address], [place], [place] [address]  Lipase, blood     Status: None   Collection Time: 06/02/19 12:06 AM  Result Value Ref Range   Lipase 46 11 - 51 U/L    Comment: Performed at [facility], [place] [name] [place], [place] [address]  Urinalysis, Routine w reflex microscopic     Status: Abnormal   Collection Time: 06/02/19 12:06 AM  Result Value Ref Range   Color, Urine AMBER (A) YELLOW    Comment: BIOCHEMICALS MAY BE AFFECTED BY COLOR   APPearance CLOUDY (A) CLEAR   Specific Gravity, Urine 1.023 1.005 - 1.030   pH 5.0 5.0 - 8.0   Glucose, UA NEGATIVE NEGATIVE mg/dL   Hgb urine dipstick NEGATIVE NEGATIVE   Bilirubin Urine NEGATIVE NEGATIVE   Ketones, ur 5 (A) NEGATIVE mg/dL   Protein, ur 30 (A) NEGATIVE mg/dL   Nitrite NEGATIVE NEGATIVE   Leukocytes,Ua NEGATIVE NEGATIVE   RBC / HPF 0-5 0 - 5 RBC/hpf   WBC, UA 0-5 0 - 5 WBC/hpf   Bacteria, UA RARE (A) NONE SEEN   Squamous Epithelial / LPF 6-10 0 - 5   Mucus PRESENT    Hyaline Casts, UA PRESENT     Comment: Performed at [facility], [place] [address] [place], [place] [address]  T-helper cells (CD4) count (not at [facility])     Status: Abnormal   Collection Time: 06/02/19 12:43 AM  Result Value Ref Range   CD4 T Cell Abs <35 (L) 400 - 1,790 /uL    Comment: SPECIMEN CHECKED FOR CLOTS REPEATED TO VERIFY    CD4 % Helper T Cell 2 (L) 33 - 65 %    Comment: SPECIMEN CHECKED FOR CLOTS REPEATED TO VERIFY Performed at [facility], [place] [address] [place], [place] [address]   Lactic acid, plasma     Status: None   Collection Time: 06/02/19 12:43 AM  Result Value Ref Range   Lactic Acid, Venous 1.0 0.5 -  1.9 mmol/L    Comment:  Performed at [facility], [place] [address], [place], [place] [address]  POC SARS Coronavirus 2 Ag-ED - Nasal Swab (BD Veritor Kit)     Status: None   Collection Time: 06/02/19  1:41 AM  Result Value Ref Range   SARS Coronavirus 2 Ag NEGATIVE NEGATIVE    Comment: (NOTE) SARS-CoV-2 antigen NOT DETECTED.  Negative results are presumptive.  Negative results do not preclude SARS-CoV-2 infection and should not be used as the sole basis for treatment or other patient management decisions, including infection  control decisions, particularly in the presence of clinical signs and  symptoms consistent with COVID-19, or in those who have been in contact with the virus.  Negative results must be combined with clinical observations, patient history, and epidemiological information. The expected result is Negative. Fact Sheet for Patients: PodPark.tn Fact Sheet for Healthcare Providers: GiftContent.is This test is not yet approved or cleared by the [place] FDA and  has been authorized for detection and/or diagnosis of SARS-CoV-2 by FDA under an Emergency Use Authorization (EUA).  This EUA will remain in effect (meaning this test can be used) for the duration of  the COVID-19 de claration under Section 564(b)(1) of the Act, 21 U.S.C. section 360bbb-3(b)(1), unless the authorization is terminated or revoked sooner.   C difficile quick scan w PCR reflex     Status: None   Collection Time: 06/02/19  2:08 AM   Specimen: Urine, Clean Catch; Stool  Result Value Ref Range   C Diff antigen NEGATIVE NEGATIVE   C Diff toxin NEGATIVE NEGATIVE   C Diff interpretation No C. difficile detected.     Comment: Performed at [facility], [place] [address] [place], [place] [address]  CBC     Status: Abnormal   Collection Time: 06/02/19  5:19 AM  Result Value Ref Range   WBC 2.2 (L) 4.0 - 10.5 K/uL   RBC 4.02 (L)  4.22 - 5.81 MIL/uL   Hemoglobin 12.7 (L) 13.0 - 17.0 g/dL   HCT 38.2 (L) 39.0 - 52.0 %   MCV 95.0 80.0 - 100.0 fL   MCH 31.6 26.0 - 34.0 pg   MCHC 33.2 30.0 - 36.0 g/dL   RDW 13.2 11.5 - 15.5 %   Platelets 205 150 - 400 K/uL   nRBC 0.0 0.0 - 0.2 %    Comment: Performed at [facility], [place] [address], [place], [place] [address]  Creatinine, serum     Status: Abnormal   Collection Time: 06/02/19  5:19 AM  Result Value Ref Range   Creatinine, Ser 2.82 (H) 0.61 - 1.24 mg/dL    Comment: DELTA CHECK NOTED   GFR calc non Af Amer 26 (L) >60 mL/min   GFR calc Af Amer 30 (L) >60 mL/min    Comment: Performed at [facility], [place] [address] [place], [place] [address]   DG Chest Portable 1 View  Result Date: 06/02/2019 CLINICAL DATA:  Shortness of breath. EXAM: PORTABLE CHEST 1 VIEW COMPARISON:  05/29/2019 FINDINGS: The heart size and mediastinal contours are within normal limits. Both lungs are clear. The visualized skeletal structures are unremarkable. There are old healed left-sided rib fractures. IMPRESSION: No active disease. Electronically Signed   By: Constance Holster M.D.   On: 06/02/2019 00:10    Pending Labs Unresulted Labs (From admission, onward)    Start     Ordered   06/02/19 0208  GI  pathogen panel by PCR, stool  (Gastrointestinal Panel by PCR, Stool                                                                                                                                                     *Does Not include CLOSTRIDIUM DIFFICILE testing.**If CDIFF testing is needed, select the C Difficile Quick Screen w PCR reflex order below)  ONCE - STAT,   STAT     06/02/19 0207   06/02/19 0018  HIV-1 RNA quant-no reflex-bld  Once,   STAT     06/02/19 0017   06/02/19 0018  Blood culture (routine x 2)  BLOOD CULTURE X 2,   STAT     06/02/19 0017   06/01/19 2341  Novel Coronavirus, NAA (Hosp order, Send-out to Ref Lab; TAT 18-24 hrs  (Tier 4  Symptomatic (TAT 24-48 hrs))  ONCE - STAT,   STAT    Question Answer Comment  Is this test for diagnosis or screening Diagnosis of ill patient   Symptomatic for COVID-19 as defined by CDC Yes   Date of Symptom Onset 05/25/2019   Hospitalized for COVID-19 Yes   Admitted to ICU for COVID-19 No   Previously tested for COVID-19 Yes   Resident in a congregate (group) care setting Unknown   Employed in healthcare setting Unknown      06/01/19 2340          Vitals/Pain Today's Vitals   06/02/19 0600 06/02/19 0630 06/02/19 0700 06/02/19 1115  BP: 112/87 102/82 106/79 116/90  Pulse: 75 77 76 86  Resp: '15 15 16 18  '$ Temp:      TempSrc:      SpO2: 92% 96% 97% 97%  PainSc:        Isolation Precautions Enteric precautions (UV disinfection)  Medications Medications  0.9 %  sodium chloride infusion ( Intravenous Stopped 06/02/19 1131)  heparin injection 5,000 Units (5,000 Units Subcutaneous Given 06/02/19 0518)  0.9 %  sodium chloride infusion ( Intravenous New Bag/Given 06/02/19 0519)  acetaminophen (TYLENOL) tablet 650 mg (650 mg Oral Given 06/02/19 1154)    Or  acetaminophen (TYLENOL) suppository 650 mg ( Rectal See Alternative 06/02/19 1154)  ondansetron (ZOFRAN) tablet 4 mg (has no administration in time range)    Or  ondansetron (ZOFRAN) injection 4 mg (has no administration in time range)  ketorolac (TORADOL) 30 MG/ML injection 30 mg (30 mg Intravenous Given 06/02/19 0001)  ondansetron (ZOFRAN) injection 4 mg (4 mg Intravenous Given 06/02/19 0001)  sodium chloride 0.9 % bolus 1,000 mL (0 mLs Intravenous Stopped 06/02/19 0052)  sodium chloride 0.9 % bolus 1,000 mL (0 mLs Intravenous Stopped 06/02/19 0220)    Mobility walks

## 2019-06-03 ENCOUNTER — Inpatient Hospital Stay (HOSPITAL_COMMUNITY): Payer: Self-pay

## 2019-06-03 LAB — BASIC METABOLIC PANEL
Anion gap: 7 (ref 5–15)
BUN: 11 mg/dL (ref 6–20)
CO2: 17 mmol/L — ABNORMAL LOW (ref 22–32)
Calcium: 7.6 mg/dL — ABNORMAL LOW (ref 8.9–10.3)
Chloride: 114 mmol/L — ABNORMAL HIGH (ref 98–111)
Creatinine, Ser: 0.85 mg/dL (ref 0.61–1.24)
GFR calc Af Amer: >60 mL/min (ref >60)
GFR calc non Af Amer: >60 mL/min (ref >60)
Glucose, Bld: 89 mg/dL (ref 70–99)
Potassium: 3.9 mmol/L (ref 3.5–5.1)
Sodium: 138 mmol/L (ref 135–145)

## 2019-06-03 LAB — HIV-1 RNA QUANT-NO REFLEX-BLD
HIV 1 RNA Quant: 165000 copies/mL
LOG10 HIV-1 RNA: 5.217 log10copy/mL

## 2019-06-03 LAB — NOVEL CORONAVIRUS, NAA (HOSP ORDER, SEND-OUT TO REF LAB; TAT 18-24 HRS): SARS-CoV-2, NAA: NOT DETECTED

## 2019-06-03 MED ORDER — IOHEXOL 9 MG/ML PO SOLN
ORAL | Status: AC
  Filled 2019-06-03: qty 1000

## 2019-06-03 MED ORDER — CIPROFLOXACIN IN D5W 400 MG/200ML IV SOLN
400.0000 mg | Freq: Two times a day (BID) | INTRAVENOUS | Status: DC
  Administered 2019-06-04 (×2): 400 mg via INTRAVENOUS
  Filled 2019-06-03 (×2): qty 200

## 2019-06-03 MED ORDER — METRONIDAZOLE IN NACL 5-0.79 MG/ML-% IV SOLN
500.0000 mg | Freq: Three times a day (TID) | INTRAVENOUS | Status: DC
  Administered 2019-06-03 – 2019-06-04 (×3): 500 mg via INTRAVENOUS
  Filled 2019-06-03 (×4): qty 100

## 2019-06-03 MED ORDER — SODIUM CHLORIDE (PF) 0.9 % IJ SOLN
INTRAMUSCULAR | Status: AC
  Filled 2019-06-03: qty 50

## 2019-06-03 MED ORDER — IOHEXOL 300 MG/ML  SOLN
100.0000 mL | Freq: Once | INTRAMUSCULAR | Status: AC | PRN
  Administered 2019-06-03: 17:00:00 100 mL via INTRAVENOUS

## 2019-06-03 MED ORDER — SODIUM CHLORIDE 0.9 % IV SOLN: INTRAVENOUS | Status: DC

## 2019-06-03 MED ORDER — IOHEXOL 9 MG/ML PO SOLN
500.0000 mL | ORAL | Status: AC
  Administered 2019-06-03 (×2): 500 mL via ORAL

## 2019-06-03 MED ORDER — CIPROFLOXACIN IN D5W 200 MG/100ML IV SOLN
200.0000 mg | Freq: Two times a day (BID) | INTRAVENOUS | Status: DC
  Administered 2019-06-03: 14:00:00 200 mg via INTRAVENOUS
  Filled 2019-06-03: qty 100

## 2019-06-03 NOTE — Progress Notes (Signed)
PHARMACY NOTE:  ANTIMICROBIAL DOSAGE ADJUSTMENT  Current antimicrobial regimen includes a mismatch between antimicrobial dosage and estimated renal function.  As per policy approved by the Pharmacy & Therapeutics and Medical Executive Committees, the antimicrobial dosage will be adjusted accordingly.  Current antimicrobial dosage:  Cipro 200mg  IV q12h  Indication: Intra-abdominal infection  Renal Function:  Estimated Creatinine Clearance: 85.3 mL/min (by C-G formula based on SCr of 0.85 mg/dL).     Antimicrobial dosage has been changed to:  Cipro 400mg  IV q12h   Thank you for allowing pharmacy to be a part of this patient's care.  Everette Rank, [facility] 06/03/2019 2:25 PM

## 2019-06-03 NOTE — Progress Notes (Signed)
PROGRESS NOTE    CORDARRO SPINNATO  CBJ:[id number] DOB: September 04, 1975 DOA: 06/01/2019 PCP: Thayer Headings, MD   Brief Narrative:  Patient is a 43 year old male with history of HIV, depression, noncompliance , substance abuse, major depressive disorder with recurrent  who presents to the emergency department complains of generalized weakness, fatigue, several episodes of nausea, vomiting, loose stools started 4 days ago.  He was unable to eat or drink.  Patient states he is not taking his HIV medication currently.  He was following  with [name] but currently has not seen him for last 2 years because of his depression.  He was recently admitted and was discharged from behavioral health hospital on 6/20 after he was managed for major depressive disorder. On presentation he was found to be hypotensive, tachycardic, severe AKI.  Patient was started on IV fluids.  GI and C. difficile panel sent.  C diff negative. GI pathogen panel pending.  Covid-19 test pending.  His kidney function is improving with IV fluids.  Patient's abdomen pain, nausea and vomiting have improved but he still having diarrhea.  Discharge planning canceled.Awaiting GI pathogen panel.  Assessment & Plan:   Principal Problem:   Nausea vomiting and diarrhea Active Problems:   Human immunodeficiency virus (HIV) disease (HCC)   AKI (acute kidney injury) ([facility])   Nausea and vomiting in adult   Generalized weakness   Nausea, vomiting, diarrhea, abdominal pain: Due to unknown etiology.  Nausea, vomiting and abdominal pain have improved but he has persistent diarrhea. Could be infectious diarrhoea.  C. difficile negative.  GI pathogen panel pending.  I will start on IV antibiotics.  I will get CT abdomen/pelvis with contrast to R/O colitis  HIV disease: Noncompliant.  Currently not taking his antiretrovirals.  CD4 less than 35.  He was following with Dr. Alton Revere but he has not followed up for last 2 years.  I have requested for  compliance and follow-up with infectious disease.  Leukopenia: Most likely secondary to HIV.  Continue to monitor  AKI: Resolved with IV fluids  Depression: On Remeron and Seroquel.  He was recently admitted into behavioral health Hospital for severe major depression.         DVT prophylaxis: Heparin [place] Code Status: Full Family Communication: None present at the bedside Disposition Plan: Likely home tomorrow   Consultants: None  Procedures: None  Antimicrobials:  Anti-infectives (From admission, onward)   None      Subjective:  Patient seen and examined the bedside this morning.  Currently stable.  Abdomen pain is better.  Tolerating soft diet but he is having diarrhea. Does not feel ready to go home.  Objective: Vitals:   06/02/19 2045 06/03/19 0428 06/03/19 0500 06/03/19 1300  BP: 106/78 (!) 121/91 107/73 (!) 131/95  Pulse: 91 (!) 115 (!) 106 (!) 101  Resp: 20 16 16    Temp: 99.3 F (37.4 C) 99.7 F (37.6 C)  99.9 F (37.7 C)  TempSrc: Oral Oral  Oral  SpO2: 99% 98%  99%  Weight:      Height:        Intake/Output Summary (Last 24 hours) at 06/03/2019 1326 Last data filed at 06/03/2019 1318 Gross per 24 hour  Intake 1981.04 ml  Output 1425 ml  Net 556.04 ml   Filed Weights   06/02/19 1427  Weight: 54.4 kg    Examination:  General exam: Malnourished, thin built  HEENT:PERRL,Oral mucosa moist, Ear/Nose normal on gross exam Respiratory system: Bilateral equal air  entry, normal vesicular breath sounds, no wheezes or crackles  Cardiovascular system: S1 & S2 heard, RRR. No JVD, murmurs, rubs, gallops or clicks. No pedal edema. Gastrointestinal system: Abdomen is nondistended, soft and nontender. No organomegaly or masses felt. Normal bowel sounds heard. Central nervous system: Alert and oriented. No focal neurological deficits. Extremities: No edema, no clubbing ,no cyanosis, distal peripheral pulses palpable. Skin: No rashes, lesions or ulcers,no  icterus ,no pallor    Data Reviewed: I have personally reviewed following labs and imaging studies  CBC: Recent Labs  Lab 05/29/19 1326 06/02/19 0006 06/02/19 0519  WBC 2.2* 2.9* 2.2*  NEUTROABS  --  1.8  --   HGB 13.6 16.9 12.7*  HCT 41.5 51.7 38.2*  MCV 95.0 94.0 95.0  PLT 246 274 205   Basic Metabolic Panel: Recent Labs  Lab 05/29/19 1326 06/02/19 0006 06/02/19 0519 06/03/19 0554  NA 139 135  --  138  K 4.3 4.5  --  3.9  CL 103 100  --  114*  CO2 28 19*  --  17*  GLUCOSE 79 89  --  89  BUN 7 39*  --  11  CREATININE 0.85 4.28* 2.82* 0.85  CALCIUM 9.1 9.4  --  7.6*   GFR: Estimated Creatinine Clearance: 85.3 mL/min (by C-G formula based on SCr of 0.85 mg/dL). Liver Function Tests: Recent Labs  Lab 05/29/19 1326 06/02/19 0006  AST 40 60*  ALT 20 32  ALKPHOS 116 132*  BILITOT 0.4 0.3  PROT 8.3* 9.9*  ALBUMIN 3.7 4.4   Recent Labs  Lab 05/29/19 1326 06/02/19 0006  LIPASE 34 46   No results for input(s): AMMONIA in the last 168 hours. Coagulation Profile: No results for input(s): INR, PROTIME in the last 168 hours. Cardiac Enzymes: No results for input(s): CKTOTAL, CKMB, CKMBINDEX, TROPONINI in the last 168 hours. BNP (last 3 results) No results for input(s): PROBNP in the last 8760 hours. HbA1C: No results for input(s): HGBA1C in the last 72 hours. CBG: No results for input(s): GLUCAP in the last 168 hours. Lipid Profile: No results for input(s): CHOL, HDL, LDLCALC, TRIG, CHOLHDL, LDLDIRECT in the last 72 hours. Thyroid Function Tests: No results for input(s): TSH, T4TOTAL, FREET4, T3FREE, THYROIDAB in the last 72 hours. Anemia Panel: No results for input(s): VITAMINB12, FOLATE, FERRITIN, TIBC, IRON, RETICCTPCT in the last 72 hours. Sepsis Labs: Recent Labs  Lab 06/02/19 0043  LATICACIDVEN 1.0    Recent Results (from the past 240 hour(s))  Novel Coronavirus, NAA (Hosp order, Send-out to Ref Lab; TAT 18-24 hrs     Status: None   Collection  Time: 06/02/19 12:06 AM   Specimen: Nasopharyngeal Swab; Respiratory  Result Value Ref Range Status   SARS-CoV-2, NAA NOT DETECTED NOT DETECTED Final    Comment: (NOTE) This nucleic acid amplification test was developed and its performance characteristics determined by [employer]. Nucleic acid amplification tests include PCR and TMA. This test has not been FDA cleared or approved. This test has been authorized by FDA under an Emergency Use Authorization (EUA). This test is only authorized for the duration of time the declaration that circumstances exist justifying the authorization of the emergency use of in vitro diagnostic tests for detection of SARS-CoV-2 virus and/or diagnosis of COVID-19 infection under section 564(b)(1) of the Act, 21 U.S.C. 810FBP-1(W) (1), unless the authorization is terminated or revoked sooner. When diagnostic testing is negative, the possibility of a false negative result should be considered in the context of  a patient's recent exposures and the presence of clinical signs and symptoms consistent with COVID-19. An individual without symptoms of COVID- 19 and who is not shedding SARS-CoV-2 vi rus would expect to have a negative (not detected) result in this assay. Performed At: [facility] [address] [place], [place] [id number] Jolene SchimkeNagendra Sanjai MD XB:[phone]    Coronavirus Source NASOPHARYNGEAL  Final    Comment: Performed at [facility], [address] [place], [place] [address]  Blood culture (routine x 2)     Status: None (Preliminary result)   Collection Time: 06/02/19 12:43 AM   Specimen: BLOOD  Result Value Ref Range Status   Specimen Description   Final    BLOOD RIGHT ANTECUBITAL Performed at [facility], [address] [place], [place] [address]    Special Requests   Final    BOTTLES DRAWN AEROBIC AND ANAEROBIC Blood Culture adequate volume Performed at [facility], [address] [place], [place] [address]    Culture   Final    NO GROWTH 1 DAY Performed at [facility], [address] [place], [place] [address]    Report Status PENDING  Incomplete  C difficile quick scan w PCR reflex     Status: None   Collection Time: 06/02/19  2:08 AM   Specimen: Urine, Clean Catch; Stool  Result Value Ref Range Status   C Diff antigen NEGATIVE NEGATIVE Final   C Diff toxin NEGATIVE NEGATIVE Final   C Diff interpretation No C. difficile detected.  Final    Comment: Performed at [facility], [address] [place], [place] [address]  Blood culture (routine x 2)     Status: None (Preliminary result)   Collection Time: 06/02/19  6:15 PM   Specimen: BLOOD  Result Value Ref Range Status   Specimen Description   Final    BLOOD RIGHT ARM Performed at [facility], [address] [place], [place] [address]    Special Requests   Final    BOTTLES DRAWN AEROBIC AND ANAEROBIC Blood Culture adequate volume Performed at [facility], [address] [place], [place] [address]    Culture   Final    NO GROWTH < 12 HOURS Performed at [facility], [address] [place], [place] [address]    Report Status PENDING  Incomplete         Radiology Studies: DG Chest Portable 1 View  Result Date: 06/02/2019 CLINICAL DATA:  Shortness of breath. EXAM: PORTABLE CHEST 1 VIEW COMPARISON:  05/29/2019 FINDINGS: The heart size and mediastinal contours are within normal limits. Both lungs are clear. The visualized skeletal structures are unremarkable. There are old healed left-sided rib fractures. IMPRESSION: No active disease. Electronically Signed   By: Katherine Mantlehristopher  Green M.D.   On: 06/02/2019 00:10        Scheduled Meds: . heparin  5,000 Units Subcutaneous Q8H  . hydrocortisone   Rectal TID  . mirtazapine  7.5 mg Oral QHS  . QUEtiapine  200 mg Oral QHS   Continuous Infusions:    LOS: 1 day    Time spent:25 mins. More than 50% of that time was spent in counseling and/or coordination of care.      Burnadette PopAmrit Jawann Urbani, MD [facility] Pager [phone]  If 7PM-7AM, please contact night-coverage [email or web address] Password [email or web address] 06/03/2019, 1:26 PM

## 2019-06-03 NOTE — Discharge Instructions (Signed)
Nutrition Post Hospital Stay Proper nutrition can help your body recover from illness and injury.   Foods and beverages high in protein, vitamins, and minerals help rebuild muscle loss, promote healing, & reduce fall risk.   .In addition to eating healthy foods, a nutrition shake is an easy, delicious way to get the nutrition you need during and after your hospital stay  It is recommended that you continue to drink 2 bottles per day of:       Ensure/Boost for at least 1 month (30 days) after your hospital stay   Tips for adding a nutrition shake into your routine: As allowed, drink one with vitamins or medications instead of water or juice Enjoy one as a tasty mid-morning or afternoon snack Drink cold or make a milkshake out of it Drink one instead of milk with cereal or snacks Use as a coffee creamer   Available at the following grocery stores and pharmacies:           * [employer] * [employer] * [employer]co  * Rite Aid          * Walmart * [employer] Club  * Walgreens      * Target  * BJ's   * CVS  * [employer]   * [facility] [phone]            For COUPONS visit: [email or web address] or [email or web address]   Suggested Substitutions Ensure Plus = Boost Plus = Carnation Breakfast Essentials = Boost Compact Ensure Active Clear = Boost Breeze Glucerna Shake = Boost Glucose Control = Carnation Breakfast Essentials SUGAR FREE    

## 2019-06-03 NOTE — TOC Initial Note (Signed)
Transition of Care [facility]) - Initial/Assessment Note    Patient Details  Name: Kurt Foley MRN: [id number] Date of Birth: 10-22-1975  [facility]) CM/SW Contact:    Trish Mage, LCSW Phone Number: 06/03/2019, 4:07 PM  Clinical Narrative:   Kurt Foley was seen due to high risk of readmission.  He states he has been staying with various family members, sometimes cousins, most recently his grandmother, and he states he will be returning there at discharge.  He has no income, has not worked recently, and asked for information on how to apply for disability.  Information given.. Kurt Foley states he does not have a PCP, has not been following up at [facility], and has not been following up at [facility].  He knows he can go to the walk-in clinic at [facility] at any time to re-engage there.  He gave me permission to set up appointments with PCP and [facility]. No further needs identified.  TOC will continue to follow during the course of hospitalization.                Expected Discharge Plan: Home/Self Care Barriers to Discharge: No Barriers Identified   Patient Goals and CMS Choice        Expected Discharge Plan and Services Expected Discharge Plan: Home/Self Care   Discharge Planning Services: CM Consult   Living arrangements for the past 2 months: Single Family Home                                      Prior Living Arrangements/Services Living arrangements for the past 2 months: Single Family Home Lives with:: Relatives Patient language and need for interpreter reviewed:: Yes Do you feel safe going back to the place where you live?: Yes      Need for Family Participation in Patient Care: No (Comment) Care giver support system in place?: Yes (comment)   Criminal Activity/Legal Involvement Pertinent to Current Situation/Hospitalization: No - Comment as needed  Activities of Daily Living Home Assistive Devices/Equipment: None ADL Screening (condition at time of  admission) Patient's cognitive ability adequate to safely complete daily activities?: Yes Is the patient deaf or have difficulty hearing?: No Does the patient have difficulty seeing, even when wearing glasses/contacts?: No Does the patient have difficulty concentrating, remembering, or making decisions?: No Patient able to express need for assistance with ADLs?: Yes Does the patient have difficulty dressing or bathing?: No Independently performs ADLs?: Yes (appropriate for developmental age)(patient having weakness and fatigue) Does the patient have difficulty walking or climbing stairs?: Yes(secondary to weakness) Weakness of Legs: Both Weakness of Arms/Hands: None  Permission Sought/Granted                  Emotional Assessment Appearance:: Appears stated age Attitude/Demeanor/Rapport: Engaged Affect (typically observed): Appropriate Orientation: : Oriented to Self, Oriented to Place, Oriented to  Time, Oriented to Situation Alcohol / Substance Use: Illicit Drugs(Going back to 2016 has multiple substance abuse diagnoses in our system) Psych Involvement: Yes (comment)(Goin back to 2016 has diagnosis of MDD, severe, recurrent, w/o psychosis in our system)  Admission diagnosis:  Dehydration [E86.0] HIV disease ([place]) [B20] Generalized weakness [R53.1] AKI (acute kidney injury) ([place]) [N17.9] Nausea vomiting and diarrhea [R11.2, R19.7] Patient Active Problem List   Diagnosis Date Noted  . Dehydration 06/02/2019  . AKI (acute kidney injury) ([place]) 06/02/2019  . Nausea and vomiting in adult 06/02/2019  .  Generalized weakness 06/02/2019  . Nausea vomiting and diarrhea 06/02/2019  . Cocaine abuse ([facility]) 11/25/2018  . 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 Substance induced mood disorder ([facility]) 09/16/2015  . Cocaine dependence with cocaine-induced mood disorder (H[facility])   . Cannabis use disorder, severe, dependence (HCC) 04/09/2015  .  Severe recurrent major depression without psychotic features ([facility]) 04/08/2015  . Alcohol abuse 04/08/2015  . Screening examination for venereal disease 03/25/2015  . Encounter for long-term (current) use of medications 03/25/2015  . Tobacco abuse   . Visual disturbance 11/18/2013  . Human immunodeficiency virus (HIV) disease ([facility]) 10/30/2013  . Atopic dermatitis 10/30/2013   PCP:  [name], MD Pharmacy:   [facility] [phone] [place], [place] - [address] AT [facility] OF [address] [place]  [address] Phone: [phone] Fax: [phone]  [facility] [phone] - [place], [place] - [phone] [facility] [address] AT [facility] OF [address] & [address] [place] [address] Phone: [phone] Fax: [phone]     Social Determinants of Health (SDOH) Interventions    Readmission Risk Interventions No flowsheet data found.

## 2019-06-04 ENCOUNTER — Inpatient Hospital Stay (HOSPITAL_COMMUNITY): Payer: Self-pay

## 2019-06-04 LAB — BASIC METABOLIC PANEL
Anion gap: 5 (ref 5–15)
BUN: 8 mg/dL (ref 6–20)
CO2: 20 mmol/L — ABNORMAL LOW (ref 22–32)
Calcium: 8 mg/dL — ABNORMAL LOW (ref 8.9–10.3)
Chloride: 111 mmol/L (ref 98–111)
Creatinine, Ser: 0.68 mg/dL (ref 0.61–1.24)
GFR calc Af Amer: >60 mL/min (ref >60)
GFR calc non Af Amer: >60 mL/min (ref >60)
Glucose, Bld: 94 mg/dL (ref 70–99)
Potassium: 3.4 mmol/L — ABNORMAL LOW (ref 3.5–5.1)
Sodium: 136 mmol/L (ref 135–145)

## 2019-06-04 MED ORDER — LOPERAMIDE HCL 2 MG PO CAPS: 2.0000 mg | ORAL_CAPSULE | Freq: Three times a day (TID) | ORAL | 0 refills | Status: DC | PRN

## 2019-06-04 MED ORDER — GADOBUTROL 1 MMOL/ML IV SOLN: 7.0000 mL | Freq: Once | INTRAVENOUS | Status: DC | PRN

## 2019-06-04 MED ORDER — HYDROCORTISONE (PERIANAL) 2.5 % EX CREA: TOPICAL_CREAM | Freq: Three times a day (TID) | CUTANEOUS | 0 refills | Status: DC

## 2019-06-04 MED ORDER — POTASSIUM CHLORIDE CRYS ER 20 MEQ PO TBCR
40.0000 meq | EXTENDED_RELEASE_TABLET | Freq: Once | ORAL | Status: DC
  Filled 2019-06-04: qty 2

## 2019-06-04 MED ORDER — MIRTAZAPINE 7.5 MG PO TABS: 7.5000 mg | ORAL_TABLET | Freq: Every day | ORAL | 1 refills | Status: DC

## 2019-06-04 MED ORDER — QUETIAPINE FUMARATE 200 MG PO TABS: 200.0000 mg | ORAL_TABLET | Freq: Every day | ORAL | 1 refills | Status: DC

## 2019-06-04 MED ORDER — POTASSIUM CHLORIDE CRYS ER 20 MEQ PO TBCR: 20.0000 meq | EXTENDED_RELEASE_TABLET | Freq: Every day | ORAL | 0 refills | Status: DC

## 2019-06-04 NOTE — Discharge Summary (Signed)
Physician Discharge Summary  Kurt Foley ZOX:[id number] DOB: 12-05-1975 DOA: 06/01/2019  PCP: Kurt Barefoot, MD  Admit date: 06/01/2019 Discharge date: 06/04/2019  Admitted From: Home Disposition:  Home  Discharge Condition:Stable CODE STATUS:FULL Diet recommendation:  Regular   Brief/Interim Summary:  Patient is a 43 year old male with history of HIV, depression, noncompliance,substance abuse, major depressive disorder with recurrent who presents to the emergency department complains of generalized weakness, fatigue, several episodes of nausea, vomiting, loose stools started 4 days ago. He was unable to eat or drink. Patient states he is not taking his HIVmedication currently. He wasfollowingwith Dr. [name] currently has not seen him for last 2 years because of his depression.He was recently admitted and [facility] after he was managed for major depressive disorder. On presentation he was found to be hypotensive, tachycardic, severe AKI. Patient was started on IV fluids. GI and C. difficile panel sent. C diff negative.GI pathogen panel  Never came back.. Covid-19 test negative. AKI resolved with IV fluids.  Patient's diarrhoea, abdomen pain, nausea and vomiting have improved.  He is hemodynamically stable for discharge today.  He should follow-up with Dr. Rosetta Foley, infectious disease, as soon as possible.  Following problems were addressed during his hospitalization:  Nausea, vomiting, diarrhea, abdominal pain: Due to unknown etiology.  These symptoms have improved.  C. difficile negative.  GI pathogen panel never came back.  Since patient's diarrhea improved and CT abdomen did not show any colitis, will discontinue antibiotics.  HIV disease: Noncompliant.  Currently not taking his antiretrovirals.  CD4 less than 35.  He was following with Dr. Rosetta Foley but he has not followed up for last 2 years.  I have requested for  compliance and follow-up with infectious disease as soon as possible.  I have requested social worker for arrangement for follow-up.  Abnormal abdominal CT: CT abdomen did not show any colitis.  Showed mildly dilated pancreatic duct.  MRI showed onspecific mild dilatation of the main pancreatic duct within the pancreatic head. No focal pancreatic abnormality or surrounding inflammation identified. CT also showed possible left sided pneumonia.  Patient is completely asymptomatic, no signs of pneumonia, breathing fine, no cough or fever.  No indication of antibiotic treatment.  Leukopenia: Most likely secondary to HIV.  Continue to monitor  AKI: Resolved with IV fluids  Depression: On Remeron and Seroquel.  He was [facility] for severe major depression.  Discharge Diagnoses:  Principal Problem:   Nausea vomiting and diarrhea Active Problems:   Human immunodeficiency virus (HIV) disease (HCC)   AKI (acute kidney injury) (HCC)   Nausea and vomiting in adult   Generalized weakness    Discharge Instructions  Discharge Instructions    Diet - low sodium heart healthy   Complete by: As directed    Discharge instructions   Complete by: As directed    1)Please take prescribed medication as instructed. 2)Follow up with infectious disease as soon as possible. 3)Follow up with your PCP in a week.   Increase activity slowly   Complete by: As directed      Allergies as of 06/04/2019   No Known Allergies     Medication List    STOP taking these medications   clotrimazole 1 % cream Commonly known as: LOTRIMIN   famotidine 40 MG tablet Commonly known as: PEPCID   fluconazole 200 MG tablet Commonly known as: DIFLUCAN   lactobacillus acidophilus & bulgar chewable tablet  TAKE these medications   abacavir-dolutegravir-lamiVUDine 600-50-300 MG tablet Commonly known as: Triumeq Take 1 tablet by mouth daily.   darunavir-cobicistat 800-150  MG tablet Commonly known as: Prezcobix Take 1 tablet by mouth daily with breakfast.   hydrocortisone 2.5 % rectal cream Commonly known as: ANUSOL-HC Place rectally 3 (three) times daily.   loperamide 2 MG capsule Commonly known as: IMODIUM Take 1 capsule (2 mg total) by mouth every 8 (eight) hours as needed for diarrhea or loose stools.   mirtazapine 7.5 MG tablet Commonly known as: REMERON Take 1 tablet (7.5 mg total) by mouth at bedtime.   potassium chloride SA 20 MEQ tablet Commonly known as: KLOR-CON Take 1 tablet (20 mEq total) by mouth daily for 5 doses. Start taking on: June 05, 2019   QUEtiapine 200 MG tablet Commonly known as: SEROQUEL Take 1 tablet (200 mg total) by mouth at bedtime.      Follow-up Information    [facility] Follow up on 07/11/2019.   Specialty: Internal Medicine Why: Friday at 1PM for your hospital follow up appointment Contact information: [address] [place] [address] [phone]       [facility] Follow up.   Specialty: Infectious Diseases Contact information: [address] [id number] mc [address] [place] [address] [phone]         No Known Allergies  Consultations:  None   Procedures/Studies: CT ABDOMEN PELVIS W CONTRAST  Result Date: 06/03/2019 CLINICAL DATA:  Epigastric abdominal pain EXAM: CT ABDOMEN AND PELVIS WITH CONTRAST TECHNIQUE: Multidetector CT imaging of the abdomen and pelvis was performed using the standard protocol following bolus administration of intravenous contrast. CONTRAST:  OMNIPAQUE IOHEXOL 300 MG/ML  SOLN COMPARISON:  Abdominal ultrasound 05/29/2019, chest radiograph 06/02/2019 FINDINGS: Lower chest: Centrilobular and paraseptal emphysema is noted in the lung bases with a few large air cyst in the right and left lower lobes. There is a focal region of consolidation the anterior basal segment of the left lobe with  associated air bronchograms. Normal heart size. No pericardial effusion. Atherosclerotic calcification of the coronary arteries. Hepatobiliary: Lobular hypoattenuating 1.4 cm lesion in the posterior right lobe liver (2/19) with some nodular discontinuous peripheral enhancement with centripetal filling on delayed imaging. Most compatible with hemangioma though incompletely characterized on this CT study. Similar hypoattenuating lesion in the more anterosuperior right lobe liver measuring 8 mm (2/11), is too small to fully characterize on CT imaging but statistically likely benign. Additional geographic hypoattenuation along the falciform ligament may reflect focal fatty infiltration. No other liver lesions. Gallbladder largely decompressed. No frank wall thickening, pericholecystic inflammation, visible calcified gallstones, or biliary ductal dilatation. Pancreas: Borderline dilatation of the main pancreatic duct at the level of pancreatic head. No peripancreatic inflammation or fluid is seen. No visible pancreatic lesions are evident. Spleen: Normal in size without focal abnormality. Adrenals/Urinary Tract: Adrenal glands are unremarkable. Kidneys are normal, without renal calculi, focal lesion, or hydronephrosis. Bladder is partially decompressed at the time of exam with mild wall thickening which may be related to underdistention. Stomach/Bowel: Distal esophagus, stomach and duodenal sweep are unremarkable. No small bowel wall thickening or dilatation. No evidence of obstruction. A normal appendix is visualized. No colonic dilatation or wall thickening. Vascular/Lymphatic: Atherosclerotic plaque within the normal caliber aorta. Few prominent, though nonenlarged mesenteric nodes seen centrally. No pathologically enlarged nodes are present in the abdomen pelvis. Reproductive: The prostate and seminal vesicles are unremarkable. Other: No free fluid or  free air. Paucity of intraperitoneal and subcutaneous fat.  Musculoskeletal: Multilevel degenerative changes are present in the imaged portions of the spine. No acute osseous abnormality or suspicious osseous lesion. Transitional lumbosacral anatomy with sacralization of the L5 level. IMPRESSION: 1. Focal region of consolidation in the anterior basal segment of the left lobe with associated air bronchograms, concerning for pneumonia. 2. Borderline dilatation of the main pancreatic duct at the level of pancreatic head. No visible pancreatic lesions. Recommend further evaluation with MRI/MRCP with and without contrast. 3. Emphysema and aortic atherosclerosis. 4. Right lobe liver lesions, incompletely characterized on this CT study, but with an appearance favoring hemangiomata. 5. Transitional lumbosacral anatomy with sacralization of the L5 level. 6. Mild bladder wall thickening which may be related to underdistention. Consider correlation with urinalysis to exclude cystitis if symptoms are present. 7. Aortic Atherosclerosis (ICD10-I70.0) 8. Emphysema (ICD10-J43.9). Electronically Signed   By: Kreg ShropshirePrice  DeHay M.D.   On: 06/03/2019 19:33   MR ABDOMEN MRCP WO CONTRAST  Result Date: 06/04/2019 CLINICAL DATA:  Abdominal pain with nausea, vomiting and diarrhea for 4 days. Limited oral intake. History of substance abuse and HIV infection. EXAM: MRI ABDOMEN WITHOUT CONTRAST  (INCLUDING MRCP) TECHNIQUE: Multiplanar multisequence MR imaging of the abdomen was performed. Heavily T2-weighted images of the biliary and pancreatic ducts were obtained, and three-dimensional MRCP images were rendered by post processing. COMPARISON:  CT 06/03/2019. FINDINGS: Despite efforts by the technologist and patient, mild motion artifact is present on today's exam and could not be eliminated. This reduces exam sensitivity and specificity. The patient was not able to complete the study. Only 7 sequences were obtained. No contrast was administered. Lower chest: Predominately linear airspace disease at  the left lung base is grossly stable. No significant pleural effusion. Hepatobiliary: There are at least 3 T2 hyperintense lesions within the right hepatic lobe, measuring up to 12 mm on image 19/3. These appear to be enhancing on previous CT and are most consistent with hemangiomas. The gallbladder appears normal. No significant biliary dilatation. The common hepatic duct has a maximal diameter of 7 mm. The common bile duct is normal in caliber. No evidence of choledocholithiasis. Pancreas: The main pancreatic duct is mildly dilated to 5-6 mm within the pancreatic head. No focal pancreatic abnormality or surrounding inflammatory changes are identified on this motion limited examination. Spleen: Normal in size without focal abnormality. Adrenals/Urinary Tract: Both adrenal glands appear normal. Both kidneys appear normal without hydronephrosis. Stomach/Bowel: No evidence of bowel wall thickening, distention or surrounding inflammatory change. Vascular/Lymphatic: There are no enlarged abdominal lymph nodes. No significant vascular findings. Other: No significant ascites. Musculoskeletal: No acute or significant osseous findings. IMPRESSION: 1. No evidence of biliary dilatation or choledocholithiasis. 2. Nonspecific mild dilatation of the main pancreatic duct within the pancreatic head. No focal pancreatic abnormality or surrounding inflammation identified on this motion limited examination. This could be secondary to previous pancreatitis. 3. Several small hepatic hemangiomas. 4. Grossly stable linear airspace disease at the left lung base. Electronically Signed   By: Carey BullocksWilliam  Veazey M.D.   On: 06/04/2019 15:15   MR 3D Recon At Scanner  Result Date: 06/04/2019 CLINICAL DATA:  Abdominal pain with nausea, vomiting and diarrhea for 4 days. Limited oral intake. History of substance abuse and HIV infection. EXAM: MRI ABDOMEN WITHOUT CONTRAST  (INCLUDING MRCP) TECHNIQUE: Multiplanar multisequence MR imaging of the  abdomen was performed. Heavily T2-weighted images of the biliary and pancreatic ducts were obtained, and three-dimensional MRCP images were rendered by post processing.  COMPARISON:  CT 06/03/2019. FINDINGS: Despite efforts by the technologist and patient, mild motion artifact is present on today's exam and could not be eliminated. This reduces exam sensitivity and specificity. The patient was not able to complete the study. Only 7 sequences were obtained. No contrast was administered. Lower chest: Predominately linear airspace disease at the left lung base is grossly stable. No significant pleural effusion. Hepatobiliary: There are at least 3 T2 hyperintense lesions within the right hepatic lobe, measuring up to 12 mm on image 19/3. These appear to be enhancing on previous CT and are most consistent with hemangiomas. The gallbladder appears normal. No significant biliary dilatation. The common hepatic duct has a maximal diameter of 7 mm. The common bile duct is normal in caliber. No evidence of choledocholithiasis. Pancreas: The main pancreatic duct is mildly dilated to 5-6 mm within the pancreatic head. No focal pancreatic abnormality or surrounding inflammatory changes are identified on this motion limited examination. Spleen: Normal in size without focal abnormality. Adrenals/Urinary Tract: Both adrenal glands appear normal. Both kidneys appear normal without hydronephrosis. Stomach/Bowel: No evidence of bowel wall thickening, distention or surrounding inflammatory change. Vascular/Lymphatic: There are no enlarged abdominal lymph nodes. No significant vascular findings. Other: No significant ascites. Musculoskeletal: No acute or significant osseous findings. IMPRESSION: 1. No evidence of biliary dilatation or choledocholithiasis. 2. Nonspecific mild dilatation of the main pancreatic duct within the pancreatic head. No focal pancreatic abnormality or surrounding inflammation identified on this motion limited  examination. This could be secondary to previous pancreatitis. 3. Several small hepatic hemangiomas. 4. Grossly stable linear airspace disease at the left lung base. Electronically Signed   By: Carey Bullocks M.D.   On: 06/04/2019 15:15   US Abdomen Limited  Result Date: 05/29/2019 CLINICAL DATA:  Initial evaluation for acute epigastric pain. EXAM: ULTRASOUND ABDOMEN LIMITED RIGHT UPPER QUADRANT COMPARISON:  None. FINDINGS: Gallbladder: No gallstones or wall thickening visualized. No sonographic Murphy sign noted by sonographer. Common bile duct: Diameter: 3.7 mm Liver: Well-circumscribed round echogenic lesion measuring 1.5 x 1.3 x 1.6 cm noted within the right hepatic lobe, most consistent with a small benign hemangioma. Within normal limits in parenchymal echogenicity. Portal vein is patent on color Doppler imaging with normal direction of blood flow towards the liver. Other: None. IMPRESSION: 1. Normal sonographic evaluation of the gallbladder. No biliary dilatation. 2. 1.6 cm echogenic lesion within the right hepatic lobe, most characteristic of a small benign hemangioma. Electronically Signed   By: Rise Mu M.D.   On: 05/29/2019 19:23   DG Chest Portable 1 View  Result Date: 06/02/2019 CLINICAL DATA:  Shortness of breath. EXAM: PORTABLE CHEST 1 VIEW COMPARISON:  05/29/2019 FINDINGS: The heart size and mediastinal contours are within normal limits. Both lungs are clear. The visualized skeletal structures are unremarkable. There are old healed left-sided rib fractures. IMPRESSION: No active disease. Electronically Signed   By: Katherine Mantle M.D.   On: 06/02/2019 00:10   DG Chest Port 1 View  Result Date: 05/29/2019 CLINICAL DATA:  Weakness, vomiting EXAM: PORTABLE CHEST 1 VIEW COMPARISON:  06/13/2017 FINDINGS: Heart and mediastinal contours are within normal limits. No focal opacities or effusions. No acute bony abnormality. Old healed left rib fractures. IMPRESSION: No active  cardiopulmonary disease. Electronically Signed   By: Charlett Nose M.D.   On: 05/29/2019 18:21      Subjective: Patient seen and examined the bedside this morning.  Hemodynamically stable for discharge today.  Discharge Exam: Vitals:   06/04/19 9563 06/04/19  1404  BP: 97/73 110/72  Pulse: 92 83  Resp: 17 17  Temp: 98.9 F (37.2 C) 98.1 F (36.7 C)  SpO2: 99% 99%   Vitals:   06/03/19 1300 06/03/19 2207 06/04/19 0613 06/04/19 1404  BP: (!) 131/95 105/84 97/73 110/72  Pulse: (!) 101 (!) 101 92 83  Resp:  Temp: 99.9 F (37.7 C) 99.5 F (37.5 C) 98.9 F (37.2 C) 98.1 F (36.7 C)  TempSrc: Oral Oral Oral Oral  SpO2: 99% 100% 99% 99%  Weight:      Height:        General: Pt is alert, awake, not in acute distress Cardiovascular: RRR, S1/S2 +, no rubs, no gallops Respiratory: CTA bilaterally, no wheezing, no rhonchi Abdominal: Soft, NT, ND, bowel sounds + Extremities: no edema, no cyanosis    The results of significant diagnostics from this hospitalization (including imaging, microbiology, ancillary and laboratory) are listed below for reference.     Microbiology: Recent Results (from the past 240 hour(s))  Novel Coronavirus, NAA (Hosp order, Send-out to Ref Lab; TAT 18-24 hrs     Status: None   Collection Time: 06/02/19 12:06 AM   Specimen: Nasopharyngeal Swab; Respiratory  Result Value Ref Range Status   SARS-CoV-2, NAA NOT DETECTED NOT DETECTED Final    Comment: (NOTE) This nucleic acid amplification test was developed and its performance characteristics determined by [employer]. Nucleic acid amplification tests include PCR and TMA. This test has not been FDA cleared or approved. This test has been authorized by FDA under an Emergency Use Authorization (EUA). This test is only authorized for the duration of time the declaration that circumstances exist justifying the authorization of the emergency use of in vitro diagnostic tests for detection  of SARS-CoV-2 virus and/or diagnosis of COVID-19 infection under section 564(b)(1) of the Act, 21 U.S.C. 161WRU-0(A) (1), unless the authorization is terminated or revoked sooner. When diagnostic testing is negative, the possibility of a false negative result should be considered in the context of a patient's recent exposures and the presence of clinical signs and symptoms consistent with COVID-19. An individual without symptoms of COVID- 19 and who is not shedding SARS-CoV-2 vi rus would expect to have a negative (not detected) result in this assay. Performed At: [facility] [address] [place], [place] [id number] Jolene Schimke MD YN:[id number]    Coronavirus Source NASOPHARYNGEAL  Final    Comment: Performed at [facility], [address] [place], [place] [address]  Blood culture (routine x 2)     Status: None (Preliminary result)   Collection Time: 06/02/19 12:43 AM   Specimen: BLOOD  Result Value Ref Range Status   Specimen Description   Final    BLOOD RIGHT ANTECUBITAL Performed at [facility], [address] [place], [place] [address]    Special Requests   Final    BOTTLES DRAWN AEROBIC AND ANAEROBIC Blood Culture adequate volume Performed at [facility], [address], [place], [place] [address]    Culture   Final    NO GROWTH 2 DAYS Performed at [facility], [address], [place], [place] [address]    Report Status PENDING  Incomplete  C difficile quick scan w PCR reflex     Status: None   Collection Time: 06/02/19  2:08 AM   Specimen: Urine, Clean Catch; Stool  Result Value Ref Range Status   C Diff antigen NEGATIVE NEGATIVE Final  C Diff toxin NEGATIVE NEGATIVE Final   C Diff interpretation No C. difficile detected.  Final    Comment: Performed at [facility], [address], [place], [place] [address]  Blood culture (routine x 2)     Status: None (Preliminary  result)   Collection Time: 06/02/19  6:15 PM   Specimen: BLOOD  Result Value Ref Range Status   Specimen Description   Final    BLOOD RIGHT ARM Performed at [facility], [address] [place], [place] [address]    Special Requests   Final    BOTTLES DRAWN AEROBIC AND ANAEROBIC Blood Culture adequate volume Performed at [facility], [address] [place], [place] [address]    Culture   Final    NO GROWTH 2 DAYS Performed at [facility], [address] [place], [place] [address]    Report Status PENDING  Incomplete     Labs: BNP (last 3 results) No results for input(s): BNP in the last 8760 hours. Basic Metabolic Panel: Recent Labs  Lab 05/29/19 1326 06/02/19 0006 06/02/19 0519 06/03/19 0554 06/04/19 0545  NA 139 135  --  138 136  K 4.3 4.5  --  3.9 3.4*  CL 103 100  --  114* 111  CO2 28 19*  --  17* 20*  GLUCOSE 79 89  --  89 94  BUN 7 39*  --  11 8  CREATININE 0.85 4.28* 2.82* 0.85 0.68  CALCIUM 9.1 9.4  --  7.6* 8.0*   Liver Function Tests: Recent Labs  Lab 05/29/19 1326 06/02/19 0006  AST 40 60*  ALT 20 32  ALKPHOS 116 132*  BILITOT 0.4 0.3  PROT 8.3* 9.9*  ALBUMIN 3.7 4.4   Recent Labs  Lab 05/29/19 1326 06/02/19 0006  LIPASE 34 46   No results for input(s): AMMONIA in the last 168 hours. CBC: Recent Labs  Lab 05/29/19 1326 06/02/19 0006 06/02/19 0519  WBC 2.2* 2.9* 2.2*  NEUTROABS  --  1.8  --   HGB 13.6 16.9 12.7*  HCT 41.5 51.7 38.2*  MCV 95.0 94.0 95.0  PLT 246 274 205   Cardiac Enzymes: No results for input(s): CKTOTAL, CKMB, CKMBINDEX, TROPONINI in the last 168 hours. BNP: Invalid input(s): POCBNP CBG: No results for input(s): GLUCAP in the last 168 hours. D-Dimer No results for input(s): DDIMER in the last 72 hours. Hgb A1c No results for input(s): HGBA1C in the last 72 hours. Lipid Profile No results for input(s): CHOL, HDL, LDLCALC, TRIG, CHOLHDL, LDLDIRECT in the last 72  hours. Thyroid function studies No results for input(s): TSH, T4TOTAL, T3FREE, THYROIDAB in the last 72 hours.  Invalid input(s): FREET3 Anemia work up No results for input(s): VITAMINB12, FOLATE, FERRITIN, TIBC, IRON, RETICCTPCT in the last 72 hours. Urinalysis    Component Value Date/Time   COLORURINE AMBER (A) 06/02/2019 0006   APPEARANCEUR CLOUDY (A) 06/02/2019 0006   LABSPEC 1.023 06/02/2019 0006   PHURINE 5.0 06/02/2019 0006   GLUCOSEU NEGATIVE 06/02/2019 0006   HGBUR NEGATIVE 06/02/2019 0006   BILIRUBINUR NEGATIVE 06/02/2019 0006   KETONESUR 5 (A) 06/02/2019 0006   PROTEINUR 30 (A) 06/02/2019 0006   UROBILINOGEN 1.0 03/20/2015 0000   NITRITE NEGATIVE 06/02/2019 0006   LEUKOCYTESUR NEGATIVE 06/02/2019 0006   Sepsis Labs Invalid input(s): PROCALCITONIN,  WBC,  LACTICIDVEN Microbiology Recent Results (from the past 240 hour(s))  Novel Coronavirus, NAA (Hosp order, Send-out to [employer]; TAT 18-24  hrs     Status: None   Collection Time: 06/02/19 12:06 AM   Specimen: Nasopharyngeal Swab; Respiratory  Result Value Ref Range Status   SARS-CoV-2, NAA NOT DETECTED NOT DETECTED Final    Comment: (NOTE) This nucleic acid amplification test was developed and its performance characteristics determined by [employer]. Nucleic acid amplification tests include PCR and TMA. This test has not been FDA cleared or approved. This test has been authorized by FDA under an Emergency Use Authorization (EUA). This test is only authorized for the duration of time the declaration that circumstances exist justifying the authorization of the emergency use of in vitro diagnostic tests for detection of SARS-CoV-2 virus and/or diagnosis of COVID-19 infection under section 564(b)(1) of the Act, 21 U.S.C. 659DJT-7(S) (1), unless the authorization is terminated or revoked sooner. When diagnostic testing is negative, the possibility of a false negative result should be considered in the context  of a patient's recent exposures and the presence of clinical signs and symptoms consistent with COVID-19. An individual without symptoms of COVID- 19 and who is not shedding SARS-CoV-2 vi rus would expect to have a negative (not detected) result in this assay. Performed At: [facility] [address] [place], [place] [id number] Jolene Schimke MD SP:[id number]    Coronavirus Source NASOPHARYNGEAL  Final    Comment: Performed at [facility], [address], [place], [place] [address]  Blood culture (routine x 2)     Status: None (Preliminary result)   Collection Time: 06/02/19 12:43 AM   Specimen: BLOOD  Result Value Ref Range Status   Specimen Description   Final    BLOOD RIGHT ANTECUBITAL Performed at [facility], [address], [place], [place] [address]    Special Requests   Final    BOTTLES DRAWN AEROBIC AND ANAEROBIC Blood Culture adequate volume Performed at [facility], [address] [place], [place] [address]    Culture   Final    NO GROWTH 2 DAYS Performed at [facility], [address], [place], [place] [address]    Report Status PENDING  Incomplete  C difficile quick scan w PCR reflex     Status: None   Collection Time: 06/02/19  2:08 AM   Specimen: Urine, Clean Catch; Stool  Result Value Ref Range Status   C Diff antigen NEGATIVE NEGATIVE Final   C Diff toxin NEGATIVE NEGATIVE Final   C Diff interpretation No C. difficile detected.  Final    Comment: Performed at [facility], [address] [place], [place] [address]  Blood culture (routine x 2)     Status: None (Preliminary result)   Collection Time: 06/02/19  6:15 PM   Specimen: BLOOD  Result Value Ref Range Status   Specimen Description   Final    BLOOD RIGHT ARM Performed at [facility], [address] [place], [place] [address]    Special Requests   Final    BOTTLES DRAWN AEROBIC AND  ANAEROBIC Blood Culture adequate volume Performed at [facility], [address] [place], [place] [address]    Culture   Final    NO GROWTH 2 DAYS Performed at [facility], [address], [place], [place] [address]    Report Status PENDING  Incomplete    Please note: You were cared for by a hospitalist during your hospital stay. Once you are discharged, your primary care physician will handle any  further medical issues. Please note that NO REFILLS for any discharge medications will be authorized once you are discharged, as it is imperative that you return to your primary care physician (or establish a relationship with a primary care physician if you do not have one) for your post hospital discharge needs so that they can reassess your need for medications and monitor your lab values.    Time coordinating discharge: 40 minutes  SIGNED:   Shelly Coss, MD  [facility] 06/04/2019, 3:47 PM Pager [phone]  If 7PM-7AM, please contact night-coverage [email or web address] Password [email or web address]

## 2019-06-06 LAB — GI PATHOGEN PANEL BY PCR, STOOL
Adenovirus F 40/41: NOT DETECTED
Astrovirus: NOT DETECTED
Campylobacter by PCR: NOT DETECTED
Cryptosporidium by PCR: NOT DETECTED
Cyclospora cayetanensis: NOT DETECTED
E coli (ETEC) LT/ST: NOT DETECTED
E coli (STEC): NOT DETECTED
Entamoeba histolytica: NOT DETECTED
Enteroaggregative E coli: DETECTED — AB
Enteropathogenic E coli: NOT DETECTED
G lamblia by PCR: DETECTED — AB
Norovirus GI/GII: NOT DETECTED
Plesiomonas shigelloides: NOT DETECTED
Rotavirus A by PCR: DETECTED — AB
Salmonella by PCR: NOT DETECTED
Sapovirus: NOT DETECTED
Shigella by PCR: NOT DETECTED
Vibrio cholerae: NOT DETECTED
Vibrio: NOT DETECTED
Yersinia enterocolitica: NOT DETECTED

## 2019-06-07 LAB — CULTURE, BLOOD (ROUTINE X 2)
Culture: NO GROWTH
Culture: NO GROWTH
Special Requests: ADEQUATE
Special Requests: ADEQUATE

## 2019-06-10 ENCOUNTER — Encounter: Payer: Self-pay | Admitting: Internal Medicine

## 2019-06-10 NOTE — Progress Notes (Signed)
Patient ID: Kurt Foley, male   DOB: Nov 08, 1975, 43 y.o.   MRN: [id number] Received note from Cloria Spring at [facility] to see if patient can be scheduled   Called left message  Called patient unable to leave a message

## 2019-06-16 ENCOUNTER — Telehealth: Payer: Self-pay

## 2019-06-16 NOTE — Telephone Encounter (Signed)
Patient called office today to schedule appointment with our office. Has been out of care since 2018.  Scheduled appointment for patient to renew ADAP, have labs, and office visit two weeks after.  Patient is okay with appointments at this time. Will call office if he needs to reschedule. Lorenso Courier, [place]

## 2019-07-01 ENCOUNTER — Other Ambulatory Visit: Payer: Self-pay

## 2019-07-01 ENCOUNTER — Ambulatory Visit: Payer: Self-pay

## 2019-07-01 DIAGNOSIS — Z79899 Other long term (current) drug therapy: Secondary | ICD-10-CM

## 2019-07-01 DIAGNOSIS — B20 Human immunodeficiency virus [HIV] disease: Secondary | ICD-10-CM

## 2019-07-01 DIAGNOSIS — Z113 Encounter for screening for infections with a predominantly sexual mode of transmission: Secondary | ICD-10-CM

## 2019-07-02 LAB — URINE CYTOLOGY ANCILLARY ONLY
Chlamydia: NEGATIVE
Comment: NEGATIVE
Comment: NORMAL
Neisseria Gonorrhea: NEGATIVE

## 2019-07-02 LAB — T-HELPER CELL (CD4) - (RCID CLINIC ONLY)
CD4 % Helper T Cell: 2 % — ABNORMAL LOW (ref 33–65)
CD4 T Cell Abs: <35 /uL — ABNORMAL LOW (ref 400–1790)

## 2019-07-05 LAB — COMPREHENSIVE METABOLIC PANEL
AG Ratio: 1 (calc) (ref 1.0–2.5)
ALT: 20 U/L (ref 9–46)
AST: 43 U/L — ABNORMAL HIGH (ref 10–40)
Albumin: 4 g/dL (ref 3.6–5.1)
Alkaline phosphatase (APISO): 127 U/L (ref 36–130)
BUN: 10 mg/dL (ref 7–25)
CO2: 25 mmol/L (ref 20–32)
Calcium: 9.2 mg/dL (ref 8.6–10.3)
Chloride: 103 mmol/L (ref 98–110)
Creat: 1 mg/dL (ref 0.60–1.35)
Globulin: 3.9 g/dL (calc) — ABNORMAL HIGH (ref 1.9–3.7)
Glucose, Bld: 79 mg/dL (ref 65–99)
Potassium: 4.8 mmol/L (ref 3.5–5.3)
Sodium: 137 mmol/L (ref 135–146)
Total Bilirubin: 0.4 mg/dL (ref 0.2–1.2)
Total Protein: 7.9 g/dL (ref 6.1–8.1)

## 2019-07-05 LAB — CBC WITH DIFFERENTIAL/PLATELET
Absolute Monocytes: 294 cells/uL (ref 200–950)
Basophils Absolute: 19 cells/uL (ref 0–200)
Basophils Relative: 0.9 %
Eosinophils Absolute: 59 cells/uL (ref 15–500)
Eosinophils Relative: 2.8 %
HCT: 39.6 % (ref 38.5–50.0)
Hemoglobin: 13.1 g/dL — ABNORMAL LOW (ref 13.2–17.1)
Lymphs Abs: 344 cells/uL — ABNORMAL LOW (ref 850–3900)
MCH: 30.5 pg (ref 27.0–33.0)
MCHC: 33.1 g/dL (ref 32.0–36.0)
MCV: 92.1 fL (ref 80.0–100.0)
MPV: 9.4 fL (ref 7.5–12.5)
Monocytes Relative: 14 %
Neutro Abs: 1384 cells/uL — ABNORMAL LOW (ref 1500–7800)
Neutrophils Relative %: 65.9 %
Platelets: 277 10*3/uL (ref 140–400)
RBC: 4.3 10*6/uL (ref 4.20–5.80)
RDW: 12.4 % (ref 11.0–15.0)
Total Lymphocyte: 16.4 %
WBC: 2.1 10*3/uL — ABNORMAL LOW (ref 3.8–10.8)

## 2019-07-05 LAB — RPR: RPR Ser Ql: NONREACTIVE

## 2019-07-05 LAB — LIPID PANEL
Cholesterol: 158 mg/dL (ref <200)
HDL: 32 mg/dL — ABNORMAL LOW (ref >=40)
LDL Cholesterol (Calc): 101 mg/dL (calc) — ABNORMAL HIGH
Non-HDL Cholesterol (Calc): 126 mg/dL (calc) (ref <130)
Total CHOL/HDL Ratio: 4.9 (calc) (ref <5.0)
Triglycerides: 150 mg/dL — ABNORMAL HIGH (ref <150)

## 2019-07-05 LAB — HIV-1 RNA QUANT-NO REFLEX-BLD
HIV 1 RNA Quant: 116000 copies/mL — ABNORMAL HIGH
HIV-1 RNA Quant, Log: 5.06 Log copies/mL — ABNORMAL HIGH

## 2019-07-11 ENCOUNTER — Ambulatory Visit: Payer: Self-pay | Admitting: Family Medicine

## 2019-07-14 ENCOUNTER — Telehealth: Payer: Self-pay | Admitting: Pharmacy Technician

## 2019-07-14 ENCOUNTER — Other Ambulatory Visit: Payer: Self-pay

## 2019-07-14 ENCOUNTER — Encounter: Payer: Self-pay | Admitting: Internal Medicine

## 2019-07-14 ENCOUNTER — Ambulatory Visit (INDEPENDENT_AMBULATORY_CARE_PROVIDER_SITE_OTHER): Payer: Self-pay | Admitting: Internal Medicine

## 2019-07-14 VITALS — BP 126/89 | HR 103 | Temp 98.7°F | Ht 65.5 in | Wt 111.0 lb

## 2019-07-14 DIAGNOSIS — Z9189 Other specified personal risk factors, not elsewhere classified: Secondary | ICD-10-CM | POA: Insufficient documentation

## 2019-07-14 DIAGNOSIS — Z23 Encounter for immunization: Secondary | ICD-10-CM

## 2019-07-14 DIAGNOSIS — Z113 Encounter for screening for infections with a predominantly sexual mode of transmission: Secondary | ICD-10-CM

## 2019-07-14 DIAGNOSIS — F1994 Other psychoactive substance use, unspecified with psychoactive substance-induced mood disorder: Secondary | ICD-10-CM

## 2019-07-14 DIAGNOSIS — F332 Major depressive disorder, recurrent severe without psychotic features: Secondary | ICD-10-CM

## 2019-07-14 DIAGNOSIS — B20 Human immunodeficiency virus [HIV] disease: Secondary | ICD-10-CM

## 2019-07-14 MED ORDER — BICTEGRAVIR-EMTRICITAB-TENOFOV 50-200-25 MG PO TABS: 1.0000 | ORAL_TABLET | Freq: Every day | ORAL | 11 refills | Status: DC

## 2019-07-14 MED ORDER — MEGESTROL ACETATE 625 MG/5ML PO SUSP: 625.0000 mg | Freq: Every day | ORAL | 0 refills | Status: DC

## 2019-07-14 MED ORDER — QUETIAPINE FUMARATE 200 MG PO TABS: 200.0000 mg | ORAL_TABLET | Freq: Every day | ORAL | 3 refills | Status: DC

## 2019-07-14 MED ORDER — MIRTAZAPINE 7.5 MG PO TABS: 7.5000 mg | ORAL_TABLET | Freq: Every day | ORAL | 3 refills | Status: DC

## 2019-07-14 NOTE — Assessment & Plan Note (Signed)
Screened negative 

## 2019-07-14 NOTE — Assessment & Plan Note (Signed)
Poorly controlled with a zero CD4 count.  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  I do worry with some baseline integrase inh resistance but I do not anticipate it will affect bictegravir.   I will have him return in about 3 weeks for labs.

## 2019-07-14 NOTE — Assessment & Plan Note (Signed)
He is going to get back in to [facility].  He has been there previously. I have refilled his seroquel and remeron which he was on previously and can get from the [facility] program.  He will follow up with [facility] for continuation of those.

## 2019-07-14 NOTE — Assessment & Plan Note (Signed)
I discussed trying to avoid drug use.

## 2019-07-14 NOTE — Assessment & Plan Note (Signed)
He wants to limit his pill burden and does not want OI prophylaxis.   He understands the risk but will focus on his ARVs.

## 2019-07-14 NOTE — Telephone Encounter (Addendum)
[facility] Patient Advocate Encounter  Completed and sent [employer] Advancing Access application for Biktarvy for this patient who is uninsured.    Patient is approved 07/14/2019 through 06/11/2020.  BIN      [id number] PCN    [id number] GRP    [id number] ID        [id number]  Information was faxed to [facility].     Netty Starring. Dimas Aguas CPhT Specialty Pharmacy Patient [facility] for Infectious Disease Phone: [phone] Fax:  [phone]

## 2019-07-14 NOTE — Progress Notes (Signed)
   Subjective:    Patient ID: Kurt Foley, male    DOB: 03-18-1976, 44 y.o.   MRN: [id number]  HPI Here for follow up of HIV He was last seen by the pharmacy team in 2018 and had been out of medication since then.  He was recently hospitalized for n/v and diarrhea.  He states he has had more issues with depression and has not felt his seroquel or remeron have helped much.  His CD4 is not detected and his viral load is 116,000.  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  He previously was on Triumeq and Prezcobix but hoping for a smaller pill.  He has lost weight.  He does use drugs.  He has apoor appetite.     Review of Systems  Constitutional: Positive for activity change, appetite change and unexpected weight change.  Gastrointestinal: Negative for diarrhea.  Skin: Negative for rash.       Objective:   Physical Exam Constitutional:      Appearance: Normal appearance.  Eyes:     General: No scleral icterus. Pulmonary:     Effort: Pulmonary effort is normal.  Skin:    Findings: No rash.  Neurological:     Mental Status: He is alert.  Psychiatric:        Mood and Affect: Mood normal.   SH: + tobacco        Assessment & Plan:

## 2019-07-23 ENCOUNTER — Telehealth: Payer: Self-pay

## 2019-07-23 NOTE — Telephone Encounter (Signed)
Patient called triage stating that he obtained a lawyer and needed to have paperwork faxed to them. Patient requested office notes and labs from his previous appointments to be faxed however no release of information paperwork has been submitted. Informed the patient that office would need documentation allowing [place] to fax private information to legal team. Patient stated he did not have transportation but would figure it out. RN told patient to call back if he needs assistance or if he has further questions.   Hanad Leino Loyola Mast, RN

## 2019-08-06 ENCOUNTER — Ambulatory Visit (INDEPENDENT_AMBULATORY_CARE_PROVIDER_SITE_OTHER): Payer: Self-pay | Admitting: Internal Medicine

## 2019-08-06 ENCOUNTER — Encounter: Payer: Self-pay | Admitting: Internal Medicine

## 2019-08-06 ENCOUNTER — Other Ambulatory Visit: Payer: Self-pay

## 2019-08-06 VITALS — Wt 112.0 lb

## 2019-08-06 DIAGNOSIS — B20 Human immunodeficiency virus [HIV] disease: Secondary | ICD-10-CM

## 2019-08-06 DIAGNOSIS — B353 Tinea pedis: Secondary | ICD-10-CM

## 2019-08-06 DIAGNOSIS — F332 Major depressive disorder, recurrent severe without psychotic features: Secondary | ICD-10-CM

## 2019-08-06 MED ORDER — FLUCONAZOLE 200 MG PO TABS: 200.0000 mg | ORAL_TABLET | Freq: Every day | ORAL | 1 refills | Status: DC

## 2019-08-06 NOTE — Assessment & Plan Note (Signed)
I had refilled his medications and he will see [facility] next week

## 2019-08-06 NOTE — Assessment & Plan Note (Signed)
Will check his labs today, hopefully improving.  Continue [employer]

## 2019-08-06 NOTE — Progress Notes (Signed)
   Subjective:    Patient ID: Kurt Foley, male    DOB: Jan 31, 1976, 44 y.o.   MRN: [id number]  HPI Here for follow up of HIV He had been out of care and now back on medication with Biktarvy.  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  HAving issues with his toes and nails.  No missed doses, no associated n/v/d.    Review of Systems  Constitutional: Negative for fatigue and unexpected weight change.  Gastrointestinal: Negative for diarrhea and nausea.  Skin: Negative for rash.       Objective:   Physical Exam Constitutional:      Appearance: Normal appearance.  Eyes:     General: No scleral icterus. Pulmonary:     Effort: Pulmonary effort is normal.  Musculoskeletal:     Comments: + onychomycosis Some Candidal like areas  Neurological:     General: No focal deficit present.     Mental Status: He is alert.   SH: + tobacco        Assessment & Plan:

## 2019-08-06 NOTE — Assessment & Plan Note (Signed)
Appears to have some fungal skin changes.  Will try fluconazole

## 2019-08-07 LAB — T-HELPER CELL (CD4) - (RCID CLINIC ONLY)
CD4 % Helper T Cell: 5 % — ABNORMAL LOW (ref 33–65)
CD4 T Cell Abs: 40 /uL — ABNORMAL LOW (ref 400–1790)

## 2019-08-09 LAB — COMPLETE METABOLIC PANEL WITH GFR
AG Ratio: 1 (calc) (ref 1.0–2.5)
ALT: 16 U/L (ref 9–46)
AST: 31 U/L (ref 10–40)
Albumin: 3.9 g/dL (ref 3.6–5.1)
Alkaline phosphatase (APISO): 110 U/L (ref 36–130)
BUN: 19 mg/dL (ref 7–25)
CO2: 25 mmol/L (ref 20–32)
Calcium: 9.8 mg/dL (ref 8.6–10.3)
Chloride: 105 mmol/L (ref 98–110)
Creat: 0.77 mg/dL (ref 0.60–1.35)
GFR, Est African American: 127 mL/min/{1.73_m2} (ref >=60)
GFR, Est Non African American: 110 mL/min/{1.73_m2} (ref >=60)
Globulin: 4 g/dL (calc) — ABNORMAL HIGH (ref 1.9–3.7)
Glucose, Bld: 93 mg/dL (ref 65–99)
Potassium: 4.8 mmol/L (ref 3.5–5.3)
Sodium: 138 mmol/L (ref 135–146)
Total Bilirubin: 0.2 mg/dL (ref 0.2–1.2)
Total Protein: 7.9 g/dL (ref 6.1–8.1)

## 2019-08-09 LAB — HIV-1 RNA QUANT-NO REFLEX-BLD
HIV 1 RNA Quant: 161 copies/mL — ABNORMAL HIGH
HIV-1 RNA Quant, Log: 2.21 Log copies/mL — ABNORMAL HIGH

## 2019-08-11 ENCOUNTER — Encounter: Payer: Self-pay | Admitting: Internal Medicine

## 2019-10-08 ENCOUNTER — Ambulatory Visit: Payer: Self-pay | Admitting: Internal Medicine

## 2020-03-25 ENCOUNTER — Emergency Department (HOSPITAL_COMMUNITY)
Admission: EM | Admit: 2020-03-25 | Discharge: 2020-03-25 | Disposition: A | Discharge status: Home / Self Care | Payer: HRSA Program | Attending: Emergency Medicine | Admitting: Emergency Medicine

## 2020-03-25 ENCOUNTER — Other Ambulatory Visit: Payer: Self-pay

## 2020-03-25 ENCOUNTER — Encounter (HOSPITAL_COMMUNITY): Payer: Self-pay

## 2020-03-25 ENCOUNTER — Emergency Department (HOSPITAL_COMMUNITY): Payer: HRSA Program

## 2020-03-25 DIAGNOSIS — R0602 Shortness of breath: Secondary | ICD-10-CM | POA: Diagnosis present

## 2020-03-25 DIAGNOSIS — R059 Cough, unspecified: Secondary | ICD-10-CM

## 2020-03-25 DIAGNOSIS — F17210 Nicotine dependence, cigarettes, uncomplicated: Secondary | ICD-10-CM | POA: Diagnosis not present

## 2020-03-25 DIAGNOSIS — Z21 Asymptomatic human immunodeficiency virus [HIV] infection status: Secondary | ICD-10-CM | POA: Insufficient documentation

## 2020-03-25 DIAGNOSIS — U071 COVID-19: Secondary | ICD-10-CM | POA: Insufficient documentation

## 2020-03-25 LAB — CBC WITH DIFFERENTIAL/PLATELET
Abs Immature Granulocytes: 0.03 10*3/uL (ref 0.00–0.07)
Basophils Absolute: 0 10*3/uL (ref 0.0–0.1)
Basophils Relative: 0 %
Eosinophils Absolute: 0.2 10*3/uL (ref 0.0–0.5)
Eosinophils Relative: 4 %
HCT: 43.8 % (ref 39.0–52.0)
Hemoglobin: 14.6 g/dL (ref 13.0–17.0)
Immature Granulocytes: 1 %
Lymphocytes Relative: 12 %
Lymphs Abs: 0.5 10*3/uL — ABNORMAL LOW (ref 0.7–4.0)
MCH: 31.2 pg (ref 26.0–34.0)
MCHC: 33.3 g/dL (ref 30.0–36.0)
MCV: 93.6 fL (ref 80.0–100.0)
Monocytes Absolute: 0.3 10*3/uL (ref 0.1–1.0)
Monocytes Relative: 8 %
Neutro Abs: 2.9 10*3/uL (ref 1.7–7.7)
Neutrophils Relative %: 75 %
Platelets: 251 10*3/uL (ref 150–400)
RBC: 4.68 MIL/uL (ref 4.22–5.81)
RDW: 13.3 % (ref 11.5–15.5)
WBC: 3.9 10*3/uL — ABNORMAL LOW (ref 4.0–10.5)
nRBC: 0 % (ref 0.0–0.2)

## 2020-03-25 LAB — BASIC METABOLIC PANEL
Anion gap: 8 (ref 5–15)
BUN: 12 mg/dL (ref 6–20)
CO2: 22 mmol/L (ref 22–32)
Calcium: 8.6 mg/dL — ABNORMAL LOW (ref 8.9–10.3)
Chloride: 107 mmol/L (ref 98–111)
Creatinine, Ser: 0.84 mg/dL (ref 0.61–1.24)
GFR, Estimated: >60 mL/min (ref >60)
Glucose, Bld: 105 mg/dL — ABNORMAL HIGH (ref 70–99)
Potassium: 4.2 mmol/L (ref 3.5–5.1)
Sodium: 137 mmol/L (ref 135–145)

## 2020-03-25 LAB — RESPIRATORY PANEL BY RT PCR (FLU A&B, COVID)
Influenza A by PCR: NEGATIVE
Influenza B by PCR: NEGATIVE
SARS Coronavirus 2 by RT PCR: POSITIVE — AB

## 2020-03-25 MED ORDER — SODIUM CHLORIDE 0.9 % IV BOLUS
1000.0000 mL | Freq: Once | INTRAVENOUS | Status: AC
  Administered 2020-03-25: 1000 mL via INTRAVENOUS

## 2020-03-25 NOTE — ED Provider Notes (Signed)
[facility]-EMERGENCY DEPT Provider Note   CSN: [id number] Arrival date & time: 03/25/20  1648     History Chief Complaint  Patient presents with  . Covid Positive  . Shortness of Breath  . Cough    Kurt Foley is a 44 y.o. male.  44 year old male with prior medical history detailed below presents for evaluation.  Patient reports positive Covid test on October 9.  Patient reports that this test was performed at an outpatient testing center here in [place].  He complains of malaise, fatigue, mild shortness of breath, and cough.  The symptoms been ongoing for at least the last 5 days.  Patient is otherwise without specific complaint.  Patient reports that he is HIV positive.  He cannot report his CD4 count. He reports compliance with anti-viral medications.   The history is provided by the patient and medical records.  Illness Location:  Malaise, cough, covid Severity:  Mild Onset quality:  Gradual Duration:  5 days Timing:  Constant Progression:  Waxing and waning Chronicity:  New Associated symptoms: congestion, cough and shortness of breath   Associated symptoms: no fever        Past Medical History:  Diagnosis Date  . Depression   . HIV infection [facility])     Patient Active Problem List   Diagnosis Date Noted  . Tinea pedis 08/06/2019  . At risk for opportunistic infections 07/14/2019  . Dehydration 06/02/2019  . Generalized weakness 06/02/2019  . Cocaine abuse (HCC) 11/25/2018  . 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 Substance induced mood disorder (HCC) 09/16/2015  . Cocaine dependence with cocaine-induced mood disorder (HCC)   . Cannabis use disorder, severe, dependence (HCC) 04/09/2015  . Severe recurrent major depression without psychotic features (HCC) 04/08/2015  . Alcohol abuse 04/08/2015  . Screening examination for venereal disease 03/25/2015  . Encounter for  long-term (current) use of medications 03/25/2015  . Tobacco abuse   . Visual disturbance 11/18/2013  . Human immunodeficiency virus (HIV) disease (HCC) 10/30/2013  . Atopic dermatitis 10/30/2013    History reviewed. No pertinent surgical history.     Family History  Problem Relation Age of Onset  . Diabetes Mother   . Hypertension Mother   . Arthritis Mother     Social History   Tobacco Use  . Smoking status: Current Every Day Smoker    Packs/day: 0.50    Years: 25.00    Pack years: 12.50    Types: Cigarettes  . Smokeless tobacco: Never Used  . Tobacco comment: cutting back  Vaping Use  . Vaping Use: Never used  Substance Use Topics  . Alcohol use: Yes    Alcohol/week: 2.0 standard drinks    Types: 2 Standard drinks or equivalent per week    Comment: 3-4 days a week.Last drink: today  . Drug use: Yes    Types: Marijuana, Cocaine    Comment: Once a week. Last used yesterday.  Cocaine last used today.     Home Medications Prior to Admission medications   Medication Sig Start Date End Date Taking? Authorizing Provider  bictegravir-emtricitabine-tenofovir AF (BIKTARVY) 50-200-25 MG TABS tablet Take 1 tablet by mouth daily. 07/14/19   [name], MD  fluconazole (DIFLUCAN) 200 MG tablet Take 1 tablet (200 mg total) by mouth daily. 08/06/19   [name], MD  megestrol (MEGACE ES) 625 MG/5ML suspension Take 5 mLs (625 mg total) by mouth daily. Patient not taking: Reported on  08/06/2019 07/14/19   [name], MD  mirtazapine (REMERON) 7.5 MG tablet Take 1 tablet (7.5 mg total) by mouth at bedtime. 07/14/19   [name], MD  QUEtiapine (SEROQUEL) 200 MG tablet Take 1 tablet (200 mg total) by mouth at bedtime. Patient not taking: Reported on 08/06/2019 07/14/19   [name], MD    Allergies    Patient has no known allergies.  Review of Systems   Review of Systems  Constitutional: Negative for fever.  HENT: Positive for congestion.   Respiratory:  Positive for cough and shortness of breath.   All other systems reviewed and are negative.   Physical Exam Updated Vital Signs BP (!) 122/96   Pulse (!) 110   Temp 99.3 F (37.4 C) (Oral)   Resp (!) 25   SpO2 97%   Physical Exam Vitals and nursing note reviewed.  Constitutional:      General: He is not in acute distress.    Appearance: He is well-developed.  HENT:     Head: Normocephalic and atraumatic.  Eyes:     Conjunctiva/sclera: Conjunctivae normal.     Pupils: Pupils are equal, round, and reactive to light.  Cardiovascular:     Rate and Rhythm: Normal rate and regular rhythm.     Heart sounds: Normal heart sounds.  Pulmonary:     Effort: Pulmonary effort is normal. No respiratory distress.     Breath sounds: Normal breath sounds.  Abdominal:     General: There is no distension.     Palpations: Abdomen is soft.     Tenderness: There is no abdominal tenderness.  Musculoskeletal:        General: No deformity. Normal range of motion.     Cervical back: Normal range of motion and neck supple.  Skin:    General: Skin is warm and dry.  Neurological:     Mental Status: He is alert and oriented to person, place, and time.     ED Results / Procedures / Treatments   Labs (all labs ordered are listed, but only abnormal results are displayed) Labs Reviewed  BASIC METABOLIC PANEL - Abnormal; Notable for the following components:      Result Value   Glucose, Bld 105 (*)    Calcium 8.6 (*)    All other components within normal limits  CBC WITH DIFFERENTIAL/PLATELET - Abnormal; Notable for the following components:   WBC 3.9 (*)    Lymphs Abs 0.5 (*)    All other components within normal limits  RESPIRATORY PANEL BY RT PCR (FLU A&B, COVID)    EKG EKG Interpretation  Date/Time:  Thursday March 25 2020 16:59:55 EDT Ventricular Rate:  104 PR Interval:    QRS Duration: 86 QT Interval:  327 QTC Calculation: 431 R Axis:   84 Text Interpretation: Sinus tachycardia  Biatrial enlargement Left ventricular hypertrophy Confirmed by Kristine Royal [phone]) on 03/25/2020 5:20:42 PM   Radiology DG Chest Port 1 View  Result Date: 03/25/2020 CLINICAL DATA:  COVID-19 positivity with shortness of breath, initial encounter EXAM: PORTABLE CHEST 1 VIEW COMPARISON:  06/01/2019 FINDINGS: The heart size and mediastinal contours are within normal limits. Both lungs are clear. The visualized skeletal structures are unremarkable. IMPRESSION: No active disease. Electronically Signed   By: Alcide Clever M.D.   On: 03/25/2020 18:12    Procedures Procedures (including critical care time)  Medications Ordered in ED Medications  sodium chloride 0.9 % bolus 1,000 mL (1,000 mLs Intravenous New Bag/Given  03/25/20 1740)    ED Course  I have reviewed the triage vital signs and the nursing notes.  Pertinent labs & imaging results that were available during my care of the patient were reviewed by me and considered in my medical decision making (see chart for details).    MDM Rules/Calculators/A&P                          MDM  Screen complete  Kurt Foley was evaluated in Emergency Department on 03/25/2020 for the symptoms described in the history of present illness. He was evaluated in the context of the global COVID-19 pandemic, which necessitated consideration that the patient might be at risk for infection with the SARS-CoV-2 virus that causes COVID-19. Institutional protocols and algorithms that pertain to the evaluation of patients at risk for COVID-19 are in a state of rapid change based on information released by regulatory bodies including the CDC and federal and state organizations. These policies and algorithms were followed during the patient's care in the ED.   Patient is presenting for evaluation of reported cough and malaise.  Patient with reported positive Covid test on 10/9.  This testing was reportedly performed in an outpatient testing center here in  [place].  Repeat Covid testing is pending.  Patient without evidence of hypoxia, pneumonia, dehydration, or significant other reason for inpatient treatment.  Patient with known history of HIV. It is unclear to this provider if the patient takes his anti-virals on a regular basis. He initially reported full compliance -- but then wavered a bit on this topic.   Patient is referred to the [facility] infusion clinic for possible infusion.  Patient understands need for close FU.   Strict return precautions given and understood.  Final Clinical Impression(s) / ED Diagnoses Final diagnoses:  Cough    Rx / DC Orders ED Discharge Orders    None       Wynetta Fines, MD 03/25/20 [place]

## 2020-03-25 NOTE — ED Triage Notes (Signed)
Pt stated he has tested COVID+ 10/9. Pt states he feels like he cannot breathe, feels SOB at all times. Pt also c/o cough, fever, and fatigue. Pt reports a temp of 99.9 last night.

## 2020-03-25 NOTE — ED Notes (Signed)
Discharged no concerns 

## 2020-03-25 NOTE — Discharge Instructions (Addendum)
Please return for any problem.   Follow up with your ID/HIV clinic as instructed.

## 2020-03-25 NOTE — ED Notes (Signed)
Xray bedside.

## 2020-03-26 ENCOUNTER — Other Ambulatory Visit: Payer: Self-pay | Admitting: Family

## 2020-03-26 ENCOUNTER — Telehealth: Payer: Self-pay | Admitting: Family

## 2020-03-26 DIAGNOSIS — B20 Human immunodeficiency virus [HIV] disease: Secondary | ICD-10-CM

## 2020-03-26 DIAGNOSIS — Z609 Problem related to social environment, unspecified: Secondary | ICD-10-CM

## 2020-03-26 DIAGNOSIS — U071 COVID-19: Secondary | ICD-10-CM

## 2020-03-26 NOTE — Progress Notes (Signed)
I connected by phone with Kurt Foley on 03/26/2020 at 9:29 AM to discuss the potential use of a new treatment for mild to moderate COVID-19 viral infection in non-hospitalized patients.  This patient is a 44 y.o. male that meets the FDA criteria for Emergency Use Authorization of COVID monoclonal antibody casirivimab/imdevimab or bamlanivimab/eteseviamb.  Has a (+) direct SARS-CoV-2 viral test result  Has mild or moderate COVID-19   Is NOT hospitalized due to COVID-19  Is within 10 days of symptom onset  Has at least one of the high risk factor(s) for progression to severe COVID-19 and/or hospitalization as defined in EUA.  Specific high risk criteria : Immunosuppressive Disease or Treatment and Other high risk medical condition per CDC:  High social vulnerability score   I have spoken and communicated the following to the patient or parent/caregiver regarding COVID monoclonal antibody treatment:  1. FDA has authorized the emergency use for the treatment of mild to moderate COVID-19 in adults and pediatric patients with positive results of direct SARS-CoV-2 viral testing who are 46 years of age and older weighing at least 40 kg, and who are at high risk for progressing to severe COVID-19 and/or hospitalization.  2. The significant known and potential risks and benefits of COVID monoclonal antibody, and the extent to which such potential risks and benefits are unknown.  3. Information on available alternative treatments and the risks and benefits of those alternatives, including clinical trials.  4. Patients treated with COVID monoclonal antibody should continue to self-isolate and use infection control measures (e.g., wear mask, isolate, social distance, avoid sharing personal items, clean and disinfect "high touch" surfaces, and frequent handwashing) according to CDC guidelines.   5. The patient or parent/caregiver has the option to accept or refuse COVID monoclonal antibody  treatment.  After reviewing this information with the patient, the patient has agreed to receive one of the available covid 19 monoclonal antibodies and will be provided an appropriate fact sheet prior to infusion.   Jeanine Luz, FNP 03/26/2020 9:29 AM

## 2020-03-26 NOTE — Telephone Encounter (Signed)
Called to Discuss with patient about Covid symptoms and the use of the monoclonal antibody infusion for those with mild to moderate Covid symptoms and at a high risk of hospitalization.     Pt appears to qualify for this infusion due to co-morbid conditions and/or a member of an at-risk group in accordance with the FDA Emergency Use Authorization.    Kurt Foley was seen in the [facility] ED on 10/14 with malaise, fatigue, mild shortness of breath and cough.  Symptom onset was approximately 10/9.  Tested positive for Covid at outside facility on 10/9.  Qualifying risk factors include his social risk vulnerability score as well as HIV.  Spoke with Kurt Foley regarding treatment with monoclonal antibody therapy including the risks, benefits, and financial information.  He wishes to continue with treatment at this time.  Hello Kurt Foley,   You have been scheduled to receive the monoclonal antibody therapy at [facility]: 03/27/20 at 9:30am   If you have been tested outside of a [facility] - you MUST bring a copy of your positive test with you the morning of your appointment. You may take a photo of this and upload to your MyChart portal or have the testing facility fax the result to [phone]    The address for the infusion clinic site is:  --GPS address is [address] - the parking is located near [employer] building where you will see  COVID19 Infusion feather banner marking the entrance to parking.   (see photos below)            --Enter into the 2nd entrance where the "wave, flag banner" is at the road. Turn into this 2nd entrance and immediately turn left to park in 1 of the 5 parking spots.   --Please stay in your car and call the desk for assistance inside [phone].   --Average time in department is roughly 2 hours for [employer] treatment - this includes preparation of the medication, IV start and the required 1 hour monitoring after the infusion.     Should you develop worsening shortness of breath, chest pain or severe breathing problems please do not wait for this appointment and go to the Emergency room for evaluation and treatment. You will undergo another oxygen screen before your infusion to ensure this is the best treatment option for you. There is a chance that the best decision may be to send you to the Emergency Room for evaluation at the time of your appointment.   The day of your visit you should: Marland Kitchen Get plenty of rest the night before and drink plenty of water . Eat a light meal/snack before coming and take your medications as prescribed  . Wear warm, comfortable clothes with a shirt that can roll-up over the elbow (will need IV start).  . Wear a mask  . Consider bringing some activity to help pass the time  Many commercial insurers are waiving bills related to COVID treatment however some have ranged from $300-640. We are starting to see some insurers send bills to patients later for the administration of the medication - we are learning more information but you may receive a bill after your appointment.  Please contact your insurance agent to discuss prior to your appointment if you would like further details about billing specific to your policy.    The CPT code is [phone] for your reference.    Marcos Eke, NP 03/26/2020 9:28 AM

## 2020-03-27 ENCOUNTER — Ambulatory Visit (HOSPITAL_COMMUNITY)
Admission: RE | Admit: 2020-03-27 | Discharge: 2020-03-27 | Disposition: A | Discharge status: Home / Self Care | Payer: HRSA Program | Source: Ambulatory Visit | Attending: Pulmonary Disease | Admitting: Pulmonary Disease

## 2020-03-27 DIAGNOSIS — U071 COVID-19: Secondary | ICD-10-CM | POA: Diagnosis present

## 2020-03-27 DIAGNOSIS — B20 Human immunodeficiency virus [HIV] disease: Secondary | ICD-10-CM | POA: Insufficient documentation

## 2020-03-27 DIAGNOSIS — Z609 Problem related to social environment, unspecified: Secondary | ICD-10-CM | POA: Diagnosis present

## 2020-03-27 MED ORDER — METHYLPREDNISOLONE SODIUM SUCC 125 MG IJ SOLR: 125.0000 mg | Freq: Once | INTRAMUSCULAR | Status: DC | PRN

## 2020-03-27 MED ORDER — SODIUM CHLORIDE 0.9 % IV SOLN: Freq: Once | INTRAVENOUS | Status: AC

## 2020-03-27 MED ORDER — FAMOTIDINE IN NACL 20-0.9 MG/50ML-% IV SOLN: 20.0000 mg | Freq: Once | INTRAVENOUS | Status: DC | PRN

## 2020-03-27 MED ORDER — ALBUTEROL SULFATE HFA 108 (90 BASE) MCG/ACT IN AERS: 2.0000 | INHALATION_SPRAY | Freq: Once | RESPIRATORY_TRACT | Status: DC | PRN

## 2020-03-27 MED ORDER — DIPHENHYDRAMINE HCL 50 MG/ML IJ SOLN: 50.0000 mg | Freq: Once | INTRAMUSCULAR | Status: DC | PRN

## 2020-03-27 MED ORDER — SODIUM CHLORIDE 0.9 % IV SOLN: INTRAVENOUS | Status: DC | PRN

## 2020-03-27 MED ORDER — EPINEPHRINE 0.3 MG/0.3ML IJ SOAJ: 0.3000 mg | Freq: Once | INTRAMUSCULAR | Status: DC | PRN

## 2020-03-27 NOTE — Discharge Instructions (Signed)

## 2020-03-27 NOTE — Progress Notes (Signed)
  Diagnosis: COVID-19  Physician: Dr. Wright  Procedure: Covid Infusion Clinic Med: bamlanivimab\etesevimab infusion - Provided patient with bamlanimivab\etesevimab fact sheet for patients, parents and caregivers prior to infusion.  Complications: No immediate complications noted.  Discharge: Discharged home   Marlys Stegmaier E Daton Szilagyi 03/27/2020   

## 2020-04-15 ENCOUNTER — Other Ambulatory Visit: Payer: Self-pay

## 2020-04-15 ENCOUNTER — Encounter (HOSPITAL_COMMUNITY): Payer: Self-pay

## 2020-04-15 ENCOUNTER — Emergency Department (HOSPITAL_COMMUNITY)
Admission: EM | Admit: 2020-04-15 | Discharge: 2020-04-16 | Disposition: A | Discharge status: Home / Self Care | Payer: Self-pay | Attending: Emergency Medicine | Admitting: Emergency Medicine

## 2020-04-15 DIAGNOSIS — F1494 Cocaine use, unspecified with cocaine-induced mood disorder: Secondary | ICD-10-CM | POA: Insufficient documentation

## 2020-04-15 DIAGNOSIS — F1420 Cocaine dependence, uncomplicated: Secondary | ICD-10-CM

## 2020-04-15 DIAGNOSIS — F17210 Nicotine dependence, cigarettes, uncomplicated: Secondary | ICD-10-CM | POA: Insufficient documentation

## 2020-04-15 DIAGNOSIS — R45851 Suicidal ideations: Secondary | ICD-10-CM | POA: Insufficient documentation

## 2020-04-15 DIAGNOSIS — F1590 Other stimulant use, unspecified, uncomplicated: Secondary | ICD-10-CM | POA: Insufficient documentation

## 2020-04-15 DIAGNOSIS — R440 Auditory hallucinations: Secondary | ICD-10-CM | POA: Insufficient documentation

## 2020-04-15 DIAGNOSIS — F332 Major depressive disorder, recurrent severe without psychotic features: Secondary | ICD-10-CM | POA: Insufficient documentation

## 2020-04-15 DIAGNOSIS — F1994 Other psychoactive substance use, unspecified with psychoactive substance-induced mood disorder: Secondary | ICD-10-CM | POA: Diagnosis present

## 2020-04-15 DIAGNOSIS — Z760 Encounter for issue of repeat prescription: Secondary | ICD-10-CM | POA: Insufficient documentation

## 2020-04-15 DIAGNOSIS — F600 Paranoid personality disorder: Secondary | ICD-10-CM | POA: Insufficient documentation

## 2020-04-15 DIAGNOSIS — R443 Hallucinations, unspecified: Secondary | ICD-10-CM

## 2020-04-15 DIAGNOSIS — M7989 Other specified soft tissue disorders: Secondary | ICD-10-CM | POA: Insufficient documentation

## 2020-04-15 LAB — RAPID URINE DRUG SCREEN, HOSP PERFORMED
Amphetamines: NOT DETECTED
Barbiturates: NOT DETECTED
Benzodiazepines: NOT DETECTED
Cocaine: POSITIVE — AB
Opiates: NOT DETECTED
Tetrahydrocannabinol: NOT DETECTED

## 2020-04-15 LAB — URINALYSIS, ROUTINE W REFLEX MICROSCOPIC
Bacteria, UA: NONE SEEN
Bilirubin Urine: NEGATIVE
Glucose, UA: NEGATIVE mg/dL
Ketones, ur: NEGATIVE mg/dL
Leukocytes,Ua: NEGATIVE
Nitrite: NEGATIVE
Protein, ur: 100 mg/dL — AB
Specific Gravity, Urine: 1.024 (ref 1.005–1.030)
pH: 5 (ref 5.0–8.0)

## 2020-04-15 LAB — CBC WITH DIFFERENTIAL/PLATELET
Abs Immature Granulocytes: 0.02 10*3/uL (ref 0.00–0.07)
Basophils Absolute: 0 10*3/uL (ref 0.0–0.1)
Basophils Relative: 0 %
Eosinophils Absolute: 0.1 10*3/uL (ref 0.0–0.5)
Eosinophils Relative: 5 %
HCT: 44.4 % (ref 39.0–52.0)
Hemoglobin: 15 g/dL (ref 13.0–17.0)
Immature Granulocytes: 1 %
Lymphocytes Relative: 17 %
Lymphs Abs: 0.4 10*3/uL — ABNORMAL LOW (ref 0.7–4.0)
MCH: 31.4 pg (ref 26.0–34.0)
MCHC: 33.8 g/dL (ref 30.0–36.0)
MCV: 93.1 fL (ref 80.0–100.0)
Monocytes Absolute: 0.4 10*3/uL (ref 0.1–1.0)
Monocytes Relative: 15 %
Neutro Abs: 1.5 10*3/uL — ABNORMAL LOW (ref 1.7–7.7)
Neutrophils Relative %: 62 %
Platelets: 219 10*3/uL (ref 150–400)
RBC: 4.77 MIL/uL (ref 4.22–5.81)
RDW: 13.8 % (ref 11.5–15.5)
WBC: 2.4 10*3/uL — ABNORMAL LOW (ref 4.0–10.5)
nRBC: 0 % (ref 0.0–0.2)

## 2020-04-15 LAB — BASIC METABOLIC PANEL
Anion gap: 11 (ref 5–15)
BUN: 11 mg/dL (ref 6–20)
CO2: 21 mmol/L — ABNORMAL LOW (ref 22–32)
Calcium: 8.9 mg/dL (ref 8.9–10.3)
Chloride: 101 mmol/L (ref 98–111)
Creatinine, Ser: 1.04 mg/dL (ref 0.61–1.24)
GFR, Estimated: >60 mL/min (ref >60)
Glucose, Bld: 146 mg/dL — ABNORMAL HIGH (ref 70–99)
Potassium: 4.2 mmol/L (ref 3.5–5.1)
Sodium: 133 mmol/L — ABNORMAL LOW (ref 135–145)

## 2020-04-15 LAB — ETHANOL: Alcohol, Ethyl (B): <10 mg/dL (ref <10)

## 2020-04-15 LAB — ACETAMINOPHEN LEVEL: Acetaminophen (Tylenol), Serum: <10 ug/mL — ABNORMAL LOW (ref 10–30)

## 2020-04-15 LAB — SALICYLATE LEVEL: Salicylate Lvl: <7 mg/dL — ABNORMAL LOW (ref 7.0–30.0)

## 2020-04-15 MED ORDER — DIPHENHYDRAMINE HCL 25 MG PO CAPS
25.0000 mg | ORAL_CAPSULE | Freq: Once | ORAL | Status: AC
  Administered 2020-04-16: 25 mg via ORAL
  Filled 2020-04-15: qty 1

## 2020-04-15 NOTE — ED Notes (Signed)
Patient complaining of itching. Patient seen scratching and welts noted on various parts of his body. MD made aware.

## 2020-04-15 NOTE — ED Notes (Signed)
Pt came back to hall bed with Red pocket knife on hip. Requested to put knife behind desk in biohazard bag until pt is discharged to which he obliged. Knife is at desk infront of rooms 16 and 17 with pt label.

## 2020-04-15 NOTE — ED Notes (Signed)
Pt belongings in cabinet above nurse desk

## 2020-04-15 NOTE — ED Notes (Signed)
Pt given sandwich and soda as requested.  NADN.  Will continue to monitor.

## 2020-04-15 NOTE — ED Provider Notes (Signed)
[facility]-EMERGENCY DEPT Provider Note   CSN: [id number] Arrival date & time: 04/15/20  8366     History Chief Complaint  Patient presents with  . Medication Refill  . Foot Swelling    Kurt Foley is a 44 y.o. male.  44 year old male with prior medical history as detailed below presents for evaluation.  Patient reports that he is "out of his" medications.  He reports increased auditory hallucinations.  He reports that he feels unsafe.  He reports feeling that he may injure himself.  He denies specific plan to harm himself.  He is without medical complaint.  He denies recent fever, cough, shortness of breath, nausea, vomiting, diarrhea pain, other specific medical complaint.  The history is provided by the patient and medical records.  Mental Health Problem Presenting symptoms: hallucinations and suicidal thoughts   Degree of incapacity (severity):  Mild Onset quality:  Unable to specify Timing:  Unable to specify Progression:  Unable to specify Treatment compliance:  Unable to specify      Past Medical History:  Diagnosis Date  . Depression   . HIV infection [facility])     Patient Active Problem List   Diagnosis Date Noted  . Tinea pedis 08/06/2019  . At risk for opportunistic infections 07/14/2019  . Dehydration 06/02/2019  . Generalized weakness 06/02/2019  . Cocaine abuse (HCC) 11/25/2018  . 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 Substance induced mood disorder (HCC) 09/16/2015  . Cocaine dependence with cocaine-induced mood disorder (HCC)   . Cannabis use disorder, severe, dependence (HCC) 04/09/2015  . Severe recurrent major depression without psychotic features (HCC) 04/08/2015  . Alcohol abuse 04/08/2015  . Screening examination for venereal disease 03/25/2015  . Encounter for long-term (current) use of medications 03/25/2015  . Tobacco abuse   . Visual disturbance 11/18/2013  .  Human immunodeficiency virus (HIV) disease (HCC) 10/30/2013  . Atopic dermatitis 10/30/2013    History reviewed. No pertinent surgical history.     Family History  Problem Relation Age of Onset  . Diabetes Mother   . Hypertension Mother   . Arthritis Mother     Social History   Tobacco Use  . Smoking status: Current Every Day Smoker    Packs/day: 0.50    Years: 25.00    Pack years: 12.50    Types: Cigarettes  . Smokeless tobacco: Never Used  . Tobacco comment: cutting back  Vaping Use  . Vaping Use: Never used  Substance Use Topics  . Alcohol use: Yes    Alcohol/week: 2.0 standard drinks    Types: 2 Standard drinks or equivalent per week    Comment: 3-4 days a week.Last drink: today  . Drug use: Yes    Types: Marijuana, Cocaine    Comment: Once a week. Last used yesterday.  Cocaine last used today.     Home Medications Prior to Admission medications   Medication Sig Start Date End Date Taking? Authorizing Provider  bictegravir-emtricitabine-tenofovir AF (BIKTARVY) 50-200-25 MG TABS tablet Take 1 tablet by mouth daily. 07/14/19   [name], MD  fluconazole (DIFLUCAN) 200 MG tablet Take 1 tablet (200 mg total) by mouth daily. 08/06/19   [name], MD  megestrol (MEGACE ES) 625 MG/5ML suspension Take 5 mLs (625 mg total) by mouth daily. Patient not taking: Reported on 08/06/2019 07/14/19   [name], MD  mirtazapine (REMERON) 7.5 MG tablet Take 1 tablet (7.5 mg total) by mouth at  bedtime. 07/14/19   [name], MD  QUEtiapine (SEROQUEL) 200 MG tablet Take 1 tablet (200 mg total) by mouth at bedtime. Patient not taking: Reported on 08/06/2019 07/14/19   [name], MD    Allergies    Patient has no known allergies.  Review of Systems   Review of Systems  Psychiatric/Behavioral: Positive for hallucinations and suicidal ideas.  All other systems reviewed and are negative.   Physical Exam Updated Vital Signs BP (!) 131/109 (BP Location: Left Arm)    Pulse 93   Temp 98.3 F (36.8 C) (Oral)   Resp 16   SpO2 100%   Physical Exam Vitals and nursing note reviewed.  Constitutional:      General: He is not in acute distress.    Appearance: Normal appearance. He is well-developed.  HENT:     Head: Normocephalic and atraumatic.  Eyes:     Conjunctiva/sclera: Conjunctivae normal.     Pupils: Pupils are equal, round, and reactive to light.  Cardiovascular:     Rate and Rhythm: Normal rate and regular rhythm.     Heart sounds: Normal heart sounds.  Pulmonary:     Effort: Pulmonary effort is normal. No respiratory distress.     Breath sounds: Normal breath sounds.  Abdominal:     General: There is no distension.     Palpations: Abdomen is soft.     Tenderness: There is no abdominal tenderness.  Musculoskeletal:        General: No deformity. Normal range of motion.     Cervical back: Normal range of motion and neck supple.  Skin:    General: Skin is warm and dry.  Neurological:     Mental Status: He is alert and oriented to person, place, and time.  Psychiatric:     Comments: Patient reports auditory hallucinations and thoughts of self-harm     ED Results / Procedures / Treatments   Labs (all labs ordered are listed, but only abnormal results are displayed) Labs Reviewed  RAPID URINE DRUG SCREEN, HOSP PERFORMED - Abnormal; Notable for the following components:      Result Value   Cocaine POSITIVE (*)    All other components within normal limits  URINALYSIS, ROUTINE W REFLEX MICROSCOPIC - Abnormal; Notable for the following components:   Hgb urine dipstick SMALL (*)    Protein, ur 100 (*)    All other components within normal limits  ACETAMINOPHEN LEVEL - Abnormal; Notable for the following components:   Acetaminophen (Tylenol), Serum <10 (*)    All other components within normal limits  BASIC METABOLIC PANEL - Abnormal; Notable for the following components:   Sodium 133 (*)    CO2 21 (*)    Glucose, Bld 146 (*)     All other components within normal limits  SALICYLATE LEVEL - Abnormal; Notable for the following components:   Salicylate Lvl <7.0 (*)    All other components within normal limits  CBC WITH DIFFERENTIAL/PLATELET - Abnormal; Notable for the following components:   WBC 2.4 (*)    Neutro Abs 1.5 (*)    Lymphs Abs 0.4 (*)    All other components within normal limits  ETHANOL    EKG None  Radiology No results found.  Procedures Procedures (including critical care time)  Medications Ordered in ED Medications - No data to display  ED Course  I have reviewed the triage vital signs and the nursing notes.  Pertinent labs & imaging results that were  available during my care of the patient were reviewed by me and considered in my medical decision making (see chart for details).    MDM Rules/Calculators/A&P                          MDM  Screen complete  KHAYRI KARGBO was evaluated in Emergency Department on 04/15/2020 for the symptoms described in the history of present illness. He was evaluated in the context of the global COVID-19 pandemic, which necessitated consideration that the patient might be at risk for infection with the SARS-CoV-2 virus that causes COVID-19. Institutional protocols and algorithms that pertain to the evaluation of patients at risk for COVID-19 are in a state of rapid change based on information released by regulatory bodies including the CDC and federal and state organizations. These policies and algorithms were followed during the patient's care in the ED.  Patient is presenting for requested mental health evaluation.  Work-up in the ED does not reveal evidence of significant acute medical pathology that would interfere with further psychiatric assessment and treatment.  Of note, patient with acute Covid infection in October 2021.  This was treated with monoclonal antibody infusion.  Repeat Covid testing is not indicated today.  Repeat Covid testing  likely would result in positive test (even though patient has well-documented recent acute infection).     Final Clinical Impression(s) / ED Diagnoses Final diagnoses:  Hallucinations    Rx / DC Orders ED Discharge Orders    None       Wynetta Fines, MD 04/15/20 1408

## 2020-04-15 NOTE — ED Triage Notes (Signed)
Pt states that he hasn't had his medications in a while (regeneron, seroquel, and others he can't remember) and his mind is racing. Pt states that he is also having swelling in his feet.  Denies SI/HI.
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
## 2020-04-16 ENCOUNTER — Emergency Department (HOSPITAL_COMMUNITY)
Admission: EM | Admit: 2020-04-16 | Discharge: 2020-04-17 | Disposition: A | Discharge status: Home / Self Care | Payer: Self-pay | Attending: Emergency Medicine | Admitting: Emergency Medicine

## 2020-04-16 ENCOUNTER — Emergency Department (HOSPITAL_COMMUNITY)
Admission: EM | Admit: 2020-04-16 | Discharge: 2020-04-16 | Disposition: A | Discharge status: Home / Self Care | Payer: Self-pay | Attending: Emergency Medicine | Admitting: Emergency Medicine

## 2020-04-16 ENCOUNTER — Other Ambulatory Visit: Payer: Self-pay

## 2020-04-16 ENCOUNTER — Encounter (HOSPITAL_COMMUNITY): Payer: Self-pay | Admitting: *Deleted

## 2020-04-16 DIAGNOSIS — F17210 Nicotine dependence, cigarettes, uncomplicated: Secondary | ICD-10-CM | POA: Insufficient documentation

## 2020-04-16 DIAGNOSIS — R45850 Homicidal ideations: Secondary | ICD-10-CM | POA: Insufficient documentation

## 2020-04-16 DIAGNOSIS — Z765 Malingerer [conscious simulation]: Secondary | ICD-10-CM | POA: Insufficient documentation

## 2020-04-16 DIAGNOSIS — F1590 Other stimulant use, unspecified, uncomplicated: Secondary | ICD-10-CM | POA: Insufficient documentation

## 2020-04-16 DIAGNOSIS — R440 Auditory hallucinations: Secondary | ICD-10-CM | POA: Insufficient documentation

## 2020-04-16 DIAGNOSIS — F1420 Cocaine dependence, uncomplicated: Secondary | ICD-10-CM

## 2020-04-16 DIAGNOSIS — Z5900 Homelessness unspecified: Secondary | ICD-10-CM | POA: Insufficient documentation

## 2020-04-16 MED ORDER — DIPHENHYDRAMINE HCL 25 MG PO CAPS
ORAL_CAPSULE | ORAL | Status: AC
  Filled 2020-04-16: qty 1

## 2020-04-16 MED ORDER — MIRTAZAPINE 15 MG PO TABS
7.5000 mg | ORAL_TABLET | Freq: Every day | ORAL | 1 refills | Status: DC
Start: 2020-04-16 — End: 2021-02-24

## 2020-04-16 MED ORDER — DIPHENHYDRAMINE HCL 25 MG PO CAPS
25.0000 mg | ORAL_CAPSULE | Freq: Once | ORAL | Status: AC
  Administered 2020-04-16: 25 mg via ORAL

## 2020-04-16 MED ORDER — QUETIAPINE FUMARATE 200 MG PO TABS
200.0000 mg | ORAL_TABLET | Freq: Every day | ORAL | 1 refills | Status: DC
Start: 2020-04-16 — End: 2021-02-24

## 2020-04-16 NOTE — ED Notes (Signed)
Pt called x 3  No answer. 

## 2020-04-16 NOTE — ED Notes (Signed)
Pt reports hives to back of neck and itching.  Pt also reports right foot pain.  Pt is able ambulated with a steady gait.  No obvious deformity noted to feet but pt reports limited range of movement on right foot.  PA made aware.

## 2020-04-16 NOTE — ED Notes (Signed)
This RN spoke to [place] with peer support and he states that he cannot find placement for patient due to outstanding warrants. Patient informed and ready for discharge.

## 2020-04-16 NOTE — Discharge Instructions (Addendum)
Please use resources and prescriptions as provided earlier today.

## 2020-04-16 NOTE — BH Assessment (Signed)
[facility] Assessment Progress Note  Per Berneice Heinrich, NP, this voluntary pt does not require psychiatric hospitalization at this time.  Pt is psychiatrically cleared.  Discharge instructions include referral information for [facility] with [facility] as an alternative.  Pt would also benefit from seeing Peer Support Specialists, and a peer support consult has been ordered for pt.  EDP Alvester Chou, MD and pt's nurse, Abby, have been notified.  Doylene Canning, MA Triage Specialist [phone]

## 2020-04-16 NOTE — Consult Note (Signed)
[facility] Psych ED Discharge  04/16/2020 9:55 AM Kurt Foley  MRN:  [id number] Principal Problem: Cocaine use disorder, severe, dependence (HCC) Discharge Diagnoses: Principal Problem:   Cocaine use disorder, severe, dependence (HCC) Active Problems:   Substance induced mood disorder (HCC)   Subjective: Patient states "I just need some help, I am homeless and I do not have any clothes or anything."  Patient reports current stressors include homelessness and substance use disorders.  Patient reports he is currently homeless in [place] times approximately 3 months.  Patient denies access to weapons.  Patient reports he is currently not employed.  Patient endorses daily alcohol use, daily marijuana use, and cocaine use approximately 4 times per week.  Patient endorses auditory hallucinations when using crack cocaine, denies both auditory and visual hallucinations currently.  Patient reports he has recently missed a court date in [place] and plans to follow-up regarding this court date after substance use treatment completed.  Patient reports he is treated outpatient by [facility] for major depressive disorder.  Patient reports plan to follow-up with outpatient psychiatric treatment at [facility].  Discussed restarting home medications, patient agrees with this plan.  Patient assessed by nurse practitioner.  Patient alert and oriented, answers appropriately.  Patient pleasant cooperative during assessment.  Patient denies suicidal and homicidal ideations.  There is no evidence of delusional thought content and patient does not appear to be responding to internal stimuli.  Patient endorses average sleep and appetite.  Patient reports he would like to discharge to long-term substance use treatment if available.  Patient offered support and encouragement.    Total Time spent with patient: 30 minutes  Past Psychiatric History: Cocaine use disorder, substance-induced mood  disorder, major depressive disorder, recurrent severe, alcohol abuse, cannabis use disorder  Past Medical History:  Past Medical History:  Diagnosis Date  . Depression   . HIV infection (HCC)    History reviewed. No pertinent surgical history. Family History:  Family History  Problem Relation Age of Onset  . Diabetes Mother   . Hypertension Mother   . Arthritis Mother    Family Psychiatric  History: None reported Social History:  Social History   Substance and Sexual Activity  Alcohol Use Yes  . Alcohol/week: 2.0 standard drinks  . Types: 2 Standard drinks or equivalent per week   Comment: 3-4 days a week.Last drink: today     Social History   Substance and Sexual Activity  Drug Use Yes  . Types: Marijuana, Cocaine   Comment: Once a week. Last used yesterday.  Cocaine last used today.     Social History   Socioeconomic History  . Marital status: Single    Spouse name: Not on file  . Number of children: Not on file  . Years of education: Not on file  . Highest education level: Not on file  Occupational History  . Not on file  Tobacco Use  . Smoking status: Current Every Day Smoker    Packs/day: 0.50    Years: 25.00    Pack years: 12.50    Types: Cigarettes  . Smokeless tobacco: Never Used  . Tobacco comment: cutting back  Vaping Use  . Vaping Use: Never used  Substance and Sexual Activity  . Alcohol use: Yes    Alcohol/week: 2.0 standard drinks    Types: 2 Standard drinks or equivalent per week    Comment: 3-4 days a week.Last drink: today  . Drug use: Yes    Types: Marijuana, Cocaine  Comment: Once a week. Last used yesterday.  Cocaine last used today.   Marland Kitchen Sexual activity: Not Currently    Partners: Male    Comment: encouraged condom use  Other Topics Concern  . Not on file  Social History Narrative  . Not on file   Social Determinants of Health   Financial Resource Strain:   . Difficulty of Paying Living Expenses: Not on file  Food  Insecurity:   . Worried About Programme researcher, broadcasting/film/video in the Last Year: Not on file  . Ran Out of Food in the Last Year: Not on file  Transportation Needs:   . Lack of Transportation (Medical): Not on file  . Lack of Transportation (Non-Medical): Not on file  Physical Activity:   . Days of Exercise per Week: Not on file  . Minutes of Exercise per Session: Not on file  Stress:   . Feeling of Stress : Not on file  Social Connections:   . Frequency of Communication with Friends and Family: Not on file  . Frequency of Social Gatherings with Friends and Family: Not on file  . Attends Religious Services: Not on file  . Active Member of Clubs or Organizations: Not on file  . Attends Banker Meetings: Not on file  . Marital Status: Not on file    Has this patient used any form of tobacco in the last 30 days? (Cigarettes, Smokeless Tobacco, Cigars, and/or Pipes) A prescription for an FDA-approved tobacco cessation medication was offered at discharge and the patient refused  Current Medications: Current Facility-Administered Medications  Medication Dose Route Frequency Provider Last Rate Last Admin  . diphenhydrAMINE (BENADRYL) 25 mg capsule            Current Outpatient Medications  Medication Sig Dispense Refill  . bictegravir-emtricitabine-tenofovir AF (BIKTARVY) 50-200-25 MG TABS tablet Take 1 tablet by mouth daily. 30 tablet 11  . mirtazapine (REMERON) 7.5 MG tablet Take 1 tablet (7.5 mg total) by mouth at bedtime. 30 tablet 3  . QUEtiapine (SEROQUEL) 200 MG tablet Take 1 tablet (200 mg total) by mouth at bedtime. 30 tablet 3  . fluconazole (DIFLUCAN) 200 MG tablet Take 1 tablet (200 mg total) by mouth daily. (Patient not taking: Reported on 04/15/2020) 30 tablet 1  . megestrol (MEGACE ES) 625 MG/5ML suspension Take 5 mLs (625 mg total) by mouth daily. (Patient not taking: Reported on 08/06/2019) 150 mL 0   PTA Medications: (Not in a hospital  admission)   Musculoskeletal: Strength & Muscle Tone: within normal limits Gait & Station: normal Patient leans: N/A  Psychiatric Specialty Exam: Physical Exam Vitals and nursing note reviewed.  Constitutional:      Appearance: He is well-developed.  HENT:     Head: Normocephalic.  Cardiovascular:     Rate and Rhythm: Normal rate.  Pulmonary:     Effort: Pulmonary effort is normal.  Neurological:     Mental Status: He is alert and oriented to person, place, and time.  Psychiatric:        Attention and Perception: Attention and perception normal.        Mood and Affect: Mood and affect normal.        Speech: Speech normal.        Behavior: Behavior normal. Behavior is cooperative.        Thought Content: Thought content normal.        Cognition and Memory: Cognition and memory normal.        Judgment:  Judgment normal.     Review of Systems  Constitutional: Negative.   HENT: Negative.   Eyes: Negative.   Respiratory: Negative.   Cardiovascular: Negative.   Gastrointestinal: Negative.   Genitourinary: Negative.   Musculoskeletal: Negative.   Skin: Negative.   Neurological: Negative.   Psychiatric/Behavioral: Negative.     Blood pressure (!) 136/103, pulse (!) 115, temperature 98.4 F (36.9 C), temperature source Oral, resp. rate 18, SpO2 92 %.There is no height or weight on file to calculate BMI.  General Appearance: Casual  Eye Contact:  Good  Speech:  Clear and Coherent and Normal Rate  Volume:  Normal  Mood:  Anxious  Affect:  Appropriate and Congruent  Thought Process:  Coherent, Goal Directed and Descriptions of Associations: Intact  Orientation:  Full (Time, Place, and Person)  Thought Content:  WDL and Logical  Suicidal Thoughts:  No  Homicidal Thoughts:  No  Memory:  Immediate;   Fair Recent;   Fair Remote;   Fair  Judgement:  Fair  Insight:  Good  Psychomotor Activity:  Normal  Concentration:  Concentration: Fair and Attention Span: Fair  Recall:   [employer] of Knowledge:  Fair  Language:  Fair  Akathisia:  No  Handed:  Right  AIMS (if indicated):     Assets:  Communication Skills Desire for Improvement Physical Health Resilience Social Support  ADL's:  Intact  Cognition:  WNL  Sleep:        Demographic Factors:  Male  Loss Factors: NA  Historical Factors: NA  Risk Reduction Factors:   Positive social support, Positive therapeutic relationship and Positive coping skills or problem solving skills  Continued Clinical Symptoms:  Alcohol/Substance Abuse/Dependencies  Cognitive Features That Contribute To Risk:  None    Suicide Risk:  Minimal: No identifiable suicidal ideation.  Patients presenting with no risk factors but with morbid ruminations; may be classified as minimal risk based on the severity of the depressive symptoms    Plan Of Care/Follow-up recommendations:  Other:  Patient reviewed with Dr. Earlene Plater  Follow-up with established outpatient psychiatry at [place], [place]. Peer support consult ordered related to substance use/dependencies. Social work consult ordered related to homeless concerns.  Discharge medications include home medications: -Remeron 7.5 mg nightly -Seroquel 200 mg nightly  Disposition: Patient cleared by psychiatry Patrcia Dolly, FNP 04/16/2020, 9:55 AM

## 2020-04-16 NOTE — TOC Initial Note (Signed)
Transition of Care [facility]) - Initial/Assessment Note    Patient Details  Name: Kurt Foley MRN: [id number] Date of Birth: 02-May-1976  [facility]) CM/SW Contact:    Liani Caris C Tarpley-Carter, LCSWA Phone Number: 04/16/2020, 11:44 AM  Clinical Narrative:                 [facility] CM/CSW consulted with pt.  Pt stated he needed help with everything.  CSW inquired about his need.  Pt stated he needed clothes, food, and shelter.  CSW gave pt resource for shelter, [place], RN was to assist with clothing and food.  CSW also supplied pt with number for intake to [facility]).       Marjory Meints Tarpley-Carter, MSW, LCSW-A Pronouns:  She, Her, Hers                  [facility] Transitions of CareClinical Social Worker [email or web address] [phone]     Patient Goals and CMS Choice  Gain assistance for the need of clothes, food, and shelter.      Expected Discharge Plan and Services  Give pt resources for shelter and assist with food and clothing items.                                              Prior Living Arrangements/Services  Pt experiencing homelessness.                     Activities of Daily Living      Permission Sought/Granted                  Emotional Assessment  Alert and oriented x 4.            Admission diagnosis:  Mental Health Problems Patient Active Problem List   Diagnosis Date Noted  . Cocaine-induced mood disorder (HCC)   . Tinea pedis 08/06/2019  . At risk for opportunistic infections 07/14/2019  . Dehydration 06/02/2019  . Generalized weakness 06/02/2019  . Cocaine abuse (HCC) 11/25/2018  . 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 Substance induced mood disorder (HCC) 09/16/2015  . Cocaine use disorder, severe, dependence (HCC)   . Cannabis use disorder, severe, dependence (HCC) 04/09/2015  . Severe recurrent  major depression without psychotic features (HCC) 04/08/2015  . Alcohol abuse 04/08/2015  . Screening examination for venereal disease 03/25/2015  . Encounter for long-term (current) use of medications 03/25/2015  . Tobacco abuse   . Visual disturbance 11/18/2013  . Human immunodeficiency virus (HIV) disease (HCC) 10/30/2013  . Atopic dermatitis 10/30/2013   PCP:  Patient, No Pcp Per Pharmacy:   [id number] - [place], [place] - [address] AT [facility] OF [address] [place]  [address] Phone: [phone] Fax: [phone]  [facility] [phone] - [place], [place] - [phone] AT [facility] [address] & [address] [place] [address] Phone: [phone] Fax: [phone]     Social Determinants of Health (SDOH) Interventions Depression Interventions/Treatment : Referral to Psychiatry  Readmission Risk Interventions No flowsheet data found.

## 2020-04-16 NOTE — Discharge Instructions (Addendum)
For your mental health needs, you are advised to continue treatment with [facility]:       [facility]      [address], [address]      [place], [place] [address]      [phone]  If for any reason you are unable to follow up with [facility], contact [facility]:       [facility]      [address]      [place], [place] [address]      [phone]      They offer psychiatry/medication management, therapy and substance abuse treatment.  New patients are being seen in their walk-in clinic.  Walk-in hours are Monday - Thursday from 8:00 am - 11:00 am for psychiatry, and Friday from 1:00 pm - 4:00 pm for therapy.  Walk-in patients are seen on a first come, first served basis, so try to arrive as early as possible for the best chance of being seen the same day.

## 2020-04-16 NOTE — ED Triage Notes (Addendum)
Pt c/o paranoid thoughts, related to homeless status. Was seen earlier today, just discharged. Seen by peer support earlier today, no additional resources available. States he has not been on his medication in some time.

## 2020-04-16 NOTE — ED Notes (Signed)
Patient given clothing and bus pass

## 2020-04-16 NOTE — ED Provider Notes (Signed)
[facility]-EMERGENCY DEPT Provider Note   CSN: [id number] Arrival date & time: 04/16/20  1636     History Chief Complaint  Patient presents with  . Homeless  . Homicidal    Kurt Foley is a 44 y.o. male past medical history of HIV, polysubstance abuse, homelessness, presenting to the emergency department after being discharged 1 hour prior to checking back in, with homicidal ideation and hallucination.  Patient states he does not trust himself to be out in public, states he will harm someone else.  He is having auditory hallucinations of voices telling him to " do things." Does not name a particular person he may harm. Denies suicidal ideation.  During patient's ED visit last night into this morning, he had social work consultation with resources provided for shelter.  He also had peers support consultation who is unable to provide him inpatient resources due to outstanding warrants.  He was also evaluated by behavioral health twice. Additional history provided by Berneice Heinrich, NP with behavioral health over the phone.  She states patient verbally contracted for safety today.  He did not meet inpatient criteria, agreed by attending Dr.  Bronwen Betters.  The history is provided by the patient and medical records.       Past Medical History:  Diagnosis Date  . Depression   . HIV infection [facility])     Patient Active Problem List   Diagnosis Date Noted  . Cocaine-induced mood disorder (HCC)   . Tinea pedis 08/06/2019  . At risk for opportunistic infections 07/14/2019  . Dehydration 06/02/2019  . Generalized weakness 06/02/2019  . Cocaine abuse (HCC) 11/25/2018  . 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 Substance induced mood disorder (HCC) 09/16/2015  . Cocaine use disorder, severe, dependence (HCC)   . Cannabis use disorder, severe, dependence (HCC) 04/09/2015  . Severe recurrent major depression without psychotic  features (HCC) 04/08/2015  . Alcohol abuse 04/08/2015  . Screening examination for venereal disease 03/25/2015  . Encounter for long-term (current) use of medications 03/25/2015  . Tobacco abuse   . Visual disturbance 11/18/2013  . Human immunodeficiency virus (HIV) disease (HCC) 10/30/2013  . Atopic dermatitis 10/30/2013    History reviewed. No pertinent surgical history.     Family History  Problem Relation Age of Onset  . Diabetes Mother   . Hypertension Mother   . Arthritis Mother     Social History   Tobacco Use  . Smoking status: Current Every Day Smoker    Packs/day: 0.50    Years: 25.00    Pack years: 12.50    Types: Cigarettes  . Smokeless tobacco: Never Used  . Tobacco comment: cutting back  Vaping Use  . Vaping Use: Never used  Substance Use Topics  . Alcohol use: Yes    Alcohol/week: 2.0 standard drinks    Types: 2 Standard drinks or equivalent per week    Comment: 3-4 days a week.Last drink: today  . Drug use: Yes    Types: Marijuana, Cocaine    Comment: Once a week. Last used yesterday.  Cocaine last used today.     Home Medications Prior to Admission medications   Medication Sig Start Date End Date Taking? Authorizing Provider  bictegravir-emtricitabine-tenofovir AF (BIKTARVY) 50-200-25 MG TABS tablet Take 1 tablet by mouth daily. 07/14/19   [name], MD  fluconazole (DIFLUCAN) 200 MG tablet Take 1 tablet (200 mg total) by mouth daily. Patient not taking: Reported on 04/15/2020  08/06/19   [name], MD  megestrol (MEGACE ES) 625 MG/5ML suspension Take 5 mLs (625 mg total) by mouth daily. Patient not taking: Reported on 08/06/2019 07/14/19   [name], MD  mirtazapine (REMERON) 15 MG tablet Take 0.5 tablets (7.5 mg total) by mouth at bedtime. 04/16/20 05/16/20  Terald Sleeper, MD  mirtazapine (REMERON) 7.5 MG tablet Take 1 tablet (7.5 mg total) by mouth at bedtime. 07/14/19   [name], MD  QUEtiapine (SEROQUEL) 200 MG tablet Take 1  tablet (200 mg total) by mouth at bedtime. 07/14/19   [name], MD  QUEtiapine (SEROQUEL) 200 MG tablet Take 1 tablet (200 mg total) by mouth at bedtime. 04/16/20 05/16/20  Terald Sleeper, MD    Allergies    Patient has no known allergies.  Review of Systems   Review of Systems  Psychiatric/Behavioral: Positive for hallucinations.       HI  All other systems reviewed and are negative.   Physical Exam Updated Vital Signs BP 125/87 (BP Location: Right Arm)   Pulse 100   Temp 98.5 F (36.9 C) (Oral)   Resp 16   SpO2 100%   Physical Exam Vitals and nursing note reviewed.  Constitutional:      General: He is not in acute distress.    Appearance: He is well-developed.  HENT:     Head: Normocephalic and atraumatic.  Eyes:     Conjunctiva/sclera: Conjunctivae normal.  Cardiovascular:     Rate and Rhythm: Normal rate.  Pulmonary:     Effort: Pulmonary effort is normal.  Abdominal:     Palpations: Abdomen is soft.  Skin:    General: Skin is warm.  Neurological:     Mental Status: He is alert.  Psychiatric:        Speech: Speech is rapid and pressured.     Comments: Pt is somewhat agitated though cooperative at this time. Endorses HI and auditory hallucinations. Does not appear to be responding to internal stimuli.     ED Results / Procedures / Treatments   Labs (all labs ordered are listed, but only abnormal results are displayed) Labs Reviewed - No data to display  EKG None  Radiology No results found.  Procedures Procedures (including critical care time)  Medications Ordered in ED Medications - No data to display  ED Course  I have reviewed the triage vital signs and the nursing notes.  Pertinent labs & imaging results that were available during my care of the patient were reviewed by me and considered in my medical decision making (see chart for details).  Clinical Course as of Apr 16 1725  Fri Apr 16, 2020  1713 Discussed with Berneice Heinrich, NP with  behavioral health.  She evaluated patient this morning.  Discussed patient's presentation today including homicidal ideation with auditory hallucinations.  She states disposition remains that patient does not meet inpatient criteria at this time and believes patient has secondary gain.   [JR]    Clinical Course User Index [JR] Aralynn Brake, [place], PA-C   MDM Rules/Calculators/A&P                          Patient checked back into the ED an hour after discharge complaining of homicidal ideation and auditory hallucinations.  He was initially evaluated last night, cleared by psychiatry this morning.  He is also provided social work Neurosurgeon, Civil Service fast streamer, food, as he is homeless.  Peers support was consulted however is unable provide him inpatient substance abuse resources due to reported outstanding warrants per chart review.  Call was placed to Berneice Heinrich, NP with behavioral health, who evaluated patient this morning and deemed appropriate for outpatient management.  Discussed his presentation currently with homicidal ideation, auditory hallucinations.  She states disposition remains that patient does not meet inpatient criteria and does not need additional psychiatric evaluation at this time.  She states he verbally contracted for safety this morning, believes there may be some secondary gain involved.  Discussed these recommendations with patient, including outpatient resources provided.  He was provided refills of medications at this afternoon prior to discharge.  Recommend he use resources provided earlier today.  Patient discharged.  Final Clinical Impression(s) / ED Diagnoses Final diagnoses:  Homeless  Homicidal ideation    Rx / DC Orders ED Discharge Orders    None       Ericia Moxley, [place] N, PA-C 04/16/20 2340    Benjiman Core, MD 04/17/20 1451

## 2020-04-16 NOTE — ED Notes (Signed)
Patient discharged and belongings returned to patient.

## 2020-04-16 NOTE — Patient Outreach (Signed)
ED Peer Support Specialist Patient Intake (Complete at intake & 30-60 Day Follow-up)  Name: Kurt Foley  MRN: [id number]  Age: 44 y.o.   Date of Admission: 04/16/2020  Intake: Initial Comments:      Primary Reason Admitted:  Medication Refill   Foot Swelling   Paranoid   Suicidal      Lab values: Alcohol/ETOH: Positive Positive UDS? Yes Amphetamines: No Barbiturates: No Benzodiazepines: No Cocaine: Yes Opiates: No Cannabinoids: Yes  Demographic information: Gender: Male Ethnicity: African American Marital Status: Single Insurance Status: Uninsured/Self-pay Ecologist (Work Neurosurgeon, Physicist, medical, etc.: Yes Lives with: Alone Living situation: Homeless  Reported Patient History: Patient reported health conditions: Depression Patient aware of HIV and hepatitis status: No  In past year, has patient visited ED for any reason? Yes  Number of ED visits: 3  Reason(s) for visit: various reasons  In past year, has patient been hospitalized for any reason? No  Number of hospitalizations:    Reason(s) for hospitalization:    In past year, has patient been arrested? No  Number of arrests:    Reason(s) for arrest:    In past year, has patient been incarcerated? No  Number of incarcerations:    Reason(s) for incarceration:    In past year, has patient received medication-assisted treatment? No  In past year, patient received the following treatments: Other (comment)  In past year, has patient received any harm reduction services? No  Did this include any of the following?    In past year, has patient received care from a mental health provider for diagnosis other than SUD? No  In past year, is this first time patient has overdosed? No  Number of past overdoses:    In past year, is this first time patient has been hospitalized for an overdose? No  Number of hospitalizations for overdose(s):    Is patient currently  receiving treatment for a mental health diagnosis? No  Patient reports experiencing difficulty participating in SUD treatment:      Most important reason(s) for this difficulty?    Has patient received prior services for treatment? No  In past, patient has received services from following agencies:    Plan of Care:  Suggested follow up at these agencies/treatment centers:    Other information: CPSS met with Pt an was able to complete series of questions an was able to gain information to better assist Pt. CPSS discussed a few options for services an are contacting [facility] to see if they have a bed available. CPSS are waiting to hear back from facility about there decision.    Aaron Edelman Daelan Gatt, CPSS  04/16/2020 11:31 AM

## 2020-04-17 ENCOUNTER — Encounter (HOSPITAL_COMMUNITY): Payer: Self-pay | Admitting: Emergency Medicine

## 2020-04-17 ENCOUNTER — Other Ambulatory Visit: Payer: Self-pay

## 2020-04-17 LAB — CBC WITH DIFFERENTIAL/PLATELET
Abs Immature Granulocytes: 0.12 10*3/uL — ABNORMAL HIGH (ref 0.00–0.07)
Basophils Absolute: 0 10*3/uL (ref 0.0–0.1)
Basophils Relative: 1 %
Eosinophils Absolute: 0.3 10*3/uL (ref 0.0–0.5)
Eosinophils Relative: 9 %
HCT: 46.2 % (ref 39.0–52.0)
Hemoglobin: 15.3 g/dL (ref 13.0–17.0)
Immature Granulocytes: 4 %
Lymphocytes Relative: 27 %
Lymphs Abs: 0.8 10*3/uL (ref 0.7–4.0)
MCH: 31.2 pg (ref 26.0–34.0)
MCHC: 33.1 g/dL (ref 30.0–36.0)
MCV: 94.3 fL (ref 80.0–100.0)
Monocytes Absolute: 0.3 10*3/uL (ref 0.1–1.0)
Monocytes Relative: 12 %
Neutro Abs: 1.3 10*3/uL — ABNORMAL LOW (ref 1.7–7.7)
Neutrophils Relative %: 47 %
Platelets: 203 10*3/uL (ref 150–400)
RBC: 4.9 MIL/uL (ref 4.22–5.81)
RDW: 13.4 % (ref 11.5–15.5)
WBC: 2.8 10*3/uL — ABNORMAL LOW (ref 4.0–10.5)
nRBC: 0 % (ref 0.0–0.2)

## 2020-04-17 LAB — COMPREHENSIVE METABOLIC PANEL
ALT: 23 U/L (ref 0–44)
AST: 51 U/L — ABNORMAL HIGH (ref 15–41)
Albumin: 3.3 g/dL — ABNORMAL LOW (ref 3.5–5.0)
Alkaline Phosphatase: 98 U/L (ref 38–126)
Anion gap: 9 (ref 5–15)
BUN: 13 mg/dL (ref 6–20)
CO2: 23 mmol/L (ref 22–32)
Calcium: 9.5 mg/dL (ref 8.9–10.3)
Chloride: 104 mmol/L (ref 98–111)
Creatinine, Ser: 0.96 mg/dL (ref 0.61–1.24)
GFR, Estimated: >60 mL/min (ref >60)
Glucose, Bld: 110 mg/dL — ABNORMAL HIGH (ref 70–99)
Potassium: 4.4 mmol/L (ref 3.5–5.1)
Sodium: 136 mmol/L (ref 135–145)
Total Bilirubin: 0.5 mg/dL (ref 0.3–1.2)
Total Protein: 7.7 g/dL (ref 6.5–8.1)

## 2020-04-17 LAB — URINALYSIS, ROUTINE W REFLEX MICROSCOPIC
Bilirubin Urine: NEGATIVE
Glucose, UA: NEGATIVE mg/dL
Hgb urine dipstick: NEGATIVE
Ketones, ur: NEGATIVE mg/dL
Leukocytes,Ua: NEGATIVE
Nitrite: NEGATIVE
Protein, ur: NEGATIVE mg/dL
Specific Gravity, Urine: 1.018 (ref 1.005–1.030)
pH: 5 (ref 5.0–8.0)

## 2020-04-17 NOTE — ED Triage Notes (Signed)
Pt c/o lower abd pain, headache and bil foot pain x's 1 day  Also c/o vomiting

## 2020-04-17 NOTE — ED Provider Notes (Signed)
[facility] EMERGENCY DEPARTMENT Provider Note  CSN: [id number] Arrival date & time: 04/16/20 2150  Chief Complaint(s) Abdominal Pain  HPI Kurt Foley is a 44 y.o. male with a past medical history listed below who was seen twice at [facility] in the last 36 hours.  Patient was initially seen for medication refill, hallucinations, suicidal ideations.  He was cleared by behavioral health.  1 hour after being discharged he checked then again at [facility] stating that he was homicidal.  Patient was cleared at that time and discharge around 2030.  1 hour later he presented here with complaints of abdominal pain for 1 day.  On my assessment patient is unable to provide any details.  Found to be sleeping comfortably in bed.  HPI  Past Medical History Past Medical History:  Diagnosis Date  . Depression   . HIV infection [facility])    Patient Active Problem List   Diagnosis Date Noted  . Cocaine-induced mood disorder (HCC)   . Tinea pedis 08/06/2019  . At risk for opportunistic infections 07/14/2019  . Dehydration 06/02/2019  . Generalized weakness 06/02/2019  . Cocaine abuse (HCC) 11/25/2018  . 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 Substance induced mood disorder (HCC) 09/16/2015  . Cocaine use disorder, severe, dependence (HCC)   . Cannabis use disorder, severe, dependence (HCC) 04/09/2015  . Severe recurrent major depression without psychotic features (HCC) 04/08/2015  . Alcohol abuse 04/08/2015  . Screening examination for venereal disease 03/25/2015  . Encounter for long-term (current) use of medications 03/25/2015  . Tobacco abuse   . Visual disturbance 11/18/2013  . Human immunodeficiency virus (HIV) disease (HCC) 10/30/2013  . Atopic dermatitis 10/30/2013   Home Medication(s) Prior to Admission medications   Medication Sig Start Date End Date Taking? Authorizing Provider   bictegravir-emtricitabine-tenofovir AF (BIKTARVY) 50-200-25 MG TABS tablet Take 1 tablet by mouth daily. 07/14/19  Yes Comer, Belia Heman, MD  mirtazapine (REMERON) 15 MG tablet Take 0.5 tablets (7.5 mg total) by mouth at bedtime. 04/16/20 05/16/20 Yes Trifan, Kermit Balo, MD  QUEtiapine (SEROQUEL) 200 MG tablet Take 1 tablet (200 mg total) by mouth at bedtime. 07/14/19  Yes Comer, Belia Heman, MD  fluconazole (DIFLUCAN) 200 MG tablet Take 1 tablet (200 mg total) by mouth daily. Patient not taking: Reported on 04/15/2020 08/06/19   [name], MD  megestrol (MEGACE ES) 625 MG/5ML suspension Take 5 mLs (625 mg total) by mouth daily. Patient not taking: Reported on 08/06/2019 07/14/19   [name], MD  mirtazapine (REMERON) 7.5 MG tablet Take 1 tablet (7.5 mg total) by mouth at bedtime. Patient not taking: Reported on 04/17/2020 07/14/19   [name], MD  QUEtiapine (SEROQUEL) 200 MG tablet Take 1 tablet (200 mg total) by mouth at bedtime. Patient not taking: Reported on 04/17/2020 04/16/20 05/16/20  Terald Sleeper, MD  Past Surgical History History reviewed. No pertinent surgical history. Family History Family History  Problem Relation Age of Onset  . Diabetes Mother   . Hypertension Mother   . Arthritis Mother     Social History Social History   Tobacco Use  . Smoking status: Current Every Day Smoker    Packs/day: 0.50    Years: 25.00    Pack years: 12.50    Types: Cigarettes  . Smokeless tobacco: Never Used  . Tobacco comment: cutting back  Vaping Use  . Vaping Use: Never used  Substance Use Topics  . Alcohol use: Yes    Alcohol/week: 2.0 standard drinks    Types: 2 Standard drinks or equivalent per week    Comment: 3-4 days a week.Last drink: today  . Drug use: Yes    Types: Marijuana, Cocaine    Comment: Once a week. Last used yesterday.  Cocaine  last used today.    Allergies Patient has no known allergies.  Review of Systems Review of Systems All other systems are reviewed and are negative for acute change except as noted in the HPI   Physical Exam Vital Signs  I have reviewed the triage vital signs BP (!) 126/97   Pulse 94   Temp 98.2 F (36.8 C) (Oral)   Resp (!) 28   Ht 5\' 6"  (1.676 m)   Wt 54.4 kg   SpO2 99%   BMI 19.37 kg/m   Physical Exam Vitals reviewed.  Constitutional:      General: He is not in acute distress.    Appearance: He is well-developed. He is not diaphoretic.  HENT:     Head: Normocephalic and atraumatic.     Jaw: No trismus.     Right Ear: External ear normal.     Left Ear: External ear normal.     Nose: Nose normal.  Eyes:     General: No scleral icterus.    Conjunctiva/sclera: Conjunctivae normal.  Neck:     Trachea: Phonation normal.  Cardiovascular:     Rate and Rhythm: Normal rate and regular rhythm.  Pulmonary:     Effort: Pulmonary effort is normal. No respiratory distress.     Breath sounds: No stridor.  Abdominal:     General: There is no distension.     Tenderness: There is no abdominal tenderness.  Musculoskeletal:        General: Normal range of motion.     Cervical back: Normal range of motion.  Neurological:     Mental Status: He is alert and oriented to person, place, and time.  Psychiatric:        Behavior: Behavior normal.     ED Results and Treatments Labs (all labs ordered are listed, but only abnormal results are displayed) Labs Reviewed  CBC WITH DIFFERENTIAL/PLATELET - Abnormal; Notable for the following components:      Result Value   WBC 2.8 (*)    Neutro Abs 1.3 (*)    Abs Immature Granulocytes 0.12 (*)    All other components within normal limits  COMPREHENSIVE METABOLIC PANEL - Abnormal; Notable for the following components:   Glucose, Bld 110 (*)    Albumin 3.3 (*)    AST 51 (*)    All other components within normal limits  URINALYSIS,  ROUTINE W REFLEX MICROSCOPIC  EKG  EKG Interpretation  Date/Time:    Ventricular Rate:    PR Interval:    QRS Duration:   QT Interval:    QTC Calculation:   R Axis:     Text Interpretation:        Radiology No results found.  Pertinent labs & imaging results that were available during my care of the patient were reviewed by me and considered in my medical decision making (see chart for details).  Medications Ordered in ED Medications - No data to display                                                                                                                                  Procedures Procedures  (including critical care time)  Medical Decision Making / ED Course I have reviewed the nursing notes for this encounter and the patient's prior records (if available in EHR or on provided paperwork).   Kurt Foley was evaluated in Emergency Department on 04/17/2020 for the symptoms described in the history of present illness. He was evaluated in the context of the global COVID-19 pandemic, which necessitated consideration that the patient might be at risk for infection with the SARS-CoV-2 virus that causes COVID-19. Institutional protocols and algorithms that pertain to the evaluation of patients at risk for COVID-19 are in a state of rapid change based on information released by regulatory bodies including the CDC and federal and state organizations. These policies and algorithms were followed during the patient's care in the ED.  Labs drawn in triage grossly reassuring Abdomen benign. Low suspicion for serious intra-abdominal inflammatory/infectious process.  Highly suspicious for malingering due to homelessness.      Final Clinical Impression(s) / ED Diagnoses Final diagnoses:  Malingerer   The patient appears reasonably screened and/or  stabilized for discharge and I doubt any other medical condition or other [facility] requiring further screening, evaluation, or treatment in the ED at this time prior to discharge. Safe for discharge with strict return precautions.  Disposition: Discharge  Condition: Good  I have discussed the results, Dx and Tx plan with the patient/family who expressed understanding and agree(s) with the plan. Discharge instructions discussed at length. The patient/family was given strict return precautions who verbalized understanding of the instructions. No further questions at time of discharge.    ED Discharge Orders    None       Follow Up: Primary care provider  Schedule an appointment as soon as possible for a visit     Did not)   This chart was dictated using voice recognition software.  Despite best efforts to proofread,  errors can occur which can change the documentation meaning.   Nira Conn, MD 04/17/20 [phone]

## 2020-04-17 NOTE — ED Notes (Signed)
Patient arrived into room and immediately demanded snack pack and soda, patient reportedly here for abdominal pain with nausea, vomiting, and diarrhea, patient stated that he has not eaten or drank anything in several days, then retracted statement saying he has been throwing up everything he has been eating for the last 2 days. Patient demanding tv remote and for the lights to be turned off. RN informed patient that he could not have anything to eat or drink until he was seen by provider and his testing was completed.

## 2021-02-23 ENCOUNTER — Other Ambulatory Visit: Payer: Self-pay | Admitting: Internal Medicine

## 2021-02-23 NOTE — Telephone Encounter (Signed)
Appointment [phone]

## 2021-02-24 ENCOUNTER — Other Ambulatory Visit: Payer: Self-pay

## 2021-02-24 ENCOUNTER — Encounter: Payer: Self-pay | Admitting: Internal Medicine

## 2021-02-24 ENCOUNTER — Ambulatory Visit (INDEPENDENT_AMBULATORY_CARE_PROVIDER_SITE_OTHER): Payer: Self-pay | Admitting: Internal Medicine

## 2021-02-24 VITALS — BP 132/94 | HR 129 | Temp 98.7°F | Wt 105.0 lb

## 2021-02-24 DIAGNOSIS — F332 Major depressive disorder, recurrent severe without psychotic features: Secondary | ICD-10-CM

## 2021-02-24 DIAGNOSIS — B20 Human immunodeficiency virus [HIV] disease: Secondary | ICD-10-CM

## 2021-02-24 DIAGNOSIS — Z113 Encounter for screening for infections with a predominantly sexual mode of transmission: Secondary | ICD-10-CM

## 2021-02-24 DIAGNOSIS — Z79899 Other long term (current) drug therapy: Secondary | ICD-10-CM

## 2021-02-24 DIAGNOSIS — Z9189 Other specified personal risk factors, not elsewhere classified: Secondary | ICD-10-CM

## 2021-02-24 DIAGNOSIS — F1420 Cocaine dependence, uncomplicated: Secondary | ICD-10-CM

## 2021-02-24 MED ORDER — SULFAMETHOXAZOLE-TRIMETHOPRIM 800-160 MG PO TABS: 1.0000 | ORAL_TABLET | Freq: Two times a day (BID) | ORAL | 4 refills | Status: DC

## 2021-02-24 MED ORDER — BICTEGRAVIR-EMTRICITAB-TENOFOV 50-200-25 MG PO TABS: 1.0000 | ORAL_TABLET | Freq: Every day | ORAL | 11 refills | Status: DC

## 2021-02-24 MED ORDER — MIRTAZAPINE 15 MG PO TABS
7.5000 mg | ORAL_TABLET | Freq: Every day | ORAL | 1 refills | Status: DC
Start: 2021-02-24 — End: 2021-03-25

## 2021-02-24 MED ORDER — QUETIAPINE FUMARATE 200 MG PO TABS: 200.0000 mg | ORAL_TABLET | Freq: Every day | ORAL | 1 refills | Status: DC

## 2021-02-24 NOTE — Progress Notes (Signed)
   Subjective:    Patient ID: Kurt Foley, male    DOB: July 25, 1975, 45 y.o.   MRN: [id number]  HPI Here for follow up of HIV He has been out of care, off medications and homeless.  Last seen in February 2021 and CD4 then of 40 and viral load down to 161 after briefly being in care.  He is here to reestablish care.  He restarted his Biktarvy about one month ago and is currently off of crack cocaine and living with an aunt.  He feels he is in a much better place and grateful for his opportunity to get better.  He has had weight loss, diarrhea and a rash that is pruritic.  No recent labs.  He has not been on his mental health medications.    Review of Systems  Constitutional:  Positive for unexpected weight change. Negative for fatigue and fever.  Gastrointestinal:  Positive for diarrhea. Negative for nausea.  Skin:  Positive for rash.      Objective:   Physical Exam Eyes:     General: No scleral icterus. Cardiovascular:     Rate and Rhythm: Normal rate and regular rhythm.  Pulmonary:     Effort: Pulmonary effort is normal. No respiratory distress.     Breath sounds: No wheezing.  Neurological:     General: No focal deficit present.     Mental Status: He is alert.  Psychiatric:        Mood and Affect: Mood normal.        Judgment: Judgment normal.   SH: he is drug free       Assessment & Plan:

## 2021-02-25 ENCOUNTER — Telehealth: Payer: Self-pay

## 2021-02-25 ENCOUNTER — Encounter: Payer: Self-pay | Admitting: Internal Medicine

## 2021-02-25 ENCOUNTER — Other Ambulatory Visit: Payer: Self-pay | Admitting: Pharmacist

## 2021-02-25 DIAGNOSIS — B20 Human immunodeficiency virus [HIV] disease: Secondary | ICD-10-CM

## 2021-02-25 LAB — T-HELPER CELL (CD4) - (RCID CLINIC ONLY)
CD4 % Helper T Cell: 2 % — ABNORMAL LOW (ref 33–65)
CD4 T Cell Abs: <35 /uL — ABNORMAL LOW (ref 400–1790)

## 2021-02-25 MED ORDER — BICTEGRAVIR-EMTRICITAB-TENOFOV 50-200-25 MG PO TABS: 1.0000 | ORAL_TABLET | Freq: Every day | ORAL | 0 refills | Status: AC

## 2021-02-25 NOTE — Assessment & Plan Note (Signed)
Very likely to have a CD4 below 200 so will start Bactrim prophylaxis

## 2021-02-25 NOTE — Progress Notes (Signed)
Medication Samples have been provided to the patient.  Drug name: Biktarvy        Strength: 50/200/25 mg       Qty: 14 tablets (2 bottles) LOT: CHSYVB   Exp.Date: 6/23  Dosing instructions: Take one tablet by mouth once daily  The patient has been instructed regarding the correct time, dose, and frequency of taking this medication, including desired effects and most common side effects.   Margarite Gouge, PharmD, CPP Clinical Pharmacist Practitioner Infectious Diseases Clinical Pharmacist [facility]

## 2021-02-25 NOTE — Assessment & Plan Note (Signed)
I have refilled his remeron and seroquel with 1 refill and let him know that further refills will need to come from a psychiatrist.  I have referred him to psychiatry.

## 2021-02-25 NOTE — Telephone Encounter (Signed)
Patient called to state his ADAP has not been approved yet (renewed 9/15) and he cannot pick up his medication. Requesting assistance; pharmacy suggested he obtain a "trial card". Likely needs copay card; forwarding to pharmacy team for assistance.   Isadora Delorey Loyola Mast, RN

## 2021-02-25 NOTE — Assessment & Plan Note (Signed)
I congratulated him on staying off of cocaine and we discussed the importance of this.

## 2021-02-25 NOTE — Assessment & Plan Note (Signed)
Back on Biktarvy and he understands he will need to stay on this  Will check his labs today and he will return in 4 weeks to reassess.

## 2021-02-25 NOTE — Telephone Encounter (Signed)
I spoke to the patient he will come up to the clinic to pick up 2 week supply of Biktarvy until ADAP is approved

## 2021-02-26 LAB — CBC WITH DIFFERENTIAL/PLATELET
Absolute Monocytes: 204 cells/uL (ref 200–950)
Basophils Absolute: 10 cells/uL (ref 0–200)
Basophils Relative: 0.3 %
Eosinophils Absolute: 0 cells/uL — ABNORMAL LOW (ref 15–500)
Eosinophils Relative: 0 %
HCT: 33.1 % — ABNORMAL LOW (ref 38.5–50.0)
Hemoglobin: 11.2 g/dL — ABNORMAL LOW (ref 13.2–17.1)
Lymphs Abs: 364 cells/uL — ABNORMAL LOW (ref 850–3900)
MCH: 29.9 pg (ref 27.0–33.0)
MCHC: 33.8 g/dL (ref 32.0–36.0)
MCV: 88.5 fL (ref 80.0–100.0)
MPV: 9.5 fL (ref 7.5–12.5)
Monocytes Relative: 6 %
Neutro Abs: 2822 cells/uL (ref 1500–7800)
Neutrophils Relative %: 83 %
Platelets: 335 10*3/uL (ref 140–400)
RBC: 3.74 10*6/uL — ABNORMAL LOW (ref 4.20–5.80)
RDW: 12.2 % (ref 11.0–15.0)
Total Lymphocyte: 10.7 %
WBC: 3.4 10*3/uL — ABNORMAL LOW (ref 3.8–10.8)

## 2021-02-26 LAB — COMPLETE METABOLIC PANEL WITH GFR
AG Ratio: 0.9 (calc) — ABNORMAL LOW (ref 1.0–2.5)
ALT: 18 U/L (ref 9–46)
AST: 36 U/L (ref 10–40)
Albumin: 3.4 g/dL — ABNORMAL LOW (ref 3.6–5.1)
Alkaline phosphatase (APISO): 114 U/L (ref 36–130)
BUN: 8 mg/dL (ref 7–25)
CO2: 26 mmol/L (ref 20–32)
Calcium: 8.5 mg/dL — ABNORMAL LOW (ref 8.6–10.3)
Chloride: 100 mmol/L (ref 98–110)
Creat: 0.81 mg/dL (ref 0.60–1.29)
Globulin: 3.6 g/dL (calc) (ref 1.9–3.7)
Glucose, Bld: 92 mg/dL (ref 65–99)
Potassium: 3.9 mmol/L (ref 3.5–5.3)
Sodium: 134 mmol/L — ABNORMAL LOW (ref 135–146)
Total Bilirubin: 0.4 mg/dL (ref 0.2–1.2)
Total Protein: 7 g/dL (ref 6.1–8.1)
eGFR: 110 mL/min/{1.73_m2} (ref >=60)

## 2021-02-26 LAB — LIPID PANEL
Cholesterol: 91 mg/dL (ref <200)
HDL: 11 mg/dL — ABNORMAL LOW (ref >=40)
LDL Cholesterol (Calc): 54 mg/dL (calc)
Non-HDL Cholesterol (Calc): 80 mg/dL (calc) (ref <130)
Total CHOL/HDL Ratio: 8.3 (calc) — ABNORMAL HIGH (ref <5.0)
Triglycerides: 185 mg/dL — ABNORMAL HIGH (ref <150)

## 2021-02-26 LAB — RPR: RPR Ser Ql: NONREACTIVE

## 2021-02-26 LAB — HIV-1 RNA QUANT-NO REFLEX-BLD
HIV 1 RNA Quant: 1810 Copies/mL — ABNORMAL HIGH
HIV-1 RNA Quant, Log: 3.26 Log cps/mL — ABNORMAL HIGH

## 2021-02-28 ENCOUNTER — Telehealth: Payer: Self-pay

## 2021-02-28 LAB — URINE CYTOLOGY ANCILLARY ONLY
Chlamydia: NEGATIVE
Comment: NEGATIVE
Comment: NORMAL
Neisseria Gonorrhea: NEGATIVE

## 2021-02-28 NOTE — Telephone Encounter (Signed)
Patient called regarding ADAP approval. Per Olegario Messier visits are now approved but RX have not been approved yet. Patient to call back later in the week.

## 2021-03-04 ENCOUNTER — Telehealth: Payer: Self-pay | Admitting: *Deleted

## 2021-03-04 NOTE — Telephone Encounter (Signed)
Patient left message in triage stating he was finding it hard to breathe, that he can't even walk a few steps without having to take a break. He asked for an inhaler.  RN returned the call, which went to voicemail. Left message letting him know that he doesn't have an inhaler on file to refill, that he needs to be seen today if he is having difficulty breathing. RN advised him to go to the emergency room. Andree Coss, RN

## 2021-03-05 DIAGNOSIS — J439 Emphysema, unspecified: Secondary | ICD-10-CM | POA: Insufficient documentation

## 2021-03-05 DIAGNOSIS — B20 Human immunodeficiency virus [HIV] disease: Secondary | ICD-10-CM | POA: Insufficient documentation

## 2021-03-24 ENCOUNTER — Other Ambulatory Visit: Payer: Self-pay

## 2021-03-24 ENCOUNTER — Ambulatory Visit (INDEPENDENT_AMBULATORY_CARE_PROVIDER_SITE_OTHER): Payer: Self-pay | Admitting: Internal Medicine

## 2021-03-24 VITALS — BP 111/75 | HR 154 | Temp 97.8°F | Resp 16 | Ht 66.0 in | Wt 104.6 lb

## 2021-03-24 DIAGNOSIS — R21 Rash and other nonspecific skin eruption: Secondary | ICD-10-CM

## 2021-03-24 DIAGNOSIS — A312 Disseminated mycobacterium avium-intracellulare complex (DMAC): Secondary | ICD-10-CM

## 2021-03-24 DIAGNOSIS — R634 Abnormal weight loss: Secondary | ICD-10-CM

## 2021-03-24 DIAGNOSIS — Z9189 Other specified personal risk factors, not elsewhere classified: Secondary | ICD-10-CM

## 2021-03-24 DIAGNOSIS — J439 Emphysema, unspecified: Secondary | ICD-10-CM

## 2021-03-24 DIAGNOSIS — R202 Paresthesia of skin: Secondary | ICD-10-CM

## 2021-03-24 DIAGNOSIS — R509 Fever, unspecified: Secondary | ICD-10-CM

## 2021-03-24 DIAGNOSIS — R200 Anesthesia of skin: Secondary | ICD-10-CM

## 2021-03-24 DIAGNOSIS — B20 Human immunodeficiency virus [HIV] disease: Secondary | ICD-10-CM

## 2021-03-24 DIAGNOSIS — Z23 Encounter for immunization: Secondary | ICD-10-CM

## 2021-03-24 NOTE — Progress Notes (Signed)
Subjective:    Patient ID: Kurt Foley, male    DOB: 1975-07-08, 45 y.o.   MRN: [id number]  HPI Here for follow up of HIV and hospital follow up He restarted Biktarvy in August after a long abscence due to drug use.  He is living with an aunt who is helping him and has remained drug free since that time.  His most recent CD4 count was 20 and viral load down to 560 during his recent hospitalization.  He was hospitalized in [place] on 03/04/21 for significant shortness of breath and discharged on 03/21/21.  He had significant diarrhea and the GI pathogen panel was positive for norovirus, giardia, EPEC, EAEC and treated with azithromycin, metronidazole and cipro and his diarrhea had resolved.  He was significantly short of breath though never hypoxic and a CT scan notable for severe emphysema.  He was started on [facility] with a recommendation to follow with pulmonary. He was also treated for presumed pneumonia with ceftriaxone and a sputum culture positive for Klebsiella.  A nodule was also noted on his CT scan with a recommendation for 1 year monitoring.  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   He continues to have complaints of his right arm with numbness and tingling and was seen by neurology in the hospital who recommended EMG and nerve conduction studies.     He is here for follow up now. He continues to have intermittent fevers.  Diarrhea is resolved.  He has the inhalers but is running out.  He is mainly concerned about his right arm numbness and tingling and inability to move his hand much.   He is not eating much and has not gained any significant weight.     Review of Systems  Constitutional:  Positive for activity change, appetite change, fatigue, fever  and unexpected weight change.  HENT:  Negative for sore throat.   Cardiovascular:  Negative for leg swelling.  Gastrointestinal:  Negative for diarrhea and nausea.  Musculoskeletal:        Right arm numbness and tingling  Skin:  Negative for rash.  Psychiatric/Behavioral:  Negative for sleep disturbance.       Objective:   Physical Exam Constitutional:      Comments: Chronically ill-appearing  HENT:     Mouth/Throat:     Comments: Two areas of dark/purple patches without nodules on the back of the palate No thrush Neck:     Comments: + cervical lymphadenopathy bilateral, about 2 cm each side, non-tender Cardiovascular:     Rate and Rhythm: Regular rhythm. Tachycardia present.  Pulmonary:     Effort: Pulmonary effort is normal. No respiratory distress.     Breath sounds: Normal breath sounds. No wheezing.  Abdominal:     Palpations: Abdomen is soft.  Musculoskeletal:        General: No swelling.  Lymphadenopathy:     Cervical: Cervical adenopathy present.     Right cervical: Superficial cervical adenopathy present.     Left cervical: Superficial cervical adenopathy present.     Upper Body:     Right upper body: No supraclavicular or axillary adenopathy.     Left upper body: No supraclavicular or axillary adenopathy.  Skin:    Findings: No rash.  Neurological:  Mental Status: He is alert.     Comments: Right arm with decreased sensation throughout and decreased movement of right hand  Psychiatric:        Mood and Affect: Mood normal.        Thought Content: Thought content normal.    SH: no drug use      Assessment & Plan:

## 2021-03-25 ENCOUNTER — Encounter: Payer: Self-pay | Admitting: Internal Medicine

## 2021-03-25 DIAGNOSIS — R509 Fever, unspecified: Secondary | ICD-10-CM | POA: Insufficient documentation

## 2021-03-25 DIAGNOSIS — R21 Rash and other nonspecific skin eruption: Secondary | ICD-10-CM | POA: Insufficient documentation

## 2021-03-25 DIAGNOSIS — A312 Disseminated mycobacterium avium-intracellulare complex (DMAC): Secondary | ICD-10-CM | POA: Insufficient documentation

## 2021-03-25 DIAGNOSIS — Z23 Encounter for immunization: Secondary | ICD-10-CM | POA: Insufficient documentation

## 2021-03-25 DIAGNOSIS — A310 Pulmonary mycobacterial infection: Secondary | ICD-10-CM | POA: Insufficient documentation

## 2021-03-25 LAB — T-HELPER CELL (CD4) - (RCID CLINIC ONLY)
CD4 % Helper T Cell: 4 % — ABNORMAL LOW (ref 33–65)
CD4 T Cell Abs: <35 /uL — ABNORMAL LOW (ref 400–1790)

## 2021-03-25 MED ORDER — QUETIAPINE FUMARATE 200 MG PO TABS: 200.0000 mg | ORAL_TABLET | Freq: Every day | ORAL | 5 refills | Status: DC

## 2021-03-25 MED ORDER — AZITHROMYCIN 500 MG PO TABS: 500.0000 mg | ORAL_TABLET | Freq: Every day | ORAL | 5 refills | Status: DC

## 2021-03-25 MED ORDER — MIRTAZAPINE 15 MG PO TABS: 7.5000 mg | ORAL_TABLET | Freq: Every day | ORAL | 5 refills | Status: DC

## 2021-03-25 MED ORDER — UMECLIDINIUM BROMIDE 62.5 MCG/INH IN AEPB: 1.0000 | INHALATION_SPRAY | Freq: Every day | RESPIRATORY_TRACT | 5 refills | Status: DC

## 2021-03-25 MED ORDER — ETHAMBUTOL HCL 400 MG PO TABS: 800.0000 mg | ORAL_TABLET | Freq: Every day | ORAL | 5 refills | Status: DC

## 2021-03-25 MED ORDER — FLUTICASONE FUROATE-VILANTEROL 100-25 MCG/INH IN AEPB
1.0000 | INHALATION_SPRAY | Freq: Every day | RESPIRATORY_TRACT | 5 refills | Status: DC
Start: 2021-03-25 — End: 2021-07-20

## 2021-03-25 NOTE — Assessment & Plan Note (Signed)
Possible disseminated infection based on the sputum culture, though AFB blood cultures pending.  Will have him continue on ethambutol and azithromcyin for now

## 2021-03-25 NOTE — Assessment & Plan Note (Signed)
He continues on Bactrim for prophylaxis

## 2021-03-25 NOTE — Assessment & Plan Note (Signed)
At risk for monkeypox and vaccine #1 given today

## 2021-03-25 NOTE — Assessment & Plan Note (Signed)
He has ongoing fever of unknown etiology.  During his hospitalization, it was thought to be due to disseminated MAI based on a positive sputum culture though that is unclear.  AFB blood culture apparently sent and negative so far.  This certainly could be IRIS and alternatively could be related to Kaposi's sarcoma, if he has it (see other problem) with an IRIS-like reaction.  Will continue to monitor.

## 2021-03-25 NOTE — Assessment & Plan Note (Signed)
He has two areas of darkened rash/purple like area.  Differential is benign vs Kaposi's.  No nodules or other concerns noted.  Will be difficult to get a biopsy without insurance and lack of transportation but will consider an ENT referral to [facility] in [facility] for consideration of a biopsy.

## 2021-03-25 NOTE — Assessment & Plan Note (Addendum)
He continues on Berthold with no issues and I will recheck his CD4 count and viral load today.  I discussed with him he still has a long road ahead to get better.  Flu shot given today 50 minutes spent with the patient including extensive review of the recent hospitalization, literature review and 25 minutes spent with the patient in review of all the issues and plans

## 2021-03-25 NOTE — Assessment & Plan Note (Signed)
Unknown etiology. Seen by neurology during his recent hospitalization and recommended EMG/NCS.  I am first going to have him seen by sports medicine for their opinion and can consider neurology after that.

## 2021-03-25 NOTE — Assessment & Plan Note (Addendum)
Extensive emphysema noted on CT and now on inhalers with Breo Ellipta and Incruse Ellipta and these have been refilled.  Will get him to pulmonary as well next visit.  Stable at this time.  I did discuss with him the findings of severe emphysema related to tobacco use.  Unfortunately I don't see the inhalers listed on the [facility] formulary

## 2021-03-25 NOTE — Assessment & Plan Note (Signed)
I have emphasized the need to increase his caloric intake with food and Ensure

## 2021-03-26 LAB — COMPLETE METABOLIC PANEL WITH GFR
AG Ratio: 0.7 (calc) — ABNORMAL LOW (ref 1.0–2.5)
ALT: 69 U/L — ABNORMAL HIGH (ref 9–46)
AST: 113 U/L — ABNORMAL HIGH (ref 10–40)
Albumin: 3.4 g/dL — ABNORMAL LOW (ref 3.6–5.1)
Alkaline phosphatase (APISO): 331 U/L — ABNORMAL HIGH (ref 36–130)
BUN: 14 mg/dL (ref 7–25)
CO2: 22 mmol/L (ref 20–32)
Calcium: 9 mg/dL (ref 8.6–10.3)
Chloride: 99 mmol/L (ref 98–110)
Creat: 1.01 mg/dL (ref 0.60–1.29)
Globulin: 4.6 g/dL (calc) — ABNORMAL HIGH (ref 1.9–3.7)
Glucose, Bld: 76 mg/dL (ref 65–99)
Potassium: 5 mmol/L (ref 3.5–5.3)
Sodium: 130 mmol/L — ABNORMAL LOW (ref 135–146)
Total Bilirubin: 0.3 mg/dL (ref 0.2–1.2)
Total Protein: 8 g/dL (ref 6.1–8.1)
eGFR: 93 mL/min/{1.73_m2} (ref >=60)

## 2021-03-26 LAB — HIV-1 RNA QUANT-NO REFLEX-BLD
HIV 1 RNA Quant: 127 Copies/mL — ABNORMAL HIGH
HIV-1 RNA Quant, Log: 2.1 Log cps/mL — ABNORMAL HIGH

## 2021-04-06 ENCOUNTER — Telehealth: Payer: Self-pay

## 2021-04-06 NOTE — Telephone Encounter (Signed)
Patient called, states his inhaler is $400 at the pharmacy. Relayed that it is not covered by the [facility] formulary and that there are no inhalers on the formulary.   He requests that our office help him get set up with [facility] pro bono physical therapy. RN spoke to their office, the patient does not need a referral. He just needs to call and request an appointment, [facility] will reach out to our office for records if needed. Patient aware.   He is also asking about disability paperwork. Provided him with office fax number.   Sandie Ano, RN

## 2021-04-07 ENCOUNTER — Other Ambulatory Visit: Payer: Self-pay | Admitting: Internal Medicine

## 2021-04-07 ENCOUNTER — Telehealth: Payer: Self-pay

## 2021-04-07 DIAGNOSIS — J439 Emphysema, unspecified: Secondary | ICD-10-CM

## 2021-04-07 NOTE — Telephone Encounter (Signed)
Patient called wanting to check on disability claim status. Let him know that we received request and it will be processed by medical records.   Patient says he went to start physical therapy today, but that they could not begin treatment because his heart rate was in the 140s. He says he is now at the hospital and his heart rate has come down to 110, but that they found "a spot of pneumonia." Patient states he is currently still at the hospital and will follow up with Dr. Luciana Axe 11/10.  Sandie Ano, RN

## 2021-04-07 NOTE — Telephone Encounter (Signed)
Notified patient of referral.   Sandie Ano, RN

## 2021-04-13 ENCOUNTER — Ambulatory Visit (INDEPENDENT_AMBULATORY_CARE_PROVIDER_SITE_OTHER): Payer: Self-pay | Admitting: Pulmonary Disease

## 2021-04-13 ENCOUNTER — Telehealth: Payer: Self-pay | Admitting: Pulmonary Disease

## 2021-04-13 ENCOUNTER — Other Ambulatory Visit: Payer: Self-pay

## 2021-04-13 ENCOUNTER — Encounter: Payer: Self-pay | Admitting: Pulmonary Disease

## 2021-04-13 ENCOUNTER — Institutional Professional Consult (permissible substitution): Payer: Self-pay | Admitting: Pulmonary Disease

## 2021-04-13 VITALS — BP 100/64 | HR 126 | Temp 100.6°F | Ht 66.0 in | Wt 106.8 lb

## 2021-04-13 DIAGNOSIS — J432 Centrilobular emphysema: Secondary | ICD-10-CM

## 2021-04-13 NOTE — Patient Instructions (Signed)
We will look into what medications may be covered on day of program ADAPT-to see what you may be able to afford  Inhalers are still the only way to manage COPD  Continue to stay off cigarettes, avoid significant contact/exposure to cigarette smoke  I will see you back in about 3 months  We can further discuss PFTs and other testing at next visit  Order alpha-1 antitrypsin testing

## 2021-04-13 NOTE — Progress Notes (Signed)
Kurt Foley    [id number]    [birth year]  Primary Care Physician:Patient, No Pcp Per (Inactive)  Referring Physician: Thayer Headings, MD [address] [place],  [place] [address]  Chief complaint:   Patient with shortness of breath  HPI:  Shortness of breath, recently found to have multiple emphysematous changes on CT  On antiretrovirals, on medications for MAI  Was recently prescribed inhalers-Breo Ellipta and Incruse -He has not been able to procure any of them as the cost is prohibitive -Currently participate in the ADAPT program to help with the cost of medications  Quit smoking about 3 months ago Was smoking about a pack a day  Was recently hospitalized with diarrhea, hyponatremia, other electrolyte derangement Severe protein calorie malnutrition, anemia of chronic disease with sinus tachycardia Last CD4 count of 20, has disseminated MAC   no pertinent occupational history   Outpatient Encounter Medications as of 04/13/2021  Medication Sig   azithromycin (ZITHROMAX) 500 MG tablet Take 1 tablet (500 mg total) by mouth daily.   bictegravir-emtricitabine-tenofovir AF (BIKTARVY) 50-200-25 MG TABS tablet Take 1 tablet by mouth daily.   ethambutol (MYAMBUTOL) 400 MG tablet Take 2 tablets (800 mg total) by mouth daily.   fluticasone furoate-vilanterol (BREO ELLIPTA) 100-25 MCG/INH AEPB Inhale 1 puff into the lungs daily.   mirtazapine (REMERON) 15 MG tablet Take 0.5 tablets (7.5 mg total) by mouth at bedtime.   QUEtiapine (SEROQUEL) 200 MG tablet Take 1 tablet (200 mg total) by mouth at bedtime.   sulfamethoxazole-trimethoprim (BACTRIM DS) 800-160 MG tablet Take 1 tablet by mouth 2 (two) times daily.   umeclidinium bromide (INCRUSE ELLIPTA) 62.5 MCG/INH AEPB Inhale 1 puff into the lungs daily.   No facility-administered encounter medications on file as of 04/13/2021.    Allergies as of 04/13/2021   (No Known Allergies)    Past Medical History:   Diagnosis Date   Depression    HIV infection ([place])     No past surgical history on file.  Family History  Problem Relation Age of Onset   Diabetes Mother    Hypertension Mother    Arthritis Mother     Social History   Socioeconomic History   Marital status: Single    Spouse name: Not on file   Number of children: Not on file   Years of education: Not on file   Highest education level: Not on file  Occupational History   Not on file  Tobacco Use   Smoking status: Every Day    Packs/day: 0.50    Years: 25.00    Pack years: 12.50    Types: Cigarettes   Smokeless tobacco: Never   Tobacco comments:    cutting back  Vaping Use   Vaping Use: Never used  Substance and Sexual Activity   Alcohol use: Yes    Alcohol/week: 2.0 standard drinks    Types: 2 Standard drinks or equivalent per week    Comment: 3-4 days a week.Last drink: today   Drug use: Yes    Types: Marijuana, Cocaine    Comment: Once a week. Last used yesterday.  Cocaine last used today.    Sexual activity: Not Currently    Partners: Male    Comment: encouraged condom use  Other Topics Concern   Not on file  Social History Narrative   Not on file   Social Determinants of Health   Financial Resource Strain: Not on file  Food  Insecurity: Not on file  Transportation Needs: Not on file  Physical Activity: Not on file  Stress: Not on file  Social Connections: Not on file  Intimate Partner Violence: Not on file    Review of Systems  Constitutional:  Positive for fatigue.  Respiratory:  Positive for shortness of breath.    Vitals:   04/13/21 1042  BP: 100/64  Pulse: (!) 126  Temp: (!) 100.6 F (38.1 C)  SpO2: 96%     Physical Exam HENT:     Head: Normocephalic.     Mouth/Throat:     Mouth: Mucous membranes are moist.  Cardiovascular:     Rate and Rhythm: Normal rate and regular rhythm.     Heart sounds: No murmur heard. Pulmonary:     Effort: No respiratory distress.     Breath  sounds: No stridor. No wheezing or rhonchi.  Musculoskeletal:     Cervical back: No rigidity or tenderness.  Neurological:     Mental Status: He is alert.  Psychiatric:        Mood and Affect: Mood normal.  Finger clubbing noted   Data Reviewed: Hospital records from 03/04/2021 reviewed  Records from Dr.,'s office reviewed  CT report 04/07/2021 reviewed  Assessment:  Advanced chronic obstructive pulmonary disease  Immunocompromised state-on antiretrovirals  Disseminated MAC -On antibiotics  Recently quit smoking  Severe emphysema  Plan/Recommendations: We will try and look into medications that patient may be able to afford Has prescription for Breo and incruse  Encouraged to stay off cigarettes  Obtain alpha-1 antitrypsin phenotype and level  Consider PFT at next visit  Encouraged to call with any significant concerns  I spent 45 minutes dedicated to the care of this patient on the date of this encounter to include previsit review of records, face-to-face time with the patient discussing conditions above, post visit ordering of testing, clinical documentation with electronic health record and communicated necessary findings to members of the patient's care team   Sherrilyn Rist MD [facility] 04/13/2021, 11:02 AM  CC: Thayer Headings, MD

## 2021-04-13 NOTE — Telephone Encounter (Signed)
I have given the patient some patient assistance paper work. He will bring this back once he is completed.

## 2021-04-13 NOTE — Telephone Encounter (Signed)
Patient is uninsured so will need to pursue patient assistance to receive medications at no cost from manufacturer. Both Breo and Incruse have pt assistance program through [employer] but must have separate application for both submitted.  - Must have provider sign original prescription that has to be faxed with [employer] application - Patient must complete and sign his portion of forms. Link: [email or web address]  Routing back to triage/Holden Mabe CMA for f/u  Chesley Mires, PharmD, MPH, BCPS Clinical Pharmacist (Rheumatology and Pulmonology)

## 2021-04-13 NOTE — Telephone Encounter (Signed)
This patient has come in the office and he states that his Breo and incuse are over $400 a month. Do we need to have him fill out something for patient assistance.    Do you know anything about ADAP program?  Does he need to fill out [employer] forms? Please advise.

## 2021-04-13 NOTE — Telephone Encounter (Signed)
Called and spoke with pt letting him know the info stated by [employer] and he verbalized understanding. Paperwork has been printed off to be placed in the mail for pt. Stated to pt to bring paperwork back to the office once completed so we could get faxed for pt.  We will also need to print out Rx for Breo and Incruse and include with the paperwork when sent in.  Encounter was already routed to [place] by [facility] so will keep encounter open until application has been received back from pt so we can print off Rx for Incruse and Breo to fax with the application for pt.

## 2021-04-21 ENCOUNTER — Encounter: Payer: Self-pay | Admitting: Internal Medicine

## 2021-04-21 ENCOUNTER — Ambulatory Visit (INDEPENDENT_AMBULATORY_CARE_PROVIDER_SITE_OTHER): Payer: Self-pay

## 2021-04-21 ENCOUNTER — Other Ambulatory Visit: Payer: Self-pay

## 2021-04-21 ENCOUNTER — Ambulatory Visit (INDEPENDENT_AMBULATORY_CARE_PROVIDER_SITE_OTHER): Payer: Self-pay | Admitting: Internal Medicine

## 2021-04-21 VITALS — BP 101/72 | HR 118 | Resp 16 | Ht 66.0 in | Wt 106.0 lb

## 2021-04-21 DIAGNOSIS — R200 Anesthesia of skin: Secondary | ICD-10-CM

## 2021-04-21 DIAGNOSIS — Z23 Encounter for immunization: Secondary | ICD-10-CM

## 2021-04-21 DIAGNOSIS — Z9189 Other specified personal risk factors, not elsewhere classified: Secondary | ICD-10-CM

## 2021-04-21 DIAGNOSIS — R21 Rash and other nonspecific skin eruption: Secondary | ICD-10-CM

## 2021-04-21 DIAGNOSIS — J432 Centrilobular emphysema: Secondary | ICD-10-CM

## 2021-04-21 DIAGNOSIS — R634 Abnormal weight loss: Secondary | ICD-10-CM

## 2021-04-21 DIAGNOSIS — B20 Human immunodeficiency virus [HIV] disease: Secondary | ICD-10-CM

## 2021-04-21 DIAGNOSIS — R202 Paresthesia of skin: Secondary | ICD-10-CM

## 2021-04-21 NOTE — Assessment & Plan Note (Signed)
Not present currently so will continue to monitor clinically.

## 2021-04-21 NOTE — Assessment & Plan Note (Signed)
Still at risk with a low CD4 count and he will continue on Bactrim daily.

## 2021-04-21 NOTE — Assessment & Plan Note (Signed)
Improved and now with weight gain since he is on track with taking Biktarvy.  Encouraged continued eating/intake.  Will continue to monitor/weigh.

## 2021-04-21 NOTE — Progress Notes (Signed)
   Subjective:    Patient ID: Kurt Foley, male    DOB: 03/29/1976, 45 y.o.   MRN: [id number]  HPI Here for follow up of HIV He came back into care last month after a recent hospitalization for fever and found then to have a CD4 of 20 and restarted on his ARVs.  He has continued without any missed doses of Biktarvy and tolerating well.  No longer having fever and has had about a 5 lb weight gain.  Has been seeing PT for his right arm, seeing pulmonary and has paperwork to get patient assistance for his inhalers.  Overall better.     Review of Systems  Constitutional:  Positive for fatigue. Negative for chills, fever and unexpected weight change.  Gastrointestinal:  Negative for diarrhea and nausea.      Objective:   Physical Exam HENT:     Mouth/Throat:     Comments: Dark patches no longer noted Eyes:     General: No scleral icterus. Cardiovascular:     Rate and Rhythm: Normal rate and regular rhythm.  Pulmonary:     Effort: Pulmonary effort is normal. No respiratory distress.     Breath sounds: No wheezing.  Musculoskeletal:     Right lower leg: No edema.     Left lower leg: No edema.  Neurological:     General: No focal deficit present.     Mental Status: He is alert.  Psychiatric:        Mood and Affect: Mood normal.   SH; remains drug free       Assessment & Plan:

## 2021-04-21 NOTE — Assessment & Plan Note (Signed)
Improving with physical therapy and will continue

## 2021-04-22 LAB — T-HELPER CELL (CD4) - (RCID CLINIC ONLY)
CD4 % Helper T Cell: 4 % — ABNORMAL LOW (ref 33–65)
CD4 T Cell Abs: <35 /uL — ABNORMAL LOW (ref 400–1790)

## 2021-04-23 LAB — COMPLETE METABOLIC PANEL WITH GFR
AG Ratio: 0.7 (calc) — ABNORMAL LOW (ref 1.0–2.5)
ALT: 39 U/L (ref 9–46)
AST: 55 U/L — ABNORMAL HIGH (ref 10–40)
Albumin: 3.1 g/dL — ABNORMAL LOW (ref 3.6–5.1)
Alkaline phosphatase (APISO): 191 U/L — ABNORMAL HIGH (ref 36–130)
BUN: 11 mg/dL (ref 7–25)
CO2: 24 mmol/L (ref 20–32)
Calcium: 9.1 mg/dL (ref 8.6–10.3)
Chloride: 100 mmol/L (ref 98–110)
Creat: 0.91 mg/dL (ref 0.60–1.29)
Globulin: 4.2 g/dL (calc) — ABNORMAL HIGH (ref 1.9–3.7)
Glucose, Bld: 114 mg/dL — ABNORMAL HIGH (ref 65–99)
Potassium: 4.3 mmol/L (ref 3.5–5.3)
Sodium: 133 mmol/L — ABNORMAL LOW (ref 135–146)
Total Bilirubin: 0.2 mg/dL (ref 0.2–1.2)
Total Protein: 7.3 g/dL (ref 6.1–8.1)
eGFR: 105 mL/min/{1.73_m2} (ref >=60)

## 2021-04-23 LAB — HIV-1 RNA QUANT-NO REFLEX-BLD
HIV 1 RNA Quant: 56 Copies/mL — ABNORMAL HIGH
HIV-1 RNA Quant, Log: 1.75 Log cps/mL — ABNORMAL HIGH

## 2021-05-02 LAB — ALPHA-1 ANTITRYPSIN PHENOTYPE: A-1 Antitrypsin, Ser: >300 mg/dL — ABNORMAL HIGH (ref 83–199)

## 2021-05-02 NOTE — Telephone Encounter (Signed)
Signed patient form received for [employer] PAP application. Placed in Dr. Trena Platt mailbox for provider portion to be completed by triage/his CMA  Chesley Mires, PharmD, MPH, BCPS Clinical Pharmacist (Rheumatology and Pulmonology)

## 2021-05-03 MED ORDER — FLUTICASONE FUROATE-VILANTEROL 100-25 MCG/ACT IN AEPB: 1.0000 | INHALATION_SPRAY | Freq: Every day | RESPIRATORY_TRACT | 5 refills | Status: DC

## 2021-05-03 NOTE — Telephone Encounter (Signed)
Patient is going to bring a copy of his out of pocket prescription for the year to get his patient assistance filled out. I have placed the form in the cabinet until we get all the needed information.

## 2021-05-06 NOTE — Progress Notes (Signed)
Patient does not have alpha-1 antitrypsin deficiency

## 2021-05-25 ENCOUNTER — Other Ambulatory Visit: Payer: Self-pay

## 2021-05-25 ENCOUNTER — Encounter: Payer: Self-pay | Admitting: Internal Medicine

## 2021-05-25 ENCOUNTER — Ambulatory Visit (INDEPENDENT_AMBULATORY_CARE_PROVIDER_SITE_OTHER): Payer: Self-pay | Admitting: Internal Medicine

## 2021-05-25 ENCOUNTER — Telehealth: Payer: Self-pay | Admitting: Pulmonary Disease

## 2021-05-25 ENCOUNTER — Ambulatory Visit: Payer: Self-pay | Admitting: Internal Medicine

## 2021-05-25 VITALS — BP 117/92 | HR 95 | Temp 97.5°F | Ht 64.0 in | Wt 111.0 lb

## 2021-05-25 DIAGNOSIS — Z8673 Personal history of transient ischemic attack (TIA), and cerebral infarction without residual deficits: Secondary | ICD-10-CM

## 2021-05-25 DIAGNOSIS — Z9189 Other specified personal risk factors, not elsewhere classified: Secondary | ICD-10-CM

## 2021-05-25 DIAGNOSIS — A312 Disseminated mycobacterium avium-intracellulare complex (DMAC): Secondary | ICD-10-CM

## 2021-05-25 DIAGNOSIS — B20 Human immunodeficiency virus [HIV] disease: Secondary | ICD-10-CM

## 2021-05-25 MED ORDER — SULFAMETHOXAZOLE-TRIMETHOPRIM 800-160 MG PO TABS
1.0000 | ORAL_TABLET | Freq: Every day | ORAL | 11 refills | Status: DC
Start: 2021-05-25 — End: 2021-08-05

## 2021-05-25 MED ORDER — ATORVASTATIN CALCIUM 40 MG PO TABS: 40.0000 mg | ORAL_TABLET | Freq: Every day | ORAL | 11 refills | Status: DC

## 2021-05-25 MED ORDER — ASPIRIN EC 81 MG PO TBEC: 81.0000 mg | DELAYED_RELEASE_TABLET | Freq: Every day | ORAL | 11 refills | Status: DC

## 2021-05-25 MED ORDER — MEGESTROL ACETATE 625 MG/5ML PO SUSP: 625.0000 mg | Freq: Every day | ORAL | 2 refills | Status: DC

## 2021-05-25 NOTE — Assessment & Plan Note (Addendum)
He will continue with Biktarvy.  CD4 up to 100 at [facility].   45 minutes spent including 25 minutes of face to face time with discussion of the medications, extensive review of the recent hospitalization, updating medication list

## 2021-05-25 NOTE — Progress Notes (Signed)
° °  Subjective:    Patient ID: Kurt Foley, male    DOB: Jun 23, 1975, 45 y.o.   MRN: [id number]  HPI Here for follow up of his recent hospitalzation in [facility].   He previously had been poorly compliant but got back into care and has been on Biktarvy.  He though was hospitalized with fever, lymphadenopathy and MAI treatment restarted.  I had last seen him in November and MAI culture was not positive when I looked so I had stopped the medication.  However, it was positive from a 9/27 culture and he is now back on daily ethambutol and azithromycin.  He had a CT-guided biopsy of retroperitoneal lymph node and results reviewed and c/w MAI infection.  No lymphoma noted.  He feels better today, no current fevers.  He is a bit unsure of his medications.  He has had some right arm numbness as well since September and he had an MRI while hospitalized and c/w a previous CVA on the left side.  He was started on atorvastatin and aspirin. He though is not taking that and was unaware if he should.  Also told to talk to me about Bactrim.    Review of Systems  Constitutional:  Negative for fatigue and fever.  Gastrointestinal:  Negative for diarrhea and nausea.  Skin:  Negative for rash.      Objective:   Physical Exam Eyes:     General: No scleral icterus. Pulmonary:     Effort: Pulmonary effort is normal.  Abdominal:     Palpations: Abdomen is soft.  Neurological:     General: No focal deficit present.     Mental Status: He is alert.  Psychiatric:        Mood and Affect: Mood normal.   SH: no tobacco now       Assessment & Plan:

## 2021-05-25 NOTE — Assessment & Plan Note (Signed)
MRI noted from outside hospital and c/w previous stroke and c/w his current symptoms.  Now on aspirin and atorvastatin and I have prescribed this.  He is to get PT.  No longer smoking.

## 2021-05-25 NOTE — Assessment & Plan Note (Signed)
Now confirmed as disseminated infection and will continue with ethambutol and azithromycin daily for a prolonged period.  Lymph node biopsy also noted and c/w the same.  No lymphoma found.

## 2021-05-26 LAB — T-HELPER CELL (CD4) - (RCID CLINIC ONLY)
CD4 % Helper T Cell: 4 % — ABNORMAL LOW (ref 33–65)
CD4 T Cell Abs: <35 /uL — ABNORMAL LOW (ref 400–1790)

## 2021-05-26 NOTE — Telephone Encounter (Signed)
Will forward to [place] to look in to. I believe this is regarding pt assistance per previous phone notes.

## 2021-05-27 NOTE — Telephone Encounter (Signed)
I will check and get this faxed to the patient assistance. Nothing further needed.

## 2021-05-29 LAB — HIV-1 RNA QUANT-NO REFLEX-BLD
HIV 1 RNA Quant: 50 Copies/mL — ABNORMAL HIGH
HIV-1 RNA Quant, Log: 1.7 Log cps/mL — ABNORMAL HIGH

## 2021-06-23 ENCOUNTER — Ambulatory Visit: Payer: Self-pay | Admitting: Internal Medicine

## 2021-06-28 ENCOUNTER — Ambulatory Visit: Payer: Self-pay | Admitting: Pulmonary Disease

## 2021-07-05 ENCOUNTER — Ambulatory Visit: Payer: Self-pay | Admitting: Family Medicine

## 2021-07-08 ENCOUNTER — Encounter: Payer: Self-pay | Admitting: Internal Medicine

## 2021-07-08 ENCOUNTER — Other Ambulatory Visit: Payer: Self-pay

## 2021-07-08 ENCOUNTER — Ambulatory Visit (INDEPENDENT_AMBULATORY_CARE_PROVIDER_SITE_OTHER): Payer: Self-pay | Admitting: Internal Medicine

## 2021-07-08 VITALS — BP 113/83 | HR 132 | Temp 98.3°F | Ht 65.0 in | Wt 114.0 lb

## 2021-07-08 DIAGNOSIS — F1494 Cocaine use, unspecified with cocaine-induced mood disorder: Secondary | ICD-10-CM

## 2021-07-08 DIAGNOSIS — A812 Progressive multifocal leukoencephalopathy: Secondary | ICD-10-CM

## 2021-07-08 DIAGNOSIS — A312 Disseminated mycobacterium avium-intracellulare complex (DMAC): Secondary | ICD-10-CM

## 2021-07-08 DIAGNOSIS — B20 Human immunodeficiency virus [HIV] disease: Secondary | ICD-10-CM

## 2021-07-08 DIAGNOSIS — Z9189 Other specified personal risk factors, not elsewhere classified: Secondary | ICD-10-CM

## 2021-07-08 DIAGNOSIS — I639 Cerebral infarction, unspecified: Secondary | ICD-10-CM

## 2021-07-08 NOTE — Progress Notes (Signed)
° °  Subjective:    Patient ID: Kurt Foley, male    DOB: [birth year], 46 y.o.   MRN: [id number]  HPI Here for follow up of HIV and MAI infection and now with PML He came back in to care in September 2022 helped by his aunt who he has been living with.  He was hospitalized in [facility] with suspicion of MAI infection and later confirmed to be disseminated MAI and has been on azithromycin and ethambutol.  He has been taking Biktarvy for his HIV.  Unfortunately, he was again recently hospitalized in [facility] with impaired gait and MRI on 06/15/21 with confluent FLAIR signal abnormality concerning for opportunistic infection/process and underwent brain biopsy at [facility] with pathology c/w progressive multifocal leukoencephalopathy.  He continues on Biktarvy, Bactrim for OI prophylaxis and MAI treatment without issues.  He is appropriately depressed regarding his diagnosis and is aware of the significance of PML.       Review of Systems  Constitutional:  Negative for fatigue and unexpected weight change.  Gastrointestinal:  Negative for diarrhea and nausea.  Skin:  Negative for rash.      Objective:   Physical Exam Eyes:     General: No scleral icterus. Pulmonary:     Effort: Pulmonary effort is normal.  Skin:    Findings: No rash.  Neurological:     General: No focal deficit present.     Mental Status: He is alert.   SH: no current tobacco       Assessment & Plan:

## 2021-07-11 LAB — HIV-1 RNA QUANT-NO REFLEX-BLD
HIV 1 RNA Quant: <20 Copies/mL — ABNORMAL HIGH
HIV-1 RNA Quant, Log: <1.3 Log cps/mL — ABNORMAL HIGH

## 2021-07-11 LAB — COMPLETE METABOLIC PANEL WITH GFR
AG Ratio: 0.9 (calc) — ABNORMAL LOW (ref 1.0–2.5)
ALT: 89 U/L — ABNORMAL HIGH (ref 9–46)
AST: 71 U/L — ABNORMAL HIGH (ref 10–40)
Albumin: 4 g/dL (ref 3.6–5.1)
Alkaline phosphatase (APISO): 275 U/L — ABNORMAL HIGH (ref 36–130)
BUN: 13 mg/dL (ref 7–25)
CO2: 18 mmol/L — ABNORMAL LOW (ref 20–32)
Calcium: 9.9 mg/dL (ref 8.6–10.3)
Chloride: 101 mmol/L (ref 98–110)
Creat: 1 mg/dL (ref 0.60–1.29)
Globulin: 4.6 g/dL (calc) — ABNORMAL HIGH (ref 1.9–3.7)
Glucose, Bld: 83 mg/dL (ref 65–99)
Potassium: 4.4 mmol/L (ref 3.5–5.3)
Sodium: 132 mmol/L — ABNORMAL LOW (ref 135–146)
Total Bilirubin: 0.2 mg/dL (ref 0.2–1.2)
Total Protein: 8.6 g/dL — ABNORMAL HIGH (ref 6.1–8.1)
eGFR: 93 mL/min/{1.73_m2} (ref >=60)

## 2021-07-11 LAB — T-HELPER CELLS (CD4) COUNT (NOT AT ARMC)
Absolute CD4: 37 cells/uL — ABNORMAL LOW (ref 490–1740)
CD4 T Helper %: 6 % — ABNORMAL LOW (ref 30–61)
Total lymphocyte count: 625 cells/uL — ABNORMAL LOW (ref 850–3900)

## 2021-07-13 ENCOUNTER — Encounter (HOSPITAL_COMMUNITY): Payer: Self-pay

## 2021-07-13 ENCOUNTER — Inpatient Hospital Stay (HOSPITAL_COMMUNITY)
Admission: EM | Admit: 2021-07-13 | Discharge: 2021-07-20 | DRG: 641 | Disposition: A | Discharge status: Rehabilitation Facility | Payer: Medicaid Other | Attending: Internal Medicine | Admitting: Internal Medicine

## 2021-07-13 ENCOUNTER — Encounter: Payer: Self-pay | Admitting: Internal Medicine

## 2021-07-13 ENCOUNTER — Other Ambulatory Visit: Payer: Self-pay

## 2021-07-13 ENCOUNTER — Observation Stay (HOSPITAL_COMMUNITY): Payer: Medicaid Other

## 2021-07-13 ENCOUNTER — Emergency Department (HOSPITAL_COMMUNITY): Payer: Medicaid Other

## 2021-07-13 DIAGNOSIS — Z79899 Other long term (current) drug therapy: Secondary | ICD-10-CM

## 2021-07-13 DIAGNOSIS — Z8673 Personal history of transient ischemic attack (TIA), and cerebral infarction without residual deficits: Secondary | ICD-10-CM

## 2021-07-13 DIAGNOSIS — E440 Moderate protein-calorie malnutrition: Secondary | ICD-10-CM | POA: Diagnosis present

## 2021-07-13 DIAGNOSIS — D72810 Lymphocytopenia: Secondary | ICD-10-CM | POA: Diagnosis present

## 2021-07-13 DIAGNOSIS — E876 Hypokalemia: Secondary | ICD-10-CM | POA: Diagnosis present

## 2021-07-13 DIAGNOSIS — Z681 Body mass index (BMI) 19 or less, adult: Secondary | ICD-10-CM

## 2021-07-13 DIAGNOSIS — B20 Human immunodeficiency virus [HIV] disease: Secondary | ICD-10-CM | POA: Diagnosis present

## 2021-07-13 DIAGNOSIS — A812 Progressive multifocal leukoencephalopathy: Secondary | ICD-10-CM | POA: Diagnosis not present

## 2021-07-13 DIAGNOSIS — A310 Pulmonary mycobacterial infection: Secondary | ICD-10-CM | POA: Diagnosis present

## 2021-07-13 DIAGNOSIS — E871 Hypo-osmolality and hyponatremia: Secondary | ICD-10-CM | POA: Diagnosis present

## 2021-07-13 DIAGNOSIS — I69351 Hemiplegia and hemiparesis following cerebral infarction affecting right dominant side: Secondary | ICD-10-CM

## 2021-07-13 DIAGNOSIS — R5381 Other malaise: Secondary | ICD-10-CM | POA: Diagnosis present

## 2021-07-13 DIAGNOSIS — R509 Fever, unspecified: Secondary | ICD-10-CM | POA: Diagnosis not present

## 2021-07-13 DIAGNOSIS — R4781 Slurred speech: Secondary | ICD-10-CM | POA: Diagnosis present

## 2021-07-13 DIAGNOSIS — Z8261 Family history of arthritis: Secondary | ICD-10-CM

## 2021-07-13 DIAGNOSIS — R000 Tachycardia, unspecified: Secondary | ICD-10-CM | POA: Diagnosis not present

## 2021-07-13 DIAGNOSIS — H539 Unspecified visual disturbance: Secondary | ICD-10-CM | POA: Diagnosis present

## 2021-07-13 DIAGNOSIS — Z515 Encounter for palliative care: Secondary | ICD-10-CM

## 2021-07-13 DIAGNOSIS — I639 Cerebral infarction, unspecified: Secondary | ICD-10-CM | POA: Diagnosis present

## 2021-07-13 DIAGNOSIS — E860 Dehydration: Secondary | ICD-10-CM | POA: Diagnosis present

## 2021-07-13 DIAGNOSIS — R7989 Other specified abnormal findings of blood chemistry: Secondary | ICD-10-CM | POA: Diagnosis present

## 2021-07-13 DIAGNOSIS — Z833 Family history of diabetes mellitus: Secondary | ICD-10-CM

## 2021-07-13 DIAGNOSIS — Z7982 Long term (current) use of aspirin: Secondary | ICD-10-CM

## 2021-07-13 DIAGNOSIS — A312 Disseminated mycobacterium avium-intracellulare complex (DMAC): Secondary | ICD-10-CM | POA: Diagnosis present

## 2021-07-13 DIAGNOSIS — Z8249 Family history of ischemic heart disease and other diseases of the circulatory system: Secondary | ICD-10-CM

## 2021-07-13 DIAGNOSIS — R627 Adult failure to thrive: Secondary | ICD-10-CM | POA: Diagnosis not present

## 2021-07-13 DIAGNOSIS — Z87891 Personal history of nicotine dependence: Secondary | ICD-10-CM

## 2021-07-13 DIAGNOSIS — Z20822 Contact with and (suspected) exposure to covid-19: Secondary | ICD-10-CM | POA: Diagnosis present

## 2021-07-13 DIAGNOSIS — I69322 Dysarthria following cerebral infarction: Secondary | ICD-10-CM

## 2021-07-13 LAB — CBC WITH DIFFERENTIAL/PLATELET
Abs Immature Granulocytes: 0.08 10*3/uL — ABNORMAL HIGH (ref 0.00–0.07)
Basophils Absolute: 0.1 10*3/uL (ref 0.0–0.1)
Basophils Relative: 1 %
Eosinophils Absolute: 0.1 10*3/uL (ref 0.0–0.5)
Eosinophils Relative: 1 %
HCT: 31.8 % — ABNORMAL LOW (ref 39.0–52.0)
Hemoglobin: 10.2 g/dL — ABNORMAL LOW (ref 13.0–17.0)
Immature Granulocytes: 2 %
Lymphocytes Relative: 13 %
Lymphs Abs: 0.5 10*3/uL — ABNORMAL LOW (ref 0.7–4.0)
MCH: 28.3 pg (ref 26.0–34.0)
MCHC: 32.1 g/dL (ref 30.0–36.0)
MCV: 88.3 fL (ref 80.0–100.0)
Monocytes Absolute: 0.3 10*3/uL (ref 0.1–1.0)
Monocytes Relative: 8 %
Neutro Abs: 3.2 10*3/uL (ref 1.7–7.7)
Neutrophils Relative %: 75 %
Platelets: 542 10*3/uL — ABNORMAL HIGH (ref 150–400)
RBC: 3.6 MIL/uL — ABNORMAL LOW (ref 4.22–5.81)
RDW: 16.8 % — ABNORMAL HIGH (ref 11.5–15.5)
WBC: 4.3 10*3/uL (ref 4.0–10.5)
nRBC: 0 % (ref 0.0–0.2)

## 2021-07-13 LAB — COMPREHENSIVE METABOLIC PANEL
ALT: 91 U/L — ABNORMAL HIGH (ref 0–44)
AST: 80 U/L — ABNORMAL HIGH (ref 15–41)
Albumin: 4 g/dL (ref 3.5–5.0)
Alkaline Phosphatase: 244 U/L — ABNORMAL HIGH (ref 38–126)
Anion gap: 9 (ref 5–15)
BUN: 14 mg/dL (ref 6–20)
CO2: 19 mmol/L — ABNORMAL LOW (ref 22–32)
Calcium: 10.1 mg/dL (ref 8.9–10.3)
Chloride: 102 mmol/L (ref 98–111)
Creatinine, Ser: 1.05 mg/dL (ref 0.61–1.24)
GFR, Estimated: >60 mL/min (ref >60)
Glucose, Bld: 97 mg/dL (ref 70–99)
Potassium: 4.7 mmol/L (ref 3.5–5.1)
Sodium: 130 mmol/L — ABNORMAL LOW (ref 135–145)
Total Bilirubin: 0.4 mg/dL (ref 0.3–1.2)
Total Protein: 10.1 g/dL — ABNORMAL HIGH (ref 6.5–8.1)

## 2021-07-13 LAB — PROTEIN, CSF: Total  Protein, CSF: 32 mg/dL (ref 15–45)

## 2021-07-13 LAB — CSF CELL COUNT WITH DIFFERENTIAL
Other Cells, CSF: UNDETERMINED
Other Cells, CSF: UNDETERMINED
RBC Count, CSF: 22 /mm3 — ABNORMAL HIGH
RBC Count, CSF: 620 /mm3 — ABNORMAL HIGH
Tube #: 1
Tube #: 4
WBC, CSF: 0 /mm3 (ref 0–5)
WBC, CSF: 0 /mm3 (ref 0–5)

## 2021-07-13 LAB — RESP PANEL BY RT-PCR (FLU A&B, COVID) ARPGX2
Influenza A by PCR: NEGATIVE
Influenza B by PCR: NEGATIVE
SARS Coronavirus 2 by RT PCR: NEGATIVE

## 2021-07-13 LAB — ETHANOL: Alcohol, Ethyl (B): <10 mg/dL (ref <10)

## 2021-07-13 LAB — GLUCOSE, CSF: Glucose, CSF: 49 mg/dL (ref 40–70)

## 2021-07-13 LAB — TSH: TSH: 2.649 u[IU]/mL (ref 0.350–4.500)

## 2021-07-13 MED ORDER — SULFAMETHOXAZOLE-TRIMETHOPRIM 800-160 MG PO TABS
1.0000 | ORAL_TABLET | Freq: Every day | ORAL | Status: DC
  Administered 2021-07-14 – 2021-07-20 (×7): 1 via ORAL
  Filled 2021-07-13 (×8): qty 1

## 2021-07-13 MED ORDER — AZITHROMYCIN 250 MG PO TABS
500.0000 mg | ORAL_TABLET | Freq: Every day | ORAL | Status: DC
  Administered 2021-07-13 – 2021-07-19 (×7): 500 mg via ORAL
  Filled 2021-07-13 (×7): qty 2

## 2021-07-13 MED ORDER — QUETIAPINE FUMARATE 100 MG PO TABS
200.0000 mg | ORAL_TABLET | Freq: Every day | ORAL | Status: DC
  Administered 2021-07-13 – 2021-07-19 (×5): 200 mg via ORAL
  Filled 2021-07-13 (×7): qty 2

## 2021-07-13 MED ORDER — ENOXAPARIN SODIUM 40 MG/0.4ML IJ SOSY
40.0000 mg | PREFILLED_SYRINGE | INTRAMUSCULAR | Status: DC
  Administered 2021-07-14 – 2021-07-20 (×7): 40 mg via SUBCUTANEOUS
  Filled 2021-07-13 (×7): qty 0.4

## 2021-07-13 MED ORDER — LORAZEPAM 2 MG/ML IJ SOLN
0.5000 mg | Freq: Once | INTRAMUSCULAR | Status: AC
  Administered 2021-07-14: 0.5 mg via INTRAVENOUS
  Filled 2021-07-13 (×2): qty 1

## 2021-07-13 MED ORDER — ETHAMBUTOL HCL 400 MG PO TABS
400.0000 mg | ORAL_TABLET | Freq: Every day | ORAL | Status: DC
  Administered 2021-07-14 – 2021-07-20 (×7): 400 mg via ORAL
  Filled 2021-07-13 (×8): qty 1

## 2021-07-13 MED ORDER — LIDOCAINE HCL 2 % IJ SOLN
10.0000 mL | Freq: Once | INTRAMUSCULAR | Status: AC
Start: 2021-07-13 — End: 2021-07-13
  Administered 2021-07-13: 20 mg
  Filled 2021-07-13: qty 20

## 2021-07-13 MED ORDER — OXYCODONE HCL 5 MG PO TABS
5.0000 mg | ORAL_TABLET | Freq: Four times a day (QID) | ORAL | Status: DC | PRN
  Administered 2021-07-13 – 2021-07-15 (×4): 5 mg via ORAL
  Filled 2021-07-13 (×4): qty 1

## 2021-07-13 MED ORDER — BICTEGRAVIR-EMTRICITAB-TENOFOV 50-200-25 MG PO TABS
1.0000 | ORAL_TABLET | Freq: Every day | ORAL | Status: DC
  Administered 2021-07-14 – 2021-07-20 (×7): 1 via ORAL
  Filled 2021-07-13 (×8): qty 1

## 2021-07-13 MED ORDER — GADOBUTROL 1 MMOL/ML IV SOLN
6.0000 mL | Freq: Once | INTRAVENOUS | Status: AC | PRN
  Administered 2021-07-13: 6 mL via INTRAVENOUS

## 2021-07-13 MED ORDER — FLUTICASONE FUROATE-VILANTEROL 100-25 MCG/ACT IN AEPB
1.0000 | INHALATION_SPRAY | Freq: Every day | RESPIRATORY_TRACT | Status: DC
  Administered 2021-07-14 – 2021-07-19 (×4): 1 via RESPIRATORY_TRACT
  Filled 2021-07-13: qty 28

## 2021-07-13 MED ORDER — ATORVASTATIN CALCIUM 40 MG PO TABS: 40.0000 mg | ORAL_TABLET | Freq: Every day | ORAL | Status: DC

## 2021-07-13 MED ORDER — MIRTAZAPINE 15 MG PO TABS
7.5000 mg | ORAL_TABLET | Freq: Every day | ORAL | Status: DC
  Administered 2021-07-13 – 2021-07-19 (×6): 7.5 mg via ORAL
  Filled 2021-07-13 (×7): qty 1

## 2021-07-13 MED ORDER — SODIUM CHLORIDE 0.9 % IV BOLUS
1000.0000 mL | Freq: Once | INTRAVENOUS | Status: AC
  Administered 2021-07-13: 1000 mL via INTRAVENOUS

## 2021-07-13 MED ORDER — SODIUM CHLORIDE 0.9 % IV SOLN: INTRAVENOUS | Status: AC

## 2021-07-13 NOTE — Assessment & Plan Note (Addendum)
He is on Biktarvy and will continue with this.  Labs checked at the appt and CD4 up to 37.    I have personally spent 45 minutes involved in face-to-face and non-face-to-face activities for this patient on the day of the visit. Professional time spent includes the following activities: Preparing to see the patient (review of tests), Obtaining and/or reviewing separately obtained history (admission/discharge record), Performing a medically appropriate examination and/or evaluation , Ordering medications/tests/procedures, referring and communicating with other health care professionals, Documenting clinical information in the EMR, Independently interpreting results (not separately reported), Communicating results to the patient/family/caregiver, Counseling and educating the patient/family/caregiver and Care coordination (not separately reported).

## 2021-07-13 NOTE — Assessment & Plan Note (Signed)
He is feeling better, no fevers, no new concerns.  He will continue with azithromycin and ethambutol.

## 2021-07-13 NOTE — ED Provider Notes (Signed)
°  Physical Exam  BP 105/87    Pulse (!) 102    Temp 98.5 F (36.9 C) (Oral)    Resp 16    SpO2 100%   Physical Exam  Procedures  .Lumbar Puncture  Date/Time: 07/13/2021 4:17 PM Performed by: Charlynne Pander, MD Authorized by: Charlynne Pander, MD   Consent:    Consent obtained:  Verbal and written   Consent given by:  Patient   Risks, benefits, and alternatives were discussed: yes     Risks discussed:  Bleeding Universal protocol:    Procedure explained and questions answered to patient or proxy's satisfaction: yes     Patient identity confirmed:  Verbally with patient Pre-procedure details:    Procedure purpose:  Diagnostic   Preparation: Patient was prepped and draped in usual sterile fashion   Anesthesia:    Anesthesia method:  Local infiltration   Local anesthetic:  Lidocaine 2% w/o epi Procedure details:    Lumbar space:  L4-L5 interspace   Patient position:  R lateral decubitus   Needle gauge:  20   Needle type:  Diamond point   Needle length (in):  3.5   Ultrasound guidance: no     Number of attempts:  2   Opening pressure (cm H2O):  10   Fluid appearance:  Clear   Tubes of fluid:  4   Total volume (ml):  6 Post-procedure details:    Puncture site:  Adhesive bandage applied   Procedure completion:  Tolerated  ED Course / MDM    Medical Decision Making Infectious disease requested that we perform LP to rule out cryptococcus.  Perform LP successfully.  Sample sent to lab.  Hospitalist to follow-up results   Amount and/or Complexity of Data Reviewed Labs: ordered. Radiology: ordered.  Risk Prescription drug management. Decision regarding hospitalization.        Charlynne Pander, MD 07/13/21 [phone]

## 2021-07-13 NOTE — Assessment & Plan Note (Signed)
This is a new diagnosis for him recently and I discussed the overall prognosis, slow progression and benefit with taking Biktarvy and trying to reestablish his immune system.  I discussed it can continue to progress.   He continues to have motor deficits and will try to get him some physical therapy, though without insurance it is challenging.

## 2021-07-13 NOTE — H&P (Addendum)
History and Physical    Patient: Kurt Foley DOB: [birth year] DOA: 07/13/2021 DOS: the patient was seen and examined on 07/13/2021 PCP: Patient, No Pcp Per (Inactive)  Patient coming from: Home  Chief Complaint: Weakness, failure to thrive Chief Complaint  Patient presents with   Leg Pain    HPI: Kurt Foley is a 46 y.o. 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  Blood pressure 100/79, pulse (!) 102, temperature 98.5 F (36.9 C), temperature source Oral, resp. rate 14, SpO2 100 %. CBC stable close to baseline with lymphopenia, chronic hyponatremia sodium 130 close to baseline, chronic mild elevation of LFT EDP discussed with ID who recommended ophthalmology and palliative care consult Admitted for FTT, goals of care discussion  Review of Systems: As mentioned in the history of present illness. All other systems reviewed and are negative. Past Medical History:  Diagnosis Date   Depression    HIV infection ([place])    History reviewed. No pertinent surgical history. Social History:  reports that he has quit smoking. His smoking use included cigarettes. He has a 12.50 pack-year smoking history. He has never used smokeless tobacco. He reports that he does not currently use alcohol after a past usage of about 2.0 standard drinks per week. He reports that he does not currently use drugs after having used the following drugs: Marijuana and Cocaine.  No Known Allergies  Family History  Problem Relation Age of Onset   Diabetes Mother    Hypertension Mother    Arthritis Mother     Prior to Admission medications   Medication Sig Start Date End  Date Taking? Authorizing Provider  aspirin EC 81 MG tablet Take 1 tablet (81 mg total) by mouth daily. Swallow whole. 05/25/21   Thayer Headings, MD  atorvastatin (LIPITOR) 40 MG tablet Take 1 tablet (40 mg total) by mouth daily. 05/25/21   Comer, Okey Regal, MD  azithromycin (ZITHROMAX) 500 MG tablet Take 500 mg by mouth daily. 04/26/21   [provider]  bictegravir-emtricitabine-tenofovir AF (BIKTARVY) 50-200-25 MG TABS tablet Take 1 tablet by mouth daily. 02/24/21   Thayer Headings, MD  ethambutol (MYAMBUTOL) 400 MG tablet Take by mouth. 05/20/21   [provider]  fluticasone furoate-vilanterol (BREO ELLIPTA) 100-25 MCG/ACT AEPB Inhale 1 puff into the lungs daily. 05/03/21   Olalere, Cicero Duck A, MD  fluticasone furoate-vilanterol (BREO ELLIPTA) 100-25 MCG/INH AEPB Inhale 1 puff into the lungs daily. Patient not taking: Reported on 04/21/2021 03/25/21   Thayer Headings, MD  megestrol (MEGACE ES) 625 MG/5ML suspension Take 5 mLs (625 mg total) by mouth daily. 05/25/21   Thayer Headings, MD  mirtazapine (REMERON) 15 MG tablet Take 0.5 tablets (7.5 mg total) by mouth at bedtime. 03/25/21   Thayer Headings, MD  Multiple Vitamin Artis Flock) TABS Take 1 tablet by mouth daily. 05/20/21   [provider]  QUEtiapine (SEROQUEL) 200 MG tablet Take 1 tablet (200 mg total) by mouth at bedtime. 03/25/21   Comer, Okey Regal, MD  sulfamethoxazole-trimethoprim (BACTRIM DS) 800-160 MG tablet Take 1 tablet by mouth daily. 05/25/21   Comer, Okey Regal, MD  umeclidinium bromide (INCRUSE ELLIPTA) 62.5  MCG/INH AEPB Inhale 1 puff into the lungs daily. Patient not taking: Reported on 04/21/2021 03/25/21   Thayer Headings, MD    Physical Exam: Vitals:   07/13/21 1600 07/13/21 1715 07/13/21 1804 07/13/21 1843  BP: 105/87 101/82 108/88 102/87  Pulse: (!) 102 96 95 99  Resp: _0 Temp:   98.1 F (36.7 C) 98.2 F (36.8 C)  TempSrc:   Oral Oral  SpO2: 100% 100% 100% 100%   General:  Chronically ill-appearing , no frail, weak , AAOx3 NAD Cardiovascular: RRR Respiratory: CTABL Abdomen: Soft/ND/NT, positive BS Musculoskeletal: No Edema Neuro: alert, oriented    Data Reviewed:  CBC stable close to baseline with lymphopenia, chronic hyponatremia sodium 130 close to baseline, chronic mild elevation of LFT   Assessment and Plan: No notes have been filed under this hospital service. Service: Hospitalist  HIV/ AIDS/PML/disseminated MAC CD4 less than 35 ID consulted recommend continuing Biktarvy, Bactrim for P JP prophylaxis, continue azithromycin/ethambutol for disseminated MAC Mri brain Status post LP in the ED, follow-up on test result Ophthalmology consult Palliative care consult  History of CVA with right hemiparesis Hold aspirin for now, just received LP Hold statin due to elevation of LFT  Hyponatremia Reported poor appetite, appears dehydrated Start gentle hydration  FTT, poor prognosis Palliative care consulted   Advance Care Planning:   Code Status: Partial Code yes to CPR, no intubation  Consults:  ID Ophthalmology Palliative care  Family Communication: None at bedside  Severity of Illness: Observation  Author: Florencia Reasons, MD PhD FACP 07/13/2021 8:24 PM  For on call review [email or web address].

## 2021-07-13 NOTE — Assessment & Plan Note (Signed)
He continues to remain drug free °

## 2021-07-13 NOTE — ED Triage Notes (Signed)
Pt arrived via EMS, from home, worsening right leg pain since biopsy 1/13.  Recent stroke in December, right sided paralysis.

## 2021-07-13 NOTE — ED Provider Triage Note (Signed)
Emergency Medicine Provider Triage Evaluation Note  Kurt Foley , a 46 y.o. male  was evaluated in triage.  Pt complains of worsening speech, worsening weakness and balance. Per [facility] note:  Kurt Foley is an 46 y.o. 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   He had brain biopsy recently and was diagnosed with PML.  Review of Systems  Positive: Poor balance, slurred speech Negative: Chest pain  Physical Exam  BP 100/79 (BP Location: Right Arm)    Pulse (!) 102    Temp 98.5 F (36.9 C) (Oral)    Resp 14    SpO2 100%  Gen:   Awake, no distress   Resp:  Normal effort  MSK:   Moves extremities without difficulty  Other:  Slurred speech  Medical Decision Making  Medically screening exam initiated at 11:18 AM.  Appropriate orders placed.  Kurt Foley was informed that the remainder of the evaluation will be completed by another provider, this initial triage assessment does not replace that evaluation, and the importance of remaining in the ED until their evaluation is complete.    Derwood Kaplan, MD 07/13/21 1119

## 2021-07-13 NOTE — Consult Note (Signed)
Chief Complaint/Reason for Consultation:   HPI:  46 yo M PMH HIV/AIDS with CD4<35, CVA, disseminated MAC, PML s/p recent brain bx at [facility]. Patient states he see's 3-4 small black floaters, unsure of which eye and unsure of exact onset. He denies any blurry vision in either eye. Denies flashes, curtain over vision, or any missing spots in his visual field. He states he does not wear any glasses or contacts and denies any significant ocular history.    ROS: weakness, slurred speech, otherwise as in HPI  No current facility-administered medications on file prior to encounter.   Current Outpatient Medications on File Prior to Encounter  Medication Sig Dispense Refill   aspirin 81 MG chewable tablet Chew 81 mg by mouth daily.     azithromycin (ZITHROMAX) 500 MG tablet Take 500 mg by mouth daily.     bictegravir-emtricitabine-tenofovir AF (BIKTARVY) 50-200-25 MG TABS tablet Take 1 tablet by mouth daily. 30 tablet 11   ethambutol (MYAMBUTOL) 400 MG tablet Take 400 mg by mouth daily.     fluticasone furoate-vilanterol (BREO ELLIPTA) 100-25 MCG/ACT AEPB Inhale 1 puff into the lungs daily. 60 each 5   mirtazapine (REMERON) 15 MG tablet Take 0.5 tablets (7.5 mg total) by mouth at bedtime. 15 tablet 5   Multiple Vitamin (QUINTABS) TABS Take 1 tablet by mouth daily.     QUEtiapine (SEROQUEL) 200 MG tablet Take 1 tablet (200 mg total) by mouth at bedtime. 30 tablet 5   sulfamethoxazole-trimethoprim (BACTRIM DS) 800-160 MG tablet Take 1 tablet by mouth daily. 30 tablet 11   aspirin EC 81 MG tablet Take 1 tablet (81 mg total) by mouth daily. Swallow whole. (Patient not taking: Reported on 07/13/2021) 30 tablet 11   atorvastatin (LIPITOR) 40 MG tablet Take 1 tablet (40 mg total) by mouth daily. (Patient not taking: Reported on 07/13/2021) 30 tablet 11   fluticasone furoate-vilanterol (BREO ELLIPTA) 100-25 MCG/INH AEPB Inhale 1 puff into the lungs daily. (Patient not taking: Reported on 04/21/2021) 60 each 5    megestrol (MEGACE ES) 625 MG/5ML suspension Take 5 mLs (625 mg total) by mouth daily. (Patient not taking: Reported on 07/13/2021) 150 mL 2   umeclidinium bromide (INCRUSE ELLIPTA) 62.5 MCG/INH AEPB Inhale 1 puff into the lungs daily. 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[place]) 07/08/2021   CVA (cerebral vascular accident) ([place]) 07/08/2021   History of cardioembolic cerebrovascular accident (CVA) 05/25/2021   Fever 03/25/2021   Rash of mouth present on examination 03/25/2021   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[place]) 03/25/2021   Need for prophylactic vaccination and inoculation against smallpox 03/25/2021   Numbness and tingling of right arm 03/24/2021   AIDS (acquired immune deficiency syndrome) ([place]) 03/05/2021   Emphysema lung ([place]) 03/05/2021   Cocaine-induced mood disorder ([place])    Tinea pedis 08/06/2019   At risk for opportunistic infections 07/14/2019   Dehydration 06/02/2019   Generalized weakness 06/02/2019   Cocaine abuse ([place]) 11/25/2018   Major depressive disorder, recurrent severe without psychotic features ([place]) 11/25/2018   Substance induced mood disorder ([place]) 09/16/2015   Cocaine use disorder, severe, dependence ([place])    Cannabis use disorder, severe, dependence ([place]) 04/09/2015   Severe recurrent major depression without psychotic features ([place]) 04/08/2015   Alcohol abuse 04/08/2015   Screening examination for venereal disease 03/25/2015  Encounter for long-term (current) use of medications 03/25/2015   Tobacco abuse    Visual disturbance 11/18/2013   Human immunodeficiency virus (HIV) disease ([place]) 10/30/2013   Atopic dermatitis 10/30/2013      EXAMINATION  VAsc (near with 2.5D lens): OD: 20/20   OS: 20/20-1  Pupils:  OD: Equal, round, reactive, no  APD OS: Equal, round, reactive, no APD  T(Pen): OD:  13  mm Hg OS:  13   mm Hg  CVF: full to finger counting OU EOM: full OU, no limitation or restriction OU  Slit lamp Exam: Ext/Lids: normal OU Conj/Sclera: white and quiet OU, no injection OU Cornea: clear OU, no abrasion or infiltrate OU AC: Deep and Quiet OU (mildly narrow nasally OD) Iris: Round and Flat OU Lens: Clear OU  Dilated OU with phenylephrine and tropicamide OU @ 1910 hours  Dilated Fundus Exam: Vitreous: Clear OU, no vitritis OU Disc: sharp and pink with 0.35 c/d OD, 0.45 c/d OS, no pallor or edema OU Macula: flat and dry without exudates or chorioretinal infiltrates OU Vessels: normal distribution OU, perfused OU, no vascular sheathing OU Periphery: no retinitis OU, no cotton wool spots OU, flat and attached 360 without breaks or tears OU, mild drusenoid deposits in peripheral nasal retina OU     Imp/Plan:  HIV/AIDS with low CD4 count No retinitis, vitritis, or uveitis noted OU No evidence of any opportunistic infection or any other eye infection Recommend regular dilated fundus exams depending on patient disposition and clinical condition Ethambutol use for disseminated MAC No evidence of significant Ethambutol toxicity, on albeit limited inpatient exam    Lonia Skinner, M.D. Ophthalmology [facility]

## 2021-07-13 NOTE — Assessment & Plan Note (Signed)
He will continue on Bactrim for PJP prophylaxis

## 2021-07-13 NOTE — Plan of Care (Signed)
New admission 5109, Failure to Thrive.  MRI/LP Results pending.  ID following   Problem: Education: Goal: Knowledge of General Education information will improve Description: Including pain rating scale, medication(s)/side effects and non-pharmacologic comfort measures Outcome: Progressing   Problem: Health Behavior/Discharge Planning: Goal: Ability to manage health-related needs will improve Outcome: Progressing   Problem: Clinical Measurements: Goal: Ability to maintain clinical measurements within normal limits will improve Outcome: Progressing Goal: Will remain free from infection Outcome: Progressing Goal: Diagnostic test results will improve Outcome: Progressing Goal: Respiratory complications will improve Outcome: Progressing Goal: Cardiovascular complication will be avoided Outcome: Progressing   Problem: Activity: Goal: Risk for activity intolerance will decrease Outcome: Progressing   Problem: Nutrition: Goal: Adequate nutrition will be maintained Outcome: Progressing   Problem: Coping: Goal: Level of anxiety will decrease Outcome: Progressing   Problem: Elimination: Goal: Will not experience complications related to bowel motility Outcome: Progressing Goal: Will not experience complications related to urinary retention Outcome: Progressing   Problem: Pain Managment: Goal: General experience of comfort will improve Outcome: Progressing   Problem: Safety: Goal: Ability to remain free from injury will improve Outcome: Progressing   Problem: Skin Integrity: Goal: Risk for impaired skin integrity will decrease Outcome: Progressing

## 2021-07-13 NOTE — Consult Note (Addendum)
[facility] Infectious [facility]    Date of Admission:  07/13/2021     Reason for Consult: aids    Referring Provider: Erlinda Hong    Abx: 06/16/2021-c azith/ethambutol        Assessment: 46 yo male with hiv/aids, hx stroke/right sided paresis, disseminated MAC infection, and hx substance abuse, recently dx'ed with PML, admitted for several weeks progressive functional decline, right sided paresis despite being consistently on biktarvy and well controlled virologically  #hiv/aids -followed by [facility] for hiv care; last seen 07/08/2021 -resumed hiv care/art 02/2021 -virologic / immunologic discordance with inability to recover cd4 count; no clear benefit of trying to bring cd4 count up if taking meds consistently and has controlled viral load -he had gap in art, so just consistently started biktarvy again 02/2021 hope his cd4 will recover -compliant with biktarvy -takes bactrim for pjp prophy  Lab Results  Component Value Date   HIV1RNAQUANT <20 (H) 07/08/2021   Lab Results  Component Value Date   CD4TCELL 6 (L) 07/08/2021   CD4TABS <35 (L) 05/25/2021     #pml -1/04 presented with impaired gait hospitalized in [place] with mri finding concerning for OI --> transferred to [place] biopsy brain c/w PML. Presumed unmasking of PML -csf jc virus was negative previously -this is an unfortunate issue. This dx carried very poor prognosis and high mortality. The only tx is ART, but despite this ?progressing vs other OI. Other adjunctive medication tried in literature but no benefit -would consider repeat mri brain and do LP. He certainly could have cmv, tb, syphilis, fungal infection, cns lymphoma now since the recent pml dx   #disseminated MAC -02/2021 sputum and bcx positive; started on azith/ethambutol stopped 04/21/2021 but then resumed 06/16/2021 during PML brain biopsy admission at [facility]. Repeat bcx positive for AML and had fever/chill -he is tolerating his  MAC regimen. Will continue -- duration at least a year with reconstitution of immune system, although unclear if his cd4 count will recover    #visual disturbance -describes flashes of light, blurriness, and floaters -consider cmv retinitis, vs other oi, vs ethambutol toxicity, vs pml progression -primary team had discussed with ophthalmology who will see inpatient   #right side paresis #hx cva -appears to be chronic process, although might be worsened recently -see pml w/u above -supportive care    #hepatitis screening -06/28/2021 hep b core ab reactive; hep b sAb reactive; sAg/dna negative. Hep a ab reactive. Hep c ab nonreactive   #prognosis -Very poor in setting of PML. But pending workup for other OI or non-infection related complication of aids. -a very appropriate candidate for hospice -- discussed with him he can still take his current medication even with hospice care      Plan: Will order brain mri with contrast Continue bactrim daily prophy, azith/ethambutol Continue biktarvy Await lp result (discussed with team to send cytology, csf pcr, ebv/jc virus pcr, afb/fungal cx, vdrl) Will also repeat serum fungal serology and rpr Discussed with primary team   ---- Addendum Await ophthalmology evaluation of his eyes. Defer empiric cmv treatment at this time  ------------------------------------------------ Principal Problem:   FTT (failure to thrive) in adult    HPI: Kurt Foley is a 46 y.o. male aids, hx prior cva with chronic right sided paresis, ct chest emphysema, hx cocaine abuse, virologic--immunologic discordance on biktarvy, recent pml dx, MAI infection, here for progressive right sided weakness and inability to care for self  He has been compliant with biktarvy since 02/2021 when he reengages care. Had admission [facility] on 9/23 with gastroenteritis (norovirus, giardia, epec, eaec (improved with azith/rocephin/metronidazole), IRIS.   He  continues to be compliant with biktarvy since restarting but the last few years cd4 <50 and hasn't improved  He was also initiated for presumed disseminated MAC in setting positive sputum cx for MAI. Quant gold negative. He did have 9/27 afb blood cx which per report was positive. 11/28 repeat afb bcx negative  Most recently around 06/14/2021 readmitted to [place] for right leg pain/numbness worse from previous cva. Mri mentioned possible cns mass vs iris vs pml (flair signal abnormality progressed from prior imaging). Csf analysis was bland with jc virus pcr negative. Ebv pcr, Hsv/vzv pcr, toxoplasma pcr, and cytology were also negative. Wbc 1, 2 rbc, mildly elevated protein. Csf cx staph epi thought to be contaminant. He was transferred to [facility] and undergone brain biopsy which was consistent with pmL:           1/17 abd pelv ct with contrast 1. As compared to prior CT scans 05/08/2021 and 05/09/2021, there has been significant reduction in size of mesenteric lymph nodes. There has been a more modest decrease in size of mediastinal and retroperitoneal lymph nodes. See measurements above.  2. No new sites of adenopathy identified.  3. Extensive background of pulmonary emphysema.  4. Improved pulmonary aeration as compared to CT 05/09/2021.  5. Additional unchanged ancillary findings as noted above.  06/15/21 - MR BRAIN WWO  1. Confluent FLAIR signal abnormality in the left pre and postcentral gyri with associated curvilinear enhancement, increased in extent since 05/09/2021. Differential includes PML, IRIS, ADEM, or other demyelinating disease. The appearance is not typical for evolving infarct. 2. Additional scattered foci of FLAIR signal abnormality throughout the remainder of the subcortical and periventricular white matter are unchanged and remain nonspecific.  PATHOLOGY:   06/30/21 - Brain Biopsy   Final Pathologic Diagnosis  A, B. LEFT FRONTAL BRAIN LESION,  BIOPSIES: Progressive multifocal leukoencephalopathy. See Comment and Special Stains.   Comment  The biopsies demonstrate abundant macrophages and reactive gliosis. An immunohistochemical stain for SV40 is positive, supporting the diagnosis.   Other recent studies: Quant gold negative 06/16/21 06/2021 toxo serology, serum crypto ag, rpr negative 05/2021 endemic fungi serology negative   His MAC tx was discontinued 04/21/21 but resumed during the 06/16/2021 admission. Bcx again was positive for mac per discharge summary -- but I do not see any afb bcx actual result    Family History  Problem Relation Age of Onset   Diabetes Mother    Hypertension Mother    Arthritis Mother     Social History   Tobacco Use   Smoking status: Former    Packs/day: 0.50    Years: 25.00    Pack years: 12.50    Types: Cigarettes   Smokeless tobacco: Never   Tobacco comments:    cutting back  Vaping Use   Vaping Use: Never used  Substance Use Topics   Alcohol use: Not Currently    Alcohol/week: 2.0 standard drinks    Types: 2 Standard drinks or equivalent per week    Comment: 3-4 days a week.Last drink: today   Drug use: Not Currently    Types: Marijuana, Cocaine    Comment: Once a week. Last used yesterday.  Cocaine last used today.     No Known Allergies  Review of Systems: ROS All Other ROS was negative, except  mentioned above   Past Medical History:  Diagnosis Date   Depression    HIV infection ([place])        Scheduled Meds:  lidocaine  10 mL Infiltration Once   Continuous Infusions: PRN Meds:.   OBJECTIVE: Blood pressure 99/86, pulse (!) 102, temperature 98.5 F (36.9 C), temperature source Oral, resp. rate 16, SpO2 100 %.  Physical Exam  General/constitutional: no distress, pleasant HEENT: Normocephalic, PER, Conj Clear, EOMI, Oropharynx clear Neck supple CV: rrr no mrg Lungs: clear to auscultation, normal respiratory effort Abd: Soft, Nontender Ext: no  edema Skin: No Rash Neuro: right sided paresis MSK: right sided paresis Psych alert/oriented  Lab Results Lab Results  Component Value Date   WBC 4.3 07/13/2021   HGB 10.2 (L) 07/13/2021   HCT 31.8 (L) 07/13/2021   MCV 88.3 07/13/2021   PLT 542 (H) 07/13/2021    Lab Results  Component Value Date   CREATININE 1.05 07/13/2021   BUN 14 07/13/2021   NA 130 (L) 07/13/2021   K 4.7 07/13/2021   CL 102 07/13/2021   CO2 19 (L) 07/13/2021    Lab Results  Component Value Date   ALT 91 (H) 07/13/2021   AST 80 (H) 07/13/2021   ALKPHOS 244 (H) 07/13/2021   BILITOT 0.4 07/13/2021      Microbiology: No results found for this or any previous visit (from the past 240 hour(s)).   Serology:    Imaging: If present, new imagings (plain films, ct scans, and mri) have been personally visualized and interpreted; radiology reports have been reviewed. Decision making incorporated into the Impression / Recommendations.  2/01 ct head noncon 1. No acute abnormality. 2. Stable confluent subcortical white matter low density in the left cerebral hemisphere compatible with the reported recently diagnosed PML. 3. Stable old left thalamic lacunar infarct and minimal chronic small vessel white matter ischemic changes in both cerebral hemispheres.   Jabier Mutton, [place] [place] [phone] pager    07/13/2021, 4:00 PM

## 2021-07-14 ENCOUNTER — Observation Stay (HOSPITAL_COMMUNITY): Payer: Medicaid Other

## 2021-07-14 ENCOUNTER — Inpatient Hospital Stay (HOSPITAL_COMMUNITY): Payer: Medicaid Other

## 2021-07-14 DIAGNOSIS — H539 Unspecified visual disturbance: Secondary | ICD-10-CM | POA: Diagnosis present

## 2021-07-14 DIAGNOSIS — Z79899 Other long term (current) drug therapy: Secondary | ICD-10-CM | POA: Diagnosis not present

## 2021-07-14 DIAGNOSIS — R7989 Other specified abnormal findings of blood chemistry: Secondary | ICD-10-CM | POA: Diagnosis present

## 2021-07-14 DIAGNOSIS — H547 Unspecified visual loss: Secondary | ICD-10-CM

## 2021-07-14 DIAGNOSIS — Z87891 Personal history of nicotine dependence: Secondary | ICD-10-CM | POA: Diagnosis not present

## 2021-07-14 DIAGNOSIS — Z20822 Contact with and (suspected) exposure to covid-19: Secondary | ICD-10-CM | POA: Diagnosis present

## 2021-07-14 DIAGNOSIS — B20 Human immunodeficiency virus [HIV] disease: Secondary | ICD-10-CM

## 2021-07-14 DIAGNOSIS — D709 Neutropenia, unspecified: Secondary | ICD-10-CM | POA: Diagnosis not present

## 2021-07-14 DIAGNOSIS — D61818 Other pancytopenia: Secondary | ICD-10-CM | POA: Diagnosis not present

## 2021-07-14 DIAGNOSIS — R000 Tachycardia, unspecified: Secondary | ICD-10-CM | POA: Diagnosis not present

## 2021-07-14 DIAGNOSIS — Z8249 Family history of ischemic heart disease and other diseases of the circulatory system: Secondary | ICD-10-CM | POA: Diagnosis not present

## 2021-07-14 DIAGNOSIS — Z7982 Long term (current) use of aspirin: Secondary | ICD-10-CM | POA: Diagnosis not present

## 2021-07-14 DIAGNOSIS — I69322 Dysarthria following cerebral infarction: Secondary | ICD-10-CM | POA: Diagnosis not present

## 2021-07-14 DIAGNOSIS — G0490 Encephalitis and encephalomyelitis, unspecified: Secondary | ICD-10-CM | POA: Diagnosis not present

## 2021-07-14 DIAGNOSIS — Z681 Body mass index (BMI) 19 or less, adult: Secondary | ICD-10-CM | POA: Diagnosis not present

## 2021-07-14 DIAGNOSIS — Z515 Encounter for palliative care: Secondary | ICD-10-CM | POA: Diagnosis not present

## 2021-07-14 DIAGNOSIS — A312 Disseminated mycobacterium avium-intracellulare complex (DMAC): Secondary | ICD-10-CM | POA: Diagnosis present

## 2021-07-14 DIAGNOSIS — I69351 Hemiplegia and hemiparesis following cerebral infarction affecting right dominant side: Secondary | ICD-10-CM | POA: Diagnosis not present

## 2021-07-14 DIAGNOSIS — R5381 Other malaise: Secondary | ICD-10-CM | POA: Diagnosis not present

## 2021-07-14 DIAGNOSIS — Z8261 Family history of arthritis: Secondary | ICD-10-CM | POA: Diagnosis not present

## 2021-07-14 DIAGNOSIS — G479 Sleep disorder, unspecified: Secondary | ICD-10-CM | POA: Diagnosis not present

## 2021-07-14 DIAGNOSIS — E860 Dehydration: Secondary | ICD-10-CM | POA: Diagnosis present

## 2021-07-14 DIAGNOSIS — E440 Moderate protein-calorie malnutrition: Secondary | ICD-10-CM | POA: Diagnosis present

## 2021-07-14 DIAGNOSIS — D72810 Lymphocytopenia: Secondary | ICD-10-CM | POA: Diagnosis present

## 2021-07-14 DIAGNOSIS — J449 Chronic obstructive pulmonary disease, unspecified: Secondary | ICD-10-CM | POA: Diagnosis not present

## 2021-07-14 DIAGNOSIS — A310 Pulmonary mycobacterial infection: Secondary | ICD-10-CM

## 2021-07-14 DIAGNOSIS — N179 Acute kidney failure, unspecified: Secondary | ICD-10-CM | POA: Diagnosis not present

## 2021-07-14 DIAGNOSIS — R471 Dysarthria and anarthria: Secondary | ICD-10-CM | POA: Diagnosis not present

## 2021-07-14 DIAGNOSIS — Z833 Family history of diabetes mellitus: Secondary | ICD-10-CM | POA: Diagnosis not present

## 2021-07-14 DIAGNOSIS — I639 Cerebral infarction, unspecified: Secondary | ICD-10-CM | POA: Diagnosis not present

## 2021-07-14 DIAGNOSIS — A812 Progressive multifocal leukoencephalopathy: Secondary | ICD-10-CM

## 2021-07-14 DIAGNOSIS — E871 Hypo-osmolality and hyponatremia: Secondary | ICD-10-CM | POA: Diagnosis present

## 2021-07-14 DIAGNOSIS — E876 Hypokalemia: Secondary | ICD-10-CM | POA: Diagnosis present

## 2021-07-14 DIAGNOSIS — R627 Adult failure to thrive: Principal | ICD-10-CM

## 2021-07-14 LAB — COMPREHENSIVE METABOLIC PANEL
ALT: 83 U/L — ABNORMAL HIGH (ref 0–44)
AST: 75 U/L — ABNORMAL HIGH (ref 15–41)
Albumin: 3.2 g/dL — ABNORMAL LOW (ref 3.5–5.0)
Alkaline Phosphatase: 193 U/L — ABNORMAL HIGH (ref 38–126)
Anion gap: 7 (ref 5–15)
BUN: 16 mg/dL (ref 6–20)
CO2: 16 mmol/L — ABNORMAL LOW (ref 22–32)
Calcium: 9.3 mg/dL (ref 8.9–10.3)
Chloride: 106 mmol/L (ref 98–111)
Creatinine, Ser: 0.98 mg/dL (ref 0.61–1.24)
GFR, Estimated: >60 mL/min (ref >60)
Glucose, Bld: 101 mg/dL — ABNORMAL HIGH (ref 70–99)
Potassium: 4.4 mmol/L (ref 3.5–5.1)
Sodium: 129 mmol/L — ABNORMAL LOW (ref 135–145)
Total Bilirubin: 0.2 mg/dL — ABNORMAL LOW (ref 0.3–1.2)
Total Protein: 8.1 g/dL (ref 6.5–8.1)

## 2021-07-14 LAB — CBC
HCT: 27.4 % — ABNORMAL LOW (ref 39.0–52.0)
Hemoglobin: 9 g/dL — ABNORMAL LOW (ref 13.0–17.0)
MCH: 28.7 pg (ref 26.0–34.0)
MCHC: 32.8 g/dL (ref 30.0–36.0)
MCV: 87.3 fL (ref 80.0–100.0)
Platelets: 405 10*3/uL — ABNORMAL HIGH (ref 150–400)
RBC: 3.14 MIL/uL — ABNORMAL LOW (ref 4.22–5.81)
RDW: 17.1 % — ABNORMAL HIGH (ref 11.5–15.5)
WBC: 3.9 10*3/uL — ABNORMAL LOW (ref 4.0–10.5)
nRBC: 0 % (ref 0.0–0.2)

## 2021-07-14 LAB — CRYPTOCOCCAL ANTIGEN, CSF: Crypto Ag: NEGATIVE

## 2021-07-14 LAB — CBC WITH DIFFERENTIAL/PLATELET
Abs Immature Granulocytes: 0.29 10*3/uL — ABNORMAL HIGH (ref 0.00–0.07)
Basophils Absolute: 0 10*3/uL (ref 0.0–0.1)
Basophils Relative: 1 %
Eosinophils Absolute: 0 10*3/uL (ref 0.0–0.5)
Eosinophils Relative: 1 %
HCT: 31.1 % — ABNORMAL LOW (ref 39.0–52.0)
Hemoglobin: 9.9 g/dL — ABNORMAL LOW (ref 13.0–17.0)
Immature Granulocytes: 7 %
Lymphocytes Relative: 16 %
Lymphs Abs: 0.7 10*3/uL (ref 0.7–4.0)
MCH: 28.1 pg (ref 26.0–34.0)
MCHC: 31.8 g/dL (ref 30.0–36.0)
MCV: 88.4 fL (ref 80.0–100.0)
Monocytes Absolute: 0.4 10*3/uL (ref 0.1–1.0)
Monocytes Relative: 9 %
Neutro Abs: 2.8 10*3/uL (ref 1.7–7.7)
Neutrophils Relative %: 66 %
Platelets: 396 10*3/uL (ref 150–400)
RBC: 3.52 MIL/uL — ABNORMAL LOW (ref 4.22–5.81)
RDW: 16.9 % — ABNORMAL HIGH (ref 11.5–15.5)
WBC: 4.2 10*3/uL (ref 4.0–10.5)
nRBC: 0 % (ref 0.0–0.2)

## 2021-07-14 LAB — RPR: RPR Ser Ql: NONREACTIVE

## 2021-07-14 LAB — CRYPTOCOCCAL ANTIGEN: Crypto Ag: NEGATIVE

## 2021-07-14 MED ORDER — SODIUM CHLORIDE 0.9 % IV BOLUS
500.0000 mL | Freq: Once | INTRAVENOUS | Status: AC
  Administered 2021-07-14: 500 mL via INTRAVENOUS

## 2021-07-14 NOTE — Progress Notes (Signed)
[facility]  Date of Admission:  07/13/2021     Abx: 06/16/2021-c azith/ethambutol                                                      Assessment: 46 yo male with hiv/aids, hx stroke/right sided paresis, disseminated MAC infection, and hx substance abuse, recently dx'ed with PML, admitted for several weeks progressive functional decline, right sided paresis despite being consistently on biktarvy and well controlled virologically   #hiv/aids -followed by [facility] for hiv care; last seen 07/08/2021 -resumed hiv care/art 02/2021 -- prior to that not consistently taking and/or not on for years although he doesn't remember detail -virologic / immunologic discordance with inability to recover cd4 count; no clear benefit of trying to bring cd4 count up if taking meds consistently and has controlled viral load -he had gap in art, so just consistently started biktarvy again 02/2021 hope his cd4 will recover -compliant with biktarvy --- continue here -takes bactrim for pjp prophy --- continue here   Recent Labs       Lab Results  Component Value Date    HIV1RNAQUANT <20 (H) 07/08/2021      Recent Labs       Lab Results  Component Value Date    CD4TCELL 6 (L) 07/08/2021    CD4TABS <35 (L) 05/25/2021          #pml -1/04 presented with impaired gait hospitalized in [place] with mri finding concerning for OI --> transferred to [facility] biopsy brain c/w PML. Presumed unmasking of PML -csf jc virus was negative previously -this is an unfortunate issue. This dx carried very poor prognosis and high mortality. The only tx is ART, but despite this ?progressing vs other OI. Other adjunctive medication tried in literature but no benefit -awaiting repeat mri brain. He certainly could have cmv, tb, syphilis, fungal infection, cns lymphoma now since the recent pml dx -LP done 2/01 unable to do cell count but normal protein and glucose/clear  appearance  Current ID w/u this admission: Rpr negative Crypto ag negative Pending blasto/histo serology Pending csf testing -- vdrl, meningoencephalitis pcr, ebv, cmv, jc virus pcr, tb culture, cytology     #disseminated MAC -02/2021 sputum and bcx positive; started on azith/ethambutol stopped 04/21/2021 but then resumed 06/16/2021 during PML brain biopsy admission at [facility]. Repeat bcx positive for AML and had fever/chill -he is tolerating his MAC regimen. Will continue -- duration at least a year with reconstitution of immune system, although unclear if his cd4 count will recover  -2/1 ophthalmology exam no intraoccular concern for infection or sign of ethambutol toxicity     #visual disturbance -describes flashes of light, blurriness, and floaters -consider cmv retinitis, vs other oi, vs ethambutol toxicity, vs pml progression -primary team had discussed with ophthalmology who will see inpatient     #right side paresis #hx cva -appears to be chronic process, although might be worsened recently ?deconditioning for new process -see pml w/u above -LP done 2/01 pending several micro testing -- normal glucose/protein; unable to do cell count -supportive care -would potentially benefit from aggressive PT/OT       #hepatitis screening -06/28/2021 hep b core ab reactive; hep b sAb reactive; hep B sAg/dna negative. Hep a ab reactive.  Hep c ab nonreactive -at this time appear to have cleared his hep B infection in the past and is "protected."     #prognosis -Very poor in setting of PML. But pending workup for other OI or non-infection related complication of aids. -even if he survives, will remain with severe comorbid neurologic deficit -a very appropriate candidate for hospice/palliative care -- discussed with him he can still take his current medication even with hospice care -as of 07/14/21 patient and his family refused to engage with palliative team         Plan: Await  mri brain with contrast Continue MAC treatment with azith/ethambutol -- restarted 06/16/2021 Continue biktarvy for hiv F/u csf testing and serum fungal serology Agree with PT/OT Likely can start disposition planning -- patient can follow up with dr Linus Salmons for testing result (would at least await ebv/cytology for cns lymphoma)  Discussed with primary team   I have made him an ID clinic f/u with Dr Linus Salmons on 2/16 @ 3pm      Principal Problem:   FTT (failure to thrive) in adult Active Problems:   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[place])   AIDS (acquired immune deficiency syndrome) ([place])   History of cardioembolic cerebrovascular accident (CVA)   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[place])   CVA (cerebral vascular accident) ([place])   No Known Allergies  Scheduled Meds:  azithromycin  500 mg Oral QHS   bictegravir-emtricitabine-tenofovir AF  1 tablet Oral Daily   enoxaparin (LOVENOX) injection  40 mg Subcutaneous Q24H   ethambutol  400 mg Oral Daily   fluticasone furoate-vilanterol  1 puff Inhalation Daily   LORazepam  0.5 mg Intravenous Once   mirtazapine  7.5 mg Oral QHS   QUEtiapine  200 mg Oral QHS   sulfamethoxazole-trimethoprim  1 tablet Oral Daily   Continuous Infusions:  sodium chloride 75 mL/hr at 07/14/21 0522   PRN Meds:.oxyCODONE   SUBJECTIVE: No change in how he feels subjectively Await mri Lp done yesterday not able to do cell count Not interested in palliative care  Ophthalmology saw no intraoccular infectious process concern or sign of ethambutol toxicity  Review of Systems: ROS All other ROS was negative, except mentioned above     OBJECTIVE: Vitals:   07/14/21 0244 07/14/21 0635 07/14/21 0818 07/14/21 1059  BP: 94/80 104/71  111/87  Pulse: (!) 122 (!) 115  (!) 104  Resp: _0 Temp: 98 F (36.7 C) 99.7 F (37.6 C)  98.9 F (37.2 C)  TempSrc: Axillary Oral  Oral  SpO2: 100% 100% 100% 99%   There is no height  or weight on file to calculate BMI.  Physical Exam  General/constitutional: no distress, pleasant HEENT: Normocephalic, PER, Conj Clear, EOMI, Oropharynx clear Neck supple CV: rrr no mrg Lungs: clear to auscultation, normal respiratory effort Abd: Soft, Nontender Ext: no edema Skin: No Rash Neuro: stable generalized weakness with rightsided paresis and dysarthria MSK: no peripheral joint swelling/tenderness/warmth; back spines nontender    Lab Results Lab Results  Component Value Date   WBC 3.9 (L) 07/14/2021   HGB 9.0 (L) 07/14/2021   HCT 27.4 (L) 07/14/2021   MCV 87.3 07/14/2021   PLT 405 (H) 07/14/2021    Lab Results  Component Value Date   CREATININE 0.98 07/14/2021   BUN 16 07/14/2021   NA 129 (L) 07/14/2021   K 4.4 07/14/2021   CL 106 07/14/2021   CO2 16 (L) 07/14/2021    Lab Results  Component Value  Date   ALT 83 (H) 07/14/2021   AST 75 (H) 07/14/2021   ALKPHOS 193 (H) 07/14/2021   BILITOT 0.2 (L) 07/14/2021      Microbiology: Recent Results (from the past 240 hour(s))  CSF culture     Status: None (Preliminary result)   Collection Time: 07/13/21  4:11 PM   Specimen: CSF; Cerebrospinal Fluid  Result Value Ref Range Status   Specimen Description   Final    CSF Performed at [facility], [place] [address] [place], [place] [address]    Special Requests   Final    NONE Performed at [facility], [place] [address] [place], [place] [address]    Gram Stain   Final    NO WBC SEEN NO ORGANISMS SEEN CYTOSPIN SMEAR Gram Stain Report Called to,Read Back By and Verified With: H.YOUNT, RN AT 1826 ON 02.01.23 BY N.THOMPSON    Culture PENDING  Incomplete   Report Status PENDING  Incomplete  Resp Panel by RT-PCR (Flu A&B, Covid) Nasopharyngeal Swab     Status: None   Collection Time: 07/13/21  4:17 PM   Specimen: Nasopharyngeal Swab; Nasopharyngeal(NP) swabs in vial transport medium  Result Value Ref Range Status   SARS  Coronavirus 2 by RT PCR NEGATIVE NEGATIVE Final    Comment: (NOTE) SARS-CoV-2 target nucleic acids are NOT DETECTED.  The SARS-CoV-2 RNA is generally detectable in upper respiratory specimens during the acute phase of infection. The lowest concentration of SARS-CoV-2 viral copies this assay can detect is 138 copies/mL. A negative result does not preclude SARS-Cov-2 infection and should not be used as the sole basis for treatment or other patient management decisions. A negative result may occur with  improper specimen collection/handling, submission of specimen other than nasopharyngeal swab, presence of viral mutation(s) within the areas targeted by this assay, and inadequate number of viral copies(<138 copies/mL). A negative result must be combined with clinical observations, patient history, and epidemiological information. The expected result is Negative.  Fact Sheet for Patients:  EntrepreneurPulse.com.au  Fact Sheet for Healthcare Providers:  IncredibleEmployment.be  This test is no t yet approved or cleared by the [place] FDA and  has been authorized for detection and/or diagnosis of SARS-CoV-2 by FDA under an Emergency Use Authorization (EUA). This EUA will remain  in effect (meaning this test can be used) for the duration of the COVID-19 declaration under Section 564(b)(1) of the Act, 21 U.S.C.section 360bbb-3(b)(1), unless the authorization is terminated  or revoked sooner.       Influenza A by PCR NEGATIVE NEGATIVE Final   Influenza B by PCR NEGATIVE NEGATIVE Final    Comment: (NOTE) The Xpert Xpress SARS-CoV-2/FLU/RSV plus assay is intended as an aid in the diagnosis of influenza from Nasopharyngeal swab specimens and should not be used as a sole basis for treatment. Nasal washings and aspirates are unacceptable for Xpert Xpress SARS-CoV-2/FLU/RSV testing.  Fact Sheet for  Patients: EntrepreneurPulse.com.au  Fact Sheet for Healthcare Providers: IncredibleEmployment.be  This test is not yet approved or cleared by the [place] FDA and has been authorized for detection and/or diagnosis of SARS-CoV-2 by FDA under an Emergency Use Authorization (EUA). This EUA will remain in effect (meaning this test can be used) for the duration of the COVID-19 declaration under Section 564(b)(1) of the Act, 21 U.S.C. section 360bbb-3(b)(1), unless the authorization is terminated or revoked.  Performed at [facility], [place] [address], [place], [place] [address]  Serology:   Imaging: If present, new imagings (plain films, ct scans, and mri) have been personally visualized and interpreted; radiology reports have been reviewed. Decision making incorporated into the Impression / Recommendations.  2/01 head ct 2/01 ct head noncon 1. No acute abnormality. 2. Stable confluent subcortical white matter low density in the left cerebral hemisphere compatible with the reported recently diagnosed PML. 3. Stable old left thalamic lacunar infarct and minimal chronic small vessel white matter ischemic changes in both cerebral hemispheres.     Previous outside hospital workup: 1/17 abd pelv ct with contrast 1. As compared to prior CT scans 05/08/2021 and 05/09/2021, there has been significant reduction in size of mesenteric lymph nodes. There has been a more modest decrease in size of mediastinal and retroperitoneal lymph nodes. See measurements above.  2. No new sites of adenopathy identified.  3. Extensive background of pulmonary emphysema.  4. Improved pulmonary aeration as compared to CT 05/09/2021.  5. Additional unchanged ancillary findings as noted above.   06/15/21 - MR BRAIN WWO  1. Confluent FLAIR signal abnormality in the left pre and postcentral gyri with associated curvilinear enhancement,  increased in extent since 05/09/2021. Differential includes PML, IRIS, ADEM, or other demyelinating disease. The appearance is not typical for evolving infarct. 2. Additional scattered foci of FLAIR signal abnormality throughout the remainder of the subcortical and periventricular white matter are unchanged and remain nonspecific.   PATHOLOGY:  06/30/21 - Brain Biopsy   Final Pathologic Diagnosis  A, B. LEFT FRONTAL BRAIN LESION, BIOPSIES: Progressive multifocal leukoencephalopathy. See Comment and Special Stains.   Comment  The biopsies demonstrate abundant macrophages and reactive gliosis. An immunohistochemical stain for SV40 is positive, supporting the diagnosis  Jabier Mutton, [facility] [place] [phone] pager    07/14/2021, 1:29 PM

## 2021-07-14 NOTE — ED Provider Notes (Signed)
Kurt Foley Provider Note   CSN: [id number] Arrival date & time: 07/13/21  1053     History  Chief Complaint  Patient presents with   Leg Pain    Kurt Foley is a 46 y.o. male.  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  Patient indicates that after Thanksgiving or Christmas his health is steadily declined.  He started having severe right-sided weakness, making it difficult for him to walk.  He went to outside hospital and had brain biopsy which revealed PML.  Subsequently he has been having increased weakness.  He is also having slurred speech, reduced appetite and poor balance.  Patient has been taking his antiretrovirals as prescribed.  He is understanding of the condition is that there is no cure, but taking his antiviral should help.  Review of system is negative for nausea, vomiting, fevers, chills.  Home Medications Prior to Admission medications   Medication Sig Start Date End Date Taking? Authorizing Provider  aspirin 81 MG chewable tablet Chew 81 mg by mouth daily. 05/22/21  Yes [provider]  azithromycin (ZITHROMAX) 500 MG tablet Take 500 mg by mouth daily. 04/26/21  Yes [provider]  bictegravir-emtricitabine-tenofovir AF (BIKTARVY) 50-200-25 MG TABS tablet Take 1 tablet by mouth daily. 02/24/21  Yes Comer, Okey Regal, MD  ethambutol (MYAMBUTOL) 400 MG tablet Take 400 mg by mouth daily. 05/20/21  Yes [provider]  fluticasone furoate-vilanterol (BREO ELLIPTA) 100-25 MCG/ACT AEPB Inhale 1 puff into the lungs daily. 05/03/21  Yes Olalere, Adewale A, MD  mirtazapine (REMERON) 15 MG tablet Take 0.5 tablets (7.5 mg total) by mouth at bedtime. 03/25/21  Yes Comer, Okey Regal, MD  Multiple Vitamin Artis Flock) TABS Take 1 tablet by mouth daily.  05/20/21  Yes [provider]  QUEtiapine (SEROQUEL) 200 MG tablet Take 1 tablet (200 mg total) by mouth at bedtime. 03/25/21  Yes Comer, Okey Regal, MD  sulfamethoxazole-trimethoprim (BACTRIM DS) 800-160 MG tablet Take 1 tablet by mouth daily. 05/25/21  Yes Comer, Okey Regal, MD  aspirin EC 81 MG tablet Take 1 tablet (81 mg total) by mouth daily. Swallow whole. Patient not taking: Reported on 07/13/2021 05/25/21   Thayer Headings, MD  atorvastatin (LIPITOR) 40 MG tablet Take 1 tablet (40 mg total) by mouth daily. Patient not taking: Reported on 07/13/2021 05/25/21   Thayer Headings, MD  fluticasone furoate-vilanterol (BREO ELLIPTA) 100-25 MCG/INH AEPB Inhale 1 puff into the lungs daily. Patient not taking: Reported on 04/21/2021 03/25/21   Thayer Headings, MD  megestrol (MEGACE ES) 625 MG/5ML suspension Take 5 mLs (625 mg total) by mouth daily. Patient not taking: Reported on 07/13/2021 05/25/21   Thayer Headings, MD  umeclidinium bromide (INCRUSE ELLIPTA) 62.5 MCG/INH AEPB Inhale 1 puff into the lungs daily. Patient not taking: Reported on 04/21/2021 03/25/21   Thayer Headings, MD      Allergies    Patient has no known allergies.    Review of Systems   Review of Systems  All other systems reviewed and are negative.  Physical Exam Updated Vital Signs BP 116/84    Pulse (!) 114    Temp 98.3 F (36.8 C) (Oral)    Resp (!) 32    SpO2 100%  Physical Exam Vitals and nursing note reviewed.  Constitutional:  Appearance: He is well-developed.  HENT:     Head: Atraumatic.  Cardiovascular:     Rate and Rhythm: Normal rate.  Pulmonary:     Effort: Pulmonary effort is normal.  Musculoskeletal:     Cervical back: Neck supple.  Skin:    General: Skin is warm.  Neurological:     Mental Status: He is alert and oriented to person, place, and time.     Comments: Right sided facial droop, right upper and lower extremity weakness, subjective numbness over the right side    ED Results /  Procedures / Treatments   Labs (all labs ordered are listed, but only abnormal results are displayed) Labs Reviewed  COMPREHENSIVE METABOLIC PANEL - Abnormal; Notable for the following components:      Result Value   Sodium 130 (*)    CO2 19 (*)    Total Protein 10.1 (*)    AST 80 (*)    ALT 91 (*)    Alkaline Phosphatase 244 (*)    All other components within normal limits  CBC WITH DIFFERENTIAL/PLATELET - Abnormal; Notable for the following components:   RBC 3.60 (*)    Hemoglobin 10.2 (*)    HCT 31.8 (*)    RDW 16.8 (*)    Platelets 542 (*)    Lymphs Abs 0.5 (*)    Abs Immature Granulocytes 0.08 (*)    All other components within normal limits  CSF CELL COUNT WITH DIFFERENTIAL - Abnormal; Notable for the following components:   RBC Count, CSF 620 (*)    All other components within normal limits  CSF CELL COUNT WITH DIFFERENTIAL - Abnormal; Notable for the following components:   RBC Count, CSF 22 (*)    All other components within normal limits  CBC - Abnormal; Notable for the following components:   WBC 3.9 (*)    RBC 3.14 (*)    Hemoglobin 9.0 (*)    HCT 27.4 (*)    RDW 17.1 (*)    Platelets 405 (*)    All other components within normal limits  COMPREHENSIVE METABOLIC PANEL - Abnormal; Notable for the following components:   Sodium 129 (*)    CO2 16 (*)    Glucose, Bld 101 (*)    Albumin 3.2 (*)    AST 75 (*)    ALT 83 (*)    Alkaline Phosphatase 193 (*)    Total Bilirubin 0.2 (*)    All other components within normal limits  CSF CULTURE W GRAM STAIN  CULTURE, FUNGUS WITHOUT SMEAR  RESP PANEL BY RT-PCR (FLU A&B, COVID) [id number]  ACID FAST SMEAR (AFB, MYCOBACTERIA)  ACID FAST CULTURE WITH REFLEXED SENSITIVITIES (MYCOBACTERIA)  BLASTOMYCES ANTIGEN  ETHANOL  TSH  GLUCOSE, CSF  PROTEIN, CSF  CRYPTOCOCCAL ANTIGEN, CSF  CRYPTOCOCCAL ANTIGEN  RPR  EPSTEIN BARR VRS(EBV DNA BY PCR)  JC VIRUS, PCR CSF  CMV DNA BY PCR, QUALITATIVE  VDRL, CSF  MISC [facility] TEST  (SEND OUT)  HISTOPLASMA ANTIGEN, URINE  CYTOLOGY - NON PAP    EKG None  Radiology CT Head Wo Contrast  Result Date: 07/13/2021 CLINICAL DATA:  Neuro deficit. History of age and previous stroke. Reported recent brain biopsy at [facility] demonstrating PML. EXAM: CT HEAD WITHOUT CONTRAST TECHNIQUE: Contiguous axial images were obtained from the base of the skull through the vertex without intravenous contrast. RADIATION DOSE REDUCTION: This exam was performed according to the departmental dose-optimization program which includes automated exposure control, adjustment of the mA  and/or kV according to patient size and/or use of iterative reconstruction technique. COMPARISON:  06/14/2021 and brain MR dated 06/15/2021 FINDINGS: Brain: No significant change in previously demonstrated confluent white matter low density in the subcortical pre central and postcentral regions on the left. No significant change in mild patchy white matter low density elsewhere in both cerebral hemispheres and old left thalamic infarct extending inferiorly into the anterior pons on the left. No intracranial hemorrhage, mass lesion or CT evidence of acute infarction. Vascular: No hyperdense vessel or unexpected calcification. Skull: Normal. Negative for fracture or focal lesion. Sinuses/Orbits: Unremarkable. Other: None. IMPRESSION: 1. No acute abnormality. 2. Stable confluent subcortical white matter low density in the left cerebral hemisphere compatible with the reported recently diagnosed PML. 3. Stable old left thalamic lacunar infarct and minimal chronic small vessel white matter ischemic changes in both cerebral hemispheres. Electronically Signed   By: Claudie Revering M.D.   On: 07/13/2021 12:09   MR BRAIN W WO CONTRAST  Result Date: 07/14/2021 CLINICAL DATA:  HIV, PML EXAM: MRI HEAD WITHOUT AND WITH CONTRAST TECHNIQUE: Multiplanar, multiecho pulse sequences of the brain and surrounding structures were obtained without and with  intravenous contrast. CONTRAST:  33m GADAVIST GADOBUTROL 1 MMOL/ML IV SOLN COMPARISON:  06/15/2021 FINDINGS: Brain: T2 hyperintensity is present in the left frontoparietal lobes involving precentral and postcentral gyri and extending inferiorly in the frontoparietal white matter to the internal capsule and cerebral peduncle. Corresponding primarily facilitated diffusion with ADC isointensity at the periphery. There is patchy enhancement as before at the deep margin and this is confined to the dominant area of signal abnormality superiorly (abnormal signal extending inferiorly may reflect wallerian degeneration). Overall extent has increased since the prior study. There is no mass effect appreciated. No new abnormal signal separate from above. Additional patchy foci of T2 hyperintensity are again identified in cerebral white matter likely reflecting nonspecific gliosis/demyelination greater than expected for age. Vascular: Major vessel flow voids at the skull base are preserved. Skull and upper cervical spine: Normal marrow signal is preserved. Sinuses/Orbits: Paranasal sinuses are aerated. Orbits are unremarkable. Other: Sella is unremarkable.  Mastoid air cells are clear. IMPRESSION: Increased extent of left frontoparietal abnormal signal with enhancement at the deep margin as before. Appearance is consistent with reported biopsy result of PML. No new abnormal signal or enhancement elsewhere in the brain. Electronically Signed   By: PMacy MisM.D.   On: 07/14/2021 15:07    Procedures .Critical Care Performed by: NVarney Biles MD Authorized by: NVarney Biles MD   Critical care provider statement:    Critical care time (minutes):  35   Critical care was necessary to treat or prevent imminent or life-threatening deterioration of the following conditions:  CNS failure or compromise   Critical care was time spent personally by me on the following activities:  Development of treatment plan with  patient or surrogate, discussions with consultants, evaluation of patient's response to treatment, examination of patient, ordering and review of laboratory studies, ordering and review of radiographic studies, ordering and performing treatments and interventions, pulse oximetry, re-evaluation of patient's condition and review of old charts    Medications Ordered in ED Medications  azithromycin (ZITHROMAX) tablet 500 mg (500 mg Oral Given 07/13/21 2252)  bictegravir-emtricitabine-tenofovir AF (BIKTARVY) 50-200-25 MG per tablet 1 tablet (1 tablet Oral Given 07/14/21 1104)  ethambutol (MYAMBUTOL) tablet 400 mg (400 mg Oral Given 07/14/21 1104)  fluticasone furoate-vilanterol (BREO ELLIPTA) 100-25 MCG/ACT 1 puff (1 puff Inhalation Given 07/14/21 0818)  mirtazapine (REMERON) tablet 7.5 mg (7.5 mg Oral Given 07/13/21 2251)  QUEtiapine (SEROQUEL) tablet 200 mg (200 mg Oral Given 07/13/21 2251)  sulfamethoxazole-trimethoprim (BACTRIM DS) 800-160 MG per tablet 1 tablet (1 tablet Oral Given 07/14/21 1104)  0.9 %  sodium chloride infusion ( Intravenous New Bag/Given 07/14/21 0522)  enoxaparin (LOVENOX) injection 40 mg (40 mg Subcutaneous Given 07/14/21 1104)  oxyCODONE (Oxy IR/ROXICODONE) immediate release tablet 5 mg (5 mg Oral Given 07/14/21 1535)  lidocaine (XYLOCAINE) 2 % (with pres) injection 200 mg (20 mg Infiltration Given 07/13/21 1613)  sodium chloride 0.9 % bolus 1,000 mL (0 mLs Intravenous Stopped 07/13/21 1745)  gadobutrol (GADAVIST) 1 MMOL/ML injection 6 mL (6 mLs Intravenous Contrast Given 07/13/21 1751)  LORazepam (ATIVAN) injection 0.5 mg (0.5 mg Intravenous Given 07/14/21 1359)  sodium chloride 0.9 % bolus 500 mL (500 mLs Intravenous New Bag/Given 07/14/21 0112)    ED Course/ Medical Decision Making/ A&P                           Medical Decision Making Amount and/or Complexity of Data Reviewed Labs: ordered. Radiology: ordered.  Risk Decision regarding hospitalization.   This patient presents to the ED  with chief complaint(s) of worsening functional status.  Specifically is having right-sided weakness, right-sided numbness, slurred speech, vision changes with pertinent past medical history of HIV AIDS and a diagnosis of PML which further complicates the presenting complaint. The complaint involves an extensive differential diagnosis and treatment options and also carries with it a high risk of complications and morbidity.    The differential diagnosis includes : Opportunistic infection of the brain including CMV, toxo, worsening PML, new stroke.  The initial plan is to : Reviewed patient's work-up from previous admission and discussed the case with infectious disease doctors.  We will order basic labs and a CT scan of the brain in the interim.   Social Determinants of Health: Patients impaired access to primary care and unable to take care for ADL   increases the complexity of managing their presentation  Additional history obtained: Additional history obtained from family Records reviewed Care Everywhere/External Records  Reassessment and review: Lab Tests: I Ordered, and personally interpreted labs.  The pertinent results include: CBC, metabolic profile -which reveals elevated alk phos, AST and ALT.  CBC is normal.  Imaging Studies ordered: I independently visualized and interpreted the following imaging CT scan of the brain   which showed no evidence of acute brain bleed. I agree with the radiologist interpretation    Reevaluation of the patient after these medicines showed that the patient    stayed the same  Consultations Obtained: I requested consultation with the admitting physician -hospitalist to admit the patient and consultant -infectious disease physician, who will put in the recommendations as well .  Complexity of problems addressed: Patients presentation is most consistent with  acute presentation with potential threat to life or bodily function During patient's  assessment  Disposition: After consideration of the diagnostic results and the patients response to treatment,  I feel that the patent would benefit from admission to medicine.  At the request of  ID doctor - Dr. Darl Householder will complete the LP .   Final Clinical Impression(s) / ED Diagnoses Final diagnoses:  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[place])    Rx / DC Orders ED Discharge Orders     None         Varney Biles, MD 07/14/21 [phone]

## 2021-07-14 NOTE — Progress Notes (Signed)
°   07/13/21 2246  Assess: MEWS Score  Temp 98 F (36.7 C)  BP 111/85  Pulse Rate (!) 117  Resp 18  SpO2 96 %  O2 Device Room Air  Assess: MEWS Score  MEWS Temp 0  MEWS Systolic 0  MEWS Pulse 2  MEWS RR 0  MEWS LOC 0  MEWS Score 2  MEWS Score Color Yellow  Assess: if the MEWS score is Yellow or Red  Were vital signs taken at a resting state? Yes  Focused Assessment Change from prior assessment (see assessment flowsheet)  Does the patient meet 2 or more of the SIRS criteria? Yes  Does the patient have a confirmed or suspected source of infection? Yes  Provider and Rapid Response Notified? Yes  MEWS guidelines implemented *See Row Information* Yes  Treat  MEWS Interventions Administered scheduled meds/treatments  Take Vital Signs  Increase Vital Sign Frequency  Yellow: Q 2hr X 2 then Q 4hr X 2, if remains yellow, continue Q 4hrs  Escalate  MEWS: Escalate Yellow: discuss with charge nurse/RN and consider discussing with provider and RRT  Notify: Charge Nurse/RN  Name of Charge Nurse/RN Notified [place]  Date Charge Nurse/RN Notified 07/13/21  Time Charge Nurse/RN Notified 2326  Notify: Provider  Provider Name/Title Garner Nash  Date Provider Notified 07/13/21  Time Provider Notified 2327  Notification Type Page  Notification Reason Change in status  Provider response No new orders  Date of Provider Response 07/13/21  Notify: Rapid Response  Name of Rapid Response RN Notified Garner Nash  Date Rapid Response Notified 07/13/21  Time Rapid Response Notified 2348  Document  Patient Outcome Other (Comment) (unchanged)  Assess: SIRS CRITERIA  SIRS Temperature  0  SIRS Pulse 1  SIRS Respirations  0  SIRS WBC 0  SIRS Score Sum  1

## 2021-07-14 NOTE — Progress Notes (Signed)
PROGRESS NOTE    Kurt Foley  GEZ:[id number] DOB: April 10, 1976 DOA: 07/13/2021 PCP: Patient, No Pcp Per (Inactive)    Chief Complaint  Patient presents with   Leg Pain    Brief Narrative:   Kurt Foley is a 46 y.o. 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, he progressively got worse with increased weakness   Subjective:  Appear weak, reported low back pain  Assessment & Plan:   Principal Problem:   FTT (failure to thrive) in adult Active Problems:   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[place])   AIDS (acquired immune deficiency syndrome) ([place])   History of cardioembolic cerebrovascular accident (CVA)   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HCC)   CVA (cerebral vascular accident) ([place])   HIV/ AIDS/PML/disseminated MAC CD4 less than 35 ID consulted recommend continuing Biktarvy, Bactrim for P JP prophylaxis, continue azithromycin/ethambutol for disseminated MAC Mri brain Status post LP in the ED Seen by ophthalmology Dr. Eulas Post, patient enema and dilated fundus exam, no acute findings detail please refer to original consult notes by Dr. Eulas Post on 2/1 Management per ID , appreciate ID input    History of CVA with right hemiparesis Hold aspirin for now, just received LP Hold statin due to elevation of LFT   Hyponatremia Reported poor appetite, appears dehydrated Continue gentle hydration   FTT, poor prognosis Palliative care consulted      Unresulted Labs (From admission, onward)     Start     Ordered   07/14/21 0500  Histoplasma antigen, urine  Tomorrow morning,   R        07/13/21 1656   07/14/21 0500  Blastomyces Antigen  Tomorrow morning,   R        07/13/21 1656   07/13/21 1550  Miscellaneous [facility] test (send-out)  Once,   R       Question:  Test name  / description:  Answer:  csf meningoencephalitis pcr   07/13/21 1549   07/13/21 1548  VDRL, CSF  ([facility] ED ADULT CSF PANEL)  ONCE - STAT,   STAT        07/13/21 1547   07/13/21 1546  CMV dna by pcr, qualitative  Once,   R        07/13/21 1546   07/13/21 1546  Acid Fast Smear (AFB)  (AFB smear + Culture w reflexed sensitivities panel)  Once,   R       See Hyperspace for full Linked Orders Report.   07/13/21 1546   07/13/21 1546  Acid Fast Culture with reflexed sensitivities  (AFB smear + Culture w reflexed sensitivities panel)  Once,   R       See Hyperspace for full Linked Orders Report.   07/13/21 1546   07/13/21 1545  JC virus, PRC CSF  Once,   R        07/13/21 1546   07/13/21 1544  Epstein barr vrs(ebv dna by pcr)  Once,   R        07/13/21 1546              DVT prophylaxis: enoxaparin (LOVENOX) injection 40 mg Start: 07/14/21 1000 SCDs Start: 07/13/21 2038   Code Status: No intubation, partial code Family Communication: Mother over the phone Disposition:   Status is: Inpatient   Dispo: The patient is from:  Home              Anticipated d/c is to: TBD              Anticipated d/c date is: TBD                 Objective: Vitals:   07/14/21 1059 07/14/21 1514 07/14/21 1629 07/14/21 1719  BP: 111/87 116/84 107/85 115/82  Pulse: (!) 104 (!) 114 (!) 115 (!) 114  Resp: 16 (!) 32 (!) 35 (!) 28  Temp: 98.9 F (37.2 C) 98.3 F (36.8 C) 98.4 F (36.9 C) 98.9 F (37.2 C)  TempSrc: Oral Oral Oral Oral  SpO2: 99% 100% 100% 100%    Intake/Output Summary (Last 24 hours) at 07/14/2021 1803 Last data filed at 07/14/2021 1642 Gross per 24 hour  Intake 971.1 ml  Output 400 ml  Net 571.1 ml   There were no vitals filed for this visit.  Examination:  General exam: Very frail and weak, chronic ill-appearing , AAOx3  Respiratory system: Clear to auscultation. Respiratory effort normal. Cardiovascular system:  RRR.  Gastrointestinal system: Abdomen is nondistended, soft  and nontender.  Normal bowel sounds heard. Central nervous system: Alert and oriented.  Right hemiplegia Extremities:  no edema Skin: No rashes, lesions or ulcers Psychiatry: Calm and cooperative.     Data Reviewed: I have personally reviewed following labs and imaging studies  CBC: Recent Labs  Lab 07/13/21 1208 07/14/21 0530  WBC 4.3 3.9*  NEUTROABS 3.2  --   HGB 10.2* 9.0*  HCT 31.8* 27.4*  MCV 88.3 87.3  PLT 542* 405*    Basic Metabolic Panel: Recent Labs  Lab 07/08/21 1215 07/13/21 1208 07/14/21 0530  NA 132* 130* 129*  K 4.4 4.7 4.4  CL 101 102 106  CO2 18* 19* 16*  GLUCOSE 83 97 101*  BUN _0 CREATININE 1.00 1.05 0.98  CALCIUM 9.9 10.1 9.3    GFR: Estimated Creatinine Clearance: 68.1 mL/min (by C-G formula based on SCr of 0.98 mg/dL).  Liver Function Tests: Recent Labs  Lab 07/08/21 1215 07/13/21 1208 07/14/21 0530  AST 71* 80* 75*  ALT 89* 91* 83*  ALKPHOS  --  244* 193*  BILITOT 0.2 0.4 0.2*  PROT 8.6* 10.1* 8.1  ALBUMIN  --  4.0 3.2*    CBG: No results for input(s): GLUCAP in the last 168 hours.   Recent Results (from the past 240 hour(s))  CSF culture     Status: None (Preliminary result)   Collection Time: 07/13/21  4:11 PM   Specimen: CSF; Cerebrospinal Fluid  Result Value Ref Range Status   Specimen Description   Final    CSF Performed at [place] [address] [place], [place] [address]    Special Requests   Final    NONE Performed at [facility], [place] [address] [place], [place] [address]    Gram Stain   Final    NO WBC SEEN NO ORGANISMS SEEN CYTOSPIN SMEAR Gram Stain Report Called to,Read Back By and Verified With: H.YOUNT, RN AT 1826 ON 02.01.23 BY N.THOMPSON    Culture PENDING  Incomplete   Report Status PENDING  Incomplete  Culture, fungus without smear     Status: None (Preliminary result)   Collection Time: 07/13/21  4:11 PM   Specimen: CSF; Other  Result Value  Ref Range Status   Specimen Description   Final    CSF Performed at  [facility], [place] [address] [place], [place] [address]    Special Requests   Final    NONE Performed at [facility], [place] [address] [place], [place] [address]    Culture   Final    NO FUNGUS ISOLATED AFTER 1 DAY Performed at [facility], [place] [address], [place], [place] [address]    Report Status PENDING  Incomplete  Resp Panel by RT-PCR (Flu A&B, Covid) Nasopharyngeal Swab     Status: None   Collection Time: 07/13/21  4:17 PM   Specimen: Nasopharyngeal Swab; Nasopharyngeal(NP) swabs in vial transport medium  Result Value Ref Range Status   SARS Coronavirus 2 by RT PCR NEGATIVE NEGATIVE Final    Comment: (NOTE) SARS-CoV-2 target nucleic acids are NOT DETECTED.  The SARS-CoV-2 RNA is generally detectable in upper respiratory specimens during the acute phase of infection. The lowest concentration of SARS-CoV-2 viral copies this assay can detect is 138 copies/mL. A negative result does not preclude SARS-Cov-2 infection and should not be used as the sole basis for treatment or other patient management decisions. A negative result may occur with  improper specimen collection/handling, submission of specimen other than nasopharyngeal swab, presence of viral mutation(s) within the areas targeted by this assay, and inadequate number of viral copies(<138 copies/mL). A negative result must be combined with clinical observations, patient history, and epidemiological information. The expected result is Negative.  Fact Sheet for Patients:  EntrepreneurPulse.com.au  Fact Sheet for Healthcare Providers:  IncredibleEmployment.be  This test is no t yet approved or cleared by the [place] FDA and  has been authorized for detection and/or diagnosis of SARS-CoV-2 by FDA under an Emergency Use Authorization (EUA). This EUA will remain  in  effect (meaning this test can be used) for the duration of the COVID-19 declaration under Section 564(b)(1) of the Act, 21 U.S.C.section 360bbb-3(b)(1), unless the authorization is terminated  or revoked sooner.       Influenza A by PCR NEGATIVE NEGATIVE Final   Influenza B by PCR NEGATIVE NEGATIVE Final    Comment: (NOTE) The Xpert Xpress SARS-CoV-2/FLU/RSV plus assay is intended as an aid in the diagnosis of influenza from Nasopharyngeal swab specimens and should not be used as a sole basis for treatment. Nasal washings and aspirates are unacceptable for Xpert Xpress SARS-CoV-2/FLU/RSV testing.  Fact Sheet for Patients: EntrepreneurPulse.com.au  Fact Sheet for Healthcare Providers: IncredibleEmployment.be  This test is not yet approved or cleared by the [place] FDA and has been authorized for detection and/or diagnosis of SARS-CoV-2 by FDA under an Emergency Use Authorization (EUA). This EUA will remain in effect (meaning this test can be used) for the duration of the COVID-19 declaration under Section 564(b)(1) of the Act, 21 U.S.C. section 360bbb-3(b)(1), unless the authorization is terminated or revoked.  Performed at [facility], [place] [address] [place], [place] [address]          Radiology Studies: CT Head Wo Contrast  Result Date: 07/13/2021 CLINICAL DATA:  Neuro deficit. History of age and previous stroke. Reported recent brain biopsy at [facility] demonstrating PML. EXAM: CT HEAD WITHOUT CONTRAST TECHNIQUE: Contiguous axial images were obtained from the base of the skull through the vertex without intravenous contrast. RADIATION DOSE REDUCTION: This exam was performed according to the departmental dose-optimization program which includes automated exposure control, adjustment of the mA and/or kV according to patient size and/or use of iterative reconstruction technique. COMPARISON:  06/14/2021 and brain  MR dated 06/15/2021 FINDINGS: Brain: No significant change in previously demonstrated confluent white matter low density in the subcortical pre central and postcentral regions on the left. No significant change in mild patchy white matter low density elsewhere in both cerebral hemispheres and old left thalamic infarct extending inferiorly into the anterior pons on the left. No intracranial hemorrhage, mass lesion or CT evidence of acute infarction. Vascular: No hyperdense vessel or unexpected calcification. Skull: Normal. Negative for fracture or focal lesion. Sinuses/Orbits: Unremarkable. Other: None. IMPRESSION: 1. No acute abnormality. 2. Stable confluent subcortical white matter low density in the left cerebral hemisphere compatible with the reported recently diagnosed PML. 3. Stable old left thalamic lacunar infarct and minimal chronic small vessel white matter ischemic changes in both cerebral hemispheres. Electronically Signed   By: Claudie Revering M.D.   On: 07/13/2021 12:09   MR BRAIN W WO CONTRAST  Result Date: 07/14/2021 CLINICAL DATA:  HIV, PML EXAM: MRI HEAD WITHOUT AND WITH CONTRAST TECHNIQUE: Multiplanar, multiecho pulse sequences of the brain and surrounding structures were obtained without and with intravenous contrast. CONTRAST:  61m GADAVIST GADOBUTROL 1 MMOL/ML IV SOLN COMPARISON:  06/15/2021 FINDINGS: Brain: T2 hyperintensity is present in the left frontoparietal lobes involving precentral and postcentral gyri and extending inferiorly in the frontoparietal white matter to the internal capsule and cerebral peduncle. Corresponding primarily facilitated diffusion with ADC isointensity at the periphery. There is patchy enhancement as before at the deep margin and this is confined to the dominant area of signal abnormality superiorly (abnormal signal extending inferiorly may reflect wallerian degeneration). Overall extent has increased since the prior study. There is no mass effect appreciated. No  new abnormal signal separate from above. Additional patchy foci of T2 hyperintensity are again identified in cerebral white matter likely reflecting nonspecific gliosis/demyelination greater than expected for age. Vascular: Major vessel flow voids at the skull base are preserved. Skull and upper cervical spine: Normal marrow signal is preserved. Sinuses/Orbits: Paranasal sinuses are aerated. Orbits are unremarkable. Other: Sella is unremarkable.  Mastoid air cells are clear. IMPRESSION: Increased extent of left frontoparietal abnormal signal with enhancement at the deep margin as before. Appearance is consistent with reported biopsy result of PML. No new abnormal signal or enhancement elsewhere in the brain. Electronically Signed   By: PMacy MisM.D.   On: 07/14/2021 15:07        Scheduled Meds:  azithromycin  500 mg Oral QHS   bictegravir-emtricitabine-tenofovir AF  1 tablet Oral Daily   enoxaparin (LOVENOX) injection  40 mg Subcutaneous Q24H   ethambutol  400 mg Oral Daily   fluticasone furoate-vilanterol  1 puff Inhalation Daily   mirtazapine  7.5 mg Oral QHS   QUEtiapine  200 mg Oral QHS   sulfamethoxazole-trimethoprim  1 tablet Oral Daily   Continuous Infusions:  sodium chloride 75 mL/hr at 07/14/21 0522     LOS: 0 days   Greater than 50% of this time was spent in counseling, explanation of diagnosis, planning of further management, and coordination of care.   Voice Recognition /Viviann Sparedictation system was used to create this note, attempts have been made to correct errors. Please contact the author with questions and/or clarifications.   FFlorencia Reasons MD PhD FACP [facility]  Available via Epic secure chat 7am-7pm for nonurgent issues Please page for urgent issues To page the attending provider between 7A-7P or the covering provider during after hours 7P-7A, please log into the web site [email or web address] and access using universal [place] password  for that web site. If  you do not have the password, please call the hospital operator.    07/14/2021, 6:03 PM

## 2021-07-14 NOTE — Progress Notes (Signed)
°   07/14/21 0046  Assess: MEWS Score  Temp (!) 97.5 F (36.4 C)  BP 90/72  Pulse Rate (!) 125  Resp 16  SpO2 98 %  O2 Device Room Air  Assess: MEWS Score  MEWS Temp 0  MEWS Systolic 1  MEWS Pulse 2  MEWS RR 0  MEWS LOC 0  MEWS Score 3  MEWS Score Color Yellow  Assess: if the MEWS score is Yellow or Red  Were vital signs taken at a resting state? Yes  Focused Assessment Change from prior assessment (see assessment flowsheet)  Does the patient meet 2 or more of the SIRS criteria? Yes  Does the patient have a confirmed or suspected source of infection? Yes  Provider and Rapid Response Notified? Yes  MEWS guidelines implemented *See Row Information* Yes  Treat  MEWS Interventions Escalated (See documentation below)  Take Vital Signs  Increase Vital Sign Frequency  Yellow: Q 2hr X 2 then Q 4hr X 2, if remains yellow, continue Q 4hrs  Escalate  MEWS: Escalate Yellow: discuss with charge nurse/RN and consider discussing with provider and RRT  Notify: Charge Nurse/RN  Name of Charge Nurse/RN Notified [place]  Date Charge Nurse/RN Notified 07/14/21  Time Charge Nurse/RN Notified 0058  Notify: Provider  Provider Name/Title Garner Nash  Date Provider Notified 07/14/21  Time Provider Notified [phone]  Notification Type Page  Notification Reason Change in status  Date of Provider Response 07/14/21  Time of Provider Response 0058  Notify: Rapid Response  Name of Rapid Response RN Notified Garner Nash  Date Rapid Response Notified 07/14/21  Time Rapid Response Notified 0059  Document  Patient Outcome Stabilized after interventions  Progress note created (see row info) Yes  Assess: SIRS CRITERIA  SIRS Temperature  0  SIRS Pulse 1  SIRS Respirations  0  SIRS WBC 0  SIRS Score Sum  1

## 2021-07-14 NOTE — Plan of Care (Signed)

## 2021-07-15 ENCOUNTER — Inpatient Hospital Stay (HOSPITAL_COMMUNITY): Payer: Medicaid Other

## 2021-07-15 DIAGNOSIS — E440 Moderate protein-calorie malnutrition: Secondary | ICD-10-CM | POA: Insufficient documentation

## 2021-07-15 LAB — URINALYSIS, ROUTINE W REFLEX MICROSCOPIC
Bilirubin Urine: NEGATIVE
Glucose, UA: NEGATIVE mg/dL
Hgb urine dipstick: NEGATIVE
Ketones, ur: NEGATIVE mg/dL
Leukocytes,Ua: NEGATIVE
Nitrite: NEGATIVE
Specific Gravity, Urine: >1.03 — ABNORMAL HIGH (ref 1.005–1.030)
pH: 6 (ref 5.0–8.0)

## 2021-07-15 LAB — COMPREHENSIVE METABOLIC PANEL
ALT: 82 U/L — ABNORMAL HIGH (ref 0–44)
AST: 76 U/L — ABNORMAL HIGH (ref 15–41)
Albumin: 3.4 g/dL — ABNORMAL LOW (ref 3.5–5.0)
Alkaline Phosphatase: 196 U/L — ABNORMAL HIGH (ref 38–126)
Anion gap: 10 (ref 5–15)
BUN: 13 mg/dL (ref 6–20)
CO2: 18 mmol/L — ABNORMAL LOW (ref 22–32)
Calcium: 9.4 mg/dL (ref 8.9–10.3)
Chloride: 99 mmol/L (ref 98–111)
Creatinine, Ser: 1.04 mg/dL (ref 0.61–1.24)
GFR, Estimated: >60 mL/min (ref >60)
Glucose, Bld: 90 mg/dL (ref 70–99)
Potassium: 4.2 mmol/L (ref 3.5–5.1)
Sodium: 127 mmol/L — ABNORMAL LOW (ref 135–145)
Total Bilirubin: 0.2 mg/dL — ABNORMAL LOW (ref 0.3–1.2)
Total Protein: 8.1 g/dL (ref 6.5–8.1)

## 2021-07-15 LAB — CYTOLOGY - NON PAP

## 2021-07-15 LAB — VDRL, CSF: VDRL Quant, CSF: NONREACTIVE

## 2021-07-15 MED ORDER — ADULT MULTIVITAMIN W/MINERALS CH
1.0000 | ORAL_TABLET | Freq: Every day | ORAL | Status: DC
  Administered 2021-07-16 – 2021-07-20 (×5): 1 via ORAL
  Filled 2021-07-15 (×5): qty 1

## 2021-07-15 MED ORDER — OXYCODONE HCL 5 MG PO TABS
10.0000 mg | ORAL_TABLET | Freq: Four times a day (QID) | ORAL | Status: DC | PRN
  Administered 2021-07-15 – 2021-07-18 (×7): 10 mg via ORAL
  Filled 2021-07-15 (×7): qty 2

## 2021-07-15 MED ORDER — ENSURE ENLIVE PO LIQD
237.0000 mL | Freq: Two times a day (BID) | ORAL | Status: DC
  Administered 2021-07-16 – 2021-07-20 (×9): 237 mL via ORAL

## 2021-07-15 MED ORDER — POLYETHYLENE GLYCOL 3350 17 G PO PACK
17.0000 g | PACK | Freq: Every day | ORAL | Status: DC
  Administered 2021-07-15 – 2021-07-20 (×5): 17 g via ORAL
  Filled 2021-07-15 (×6): qty 1

## 2021-07-15 MED ORDER — SODIUM CHLORIDE 0.9 % IV SOLN: INTRAVENOUS | Status: AC

## 2021-07-15 MED ORDER — GADOBUTROL 1 MMOL/ML IV SOLN
5.0000 mL | Freq: Once | INTRAVENOUS | Status: AC | PRN
  Administered 2021-07-15: 5 mL via INTRAVENOUS

## 2021-07-15 MED ORDER — LORAZEPAM 2 MG/ML IJ SOLN
1.0000 mg | Freq: Once | INTRAMUSCULAR | Status: AC
  Administered 2021-07-15: 1 mg via INTRAVENOUS
  Filled 2021-07-15 (×2): qty 1

## 2021-07-15 MED ORDER — SENNOSIDES-DOCUSATE SODIUM 8.6-50 MG PO TABS
1.0000 | ORAL_TABLET | Freq: Two times a day (BID) | ORAL | Status: DC
  Administered 2021-07-15 – 2021-07-20 (×7): 1 via ORAL
  Filled 2021-07-15 (×9): qty 1

## 2021-07-15 NOTE — Progress Notes (Signed)
? ?  Inpatient Rehab Admissions Coordinator : ? ?Per therapy recommendations, patient was screened for CIR candidacy by Teana Lindahl RN MSN.  At this time patient appears to be a potential candidate for CIR. I will place a rehab consult per protocol for full assessment. Please call me with any questions. ? ?Lonna Rabold RN MSN ?Admissions Coordinator ?[phone] ?  ?

## 2021-07-15 NOTE — Progress Notes (Signed)
Initial Nutrition Assessment  DOCUMENTATION CODES:   Non-severe (moderate) malnutrition in context of chronic illness  INTERVENTION:   -Ensure Plus High Protein po BID, each supplement provides 350 kcal and 20 grams of protein.   -Multivitamin with minerals daily  -Placed "High Calorie, High Protein" handout in discharge instructions  NUTRITION DIAGNOSIS:   Moderate Malnutrition related to chronic illness (HIV/AIDS) as evidenced by energy intake < or equal to 75% for > or equal to 1 month, moderate fat depletion, mild muscle depletion.  GOAL:   Patient will meet greater than or equal to 90% of their needs  MONITOR:   PO intake, Supplement acceptance, Labs, Weight trends, I & O's  REASON FOR ASSESSMENT:   Consult Assessment of nutrition requirement/status  ASSESSMENT:   46 y.o. 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  Awaiting MRI. Has not eaten today.  Pt with history of CVA, right sided hemiparesis, making it difficult to walk. Pt able to provide history but with delayed answers. Pt reports poor appetite and only eating ~1 meal a day or eating sporadically throughout the day. Denies any issues with chewing or swallowing. Reviewed increasing protein in diet to help maintain muscle mass and strength.  Pt has had Ensure in the past and likes them. Will order in strawberry flavor.   Per pt his UBW is 126 lbs.  Weight from 1/27: 113 lbs  Medications: Remeron, Miralax, senokot  Labs reviewed:  Low Na  NUTRITION - FOCUSED PHYSICAL EXAM:  Flowsheet Row Most Recent Value  Orbital Region Mild depletion  Upper Arm Region Moderate depletion  Thoracic and Lumbar Region Unable to assess  Buccal Region Moderate  depletion  Temple Region Moderate depletion  Clavicle Bone Region Mild depletion  Clavicle and Acromion Bone Region Mild depletion  Scapular Bone Region Mild depletion  Dorsal Hand Mild depletion  Patellar Region Mild depletion  Anterior Thigh Region Mild depletion  Posterior Calf Region Mild depletion  Edema (RD Assessment) None  Hair Reviewed  Eyes Reviewed  Mouth Reviewed  [missing teeth]  Skin Reviewed  [dry]  Nails Reviewed       Diet Order:   Diet Order             Diet regular Room service appropriate? Yes; Fluid consistency: Thin  Diet effective now                   EDUCATION NEEDS:   Education needs have been addressed  Skin:  Skin Assessment: Reviewed RN Assessment  Last BM:  2/2 -type 3  Height:   Ht Readings from Last 1 Encounters:  07/08/21 _0  (1.651 m)    Weight:   Wt Readings from Last 1 Encounters:  07/08/21 51.7 kg    BMI:  18.8 kg/m^2  Estimated Nutritional Needs:   Kcal:  1750-1950  Protein:  90-105g  Fluid:  1.9L/day  Clayton Bibles, MS, RD, LDN Inpatient Clinical Dietitian Contact information available via Amion

## 2021-07-15 NOTE — Discharge Instructions (Signed)

## 2021-07-15 NOTE — Progress Notes (Signed)
°   07/15/21 1200  Assess: MEWS Score  Temp 99.3 F (37.4 C)  BP 115/78  Pulse Rate (!) 114  Resp 18  SpO2 100 %  O2 Device Room Air  Assess: MEWS Score  MEWS Temp 0  MEWS Systolic 0  MEWS Pulse 2  MEWS RR 0  MEWS LOC 0  MEWS Score 2  MEWS Score Color Yellow  Assess: if the MEWS score is Yellow or Red  Were vital signs taken at a resting state? Yes  Focused Assessment No change from prior assessment  Does the patient meet 2 or more of the SIRS criteria? No  Does the patient have a confirmed or suspected source of infection? No  Provider and Rapid Response Notified? No  Treat  Pain Scale 0-10  Pain Score 0  Take Vital Signs  Increase Vital Sign Frequency  Yellow: Q 2hr X 2 then Q 4hr X 2, if remains yellow, continue Q 4hrs  Escalate  MEWS: Escalate Yellow: discuss with charge nurse/RN and consider discussing with provider and RRT  Notify: Charge Nurse/RN  Name of Charge Nurse/RN Notified Britta Mccreedy, RN  Date Charge Nurse/RN Notified 07/15/21  Time Charge Nurse/RN Notified 1215  Document  Patient Outcome Other (Comment) (unchanged)  Progress note created (see row info) Yes  Assess: SIRS CRITERIA  SIRS Temperature  0  SIRS Pulse 1  SIRS Respirations  0  SIRS WBC 0  SIRS Score Sum  1

## 2021-07-15 NOTE — Progress Notes (Addendum)
PROGRESS NOTE    DONEL OSOWSKI  VWP:[id number] DOB: [birth year] DOA: 07/13/2021 PCP: Patient, No Pcp Per (Inactive)    Chief Complaint  Patient presents with   Leg Pain    Brief Narrative:   Kurt Foley is a 46 y.o. 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, he progressively got worse with increased weakness   Subjective:  Reports had lower back pain at home and could not walk , can not tell me how long the pain has been there, but the pain stays there , does not travel down to legs, denies bowel and bladder incontinence , no numbness or tingling in legs  want percocet 14m for the pain  I  recommended mri of spine because of the pain and inability to walk  to rule out spinal cord issue, He does not think he has issue with his  spinal cord, but reluctantly agreed to proceed with mri lumbar spine  Assessment & Plan:   Principal Problem:   FTT (failure to thrive) in adult Active Problems:   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[facility]   AIDS (acquired immune deficiency syndrome) ([facility]   History of cardioembolic cerebrovascular accident (CVA)   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[facility]   CVA (cerebral vascular accident) ([facility]   HIV/ AIDS/PML/disseminated MAC -CD4 less than 35 -ID consulted recommend continuing Biktarvy, Bactrim for P JP prophylaxis, continue azithromycin/ethambutol for disseminated MAC -ID recommended ophthalmology consult ,MRI brain and LP -Status post LP in the ED, ID will follow-up on test result -MRI brain findings is consistent with reported biopsy result of PML. No new abnormal signal or enhancement elsewhere in the brain." -Seen by ophthalmology Dr. GEulas Post patient enema and dilated fundus exam, no acute findings detail please refer to original consult  notes by Dr. GEulas Poston 2/1 -Continue have low grade intermittent fever, most likely from  AIDS/MAC per phone conversation with ID on 2/3, I added blood culture and ua , will follow result -Management per ID , appreciate ID input  Low back pain/inability to continue to walk -Does have tenderness to low back on exam -get MRI lumbar spine with without contrast, case discussed with ID as well    History of CVA with right hemiparesis Resume ASA after lumbar spine Hold statin due to elevation of LFT   Hyponatremia Reported poor appetite, appears dehydrated Continue gentle hydration  LFT elevation Check CK, hepatitis panel, abdominal ultrasound Hold statin for now   FTT, poor prognosis Need 2 person assist to get out of bed, agreed to proceed with SNF placement Palliative care consulted               Unresulted Labs (From admission, onward)     Start     Ordered   07/16/21 0500  Cortisol  Tomorrow morning,   R        07/15/21 1302   07/15/21 0829  Urinalysis, Routine w reflex microscopic  Once,   R        07/15/21 0828   07/15/21 0829  Urine Culture  Once,   R       Question:  Indication  Answer:  Altered mental status (if no other cause identified)   07/15/21 0828   07/15/21 0500  Comprehensive metabolic panel  Daily,   R      07/14/21 1812  07/14/21 0500  Histoplasma antigen, urine  Tomorrow morning,   R        07/13/21 1656   07/14/21 0500  Blastomyces Antigen  Tomorrow morning,   R        07/13/21 1656   07/13/21 1550  Miscellaneous [facility] test (send-out)  Once,   R       Question:  Test name / description:  Answer:  csf meningoencephalitis pcr   07/13/21 1549   07/13/21 1548  VDRL, CSF  ([facility] ED ADULT CSF PANEL)  ONCE - STAT,   STAT        07/13/21 1547   07/13/21 1546  CMV dna by pcr, qualitative  Once,   R        07/13/21 1546   07/13/21 1546  Acid Fast Smear (AFB)  (AFB smear + Culture w reflexed sensitivities panel)  Once,   R       See Hyperspace for full  Linked Orders Report.   07/13/21 1546   07/13/21 1546  Acid Fast Culture with reflexed sensitivities  (AFB smear + Culture w reflexed sensitivities panel)  Once,   R       See Hyperspace for full Linked Orders Report.   07/13/21 1546   07/13/21 1545  JC virus, PRC CSF  Once,   R        07/13/21 1546   07/13/21 1544  Epstein barr vrs(ebv dna by pcr)  Once,   R        07/13/21 1546              DVT prophylaxis: enoxaparin (LOVENOX) injection 40 mg Start: 07/14/21 1000 SCDs Start: 07/13/21 2038   Code Status: No intubation, partial code Family Communication: Mother over the phone on 2/2 Disposition:   Status is: Inpatient   Dispo: The patient is from: Home              Anticipated d/c is to: SNF              Anticipated d/c date is: TBD                 Objective: Vitals:   07/15/21 0343 07/15/21 0506 07/15/21 0817 07/15/21 1200  BP: 101/72 102/74  115/78  Pulse: (!) 112 (!) 105  (!) 114  Resp: 18   18  Temp: 100.2 F (37.9 C) 99.1 F (37.3 C)  99.3 F (37.4 C)  TempSrc: Oral Oral  Oral  SpO2: 97% 96% 96% 100%    Intake/Output Summary (Last 24 hours) at 07/15/2021 1501 Last data filed at 07/14/2021 2100 Gross per 24 hour  Intake 456 ml  Output 250 ml  Net 206 ml   There were no vitals filed for this visit.  Examination:  General exam: Very frail and weak, chronic ill-appearing , AAOx3  Respiratory system: Clear to auscultation. Respiratory effort normal. Cardiovascular system:  RRR.  Gastrointestinal system: Abdomen is nondistended, soft and nontender.  Normal bowel sounds heard. Central nervous system: Alert and oriented.  Right hemiplegia Extremities:  no edema Skin: No rashes, lesions or ulcers Psychiatry: Calm and cooperative.     Data Reviewed: I have personally reviewed following labs and imaging studies  CBC: Recent Labs  Lab 07/13/21 1208 07/14/21 0530 07/14/21 1937  WBC 4.3 3.9* 4.2  NEUTROABS 3.2  --  2.8  HGB 10.2* 9.0* 9.9*  HCT  31.8* 27.4* 31.1*  MCV 88.3 87.3 88.4  PLT 542* 405* 396  Basic Metabolic Panel: Recent Labs  Lab 07/13/21 1208 07/14/21 0530 07/15/21 0455  NA 130* 129* 127*  K 4.7 4.4 4.2  CL 102 106 99  CO2 19* 16* 18*  GLUCOSE 97 101* 90  BUN _0 CREATININE 1.05 0.98 1.04  CALCIUM 10.1 9.3 9.4    GFR: Estimated Creatinine Clearance: 64.2 mL/min (by C-G formula based on SCr of 1.04 mg/dL).  Liver Function Tests: Recent Labs  Lab 07/13/21 1208 07/14/21 0530 07/15/21 0455  AST 80* 75* 76*  ALT 91* 83* 82*  ALKPHOS 244* 193* 196*  BILITOT 0.4 0.2* 0.2*  PROT 10.1* 8.1 8.1  ALBUMIN 4.0 3.2* 3.4*    CBG: No results for input(s): GLUCAP in the last 168 hours.   Recent Results (from the past 240 hour(s))  CSF culture     Status: None (Preliminary result)   Collection Time: 07/13/21  4:11 PM   Specimen: CSF; Cerebrospinal Fluid  Result Value Ref Range Status   Specimen Description   Final    CSF Performed at [facility] [address], [place], [place] [address]    Special Requests   Final    NONE Performed at [facility], [place] [address] [place], [place] [address]    Gram Stain   Final    NO WBC SEEN NO ORGANISMS SEEN CYTOSPIN SMEAR Gram Stain Report Called to,Read Back By and Verified With: H.YOUNT, RN AT 1826 ON 02.01.23 BY N.THOMPSON    Culture   Final    NO GROWTH 2 DAYS Performed at [facility], [place] [address] [place], [place] [address]    Report Status PENDING  Incomplete  Culture, fungus without smear     Status: None (Preliminary result)   Collection Time: 07/13/21  4:11 PM   Specimen: CSF; Other  Result Value Ref Range Status   Specimen Description   Final    CSF Performed at [facility] [address], [place], [place] [address]    Special Requests   Final    NONE Performed at [facility], [place] [address], [place], [place] [address]    Culture   Final     NO FUNGUS ISOLATED AFTER 2 DAYS Performed at [facility], [place] [address], [place],  [address]    Report Status PENDING  Incomplete  Resp Panel by RT-PCR (Flu A&B, Covid) Nasopharyngeal Swab     Status: None   Collection Time: 07/13/21  4:17 PM   Specimen: Nasopharyngeal Swab; Nasopharyngeal(NP) swabs in vial transport medium  Result Value Ref Range Status   SARS Coronavirus 2 by RT PCR NEGATIVE NEGATIVE Final    Comment: (NOTE) SARS-CoV-2 target nucleic acids are NOT DETECTED.  The SARS-CoV-2 RNA is generally detectable in upper respiratory specimens during the acute phase of infection. The lowest concentration of SARS-CoV-2 viral copies this assay can detect is 138 copies/mL. A negative result does not preclude SARS-Cov-2 infection and should not be used as the sole basis for treatment or other patient management decisions. A negative result may occur with  improper specimen collection/handling, submission of specimen other than nasopharyngeal swab, presence of viral mutation(s) within the areas targeted by this assay, and inadequate number of viral copies(<138 copies/mL). A negative result must be combined with clinical observations, patient history, and epidemiological information. The expected result is Negative.  Fact Sheet for Patients:  EntrepreneurPulse.com.au  Fact Sheet for Healthcare Providers:  IncredibleEmployment.be  This test  is no t yet approved or cleared by the [place] and  has been authorized for detection and/or diagnosis of SARS-CoV-2 by FDA under an Emergency Use Authorization (EUA). This EUA will remain  in effect (meaning this test can be used) for the duration of the COVID-19 declaration under Section 564(b)(1) of the Act, 21 U.S.C.section 360bbb-3(b)(1), unless the authorization is terminated  or revoked sooner.       Influenza A by PCR NEGATIVE NEGATIVE Final   Influenza B by PCR NEGATIVE  NEGATIVE Final    Comment: (NOTE) The Xpert Xpress SARS-CoV-2/FLU/RSV plus assay is intended as an aid in the diagnosis of influenza from Nasopharyngeal swab specimens and should not be used as a sole basis for treatment. Nasal washings and aspirates are unacceptable for Xpert Xpress SARS-CoV-2/FLU/RSV testing.  Fact Sheet for Patients: EntrepreneurPulse.com.au  Fact Sheet for Healthcare Providers: IncredibleEmployment.be  This test is not yet approved or cleared by the [place] FDA and has been authorized for detection and/or diagnosis of SARS-CoV-2 by FDA under an Emergency Use Authorization (EUA). This EUA will remain in effect (meaning this test can be used) for the duration of the COVID-19 declaration under Section 564(b)(1) of the Act, 21 U.S.C. section 360bbb-3(b)(1), unless the authorization is terminated or revoked.  Performed at [facility], [place] [address] [place], [place] [address]   Culture, blood (routine x 2)     Status: None (Preliminary result)   Collection Time: 07/14/21  9:01 PM   Specimen: BLOOD RIGHT HAND  Result Value Ref Range Status   Specimen Description   Final    BLOOD RIGHT HAND Performed at [facility] [address], [place], [place] [address]    Special Requests   Final    BOTTLES DRAWN AEROBIC ONLY Blood Culture adequate volume Performed at [facility] [address], [place], [place] [address]    Culture   Final    NO GROWTH < 12 HOURS Performed at [facility] [address] [place], [place] [address]    Report Status PENDING  Incomplete  Culture, blood (routine x 2)     Status: None (Preliminary result)   Collection Time: 07/14/21  9:01 PM   Specimen: BLOOD RIGHT WRIST  Result Value Ref Range Status   Specimen Description   Final    BLOOD RIGHT WRIST Performed at [facility] [address]  [place], [place] [address]    Special Requests   Final    BOTTLES DRAWN AEROBIC ONLY Blood Culture adequate volume Performed at [facility] [address] [place], [place] [address]    Culture   Final    NO GROWTH < 12 HOURS Performed at [facility] [address], [place], [place] [address]    Report Status PENDING  Incomplete         Radiology Studies: MR BRAIN W WO CONTRAST  Result Date: 07/14/2021 CLINICAL DATA:  HIV, PML EXAM: MRI HEAD WITHOUT AND WITH CONTRAST TECHNIQUE: Multiplanar, multiecho pulse sequences of the brain and surrounding structures were obtained without and with intravenous contrast. CONTRAST:  22m GADAVIST GADOBUTROL 1 MMOL/ML IV SOLN COMPARISON:  06/15/2021 FINDINGS: Brain: T2 hyperintensity is present in the left frontoparietal lobes involving precentral and postcentral gyri and extending inferiorly in the frontoparietal white matter to the internal capsule and cerebral peduncle. Corresponding primarily facilitated diffusion with ADC isointensity at the periphery. There is patchy enhancement as before  at the deep margin and this is confined to the dominant area of signal abnormality superiorly (abnormal signal extending inferiorly may reflect wallerian degeneration). Overall extent has increased since the prior study. There is no mass effect appreciated. No new abnormal signal separate from above. Additional patchy foci of T2 hyperintensity are again identified in cerebral white matter likely reflecting nonspecific gliosis/demyelination greater than expected for age. Vascular: Major vessel flow voids at the skull base are preserved. Skull and upper cervical spine: Normal marrow signal is preserved. Sinuses/Orbits: Paranasal sinuses are aerated. Orbits are unremarkable. Other: Sella is unremarkable.  Mastoid air cells are clear. IMPRESSION: Increased extent of left frontoparietal abnormal signal with enhancement at the deep margin as before.  Appearance is consistent with reported biopsy result of PML. No new abnormal signal or enhancement elsewhere in the brain. Electronically Signed   By: Macy Mis M.D.   On: 07/14/2021 15:07   DG CHEST PORT 1 VIEW  Result Date: 07/15/2021 CLINICAL DATA:  Fever. EXAM: PORTABLE CHEST 1 VIEW COMPARISON:  June 26, 2021. FINDINGS: The heart size and mediastinal contours are within normal limits. Stable right upper lobe opacity is noted most consistent with scarring. Emphysematous disease is noted bilaterally. Minimal left basilar subsegmental atelectasis or scarring is noted. The visualized skeletal structures are unremarkable. IMPRESSION: Minimal left basilar subsegmental atelectasis or scarring. Stable right upper lobe opacity is noted most consistent with scarring. Emphysema (ICD10-J43.9). Electronically Signed   By: Marijo Conception M.D.   On: 07/15/2021 09:46        Scheduled Meds:  azithromycin  500 mg Oral QHS   bictegravir-emtricitabine-tenofovir AF  1 tablet Oral Daily   enoxaparin (LOVENOX) injection  40 mg Subcutaneous Q24H   ethambutol  400 mg Oral Daily   fluticasone furoate-vilanterol  1 puff Inhalation Daily   LORazepam  1 mg Intravenous Once   mirtazapine  7.5 mg Oral QHS   polyethylene glycol  17 g Oral Daily   QUEtiapine  200 mg Oral QHS   senna-docusate  1 tablet Oral BID   sulfamethoxazole-trimethoprim  1 tablet Oral Daily   Continuous Infusions:  sodium chloride       LOS: 1 day   Greater than 50% of this time was spent in counseling, explanation of diagnosis, planning of further management, and coordination of care.   Voice Recognition Viviann Spare dictation system was used to create this note, attempts have been made to correct errors. Please contact the author with questions and/or clarifications.   Florencia Reasons, MD PhD FACP [facility]  Available via Epic secure chat 7am-7pm for nonurgent issues Please page for urgent issues To page the attending provider  between 7A-7P or the covering provider during after hours 7P-7A, please log into the web site [email or web address] and access using universal [place] password for that web site. If you do not have the password, please call the hospital operator.    07/15/2021, 3:01 PM

## 2021-07-15 NOTE — Progress Notes (Signed)
Orthopedic Tech Progress Note Patient Details:  Kurt Foley 1975-11-24 [id number]  Ortho Devices Type of Ortho Device: Ankle Air splint Ortho Device/Splint Location: RLE Ortho Device/Splint Interventions: Application   Post Interventions Patient Tolerated: Well  [name] 07/15/2021, 5:54 PM

## 2021-07-15 NOTE — Evaluation (Signed)
Physical Therapy Evaluation Patient Details Name: Kurt Foley MRN: [id number] DOB: 1975/12/11 Today's Date: 07/15/2021  History of Present Illness  Patient admitted with progressive weakness and failure to thrive. 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 PMH significant for HIV/AIDS, CVA with R sided weakness, disseminated MAC, and depression.   Clinical Impression  Pt is a 46 y.o. male with above HPI resulting in the deficits listed below (see PT Problem List). Pt performed sit to stand transfers with MIN A for power up from EOB and recliner chair. Pt ambulated ~20f with MIN A and intermittent assist for RW management and R UE hand placement on RW due to hemiparesis from prior CVA. Pt demonstrated ataxic gait with varying step lengths, intermittent high steppage, and multiple episodes of R ankle plantarflexing and inverting and pt stepping on lateral aspect of foot, states this has caused falls at home before. Denies use of AFO/bracing at baseline. Will continue to follow for and assess mobility progression with current medical prognosis. Pt's stated goal is to "walk better, more normal" and be more independent with mobility. Pt will benefit from continued intensive skilled PT services to maximize safety with functional mobility and increase independence.        Recommendations for follow up therapy are one component of a multi-disciplinary discharge planning process, led by the attending physician.  Recommendations may be updated based on patient status, additional functional criteria and insurance authorization.  Follow Up Recommendations Acute inpatient rehab (3hours/day)    Assistance Recommended at Discharge Frequent or constant Supervision/Assistance  Patient can return home with the following  A little help with walking and/or transfers;A little help with bathing/dressing/bathroom;Assistance with  cooking/housework;Direct supervision/assist for medications management;Direct supervision/assist for financial management;Assist for transportation    Equipment Recommendations  (pt reports he has RW)  Recommendations for Other Services  Rehab consult;OT consult    Functional Status Assessment Patient has had a recent decline in their functional status and demonstrates the ability to make significant improvements in function in a reasonable and predictable amount of time.     Precautions / Restrictions Precautions Precautions: Fall Restrictions Weight Bearing Restrictions: No      Mobility  Bed Mobility Overal bed mobility: Modified Independent             General bed mobility comments: Pt with use of UEs to complete B LEs to EOB- increased time    Transfers Overall transfer level: Needs assistance Equipment used: Rolling walker (2 wheels) Transfers: Sit to/from Stand Sit to Stand: Min assist           General transfer comment: x2; MIN A with cues for hand placement. hand over hand facilitation for placement of R hand on RW.    Ambulation/Gait Ambulation/Gait assistance: Min assist, +2 safety/equipment Gait Distance (Feet): 50 Feet Assistive device: Rolling walker (2 wheels) Gait Pattern/deviations: Step-to pattern, Ataxic, Decreased stride length, Steppage, Narrow base of support Gait velocity: decr     General Gait Details: varying step length bilaterally, minimal heel strike, and intermittent high steppage pattern on R LE with multiple episodes of R ankle inverting and pt stepping on lateral aspect of foot, reports this has been going on for " a while" and is painful when happens and has caused falls at home. Denies use of any brace/orthotic on R LE to correct. Cues for increased heel strike/DF and safe proximity to RW, pt fluctuating between safe proximity and ambuating too far inside  of RW due to varying gait pattern. Pt able to self correct R hand placement on  RW using L hand when needed.  Increased fatigue/SOB following ambulation. close chair follow provided for safety.  Stairs            Wheelchair Mobility    Modified Rankin (Stroke Patients Only)       Balance Overall balance assessment: Needs assistance Sitting-balance support: Feet supported Sitting balance-Leahy Scale: Good     Standing balance support: Bilateral upper extremity supported, During functional activity Standing balance-Leahy Scale: Fair Standing balance comment: pt able to stand statically for toileting with close supervision for safety and  no UE support                             Pertinent Vitals/Pain Pain Assessment Pain Assessment: Faces Faces Pain Scale: Hurts little more Pain Location: generalized Pain Descriptors / Indicators: Sore Pain Intervention(s): Monitored during session, Limited activity within patient's tolerance, Repositioned    Home Living Family/patient expects to be discharged to:: Private residence Living Arrangements: Other relatives (aunt) Available Help at Discharge: Family Type of Home: House Home Access: Level entry       Home Layout: One level Home Equipment: Conservation officer, nature (2 wheels);Cane - single point Additional Comments: Aunt works during the day 4am-5pm. Pt states he has not been very mobile lately, uses RW at baseline. unclear of baseline cognitive status, no family present to determine.    Prior Function Prior Level of Function : Needs assist       Physical Assist : ADLs (physical)   ADLs (physical): Bathing;Dressing;Toileting Mobility Comments: States that he is in bed most of the day while aunt is at work, has attempted to be up on his own, reports history of fall- was trying to ambulate to bathroom.       Hand Dominance        Extremity/Trunk Assessment   Upper Extremity Assessment Upper Extremity Assessment: RUE deficits/detail RUE Deficits / Details: R hemiparesis- history of CVA.  Finger flexion contracture.    Lower Extremity Assessment Lower Extremity Assessment: RLE deficits/detail;LLE deficits/detail RLE Deficits / Details: 3 to 3+/5 throughout, difficulty maintaining neutral hip with MMT of  hip flexors- noted hip ER with active movement RLE Sensation: decreased proprioception LLE Deficits / Details: Grossly 4/5 throughout, except hip flexion 4-/5 LLE Sensation: decreased proprioception    Cervical / Trunk Assessment Cervical / Trunk Assessment: Normal  Communication   Communication: No difficulties  Cognition Arousal/Alertness: Awake/alert Behavior During Therapy: Flat affect Overall Cognitive Status: No family/caregiver present to determine baseline cognitive functioning                                 General Comments: Able to answer questions, somtimes repeating himself or taking increased time to respond or requiring repetition from therapist for clarification of home environment/PLOF questions. Oriented to self, place, time        General Comments      Exercises     Assessment/Plan    PT Assessment Patient needs continued PT services  PT Problem List Decreased strength;Decreased activity tolerance;Decreased balance;Decreased mobility;Decreased knowledge of use of DME;Pain;Impaired sensation       PT Treatment Interventions DME instruction;Gait training;Functional mobility training;Therapeutic activities;Therapeutic exercise;Balance training;Neuromuscular re-education;Patient/family education;Wheelchair mobility training    PT Goals (Current goals can be found in the Care Plan section)  Acute  Rehab PT Goals Patient Stated Goal: Want to do more therapy so I can walk better, more normal PT Goal Formulation: With patient Time For Goal Achievement: 07/29/21 Potential to Achieve Goals: Fair    Frequency Min 2X/week     Co-evaluation               AM-PAC PT "6 Clicks" Mobility  Outcome Measure Help needed turning  from your back to your side while in a flat bed without using bedrails?: None Help needed moving from lying on your back to sitting on the side of a flat bed without using bedrails?: A Little Help needed moving to and from a bed to a chair (including a wheelchair)?: A Little Help needed standing up from a chair using your arms (e.g., wheelchair or bedside chair)?: A Little Help needed to walk in hospital room?: A Little Help needed climbing 3-5 steps with a railing? : A Little 6 Click Score: 19    End of Session Equipment Utilized During Treatment: Gait belt Activity Tolerance: Patient tolerated treatment well Patient left: in chair;with call bell/phone within reach;with chair alarm set Nurse Communication: Mobility status PT Visit Diagnosis: Unsteadiness on feet (R26.81);Other abnormalities of gait and mobility (R26.89);History of falling (Z91.81);Muscle weakness (generalized) (M62.81);Ataxic gait (R26.0);Other symptoms and signs involving the nervous system (R29.898)    Time: 3614-4315 PT Time Calculation (min) (ACUTE ONLY): 26 min   Charges:   PT Evaluation $PT Eval Low Complexity: 1 Low PT Treatments $Therapeutic Activity: 8-22 mins        Festus Barren PT, DPT  Acute Rehabilitation Services  Office [phone]  07/15/2021, 3:45 PM

## 2021-07-15 NOTE — Consult Note (Signed)
Consultation Note Date: 07/15/2021   Patient Name: Kurt Foley  DOB: 07-07-1975  MRN: [id number]  Age / Sex: 46 y.o., male  PCP: Patient, No Pcp Per (Inactive) Referring Physician: Florencia Reasons, MD  Reason for Consultation: Establishing goals of care  HPI/Patient Profile: 46 y.o. 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 Patient admitted to hospital medicine service with increasing weakness, failure to thrive.  Seen and evaluated by ophthalmology as well as infectious disease, remains admitted to hospital medicine. Palliative care consultation for goals of care discussions has been requested. Patient is awake alert resting in bed.  He has slight facial droop.  2 family members who identified themselves as patient's hands are present at the bedside.  I introduced myself and palliative care as follows: Palliative medicine is specialized medical care for people living with serious illness. It focuses on providing relief from the symptoms and stress of a serious illness. The goal is to improve quality of life for both the patient and the family. Goals of care: Broad aims of medical therapy in relation to the patient's values and preferences. Our aim is to provide medical care aimed at enabling patients to achieve the goals that matter most to them, given the circumstances of their particular medical situation and their constraints.  Both family members present in the room or a little taken aback by my presence.  Reintroduced palliative care as an additional layer of support for discussing broad goals of care as well as symptom management. Patient and family asked to speak  with social worker as they are looking into rehab attempt for physical therapy for the patient.  Discussed briefly about issues pertaining to this hospitalization and patient's acute condition.  Offered active listening and supportive presence and other therapeutic techniques.  NEXT OF KIN Mother, aunts  SUMMARY OF RECOMMENDATIONS   Continue current mode of care Patient and family wish to pursue SNF rehab attempt with palliative care to follow on discharge Thank you for the consult.   Code Status/Advance Care Planning: Limited code   Symptom Management:     Palliative Prophylaxis:  Delirium Protocol  Additional Recommendations (Limitations, Scope, Preferences): Full Scope Treatment  Psycho-social/Spiritual:  Desire for further Chaplaincy support:yes Additional Recommendations: Caregiving  Support/Resources  Prognosis:  Unable to determine  Discharge Planning: [place] for rehab with Palliative care service follow-up      Primary Diagnoses: Present on Admission:  FTT (failure to thrive) in adult  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[place])  CVA (cerebral vascular accident) ([place])   I have reviewed the medical record, interviewed the patient and family, and examined the patient. The following aspects are pertinent.  Past Medical History:  Diagnosis Date   Depression    HIV infection ([place])    Social History  Socioeconomic History   Marital status: Single    Spouse name: Not on file   Number of children: Not on file   Years of education: Not on file   Highest education level: Not on file  Occupational History   Not on file  Tobacco Use   Smoking status: Former    Packs/day: 0.50    Years: 25.00    Pack years: 12.50    Types: Cigarettes   Smokeless tobacco: Never   Tobacco comments:    cutting back  Vaping Use   Vaping Use: Never used  Substance and Sexual Activity   Alcohol use: Not Currently    Alcohol/week: 2.0 standard  drinks    Types: 2 Standard drinks or equivalent per week    Comment: 3-4 days a week.Last drink: today   Drug use: Not Currently    Types: Marijuana, Cocaine    Comment: Once a week. Last used yesterday.  Cocaine last used today.    Sexual activity: Not Currently    Partners: Male    Comment: encouraged condom use  Other Topics Concern   Not on file  Social History Narrative   Not on file   Social Determinants of Health   Financial Resource Strain: Not on file  Food Insecurity: Not on file  Transportation Needs: Not on file  Physical Activity: Not on file  Stress: Not on file  Social Connections: Not on file   Family History  Problem Relation Age of Onset   Diabetes Mother    Hypertension Mother    Arthritis Mother    Scheduled Meds:  azithromycin  500 mg Oral QHS   bictegravir-emtricitabine-tenofovir AF  1 tablet Oral Daily   enoxaparin (LOVENOX) injection  40 mg Subcutaneous Q24H   ethambutol  400 mg Oral Daily   fluticasone furoate-vilanterol  1 puff Inhalation Daily   mirtazapine  7.5 mg Oral QHS   QUEtiapine  200 mg Oral QHS   sulfamethoxazole-trimethoprim  1 tablet Oral Daily   Continuous Infusions: PRN Meds:.oxyCODONE Medications Prior to Admission:  Prior to Admission medications   Medication Sig Start Date End Date Taking? Authorizing Provider  aspirin 81 MG chewable tablet Chew 81 mg by mouth daily. 05/22/21  Yes [provider]  azithromycin (ZITHROMAX) 500 MG tablet Take 500 mg by mouth daily. 04/26/21  Yes [provider]  bictegravir-emtricitabine-tenofovir AF (BIKTARVY) 50-200-25 MG TABS tablet Take 1 tablet by mouth daily. 02/24/21  Yes Comer, Okey Regal, MD  ethambutol (MYAMBUTOL) 400 MG tablet Take 400 mg by mouth daily. 05/20/21  Yes [provider]  fluticasone furoate-vilanterol (BREO ELLIPTA) 100-25 MCG/ACT AEPB Inhale 1 puff into the lungs daily. 05/03/21  Yes Olalere, Adewale A, MD  mirtazapine (REMERON) 15 MG tablet  Take 0.5 tablets (7.5 mg total) by mouth at bedtime. 03/25/21  Yes Comer, Okey Regal, MD  Multiple Vitamin Artis Fl[place]) TABS Take 1 tablet by mouth daily. 05/20/21  Yes [provider]  QUEtiapine (SEROQUEL) 200 MG tablet Take 1 tablet (200 mg total) by mouth at bedtime. 03/25/21  Yes Comer, Okey Regal, MD  sulfamethoxazole-trimethoprim (BACTRIM DS) 800-160 MG tablet Take 1 tablet by mouth daily. 05/25/21  Yes Comer, Okey Regal, MD  aspirin EC 81 MG tablet Take 1 tablet (81 mg total) by mouth daily. Swallow whole. Patient not taking: Reported on 07/13/2021 05/25/21   Thayer Headings, MD  atorvastatin (LIPITOR) 40 MG tablet Take 1 tablet (40 mg total) by mouth daily. Patient not taking: Reported on 07/13/2021  05/25/21   Thayer Headings, MD  fluticasone furoate-vilanterol (BREO ELLIPTA) 100-25 MCG/INH AEPB Inhale 1 puff into the lungs daily. Patient not taking: Reported on 04/21/2021 03/25/21   Thayer Headings, MD  megestrol (MEGACE ES) 625 MG/5ML suspension Take 5 mLs (625 mg total) by mouth daily. Patient not taking: Reported on 07/13/2021 05/25/21   Thayer Headings, MD  umeclidinium bromide (INCRUSE ELLIPTA) 62.5 MCG/INH AEPB Inhale 1 puff into the lungs daily. Patient not taking: Reported on 04/21/2021 03/25/21   Thayer Headings, MD   No Known Allergies Review of Systems +weakness  Physical Exam Right-sided weakness Answers all questions appropriately Awake alert oriented Regular work of breathing S1-S2  Vital Signs: BP 102/74    Pulse (!) 105    Temp 99.1 F (37.3 C) (Oral)    Resp 18    SpO2 96%  Pain Scale: 0-10   Pain Score: 2    SpO2: SpO2: 96 % O2 Device:SpO2: 96 % O2 Flow Rate: .   IO: Intake/output summary:  Intake/Output Summary (Last 24 hours) at 07/15/2021 0732 Last data filed at 07/14/2021 2100 Gross per 24 hour  Intake 456 ml  Output 650 ml  Net -194 ml    LBM: Last BM Date: 07/14/21 Baseline Weight:   Most recent weight:       Palliative  Assessment/Data:   PPS 50%  Time In:  1500 Time Out:  1600 Time Total:  60 Greater than 50%  of this time was spent counseling and coordinating care related to the above assessment and plan.  Signed by: Loistine Chance, MD   Please contact Palliative Medicine Team phone at [phone] for questions and concerns.  For individual provider: See Shea Evans

## 2021-07-16 LAB — COMPREHENSIVE METABOLIC PANEL
ALT: 78 U/L — ABNORMAL HIGH (ref 0–44)
AST: 66 U/L — ABNORMAL HIGH (ref 15–41)
Albumin: 3 g/dL — ABNORMAL LOW (ref 3.5–5.0)
Alkaline Phosphatase: 182 U/L — ABNORMAL HIGH (ref 38–126)
Anion gap: 10 (ref 5–15)
BUN: 12 mg/dL (ref 6–20)
CO2: 17 mmol/L — ABNORMAL LOW (ref 22–32)
Calcium: 9.2 mg/dL (ref 8.9–10.3)
Chloride: 102 mmol/L (ref 98–111)
Creatinine, Ser: 0.97 mg/dL (ref 0.61–1.24)
GFR, Estimated: >60 mL/min (ref >60)
Glucose, Bld: 90 mg/dL (ref 70–99)
Potassium: 4.1 mmol/L (ref 3.5–5.1)
Sodium: 129 mmol/L — ABNORMAL LOW (ref 135–145)
Total Bilirubin: 0.2 mg/dL — ABNORMAL LOW (ref 0.3–1.2)
Total Protein: 7.4 g/dL (ref 6.5–8.1)

## 2021-07-16 LAB — HEPATITIS PANEL, ACUTE
HCV Ab: NONREACTIVE
Hep A IgM: NONREACTIVE
Hep B C IgM: NONREACTIVE
Hepatitis B Surface Ag: NONREACTIVE

## 2021-07-16 LAB — MAGNESIUM: Magnesium: 1.9 mg/dL (ref 1.7–2.4)

## 2021-07-16 LAB — CK: Total CK: 79 U/L (ref 49–397)

## 2021-07-16 LAB — CORTISOL: Cortisol, Plasma: 7.8 ug/dL

## 2021-07-16 MED ORDER — SODIUM CHLORIDE 0.9 % IV SOLN: INTRAVENOUS | Status: AC

## 2021-07-16 NOTE — Plan of Care (Signed)
°  Problem: Education: Goal: Knowledge of General Education information will improve Description: Including pain rating scale, medication(s)/side effects and non-pharmacologic comfort measures Outcome: Progressing   Problem: Health Behavior/Discharge Planning: Goal: Ability to manage health-related needs will improve Outcome: Progressing   Problem: Clinical Measurements: Goal: Ability to maintain clinical measurements within normal limits will improve Outcome: Progressing Goal: Diagnostic test results will improve Outcome: Progressing Goal: Cardiovascular complication will be avoided Outcome: Progressing   Problem: Nutrition: Goal: Adequate nutrition will be maintained Outcome: Progressing   Problem: Pain Managment: Goal: General experience of comfort will improve Outcome: Progressing   Problem: Safety: Goal: Ability to remain free from injury will improve Outcome: Progressing   Problem: Skin Integrity: Goal: Risk for impaired skin integrity will decrease Outcome: Progressing   

## 2021-07-16 NOTE — Progress Notes (Signed)
PROGRESS NOTE    Kurt Foley  PJK:[id number] DOB: [birth year] DOA: 07/13/2021 PCP: Patient, No Pcp Per (Inactive)    Chief Complaint  Patient presents with   Leg Pain    Brief Narrative:   Kurt Foley is a 46 y.o. 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, he progressively got worse with increased weakness   Subjective:  Has tachycardia when getting up per RN report Otherwise no acute interval changes Does not want to talk much today Poor oral intake, on hydration  Assessment & Plan:   Principal Problem:   FTT (failure to thrive) in adult Active Problems:   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[place])   AIDS (acquired immune deficiency syndrome) ([place])   History of cardioembolic cerebrovascular accident (CVA)   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[facility])   CVA (cerebral vascular accident) ([place])   Malnutrition of moderate degree   HIV/ AIDS/PML/disseminated MAC -CD4 less than 35 -ID consulted recommend continuing Biktarvy, Bactrim for P JP prophylaxis, continue azithromycin/ethambutol for disseminated MAC -Status post LP in the ED, ID will follow-up on test result -MRI brain findings is consistent with reported biopsy result of PML. No new abnormal signal or enhancement elsewhere in the brain." -Seen by ophthalmology Dr. Eulas Post, patient enema and dilated fundus exam, no acute findings detail please refer to original consult notes by Dr. Eulas Post on 2/1 - low grade intermittent fever, most likely from  AIDS/MAC per phone conversation with ID on 2/3,  f/u on culture result -Management per ID , appreciate ID input  Low back pain/inability to continue to walk -Does have tenderness to low back on exam -MRI lumbar spine with without contrast no acute findings   History of CVA  with right hemiparesis Resume ASA after lumbar spine Hold statin due to elevation of LFT   Hyponatremia Reported poor appetite, appears dehydrated Continue gentle hydration  LFT elevation Unremarkable CK and acute hepatitis panel  abdominal ultrasound with multiple chronic findings no acute findings, detail please refer to original report Hold statin for now Monitor LFT   FTT, poor prognosis Need 2 person assist to get out of bed, agreed to proceed with SNF placement v cir Palliative care consulted   Nutrition Problem: Moderate Malnutrition Etiology: chronic illness (HIV/AIDS) Signs/Symptoms: energy intake < or equal to 75% for > or equal to 1 month, moderate fat depletion, mild muscle depletion Interventions: Ensure Enlive (each supplement provides 350kcal and 20 grams of protein), MVI, Education       Unresulted Labs (From admission, onward)     Start     Ordered   07/17/21 8099  Basic metabolic panel  Daily,   R      07/16/21 1528   07/15/21 0829  Urine Culture  Once,   R       Question:  Indication  Answer:  Altered mental status (if no other cause identified)   07/15/21 0828   07/15/21 0500  Comprehensive metabolic panel  Daily,   R      07/14/21 1812   07/14/21 0500  Histoplasma antigen, urine  Tomorrow morning,   R        07/13/21 1656   07/14/21 0500  Blastomyces Antigen  Tomorrow morning,   R        07/13/21 1656   07/13/21 1550  Miscellaneous [facility] test (send-out)  Once,   R       Question:  Test name / description:  Answer:  csf meningoencephalitis pcr   07/13/21 1549   07/13/21 1546  CMV dna by pcr, qualitative  Once,   R        07/13/21 1546   07/13/21 1546  Acid Fast Smear (AFB)  (AFB smear + Culture w reflexed sensitivities panel)  Once,   R       See Hyperspace for full Linked Orders Report.   07/13/21 1546   07/13/21 1546  Acid Fast Culture with reflexed sensitivities  (AFB smear + Culture w reflexed sensitivities panel)  Once,   R       See  Hyperspace for full Linked Orders Report.   07/13/21 1546   07/13/21 1545  JC virus, PRC CSF  Once,   R        07/13/21 1546   07/13/21 1544  Epstein barr vrs(ebv dna by pcr)  Once,   R        07/13/21 1546              DVT prophylaxis: enoxaparin (LOVENOX) injection 40 mg Start: 07/14/21 1000 SCDs Start: 07/13/21 2038   Code Status: No intubation, partial code Family Communication: Mother over the phone on 2/2 Disposition:   Status is: Inpatient   Dispo: The patient is from: Home              Anticipated d/c is to: SNF versus CIR              Anticipated d/c date is: Monday or Tuesday, pending sodium level and insurance authorization                 Objective: Vitals:   07/16/21 0018 07/16/21 0537 07/16/21 1210 07/16/21 1255  BP: 95/72 98/76 100/74   Pulse: 95 93 95 97  Resp: _0 Temp: 99.8 F (37.7 C) 98.5 F (36.9 C) 99.1 F (37.3 C)   TempSrc: Oral Oral Oral   SpO2: 100% 99% 99%     Intake/Output Summary (Last 24 hours) at 07/16/2021 1531 Last data filed at 07/16/2021 1206 Gross per 24 hour  Intake --  Output 350 ml  Net -350 ml   There were no vitals filed for this visit.  Examination:  General exam: Very frail and weak, chronic ill-appearing , AAOx3  Respiratory system: Clear to auscultation. Respiratory effort normal. Cardiovascular system:  RRR.  Gastrointestinal system: Abdomen is nondistended, soft and nontender.  Normal bowel sounds heard. Central nervous system: Alert and oriented.  Right hemiplegia Extremities:  no edema Skin: No rashes, lesions or ulcers Psychiatry: Calm and cooperative.     Data Reviewed: I have personally reviewed following labs and imaging studies  CBC: Recent Labs  Lab 07/13/21 1208 07/14/21 0530 07/14/21 1937  WBC 4.3 3.9* 4.2  NEUTROABS 3.2  --  2.8  HGB 10.2* 9.0* 9.9*  HCT 31.8* 27.4* 31.1*  MCV 88.3 87.3 88.4  PLT 542* 405* 950    Basic Metabolic Panel: Recent Labs  Lab 07/13/21 1208  07/14/21 0530 07/15/21 0455 07/16/21 0715  NA 130* 129* 127* 129*  K 4.7 4.4 4.2 4.1  CL 102 106 99 102  CO2 19* 16* 18* 17*  GLUCOSE 97 101* 90 90  BUN _1 CREATININE 1.05 0.98 1.04 0.97  CALCIUM 10.1 9.3 9.4 9.2  MG  --   --   --  1.9  GFR: Estimated Creatinine Clearance: 68.8 mL/min (by C-G formula based on SCr of 0.97 mg/dL).  Liver Function Tests: Recent Labs  Lab 07/13/21 1208 07/14/21 0530 07/15/21 0455 07/16/21 0715  AST 80* 75* 76* 66*  ALT 91* 83* 82* 78*  ALKPHOS 244* 193* 196* 182*  BILITOT 0.4 0.2* 0.2* 0.2*  PROT 10.1* 8.1 8.1 7.4  ALBUMIN 4.0 3.2* 3.4* 3.0*    CBG: No results for input(s): GLUCAP in the last 168 hours.   Recent Results (from the past 240 hour(s))  CSF culture     Status: None (Preliminary result)   Collection Time: 07/13/21  4:11 PM   Specimen: CSF; Cerebrospinal Fluid  Result Value Ref Range Status   Specimen Description   Final    CSF Performed at [facility] [address] [place], [place] [address]    Special Requests   Final    NONE Performed at [facility], [place] [address] [place], [place] [address]    Gram Stain   Final    NO WBC SEEN NO ORGANISMS SEEN CYTOSPIN SMEAR Gram Stain Report Called to,Read Back By and Verified With: H.YOUNT, RN AT 1826 ON 02.01.23 BY N.THOMPSON    Culture   Final    NO GROWTH 3 DAYS Performed at [facility], [place] [address] [place], [place] [address]    Report Status PENDING  Incomplete  Culture, fungus without smear     Status: None (Preliminary result)   Collection Time: 07/13/21  4:11 PM   Specimen: CSF; Other  Result Value Ref Range Status   Specimen Description   Final    CSF Performed at [facility] [address], [place], [place] [address]    Special Requests   Final    NONE Performed at [facility], [place] [address], [place], [place] [address]    Culture   Final    NO  FUNGUS ISOLATED AFTER 3 DAYS Performed at [facility], [place] [address], [place],  [address]    Report Status PENDING  Incomplete  Resp Panel by RT-PCR (Flu A&B, Covid) Nasopharyngeal Swab     Status: None   Collection Time: 07/13/21  4:17 PM   Specimen: Nasopharyngeal Swab; Nasopharyngeal(NP) swabs in vial transport medium  Result Value Ref Range Status   SARS Coronavirus 2 by RT PCR NEGATIVE NEGATIVE Final    Comment: (NOTE) SARS-CoV-2 target nucleic acids are NOT DETECTED.  The SARS-CoV-2 RNA is generally detectable in upper respiratory specimens during the acute phase of infection. The lowest concentration of SARS-CoV-2 viral copies this assay can detect is 138 copies/mL. A negative result does not preclude SARS-Cov-2 infection and should not be used as the sole basis for treatment or other patient management decisions. A negative result may occur with  improper specimen collection/handling, submission of specimen other than nasopharyngeal swab, presence of viral mutation(s) within the areas targeted by this assay, and inadequate number of viral copies(<138 copies/mL). A negative result must be combined with clinical observations, patient history, and epidemiological information. The expected result is Negative.  Fact Sheet for Patients:  EntrepreneurPulse.com.au  Fact Sheet for Healthcare Providers:  IncredibleEmployment.be  This test is no t yet approved or cleared by the [place] FDA and  has been authorized for detection and/or diagnosis of SARS-CoV-2 by FDA under an Emergency Use Authorization (EUA). This EUA will remain  in effect (meaning this test can be used) for the duration of  the COVID-19 declaration under Section 564(b)(1) of the Act, 21 U.S.C.section 360bbb-3(b)(1), unless the authorization is terminated  or revoked sooner.       Influenza A by PCR NEGATIVE NEGATIVE Final   Influenza B by PCR NEGATIVE  NEGATIVE Final    Comment: (NOTE) The Xpert Xpress SARS-CoV-2/FLU/RSV plus assay is intended as an aid in the diagnosis of influenza from Nasopharyngeal swab specimens and should not be used as a sole basis for treatment. Nasal washings and aspirates are unacceptable for Xpert Xpress SARS-CoV-2/FLU/RSV testing.  Fact Sheet for Patients: EntrepreneurPulse.com.au  Fact Sheet for Healthcare Providers: IncredibleEmployment.be  This test is not yet approved or cleared by the [place] FDA and has been authorized for detection and/or diagnosis of SARS-CoV-2 by FDA under an Emergency Use Authorization (EUA). This EUA will remain in effect (meaning this test can be used) for the duration of the COVID-19 declaration under Section 564(b)(1) of the Act, 21 U.S.C. section 360bbb-3(b)(1), unless the authorization is terminated or revoked.  Performed at [facility], [place] [address], [place], [place] [address]   Culture, blood (routine x 2)     Status: None (Preliminary result)   Collection Time: 07/14/21  9:01 PM   Specimen: BLOOD RIGHT HAND  Result Value Ref Range Status   Specimen Description   Final    BLOOD RIGHT HAND Performed at [facility] [address] [place], [place] [address]    Special Requests   Final    BOTTLES DRAWN AEROBIC ONLY Blood Culture adequate volume Performed at [facility] [address], [place], [place] [address]    Culture   Final    NO GROWTH 1 DAY Performed at [facility], [place] [address], [place], [place] [address]    Report Status PENDING  Incomplete  Culture, blood (routine x 2)     Status: None (Preliminary result)   Collection Time: 07/14/21  9:01 PM   Specimen: BLOOD RIGHT WRIST  Result Value Ref Range Status   Specimen Description   Final    BLOOD RIGHT WRIST Performed at [facility] [address], [place], [place]  [address]    Special Requests   Final    BOTTLES DRAWN AEROBIC ONLY Blood Culture adequate volume Performed at [facility] [address] [place], [place] [address]    Culture   Final    NO GROWTH 1 DAY Performed at [facility], [place] [address], [place], [place] [address]    Report Status PENDING  Incomplete         Radiology Studies: MR Lumbar Spine W Wo Contrast  Result Date: 07/16/2021 CLINICAL DATA:  Low back pain, cauda equina syndrome suspected EXAM: MRI LUMBAR SPINE WITHOUT AND WITH CONTRAST TECHNIQUE: Multiplanar and multiecho pulse sequences of the lumbar spine were obtained without and with intravenous contrast. CONTRAST:  65m GADAVIST GADOBUTROL 1 MMOL/ML IV SOLN COMPARISON:  No prior MRI, correlation is made with CT chest abdomen pelvis 06/28/2021. FINDINGS: Segmentation: Counting from the first rib-bearing vertebral body on the 06/28/2021 CT chest abdomen pelvis, there are 12 rib-bearing vertebral bodies. Therefore, there is complete sacralization of L5, with a disc space at L5-S1. Alignment:  Mild levocurvature.  No listhesis. Vertebrae: No acute fracture or suspicious osseous lesion. No abnormal enhancement. Conus medullaris and cauda equina: Conus extends to the T12 level. Conus and cauda equina are normal. No abnormal enhancement. Paraspinal and other soft tissues: Negative. Disc  levels: T12-L1: Seen only on the sagittal images. No significant disc bulge, spinal canal stenosis, or neural foraminal narrowing. L1-L2: No significant disc bulge. No spinal canal stenosis or neural foraminal narrowing. L2-L3: No significant disc bulge. No spinal canal stenosis or neural foraminal narrowing. L3-L4: No significant disc bulge. Mild facet arthropathy. No spinal canal stenosis or neural foraminal narrowing. L4-L5: No significant disc bulge. Mild facet arthropathy. No spinal canal stenosis or neural foraminal narrowing. L5-S1: No significant disc bulge. No spinal canal  stenosis or neural foraminal narrowing. IMPRESSION: 1. No spinal canal stenosis or neural foraminal narrowing. No evidence of cauda equina syndrome. 2. No abnormal osseous, conus, or cauda equina enhancement. 3. Transitional anatomy, with sacralization of L5. Please correlate with imaging if any intervention is planned. Electronically Signed   By: Merilyn Baba M.D.   On: 07/16/2021 00:34   US Abdomen Limited  Result Date: 07/15/2021 CLINICAL DATA:  A 46 year old male presents with history of elevated liver function tests. EXAM: ULTRASOUND ABDOMEN LIMITED RIGHT UPPER QUADRANT COMPARISON:  June 29, 2021. FINDINGS: Gallbladder: No gallstones or wall thickening visualized. No sonographic Murphy sign noted by sonographer. Common bile duct: Diameter: 6.6 mm top-normal but not substantially changed compared to prior MR imaging from December of 2020. Liver: Echogenic hepatic lesions compatible with small hemangiomas also seen on prior imaging in the RIGHT hepatic lobe. Portal vein is patent on color Doppler imaging with normal direction of blood flow towards the liver. Other: None. IMPRESSION: No acute findings. Stable top-normal caliber of the common bile duct remains of uncertain significance but is not substantially changed based on comparison with prior MR imaging from 2020. If there is worsening of symptoms or laboratory values repeat MRI assessment could be considered as warranted. Hepatic hemangiomas as before. Electronically Signed   By: Zetta Bills M.D.   On: 07/15/2021 16:41   DG CHEST PORT 1 VIEW  Result Date: 07/15/2021 CLINICAL DATA:  Fever. EXAM: PORTABLE CHEST 1 VIEW COMPARISON:  June 26, 2021. FINDINGS: The heart size and mediastinal contours are within normal limits. Stable right upper lobe opacity is noted most consistent with scarring. Emphysematous disease is noted bilaterally. Minimal left basilar subsegmental atelectasis or scarring is noted. The visualized skeletal structures are  unremarkable. IMPRESSION: Minimal left basilar subsegmental atelectasis or scarring. Stable right upper lobe opacity is noted most consistent with scarring. Emphysema (ICD10-J43.9). Electronically Signed   By: Marijo Conception M.D.   On: 07/15/2021 09:46        Scheduled Meds:  azithromycin  500 mg Oral QHS   bictegravir-emtricitabine-tenofovir AF  1 tablet Oral Daily   enoxaparin (LOVENOX) injection  40 mg Subcutaneous Q24H   ethambutol  400 mg Oral Daily   feeding supplement  237 mL Oral BID BM   fluticasone furoate-vilanterol  1 puff Inhalation Daily   mirtazapine  7.5 mg Oral QHS   multivitamin with minerals  1 tablet Oral Daily   polyethylene glycol  17 g Oral Daily   QUEtiapine  200 mg Oral QHS   senna-docusate  1 tablet Oral BID   sulfamethoxazole-trimethoprim  1 tablet Oral Daily   Continuous Infusions:     LOS: 2 days   Greater than 50% of this time was spent in counseling, explanation of diagnosis, planning of further management, and coordination of care.   Voice Recognition Viviann Spare dictation system was used to create this note, attempts have been made to correct errors. Please contact the author with questions and/or clarifications.   Annamaria Boots  Erlinda Hong, MD PhD FACP [facility]  Available via Epic secure chat 7am-7pm for nonurgent issues Please page for urgent issues To page the attending provider between 7A-7P or the covering provider during after hours 7P-7A, please log into the web site [email or web address] and access using universal [place] password for that web site. If you do not have the password, please call the hospital operator.    07/16/2021, 3:31 PM

## 2021-07-16 NOTE — Plan of Care (Signed)

## 2021-07-16 NOTE — Evaluation (Addendum)
Occupational Therapy Evaluation Patient Details Name: Kurt Foley MRN: [id number] DOB: 1975-06-30 Today's Date: 07/16/2021   History of Present Illness Patient admitted with progressive weakness and failure to thrive. 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 PMH significant for HIV/AIDS, CVA with R sided weakness, disseminated MAC, and depression.   Clinical Impression   Patient is a 46 year old male who was admitted for above. Patient was living at home with aunt who works during the day prior level. Currently, patient is mod A to transfer from edge of bed to recliner in room with air cast on RLE with max A for LB dressing tasks. Patient is motivated to transition home soon. Patient is noted to have decreased activity tolerance, decreased endurance, decreased standing balance, decreased safety awareness, and decreased sequencing impacting participation in ADLs. Patient would continue to benefit from skilled OT services at this time while admitted and after d/c to address noted deficits in order to improve overall safety and independence in ADLs.        Recommendations for follow up therapy are one component of a multi-disciplinary discharge planning process, led by the attending physician.  Recommendations may be updated based on patient status, additional functional criteria and insurance authorization.   Follow Up Recommendations  Acute inpatient rehab (3hours/day)    Assistance Recommended at Discharge Frequent or constant Supervision/Assistance  Patient can return home with the following A lot of help with walking and/or transfers;A lot of help with bathing/dressing/bathroom;Assist for transportation;Direct supervision/assist for financial management;Assistance with cooking/housework;Direct supervision/assist for medications management;Help with stairs or ramp for entrance    Functional Status Assessment   Patient has had a recent decline in their functional status and demonstrates the ability to make significant improvements in function in a reasonable and predictable amount of time.  Equipment Recommendations  Other (comment) (defer to next level)    Recommendations for Other Services       Precautions / Restrictions Precautions Precautions: Fall Precaution Comments: R side hemi Restrictions Weight Bearing Restrictions: No      Mobility Bed Mobility Overal bed mobility: Modified Independent             General bed mobility comments: Pt with use of UEs to complete B LEs to EOB- increased time    Transfers                          Balance Overall balance assessment: Needs assistance Sitting-balance support: Feet supported Sitting balance-Leahy Scale: Good     Standing balance support: During functional activity, Single extremity supported Standing balance-Leahy Scale: Fair Standing balance comment: for static standing with one UE support                           ADL either performed or assessed with clinical judgement   ADL Overall ADL's : Needs assistance/impaired Eating/Feeding: Set up;Sitting   Grooming: Wash/dry face;Wash/dry hands;Sitting;Minimal assistance   Upper Body Bathing: Minimal assistance;Sitting   Lower Body Bathing: Moderate assistance;Sit to/from stand;Sitting/lateral leans   Upper Body Dressing : Minimal assistance;Sitting   Lower Body Dressing: Sit to/from stand;Sitting/lateral leans;Maximal assistance   Toilet Transfer: Moderate assistance Toilet Transfer Details (indicate cue type and reason): with hand held assistance to transfer from edge of bed to recliner in room with increased time and cues for each step. Toileting- Clothing Manipulation and Hygiene: Maximal assistance;Sit to/from stand  Functional mobility during ADLs: Moderate assistance General ADL Comments: with air cast in place. Nurse and NT were  educated on use of air cast, and need to use resting hand splint in PM hours. Patients nurse and NT verbalized understanding.      Vision Patient Visual Report: No change from baseline       Perception     Praxis      Pertinent Vitals/Pain Pain Assessment Pain Assessment: Faces Faces Pain Scale: Hurts a little bit Pain Location: generalized Pain Descriptors / Indicators: Sore Pain Intervention(s): Limited activity within patient's tolerance, Monitored during session     Hand Dominance Left   Extremity/Trunk Assessment Upper Extremity Assessment Upper Extremity Assessment: RUE deficits/detail RUE Deficits / Details: R hemiparesis- history of CVA. Finger flexion contracture. patient has resting hand splint here at hospital. typically wears 4 hours on and 4 hours off at night. nursing to carryover. splint was trialed to fit on this date with splint fitting.   Lower Extremity Assessment Lower Extremity Assessment: Defer to PT evaluation   Cervical / Trunk Assessment Cervical / Trunk Assessment: Normal   Communication Communication Communication: No difficulties   Cognition Arousal/Alertness: Awake/alert Behavior During Therapy: Flat affect Overall Cognitive Status: Difficult to assess                                 General Comments: patients aunt was in the room with patient defering to aunt to answering most questions. aunt was noted to have different answers from patient as far as PLOF and assist needed at home.     General Comments       Exercises     Shoulder Instructions      Home Living Family/patient expects to be discharged to:: Private residence Living Arrangements: Other relatives (aunt) Available Help at Discharge: Family Type of Home: House Home Access: Level entry;Stairs to enter Entrance Stairs-Number of Steps: 3 steps to get inside   Home Layout: One level     Bathroom Shower/Tub: Tub/shower unit         Home Equipment:  Conservation officer, nature (2 wheels);Cane - single point   Additional Comments: Aunt works during the day 4am-5pm. Pt states he has not been very mobile lately, uses RW at baseline. unclear of baseline cognitive status, no family present to determine.      Prior Functioning/Environment                 ADLs Comments: patient and aunt report patient was independent in ADLs at home with patient furniture walking to bathroom at home to bathroom that was across hallway from room.        OT Problem List: Decreased activity tolerance;Impaired balance (sitting and/or standing);Decreased safety awareness;Decreased knowledge of precautions;Decreased knowledge of use of DME or AE      OT Treatment/Interventions: Self-care/ADL training;Therapeutic exercise;Neuromuscular education;Energy conservation;DME and/or AE instruction;Therapeutic activities;Balance training;Patient/family education    OT Goals(Current goals can be found in the care plan section) Acute Rehab OT Goals Patient Stated Goal: to get better OT Goal Formulation: With patient Time For Goal Achievement: 07/30/21 Potential to Achieve Goals: Good  OT Frequency: Min 2X/week    Co-evaluation              AM-PAC OT "6 Clicks" Daily Activity     Outcome Measure Help from another person eating meals?: A Little Help from another person taking care of personal grooming?: A Little Help  from another person toileting, which includes using toliet, bedpan, or urinal?: A Lot Help from another person bathing (including washing, rinsing, drying)?: A Lot Help from another person to put on and taking off regular upper body clothing?: A Lot Help from another person to put on and taking off regular lower body clothing?: A Lot 6 Click Score: 14   End of Session Equipment Utilized During Treatment: Gait belt Nurse Communication: Mobility status  Activity Tolerance: Patient tolerated treatment well Patient left: in chair;with call bell/phone  within reach;with chair alarm set;with family/visitor present  OT Visit Diagnosis: Unsteadiness on feet (R26.81);Hemiplegia and hemiparesis Hemiplegia - Right/Left: Right                Time: 2863-8177 OT Time Calculation (min): 40 min Charges:  OT General Charges $OT Visit: 1 Visit OT Evaluation $OT Eval Moderate Complexity: 1 Mod OT Treatments $Self Care/Home Management : 23-37 mins  Jackelyn Poling OTR/L, MS Acute Rehabilitation Department Office# [phone] Pager# [phone]   Marcellina Millin 07/16/2021, 4:07 PM

## 2021-07-17 LAB — COMPREHENSIVE METABOLIC PANEL
ALT: 67 U/L — ABNORMAL HIGH (ref 0–44)
AST: 59 U/L — ABNORMAL HIGH (ref 15–41)
Albumin: 2.7 g/dL — ABNORMAL LOW (ref 3.5–5.0)
Alkaline Phosphatase: 167 U/L — ABNORMAL HIGH (ref 38–126)
Anion gap: 10 (ref 5–15)
BUN: 9 mg/dL (ref 6–20)
CO2: 18 mmol/L — ABNORMAL LOW (ref 22–32)
Calcium: 8.9 mg/dL (ref 8.9–10.3)
Chloride: 102 mmol/L (ref 98–111)
Creatinine, Ser: 0.9 mg/dL (ref 0.61–1.24)
GFR, Estimated: >60 mL/min (ref >60)
Glucose, Bld: 89 mg/dL (ref 70–99)
Potassium: 3.9 mmol/L (ref 3.5–5.1)
Sodium: 130 mmol/L — ABNORMAL LOW (ref 135–145)
Total Bilirubin: 0.2 mg/dL — ABNORMAL LOW (ref 0.3–1.2)
Total Protein: 7 g/dL (ref 6.5–8.1)

## 2021-07-17 LAB — CSF CULTURE W GRAM STAIN
Culture: NO GROWTH
Gram Stain: NONE SEEN

## 2021-07-17 LAB — URINE CULTURE: Culture: <10000 — AB

## 2021-07-17 LAB — JC VIRUS, PCR CSF: JC Virus PCR, CSF: NEGATIVE

## 2021-07-17 MED ORDER — SODIUM CHLORIDE 0.9 % IV BOLUS
1000.0000 mL | Freq: Once | INTRAVENOUS | Status: AC
  Administered 2021-07-17: 1000 mL via INTRAVENOUS

## 2021-07-17 MED ORDER — SODIUM CHLORIDE 0.9 % IV SOLN: INTRAVENOUS | Status: DC

## 2021-07-17 NOTE — Progress Notes (Signed)
°   07/17/21 1313  Assess: MEWS Score  BP 100/76  Pulse Rate (!) 101  Resp 19  SpO2 98 %  O2 Device Room Air  Assess: MEWS Score  MEWS Temp 0  MEWS Systolic 1  MEWS Pulse 1  MEWS RR 0  MEWS LOC 0  MEWS Score 2  MEWS Score Color Yellow  Assess: if the MEWS score is Yellow or Red  Were vital signs taken at a resting state? Yes  Focused Assessment No change from prior assessment  Does the patient meet 2 or more of the SIRS criteria? No  Provider and Rapid Response Notified? Yes  MEWS guidelines implemented *See Row Information* Yes  Treat  MEWS Interventions Escalated (See documentation below)  Pain Scale 0-10  Pain Score 0  Take Vital Signs  Increase Vital Sign Frequency  Yellow: Q 2hr X 2 then Q 4hr X 2, if remains yellow, continue Q 4hrs  Escalate  MEWS: Escalate Yellow: discuss with charge nurse/RN and consider discussing with provider and RRT  Notify: Charge Nurse/RN  Name of Charge Nurse/RN Notified Wendy, RN  Date Charge Nurse/RN Notified 07/17/21  Time Charge Nurse/RN Notified 1315  Notify: Provider  Provider Name/Title Albertine Grates, MD  Date Provider Notified 07/17/21  Time Provider Notified 1318  Notification Type Page  Notification Reason Change in status  Provider response See new orders  Date of Provider Response 07/17/21  Time of Provider Response 1321  Document  Progress note created (see row info) Yes  Assess: SIRS CRITERIA  SIRS Temperature  0  SIRS Pulse 1  SIRS Respirations  0  SIRS WBC 0  SIRS Score Sum  1

## 2021-07-17 NOTE — Progress Notes (Signed)
Physical Therapy Treatment Patient Details Name: Kurt Foley MRN: [id number] DOB: [birth year] Today's Date: 07/17/2021   History of Present Illness Patient admitted with progressive weakness and failure to thrive. 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 PMH significant for HIV/AIDS, CVA with R sided weakness, disseminated MAC, and depression.    PT Comments    Pt very motivated to work with therapy and wants to do as much as he can tolerated.Pt was very pleasant and thankful for our time with him today. Worked sitting EOB with exercises, donned R resting hand splint with assistance, and R aircast to see if this will help to prevent inversion during swing and stand phase on Right with ambulation. Still ataxic with R LE, and fearful of falling, however made progress with ambulation tolerance today and stability. Will continue to work with pt while on acute.    Recommendations for follow up therapy are one component of a multi-disciplinary discharge planning process, led by the attending physician.  Recommendations may be updated based on patient status, additional functional criteria and insurance authorization.  Follow Up Recommendations  Acute inpatient rehab (3hours/day)     Assistance Recommended at Discharge Frequent or constant Supervision/Assistance  Patient can return home with the following Assistance with cooking/housework;Direct supervision/assist for medications management;Direct supervision/assist for financial management;Assist for transportation;A lot of help with walking and/or transfers;A lot of help with bathing/dressing/bathroom   Equipment Recommendations   (may need a WC for future and bad days, and PF for RW)    Recommendations for Other Services       Precautions / Restrictions Precautions Precautions: Fall Precaution Comments: R side hemi Required Braces or Orthoses:  Splint/Cast Splint/Cast: tried aircast on R ankle only when up walkigng to help support the R ankle from extensor with inversion tone. It did seem to help a lot with preventing the enversion in stance phase     Mobility  Bed Mobility Overal bed mobility: Modified Independent             General bed mobility comments: Pt with use of UEs to complete B LEs to EOB- increased time    Transfers Overall transfer level: Needs assistance Equipment used: Rolling walker (2 wheels), Right platform walker Transfers: Sit to/from Stand Sit to Stand: Min assist           General transfer comment: applied R hand splint and use of R platform on RW today. Pt very nervous about falling and wanted [place] to assure he was going to be okay. Strp on PFRW helped to maintain R arm in platform    Ambulation/Gait Ambulation/Gait assistance: Mod assist, Min assist, +2 safety/equipment Gait Distance (Feet): 50 Feet Assistive device: Rolling walker (2 wheels), Right platform walker Gait Pattern/deviations: Step-to pattern, Ataxic, Decreased stride length, Steppage, Narrow base of support Gait velocity: decr     General Gait Details: Pt had more control with aircasat on R ankle , however still very ataxic on R LE for prgression, performs at step to pattern leading with the right, and slow intntially steps. Had to assist with the [place] because this was new to him and a bit heavy to move and maintain balance today. PT held to patient for stability and tech helped with IV pole and RW.   Stairs             Wheelchair Mobility    Modified Rankin (Stroke Patients Only)       Balance  Overall balance assessment: Needs assistance Sitting-balance support: Feet supported Sitting balance-Leahy Scale: Good Sitting balance - Comments: tried to don shoe himself but was a little challenging due to the nature of the shoe and his sitting strength. The R one was very difficult and we brought a shoe horn to assist  next time.     Standing balance-Leahy Scale: Fair                              Cognition Arousal/Alertness: Awake/alert Behavior During Therapy: WFL for tasks assessed/performed Overall Cognitive Status: Difficult to assess                                 General Comments: pt seemed to understand all that was going oon, appropriate conversation, just slow with words and response.        Exercises Other Exercises Other Exercises: sittign edge of bed with with B LEs for exercises and coordiantion exercises for knee extesnion, flexion, andkle DF / PF ( assiisted on R side for all) and rolling foot over a ball for coordination and strength.    General Comments        Pertinent Vitals/Pain Pain Assessment Pain Assessment: No/denies pain    Home Living                          Prior Function            PT Goals (current goals can now be found in the care plan section) Acute Rehab PT Goals Patient Stated Goal: Want to do more therapy so I can walk better, more normal PT Goal Formulation: With patient Time For Goal Achievement: 07/29/21 Potential to Achieve Goals: Fair Progress towards PT goals: Progressing toward goals    Frequency    Min 3X/week      PT Plan Current plan remains appropriate    Co-evaluation              AM-PAC PT "6 Clicks" Mobility   Outcome Measure  Help needed turning from your back to your side while in a flat bed without using bedrails?: None Help needed moving from lying on your back to sitting on the side of a flat bed without using bedrails?: None Help needed moving to and from a bed to a chair (including a wheelchair)?: A Little Help needed standing up from a chair using your arms (e.g., wheelchair or bedside chair)?: A Little Help needed to walk in hospital room?: A Lot Help needed climbing 3-5 steps with a railing? : A Lot 6 Click Score: 18    End of Session Equipment Utilized During  Treatment: Gait belt Activity Tolerance: Patient tolerated treatment well Patient left: in chair;with call bell/phone within reach;with chair alarm set Nurse Communication: Mobility status PT Visit Diagnosis: Unsteadiness on feet (R26.81);Other abnormalities of gait and mobility (R26.89);History of falling (Z91.81);Muscle weakness (generalized) (M62.81);Ataxic gait (R26.0);Other symptoms and signs involving the nervous system (R29.898)     Time: 1145-1230 PT Time Calculation (min) (ACUTE ONLY): 45 min  Charges:  $Gait Training: 8-22 mins $Neuromuscular Re-education: 23-37 mins                     Rendy Lazard, PT, MPT Acute Rehabilitation Services Office: [phone] Pager: [phone] 07/17/2021    Clide Dales 07/17/2021, 2:06 PM

## 2021-07-17 NOTE — Progress Notes (Signed)
PROGRESS NOTE    Kurt Foley  IBB:[id number] DOB: 1975-09-10 DOA: 07/13/2021 PCP: Patient, No Pcp Per (Inactive)    Chief Complaint  Patient presents with   Leg Pain    Brief Narrative:   Kurt Foley is a 46 y.o. male with medical history significant of CVA with right hemiparesis , h/o HIV/AIDS, last CD4 less than 9, disseminated MAC ,was recently discharged from [facility] after biopsy of the brain showed JC virus /PML, he progressively got worse with increased weakness   Subjective:  Very weak, with poor oral intake, on hydration Otherwise no acute interval changes Does not want to talk much today   Assessment & Plan:   Principal Problem:   FTT (failure to thrive) in adult Active Problems:   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[place])   AIDS (acquired immune deficiency syndrome) ([place])   History of cardioembolic cerebrovascular accident (CVA)   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HCC)   CVA (cerebral vascular accident) ([place])   Malnutrition of moderate degree   HIV/ AIDS/PML/disseminated MAC -CD4 less than 35 -ID consulted recommend continuing Biktarvy, Bactrim for P JP prophylaxis, continue azithromycin/ethambutol for disseminated MAC -Status post LP in the ED, ID will follow-up on test result -MRI brain findings is consistent with reported biopsy result of PML. No new abnormal signal or enhancement elsewhere in the brain." -Seen by ophthalmology Dr. Eulas Post, patient enema and dilated fundus exam, no acute findings detail please refer to original consult notes by Dr. Eulas Post on 2/1 - low grade intermittent fever, most likely from  AIDS/MAC per phone conversation with ID on 2/3,  f/u on culture result -Management per ID , appreciate ID input  Low back pain/inability to continue to walk -Does have tenderness to low back on exam -MRI lumbar spine with without contrast no acute findings -Continue PT   History of CVA with right  hemiparesis Resume ASA after lumbar spine Hold statin due to elevation of LFT   Hyponatremia Reported poor appetite, appears dehydrated Continue gentle hydration  LFT elevation Unremarkable CK and acute hepatitis panel  abdominal ultrasound with multiple chronic findings no acute findings, detail please refer to original report Hold statin for now Monitor LFT   FTT, poor prognosis Need 2 person assist to get out of bed, agreed to proceed with SNF placement v cir Palliative care consulted   Nutrition Problem: Moderate Malnutrition Etiology: chronic illness (HIV/AIDS) Signs/Symptoms: energy intake < or equal to 75% for > or equal to 1 month, moderate fat depletion, mild muscle depletion Interventions: Ensure Enlive (each supplement provides 350kcal and 20 grams of protein), MVI, Education       Unresulted Labs (From admission, onward)     Start     Ordered   07/17/21 4503  Basic metabolic panel  Daily,   R      07/16/21 1528   07/14/21 0500  Histoplasma antigen, urine  Tomorrow morning,   R        07/13/21 1656   07/14/21 0500  Blastomyces Antigen  Tomorrow morning,   R        07/13/21 1656   07/13/21 1550  Miscellaneous [facility] test (send-out)  Once,   R       Question:  Test name / description:  Answer:  csf meningoencephalitis pcr   07/13/21 1549   07/13/21 1546  CMV dna by pcr, qualitative  Once,   R        07/13/21 1546   07/13/21 1546  Acid Fast  Smear (AFB)  (AFB smear + Culture w reflexed sensitivities panel)  Once,   R       See Hyperspace for full Linked Orders Report.   07/13/21 1546   07/13/21 1546  Acid Fast Culture with reflexed sensitivities  (AFB smear + Culture w reflexed sensitivities panel)  Once,   R       See Hyperspace for full Linked Orders Report.   07/13/21 1546   07/13/21 1545  JC virus, PRC CSF  Once,   R        07/13/21 1546   07/13/21 1544  Epstein barr vrs(ebv dna by pcr)  Once,   R        07/13/21 1546              DVT  prophylaxis: enoxaparin (LOVENOX) injection 40 mg Start: 07/14/21 1000 SCDs Start: 07/13/21 2038   Code Status: No intubation, partial code Family Communication: Mother over the phone on 2/2 Disposition:   Status is: Inpatient   Dispo: The patient is from: Home              Anticipated d/c is to: SNF versus CIR              Anticipated d/c date is: Monday or Tuesday                 Objective: Vitals:   07/17/21 0338 07/17/21 1305 07/17/21 1313 07/17/21 1516  BP: 100/75 95/76 100/76 101/77  Pulse: 98 (!) 104 (!) 101 (!) 110  Resp: _0 Temp: 98.4 F (36.9 C) 97.6 F (36.4 C)  99.4 F (37.4 C)  TempSrc: Oral   Oral  SpO2: 99% 99% 98% 97%    Intake/Output Summary (Last 24 hours) at 07/17/2021 1704 Last data filed at 07/17/2021 0900 Gross per 24 hour  Intake 340 ml  Output 700 ml  Net -360 ml   There were no vitals filed for this visit.  Examination:  General exam: Very frail and weak, chronic ill-appearing , AAOx3  Respiratory system: Clear to auscultation. Respiratory effort normal. Cardiovascular system:  RRR.  Gastrointestinal system: Abdomen is nondistended, soft and nontender.  Normal bowel sounds heard. Central nervous system: Alert and oriented.  Right hemiplegia Extremities:  no edema Skin: No rashes, lesions or ulcers Psychiatry: Calm and cooperative.     Data Reviewed: I have personally reviewed following labs and imaging studies  CBC: Recent Labs  Lab 07/13/21 1208 07/14/21 0530 07/14/21 1937  WBC 4.3 3.9* 4.2  NEUTROABS 3.2  --  2.8  HGB 10.2* 9.0* 9.9*  HCT 31.8* 27.4* 31.1*  MCV 88.3 87.3 88.4  PLT 542* 405* 161    Basic Metabolic Panel: Recent Labs  Lab 07/13/21 1208 07/14/21 0530 07/15/21 0455 07/16/21 0715 07/17/21 0550  NA 130* 129* 127* 129* 130*  K 4.7 4.4 4.2 4.1 3.9  CL 102 106 99 102 102  CO2 19* 16* 18* 17* 18*  GLUCOSE 97 101* 90 90 89  BUN _1 CREATININE 1.05 0.98 1.04 0.97 0.90  CALCIUM 10.1 9.3  9.4 9.2 8.9  MG  --   --   --  1.9  --     GFR: Estimated Creatinine Clearance: 74.2 mL/min (by C-G formula based on SCr of 0.9 mg/dL).  Liver Function Tests: Recent Labs  Lab 07/13/21 1208 07/14/21 0530 07/15/21 0455 07/16/21 0715 07/17/21 0550  AST 80* 75* 76* 66* 59*  ALT 91*  83* 82* 78* 67*  ALKPHOS 244* 193* 196* 182* 167*  BILITOT 0.4 0.2* 0.2* 0.2* 0.2*  PROT 10.1* 8.1 8.1 7.4 7.0  ALBUMIN 4.0 3.2* 3.4* 3.0* 2.7*    CBG: No results for input(s): GLUCAP in the last 168 hours.   Recent Results (from the past 240 hour(s))  CSF culture     Status: None   Collection Time: 07/13/21  4:11 PM   Specimen: CSF; Cerebrospinal Fluid  Result Value Ref Range Status   Specimen Description   Final    CSF Performed at [facility] [address], [place], [place] [address]    Special Requests   Final    NONE Performed at [facility], [place] [address], [place], [place] [address]    Gram Stain   Final    NO WBC SEEN NO ORGANISMS SEEN CYTOSPIN SMEAR Gram Stain Report Called to,Read Back By and Verified With: H.YOUNT, RN AT 1826 ON 02.01.23 BY N.THOMPSON    Culture   Final    NO GROWTH 3 DAYS Performed at [facility], [place] [address], [place], [place] [address]    Report Status 07/17/2021 FINAL  Final  Culture, fungus without smear     Status: None (Preliminary result)   Collection Time: 07/13/21  4:11 PM   Specimen: CSF; Other  Result Value Ref Range Status   Specimen Description   Final    CSF Performed at [facility] [address], [place], [place] [address]    Special Requests   Final    NONE Performed at [facility], [place] [address] [place], [place] [address]    Culture   Final    NO FUNGUS ISOLATED AFTER 3 DAYS Performed at [facility], [place] [address] [place],  [address]    Report Status PENDING  Incomplete  Resp Panel by RT-PCR (Flu A&B, Covid)  Nasopharyngeal Swab     Status: None   Collection Time: 07/13/21  4:17 PM   Specimen: Nasopharyngeal Swab; Nasopharyngeal(NP) swabs in vial transport medium  Result Value Ref Range Status   SARS Coronavirus 2 by RT PCR NEGATIVE NEGATIVE Final    Comment: (NOTE) SARS-CoV-2 target nucleic acids are NOT DETECTED.  The SARS-CoV-2 RNA is generally detectable in upper respiratory specimens during the acute phase of infection. The lowest concentration of SARS-CoV-2 viral copies this assay can detect is 138 copies/mL. A negative result does not preclude SARS-Cov-2 infection and should not be used as the sole basis for treatment or other patient management decisions. A negative result may occur with  improper specimen collection/handling, submission of specimen other than nasopharyngeal swab, presence of viral mutation(s) within the areas targeted by this assay, and inadequate number of viral copies(<138 copies/mL). A negative result must be combined with clinical observations, patient history, and epidemiological information. The expected result is Negative.  Fact Sheet for Patients:  EntrepreneurPulse.com.au  Fact Sheet for Healthcare Providers:  IncredibleEmployment.be  This test is no t yet approved or cleared by the [place] FDA and  has been authorized for detection and/or diagnosis of SARS-CoV-2 by FDA under an Emergency Use Authorization (EUA). This EUA will remain  in effect (meaning this test can be used) for the duration of the COVID-19 declaration under Section 564(b)(1) of the Act, 21 U.S.C.section 360bbb-3(b)(1), unless the authorization is terminated  or revoked sooner.       Influenza A by PCR NEGATIVE NEGATIVE Final  Influenza B by PCR NEGATIVE NEGATIVE Final    Comment: (NOTE) The Xpert Xpress SARS-CoV-2/FLU/RSV plus assay is intended as an aid in the diagnosis of influenza from Nasopharyngeal swab specimens and should not be  used as a sole basis for treatment. Nasal washings and aspirates are unacceptable for Xpert Xpress SARS-CoV-2/FLU/RSV testing.  Fact Sheet for Patients: EntrepreneurPulse.com.au  Fact Sheet for Healthcare Providers: IncredibleEmployment.be  This test is not yet approved or cleared by the [place] FDA and has been authorized for detection and/or diagnosis of SARS-CoV-2 by FDA under an Emergency Use Authorization (EUA). This EUA will remain in effect (meaning this test can be used) for the duration of the COVID-19 declaration under Section 564(b)(1) of the Act, 21 U.S.C. section 360bbb-3(b)(1), unless the authorization is terminated or revoked.  Performed at [facility], [place] [address] [place], [place] [address]   Culture, blood (routine x 2)     Status: None (Preliminary result)   Collection Time: 07/14/21  9:01 PM   Specimen: BLOOD RIGHT HAND  Result Value Ref Range Status   Specimen Description   Final    BLOOD RIGHT HAND Performed at [facility] [address], [place], [place] [address]    Special Requests   Final    BOTTLES DRAWN AEROBIC ONLY Blood Culture adequate volume Performed at [facility] [address] [place], [place] [address]    Culture   Final    NO GROWTH 2 DAYS Performed at [facility] [address], [place], [place] [address]    Report Status PENDING  Incomplete  Culture, blood (routine x 2)     Status: None (Preliminary result)   Collection Time: 07/14/21  9:01 PM   Specimen: BLOOD RIGHT WRIST  Result Value Ref Range Status   Specimen Description   Final    BLOOD RIGHT WRIST Performed at [facility] [address], [place], [place] [address]    Special Requests   Final    BOTTLES DRAWN AEROBIC ONLY Blood Culture adequate volume Performed at [facility] [address], [place], [place] [address]     Culture   Final    NO GROWTH 2 DAYS Performed at [facility] [address] [place], [place] [address]    Report Status PENDING  Incomplete  Urine Culture     Status: Abnormal   Collection Time: 07/15/21  8:09 PM   Specimen: Urine, Clean Catch  Result Value Ref Range Status   Specimen Description   Final    URINE, CLEAN CATCH Performed at [facility], [place] [address] [place], [place] [address]    Special Requests   Final    NONE Performed at [facility], [place] [address] [place], [place] [address]    Culture (A)  Final    <10,000 COLONIES/mL INSIGNIFICANT GROWTH Performed at [facility] [address] [place],  [address]    Report Status 07/17/2021 FINAL  Final         Radiology Studies: MR Lumbar Spine W Wo Contrast  Result Date: 07/16/2021 CLINICAL DATA:  Low back pain, cauda equina syndrome suspected EXAM: MRI LUMBAR SPINE WITHOUT AND WITH CONTRAST TECHNIQUE: Multiplanar and multiecho pulse sequences of the lumbar spine were obtained without and with intravenous contrast. CONTRAST:  25m GADAVIST GADOBUTROL 1 MMOL/ML IV SOLN COMPARISON:  No prior MRI, correlation is made with CT chest abdomen pelvis 06/28/2021.  FINDINGS: Segmentation: Counting from the first rib-bearing vertebral body on the 06/28/2021 CT chest abdomen pelvis, there are 12 rib-bearing vertebral bodies. Therefore, there is complete sacralization of L5, with a disc space at L5-S1. Alignment:  Mild levocurvature.  No listhesis. Vertebrae: No acute fracture or suspicious osseous lesion. No abnormal enhancement. Conus medullaris and cauda equina: Conus extends to the T12 level. Conus and cauda equina are normal. No abnormal enhancement. Paraspinal and other soft tissues: Negative. Disc levels: T12-L1: Seen only on the sagittal images. No significant disc bulge, spinal canal stenosis, or neural foraminal narrowing. L1-L2: No significant disc bulge. No spinal  canal stenosis or neural foraminal narrowing. L2-L3: No significant disc bulge. No spinal canal stenosis or neural foraminal narrowing. L3-L4: No significant disc bulge. Mild facet arthropathy. No spinal canal stenosis or neural foraminal narrowing. L4-L5: No significant disc bulge. Mild facet arthropathy. No spinal canal stenosis or neural foraminal narrowing. L5-S1: No significant disc bulge. No spinal canal stenosis or neural foraminal narrowing. IMPRESSION: 1. No spinal canal stenosis or neural foraminal narrowing. No evidence of cauda equina syndrome. 2. No abnormal osseous, conus, or cauda equina enhancement. 3. Transitional anatomy, with sacralization of L5. Please correlate with imaging if any intervention is planned. Electronically Signed   By: Merilyn Baba M.D.   On: 07/16/2021 00:34        Scheduled Meds:  azithromycin  500 mg Oral QHS   bictegravir-emtricitabine-tenofovir AF  1 tablet Oral Daily   enoxaparin (LOVENOX) injection  40 mg Subcutaneous Q24H   ethambutol  400 mg Oral Daily   feeding supplement  237 mL Oral BID BM   fluticasone furoate-vilanterol  1 puff Inhalation Daily   mirtazapine  7.5 mg Oral QHS   multivitamin with minerals  1 tablet Oral Daily   polyethylene glycol  17 g Oral Daily   QUEtiapine  200 mg Oral QHS   senna-docusate  1 tablet Oral BID   sulfamethoxazole-trimethoprim  1 tablet Oral Daily   Continuous Infusions:  sodium chloride 75 mL/hr at 07/17/21 1339      LOS: 3 days   Greater than 50% of this time was spent in counseling, explanation of diagnosis, planning of further management, and coordination of care.   Voice Recognition Viviann Spare dictation system was used to create this note, attempts have been made to correct errors. Please contact the author with questions and/or clarifications.   Florencia Reasons, MD PhD FACP [facility]  Available via Epic secure chat 7am-7pm for nonurgent issues Please page for urgent issues To page the attending  provider between 7A-7P or the covering provider during after hours 7P-7A, please log into the web site [email or web address] and access using universal [place] password for that web site. If you do not have the password, please call the hospital operator.    07/17/2021, 5:04 PM

## 2021-07-17 NOTE — Plan of Care (Signed)
  Problem: Education: Goal: Knowledge of General Education information will improve Description: Including pain rating scale, medication(s)/side effects and non-pharmacologic comfort measures Outcome: Progressing   Problem: Health Behavior/Discharge Planning: Goal: Ability to manage health-related needs will improve Outcome: Progressing   Problem: Clinical Measurements: Goal: Ability to maintain clinical measurements within normal limits will improve Outcome: Progressing   Problem: Activity: Goal: Risk for activity intolerance will decrease Outcome: Progressing   Problem: Nutrition: Goal: Adequate nutrition will be maintained Outcome: Progressing   Problem: Elimination: Goal: Will not experience complications related to urinary retention Outcome: Progressing   Problem: Pain Managment: Goal: General experience of comfort will improve Outcome: Progressing   Problem: Safety: Goal: Ability to remain free from injury will improve Outcome: Progressing   Problem: Skin Integrity: Goal: Risk for impaired skin integrity will decrease Outcome: Progressing   

## 2021-07-17 NOTE — Progress Notes (Addendum)
Inpatient Rehab Admissions Coordinator:   I spoke with Pt. And he confirmed interest in CIR admission but is not sure he will have 24/7 support at d/c. He gave me permission to call family to discuss support and I have placed calls/left messages and await callbacks. I will follow for potential admission pending confirmation of dispo.  Clemens Catholic, [place], [place] Admissions Coordinator  [phone] ([place]) [phone] (office)

## 2021-07-18 DIAGNOSIS — E440 Moderate protein-calorie malnutrition: Secondary | ICD-10-CM

## 2021-07-18 DIAGNOSIS — E871 Hypo-osmolality and hyponatremia: Secondary | ICD-10-CM

## 2021-07-18 DIAGNOSIS — R509 Fever, unspecified: Secondary | ICD-10-CM

## 2021-07-18 LAB — BASIC METABOLIC PANEL
Anion gap: 7 (ref 5–15)
BUN: 6 mg/dL (ref 6–20)
CO2: 18 mmol/L — ABNORMAL LOW (ref 22–32)
Calcium: 8.5 mg/dL — ABNORMAL LOW (ref 8.9–10.3)
Chloride: 105 mmol/L (ref 98–111)
Creatinine, Ser: 0.79 mg/dL (ref 0.61–1.24)
GFR, Estimated: >60 mL/min (ref >60)
Glucose, Bld: 91 mg/dL (ref 70–99)
Potassium: 3.8 mmol/L (ref 3.5–5.1)
Sodium: 130 mmol/L — ABNORMAL LOW (ref 135–145)

## 2021-07-18 MED ORDER — IBUPROFEN 200 MG PO TABS
400.0000 mg | ORAL_TABLET | Freq: Three times a day (TID) | ORAL | Status: DC | PRN
  Administered 2021-07-18 – 2021-07-19 (×3): 400 mg via ORAL
  Filled 2021-07-18 (×3): qty 2

## 2021-07-18 MED ORDER — PANTOPRAZOLE SODIUM 40 MG PO TBEC
40.0000 mg | DELAYED_RELEASE_TABLET | Freq: Every day | ORAL | Status: DC
  Administered 2021-07-18 – 2021-07-20 (×3): 40 mg via ORAL
  Filled 2021-07-18 (×3): qty 1

## 2021-07-18 MED ORDER — UMECLIDINIUM BROMIDE 62.5 MCG/ACT IN AEPB
1.0000 | INHALATION_SPRAY | Freq: Every day | RESPIRATORY_TRACT | Status: DC
  Administered 2021-07-19: 1 via RESPIRATORY_TRACT
  Filled 2021-07-18: qty 7

## 2021-07-18 MED ORDER — MEGESTROL ACETATE 400 MG/10ML PO SUSP
400.0000 mg | Freq: Every day | ORAL | Status: DC
  Administered 2021-07-19 – 2021-07-20 (×2): 400 mg via ORAL
  Filled 2021-07-18 (×2): qty 10

## 2021-07-18 MED ORDER — SODIUM CHLORIDE 0.9 % IV BOLUS
500.0000 mL | Freq: Once | INTRAVENOUS | Status: AC
  Administered 2021-07-18: 500 mL via INTRAVENOUS

## 2021-07-18 MED ORDER — MEGESTROL ACETATE 625 MG/5ML PO SUSP: 625.0000 mg | Freq: Every day | ORAL | Status: DC

## 2021-07-18 MED ORDER — ASPIRIN 81 MG PO CHEW
81.0000 mg | CHEWABLE_TABLET | Freq: Every day | ORAL | Status: DC
  Administered 2021-07-19 – 2021-07-20 (×2): 81 mg via ORAL
  Filled 2021-07-18 (×2): qty 1

## 2021-07-18 NOTE — TOC Initial Note (Signed)
Transition of Care [facility]) - Initial/Assessment Note    Patient Details  Name: Kurt Foley MRN: [id number] Date of Birth: [birth year]  Transition of Care [facility]) CM/SW Contact:    Trish Mage, LCSW Phone Number: 07/18/2021, 11:09 AM  Clinical Narrative:   Patient seen as CIR as been unable to reach family as part of evaluation. Mr Maish gave me number of [phone] as mother's number.  Left v/m message for her to call me back. TOC will continue to follow during the course of hospitalization.                Expected Discharge Plan:  (CIR recommended) Barriers to Discharge: Continued Medical Work up   Patient Goals and CMS Choice        Expected Discharge Plan and Services Expected Discharge Plan:  (CIR recommended)                                              Prior Living Arrangements/Services                       Activities of Daily Living Home Assistive Devices/Equipment: Environmental consultant (specify type), Cane (specify quad or straight) ADL Screening (condition at time of admission) Patient's cognitive ability adequate to safely complete daily activities?: Yes Is the patient deaf or have difficulty hearing?: No Does the patient have difficulty seeing, even when wearing glasses/contacts?: No Does the patient have difficulty concentrating, remembering, or making decisions?: No Patient able to express need for assistance with ADLs?: No Does the patient have difficulty dressing or bathing?: Yes Independently performs ADLs?: No Communication: Independent Dressing (OT): Dependent Grooming: Dependent Feeding: Independent Bathing: Dependent Toileting: Dependent In/Out Bed: Dependent Walks in Home: Dependent Does the patient have difficulty walking or climbing stairs?: Yes Weakness of Legs: Right Weakness of Arms/Hands: Right  Permission Sought/Granted                  Emotional Assessment              Admission diagnosis:  PML  (progressive multifocal leukoencephalopathy) ([place]) [A81.2] FTT (failure to thrive) in adult [R62.7] Patient Active Problem List   Diagnosis Date Noted   Hyponatremia 07/18/2021   Malnutrition of moderate degree 07/15/2021   FTT (failure to thrive) in adult 07/13/2021   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[place]) 07/08/2021   CVA (cerebral vascular accident) ([place]) 07/08/2021   History of cardioembolic cerebrovascular accident (CVA) 05/25/2021   Fever 03/25/2021   Rash of mouth present on examination 03/25/2021   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[place]) 03/25/2021   Need for prophylactic vaccination and inoculation against smallpox 03/25/2021   Numbness and tingling of right arm 03/24/2021   AIDS (acquired immune deficiency syndrome) ([place]) 03/05/2021   Emphysema lung ([place]) 03/05/2021   Cocaine-induced mood disorder ([place])    Tinea pedis 08/06/2019   At risk for opportunistic infections 07/14/2019   Dehydration 06/02/2019   Generalized weakness 06/02/2019   Cocaine abuse ([place]) 11/25/2018   Major depressive disorder, recurrent severe without psychotic features ([place]) 11/25/2018   Substance induced mood disorder ([place]) 09/16/2015   Cocaine use disorder, severe, dependence ([place])    Cannabis use disorder, severe, dependence ([place]) 04/09/2015   Severe recurrent major depression without psychotic features ([place]) 04/08/2015   Alcohol abuse 04/08/2015   Screening examination for venereal disease 03/25/2015  Encounter for long-term (current) use of medications 03/25/2015   Tobacco abuse    Visual disturbance 11/18/2013   Human immunodeficiency virus (HIV) disease ( [place]) 10/30/2013   Atopic dermatitis 10/30/2013   PCP:  Patient, No Pcp Per (Inactive) Pharmacy:   [facility] [place], [place] [address] Phone: [phone] Fax: [phone]  [facility] [phone] - [place], [place] [address] [place] [address] Phone: [phone] Fax: [phone]     Social Determinants of Health (SDOH) Interventions    Readmission Risk Interventions No flowsheet data found.

## 2021-07-18 NOTE — Progress Notes (Signed)
PROGRESS NOTE    Kurt Foley  [name]:[id number] DOB: Oct 11, 1975 DOA: 07/13/2021 PCP: Patient, No Pcp Per (Inactive)    Brief Narrative:  Kurt Foley is a 46 y.o. 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  Patient had been living with his aunt who was having difficulty taking care of him.  In the ED patient was mildly tachycardic.  Patient did have lymphopenia, chronic hyponatremia and mildly elevated LFTs.  ED provider had spoken with ID who recommended ophthalmology and palliative care consultation.  He was then admitted hospital for further evaluation and treatment.       Assessment and Plan: * FTT (failure to thrive) in adult- (present on admission)  poor prognosis.  Palliative care has been consulted, encourage oral nutrition.  Hyponatremia We will continue to monitor.  Plasma cortisol was 7.8.  Check BMP in AM.  Malnutrition of moderate degree Continue nutritional support.    CVA (cerebral vascular accident) [facility])- (present on admission) With residual right-sided weakness.  Continue supportive care.  Aspirin on hold due to lumbar puncture.  We will restart aspirin.  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[place])- (present on admission) ID on board.  Continue antiretroviral treatment.  AIDS (acquired immune deficiency syndrome) ([place]) Patient with history of HIV/PML, disseminated MAC.  CD4 count less than 35.  ID on board and recommend continuation of Biktarvy Bactrim for PJP prophylaxis and azithromycin ethambutol for disseminated MAC.  Status post LP done in the ED. JC virus PCR negative.  CSF culture negative so far.  Follow ID recommendations.  Blood cultures negative in 3 days.  Hepatitis panel negative.  Fungal  culture and CSF negative so far.  CSF VDRL was nonreactive.  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HCC) Continue azithromycin ethambutol.  ID on board.  Continues to have episodes of fever.  We will add Motrin today.   Fever. Urine culture with less than 10,000 colonies insignificant growth.  Blood cultures on 07/14/2021 with no growth in 3 days.  We will continue to monitor closely.  Debility, deconditioning.  Patient has been seen by physical therapy who recommended CIR at this time  Patient was seen by ophthalmology Dr. Eulas Post who did dilated fundus exam without acute findings.    DVT prophylaxis: enoxaparin (LOVENOX) injection 40 mg Start: 07/14/21 1000 SCDs Start: 07/13/21 2038    Code Status:     Code Status: Partial Code  Disposition:  Status is: Inpatient Remains inpatient appropriate because: Need for rehabilitation, ongoing treatment   Family Communication:  Spoke with the patient at bedside  Consultants:  Infectious disease Ophthalmology  Procedures:  Lumbar puncture  Antimicrobials:   Anti-infectives (From admission, onward)    Start     Dose/Rate Route Frequency Ordered Stop   07/13/21 2200  azithromycin (ZITHROMAX) tablet 500 mg        500 mg Oral Daily at bedtime 07/13/21 2018     07/13/21 2115  bictegravir-emtricitabine-tenofovir AF (BIKTARVY) 50-200-25 MG per tablet 1 tablet        1 tablet Oral Daily 07/13/21 2018     07/13/21 2115  ethambutol (MYAMBUTOL) tablet 400 mg        400 mg Oral Daily 07/13/21 2018     07/13/21 2115  sulfamethoxazole-trimethoprim (BACTRIM DS) 800-160 MG per tablet 1 tablet  1 tablet Oral Daily 07/13/21 2018         Subjective: Today, patient was seen and examined at bedside.  Patient states that he does not have much appetite.  Complains of mild back pain.  Nursing staff reported low-grade fever.  Objective: Vitals:   07/18/21 0305 07/18/21 0548 07/18/21 0848 07/18/21 1012  BP: 110/80 96/76  90/76   Pulse: (!) 101 97  (!) 108  Resp: _0 Temp:  100 F (37.8 C)  98.9 F (37.2 C)  TempSrc:  Oral  Oral  SpO2: 100% 97% 97% 99%    Intake/Output Summary (Last 24 hours) at 07/18/2021 1500 Last data filed at 07/17/2021 2133 Gross per 24 hour  Intake 100 ml  Output --  Net 100 ml   There were no vitals filed for this visit.  Physical Examination: General: Patient is alert awake and communicative.  Slow to speech.  Slurred speech. HENT:   No scleral pallor or icterus noted. Oral mucosa is moist.  Chest:  Clear breath sounds.  Diminished breath sounds bilaterally. No crackles or wheezes.  CVS: S1 &S2 heard. No murmur.  Regular rate and rhythm. Abdomen: Soft, nontender, nondistended.  Bowel sounds are heard.   Extremities: No cyanosis, clubbing or edema.  Peripheral pulses are palpable. Psych: Alert, awake and communicative, CNS: Slurred speech, right facial palsy.  Right upper extremity weakness compared to right lower extremity. Skin: Warm and dry.  No rashes noted.  Data Reviewed:   CBC: Recent Labs  Lab 07/13/21 1208 07/14/21 0530 07/14/21 1937  WBC 4.3 3.9* 4.2  NEUTROABS 3.2  --  2.8  HGB 10.2* 9.0* 9.9*  HCT 31.8* 27.4* 31.1*  MCV 88.3 87.3 88.4  PLT 542* 405* 626    Basic Metabolic Panel: Recent Labs  Lab 07/14/21 0530 07/15/21 0455 07/16/21 0715 07/17/21 0550 07/18/21 0516  NA 129* 127* 129* 130* 130*  K 4.4 4.2 4.1 3.9 3.8  CL 106 99 102 102 105  CO2 16* 18* 17* 18* 18*  GLUCOSE 101* 90 90 89 91  BUN _1 CREATININE 0.98 1.04 0.97 0.90 0.79  CALCIUM 9.3 9.4 9.2 8.9 8.5*  MG  --   --  1.9  --   --     GFR: Estimated Creatinine Clearance: 83.5 mL/min (by C-G formula based on SCr of 0.79 mg/dL).  Liver Function Tests: Recent Labs  Lab 07/13/21 1208 07/14/21 0530 07/15/21 0455 07/16/21 0715 07/17/21 0550  AST 80* 75* 76* 66* 59*  ALT 91* 83* 82* 78* 67*  ALKPHOS 244* 193* 196* 182* 167*  BILITOT 0.4 0.2* 0.2* 0.2* 0.2*  PROT  10.1* 8.1 8.1 7.4 7.0  ALBUMIN 4.0 3.2* 3.4* 3.0* 2.7*    CBG: No results for input(s): GLUCAP in the last 168 hours.   Recent Results (from the past 240 hour(s))  CSF culture     Status: None   Collection Time: 07/13/21  4:11 PM   Specimen: CSF; Cerebrospinal Fluid  Result Value Ref Range Status   Specimen Description   Final    CSF Performed at [facility] [address], [place], [place] [address]    Special Requests   Final    NONE Performed at [facility], [place] [address], [place], [place] [address]    Gram Stain   Final    NO WBC SEEN NO ORGANISMS SEEN CYTOSPIN SMEAR Gram Stain Report Called to,Read Back By and Verified  With: H.YOUNT, RN AT 1826 ON 02.01.23 BY N.THOMPSON    Culture   Final    NO GROWTH 3 DAYS Performed at [facility], [place] [address], [place], [place] [address]    Report Status 07/17/2021 FINAL  Final  Culture, fungus without smear     Status: None (Preliminary result)   Collection Time: 07/13/21  4:11 PM   Specimen: CSF; Other  Result Value Ref Range Status   Specimen Description   Final    CSF Performed at [place] [address] [place], [place] [address]    Special Requests   Final    NONE Performed at [facility], [place] [address], [place], [place] [address]    Culture   Final    No Fungi Isolated in 4 Weeks Performed at [facility], [place] [address], [place], [place] [address]    Report Status PENDING  Incomplete  Resp Panel by RT-PCR (Flu A&B, Covid) Nasopharyngeal Swab     Status: None   Collection Time: 07/13/21  4:17 PM   Specimen: Nasopharyngeal Swab; Nasopharyngeal(NP) swabs in vial transport medium  Result Value Ref Range Status   SARS Coronavirus 2 by RT PCR NEGATIVE NEGATIVE Final    Comment: (NOTE) SARS-CoV-2 target nucleic acids are NOT DETECTED.  The SARS-CoV-2 RNA is generally detectable in upper respiratory specimens during  the acute phase of infection. The lowest concentration of SARS-CoV-2 viral copies this assay can detect is 138 copies/mL. A negative result does not preclude SARS-Cov-2 infection and should not be used as the sole basis for treatment or other patient management decisions. A negative result may occur with  improper specimen collection/handling, submission of specimen other than nasopharyngeal swab, presence of viral mutation(s) within the areas targeted by this assay, and inadequate number of viral copies(<138 copies/mL). A negative result must be combined with clinical observations, patient history, and epidemiological information. The expected result is Negative.  Fact Sheet for Patients:  EntrepreneurPulse.com.au  Fact Sheet for Healthcare Providers:  IncredibleEmployment.be  This test is no t yet approved or cleared by the [place] FDA and  has been authorized for detection and/or diagnosis of SARS-CoV-2 by FDA under an Emergency Use Authorization (EUA). This EUA will remain  in effect (meaning this test can be used) for the duration of the COVID-19 declaration under Section 564(b)(1) of the Act, 21 U.S.C.section 360bbb-3(b)(1), unless the authorization is terminated  or revoked sooner.       Influenza A by PCR NEGATIVE NEGATIVE Final   Influenza B by PCR NEGATIVE NEGATIVE Final    Comment: (NOTE) The Xpert Xpress SARS-CoV-2/FLU/RSV plus assay is intended as an aid in the diagnosis of influenza from Nasopharyngeal swab specimens and should not be used as a sole basis for treatment. Nasal washings and aspirates are unacceptable for Xpert Xpress SARS-CoV-2/FLU/RSV testing.  Fact Sheet for Patients: EntrepreneurPulse.com.au  Fact Sheet for Healthcare Providers: IncredibleEmployment.be  This test is not yet approved or cleared by the [place] FDA and has been authorized for detection and/or  diagnosis of SARS-CoV-2 by FDA under an Emergency Use Authorization (EUA). This EUA will remain in effect (meaning this test can be used) for the duration of the COVID-19 declaration under Section 564(b)(1) of the Act, 21 U.S.C. section 360bbb-3(b)(1), unless the authorization is terminated or revoked.  Performed at [facility], [place] [address], [place], [place] [address]   Culture, blood (routine x 2)  Status: None (Preliminary result)   Collection Time: 07/14/21  9:01 PM   Specimen: BLOOD RIGHT HAND  Result Value Ref Range Status   Specimen Description   Final    BLOOD RIGHT HAND Performed at [place] [address] [place], [place] [address]    Special Requests   Final    BOTTLES DRAWN AEROBIC ONLY Blood Culture adequate volume Performed at [place] [address] [place], [place] [address]    Culture   Final    NO GROWTH 3 DAYS Performed at [facility], [place] [address], [place], [place] [address]    Report Status PENDING  Incomplete  Culture, blood (routine x 2)     Status: None (Preliminary result)   Collection Time: 07/14/21  9:01 PM   Specimen: BLOOD RIGHT WRIST  Result Value Ref Range Status   Specimen Description   Final    BLOOD RIGHT WRIST Performed at [place] [address], [place], [place] [address]    Special Requests   Final    BOTTLES DRAWN AEROBIC ONLY Blood Culture adequate volume Performed at [place] [address] [place], [place] [address]    Culture   Final    NO GROWTH 3 DAYS Performed at [facility], [place] [address], [place], [place] [address]    Report Status PENDING  Incomplete  Urine Culture     Status: Abnormal   Collection Time: 07/15/21  8:09 PM   Specimen: Urine, Clean Catch  Result Value Ref Range Status   Specimen Description   Final    URINE, CLEAN CATCH Performed at [facility],  [place] [address], [place], [place] [address]    Special Requests   Final    NONE Performed at [facility], [place] [address] [place], [place] [address]    Culture (A)  Final    <10,000 COLONIES/mL INSIGNIFICANT GROWTH Performed at [facility] [address], [place], [place] [address]    Report Status 07/17/2021 FINAL  Final      Radiology Studies: No results found.   Scheduled Meds:  aspirin  81 mg Oral Daily   azithromycin  500 mg Oral QHS   bictegravir-emtricitabine-tenofovir AF  1 tablet Oral Daily   enoxaparin (LOVENOX) injection  40 mg Subcutaneous Q24H   ethambutol  400 mg Oral Daily   feeding supplement  237 mL Oral BID BM   fluticasone furoate-vilanterol  1 puff Inhalation Daily   megestrol  625 mg Oral Daily   mirtazapine  7.5 mg Oral QHS   multivitamin with minerals  1 tablet Oral Daily   pantoprazole  40 mg Oral Daily   polyethylene glycol  17 g Oral Daily   QUEtiapine  200 mg Oral QHS   senna-docusate  1 tablet Oral BID   sulfamethoxazole-trimethoprim  1 tablet Oral Daily   umeclidinium bromide  1 puff Inhalation Daily   Continuous Infusions:     LOS: 4 days    Flora Lipps, MD [facility] 07/18/2021, 3:00 PM

## 2021-07-18 NOTE — Progress Notes (Signed)
Inpatient Rehab Admissions Coordinator:   I do not yet have a bed for this Pt. On CIR. Note pt. With intermittent fevers the past few days so potentially not medically stable for [place]. I did speak with pt.'s mother, who states that while she can assist while Pt.'s aunt, Olivia Mackie, is at work, she will not be the patient's primary caregiver. She gave me Tracy's phone number and I left a voicemail for her with request for callback.  Clemens Catholic, [place], [place] Admissions Coordinator  [phone] ([place]) [phone] (office)

## 2021-07-18 NOTE — Assessment & Plan Note (Addendum)
Continue antiretroviral treatment.

## 2021-07-18 NOTE — Progress Notes (Signed)
Physical Therapy Treatment Patient Details Name: Kurt Foley MRN: [id number] DOB: [birth year] Today's Date: 07/18/2021   History of Present Illness Patient admitted with progressive weakness and failure to thrive. Recent d/c from [facility] after biopsy of the brain showed JC virus /progressive multifocal leukoencephalopathy. PMH significant for HIV/AIDS, CVA with R sided weakness, disseminated MAC, and depression.    PT Comments    Patient making steady progress with PT. Session dovetailed with OT to increase activity tolerance to back to back sessions. Pt completed repeat sit<>stands with heavy reliance on Lt UE/LE for power up and balance. Patient required mod assist for gait progression to improve stability of Rt knee and prevent hyperextension as well as progress walker safely. EOS exercises for Rt LE strength and coordination completed with light resistance to knee ext/flex. Pt will benefit from skilled PT in intense rehab setting. Recommend AIR to maximize independence with mobility.      Recommendations for follow up therapy are one component of a multi-disciplinary discharge planning process, led by the attending physician.  Recommendations may be updated based on patient status, additional functional criteria and insurance authorization.  Follow Up Recommendations  Acute inpatient rehab (3hours/day)     Assistance Recommended at Discharge Frequent or constant Supervision/Assistance  Patient can return home with the following Assistance with cooking/housework;Direct supervision/assist for medications management;Direct supervision/assist for financial management;Assist for transportation;A lot of help with walking and/or transfers;A lot of help with bathing/dressing/bathroom   Equipment Recommendations  Wheelchair (measurements PT);Wheelchair cushion (measurements PT);Rolling walker (2 wheels)    Recommendations for Other Services Rehab consult;OT consult     Precautions /  Restrictions Precautions Precautions: Fall Precaution Comments: R side hemi Required Braces or Orthoses: Splint/Cast Splint/Cast: aircast on R ankle, R resting hand splint Restrictions Weight Bearing Restrictions: No     Mobility  Bed Mobility               General bed mobility comments: pt OOB in recliner    Transfers Overall transfer level: Needs assistance Equipment used: Rolling walker (2 wheels) Transfers: Sit to/from Stand Sit to Stand: Min assist, From elevated surface, Min guard           General transfer comment: min guard/assist for power up from recliner. pt using Lt UE to press up and steady with SL power up with Lt LE. completed 5x from recliner.    Ambulation/Gait Ambulation/Gait assistance: Mod assist, +2 safety/equipment Gait Distance (Feet): 55 Feet Assistive device: Right platform walker Gait Pattern/deviations: Step-to pattern, Decreased stride length, Decreased step length - left, Knee hyperextension - right, Ataxic Gait velocity: decr     General Gait Details: Mod assist to steady balance and position Rt UE on platform walker. Pt's Rt knee hyperextending in stance and manual blocking to prevent hyperextension. assist to progress walker. Chair follow for safety.   Stairs             Wheelchair Mobility    Modified Rankin (Stroke Patients Only)       Balance Overall balance assessment: Needs assistance Sitting-balance support: Feet supported Sitting balance-Leahy Scale: Fair     Standing balance support: During functional activity, Bilateral upper extremity supported, Reliant on assistive device for balance Standing balance-Leahy Scale: Poor                              Cognition Arousal/Alertness: Awake/alert Behavior During Therapy: WFL for tasks assessed/performed Overall Cognitive Status: Within Functional Limits  for tasks assessed                                          Exercises  General Exercises - Lower Extremity Long Arc Quad: AROM, Right, 10 reps, Seated (with light resistance to facilitate coordination; 2x5 reps) Other Exercises Other Exercises: 5x sit<>stand from recliner    General Comments General comments (skin integrity, edema, etc.): vss, splint doffed per pt request per protocol      Pertinent Vitals/Pain Pain Assessment Pain Assessment: Faces Faces Pain Scale: No hurt Pain Intervention(s): Monitored during session, Limited activity within patient's tolerance    Home Living                          Prior Function            PT Goals (current goals can now be found in the care plan section) Acute Rehab PT Goals Patient Stated Goal: Want to do more therapy so I can walk better, more normal PT Goal Formulation: With patient Time For Goal Achievement: 07/29/21 Potential to Achieve Goals: Fair Progress towards PT goals: Progressing toward goals    Frequency    Min 3X/week      PT Plan Current plan remains appropriate    Co-evaluation              AM-PAC PT "6 Clicks" Mobility   Outcome Measure  Help needed turning from your back to your side while in a flat bed without using bedrails?: None Help needed moving from lying on your back to sitting on the side of a flat bed without using bedrails?: A Little Help needed moving to and from a bed to a chair (including a wheelchair)?: A Little Help needed standing up from a chair using your arms (e.g., wheelchair or bedside chair)?: A Little Help needed to walk in hospital room?: A Lot Help needed climbing 3-5 steps with a railing? : A Lot 6 Click Score: 17    End of Session Equipment Utilized During Treatment: Gait belt Activity Tolerance: Patient tolerated treatment well Patient left: in chair;with call bell/phone within reach;with chair alarm set Nurse Communication: Mobility status PT Visit Diagnosis: Unsteadiness on feet (R26.81);Other abnormalities of gait and  mobility (R26.89);History of falling (Z91.81);Muscle weakness (generalized) (M62.81);Ataxic gait (R26.0);Other symptoms and signs involving the nervous system (R29.898)     Time: 8891-6945 PT Time Calculation (min) (ACUTE ONLY): 23 min  Charges:  $Gait Training: 8-22 mins $Therapeutic Exercise: 8-22 mins                     Verner Mould, DPT Acute Rehabilitation Services Office [phone] Pager [phone]    Jacques Navy 07/18/2021, 1:30 PM

## 2021-07-18 NOTE — Assessment & Plan Note (Addendum)
Continue azithromycin ethambutol.  ID saw the patient during hospitalization.  Motrin as needed for pain and fever.  Would benefit from follow-up with ID during rehab stay/post rehab stay if fever persists.

## 2021-07-18 NOTE — Assessment & Plan Note (Addendum)
poor prognosis.  Palliative care has been consulted, encourage oral nutrition.  We will continue Megace on discharge.

## 2021-07-18 NOTE — Assessment & Plan Note (Addendum)
Continue nutritional support.  Patient was on Megace trial at home which has been resumed.  On Remeron.  Patient states low appetite.  Dietary saw the patient.  We will continue on Ensure

## 2021-07-18 NOTE — Progress Notes (Signed)
Occupational Therapy Treatment Patient Details Name: Kurt Foley MRN: [id number] DOB: Aug 10, 1975 Today's Date: 07/18/2021   History of present illness Patient admitted with progressive weakness and failure to thrive. 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 PMH significant for HIV/AIDS, CVA with R sided weakness, disseminated MAC, and depression.   OT comments  Chart reviewed, pt seen for OT tx on this date. Pt greeted in bed, agreeable to tx session. Pt reports R hand resting hand splint as been on for more than 4 hours, requests to doff. R hand with noted build up, hand cleaned as well as splint with MAX A. Pt is aware of splint protocol and reports it appropriately to this therapist. Handoff provided to PT at end of session as well as nurse via secure chat regarding protocol. Tx session targeted progressing functional mobility and independence in ADL tasks to facilitate return to PLOF. Pt performs supine> sit with MOD I with HOB raised, STS with MIN A, ambulatory transfer with R platform RW with MIN A. Noted improvements in transfer technique. Pt is left in care of PT, NAD, all needs met. Continue to recommend AIR to address functional deficits. OT will continue to follow acutely.    Recommendations for follow up therapy are one component of a multi-disciplinary discharge planning process, led by the attending physician.  Recommendations may be updated based on patient status, additional functional criteria and insurance authorization.    Follow Up Recommendations  Acute inpatient rehab (3hours/day)    Assistance Recommended at Discharge Frequent or constant Supervision/Assistance  Patient can return home with the following  A lot of help with walking and/or transfers;A lot of help with bathing/dressing/bathroom;Assist for transportation;Direct supervision/assist for financial management;Assistance with  cooking/housework;Direct supervision/assist for medications management;Help with stairs or ramp for entrance   Equipment Recommendations  Other (comment) (per next venue of care)    Recommendations for Other Services      Precautions / Restrictions Precautions Precautions: Fall Precaution Comments: R side hemi Required Braces or Orthoses: Splint/Cast Splint/Cast: aircast on R ankle, R resting hand splint Restrictions Weight Bearing Restrictions: No       Mobility Bed Mobility Overal bed mobility: Modified Independent             General bed mobility comments: with HOB raised    Transfers Overall transfer level: Needs assistance Equipment used: Rolling walker (2 wheels), Right platform walker Transfers: Sit to/from Stand Sit to Stand: Min assist, From elevated surface                 Balance Overall balance assessment: Needs assistance Sitting-balance support: Feet supported Sitting balance-Leahy Scale: Good     Standing balance support: During functional activity, Single extremity supported Standing balance-Leahy Scale: Fair                             ADL either performed or assessed with clinical judgement   ADL Overall ADL's : Needs assistance/impaired     Grooming: Wash/dry hands;Wash/dry face;Sitting;Minimal assistance;Moderate assistance               Lower Body Dressing: Sit to/from stand;Sitting/lateral leans;Maximal assistance Lower Body Dressing Details (indicate cue type and reason): shoes Toilet Transfer: Minimal assistance;Rolling walker (2 wheels) Toilet Transfer Details (indicate cue type and reason): simulated with R platform RW to chair         Functional mobility during ADLs: Minimal  assistance General ADL Comments: with air cast      Cognition Arousal/Alertness: Awake/alert Behavior During Therapy: WFL for tasks assessed/performed Overall Cognitive Status: Difficult to assess                                  General Comments: continued slow processing, dysarthria noted              General Comments vss, splint doffed per pt request per protocol    Pertinent Vitals/ Pain       Pain Assessment Pain Assessment: 0-10 Pain Score: 10-Worst pain ever Pain Location: back Pain Descriptors / Indicators: Aching Pain Intervention(s): Limited activity within patient's tolerance, Repositioned, Monitored during session, Patient requesting pain meds-RN notified, Other (comment) (agreeable to OT, RN notfied re: pain level)   Frequency  Min 2X/week        Progress Toward Goals  OT Goals(current goals can now be found in the care plan section)  Progress towards OT goals: Progressing toward goals  Acute Rehab OT Goals Patient Stated Goal: to walk with walker OT Goal Formulation: With patient Time For Goal Achievement: 08/01/21 Potential to Achieve Goals: Good  Plan Discharge plan remains appropriate       AM-PAC OT "6 Clicks" Daily Activity     Outcome Measure   Help from another person eating meals?: A Little Help from another person taking care of personal grooming?: A Little Help from another person toileting, which includes using toliet, bedpan, or urinal?: A Lot Help from another person bathing (including washing, rinsing, drying)?: A Lot Help from another person to put on and taking off regular upper body clothing?: A Lot Help from another person to put on and taking off regular lower body clothing?: A Lot 6 Click Score: 14    End of Session Equipment Utilized During Treatment: Gait belt;Rolling walker (2 wheels)  OT Visit Diagnosis: Unsteadiness on feet (R26.81);Hemiplegia and hemiparesis Hemiplegia - Right/Left: Right   Activity Tolerance Patient tolerated treatment well   Patient Left in chair;with call bell/phone within reach;with chair alarm set;Other (comment) (with PT)   Nurse Communication Mobility status        Time: [phone] OT Time  Calculation (min): 25 min  Charges: OT General Charges $OT Visit: 1 Visit OT Treatments $Self Care/Home Management : 8-22 mins $Therapeutic Activity: 8-22 mins  Shanon Payor, OTD OTR/L  07/18/21, 9:51 AM

## 2021-07-18 NOTE — Assessment & Plan Note (Addendum)
With residual right-sided weakness.  Continue supportive care.  Continue aspirin and Lipitor

## 2021-07-18 NOTE — Hospital Course (Addendum)
Kurt Foley is a 46 y.o. 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  Patient had been living with his aunt who was having difficulty taking care of him.  In the ED, patient was mildly tachycardic.  Patient did have lymphopenia, chronic hyponatremia and mildly elevated LFTs.  ED provider had spoken with ID who recommended ophthalmology and palliative care consultation.  He was then admitted hospital for further evaluation and treatment.  Following conditions were addressed during hospitalization.

## 2021-07-18 NOTE — Assessment & Plan Note (Addendum)
Patient with history of HIV/PML, disseminated MAC.  CD4 count less than 35.  ID on board and recommend continuation of Biktarvy, Bactrim for PJP prophylaxis and azithromycin ethambutol for disseminated MAC.  Status post LP done in the ED. JC virus PCR negative.  CSF culture negative so far.  Blood cultures negative in 4 days.  Hepatitis panel negative.  Fungal culture and CSF negative so far.  CSF VDRL was nonreactive.

## 2021-07-18 NOTE — Assessment & Plan Note (Addendum)
Resolved at this time.  Latest sodium of 135.

## 2021-07-19 DIAGNOSIS — E876 Hypokalemia: Secondary | ICD-10-CM

## 2021-07-19 LAB — CBC WITH DIFFERENTIAL/PLATELET
Abs Immature Granulocytes: 0.15 10*3/uL — ABNORMAL HIGH (ref 0.00–0.07)
Basophils Absolute: 0 10*3/uL (ref 0.0–0.1)
Basophils Relative: 1 %
Eosinophils Absolute: 0.1 10*3/uL (ref 0.0–0.5)
Eosinophils Relative: 3 %
HCT: 26.6 % — ABNORMAL LOW (ref 39.0–52.0)
Hemoglobin: 8.5 g/dL — ABNORMAL LOW (ref 13.0–17.0)
Immature Granulocytes: 5 %
Lymphocytes Relative: 11 %
Lymphs Abs: 0.3 10*3/uL — ABNORMAL LOW (ref 0.7–4.0)
MCH: 27.8 pg (ref 26.0–34.0)
MCHC: 32 g/dL (ref 30.0–36.0)
MCV: 86.9 fL (ref 80.0–100.0)
Monocytes Absolute: 0.2 10*3/uL (ref 0.1–1.0)
Monocytes Relative: 8 %
Neutro Abs: 2 10*3/uL (ref 1.7–7.7)
Neutrophils Relative %: 72 %
Platelets: 301 10*3/uL (ref 150–400)
RBC: 3.06 MIL/uL — ABNORMAL LOW (ref 4.22–5.81)
RDW: 16.3 % — ABNORMAL HIGH (ref 11.5–15.5)
WBC: 2.8 10*3/uL — ABNORMAL LOW (ref 4.0–10.5)
nRBC: 0.7 % — ABNORMAL HIGH (ref 0.0–0.2)

## 2021-07-19 LAB — BASIC METABOLIC PANEL
Anion gap: 9 (ref 5–15)
BUN: 7 mg/dL (ref 6–20)
CO2: 17 mmol/L — ABNORMAL LOW (ref 22–32)
Calcium: 8.8 mg/dL — ABNORMAL LOW (ref 8.9–10.3)
Chloride: 108 mmol/L (ref 98–111)
Creatinine, Ser: 0.75 mg/dL (ref 0.61–1.24)
GFR, Estimated: >60 mL/min (ref >60)
Glucose, Bld: 155 mg/dL — ABNORMAL HIGH (ref 70–99)
Potassium: 3.2 mmol/L — ABNORMAL LOW (ref 3.5–5.1)
Sodium: 134 mmol/L — ABNORMAL LOW (ref 135–145)

## 2021-07-19 MED ORDER — HYDROCODONE-ACETAMINOPHEN 5-325 MG PO TABS
1.0000 | ORAL_TABLET | Freq: Four times a day (QID) | ORAL | Status: DC | PRN
  Administered 2021-07-19 – 2021-07-20 (×2): 1 via ORAL
  Filled 2021-07-19 (×3): qty 1

## 2021-07-19 MED ORDER — POTASSIUM CHLORIDE CRYS ER 20 MEQ PO TBCR
40.0000 meq | EXTENDED_RELEASE_TABLET | Freq: Once | ORAL | Status: AC
  Administered 2021-07-19: 40 meq via ORAL
  Filled 2021-07-19: qty 2

## 2021-07-19 NOTE — Assessment & Plan Note (Addendum)
Improved with replacement.  Latest potassium 4.1

## 2021-07-19 NOTE — Plan of Care (Signed)

## 2021-07-19 NOTE — Progress Notes (Signed)
Inpatient Rehab Admissions Coordinator:   I do not have a bed for this Pt. On CIR today. Per Pt. And patient's mother, the plan is for patient to go live with his Tarry Kos. I have not yet been able to contact [place] to confirm that that is the plan. Pt.'s disposition needs to be confirmed before I can accept pt. To CIR.   Megan Salon, MS, CCC-SLP Rehab Admissions Coordinator  [phone] (celll) [phone] (office)

## 2021-07-19 NOTE — Progress Notes (Signed)
PROGRESS NOTE    Kurt Foley  ONG:[id number] DOB: 03-30-1976 DOA: 07/13/2021 PCP: Patient, No Pcp Per (Inactive)    Brief Narrative:  Kurt Foley is a 46 y.o. 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  Patient had been living with his aunt who was having difficulty taking care of him.  In the ED, patient was mildly tachycardic.  Patient did have lymphopenia, chronic hyponatremia and mildly elevated LFTs.  ED provider had spoken with ID who recommended ophthalmology and palliative care consultation.  He was then admitted hospital for further evaluation and treatment.       Assessment and Plan: * FTT (failure to thrive) in adult- (present on admission)  poor prognosis.  Palliative care has been consulted, encourage oral nutrition.  Megace has been added to the regimen.  Hypokalemia Potassium of 3.2 today.  Will replace orally.  Check levels in a.m.  Hyponatremia Latest sodium of 134.  We will continue to monitor.  Plasma cortisol was 7.8.  Check BMP in AM.  Malnutrition of moderate degree Continue nutritional support.  Patient was on Megace trial at home which has been resumed.  On Remeron.  Patient states low appetite.  On Ensure   CVA (cerebral vascular accident) ([place])- (present on admission) With residual right-sided weakness.  Continue supportive care.  Aspirin has been resumed.  Takes Lipitor at home  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[place])- (present on admission) ID on board.  Continue antiretroviral treatment.  AIDS (acquired immune deficiency syndrome) ([place]) Patient with history of HIV/PML, disseminated MAC.  CD4 count less than 35.  ID on board and recommend continuation of Biktarvy, Bactrim for PJP  prophylaxis and azithromycin ethambutol for disseminated MAC.  Status post LP done in the ED. JC virus PCR negative.  CSF culture negative so far.  Blood cultures negative in 4 days.  Hepatitis panel negative.  Fungal culture and CSF negative so far.  CSF VDRL was nonreactive.  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HCC) Continue azithromycin ethambutol.  ID on board.  Less fevers today.  On Motrin as needed pain    DVT prophylaxis: enoxaparin (LOVENOX) injection 40 mg Start: 07/14/21 1000 SCDs Start: 07/13/21 2038   Code Status:     Code Status: Partial Code, no intubation.  Disposition:  Status is: Inpatient Remains inpatient appropriate because: Need for rehabilitation, ongoing treatment   Family Communication:  I tried to reach the patient's mother on the phone but was unable to reach her today.  Consultants:  Infectious disease Ophthalmology  Procedures:  Lumbar puncture  Antimicrobials:    Anti-infectives (From admission, onward)    Start     Dose/Rate Route Frequency Ordered Stop   07/13/21 2200  azithromycin (ZITHROMAX) tablet 500 mg        500 mg Oral Daily at bedtime 07/13/21 2018     07/13/21 2115  bictegravir-emtricitabine-tenofovir AF (BIKTARVY) 50-200-25 MG per tablet 1 tablet        1 tablet Oral Daily 07/13/21 2018     07/13/21 2115  ethambutol (MYAMBUTOL) tablet 400 mg        400 mg Oral Daily 07/13/21 2018     07/13/21 2115  sulfamethoxazole-trimethoprim (BACTRIM DS) 800-160 MG per tablet 1 tablet        1 tablet Oral Daily  07/13/21 2018         Subjective:  Today, patient was seen and examined at bedside.  Patient states that he feels okay except for low appetite.  Denies any fever, nausea, vomiting, chills or rigor.  Nursing staff reports low blood pressure after giving oxycodone.  Objective: Vitals:   07/18/21 1432 07/18/21 1935 07/19/21 0323 07/19/21 0736  BP: 1_0   Pulse: (!) 101 90 91   Resp: _1 Temp:  98.5 F (36.9 C) 97.9 F (36.6 C) 98.2 F (36.8 C)   TempSrc: Oral Oral Oral   SpO2: 97% 100% 100% 97%    Intake/Output Summary (Last 24 hours) at 07/19/2021 1040 Last data filed at 07/18/2021 1733 Gross per 24 hour  Intake 708 ml  Output --  Net 708 ml   There were no vitals filed for this visit.  Physical Examination: General:  Average built, not in obvious distress, slow to speech, slurred speech HENT:   No scleral pallor or icterus noted. Oral mucosa is moist.  Chest:   Diminished breath sounds bilaterally. No crackles or wheezes.  CVS: S1 &S2 heard. No murmur.  Regular rate and rhythm. Abdomen: Soft, nontender, nondistended.  Bowel sounds are heard.   Extremities: No cyanosis, clubbing or edema.  Peripheral pulses are palpable. Psych: Alert, awake and oriented, normal mood CNS: Right facial palsy, right upper extremity weakness more than the right lower extremity weakness Skin: Warm and dry.  No rashes noted.  Data Reviewed:   CBC: Recent Labs  Lab 07/13/21 1208 07/14/21 0530 07/14/21 1937 07/19/21 0756  WBC 4.3 3.9* 4.2 2.8*  NEUTROABS 3.2  --  2.8 2.0  HGB 10.2* 9.0* 9.9* 8.5*  HCT 31.8* 27.4* 31.1* 26.6*  MCV 88.3 87.3 88.4 86.9  PLT 542* 405* 396 161    Basic Metabolic Panel: Recent Labs  Lab 07/15/21 0455 07/16/21 0715 07/17/21 0550 07/18/21 0516 07/19/21 0854  NA 127* 129* 130* 130* 134*  K 4.2 4.1 3.9 3.8 3.2*  CL 99 102 102 105 108  CO2 18* 17* 18* 18* 17*  GLUCOSE 90 90 89 91 155*  BUN _2 CREATININE 1.04 0.97 0.90 0.79 0.75  CALCIUM 9.4 9.2 8.9 8.5* 8.8*  MG  --  1.9  --   --   --     GFR: Estimated Creatinine Clearance: 83.5 mL/min (by C-G formula based on SCr of 0.75 mg/dL).  Liver Function Tests: Recent Labs  Lab 07/13/21 1208 07/14/21 0530 07/15/21 0455 07/16/21 0715 07/17/21 0550  AST 80* 75* 76* 66* 59*  ALT 91* 83* 82* 78* 67*  ALKPHOS 244* 193* 196* 182* 167*  BILITOT 0.4 0.2* 0.2* 0.2* 0.2*  PROT 10.1* 8.1 8.1  7.4 7.0  ALBUMIN 4.0 3.2* 3.4* 3.0* 2.7*    CBG: No results for input(s): GLUCAP in the last 168 hours.   Recent Results (from the past 240 hour(s))  CSF culture     Status: None   Collection Time: 07/13/21  4:11 PM   Specimen: CSF; Cerebrospinal Fluid  Result Value Ref Range Status   Specimen Description   Final    CSF Performed at [place] [address] [place], [place] [address]    Special Requests   Final    NONE Performed at [facility], [place] [address], [place], [place] [address]    Gram Stain   Final    NO WBC SEEN NO ORGANISMS  SEEN CYTOSPIN SMEAR Gram Stain Report Called to,Read Back By and Verified With: H.YOUNT, RN AT 1826 ON 02.01.23 BY N.THOMPSON    Culture   Final    NO GROWTH 3 DAYS Performed at [facility], [place] [address], [place], [place] [address]    Report Status 07/17/2021 FINAL  Final  Culture, fungus without smear     Status: None (Preliminary result)   Collection Time: 07/13/21  4:11 PM   Specimen: CSF; Other  Result Value Ref Range Status   Specimen Description   Final    CSF Performed at [place] [address], [place], [place] [address]    Special Requests   Final    NONE Performed at [facility], [place] [address], [place], [place] [address]    Culture   Final    No Fungi Isolated in 4 Weeks Performed at [facility], [place] [address] [place], [place] [address]    Report Status PENDING  Incomplete  Resp Panel by RT-PCR (Flu A&B, Covid) Nasopharyngeal Swab     Status: None   Collection Time: 07/13/21  4:17 PM   Specimen: Nasopharyngeal Swab; Nasopharyngeal(NP) swabs in vial transport medium  Result Value Ref Range Status   SARS Coronavirus 2 by RT PCR NEGATIVE NEGATIVE Final    Comment: (NOTE) SARS-CoV-2 target nucleic acids are NOT DETECTED.  The SARS-CoV-2 RNA is generally detectable in upper respiratory specimens during the acute  phase of infection. The lowest concentration of SARS-CoV-2 viral copies this assay can detect is 138 copies/mL. A negative result does not preclude SARS-Cov-2 infection and should not be used as the sole basis for treatment or other patient management decisions. A negative result may occur with  improper specimen collection/handling, submission of specimen other than nasopharyngeal swab, presence of viral mutation(s) within the areas targeted by this assay, and inadequate number of viral copies(<138 copies/mL). A negative result must be combined with clinical observations, patient history, and epidemiological information. The expected result is Negative.  Fact Sheet for Patients:  EntrepreneurPulse.com.au  Fact Sheet for Healthcare Providers:  IncredibleEmployment.be  This test is no t yet approved or cleared by the [place] FDA and  has been authorized for detection and/or diagnosis of SARS-CoV-2 by FDA under an Emergency Use Authorization (EUA). This EUA will remain  in effect (meaning this test can be used) for the duration of the COVID-19 declaration under Section 564(b)(1) of the Act, 21 U.S.C.section 360bbb-3(b)(1), unless the authorization is terminated  or revoked sooner.       Influenza A by PCR NEGATIVE NEGATIVE Final   Influenza B by PCR NEGATIVE NEGATIVE Final    Comment: (NOTE) The Xpert Xpress SARS-CoV-2/FLU/RSV plus assay is intended as an aid in the diagnosis of influenza from Nasopharyngeal swab specimens and should not be used as a sole basis for treatment. Nasal washings and aspirates are unacceptable for Xpert Xpress SARS-CoV-2/FLU/RSV testing.  Fact Sheet for Patients: EntrepreneurPulse.com.au  Fact Sheet for Healthcare Providers: IncredibleEmployment.be  This test is not yet approved or cleared by the [place] FDA and has been authorized for detection and/or diagnosis of  SARS-CoV-2 by FDA under an Emergency Use Authorization (EUA). This EUA will remain in effect (meaning this test can be used) for the duration of the COVID-19 declaration under Section 564(b)(1) of the Act, 21 U.S.C. section 360bbb-3(b)(1), unless the authorization is terminated or revoked.  Performed at [facility], [place] [address] [place], [place]  [address]   Culture, blood (routine x 2)     Status: None (Preliminary result)   Collection Time: 07/14/21  9:01 PM   Specimen: BLOOD RIGHT HAND  Result Value Ref Range Status   Specimen Description   Final    BLOOD RIGHT HAND Performed at [place] [address] [place], [place] [address]    Special Requests   Final    BOTTLES DRAWN AEROBIC ONLY Blood Culture adequate volume Performed at [place] [address] [place], [place] [address]    Culture   Final    NO GROWTH 4 DAYS Performed at [facility], [place] [address] [place], [place] [address]    Report Status PENDING  Incomplete  Culture, blood (routine x 2)     Status: None (Preliminary result)   Collection Time: 07/14/21  9:01 PM   Specimen: BLOOD RIGHT WRIST  Result Value Ref Range Status   Specimen Description   Final    BLOOD RIGHT WRIST Performed at [place] [address] [place], [place] [address]    Special Requests   Final    BOTTLES DRAWN AEROBIC ONLY Blood Culture adequate volume Performed at [place] [address] [place], [place] [address]    Culture   Final    NO GROWTH 4 DAYS Performed at [facility], [place] [address] [place], [place] [address]    Report Status PENDING  Incomplete  Urine Culture     Status: Abnormal   Collection Time: 07/15/21  8:09 PM   Specimen: Urine, Clean Catch  Result Value Ref Range Status   Specimen Description   Final    URINE, CLEAN CATCH Performed at [facility], [place]  [address], [place], [place] [address]    Special Requests   Final    NONE Performed at [facility], [place] [address], [place], [place] [address]    Culture (A)  Final    <10,000 COLONIES/mL INSIGNIFICANT GROWTH Performed at [place] [address], [place], [place] [address]    Report Status 07/17/2021 FINAL  Final      Radiology Studies: No results found.   Scheduled Meds:  aspirin  81 mg Oral Daily   azithromycin  500 mg Oral QHS   bictegravir-emtricitabine-tenofovir AF  1 tablet Oral Daily   enoxaparin (LOVENOX) injection  40 mg Subcutaneous Q24H   ethambutol  400 mg Oral Daily   feeding supplement  237 mL Oral BID BM   fluticasone furoate-vilanterol  1 puff Inhalation Daily   megestrol  400 mg Oral Daily   mirtazapine  7.5 mg Oral QHS   multivitamin with minerals  1 tablet Oral Daily   pantoprazole  40 mg Oral Daily   polyethylene glycol  17 g Oral Daily   potassium chloride  40 mEq Oral Once   QUEtiapine  200 mg Oral QHS   senna-docusate  1 tablet Oral BID   sulfamethoxazole-trimethoprim  1 tablet Oral Daily   umeclidinium bromide  1 puff Inhalation Daily   Continuous Infusions:   LOS: 5 days    Flora Lipps, MD [facility] 07/19/2021, 10:40 AM

## 2021-07-20 ENCOUNTER — Encounter (HOSPITAL_COMMUNITY): Payer: Self-pay | Admitting: Physical Medicine & Rehabilitation

## 2021-07-20 ENCOUNTER — Encounter (HOSPITAL_COMMUNITY): Payer: Self-pay | Admitting: Internal Medicine

## 2021-07-20 ENCOUNTER — Other Ambulatory Visit: Payer: Self-pay

## 2021-07-20 ENCOUNTER — Inpatient Hospital Stay (HOSPITAL_COMMUNITY)
Admission: RE | Admit: 2021-07-20 | Discharge: 2021-08-06 | DRG: 977 | Disposition: A | Discharge status: Home / Self Care | Payer: Medicaid Other | Source: Other Acute Inpatient Hospital | Attending: Physical Medicine & Rehabilitation | Admitting: Physical Medicine & Rehabilitation

## 2021-07-20 DIAGNOSIS — R7989 Other specified abnormal findings of blood chemistry: Secondary | ICD-10-CM

## 2021-07-20 DIAGNOSIS — R627 Adult failure to thrive: Secondary | ICD-10-CM | POA: Diagnosis present

## 2021-07-20 DIAGNOSIS — R471 Dysarthria and anarthria: Secondary | ICD-10-CM | POA: Diagnosis present

## 2021-07-20 DIAGNOSIS — Z87891 Personal history of nicotine dependence: Secondary | ICD-10-CM | POA: Diagnosis not present

## 2021-07-20 DIAGNOSIS — M545 Low back pain, unspecified: Secondary | ICD-10-CM | POA: Diagnosis present

## 2021-07-20 DIAGNOSIS — I69351 Hemiplegia and hemiparesis following cerebral infarction affecting right dominant side: Secondary | ICD-10-CM

## 2021-07-20 DIAGNOSIS — N179 Acute kidney failure, unspecified: Secondary | ICD-10-CM | POA: Diagnosis not present

## 2021-07-20 DIAGNOSIS — R5381 Other malaise: Principal | ICD-10-CM

## 2021-07-20 DIAGNOSIS — E440 Moderate protein-calorie malnutrition: Secondary | ICD-10-CM | POA: Diagnosis present

## 2021-07-20 DIAGNOSIS — G4700 Insomnia, unspecified: Secondary | ICD-10-CM | POA: Diagnosis present

## 2021-07-20 DIAGNOSIS — B20 Human immunodeficiency virus [HIV] disease: Secondary | ICD-10-CM | POA: Diagnosis present

## 2021-07-20 DIAGNOSIS — E871 Hypo-osmolality and hyponatremia: Secondary | ICD-10-CM | POA: Diagnosis not present

## 2021-07-20 DIAGNOSIS — G8929 Other chronic pain: Secondary | ICD-10-CM | POA: Diagnosis present

## 2021-07-20 DIAGNOSIS — Z7951 Long term (current) use of inhaled steroids: Secondary | ICD-10-CM | POA: Diagnosis not present

## 2021-07-20 DIAGNOSIS — G479 Sleep disorder, unspecified: Secondary | ICD-10-CM | POA: Diagnosis not present

## 2021-07-20 DIAGNOSIS — Z79899 Other long term (current) drug therapy: Secondary | ICD-10-CM

## 2021-07-20 DIAGNOSIS — D709 Neutropenia, unspecified: Secondary | ICD-10-CM | POA: Diagnosis present

## 2021-07-20 DIAGNOSIS — Z681 Body mass index (BMI) 19 or less, adult: Secondary | ICD-10-CM | POA: Diagnosis not present

## 2021-07-20 DIAGNOSIS — E876 Hypokalemia: Secondary | ICD-10-CM

## 2021-07-20 DIAGNOSIS — J449 Chronic obstructive pulmonary disease, unspecified: Secondary | ICD-10-CM | POA: Diagnosis present

## 2021-07-20 DIAGNOSIS — D61818 Other pancytopenia: Secondary | ICD-10-CM | POA: Diagnosis not present

## 2021-07-20 DIAGNOSIS — G0490 Encephalitis and encephalomyelitis, unspecified: Secondary | ICD-10-CM | POA: Diagnosis not present

## 2021-07-20 DIAGNOSIS — Z597 Insufficient social insurance and welfare support: Secondary | ICD-10-CM

## 2021-07-20 DIAGNOSIS — A312 Disseminated mycobacterium avium-intracellulare complex (DMAC): Secondary | ICD-10-CM | POA: Diagnosis present

## 2021-07-20 DIAGNOSIS — R29810 Facial weakness: Secondary | ICD-10-CM | POA: Diagnosis present

## 2021-07-20 DIAGNOSIS — E860 Dehydration: Secondary | ICD-10-CM | POA: Diagnosis present

## 2021-07-20 DIAGNOSIS — Z7982 Long term (current) use of aspirin: Secondary | ICD-10-CM | POA: Diagnosis not present

## 2021-07-20 DIAGNOSIS — M25511 Pain in right shoulder: Secondary | ICD-10-CM | POA: Diagnosis present

## 2021-07-20 DIAGNOSIS — R000 Tachycardia, unspecified: Secondary | ICD-10-CM | POA: Diagnosis not present

## 2021-07-20 DIAGNOSIS — Z8661 Personal history of infections of the central nervous system: Secondary | ICD-10-CM

## 2021-07-20 LAB — BASIC METABOLIC PANEL
Anion gap: 7 (ref 5–15)
BUN: 8 mg/dL (ref 6–20)
CO2: 19 mmol/L — ABNORMAL LOW (ref 22–32)
Calcium: 9.3 mg/dL (ref 8.9–10.3)
Chloride: 109 mmol/L (ref 98–111)
Creatinine, Ser: 0.76 mg/dL (ref 0.61–1.24)
GFR, Estimated: >60 mL/min (ref >60)
Glucose, Bld: 94 mg/dL (ref 70–99)
Potassium: 4.1 mmol/L (ref 3.5–5.1)
Sodium: 135 mmol/L (ref 135–145)

## 2021-07-20 LAB — CBC WITH DIFFERENTIAL/PLATELET
Abs Immature Granulocytes: 0.13 10*3/uL — ABNORMAL HIGH (ref 0.00–0.07)
Basophils Absolute: 0 10*3/uL (ref 0.0–0.1)
Basophils Relative: 1 %
Eosinophils Absolute: 0.1 10*3/uL (ref 0.0–0.5)
Eosinophils Relative: 4 %
HCT: 26.6 % — ABNORMAL LOW (ref 39.0–52.0)
Hemoglobin: 8.5 g/dL — ABNORMAL LOW (ref 13.0–17.0)
Immature Granulocytes: 8 %
Lymphocytes Relative: 18 %
Lymphs Abs: 0.3 10*3/uL — ABNORMAL LOW (ref 0.7–4.0)
MCH: 28 pg (ref 26.0–34.0)
MCHC: 32 g/dL (ref 30.0–36.0)
MCV: 87.5 fL (ref 80.0–100.0)
Monocytes Absolute: 0.1 10*3/uL (ref 0.1–1.0)
Monocytes Relative: 7 %
Neutro Abs: 1 10*3/uL — ABNORMAL LOW (ref 1.7–7.7)
Neutrophils Relative %: 62 %
Platelets: UNDETERMINED 10*3/uL (ref 150–400)
RBC: 3.04 MIL/uL — ABNORMAL LOW (ref 4.22–5.81)
RDW: 16.4 % — ABNORMAL HIGH (ref 11.5–15.5)
WBC: 1.5 10*3/uL — ABNORMAL LOW (ref 4.0–10.5)
nRBC: 0 % (ref 0.0–0.2)

## 2021-07-20 LAB — CULTURE, BLOOD (ROUTINE X 2)
Culture: NO GROWTH
Culture: NO GROWTH
Special Requests: ADEQUATE
Special Requests: ADEQUATE

## 2021-07-20 LAB — MISC LABCORP TEST (SEND OUT): Labcorp test code: 9985

## 2021-07-20 MED ORDER — UMECLIDINIUM BROMIDE 62.5 MCG/ACT IN AEPB
1.0000 | INHALATION_SPRAY | Freq: Every day | RESPIRATORY_TRACT | Status: DC
  Administered 2021-07-21 – 2021-08-04 (×14): 1 via RESPIRATORY_TRACT
  Filled 2021-07-20 (×4): qty 7

## 2021-07-20 MED ORDER — ENSURE ENLIVE PO LIQD: 237.0000 mL | Freq: Two times a day (BID) | ORAL | Status: DC

## 2021-07-20 MED ORDER — MEGESTROL ACETATE 625 MG/5ML PO SUSP: 625.0000 mg | Freq: Every day | ORAL | Status: DC

## 2021-07-20 MED ORDER — ENSURE ENLIVE PO LIQD
237.0000 mL | Freq: Three times a day (TID) | ORAL | Status: DC
  Administered 2021-07-20 – 2021-08-06 (×45): 237 mL via ORAL

## 2021-07-20 MED ORDER — PROCHLORPERAZINE 25 MG RE SUPP
12.5000 mg | Freq: Four times a day (QID) | RECTAL | Status: DC | PRN
Start: 2021-07-20 — End: 2021-08-06

## 2021-07-20 MED ORDER — MIRTAZAPINE 15 MG PO TABS
15.0000 mg | ORAL_TABLET | Freq: Every day | ORAL | Status: DC
  Administered 2021-07-20 – 2021-07-25 (×6): 15 mg via ORAL
  Filled 2021-07-20 (×6): qty 1

## 2021-07-20 MED ORDER — LIDOCAINE 5 % EX PTCH
1.0000 | MEDICATED_PATCH | CUTANEOUS | Status: DC
  Administered 2021-07-20 – 2021-07-30 (×5): 1 via TRANSDERMAL
  Filled 2021-07-20 (×10): qty 1

## 2021-07-20 MED ORDER — GUAIFENESIN-DM 100-10 MG/5ML PO SYRP: 5.0000 mL | ORAL_SOLUTION | Freq: Four times a day (QID) | ORAL | Status: DC | PRN

## 2021-07-20 MED ORDER — ETHAMBUTOL HCL 400 MG PO TABS
400.0000 mg | ORAL_TABLET | Freq: Every day | ORAL | Status: DC
  Administered 2021-07-21 – 2021-07-26 (×6): 400 mg via ORAL
  Filled 2021-07-20 (×6): qty 1

## 2021-07-20 MED ORDER — HYDROCODONE-ACETAMINOPHEN 5-325 MG PO TABS: 1.0000 | ORAL_TABLET | Freq: Four times a day (QID) | ORAL | Status: DC | PRN

## 2021-07-20 MED ORDER — OXYCODONE HCL 5 MG PO TABS
5.0000 mg | ORAL_TABLET | Freq: Every day | ORAL | Status: DC
  Administered 2021-07-20: 10 mg via ORAL
  Administered 2021-07-21 (×2): 5 mg via ORAL
  Administered 2021-07-22: 10 mg via ORAL
  Administered 2021-07-24: 5 mg via ORAL
  Administered 2021-07-25: 10 mg via ORAL
  Administered 2021-07-26: 5 mg via ORAL
  Administered 2021-07-27 – 2021-07-28 (×2): 10 mg via ORAL
  Administered 2021-07-29: 5 mg via ORAL
  Administered 2021-07-30 – 2021-08-03 (×5): 10 mg via ORAL
  Administered 2021-08-04: 5 mg via ORAL
  Administered 2021-08-05: 10 mg via ORAL
  Filled 2021-07-20 (×11): qty 2
  Filled 2021-07-20: qty 1
  Filled 2021-07-20 (×3): qty 2

## 2021-07-20 MED ORDER — HYDROCODONE-ACETAMINOPHEN 5-325 MG PO TABS: 1.0000 | ORAL_TABLET | Freq: Four times a day (QID) | ORAL | 0 refills | Status: DC | PRN

## 2021-07-20 MED ORDER — SULFAMETHOXAZOLE-TRIMETHOPRIM 800-160 MG PO TABS
1.0000 | ORAL_TABLET | Freq: Every day | ORAL | Status: DC
  Administered 2021-07-21 – 2021-08-06 (×17): 1 via ORAL
  Filled 2021-07-20 (×17): qty 1

## 2021-07-20 MED ORDER — POLYETHYLENE GLYCOL 3350 17 G PO PACK: 17.0000 g | PACK | Freq: Every day | ORAL | Status: DC | PRN

## 2021-07-20 MED ORDER — SENNOSIDES-DOCUSATE SODIUM 8.6-50 MG PO TABS: 1.0000 | ORAL_TABLET | Freq: Two times a day (BID) | ORAL | Status: DC

## 2021-07-20 MED ORDER — DICLOFENAC SODIUM 1 % EX GEL
2.0000 g | Freq: Four times a day (QID) | CUTANEOUS | Status: DC
  Administered 2021-07-20 – 2021-08-04 (×21): 2 g via TOPICAL
  Filled 2021-07-20: qty 100

## 2021-07-20 MED ORDER — PROCHLORPERAZINE EDISYLATE 10 MG/2ML IJ SOLN: 5.0000 mg | Freq: Four times a day (QID) | INTRAMUSCULAR | Status: DC | PRN

## 2021-07-20 MED ORDER — DIPHENHYDRAMINE HCL 12.5 MG/5ML PO ELIX: 12.5000 mg | ORAL_SOLUTION | Freq: Four times a day (QID) | ORAL | Status: DC | PRN

## 2021-07-20 MED ORDER — MEGESTROL ACETATE 400 MG/10ML PO SUSP
400.0000 mg | Freq: Every day | ORAL | Status: DC
  Administered 2021-07-21 – 2021-08-06 (×17): 400 mg via ORAL
  Filled 2021-07-20 (×17): qty 10

## 2021-07-20 MED ORDER — FLEET ENEMA 7-19 GM/118ML RE ENEM: 1.0000 | ENEMA | Freq: Once | RECTAL | Status: DC | PRN

## 2021-07-20 MED ORDER — POLYETHYLENE GLYCOL 3350 17 G PO PACK: 17.0000 g | PACK | Freq: Every day | ORAL | 0 refills | Status: AC | PRN

## 2021-07-20 MED ORDER — ENOXAPARIN SODIUM 40 MG/0.4ML IJ SOSY
40.0000 mg | PREFILLED_SYRINGE | INTRAMUSCULAR | Status: DC
  Administered 2021-07-20 – 2021-08-05 (×17): 40 mg via SUBCUTANEOUS
  Filled 2021-07-20 (×17): qty 0.4

## 2021-07-20 MED ORDER — OXYCODONE HCL 5 MG PO TABS
5.0000 mg | ORAL_TABLET | ORAL | Status: DC | PRN
  Administered 2021-07-20 – 2021-07-21 (×2): 5 mg via ORAL
  Administered 2021-07-21 – 2021-07-28 (×13): 10 mg via ORAL
  Administered 2021-07-29: 5 mg via ORAL
  Administered 2021-07-30 – 2021-08-05 (×12): 10 mg via ORAL
  Administered 2021-08-05: 5 mg via ORAL
  Administered 2021-08-05 – 2021-08-06 (×3): 10 mg via ORAL
  Filled 2021-07-20 (×25): qty 2
  Filled 2021-07-20: qty 1
  Filled 2021-07-20 (×10): qty 2

## 2021-07-20 MED ORDER — QUETIAPINE FUMARATE 50 MG PO TABS
200.0000 mg | ORAL_TABLET | Freq: Every day | ORAL | Status: DC
  Administered 2021-07-20 – 2021-07-25 (×6): 200 mg via ORAL
  Filled 2021-07-20 (×6): qty 4

## 2021-07-20 MED ORDER — AZITHROMYCIN 500 MG PO TABS
500.0000 mg | ORAL_TABLET | Freq: Every day | ORAL | Status: DC
  Administered 2021-07-20 – 2021-08-05 (×17): 500 mg via ORAL
  Filled 2021-07-20 (×19): qty 1

## 2021-07-20 MED ORDER — IBUPROFEN 400 MG PO TABS: 400.0000 mg | ORAL_TABLET | Freq: Three times a day (TID) | ORAL | 0 refills | Status: DC | PRN

## 2021-07-20 MED ORDER — PANTOPRAZOLE SODIUM 40 MG PO TBEC: 40.0000 mg | DELAYED_RELEASE_TABLET | Freq: Every day | ORAL | Status: DC

## 2021-07-20 MED ORDER — BISACODYL 10 MG RE SUPP: 10.0000 mg | Freq: Every day | RECTAL | Status: DC | PRN

## 2021-07-20 MED ORDER — MIRTAZAPINE 15 MG PO TABS: 7.5000 mg | ORAL_TABLET | Freq: Every day | ORAL | Status: DC

## 2021-07-20 MED ORDER — ASPIRIN 81 MG PO CHEW
81.0000 mg | CHEWABLE_TABLET | Freq: Every day | ORAL | Status: DC
  Administered 2021-07-21 – 2021-08-06 (×17): 81 mg via ORAL
  Filled 2021-07-20 (×17): qty 1

## 2021-07-20 MED ORDER — ALUM & MAG HYDROXIDE-SIMETH 200-200-20 MG/5ML PO SUSP: 30.0000 mL | ORAL | Status: DC | PRN

## 2021-07-20 MED ORDER — SENNOSIDES-DOCUSATE SODIUM 8.6-50 MG PO TABS
1.0000 | ORAL_TABLET | Freq: Two times a day (BID) | ORAL | Status: DC
  Administered 2021-07-20 – 2021-08-06 (×30): 1 via ORAL
  Filled 2021-07-20 (×33): qty 1

## 2021-07-20 MED ORDER — PANTOPRAZOLE SODIUM 40 MG PO TBEC
40.0000 mg | DELAYED_RELEASE_TABLET | Freq: Every day | ORAL | Status: DC
  Administered 2021-07-21 – 2021-08-06 (×17): 40 mg via ORAL
  Filled 2021-07-20 (×17): qty 1

## 2021-07-20 MED ORDER — BICTEGRAVIR-EMTRICITAB-TENOFOV 50-200-25 MG PO TABS
1.0000 | ORAL_TABLET | Freq: Every day | ORAL | Status: DC
  Administered 2021-07-21 – 2021-08-06 (×17): 1 via ORAL
  Filled 2021-07-20 (×17): qty 1

## 2021-07-20 MED ORDER — ACETAMINOPHEN 325 MG PO TABS
325.0000 mg | ORAL_TABLET | ORAL | Status: DC | PRN
  Administered 2021-07-22 – 2021-07-26 (×8): 650 mg via ORAL
  Administered 2021-08-05: 325 mg via ORAL
  Administered 2021-08-05 – 2021-08-06 (×2): 650 mg via ORAL
  Filled 2021-07-20 (×2): qty 2
  Filled 2021-07-20: qty 1
  Filled 2021-07-20 (×8): qty 2

## 2021-07-20 MED ORDER — UMECLIDINIUM BROMIDE 62.5 MCG/INH IN AEPB: 1.0000 | INHALATION_SPRAY | Freq: Every day | RESPIRATORY_TRACT | Status: DC

## 2021-07-20 MED ORDER — FLUTICASONE FUROATE-VILANTEROL 100-25 MCG/ACT IN AEPB
1.0000 | INHALATION_SPRAY | Freq: Every day | RESPIRATORY_TRACT | Status: DC
  Administered 2021-07-21 – 2021-08-06 (×16): 1 via RESPIRATORY_TRACT
  Filled 2021-07-20 (×2): qty 28

## 2021-07-20 MED ORDER — PROCHLORPERAZINE MALEATE 5 MG PO TABS: 5.0000 mg | ORAL_TABLET | Freq: Four times a day (QID) | ORAL | Status: DC | PRN

## 2021-07-20 NOTE — TOC Transition Note (Signed)
Transition of Care [facility]) - CM/SW Discharge Note   Patient Details  Name: Kurt Foley MRN: [id number] Date of Birth: [birth year]  Transition of Care [facility]) CM/SW Contact:  Trish Mage, LCSW Phone Number: 07/20/2021, 10:51 AM   Clinical Narrative:   Patient to transfer to CIR via [facility] today.  TOC sign off.    Final next level of care: IP Rehab Facility Barriers to Discharge: Barriers Resolved   Patient Goals and CMS Choice        Discharge Placement                       Discharge Plan and Services                                     Social Determinants of Health (SDOH) Interventions     Readmission Risk Interventions No flowsheet data found.

## 2021-07-20 NOTE — H&P (Signed)
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 He was readmitted to [facility] on 07/13/21 with FTT and difficulty for caring of him at home (due to lack of SNF benefits).  Dr. Gale Journey consulted for input and recommended continuing MAC treatment with ophthalmology consult for visual disturbance as well as question of brain bx/LP for further work up. Patient with inability to recover CD4 count with gap in ART and hope for recovery with resumption of Biktarvy since 02/2021. Eye exam by Dr. Monica Martinez was negative for retinitis, vitritis, uveitis, opportunistic infection or ethambutol  toxicity. Palliative care consulted due to high mortality/poor prognosis with PML diagnosis and mother/aunts elected on full scope of treatment with limited code--limited code and rehab for ongoing weakness.   LP was negative organism and no fungal growth so far. Low grade fevers worked up with BC/UC which was negative. Abdominal ultrasound repeated due to abnormal LFTs and statin held as ultrasound without acute findings. Megace added to help with intake and electrolyte abnormalities have resolved. Ensure added for nutritional supplement. He continues to be limited by weakness with unsteady gait and ataxia. CIR recommended due to recent functional decline. Currently complains of right sided shoulder pain.    Review of Systems  Constitutional:  Negative for chills and fever.  HENT:  Negative for hearing loss.   Eyes:  Negative for blurred vision and pain.  Respiratory:  Negative for shortness of breath and stridor.   Cardiovascular:  Negative for chest pain.  Gastrointestinal:   Negative for abdominal pain and constipation.  Musculoskeletal:  Positive for back pain. Negative for myalgias.  Skin:  Negative for rash.  Neurological:  Positive for speech change and weakness. Negative for headaches.  Psychiatric/Behavioral:  The patient has insomnia (due to back pain).     Past Medical History:  Diagnosis Date   Depression    HIV infection ([place])     History reviewed. No pertinent surgical history.   Family History  Problem Relation Age of Onset   Rheum arthritis Mother    Diabetes Mother    Hypertension Mother    Arthritis Mother     Social History:  Lives with his aunt who is in good health. reports that he has quit smoking--7 months ago. His smoking use included cigarettes. He has a 12.50 pack-year smoking history. He has never used smokeless tobacco. He reports that he does not currently use alcohol after a past usage of about 2.0 standard drinks per week. He reports that he does not currently use drugs after having used the following drugs: Marijuana and Cocaine.   Allergies: No Known Allergies   Medications Prior to Admission  Medication Sig Dispense Refill   aspirin 81 MG chewable tablet Chew 81 mg by mouth daily.     azithromycin (ZITHROMAX) 500 MG tablet Take 500 mg by mouth daily.     bictegravir-emtricitabine-tenofovir AF (BIKTARVY) 50-200-25 MG TABS tablet Take 1 tablet by mouth daily. 30 tablet 11   ethambutol (MYAMBUTOL) 400 MG tablet Take 400 mg by mouth daily.     feeding supplement (ENSURE ENLIVE / ENSURE PLUS) LIQD Take  237 mLs by mouth 2 (two) times daily between meals.     fluticasone furoate-vilanterol (BREO ELLIPTA) 100-25 MCG/ACT AEPB Inhale 1 puff into the lungs daily. 60 each 5   HYDROcodone-acetaminophen (NORCO/VICODIN) 5-325 MG tablet Take 1 tablet by mouth every 6 (six) hours as needed for severe pain or moderate pain. 30 tablet 0   ibuprofen (ADVIL) 400 MG tablet Take 1 tablet (400 mg total) by mouth every 8 (eight) hours as  needed for fever or mild pain.  0   megestrol (MEGACE ES) 625 MG/5ML suspension Take 5 mLs (625 mg total) by mouth daily. (Patient not taking: Reported on 07/13/2021) 150 mL 2   mirtazapine (REMERON) 15 MG tablet Take 0.5 tablets (7.5 mg total) by mouth at bedtime. 15 tablet 5   Multiple Vitamin (QUINTABS) TABS Take 1 tablet by mouth daily.     pantoprazole (PROTONIX) 40 MG tablet Take 1 tablet (40 mg total) by mouth daily.     polyethylene glycol (MIRALAX / GLYCOLAX) 17 g packet Take 17 g by mouth daily as needed for mild constipation or moderate constipation.  0   QUEtiapine (SEROQUEL) 200 MG tablet Take 1 tablet (200 mg total) by mouth at bedtime. 30 tablet 5   senna-docusate (SENOKOT-S) 8.6-50 MG tablet Take 1 tablet by mouth 2 (two) times daily.     sulfamethoxazole-trimethoprim (BACTRIM DS) 800-160 MG tablet Take 1 tablet by mouth daily. 30 tablet 11   umeclidinium bromide (INCRUSE ELLIPTA) 62.5 MCG/INH AEPB Inhale 1 puff into the lungs daily. (Patient not taking: Reported on 04/21/2021) 30 each 5    Home: Home Living Family/patient expects to be discharged to:: Private residence Living Arrangements: Parent, Other relatives Available Help at Discharge: Family Type of Home: House Home Access: Stairs to enter [employer] of Steps: 3 steps to get inside Entrance Stairs-Rails: Right Home Layout: One level Bathroom Shower/Tub: Chiropodist: Standard Bathroom Accessibility: Yes Home Equipment: Conservation officer, nature (2 wheels), [employer] - single point Additional Comments: Aunt works during the day 4am-5pm. Pt states he has not been very mobile lately, uses RW at baseline. unclear of baseline cognitive status, no family present to determine.   Functional History: Prior Function Prior Level of Function : Needs assist Physical Assist : ADLs (physical) ADLs (physical): Bathing, Dressing, Toileting Mobility Comments: States that he is in bed most of the day while aunt is  at work, has attempted to be up on his own, reports history of fall- was trying to ambulate to bathroom. ADLs Comments: patient and aunt report patient was independent in ADLs at home with patient furniture walking to bathroom at home to bathroom that was across hallway from room.   Functional Status:  Mobility: Bed Mobility Overal bed mobility: Modified Independent General bed mobility comments: pt OOB in recliner Transfers Overall transfer level: Needs assistance Equipment used: Rolling walker (2 wheels) Transfers: Sit to/from Stand Sit to Stand: Min assist, From elevated surface, Min guard General transfer comment: min guard/assist for power up from recliner. pt using Lt UE to press up and steady with SL power up with Lt LE. completed 5x from recliner. Ambulation/Gait Ambulation/Gait assistance: Mod assist, +2 safety/equipment Gait Distance (Feet): 55 Feet Assistive device: Right platform walker Gait Pattern/deviations: Step-to pattern, Decreased stride length, Decreased step length - left, Knee hyperextension - right, Ataxic General Gait Details: Mod assist to steady balance and position Rt UE on platform walker. Pt's Rt knee hyperextending in stance and manual blocking to prevent hyperextension. assist to progress  walker. Chair follow for safety. Gait velocity: decr   ADL: ADL Overall ADL's : Needs assistance/impaired Eating/Feeding: Set up, Sitting Grooming: Wash/dry hands, Wash/dry face, Sitting, Minimal assistance, Moderate assistance Upper Body Bathing: Minimal assistance, Sitting Lower Body Bathing: Moderate assistance, Sit to/from stand, Sitting/lateral leans Upper Body Dressing : Minimal assistance, Sitting Lower Body Dressing: Sit to/from stand, Sitting/lateral leans, Maximal assistance Lower Body Dressing Details (indicate cue type and reason): shoes Toilet Transfer: Minimal assistance, Rolling walker (2 wheels) Toilet Transfer Details (indicate cue type and reason):  simulated with R platform RW to chair Toileting- Clothing Manipulation and Hygiene: Maximal assistance, Sit to/from stand Functional mobility during ADLs: Minimal assistance General ADL Comments: with air cast   Cognition: Cognition Overall Cognitive Status: Within Functional Limits for tasks assessed Orientation Level: Oriented X4 Cognition Arousal/Alertness: Awake/alert Behavior During Therapy: WFL for tasks assessed/performed Overall Cognitive Status: Within Functional Limits for tasks assessed General Comments: continued slow processing, dysarthria noted    Physical Exam: Blood pressure 103/76, pulse (!) 104, temperature 97.8 F (36.6 C), temperature source Oral, resp. rate 16, height _0  (1.651 m), weight 53.7 kg, SpO2 100 %. Physical Exam Vitals and nursing note reviewed.  Constitutional:      Appearance: He is cachectic.     Comments: Thin male, up in bed eating lunch. NAD.   HEENT: AC, NT Skin:    General: Skin is warm and dry.  Abdomen: NT, ND Neurological/MSK    Mental Status: He is alert and oriented to person, place, and time.     Comments: Left facial paresis with halting speech and moderate dysarthria. Right upper extremity hemiplegia 0/5, right lower extremity 3/5 except for 0/5 right lower extremity- AFO in place. Tenderness to palpation right shoulder.   Results for orders placed or performed during the hospital encounter of 07/13/21 (from the past 48 hour(s))  CBC with Differential/Platelet     Status: Abnormal   Collection Time: 07/19/21  7:56 AM  Result Value Ref Range   WBC 2.8 (L) 4.0 - 10.5 K/uL   RBC 3.06 (L) 4.22 - 5.81 MIL/uL   Hemoglobin 8.5 (L) 13.0 - 17.0 g/dL   HCT 26.6 (L) 39.0 - 52.0 %   MCV 86.9 80.0 - 100.0 fL   MCH 27.8 26.0 - 34.0 pg   MCHC 32.0 30.0 - 36.0 g/dL   RDW 16.3 (H) 11.5 - 15.5 %   Platelets 301 150 - 400 K/uL   nRBC 0.7 (H) 0.0 - 0.2 %   Neutrophils Relative % 72 %   Neutro Abs 2.0 1.7 - 7.7 K/uL   Lymphocytes Relative  11 %   Lymphs Abs 0.3 (L) 0.7 - 4.0 K/uL   Monocytes Relative 8 %   Monocytes Absolute 0.2 0.1 - 1.0 K/uL   Eosinophils Relative 3 %   Eosinophils Absolute 0.1 0.0 - 0.5 K/uL   Basophils Relative 1 %   Basophils Absolute 0.0 0.0 - 0.1 K/uL   WBC Morphology DOHLE BODIES     Comment: MILD LEFT SHIFT (1-5% METAS, OCC MYELO, OCC BANDS) TOXIC GRANULATION VACUOLATED NEUTROPHILS    Immature Granulocytes 5 %   Abs Immature Granulocytes 0.15 (H) 0.00 - 0.07 K/uL   Polychromasia PRESENT     Comment: Performed at [facility], [place] [address], [place], [place] [address]  Basic metabolic panel     Status: Abnormal   Collection Time: 07/19/21  8:54 AM  Result Value Ref Range   Sodium 134 (L) 135 - 145  mmol/L   Potassium 3.2 (L) 3.5 - 5.1 mmol/L   Chloride 108 98 - 111 mmol/L   CO2 17 (L) 22 - 32 mmol/L   Glucose, Bld 155 (H) 70 - 99 mg/dL    Comment: Glucose reference range applies only to samples taken after fasting for at least 8 hours.   BUN 7 6 - 20 mg/dL   Creatinine, Ser 0.75 0.61 - 1.24 mg/dL   Calcium 8.8 (L) 8.9 - 10.3 mg/dL   GFR, Estimated >60 >60 mL/min    Comment: (NOTE) Calculated using the CKD-EPI Creatinine Equation (2021)    Anion gap 9 5 - 15    Comment: Performed at [facility], [place] [address], [place], [place] [address]  CBC with Differential/Platelet     Status: Abnormal   Collection Time: 07/20/21  5:14 AM  Result Value Ref Range   WBC 1.5 (L) 4.0 - 10.5 K/uL   RBC 3.04 (L) 4.22 - 5.81 MIL/uL   Hemoglobin 8.5 (L) 13.0 - 17.0 g/dL   HCT 26.6 (L) 39.0 - 52.0 %   MCV 87.5 80.0 - 100.0 fL   MCH 28.0 26.0 - 34.0 pg   MCHC 32.0 30.0 - 36.0 g/dL   RDW 16.4 (H) 11.5 - 15.5 %   Platelets PLATELET CLUMPS NOTED ON SMEAR, UNABLE TO ESTIMATE 150 - 400 K/uL   nRBC 0.0 0.0 - 0.2 %   Neutrophils Relative % 62 %   Neutro Abs 1.0 (L) 1.7 - 7.7 K/uL   Lymphocytes Relative 18 %   Lymphs Abs 0.3 (L) 0.7 - 4.0 K/uL   Monocytes Relative 7 %    Monocytes Absolute 0.1 0.1 - 1.0 K/uL   Eosinophils Relative 4 %   Eosinophils Absolute 0.1 0.0 - 0.5 K/uL   Basophils Relative 1 %   Basophils Absolute 0.0 0.0 - 0.1 K/uL   WBC Morphology MILD LEFT SHIFT (1-5% METAS, OCC MYELO, OCC BANDS)     Comment: TOXIC GRANULATION   Immature Granulocytes 8 %   Abs Immature Granulocytes 0.13 (H) 0.00 - 0.07 K/uL   Polychromasia PRESENT     Comment: Performed at [facility], [place] [address] [place], [place] [address]  Basic metabolic panel     Status: Abnormal   Collection Time: 07/20/21  5:14 AM  Result Value Ref Range   Sodium 135 135 - 145 mmol/L   Potassium 4.1 3.5 - 5.1 mmol/L    Comment: DELTA CHECK NOTED NO VISIBLE HEMOLYSIS    Chloride 109 98 - 111 mmol/L   CO2 19 (L) 22 - 32 mmol/L   Glucose, Bld 94 70 - 99 mg/dL    Comment: Glucose reference range applies only to samples taken after fasting for at least 8 hours.   BUN 8 6 - 20 mg/dL   Creatinine, Ser 0.76 0.61 - 1.24 mg/dL   Calcium 9.3 8.9 - 10.3 mg/dL   GFR, Estimated >60 >60 mL/min    Comment: (NOTE) Calculated using the CKD-EPI Creatinine Equation (2021)    Anion gap 7 5 - 15    Comment: Performed at [facility], [place] [address] [place], [place] [address]   No results found.    Blood pressure 103/76, pulse (!) 104, temperature 97.8 F (36.6 C), temperature source Oral, resp. rate 16, height _0  (1.651 m), weight 53.7 kg, SpO2 100 %.  Medical Problem List and Plan: 1. 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  Antithrombotics: -DVT/anticoagulation:  Pharmaceutical: Lovenox  -antiplatelet therapy: ASA 3. Chronic LBP/Pain Management: Hydrocodone does not last as long and keeps him up at nights.   --Oxycodone 10 mg has been effective in the past-->5-10 mg at nights --Lidocaine patches at nights (discomfort due to bed).  4. Mood: LCSW to follow for evaluation and  support.   -antipsychotic agents: N/A 5. Neuropsych: This patient is  capable of making decisions on his own behalf. 6. Skin/Wound Care: Routine pressure relief measures.  7. Fluids/Electrolytes/Nutrition: Monitor I/O. Check lytes in am.  8. PML/Disseminated MAC: Continue Zithromax and Ethambutol.  9. 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 Continue Biktarvy  --on Bactrim for PJP prophylaxis.  10. Pancytopenia: Neutropenia with WBC downward trend form 2.8-->1.5  --H/H down from 10.--->8.5/26.6  --platelets clumped-->adequate? Recheck in am.  11. Insomnia: Encouraged use of Remeron and Seroquel also to help with sleep.  Increase Remeron to 25m.  --will schedule oxycodone 10 mg at nights to help with LBP/sleep 12. Tachycardia: Monitor HR/BP TID- Monitor HR with activity 13. Anorexia/FTT: Will downgrade diet to D3 as dentures are poorly fitting.   --Will need assist with  meal set up and question assistance with meals.  14. Abnormal LFTs: Continue to hold Lipitor. Will hold motrin also for now.  --Recheck LFTs in am.   15. COPD: Continue MDI.  16. Right shoulder pain: voltaren gel ordered.  17. Decreased appetite: increase remeron to 175m I have personally performed a face to face diagnostic evaluation, including, but not limited to relevant history and physical exam findings, of this patient and developed relevant assessment and plan.  Additionally, I have reviewed and concur with the physician assistant's documentation above.   KrIzora RibasMD 07/20/2021

## 2021-07-20 NOTE — Progress Notes (Addendum)
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   #HIV/AIDS -Followed by [facility] for hiv care; last seen 07/08/2021 -Resumed hiv care/art 02/2021 -- prior to that he was inconsistently taking meds.  -takes bactrim for pjp PPX  #PML -On 1/04 presented with impaired gait hospitalized in [place] with mri finding concerning for OI. He was  transferred to [facility] for biopsy brain c/w PML. Presumed unmasking of PML -CSF jc virus was negative previously -Dx carries  poor prognosis and high mortality.  -MRI on 2/2 showed finding c/w PML -LP done 2/01 unable to do cell count but normal protein and glucose/clear appearance   Current ID w/u this admission: Rpr negative Crypto ag negative/VDRL/JC/CMV negative Cytology -no malignant cells Pending: Blasto/histo Ag CSF meningoencephalitis pcr, ebv, cmv   #Disseminated MAC -02/2021 sputum and bcx positive - started on azith/ethambutol stopped 04/21/2021 but then resumed 06/16/2021 during PML brain biopsy admission at [facility]. -Repeat bcx positive for AML and had fever/chill -Tolerating his MAC regimen.  -Anticipate duration at least a year with reconstitution of immune system, although unclear if his cd4 count will recover  Recommendations: -Follow-up Blasto/histo Ag,  CSF  meningoencephalitis pcr/ebv/cmv -Given poor prognosis in the setting of PML. Palliative was consulted. Patient and family refused to engage with palliative care -Continue azithromycin and ethambutol for MAC -Continue Biktarvy and bactrim. Hospital f/u with [name] on 07/28/24 at [place].   #Fever in the setting of suspected aspiration event -Currently no cough -Encouraged to sit upright while eating to avoid aspiration    #Visual disturbance -Opthalmology consulted. SP dilated exam. No evidence of OI, ethambutol toxicity. -Recommend regular dilated fundus exam  #Rt side paresis #Hx of CVA -PT/OT -PML w/u as above Microbiology:   Antibiotics: 06/16/21 Azithromycin and ethambutol Biktarvy and bactrim   SUBJECTIVE: Pt is resting in bed.He reports he had a slight cough yesterday but now feels fine.  Interval: Fever overnight on 2/5 Review of Systems: ROS   Scheduled Meds:  aspirin  81 mg Oral Daily   azithromycin  500 mg Oral QHS   bictegravir-emtricitabine-tenofovir AF  1 tablet Oral Daily   enoxaparin (LOVENOX) injection  40 mg Subcutaneous Q24H   ethambutol  400 mg Oral Daily   feeding supplement  237 mL Oral BID BM   fluticasone furoate-vilanterol  1 puff Inhalation Daily   megestrol  400 mg Oral Daily   mirtazapine  7.5 mg Oral QHS   multivitamin with minerals  1 tablet Oral Daily   pantoprazole  40 mg Oral Daily   polyethylene glycol  17 g Oral Daily   QUEtiapine  200 mg Oral QHS   senna-docusate  1 tablet Oral BID   sulfamethoxazole-trimethoprim  1 tablet Oral Daily   umeclidinium bromide  1 puff Inhalation Daily   Continuous Infusions: PRN Meds:.HYDROcodone-acetaminophen,  ibuprofen No Known Allergies  OBJECTIVE: Vitals:   07/19/21 0736 07/19/21 1402 07/19/21 1928 07/20/21 0352  BP:  90/72 104/83 93/62  Pulse:  88 95 98  Resp:  _0 Temp:  97.7 F (36.5 C) 97.6 F (36.4 C) 97.8 F (36.6 C)  TempSrc:  Oral Oral Oral  SpO2: 97% 100% 100% 100%    There is no height or weight on file to calculate BMI.  Physical Exam    Lab Results Lab Results  Component Value Date   WBC 1.5 (L) 07/20/2021   HGB 8.5 (L) 07/20/2021   HCT 26.6 (L) 07/20/2021   MCV 87.5 07/20/2021   PLT PLATELET CLUMPS NOTED ON SMEAR, UNABLE TO ESTIMATE 07/20/2021    Lab Results  Component Value Date   CREATININE 0.76 07/20/2021   BUN 8 07/20/2021   NA 135 07/20/2021   K 4.1 07/20/2021   CL 109 07/20/2021   CO2 19 (L) 07/20/2021    Lab Results  Component Value Date   ALT 67 (H) 07/17/2021   AST 59 (H) 07/17/2021   ALKPHOS 167 (H) 07/17/2021   BILITOT 0.2 (L) 07/17/2021        Laurice Record, [place] [facility] Infectious [facility] 07/20/2021, 8:08 AM

## 2021-07-20 NOTE — Progress Notes (Signed)
Inpatient Rehab Admissions Coordinator:  ? ?I have a bed for this Pt. On CIR today. RN may call report to [phone]. ? ?Braxxton Stoudt, MS, CCC-SLP ?Rehab Admissions Coordinator  ?[phone] (celll) ?[phone] (office) ?

## 2021-07-20 NOTE — PMR Pre-admission (Signed)
PMR Admission Coordinator Pre-Admission Assessment  Patient: Kurt Foley is an 46 y.o., male MRN: [id number] DOB: 1976/03/05 Height:   Weight:    Insurance Information HMO:     PPO:      PCP:      IPA:      80/20:      OTHER:  PRIMARY: Uninsured  SECONDARY: none         Financial Counselor:       Phone#:   The [employer] for patients in Inpatient Rehabilitation Facilities with attached Privacy Act [place] Records was provided and verbally reviewed with: Patient  Emergency Contact Information Contact Information     Name Relation Home Work Mobile   [place] R Mother [phone]  [phone]   gilmer,patricia Sister   [phone]   benton,jennell Grandmother   [phone]       Current Medical History  Patient Admitting Diagnosis: Failure to thrive , JC virus/PML History of Present Illness: Kurt Foley is a 46 y.o. 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 On admission, Blood pressure 100/79, pulse (!) 102, temperature 98.5 F (36.9 C), temperature source Oral, resp. rate 14, SpO2 100 %.CBC stable close to baseline with lymphopenia, chronic hyponatremia sodium 130 close to baseline, chronic mild elevation of LFTs. .  Status post LP done in the ED. JC virus PCR negative.  CSF culture negative so far.  Blood cultures negative in 4 days.  Hepatitis panel negative.  Fungal culture and CSF negative so far.  CSF VDRL was nonreactive. Pt. 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 Pt. Seen by PT/OT who recommend CIR to assist return to PLOF.     Patient's medical  record from [facility].  has been reviewed by the rehabilitation admission coordinator and physician.  Past Medical History  Past Medical History:  Diagnosis Date   Depression    HIV infection ([place])     Has the patient had major surgery during 100 days prior to admission? Yes  Family History   family history includes Arthritis in his mother; Diabetes in his mother; Hypertension in his mother.  Current Medications  Current Facility-Administered Medications:    aspirin chewable tablet 81 mg, 81 mg, Oral, Daily, Pokhrel, Laxman, MD, 81 mg at 07/19/21 0824   azithromycin (ZITHROMAX) tablet 500 mg, 500 mg, Oral, QHS, Florencia Reasons, MD, 500 mg at 07/19/21 2114   bictegravir-emtricitabine-tenofovir AF (BIKTARVY) 50-200-25 MG per tablet 1 tablet, 1 tablet, Oral, Daily, Florencia Reasons, MD, 1 tablet at 07/19/21 0824   enoxaparin (LOVENOX) injection 40 mg, 40 mg, Subcutaneous, Q24H, Florencia Reasons, MD, 40 mg at 07/19/21 7322   ethambutol (MYAMBUTOL) tablet 400 mg, 400 mg, Oral, Daily, Florencia Reasons, MD, 400 mg at 07/19/21 0825   feeding supplement (ENSURE ENLIVE / ENSURE PLUS) liquid 237 mL, 237 mL, Oral, BID BM, Florencia Reasons, MD, 237 mL at 07/19/21 1346   fluticasone furoate-vilanterol (BREO ELLIPTA) 100-25 MCG/ACT 1 puff, 1 puff, Inhalation, Daily, Florencia Reasons, MD, 1 puff at 07/19/21 0735   HYDROcodone-acetaminophen (NORCO/VICODIN) 5-325 MG per tablet 1 tablet, 1 tablet, Oral, Q6H PRN, Pokhrel, Laxman, MD, 1 tablet at 07/19/21 1946  ibuprofen (ADVIL) tablet 400 mg, 400 mg, Oral, Q8H PRN, Pokhrel, Laxman, MD, 400 mg at 07/19/21 1652   megestrol (MEGACE) 400 MG/10ML suspension 400 mg, 400 mg, Oral, Daily, Pokhrel, Laxman, MD, 400 mg at 07/19/21 0824   mirtazapine (REMERON) tablet 7.5 mg, 7.5 mg, Oral, QHS, Florencia Reasons, MD, 7.5 mg at 07/19/21 2114   multivitamin with minerals tablet 1 tablet, 1 tablet, Oral, Daily, Florencia Reasons, MD, 1 tablet at 07/19/21 0824   pantoprazole (PROTONIX) EC tablet 40 mg, 40 mg, Oral,  Daily, Pokhrel, Laxman, MD, 40 mg at 07/19/21 0824   polyethylene glycol (MIRALAX / GLYCOLAX) packet 17 g, 17 g, Oral, Daily, Florencia Reasons, MD, 17 g at 07/18/21 1019   QUEtiapine (SEROQUEL) tablet 200 mg, 200 mg, Oral, QHS, Florencia Reasons, MD, 200 mg at 07/19/21 2114   senna-docusate (Senokot-S) tablet 1 tablet, 1 tablet, Oral, BID, Florencia Reasons, MD, 1 tablet at 07/19/21 2114   sulfamethoxazole-trimethoprim (BACTRIM DS) 800-160 MG per tablet 1 tablet, 1 tablet, Oral, Daily, Florencia Reasons, MD, 1 tablet at 07/19/21 3716   umeclidinium bromide (INCRUSE ELLIPTA) 62.5 MCG/ACT 1 puff, 1 puff, Inhalation, Daily, Pokhrel, Laxman, MD, 1 puff at 07/19/21 1346  Patients Current Diet:  Diet Order             Diet regular Room service appropriate? Yes; Fluid consistency: Thin  Diet effective now                   Precautions / Restrictions Precautions Precautions: Fall Precaution Comments: R side hemi Restrictions Weight Bearing Restrictions: No   Has the patient had 2 or more falls or a fall with injury in the past year? No  Prior Activity Level Community (5-7x/wk): Pt. was active in the community PTA  Prior Functional Level Self Care: Did the patient need help bathing, dressing, using the toilet or eating? Independent  Indoor Mobility: Did the patient need assistance with walking from room to room (with or without device)? Independent  Stairs: Did the patient need assistance with internal or external stairs (with or without device)? Independent  Functional Cognition: Did the patient need help planning regular tasks such as shopping or remembering to take medications? Independent  Patient Information Are you of Hispanic, Latino/a,or Spanish origin?: A. No, not of Hispanic, Latino/a, or Spanish origin What is your race?: B. Black or African American Do you need or want an interpreter to communicate with a doctor or health care staff?: 0. No  Patient's Response To:  Health Literacy and  Transportation Is the patient able to respond to health literacy and transportation needs?: Yes Health Literacy - How often do you need to have someone help you when you read instructions, pamphlets, or other written material from your doctor or pharmacy?: Often In the past 12 months, has lack of transportation kept you from medical appointments or from getting medications?: No In the past 12 months, has lack of transportation kept you from meetings, work, or from getting things needed for daily living?: No  Home Assistive Devices / [place] Devices/Equipment: Environmental consultant (specify type), Cane (specify quad or straight) Home Equipment: Conservation officer, nature (2 wheels), Cane - single point  Prior Device Use: Indicate devices/aids used by the patient prior to current illness, exacerbation or injury? None of the above  Current Functional Level Cognition  Overall Cognitive Status: Within Functional Limits for tasks assessed Orientation Level: Oriented X4 General Comments: continued slow processing, dysarthria noted    Extremity Assessment (includes Sensation/Coordination)  Upper  Extremity Assessment: RUE deficits/detail RUE Deficits / Details: R hemiparesis- history of CVA. Finger flexion contracture. patient has resting hand splint here at hospital. typically wears 4 hours on and 4 hours off at night. nursing to carryover. splint was trialed to fit on this date with splint fitting.  Lower Extremity Assessment: Defer to PT evaluation RLE Deficits / Details: 3 to 3+/5 throughout, difficulty maintaining neutral hip with MMT of  hip flexors- noted hip ER with active movement RLE Sensation: decreased proprioception LLE Deficits / Details: Grossly 4/5 throughout, except hip flexion 4-/5 LLE Sensation: decreased proprioception    ADLs  Overall ADL's : Needs assistance/impaired Eating/Feeding: Set up, Sitting Grooming: Wash/dry hands, Wash/dry face, Sitting, Minimal assistance, Moderate  assistance Upper Body Bathing: Minimal assistance, Sitting Lower Body Bathing: Moderate assistance, Sit to/from stand, Sitting/lateral leans Upper Body Dressing : Minimal assistance, Sitting Lower Body Dressing: Sit to/from stand, Sitting/lateral leans, Maximal assistance Lower Body Dressing Details (indicate cue type and reason): shoes Toilet Transfer: Minimal assistance, Rolling walker (2 wheels) Toilet Transfer Details (indicate cue type and reason): simulated with R platform RW to chair Toileting- Clothing Manipulation and Hygiene: Maximal assistance, Sit to/from stand Functional mobility during ADLs: Minimal assistance General ADL Comments: with air cast    Mobility  Overal bed mobility: Modified Independent General bed mobility comments: pt OOB in recliner    Transfers  Overall transfer level: Needs assistance Equipment used: Rolling walker (2 wheels) Transfers: Sit to/from Stand Sit to Stand: Min assist, From elevated surface, Min guard General transfer comment: min guard/assist for power up from recliner. pt using Lt UE to press up and steady with SL power up with Lt LE. completed 5x from recliner.    Ambulation / Gait / Stairs / Wheelchair Mobility  Ambulation/Gait Ambulation/Gait assistance: Mod assist, +2 safety/equipment Gait Distance (Feet): 55 Feet Assistive device: Right platform walker Gait Pattern/deviations: Step-to pattern, Decreased stride length, Decreased step length - left, Knee hyperextension - right, Ataxic General Gait Details: Mod assist to steady balance and position Rt UE on platform walker. Pt's Rt knee hyperextending in stance and manual blocking to prevent hyperextension. assist to progress walker. Chair follow for safety. Gait velocity: decr    Posture / Balance Dynamic Sitting Balance Sitting balance - Comments: tried to don shoe himself but was a little challenging due to the nature of the shoe and his sitting strength. The R one was very difficult  and we brought a shoe horn to assist next time. Balance Overall balance assessment: Needs assistance Sitting-balance support: Feet supported Sitting balance-Leahy Scale: Fair Sitting balance - Comments: tried to don shoe himself but was a little challenging due to the nature of the shoe and his sitting strength. The R one was very difficult and we brought a shoe horn to assist next time. Standing balance support: During functional activity, Bilateral upper extremity supported, Reliant on assistive device for balance Standing balance-Leahy Scale: Poor Standing balance comment: for static standing with one UE support    Special needs/care consideration Skin ecchymosis to the [place] (from acute therapy documentation) Living Arrangements: Parent, Other relatives Available Help at Discharge: Family Type of Home: [place]: One level Home Access: Stairs to enter Entrance Stairs-Rails: Right Entrance Stairs-Number of Steps: 3 steps to get inside Bathroom Shower/Tub: Chiropodist: Standard Bathroom Accessibility: Yes Home Care Services: No Additional Comments: Aunt works during the day 4am-5pm. Pt states he has not been very mobile lately, uses RW at  baseline. unclear of baseline cognitive status, no family present to determine.  Discharge Living Setting Plans for Discharge Living Setting: Patient's home Type of Home at Discharge: House Discharge Home Layout: One level Discharge Home Access: Level entry Discharge Bathroom Shower/Tub: Tub/shower unit Discharge Bathroom Toilet: Standard Discharge Bathroom Accessibility: Yes How Accessible: Accessible via walker Does the patient have any problems obtaining your medications?: No  Social/Family/Support Systems Patient Roles: Spouse Contact Information: [phone] Anticipated Caregiver: Olivia Mackie (aunt) Anticipated Caregiver's Contact Information: [phone] Ability/Limitations of  Caregiver: Can provide Min A Caregiver Availability: 24/7 Discharge Plan Discussed with Primary Caregiver: Yes Is Caregiver In Agreement with Plan?: Yes Does Caregiver/Family have Issues with Lodging/Transportation while Pt is in Rehab?: No  Goals Patient/Family Goal for Rehab: PT/OT Mod I, SLP Supervision Expected length of stay: 7-10 days Pt/Family Agrees to Admission and willing to participate: Yes Program Orientation Provided & Reviewed with Pt/Caregiver Including Roles  & Responsibilities: Yes  Decrease burden of Care through IP rehab admission: Specialzed equipment needs, Decrease number of caregivers, and Patient/family education  Possible need for SNF placement upon discharge: not anticipated   Patient Condition: I have reviewed medical records from [facility], spoken with CM, and patient and family member. I met with patient at the bedside for inpatient rehabilitation assessment.  Patient will benefit from ongoing PT, OT, and SLP, can actively participate in 3 hours of therapy a day 5 days of the week, and can make measurable gains during the admission.  Patient will also benefit from the coordinated team approach during an Inpatient Acute Rehabilitation admission.  The patient will receive intensive therapy as well as Rehabilitation physician, nursing, social worker, and care management interventions.  Due to safety, skin/wound care, disease management, medication administration, pain management, and patient education the patient requires 24 hour a day rehabilitation nursing.  The patient is currently min A-mod+2 with mobility and basic ADLs.  Discharge setting and therapy post discharge at home with home health is anticipated.  Patient has agreed to participate in the Acute Inpatient Rehabilitation Program and will admit today.  Preadmission Screen Completed By:  Genella Mech, 07/20/2021 8:31 AM ______________________________________________________________________    Discussed status with Dr. Ranell Patrick  on 07/20/21 at 10:00 and received approval for admission today.  Admission Coordinator:  Genella Mech, CCC-SLP, time 1000/Date 07/20/21   Assessment/Plan: Diagnosis: JC encephalitis Does the need for close, 24 hr/day Medical supervision in concert with the patient's rehab needs make it unreasonable for this patient to be served in a less intensive setting? Yes Co-Morbidities requiring supervision/potential complications: hypokalemia, failure to thrive, hyponatremia, moderate malnutrition, CVA, AIDS Due to bladder management, bowel management, safety, skin/wound care, disease management, medication administration, pain management, and patient education, does the patient require 24 hr/day rehab nursing? Yes Does the patient require coordinated care of a physician, rehab nurse, PT, OT, and SLP to address physical and functional deficits in the context of the above medical diagnosis(es)? Yes Addressing deficits in the following areas: balance, endurance, locomotion, strength, transferring, bowel/bladder control, bathing, dressing, feeding, grooming, toileting, cognition, and psychosocial support Can the patient actively participate in an intensive therapy program of at least 3 hrs of therapy 5 days a week? Yes The potential for patient to make measurable gains while on inpatient rehab is excellent Anticipated functional outcomes upon discharge from inpatient rehab: supervision PT, supervision OT, supervision SLP Estimated rehab length of stay to reach the above functional goals is: 10-14 days Anticipated discharge destination: Home 10. Overall Rehab/Functional Prognosis:  excellent   MD Signature: Leeroy Cha, MD

## 2021-07-20 NOTE — Progress Notes (Addendum)
PMR Admission Coordinator Pre-Admission Assessment   Patient: Kurt Foley is an 46 y.o., male MRN: [id number] DOB: [birth year] Height:   Weight:     Insurance Information HMO:     PPO:      PCP:      IPA:      80/20:      OTHER:  PRIMARY: Uninsured   SECONDARY: none          Financial Counselor:       Phone#:    The [employer] for patients in Inpatient Rehabilitation Facilities with attached Privacy Act [place] Records was provided and verbally reviewed with: Patient   Emergency Contact Information Contact Information       Name Relation Home Work Mobile    [place] R Mother [phone]   [phone]    gilmer,patricia Sister     [phone]    benton,jennell Grandmother     [phone]           Current Medical History  Patient Admitting Diagnosis: Failure to thrive , JC virus/PML History of Present Illness: Kurt Foley is a 46 y.o. 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 On admission, Blood pressure 100/79, pulse (!) 102, temperature 98.5 F (36.9 C), temperature source Oral, resp. rate 14, SpO2 100 %.CBC stable close to baseline with lymphopenia, chronic hyponatremia sodium 130 close to baseline, chronic mild elevation of LFTs. .  Status post LP done in the ED. JC virus PCR negative.  CSF culture negative so far.  Blood cultures negative in 4 days.  Hepatitis panel negative.  Fungal culture and CSF negative so far.  CSF VDRL was nonreactive. Pt. 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 Pt. Seen by PT/OT who recommend CIR to assist return to PLOF.     Patient's medical record from [facility] has been reviewed by the rehabilitation admission coordinator and physician.   Past Medical History      Past Medical History:  Diagnosis Date   Depression     HIV infection ([place])        Has the patient had major surgery during 100 days prior to admission? Yes   Family History   family history includes Arthritis in his mother; Diabetes in his mother; Hypertension in his mother.   Current Medications   Current Facility-Administered Medications:    aspirin chewable tablet 81 mg, 81 mg, Oral, Daily, Pokhrel, Laxman, MD, 81 mg at 07/19/21 0824   azithromycin (ZITHROMAX) tablet 500 mg, 500 mg, Oral, QHS, Florencia Reasons, MD, 500 mg at 07/19/21 2114   bictegravir-emtricitabine-tenofovir AF (BIKTARVY) 50-200-25 MG per tablet 1 tablet, 1 tablet, Oral, Daily, Florencia Reasons, MD, 1 tablet at 07/19/21 0824   enoxaparin (LOVENOX) injection 40 mg, 40 mg, Subcutaneous, Q24H, Florencia Reasons, MD, 40 mg at 07/19/21 3295   ethambutol (MYAMBUTOL) tablet 400 mg, 400 mg, Oral, Daily, Florencia Reasons, MD, 400 mg at 07/19/21 0825   feeding supplement (ENSURE ENLIVE / ENSURE PLUS) liquid 237 mL, 237 mL, Oral, BID BM, Florencia Reasons, MD, 237 mL at 07/19/21 1346   fluticasone furoate-vilanterol (BREO ELLIPTA) 100-25 MCG/ACT 1 puff, 1 puff, Inhalation, Daily, Florencia Reasons,  MD, 1 puff at 07/19/21 0735   HYDROcodone-acetaminophen (NORCO/VICODIN) 5-325 MG per tablet 1 tablet, 1 tablet, Oral, Q6H PRN, Pokhrel, Laxman, MD, 1 tablet at 07/19/21 1946   ibuprofen (ADVIL) tablet 400 mg, 400 mg, Oral, Q8H PRN, Pokhrel, Laxman, MD, 400 mg at 07/19/21 1652   megestrol (MEGACE) 400 MG/10ML suspension 400 mg, 400 mg, Oral, Daily, Pokhrel, Laxman, MD, 400 mg at 07/19/21 0824   mirtazapine (REMERON) tablet 7.5 mg, 7.5 mg, Oral, QHS, Florencia Reasons, MD, 7.5 mg at 07/19/21 2114   multivitamin with minerals tablet 1 tablet, 1 tablet, Oral, Daily, Florencia Reasons, MD, 1 tablet at 07/19/21 0824   pantoprazole (PROTONIX) EC tablet  40 mg, 40 mg, Oral, Daily, Pokhrel, Laxman, MD, 40 mg at 07/19/21 0824   polyethylene glycol (MIRALAX / GLYCOLAX) packet 17 g, 17 g, Oral, Daily, Florencia Reasons, MD, 17 g at 07/18/21 1019   QUEtiapine (SEROQUEL) tablet 200 mg, 200 mg, Oral, QHS, Florencia Reasons, MD, 200 mg at 07/19/21 2114   senna-docusate (Senokot-S) tablet 1 tablet, 1 tablet, Oral, BID, Florencia Reasons, MD, 1 tablet at 07/19/21 2114   sulfamethoxazole-trimethoprim (BACTRIM DS) 800-160 MG per tablet 1 tablet, 1 tablet, Oral, Daily, Florencia Reasons, MD, 1 tablet at 07/19/21 2703   umeclidinium bromide (INCRUSE ELLIPTA) 62.5 MCG/ACT 1 puff, 1 puff, Inhalation, Daily, Pokhrel, Laxman, MD, 1 puff at 07/19/21 1346   Patients Current Diet:  Diet Order                  Diet regular Room service appropriate? Yes; Fluid consistency: Thin  Diet effective now                         Precautions / Restrictions Precautions Precautions: Fall Precaution Comments: R side hemi Restrictions Weight Bearing Restrictions: No    Has the patient had 2 or more falls or a fall with injury in the past year? No   Prior Activity Level Community (5-7x/wk): Pt. was active in the community PTA   Prior Functional Level Self Care: Did the patient need help bathing, dressing, using the toilet or eating? Independent   Indoor Mobility: Did the patient need assistance with walking from room to room (with or without device)? Independent   Stairs: Did the patient need assistance with internal or external stairs (with or without device)? Independent   Functional Cognition: Did the patient need help planning regular tasks such as shopping or remembering to take medications? Independent   Patient Information Are you of Hispanic, Latino/a,or Spanish origin?: A. No, not of Hispanic, Latino/a, or Spanish origin What is your race?: B. Black or African American Do you need or want an interpreter to communicate with a doctor or health care staff?: 0. No   Patient's Response  To:  Health Literacy and Transportation Is the patient able to respond to health literacy and transportation needs?: Yes Health Literacy - How often do you need to have someone help you when you read instructions, pamphlets, or other written material from your doctor or pharmacy?: Often In the past 12 months, has lack of transportation kept you from medical appointments or from getting medications?: No In the past 12 months, has lack of transportation kept you from meetings, work, or from getting things needed for daily living?: No   [place] / [place] Devices/Equipment: Environmental consultant (specify type), Cane (specify quad or straight) Home Equipment: Conservation officer, nature (2 wheels), [employer] - single point   Prior Device  Use: Indicate devices/aids used by the patient prior to current illness, exacerbation or injury? None of the above   Current Functional Level Cognition   Overall Cognitive Status: Within Functional Limits for tasks assessed Orientation Level: Oriented X4 General Comments: continued slow processing, dysarthria noted    Extremity Assessment (includes Sensation/Coordination)   Upper Extremity Assessment: RUE deficits/detail RUE Deficits / Details: R hemiparesis- history of CVA. Finger flexion contracture. patient has resting hand splint here at hospital. typically wears 4 hours on and 4 hours off at night. nursing to carryover. splint was trialed to fit on this date with splint fitting.  Lower Extremity Assessment: Defer to PT evaluation RLE Deficits / Details: 3 to 3+/5 throughout, difficulty maintaining neutral hip with MMT of  hip flexors- noted hip ER with active movement RLE Sensation: decreased proprioception LLE Deficits / Details: Grossly 4/5 throughout, except hip flexion 4-/5 LLE Sensation: decreased proprioception     ADLs   Overall ADL's : Needs assistance/impaired Eating/Feeding: Set up, Sitting Grooming: Wash/dry hands, Wash/dry face, Sitting,  Minimal assistance, Moderate assistance Upper Body Bathing: Minimal assistance, Sitting Lower Body Bathing: Moderate assistance, Sit to/from stand, Sitting/lateral leans Upper Body Dressing : Minimal assistance, Sitting Lower Body Dressing: Sit to/from stand, Sitting/lateral leans, Maximal assistance Lower Body Dressing Details (indicate cue type and reason): shoes Toilet Transfer: Minimal assistance, Rolling walker (2 wheels) Toilet Transfer Details (indicate cue type and reason): simulated with R platform RW to chair Toileting- Clothing Manipulation and Hygiene: Maximal assistance, Sit to/from stand Functional mobility during ADLs: Minimal assistance General ADL Comments: with air cast     Mobility   Overal bed mobility: Modified Independent General bed mobility comments: pt OOB in recliner     Transfers   Overall transfer level: Needs assistance Equipment used: Rolling walker (2 wheels) Transfers: Sit to/from Stand Sit to Stand: Min assist, From elevated surface, Min guard General transfer comment: min guard/assist for power up from recliner. pt using Lt UE to press up and steady with SL power up with Lt LE. completed 5x from recliner.     Ambulation / Gait / Stairs / Wheelchair Mobility   Ambulation/Gait Ambulation/Gait assistance: Mod assist, +2 safety/equipment Gait Distance (Feet): 55 Feet Assistive device: Right platform walker Gait Pattern/deviations: Step-to pattern, Decreased stride length, Decreased step length - left, Knee hyperextension - right, Ataxic General Gait Details: Mod assist to steady balance and position Rt UE on platform walker. Pt's Rt knee hyperextending in stance and manual blocking to prevent hyperextension. assist to progress walker. Chair follow for safety. Gait velocity: decr     Posture / Balance Dynamic Sitting Balance Sitting balance - Comments: tried to don shoe himself but was a little challenging due to the nature of the shoe and his sitting  strength. The R one was very difficult and we brought a shoe horn to assist next time. Balance Overall balance assessment: Needs assistance Sitting-balance support: Feet supported Sitting balance-Leahy Scale: Fair Sitting balance - Comments: tried to don shoe himself but was a little challenging due to the nature of the shoe and his sitting strength. The R one was very difficult and we brought a shoe horn to assist next time. Standing balance support: During functional activity, Bilateral upper extremity supported, Reliant on assistive device for balance Standing balance-Leahy Scale: Poor Standing balance comment: for static standing with one UE support     Special needs/care consideration Skin ecchymosis to the [place] (from acute therapy documentation) Living Arrangements:  Parent, Other relatives Available Help at Discharge: Family Type of Home: House Home Layout: One level Home Access: Stairs to enter Entrance Stairs-Rails: Right Entrance Stairs-Number of Steps: 3 steps to get inside Bathroom Shower/Tub: Chiropodist: Standard Bathroom Accessibility: Yes [place]: No Additional Comments: Aunt works during the day 4am-5pm. Pt states he has not been very mobile lately, uses RW at baseline. unclear of baseline cognitive status, no family present to determine.   Discharge Living Setting Plans for Discharge Living Setting: Patient's home Type of Home at Discharge: House Discharge Home Layout: One level Discharge Home Access: Level entry Discharge Bathroom Shower/Tub: Tub/shower unit Discharge Bathroom Toilet: Standard Discharge Bathroom Accessibility: Yes How Accessible: Accessible via walker Does the patient have any problems obtaining your medications?: No   Social/Family/Support Systems Patient Roles: Spouse Contact Information: [phone] Anticipated Caregiver: Olivia Mackie (aunt) Anticipated Caregiver's Contact Information:  [phone] Ability/Limitations of Caregiver: Can provide Min A Caregiver Availability: 24/7 Discharge Plan Discussed with Primary Caregiver: Yes Is Caregiver In Agreement with Plan?: Yes Does Caregiver/Family have Issues with Lodging/Transportation while Pt is in Rehab?: No   Goals Patient/Family Goal for Rehab: PT/OT Mod I, SLP Supervision Expected length of stay: 7-10 days Pt/Family Agrees to Admission and willing to participate: Yes Program Orientation Provided & Reviewed with Pt/Caregiver Including Roles  & Responsibilities: Yes   Decrease burden of Care through IP rehab admission: Specialzed equipment needs, Decrease number of caregivers, and Patient/family education   Possible need for SNF placement upon discharge: not anticipated    Patient Condition: I have reviewed medical records from [facility], spoken with CM, and patient and family member. I met with patient at the bedside for inpatient rehabilitation assessment.  Patient will benefit from ongoing PT, OT, and SLP, can actively participate in 3 hours of therapy a day 5 days of the week, and can make measurable gains during the admission.  Patient will also benefit from the coordinated team approach during an Inpatient Acute Rehabilitation admission.  The patient will receive intensive therapy as well as Rehabilitation physician, nursing, social worker, and care management interventions.  Due to safety, skin/wound care, disease management, medication administration, pain management, and patient education the patient requires 24 hour a day rehabilitation nursing.  The patient is currently min A-mod+2 with mobility and basic ADLs.  Discharge setting and therapy post discharge at home with home health is anticipated.  Patient has agreed to participate in the Acute Inpatient Rehabilitation Program and will admit today.   Preadmission Screen Completed By:  Genella Mech, 07/20/2021 8:31  AM ______________________________________________________________________   Discussed status with Dr. Ranell Patrick  on 07/20/21 at 10:00 and received approval for admission today.   Admission Coordinator:  Genella Mech, CCC-SLP, time 1000/Date 07/20/21    Assessment/Plan: Diagnosis: JC encephalitis Does the need for close, 24 hr/day Medical supervision in concert with the patient's rehab needs make it unreasonable for this patient to be served in a less intensive setting? Yes Co-Morbidities requiring supervision/potential complications: hypokalemia, failure to thrive, hyponatremia, moderate malnutrition, CVA, AIDS Due to bladder management, bowel management, safety, skin/wound care, disease management, medication administration, pain management, and patient education, does the patient require 24 hr/day rehab nursing? Yes Does the patient require coordinated care of a physician, rehab nurse, PT, OT, and SLP to address physical and functional deficits in the context of the above medical diagnosis(es)? Yes Addressing deficits in the following areas: balance, endurance, locomotion, strength, transferring, bowel/bladder control, bathing, dressing, feeding, grooming, toileting,  cognition, and psychosocial support Can the patient actively participate in an intensive therapy program of at least 3 hrs of therapy 5 days a week? Yes The potential for patient to make measurable gains while on inpatient rehab is excellent Anticipated functional outcomes upon discharge from inpatient rehab: supervision PT, supervision OT, supervision SLP Estimated rehab length of stay to reach the above functional goals is: 10-14 days Anticipated discharge destination: Home 10. Overall Rehab/Functional Prognosis: excellent     MD Signature: Leeroy Cha

## 2021-07-20 NOTE — H&P (Shared)
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 He was readmitted to [facility] on 07/13/21 with FTT and difficulty for caring of him at home (due to lack of SNF benefits).  Dr. Gale Journey consulted for input and recommended continuing MAC treatment with ophthalmology consult for visual disturbance as well as question of brain bx/LP for further work up. Patient with inability to recover CD4 count with gap in ART and hope for recovery with resumption of Biktarvy since 02/2021. Eye exam by Dr. Monica Martinez was negative for retinitis, vitritis, uveitis, opportunistic infection or ethambutol  toxicity. Palliative care consulted due to high mortality/poor prognosis with PML diagnosis and mother/aunts elected on full scope of treatment with limited code--limited code and rehab for ongoing weakness.   LP was negative organism and no fungal growth so far. Low grade fevers worked up with BC/UC which was negative. Abdominal ultrasound repeated due to abnormal LFTs and statin held as ultrasound without acute findings. Megace added to help with intake and electrolyte abnormalities have resolved. Ensure added for nutritional supplement. He continues to be limited by weakness with unsteady gait and ataxia. CIR recommended due to recent functional decline.    Review of Systems  Constitutional:  Negative for chills and fever.  HENT:  Negative for hearing loss.   Eyes:  Negative for blurred vision and pain.  Respiratory:  Negative for shortness of breath and stridor.   Cardiovascular:  Negative for chest pain.  Gastrointestinal:  Negative for abdominal pain and constipation.   Musculoskeletal:  Positive for back pain. Negative for myalgias.  Skin:  Negative for rash.  Neurological:  Positive for speech change and weakness. Negative for headaches.  Psychiatric/Behavioral:  The patient has insomnia (due to back pain).     Past Medical History:  Diagnosis Date   Depression    HIV infection ([place])     History reviewed. No pertinent surgical history.   Family History  Problem Relation Age of Onset   Rheum arthritis Mother    Diabetes Mother    Hypertension Mother    Arthritis Mother     Social History:  Lives with his aunt who is in good health. reports that he has quit smoking--7 months ago. His smoking use included cigarettes. He has a 12.50 pack-year smoking history. He has never used smokeless tobacco. He reports that he does not currently use alcohol after a past usage of about 2.0 standard drinks per week. He reports that he does not currently use drugs after having used the following drugs: Marijuana and Cocaine.   Allergies: No Known Allergies   Medications Prior to Admission  Medication Sig Dispense Refill   aspirin 81 MG chewable tablet Chew 81 mg by mouth daily.     azithromycin (ZITHROMAX) 500 MG tablet Take 500 mg by mouth daily.     bictegravir-emtricitabine-tenofovir AF (BIKTARVY) 50-200-25 MG TABS tablet Take 1 tablet by mouth daily. 30 tablet 11   ethambutol (MYAMBUTOL) 400 MG tablet Take 400 mg by mouth daily.     feeding supplement (ENSURE ENLIVE / ENSURE PLUS) LIQD Take 237 mLs by mouth 2 (two) times  daily between meals.     fluticasone furoate-vilanterol (BREO ELLIPTA) 100-25 MCG/ACT AEPB Inhale 1 puff into the lungs daily. 60 each 5   HYDROcodone-acetaminophen (NORCO/VICODIN) 5-325 MG tablet Take 1 tablet by mouth every 6 (six) hours as needed for severe pain or moderate pain. 30 tablet 0   ibuprofen (ADVIL) 400 MG tablet Take 1 tablet (400 mg total) by mouth every 8 (eight) hours as needed for fever or mild pain.  0   megestrol  (MEGACE ES) 625 MG/5ML suspension Take 5 mLs (625 mg total) by mouth daily. (Patient not taking: Reported on 07/13/2021) 150 mL 2   mirtazapine (REMERON) 15 MG tablet Take 0.5 tablets (7.5 mg total) by mouth at bedtime. 15 tablet 5   Multiple Vitamin (QUINTABS) TABS Take 1 tablet by mouth daily.     pantoprazole (PROTONIX) 40 MG tablet Take 1 tablet (40 mg total) by mouth daily.     polyethylene glycol (MIRALAX / GLYCOLAX) 17 g packet Take 17 g by mouth daily as needed for mild constipation or moderate constipation.  0   QUEtiapine (SEROQUEL) 200 MG tablet Take 1 tablet (200 mg total) by mouth at bedtime. 30 tablet 5   senna-docusate (SENOKOT-S) 8.6-50 MG tablet Take 1 tablet by mouth 2 (two) times daily.     sulfamethoxazole-trimethoprim (BACTRIM DS) 800-160 MG tablet Take 1 tablet by mouth daily. 30 tablet 11   umeclidinium bromide (INCRUSE ELLIPTA) 62.5 MCG/INH AEPB Inhale 1 puff into the lungs daily. (Patient not taking: Reported on 04/21/2021) 30 each 5      Home: Home Living Family/patient expects to be discharged to:: Private residence Living Arrangements: Parent, Other relatives Available Help at Discharge: Family Type of Home: House Home Access: Stairs to enter [employer] of Steps: 3 steps to get inside Entrance Stairs-Rails: Right Home Layout: One level Bathroom Shower/Tub: Chiropodist: Standard Bathroom Accessibility: Yes Home Equipment: Conservation officer, nature (2 wheels), [employer] - single point Additional Comments: Aunt works during the day 4am-5pm. Pt states he has not been very mobile lately, uses RW at baseline. unclear of baseline cognitive status, no family present to determine.   Functional History: Prior Function Prior Level of Function : Needs assist Physical Assist : ADLs (physical) ADLs (physical): Bathing, Dressing, Toileting Mobility Comments: States that he is in bed most of the day while aunt is at work, has attempted to be up on his own,  reports history of fall- was trying to ambulate to bathroom. ADLs Comments: patient and aunt report patient was independent in ADLs at home with patient furniture walking to bathroom at home to bathroom that was across hallway from room.  Functional Status:  Mobility: Bed Mobility Overal bed mobility: Modified Independent General bed mobility comments: pt OOB in recliner Transfers Overall transfer level: Needs assistance Equipment used: Rolling walker (2 wheels) Transfers: Sit to/from Stand Sit to Stand: Min assist, From elevated surface, Min guard General transfer comment: min guard/assist for power up from recliner. pt using Lt UE to press up and steady with SL power up with Lt LE. completed 5x from recliner. Ambulation/Gait Ambulation/Gait assistance: Mod assist, +2 safety/equipment Gait Distance (Feet): 55 Feet Assistive device: Right platform walker Gait Pattern/deviations: Step-to pattern, Decreased stride length, Decreased step length - left, Knee hyperextension - right, Ataxic General Gait Details: Mod assist to steady balance and position Rt UE on platform walker. Pt's Rt knee hyperextending in stance and manual blocking to prevent hyperextension. assist to progress walker. Chair follow for safety. Gait  velocity: decr    ADL: ADL Overall ADL's : Needs assistance/impaired Eating/Feeding: Set up, Sitting Grooming: Wash/dry hands, Wash/dry face, Sitting, Minimal assistance, Moderate assistance Upper Body Bathing: Minimal assistance, Sitting Lower Body Bathing: Moderate assistance, Sit to/from stand, Sitting/lateral leans Upper Body Dressing : Minimal assistance, Sitting Lower Body Dressing: Sit to/from stand, Sitting/lateral leans, Maximal assistance Lower Body Dressing Details (indicate cue type and reason): shoes Toilet Transfer: Minimal assistance, Rolling walker (2 wheels) Toilet Transfer Details (indicate cue type and reason): simulated with R platform RW to  chair Toileting- Clothing Manipulation and Hygiene: Maximal assistance, Sit to/from stand Functional mobility during ADLs: Minimal assistance General ADL Comments: with air cast  Cognition: Cognition Overall Cognitive Status: Within Functional Limits for tasks assessed Orientation Level: Oriented X4 Cognition Arousal/Alertness: Awake/alert Behavior During Therapy: WFL for tasks assessed/performed Overall Cognitive Status: Within Functional Limits for tasks assessed General Comments: continued slow processing, dysarthria noted  Physical Exam: Blood pressure 93/62, pulse 98, temperature 97.8 F (36.6 C), temperature source Oral, resp. rate 20, SpO2 100 %. Physical Exam Vitals and nursing note reviewed.  Constitutional:      Appearance: He is cachectic.     Comments: Thin male, up in bed eating lunch. NAD.   Skin:    General: Skin is warm and dry.  Neurological:     Mental Status: He is alert and oriented to person, place, and time.     Comments: Left facial paresis with halting speech and moderate dysarthria. Right hemiplegia.     Results for orders placed or performed during the hospital encounter of 07/13/21 (from the past 48 hour(s))  CBC with Differential/Platelet     Status: Abnormal   Collection Time: 07/19/21  7:56 AM  Result Value Ref Range   WBC 2.8 (L) 4.0 - 10.5 K/uL   RBC 3.06 (L) 4.22 - 5.81 MIL/uL   Hemoglobin 8.5 (L) 13.0 - 17.0 g/dL   HCT 26.6 (L) 39.0 - 52.0 %   MCV 86.9 80.0 - 100.0 fL   MCH 27.8 26.0 - 34.0 pg   MCHC 32.0 30.0 - 36.0 g/dL   RDW 16.3 (H) 11.5 - 15.5 %   Platelets 301 150 - 400 K/uL   nRBC 0.7 (H) 0.0 - 0.2 %   Neutrophils Relative % 72 %   Neutro Abs 2.0 1.7 - 7.7 K/uL   Lymphocytes Relative 11 %   Lymphs Abs 0.3 (L) 0.7 - 4.0 K/uL   Monocytes Relative 8 %   Monocytes Absolute 0.2 0.1 - 1.0 K/uL   Eosinophils Relative 3 %   Eosinophils Absolute 0.1 0.0 - 0.5 K/uL   Basophils Relative 1 %   Basophils Absolute 0.0 0.0 - 0.1 K/uL    WBC Morphology DOHLE BODIES     Comment: MILD LEFT SHIFT (1-5% METAS, OCC MYELO, OCC BANDS) TOXIC GRANULATION VACUOLATED NEUTROPHILS    Immature Granulocytes 5 %   Abs Immature Granulocytes 0.15 (H) 0.00 - 0.07 K/uL   Polychromasia PRESENT     Comment: Performed at [facility], [place] [address], [place], [place] [address]  Basic metabolic panel     Status: Abnormal   Collection Time: 07/19/21  8:54 AM  Result Value Ref Range   Sodium 134 (L) 135 - 145 mmol/L   Potassium 3.2 (L) 3.5 - 5.1 mmol/L   Chloride 108 98 - 111 mmol/L   CO2 17 (L) 22 - 32 mmol/L   Glucose, Bld 155 (H) 70 - 99 mg/dL    Comment:  Glucose reference range applies only to samples taken after fasting for at least 8 hours.   BUN 7 6 - 20 mg/dL   Creatinine, Ser 0.75 0.61 - 1.24 mg/dL   Calcium 8.8 (L) 8.9 - 10.3 mg/dL   GFR, Estimated >60 >60 mL/min    Comment: (NOTE) Calculated using the CKD-EPI Creatinine Equation (2021)    Anion gap 9 5 - 15    Comment: Performed at [facility], [place] [address], [place], [place] [address]  CBC with Differential/Platelet     Status: Abnormal   Collection Time: 07/20/21  5:14 AM  Result Value Ref Range   WBC 1.5 (L) 4.0 - 10.5 K/uL   RBC 3.04 (L) 4.22 - 5.81 MIL/uL   Hemoglobin 8.5 (L) 13.0 - 17.0 g/dL   HCT 26.6 (L) 39.0 - 52.0 %   MCV 87.5 80.0 - 100.0 fL   MCH 28.0 26.0 - 34.0 pg   MCHC 32.0 30.0 - 36.0 g/dL   RDW 16.4 (H) 11.5 - 15.5 %   Platelets PLATELET CLUMPS NOTED ON SMEAR, UNABLE TO ESTIMATE 150 - 400 K/uL   nRBC 0.0 0.0 - 0.2 %   Neutrophils Relative % 62 %   Neutro Abs 1.0 (L) 1.7 - 7.7 K/uL   Lymphocytes Relative 18 %   Lymphs Abs 0.3 (L) 0.7 - 4.0 K/uL   Monocytes Relative 7 %   Monocytes Absolute 0.1 0.1 - 1.0 K/uL   Eosinophils Relative 4 %   Eosinophils Absolute 0.1 0.0 - 0.5 K/uL   Basophils Relative 1 %   Basophils Absolute 0.0 0.0 - 0.1 K/uL   WBC Morphology MILD LEFT SHIFT (1-5% METAS, OCC MYELO, OCC BANDS)      Comment: TOXIC GRANULATION   Immature Granulocytes 8 %   Abs Immature Granulocytes 0.13 (H) 0.00 - 0.07 K/uL   Polychromasia PRESENT     Comment: Performed at [facility], [place] [address] [place], [place] [address]  Basic metabolic panel     Status: Abnormal   Collection Time: 07/20/21  5:14 AM  Result Value Ref Range   Sodium 135 135 - 145 mmol/L   Potassium 4.1 3.5 - 5.1 mmol/L    Comment: DELTA CHECK NOTED NO VISIBLE HEMOLYSIS    Chloride 109 98 - 111 mmol/L   CO2 19 (L) 22 - 32 mmol/L   Glucose, Bld 94 70 - 99 mg/dL    Comment: Glucose reference range applies only to samples taken after fasting for at least 8 hours.   BUN 8 6 - 20 mg/dL   Creatinine, Ser 0.76 0.61 - 1.24 mg/dL   Calcium 9.3 8.9 - 10.3 mg/dL   GFR, Estimated >60 >60 mL/min    Comment: (NOTE) Calculated using the CKD-EPI Creatinine Equation (2021)    Anion gap 7 5 - 15    Comment: Performed at [facility], [place] [address], [place], [place] [address]   No results found.    Blood pressure 93/62, pulse 98, temperature 97.8 F (36.6 C), temperature source Oral, resp. rate 20, SpO2 100 %.  Medical Problem List and Plan: 1. Functional deficits secondary to ***  -patient may *** shower  -ELOS/Goals: *** 2.  Antithrombotics: -DVT/anticoagulation:  Pharmaceutical: Lovenox  -antiplatelet therapy: ASA 3. Chronic LBP/Pain Management: Hydrocodone does not last as long and keeps him up at nights.   --Oxycodone 10 mg has been effective in the past-->5-10 mg at nights --Lidocaine patches at nights (discomfort due to bed).  4. Mood: LCSW to follow for evaluation and support.   -antipsychotic agents: N/A 5. Neuropsych: This patient is  capable of making decisions on his own behalf. 6. Skin/Wound Care: Routine pressure relief measures.  7. Fluids/Electrolytes/Nutrition: Monitor I/O. Check lytes in am.  8. PML/Disseminated MAC: Continue Zithromax and Ethambutol.  9. AIDS:  CD4-37 with CD4 T helper 6%. Continue Biktarvy  --on Bactrim for PJP prophylaxis.  10. Pancytopenia: Neutropenia with WBC downward trend form 2.8-->1.5  --H/H down from 10.--->8.5/26.6  --platelets clumped-->adequate? Recheck in am.  11. Insomnia: Encouraged use of Remeron and Seroquel also to help with sleep.    --will schedule oxycodone 10 mg at nights to help with LBP/sleep 12. Tachycardia: Monitor HR/BP TID- Monitor HR with activity 13. Anorexia/FTT: Will downgrade diet to D3 as dentures are poorly fitting.   --Will need assist with  meal set up and question assistance with meals.  14. Abnormal LFTs: Continue to hold Lipitor. Will hold motrin also for now.  --Recheck LFTs in am.   15. COPD: Continue MDI.     ***  Bary Leriche, PA-C 07/20/2021

## 2021-07-20 NOTE — Progress Notes (Signed)
Patient is A&O x 4 and able to make his needs known. Patient C/O chronic back pain 8/10 PRN OXY 10 mg given as ordered. Upon recheck patient rates pain 3/10. Lunch tray ordered for patient less than 50 %. Encouraged snacks and fluids. Skin CDI, splints to bilateral arms and left leg. Patient flaccid on left side. Patient understands the use of the call light. Personal items with in reach. Safety maintained.

## 2021-07-20 NOTE — Progress Notes (Signed)
MD okay pt to discharge to CIR  Report called to Charge RN [place] [phone] at [phone]  Discharged via [facility]

## 2021-07-20 NOTE — Progress Notes (Signed)
Inpatient Rehabilitation Admission Medication Review by a Pharmacist  A complete drug regimen review was completed for this patient to identify any potential clinically significant medication issues.  High Risk Drug Classes Is patient taking? Indication by Medication  Antipsychotic Yes Seroquel for depression, behavior Compazine for nausea  Anticoagulant Yes Lovenox for VTE prophx  Antibiotic Yes Azithro, Biktarvy, Ehtambutol, Bactrim for AIDS, MAI, P JP prophx, disseminated MAC, JC virus/PML  Opioid Yes Hydrocodone for pain  Antiplatelet Yes ASA for CVA prophx.  Hypoglycemics/insulin No   Vasoactive Medication No   Chemotherapy No   Other Yes Megace for PCM/FTT     Type of Medication Issue Identified Description of Issue Recommendation(s)  Drug Interaction(s) (clinically significant)     Duplicate Therapy     Allergy     No Medication Administration End Date     Incorrect Dose     Additional Drug Therapy Needed  Lipitor not resumed Resume PTA Lipitor  Significant med changes from prior encounter (inform family/care partners about these prior to discharge).    Other   Resume Megace ES at discharge (nonformulary at [facility])    Clinically significant medication issues were identified that warrant physician communication and completion of prescribed/recommended actions by midnight of the next day:  No  Time spent performing this drug regimen review (minutes):  20 min  Davis Ambrosini S. Alford Highland, PharmD, BCPS Clinical Staff Pharmacist [email or web address] [place] 07/20/2021 1:21 PM

## 2021-07-20 NOTE — Discharge Summary (Signed)
Physician Discharge Summary   Patient: Kurt Foley MRN: [id number] DOB: 08/30/1975  Admit date:     07/13/2021  Discharge date: 07/20/21  Discharge Physician: Flora Lipps   PCP: Patient, No Pcp Per (Inactive)   Recommendations at discharge:   Patient would benefit from ID follow-up during rehab/post rehab stay if continues to have fever. Follow-up with PCP after rehab stay.  Discharge Diagnoses: Principal Problem:   FTT (failure to thrive) in adult Active Problems:   Kurt Foley (mycobacterium avium-intracellulare) infection (Kurt Foley)   AIDS (acquired immune deficiency syndrome) (Kurt Foley)   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HCC)   CVA (cerebral vascular accident) (Kurt Foley)   Malnutrition of moderate degree   Hyponatremia   Hypokalemia  Resolved Problems:   * No resolved hospital problems. *  Hospital Course: Kurt Foley is a 46 y.o. male with past medical history of CVA with right-sided hemiparesis, history of HIV AIDS with CD4 count less than 35, disseminated MAC infection, recently discharged from [facility] after biopsy of the brain showing JC virus/PML presented to hospital with progressive weakness slurred speech.  Patient had been living with his aunt who was having difficulty taking care of him.  In the ED, patient was mildly tachycardic.  Patient did have lymphopenia, chronic hyponatremia and mildly elevated LFTs.  ED provider had spoken with ID who recommended ophthalmology and palliative care consultation.  He was then admitted hospital for further evaluation and treatment.  Following conditions were addressed during hospitalization.  Assessment and Plan: * FTT (failure to thrive) in adult- (present on admission)  poor prognosis.  Palliative care has been consulted, encourage oral nutrition.  We will continue Megace on discharge.  Hypokalemia Improved with replacement.  Latest potassium 4.1  Hyponatremia Resolved at this time.  Latest sodium of  135.  Malnutrition of moderate degree Continue nutritional support.  Patient was on Megace trial at home which has been resumed.  On Remeron.  Patient states low appetite.  Dietary saw the patient.  We will continue on Ensure   CVA (cerebral vascular accident) [facility])- (present on admission) With residual right-sided weakness.  Continue supportive care.  Continue aspirin and Lipitor  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[facility])- (present on admission) Continue antiretroviral treatment.  AIDS (acquired immune deficiency syndrome) (Kurt Foley) Patient with history of HIV/PML, disseminated MAC.  CD4 count less than 35.  ID on board and recommend continuation of Biktarvy, Bactrim for PJP prophylaxis and azithromycin ethambutol for disseminated MAC.  Status post LP done in the ED. JC virus PCR negative.  CSF culture negative so far.  Blood cultures negative in 4 days.  Hepatitis panel negative.  Fungal culture and CSF negative so far.  CSF VDRL was nonreactive.  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  ID saw the patient during hospitalization.  Motrin as needed for pain and fever.  Would benefit from follow-up with ID during rehab stay/post rehab stay if fever persists.  Ophthalmology saw the patient during hospitalization and there was no concern for ocular involvement    Consultants: Infectious disease, Ophthalmology Procedures performed: Lumbar puncture Disposition: Rehabilitation facility Diet recommendation:  Discharge Diet Orders (From admission, onward)     Start     Ordered   07/20/21 0000  Diet general        07/20/21 1016           Regular diet  DISCHARGE MEDICATION: Allergies as of 07/20/2021   No Known Allergies      Medication List  TAKE these medications    aspirin 81 MG chewable tablet Chew 81 mg by mouth daily. What changed: Another medication with the same name was removed. Continue taking this  medication, and follow the directions you see here.   atorvastatin 40 MG tablet Commonly known as: LIPITOR Take 1 tablet (40 mg total) by mouth daily.   azithromycin 500 MG tablet Commonly known as: ZITHROMAX Take 500 mg by mouth daily.   bictegravir-emtricitabine-tenofovir AF 50-200-25 MG Tabs tablet Commonly known as: BIKTARVY Take 1 tablet by mouth daily.   ethambutol 400 MG tablet Commonly known as: MYAMBUTOL Take 400 mg by mouth daily.   feeding supplement Liqd Take 237 mLs by mouth 2 (two) times daily between meals.   fluticasone furoate-vilanterol 100-25 MCG/ACT Aepb Commonly known as: Breo Ellipta Inhale 1 puff into the lungs daily. What changed: Another medication with the same name was removed. Continue taking this medication, and follow the directions you see here.   HYDROcodone-acetaminophen 5-325 MG tablet Commonly known as: NORCO/VICODIN Take 1 tablet by mouth every 6 (six) hours as needed for severe pain or moderate pain.   ibuprofen 400 MG tablet Commonly known as: ADVIL Take 1 tablet (400 mg total) by mouth every 8 (eight) hours as needed for fever or mild pain.   megestrol 625 MG/5ML suspension Commonly known as: Megace ES Take 5 mLs (625 mg total) by mouth daily.   mirtazapine 15 MG tablet Commonly known as: Remeron Take 0.5 tablets (7.5 mg total) by mouth at bedtime.   pantoprazole 40 MG tablet Commonly known as: PROTONIX Take 1 tablet (40 mg total) by mouth daily.   polyethylene glycol 17 g packet Commonly known as: MIRALAX / GLYCOLAX Take 17 g by mouth daily as needed for mild constipation or moderate constipation.   QUEtiapine 200 MG tablet Commonly known as: SEROquel Take 1 tablet (200 mg total) by mouth at bedtime.   Quintabs Tabs Take 1 tablet by mouth daily.   senna-docusate 8.6-50 MG tablet Commonly known as: Senokot-S Take 1 tablet by mouth 2 (two) times daily.   sulfamethoxazole-trimethoprim 800-160 MG tablet Commonly known  as: BACTRIM DS Take 1 tablet by mouth daily.   umeclidinium bromide 62.5 MCG/INH Aepb Commonly known as: INCRUSE ELLIPTA Inhale 1 puff into the lungs daily.         Discharge Exam: There were no vitals filed for this visit. Vitals with BMI 07/20/2021 07/19/2021 07/19/2021  Height - - -  Weight - - -  BMI - - -  Systolic 93 956 90  Diastolic 62 83 72  Pulse 98 95 88  General:  Average built, not in obvious distress, slow to speech with slurred speech at baseline. HENT:   No scleral pallor or icterus noted. Oral mucosa is moist.  Chest:  Clear breath sounds.  Diminished breath sounds bilaterally. No crackles or wheezes.  CVS: S1 &S2 heard. No murmur.  Regular rate and rhythm. Abdomen: Soft, nontender, nondistended.  Bowel sounds are heard.   Extremities: No cyanosis, clubbing or edema.  Peripheral pulses are palpable. Psych: Alert, awake and oriented,  CNS: Right facial palsy, right upper extremity weakness more than the right lower extremity. Skin: Warm and dry.  No rashes noted.   Condition at discharge: stable  The results of significant diagnostics from this hospitalization (including imaging, microbiology, ancillary and laboratory) are listed below for reference.   Imaging Studies: CT Head Wo Contrast  Result Date: 07/13/2021 CLINICAL DATA:  Neuro deficit. History of age and  previous stroke. Reported recent brain biopsy at [facility] demonstrating PML. EXAM: CT HEAD WITHOUT CONTRAST TECHNIQUE: Contiguous axial images were obtained from the base of the skull through the vertex without intravenous contrast. RADIATION DOSE REDUCTION: This exam was performed according to the departmental dose-optimization program which includes automated exposure control, adjustment of the mA and/or kV according to patient size and/or use of iterative reconstruction technique. COMPARISON:  06/14/2021 and brain MR dated 06/15/2021 FINDINGS: Brain: No significant change in previously demonstrated  confluent white matter low density in the subcortical pre central and postcentral regions on the left. No significant change in mild patchy white matter low density elsewhere in both cerebral hemispheres and old left thalamic infarct extending inferiorly into the anterior pons on the left. No intracranial hemorrhage, mass lesion or CT evidence of acute infarction. Vascular: No hyperdense vessel or unexpected calcification. Skull: Normal. Negative for fracture or focal lesion. Sinuses/Orbits: Unremarkable. Other: None. IMPRESSION: 1. No acute abnormality. 2. Stable confluent subcortical white matter low density in the left cerebral hemisphere compatible with the reported recently diagnosed PML. 3. Stable old left thalamic lacunar infarct and minimal chronic small vessel white matter ischemic changes in both cerebral hemispheres. Electronically Signed   By: Claudie Revering M.D.   On: 07/13/2021 12:09   MR BRAIN W WO CONTRAST  Result Date: 07/14/2021 CLINICAL DATA:  HIV, PML EXAM: MRI HEAD WITHOUT AND WITH CONTRAST TECHNIQUE: Multiplanar, multiecho pulse sequences of the brain and surrounding structures were obtained without and with intravenous contrast. CONTRAST:  36m GADAVIST GADOBUTROL 1 MMOL/ML IV SOLN COMPARISON:  06/15/2021 FINDINGS: Brain: T2 hyperintensity is present in the left frontoparietal lobes involving precentral and postcentral gyri and extending inferiorly in the frontoparietal white matter to the internal capsule and cerebral peduncle. Corresponding primarily facilitated diffusion with ADC isointensity at the periphery. There is patchy enhancement as before at the deep margin and this is confined to the dominant area of signal abnormality superiorly (abnormal signal extending inferiorly may reflect wallerian degeneration). Overall extent has increased since the prior study. There is no mass effect appreciated. No new abnormal signal separate from above. Additional patchy foci of T2 hyperintensity are  again identified in cerebral white matter likely reflecting nonspecific gliosis/demyelination greater than expected for age. Vascular: Major vessel flow voids at the skull base are preserved. Skull and upper cervical spine: Normal marrow signal is preserved. Sinuses/Orbits: Paranasal sinuses are aerated. Orbits are unremarkable. Other: Sella is unremarkable.  Mastoid air cells are clear. IMPRESSION: Increased extent of left frontoparietal abnormal signal with enhancement at the deep margin as before. Appearance is consistent with reported biopsy result of PML. No new abnormal signal or enhancement elsewhere in the brain. Electronically Signed   By: PMacy MisM.D.   On: 07/14/2021 15:07   MR Lumbar Spine W Wo Contrast  Result Date: 07/16/2021 CLINICAL DATA:  Low back pain, cauda equina syndrome suspected EXAM: MRI LUMBAR SPINE WITHOUT AND WITH CONTRAST TECHNIQUE: Multiplanar and multiecho pulse sequences of the lumbar spine were obtained without and with intravenous contrast. CONTRAST:  553mGADAVIST GADOBUTROL 1 MMOL/ML IV SOLN COMPARISON:  No prior MRI, correlation is made with CT chest abdomen pelvis 06/28/2021. FINDINGS: Segmentation: Counting from the first rib-bearing vertebral body on the 06/28/2021 CT chest abdomen pelvis, there are 12 rib-bearing vertebral bodies. Therefore, there is complete sacralization of L5, with a disc space at L5-S1. Alignment:  Mild levocurvature.  No listhesis. Vertebrae: No acute fracture or suspicious osseous lesion. No abnormal enhancement. Conus medullaris  and cauda equina: Conus extends to the T12 level. Conus and cauda equina are normal. No abnormal enhancement. Paraspinal and other soft tissues: Negative. Disc levels: T12-L1: Seen only on the sagittal images. No significant disc bulge, spinal canal stenosis, or neural foraminal narrowing. L1-L2: No significant disc bulge. No spinal canal stenosis or neural foraminal narrowing. L2-L3: No significant disc bulge. No  spinal canal stenosis or neural foraminal narrowing. L3-L4: No significant disc bulge. Mild facet arthropathy. No spinal canal stenosis or neural foraminal narrowing. L4-L5: No significant disc bulge. Mild facet arthropathy. No spinal canal stenosis or neural foraminal narrowing. L5-S1: No significant disc bulge. No spinal canal stenosis or neural foraminal narrowing. IMPRESSION: 1. No spinal canal stenosis or neural foraminal narrowing. No evidence of cauda equina syndrome. 2. No abnormal osseous, conus, or cauda equina enhancement. 3. Transitional anatomy, with sacralization of L5. Please correlate with imaging if any intervention is planned. Electronically Signed   By: Merilyn Baba M.D.   On: 07/16/2021 00:34   US Abdomen Limited  Result Date: 07/15/2021 CLINICAL DATA:  A 46 year old male presents with history of elevated liver function tests. EXAM: ULTRASOUND ABDOMEN LIMITED RIGHT UPPER QUADRANT COMPARISON:  June 29, 2021. FINDINGS: Gallbladder: No gallstones or wall thickening visualized. No sonographic Murphy sign noted by sonographer. Common bile duct: Diameter: 6.6 mm top-normal but not substantially changed compared to prior MR imaging from December of 2020. Liver: Echogenic hepatic lesions compatible with small hemangiomas also seen on prior imaging in the RIGHT hepatic lobe. Portal vein is patent on color Doppler imaging with normal direction of blood flow towards the liver. Other: None. IMPRESSION: No acute findings. Stable top-normal caliber of the common bile duct remains of uncertain significance but is not substantially changed based on comparison with prior MR imaging from 2020. If there is worsening of symptoms or laboratory values repeat MRI assessment could be considered as warranted. Hepatic hemangiomas as before. Electronically Signed   By: Zetta Bills M.D.   On: 07/15/2021 16:41   DG CHEST PORT 1 VIEW  Result Date: 07/15/2021 CLINICAL DATA:  Fever. EXAM: PORTABLE CHEST 1 VIEW  COMPARISON:  June 26, 2021. FINDINGS: The heart size and mediastinal contours are within normal limits. Stable right upper lobe opacity is noted most consistent with scarring. Emphysematous disease is noted bilaterally. Minimal left basilar subsegmental atelectasis or scarring is noted. The visualized skeletal structures are unremarkable. IMPRESSION: Minimal left basilar subsegmental atelectasis or scarring. Stable right upper lobe opacity is noted most consistent with scarring. Emphysema (ICD10-J43.9). Electronically Signed   By: Marijo Conception M.D.   On: 07/15/2021 09:46    Microbiology: Results for orders placed or performed during the hospital encounter of 07/13/21  CSF culture     Status: None   Collection Time: 07/13/21  4:11 PM   Specimen: CSF; Cerebrospinal Fluid  Result Value Ref Range Status   Specimen Description   Final    CSF Performed at [facility] [address], [place], [place] [address]    Special Requests   Final    NONE Performed at [facility], [place] [address], [place], [place] [address]    Gram Stain   Final    NO WBC SEEN NO ORGANISMS SEEN CYTOSPIN SMEAR Gram Stain Report Called to,Read Back By and Verified With: H.YOUNT, RN AT 1826 ON 02.01.23 BY N.THOMPSON    Culture   Final    NO GROWTH 3 DAYS Performed at [facility], [place]  [address], [place], [place] [address]    Report Status 07/17/2021 FINAL  Final  Culture, fungus without smear     Status: None (Preliminary result)   Collection Time: 07/13/21  4:11 PM   Specimen: CSF; Other  Result Value Ref Range Status   Specimen Description   Final    CSF Performed at [facility] [address], [place], [place] [address]    Special Requests   Final    NONE Performed at [facility], [place] [address], [place], [place] [address]    Culture   Final    NO FUNGUS ISOLATED AFTER 5 DAYS Performed at [facility],  [place] [address] [place], [place] [address]    Report Status PENDING  Incomplete  Resp Panel by RT-PCR (Flu A&B, Covid) Nasopharyngeal Swab     Status: None   Collection Time: 07/13/21  4:17 PM   Specimen: Nasopharyngeal Swab; Nasopharyngeal(NP) swabs in vial transport medium  Result Value Ref Range Status   SARS Coronavirus 2 by RT PCR NEGATIVE NEGATIVE Final    Comment: (NOTE) SARS-CoV-2 target nucleic acids are NOT DETECTED.  The SARS-CoV-2 RNA is generally detectable in upper respiratory specimens during the acute phase of infection. The lowest concentration of SARS-CoV-2 viral copies this assay can detect is 138 copies/mL. A negative result does not preclude SARS-Cov-2 infection and should not be used as the sole basis for treatment or other patient management decisions. A negative result may occur with  improper specimen collection/handling, submission of specimen other than nasopharyngeal swab, presence of viral mutation(s) within the areas targeted by this assay, and inadequate number of viral copies(<138 copies/mL). A negative result must be combined with clinical observations, patient history, and epidemiological information. The expected result is Negative.  Fact Sheet for Patients:  EntrepreneurPulse.com.au  Fact Sheet for Healthcare Providers:  IncredibleEmployment.be  This test is no t yet approved or cleared by the [place] FDA and  has been authorized for detection and/or diagnosis of SARS-CoV-2 by FDA under an Emergency Use Authorization (EUA). This EUA will remain  in effect (meaning this test can be used) for the duration of the COVID-19 declaration under Section 564(b)(1) of the Act, 21 U.S.C.section 360bbb-3(b)(1), unless the authorization is terminated  or revoked sooner.       Influenza A by PCR NEGATIVE NEGATIVE Final   Influenza B by PCR NEGATIVE NEGATIVE Final    Comment: (NOTE) The Xpert Xpress SARS-CoV-2/FLU/RSV  plus assay is intended as an aid in the diagnosis of influenza from Nasopharyngeal swab specimens and should not be used as a sole basis for treatment. Nasal washings and aspirates are unacceptable for Xpert Xpress SARS-CoV-2/FLU/RSV testing.  Fact Sheet for Patients: EntrepreneurPulse.com.au  Fact Sheet for Healthcare Providers: IncredibleEmployment.be  This test is not yet approved or cleared by the [place] FDA and has been authorized for detection and/or diagnosis of SARS-CoV-2 by FDA under an Emergency Use Authorization (EUA). This EUA will remain in effect (meaning this test can be used) for the duration of the COVID-19 declaration under Section 564(b)(1) of the Act, 21 U.S.C. section 360bbb-3(b)(1), unless the authorization is terminated or revoked.  Performed at [facility], [place] [address], [place], [place] [address]   Culture, blood (routine x 2)     Status: None   Collection Time: 07/14/21  9:01 PM   Specimen: BLOOD RIGHT HAND  Result Value Ref Range Status   Specimen Description   Final  BLOOD RIGHT HAND Performed at [facility], [place] [address] [place], [place] [address]    Special Requests   Final    BOTTLES DRAWN AEROBIC ONLY Blood Culture adequate volume Performed at [facility] [address] [place], [place] [address]    Culture   Final    NO GROWTH 5 DAYS Performed at [facility], [place] [address] [place], [place] [address]    Report Status 07/20/2021 FINAL  Final  Culture, blood (routine x 2)     Status: None   Collection Time: 07/14/21  9:01 PM   Specimen: BLOOD RIGHT WRIST  Result Value Ref Range Status   Specimen Description   Final    BLOOD RIGHT WRIST Performed at [facility] [address] [place], [place] [address]    Special Requests   Final    BOTTLES DRAWN AEROBIC ONLY Blood Culture adequate volume Performed  at [facility] [address], [place], [place] [address]    Culture   Final    NO GROWTH 5 DAYS Performed at [facility], [place] [address], [place], [place] [address]    Report Status 07/20/2021 FINAL  Final  Urine Culture     Status: Abnormal   Collection Time: 07/15/21  8:09 PM   Specimen: Urine, Clean Catch  Result Value Ref Range Status   Specimen Description   Final    URINE, CLEAN CATCH Performed at [facility], [place] [address] [place], [place] [address]    Special Requests   Final    NONE Performed at [facility], [place] [address], [place], [place] [address]    Culture (A)  Final    <10,000 COLONIES/mL INSIGNIFICANT GROWTH Performed at [facility] [address], [place], [place] [address]    Report Status 07/17/2021 FINAL  Final    Labs: CBC: Recent Labs  Lab 07/13/21 1208 07/14/21 0530 07/14/21 1937 07/19/21 0756 07/20/21 0514  WBC 4.3 3.9* 4.2 2.8* 1.5*  NEUTROABS 3.2  --  2.8 2.0 1.0*  HGB 10.2* 9.0* 9.9* 8.5* 8.5*  HCT 31.8* 27.4* 31.1* 26.6* 26.6*  MCV 88.3 87.3 88.4 86.9 87.5  PLT 542* 405* 396 301 PLATELET CLUMPS NOTED ON SMEAR, UNABLE TO ESTIMATE   Basic Metabolic Panel: Recent Labs  Lab 07/16/21 0715 07/17/21 0550 07/18/21 0516 07/19/21 0854 07/20/21 0514  NA 129* 130* 130* 134* 135  K 4.1 3.9 3.8 3.2* 4.1  CL 102 102 105 108 109  CO2 17* 18* 18* 17* 19*  GLUCOSE 90 89 91 155* 94  BUN _0 CREATININE 0.97 0.90 0.79 0.75 0.76  CALCIUM 9.2 8.9 8.5* 8.8* 9.3  MG 1.9  --   --   --   --    Liver Function Tests: Recent Labs  Lab 07/13/21 1208 07/14/21 0530 07/15/21 0455 07/16/21 0715 07/17/21 0550  AST 80* 75* 76* 66* 59*  ALT 91* 83* 82* 78* 67*  ALKPHOS 244* 193* 196* 182* 167*  BILITOT 0.4 0.2* 0.2* 0.2* 0.2*  PROT 10.1* 8.1 8.1 7.4 7.0  ALBUMIN 4.0 3.2* 3.4* 3.0* 2.7*   CBG: No results for input(s): GLUCAP in the last 168 hours.  Discharge time  spent: greater than 30 minutes.  Signed: Flora Lipps, MD [facility] 07/20/2021

## 2021-07-21 DIAGNOSIS — D61818 Other pancytopenia: Secondary | ICD-10-CM

## 2021-07-21 DIAGNOSIS — G479 Sleep disorder, unspecified: Secondary | ICD-10-CM

## 2021-07-21 DIAGNOSIS — G0490 Encephalitis and encephalomyelitis, unspecified: Secondary | ICD-10-CM

## 2021-07-21 DIAGNOSIS — B20 Human immunodeficiency virus [HIV] disease: Secondary | ICD-10-CM

## 2021-07-21 LAB — CBC WITH DIFFERENTIAL/PLATELET
Abs Immature Granulocytes: 0 10*3/uL (ref 0.00–0.07)
Basophils Absolute: 0.1 10*3/uL (ref 0.0–0.1)
Basophils Relative: 2 %
Eosinophils Absolute: 0 10*3/uL (ref 0.0–0.5)
Eosinophils Relative: 0 %
HCT: 24 % — ABNORMAL LOW (ref 39.0–52.0)
Hemoglobin: 8 g/dL — ABNORMAL LOW (ref 13.0–17.0)
Lymphocytes Relative: 10 %
Lymphs Abs: 0.4 10*3/uL — ABNORMAL LOW (ref 0.7–4.0)
MCH: 28.3 pg (ref 26.0–34.0)
MCHC: 33.3 g/dL (ref 30.0–36.0)
MCV: 84.8 fL (ref 80.0–100.0)
Monocytes Absolute: 0.4 10*3/uL (ref 0.1–1.0)
Monocytes Relative: 10 %
Neutro Abs: 2.7 10*3/uL (ref 1.7–7.7)
Neutrophils Relative %: 78 %
Platelets: 325 10*3/uL (ref 150–400)
RBC: 2.83 MIL/uL — ABNORMAL LOW (ref 4.22–5.81)
RDW: 16.5 % — ABNORMAL HIGH (ref 11.5–15.5)
WBC: 3.5 10*3/uL — ABNORMAL LOW (ref 4.0–10.5)
nRBC: 0 % (ref 0.0–0.2)
nRBC: 1 /100 WBC — ABNORMAL HIGH

## 2021-07-21 LAB — COMPREHENSIVE METABOLIC PANEL
ALT: 57 U/L — ABNORMAL HIGH (ref 0–44)
AST: 45 U/L — ABNORMAL HIGH (ref 15–41)
Albumin: 2.6 g/dL — ABNORMAL LOW (ref 3.5–5.0)
Alkaline Phosphatase: 171 U/L — ABNORMAL HIGH (ref 38–126)
Anion gap: 11 (ref 5–15)
BUN: 5 mg/dL — ABNORMAL LOW (ref 6–20)
CO2: 19 mmol/L — ABNORMAL LOW (ref 22–32)
Calcium: 9.4 mg/dL (ref 8.9–10.3)
Chloride: 102 mmol/L (ref 98–111)
Creatinine, Ser: 0.78 mg/dL (ref 0.61–1.24)
GFR, Estimated: >60 mL/min (ref >60)
Glucose, Bld: 95 mg/dL (ref 70–99)
Potassium: 4.2 mmol/L (ref 3.5–5.1)
Sodium: 132 mmol/L — ABNORMAL LOW (ref 135–145)
Total Bilirubin: 0.2 mg/dL — ABNORMAL LOW (ref 0.3–1.2)
Total Protein: 6.9 g/dL (ref 6.5–8.1)

## 2021-07-21 MED ORDER — ADULT MULTIVITAMIN W/MINERALS CH
1.0000 | ORAL_TABLET | Freq: Every day | ORAL | Status: DC
  Administered 2021-07-21 – 2021-08-06 (×16): 1 via ORAL
  Filled 2021-07-21 (×16): qty 1

## 2021-07-21 NOTE — Progress Notes (Signed)
Initial Nutrition Assessment  DOCUMENTATION CODES:   Non-severe (moderate) malnutrition in context of chronic illness  INTERVENTION:  - Continue Ensure TID, each supplement provides 350 kcal and 20 grams of protein - MVI with minerals    NUTRITION DIAGNOSIS:   Moderate Malnutrition related to chronic illness (HIV/AIDS) as evidenced by moderate fat depletion, moderate muscle depletion.   GOAL:   Patient will meet greater than or equal to 90% of their needs   MONITOR:   PO intake, Supplement acceptance, Weight trends, Labs  REASON FOR ASSESSMENT:   Malnutrition Screening Tool    ASSESSMENT:   Pt is a 46 y.o. 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 Pt recently admitted to inpatient rehab (CIR) for functional decline due to MAC/PML in the setting of AIDS.   Pt was sitting up in chair during visit with pt. Per pt, pt reports that his appetite has been "up and down." Pt reports that he ate 3/4 of his breakfast today which consisted of pancakes and one sausage. Pt denies any chewing or swallowing difficulties. Per pt, pt reports that it has been helpful having his food chopped, bite-sized for his meals. Pt also reports that he has been drinking the Ensure (strawberry-flavored) and that he likes it and has been tolerating well. Per pt, he reports a decreased appetite since his admission at [facility] and unsure if this is due to medications he is on. Pt reports that he typically eats 2-3 meals a day at baseline, and that his decreased appetite is not normal for him. Per pt, he also reports that he has had some weight loss over the last few weeks, but did not know how much weight loss. Pt  reports that he was weighed at his doctor's last month and he weighed 118#.   Discussed with pt the importance of good nutrition and adequate PO intake for energy/strength and to meet nutritional needs. Encouraged pt to continue consuming consistent meals throughout the day and drinking Ensure ONS between meals for extra calories and protein. Discussed MVI supplementation with pt and pt agreeable to taking a MVI.   Weight history reviewed.  Pt's current body weight is 118.39#. Pt's weight appears stable and has been trending up over the last 3 months. Per chart review, pt has experienced an 11.6% weight increase in the last 3 months.   Labs reviewed and include: Sodium: 132, BUN: 5,   Medications reviewed and include: Ensure Enlive TID, Megace, Remeron, Senokot-S, Protonix    NUTRITION - FOCUSED PHYSICAL EXAM:  Flowsheet Row Most Recent Value  Orbital Region Mild depletion  Upper Arm Region Severe depletion  Thoracic and Lumbar Region Moderate depletion  Buccal Region Moderate depletion  Temple Region Moderate depletion  Clavicle Bone Region Moderate depletion  Clavicle and Acromion Bone Region Moderate depletion  Scapular Bone Region Moderate depletion  Dorsal Hand Mild depletion  Patellar Region Moderate depletion  Anterior Thigh Region Moderate depletion  Posterior Calf Region Moderate depletion  Edema (RD Assessment) None  Hair Reviewed  Eyes Reviewed  Mouth Reviewed  Skin Reviewed  Nails Reviewed       Diet Order:   Diet Order  DIET DYS 3 Room service appropriate? Yes; Fluid consistency: Thin  Diet effective now                   EDUCATION NEEDS:   Education needs have been addressed  Skin:  Skin Assessment: Reviewed RN Assessment  Last BM:  2/7  Height:   Ht Readings from Last 1 Encounters:  07/20/21 _0  (1.651 m)    Weight:   Wt Readings from Last 1 Encounters:  07/20/21 53.7 kg    Ideal Body Weight:  61.8 kg  BMI:  Body mass  index is 19.7 kg/m.  Estimated Nutritional Needs:   Kcal:  1750-1950  Protein:  90 - 105 grams  Fluid:  >/= 1.8 L    Kurt Foley, Dietetic Intern 07/21/2021 2:36 PM

## 2021-07-21 NOTE — Progress Notes (Signed)
Patient is A&O x 4 and able to make his needs known. C/O lower back pain, requests PRN OXY 10 mg which has been effective x 2 today. Appetite fair, continue to encourage snacks.

## 2021-07-21 NOTE — Progress Notes (Addendum)
PROGRESS NOTE   Subjective/Complaints: Pt states he had a goo dnight. Ready to get up with therapies. Denies any pain  ROS: Patient denies fever, rash, sore throat, blurred vision, dizziness, nausea, vomiting, diarrhea, cough, shortness of breath or chest pain, joint or back/neck pain, headache, or mood change.    Objective:   No results found. Recent Labs    07/20/21 0514 07/21/21 0514  WBC 1.5* 3.5*  HGB 8.5* 8.0*  HCT 26.6* 24.0*  PLT PLATELET CLUMPS NOTED ON SMEAR, UNABLE TO ESTIMATE 325   Recent Labs    07/20/21 0514 07/21/21 0514  NA 135 132*  K 4.1 4.2  CL 109 102  CO2 19* 19*  GLUCOSE 94 95  BUN 8 5*  CREATININE 0.76 0.78  CALCIUM 9.3 9.4    Intake/Output Summary (Last 24 hours) at 07/21/2021 1497 Last data filed at 07/20/2021 1557 Gross per 24 hour  Intake 0 ml  Output --  Net 0 ml        Physical Exam: Vital Signs Blood pressure 103/76, pulse (!) 104, temperature 97.8 F (36.6 C), temperature source Oral, resp. rate 16, height _0  (1.651 m), weight 53.7 kg, SpO2 100 %.  General: Alert and oriented x 3, No apparent distress. Frail appearing HEENT: Head is normocephalic, atraumatic, PERRLA, EOMI, sclera anicteric, oral mucosa pink and moist, dentition intact, ext ear canals clear,  Neck: Supple without JVD or lymphadenopathy Heart: Reg rate and rhythm. No murmurs rubs or gallops Chest: CTA bilaterally without wheezes, rales, or rhonchi; no distress Abdomen: Soft, non-tender, non-distended, bowel sounds positive. Extremities: No clubbing, cyanosis, or edema. Pulses are 2+ Psych: Pt's affect is appropriate. Pt is cooperative Skin: Clean and intact without signs of breakdown Neuro:  Alert and oriented x 3. Normal insight and awareness. Intact Memory. Normal language. Speech dysarthric. Left facial weakness. RUE 0/5. RLE 3- prox to 0/5 distally. Decreased sensation to LT on right Musculoskeletal:  right shoulder ttp and rom    Assessment/Plan: 1. Functional deficits which require 3+ hours per day of interdisciplinary therapy in a comprehensive inpatient rehab setting. Physiatrist is providing close team supervision and 24 hour management of active medical problems listed below. Physiatrist and rehab team continue to assess barriers to discharge/monitor patient progress toward functional and medical goals  Care Tool:  Bathing              Bathing assist       Upper Body Dressing/Undressing Upper body dressing        Upper body assist      Lower Body Dressing/Undressing Lower body dressing            Lower body assist       Toileting Toileting    Toileting assist       Transfers Chair/bed transfer  Transfers assist           Locomotion Ambulation   Ambulation assist              Walk 10 feet activity   Assist           Walk 50 feet activity   Assist  Walk 150 feet activity   Assist           Walk 10 feet on uneven surface  activity   Assist           Wheelchair     Assist               Wheelchair 50 feet with 2 turns activity    Assist            Wheelchair 150 feet activity     Assist          Blood pressure 103/76, pulse (!) 104, temperature 97.8 F (36.6 C), temperature source Oral, resp. rate 16, height _0  (1.651 m), weight 53.7 kg, SpO2 100 %.  Medical Problem List and Plan: 1. 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  Antithrombotics: -DVT/anticoagulation:  Pharmaceutical: Lovenox             -antiplatelet therapy: ASA 3. Chronic LBP/Pain Management: Hydrocodone does not last as long and keeps him up at nights.   --Oxycodone 10 mg has been effective in the past-->5-10 mg at  nights --Lidocaine patches at nights (discomfort due to bed).  -voltaren gel for right shoulder pain 4. Mood: LCSW to follow for evaluation and support.              -antipsychotic agents: N/A 5. Neuropsych: This patient is  capable of making decisions on his own behalf. 6. Skin/Wound Care: Routine pressure relief measures.  7. Fluids/Electrolytes/Nutrition/FTT: encourage po  -I personally reviewed the patient's labs today.    2/9 mild hyponatremia--recheck Monday  -remeron/megace to help with appetite  -on D3 diet due to poorly fitting dentures  -assist with meals 8. PML/Disseminated MAC: Continue Zithromax and Ethambutol.  9. 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 Continue Biktarvy             --on Bactrim for PJP prophylaxis.  10. Pancytopenia: Neutropenia with WBC downward trend form 2.8-->1.5             --H/H down from 10.--->8.5/26.6             --2/9 wbcs' up to 3.5, hgb still trending down.  -continue serial cbc's 11. Insomnia: Encouraged use of Remeron and Seroquel also to help with sleep.  Increase Remeron to 67m.             --will schedule oxycodone 10 mg at nights to help with LBP/sleep 12. Tachycardia: Monitor HR/BP TID- Monitor HR with activity  -100 at rest   14. Abnormal LFTs: Continue to hold Lipitor. Will hold motrin also for now.  --Recheck LFTs stable, sl improved 2/9.   15. COPD: Continue MDI.        LOS: 1 days A FACE TO FACE EVALUATION WAS PERFORMED  ZMeredith Staggers2/02/2022, 8:38 AM

## 2021-07-21 NOTE — Progress Notes (Signed)
Inpatient Rehabilitation Center Individual Statement of Services  Patient Name:  Kurt Foley  Date:  07/21/2021  Welcome to the Inpatient Rehabilitation Center.  Our goal is to provide you with an individualized program based on your diagnosis and situation, designed to meet your specific needs.  With this comprehensive rehabilitation program, you will be expected to participate in at least 3 hours of rehabilitation therapies Monday-Friday, with modified therapy programming on the weekends.  Your rehabilitation program will include the following services:  Physical Therapy (PT), Occupational Therapy (OT), Speech Therapy (ST), 24 hour per day rehabilitation nursing, Therapeutic Recreaction (TR), Neuropsychology, Care Coordinator, Rehabilitation Medicine, Nutrition Services, and Pharmacy Services  Weekly team conferences will be held on Tuesday to discuss your progress.  Your Inpatient Rehabilitation Care Coordinator will talk with you frequently to get your input and to update you on team discussions.  Team conferences with you and your family in attendance may also be held.  Expected length of stay: 8-12 days  Overall anticipated outcome: supervision level  Depending on your progress and recovery, your program may change. Your Inpatient Rehabilitation Care Coordinator will coordinate services and will keep you informed of any changes. Your Inpatient Rehabilitation Care Coordinator's name and contact numbers are listed  below.  The following services may also be recommended but are not provided by the Inpatient Rehabilitation Center:   Home Health Rehabiltiation Services Outpatient Rehabilitation Services    Arrangements will be made to provide these services after discharge if needed.  Arrangements include referral to agencies that provide these services.  Your insurance has been verified to be:  None Your primary doctor is:  None  Pertinent information will be shared with your doctor  and your insurance company.  Inpatient Rehabilitation Care Coordinator:  Dossie Der, [place] [phone] or [phone]  Information discussed with and copy given to patient by: Lucy Chris, 07/21/2021, 1:20 PM

## 2021-07-21 NOTE — Evaluation (Addendum)
Speech Language Pathology Assessment and Plan  Patient Details  Name: Kurt Foley MRN: [id number] Date of Birth: Oct 30, 1975  SLP Diagnosis: Cognitive Impairments;Dysarthria  Rehab Potential: Fair ELOS: 10-14  days    Today's Date: 07/21/2021 SLP Individual Time: 0998-3382 SLP Individual Time Calculation (min): 60 min   Hospital Problem: Principal Problem:   Debility  Past Medical History:  Past Medical History:  Diagnosis Date   Depression    HIV infection ([place])    Past Surgical History: History reviewed. No pertinent surgical history.  Assessment / Plan / Recommendation Clinical Impression   Kurt Foley is a 46 year old RH-male with history of CVA with residual R-HP, COPD, AIDS, disseminated MAC with brain biospy revealing JC virus/PML during recent stay at [facility] 1/11- 1/24 for IRIS in setting of PMA w/ anorexia and balance deficits. He was readmitted to [facility] on 07/13/21 with FTT and difficulty for caring of him at home (due to lack of SNF benefits).  Dr. Gale Journey consulted for input and recommended continuing MAC treatment with ophthalmology consult for visual disturbance as well as question of brain bx/LP for further work up. Patient with inability to recover CD4 count with gap in ART and hope for recovery with resumption of Biktarvy since 02/2021. Eye exam by Dr. Monica Martinez was negative for retinitis, vitritis, uveitis, opportunistic infection or ethambutol  toxicity. Palliative care consulted due to high mortality/poor prognosis with PML diagnosis and mother/aunts elected on full scope of treatment with limited code--limited code and rehab for ongoing weakness.    LP was negative organism and no fungal growth so far. Low grade fevers worked up with BC/UC which was negative. Abdominal ultrasound repeated due to abnormal LFTs and statin held as ultrasound without acute findings. Megace added to help with intake and electrolyte abnormalities have resolved. Ensure added for nutritional  supplement. He continues to be limited by weakness with unsteady gait and ataxia. CIR recommended due to recent functional decline. Currently complains of right sided shoulder pain.   Pt was seen in-room for cognitive-linguistic evaluation this AM. Pt was pleasant and demonstrated willingness to participate in evaluation and subsequent therapy if warranted. Prior to this CVA, pt was living with aunt who was managing all iADLs. Pt was unable to report why he was unable to complete other tasks. From chart review, it appears pt "sleeps all day", as well as watches TV.  Pt was unable to describe work history, but did report that he does not currently work. SLP evaluated pt using [facility] Mental Status (SLUMS) and other informal measures. Pt scored a 13/30 on the SLUMS ( >27 WNL) indicating cognitive impairment. Pt demonstrated errors in problem solving, organization, memory, cognitive flexibility, and executive functioning. Pt reported, "I feel stupid because I am thinking so slow". Also sharing that he would like to get back to "the way he was before". SLP sat on right side of pt during evaluation, pt only made eye contact upon request. Pt was able to complete some simple problem solving, but frequently required SLP to repeat herself. This was successful at increase accuracy during questions. SLP completed clinical swallow eval (CSE) to determine safety of current diet. Pt did not exhibit any overt s/sx of aspiration with any consistency. Pt did endorse speech change. Pt demonstrated mild-to-moderate dysarthria  during conversation; however, pt remained ~75% intelligible. SLP provided edu to pt regarding dysarthria and how speech intelligibility can fluctuate with time of day/fatigue. Due to PLOF, SLP rec skilled speech services  to address memory, attention, problem solving and awareness to increase safety at home, as well as, dysarthria strategies. SLP rec further assessment of financial and  medication management (if was completing prior to this hospital stay).    Skilled Therapeutic Interventions          SLP completed cognitive-linguistic and clinical swallow examination (CSE). See above for details.   SLP Assessment  Patient will need skilled Speech Lanaguage Pathology Services during CIR admission    Recommendations  SLP Diet Recommendations: Thin Liquid Administration via: Straw Medication Administration: Whole meds with liquid Oral Care Recommendations: Oral care BID Patient destination: Home Follow up Recommendations: Outpatient SLP;Home Health SLP Equipment Recommended: None recommended by SLP    SLP Frequency 3 to 5 out of 7 days   SLP Duration  SLP Intensity  SLP Treatment/Interventions 10-14  days  Minumum of 1-2 x/day, 30 to 90 minutes  Cognitive remediation/compensation;Internal/external aids;Environmental controls;Speech/Language facilitation;Therapeutic Activities;Cueing hierarchy;Functional tasks;Patient/family education;Therapeutic Exercise    Pain Pain Assessment Pain Score: 0-No pain  Prior Functioning Cognitive/Linguistic Baseline: Information not available Type of Home: House  Lives With: Family Available Help at Discharge: Family;Available PRN/intermittently Vocation: Unemployed  SLP Evaluation Cognition Overall Cognitive Status: No family/caregiver present to determine baseline cognitive functioning Arousal/Alertness: Awake/alert Orientation Level: Oriented to person;Disoriented to place;Disoriented to time Year: 2023 Month: February Day of Week: Incorrect Attention: Sustained;Selective;Alternating Sustained Attention: Appears intact Selective Attention: Impaired Selective Attention Impairment: Verbal basic Alternating Attention: Impaired Alternating Attention Impairment: Verbal basic Memory: Impaired Memory Impairment: Retrieval deficit;Decreased short term memory;Storage deficit Decreased Short Term Memory: Verbal  complex;Verbal basic Immediate Memory Recall: Sock;Blue;Bed Memory Recall Sock: Without Cue Memory Recall Blue: Without Cue Memory Recall Bed: With Cue Awareness: Appears intact Problem Solving: Appears intact Executive Function: Organizing Organizing: Impaired Organizing Impairment: Verbal basic Safety/Judgment: Impaired Comments: Unable to assess baseline cognitive function. [employer] Scales of Cognitive Functioning: Confused/appropriate  Comprehension Auditory Comprehension Overall Auditory Comprehension: Appears within functional limits for tasks assessed Interfering Components: Attention;Processing speed;Working Field seismologist: Forensic psychologist Expression Overall Verbal Expression: Appears within functional limits for tasks assessed Written Expression Dominant Hand: Right Oral Motor Oral Motor/Sensory Function Overall Oral Motor/Sensory Function: Mild impairment Facial ROM: Reduced right Facial Symmetry: Abnormal symmetry right Facial Sensation: Reduced right Lingual ROM: Within Functional Limits Lingual Symmetry: Within Functional Limits Lingual Strength: Within Functional Limits Lingual Sensation: Within Functional Limits Velum: Within Functional Limits Motor Speech Overall Motor Speech: Impaired Respiration: Within functional limits Phonation: Normal Resonance: Within functional limits Articulation: Impaired Level of Impairment: Conversation Intelligibility: Intelligibility reduced Conversation: 75-100% accurate Motor Planning: Witnin functional limits Effective Techniques: Over-articulate;Increased vocal intensity  Care Tool Care Tool Cognition Ability to hear (with hearing aid or hearing appliances if normally used Ability to hear (with hearing aid or hearing appliances if normally used): 0. Adequate - no difficulty in normal conservation, social interaction, listening to TV   Expression of Ideas and  Wants Expression of Ideas and Wants: 3. Some difficulty - exhibits some difficulty with expressing needs and ideas (e.g, some words or finishing thoughts) or speech is not clear   Understanding Verbal and Non-Verbal Content Understanding Verbal and Non-Verbal Content: 3. Usually understands - understands most conversations, but misses some part/intent of message. Requires cues at times to understand  Memory/Recall Ability Memory/Recall Ability : Current season;Location of own room;That he or she is in a hospital/hospital unit    Bedside Swallowing Assessment General    Oral Care Assessment   Ice Chips Ice chips:  Not tested Thin Liquid Thin Liquid: Within functional limits Presentation: Straw;Cup Nectar Thick Nectar Thick Liquid: Not tested Honey Thick Honey Thick Liquid: Not tested Puree Puree: Not tested Solid Solid: Within functional limits Presentation: Self Fed BSE Assessment Risk for Aspiration Impact on safety and function: Mild aspiration risk Other Related Risk Factors: Cognitive impairment  Short Term Goals: Week 1: SLP Short Term Goal 1 (Week 1): Patient will recall 2-3 speech intelligibility strategies to increase intelligibility when fatigued with minA verbal cues. SLP Short Term Goal 2 (Week 1): Patient will recall and demonstrate use of memory compensations to increase independence and safety at home with minA verbal cues. SLP Short Term Goal 3 (Week 1): Pt will participate in functional alternating attention activities with min A verbal cues SLP Short Term Goal 4 (Week 1): Pt will solve basic, home safety problems with minA verbal cues.  Refer to Care Plan for Long Term Goals  Recommendations for other services: None   Discharge Criteria: Patient will be discharged from SLP if patient refuses treatment 3 consecutive times without medical reason, if treatment goals not met, if there is a change in medical status, if patient makes no progress towards goals or if  patient is discharged from hospital.  The above assessment, treatment plan, treatment alternatives and goals were discussed and mutually agreed upon: by patient  Verdene Lennert MS, CCC-SLP, CBIS  07/21/2021, 4:48 PM

## 2021-07-21 NOTE — Evaluation (Signed)
Occupational Therapy Assessment and Plan  Patient Details  Name: Kurt Foley MRN: [id number] Date of Birth: 1975/11/29  OT Diagnosis: ataxia, hemiplegia affecting dominant side, and muscle weakness (generalized) Rehab Potential: Rehab Potential (ACUTE ONLY): Good ELOS: 6-12 days   Today's Date: 07/21/2021 OT Individual Time: 4665-9935 OT Individual Time Calculation (min): 60 min     Hospital Problem: Principal Problem:   Debility   Past Medical History:  Past Medical History:  Diagnosis Date   Depression    HIV infection ([place])    Past Surgical History: History reviewed. No pertinent surgical history.  Assessment & Plan Clinical Impression: Patient is a 46 y.o. 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 He was readmitted to [facility] on 07/13/21 with FTT and difficulty for caring of him at home (due to lack of SNF benefits).  Dr. Gale Journey consulted for input and recommended continuing MAC treatment with ophthalmology consult for visual disturbance as well as question of brain bx/LP for further work up. Patient with inability to recover CD4 count with gap in ART and hope for recovery with resumption of Biktarvy since 02/2021. Eye exam by Dr. Monica Martinez was negative for retinitis, vitritis, uveitis, opportunistic infection or ethambutol  toxicity. Palliative care consulted due to high mortality/poor prognosis with PML diagnosis and mother/aunts elected on full scope of treatment with limited code--limited code and rehab for ongoing weakness.    LP was negative organism and no fungal growth so far. Low grade fevers worked up with BC/UC which was negative. Abdominal ultrasound repeated due to abnormal LFTs and statin held as ultrasound without acute findings. Megace added to help with intake and electrolyte abnormalities have  resolved. Ensure added for nutritional supplement. He continues to be limited by weakness with unsteady gait and ataxia. CIR recommended due to recent functional decline. Currently complains of right sided shoulder pain. Patient transferred to CIR on 07/20/2021 .    Patient currently requires min with basic self-care skills secondary to muscle weakness and muscle paralysis and impaired timing and sequencing, abnormal tone, unbalanced muscle activation, ataxia, decreased coordination, and decreased motor planning.  Prior to hospitalization, patient could complete ADLs/IADLs with independence.  Patient will benefit from skilled intervention to decrease level of assist with basic self-care skills and increase independence with basic self-care skills prior to discharge home with care partner.  Anticipate patient will require intermittent supervision and follow up outpatient.  OT - End of Session Activity Tolerance: Endurance does not limit participation in activity Endurance Deficit: No OT Assessment Rehab Potential (ACUTE ONLY): Good OT Barriers to Discharge: Inaccessible home environment;Decreased caregiver support OT Patient demonstrates impairments in the following area(s): Balance;Endurance;Motor;Safety;Sensory OT Basic ADL's Functional Problem(s): Grooming;Bathing;Eating;Dressing;Toileting OT Transfers Functional Problem(s): Toilet;Tub/Shower OT Additional Impairment(s): Fuctional Use of Upper Extremity OT Plan OT Intensity: Minimum of 1-2 x/day, 45 to 90 minutes OT Frequency: 5 out of 7 days OT Duration/Estimated Length of Stay: 6-12 days OT Treatment/Interventions: Balance/vestibular training;Community reintegration;Discharge planning;DME/adaptive equipment instruction;Functional electrical stimulation;Functional mobility training;Neuromuscular re-education;Patient/family education;Self Care/advanced ADL retraining;Splinting/orthotics;Therapeutic Activities;Therapeutic Exercise;UE/LE Strength  taining/ROM;UE/LE Coordination activities OT Self Feeding Anticipated Outcome(s): I OT Basic Self-Care Anticipated Outcome(s): Supervision A OT Toileting Anticipated Outcome(s): Supervision A OT Bathroom Transfers Anticipated Outcome(s): Supervision A OT Recommendation Patient destination: Home Follow Up Recommendations: Outpatient OT Equipment Recommended: To be determined   OT Evaluation Precautions/Restrictions  Precautions Precautions: Fall Precaution Comments: R side hemi Required Braces or Orthoses: Splint/Cast Splint/Cast: aircast on R ankle, R resting hand splint (resting hand splint not in room at time of PT eval on 2/9) Restrictions Weight Bearing Restrictions: No General Chart Reviewed: Yes Additional Pertinent History: CVA with residual R hemiparesis, COPD, AIDS and disseminated MAC Family/Caregiver Present: No Vital Signs  Pain Pain Assessment Pain Scale: 0-10 Pain Score: 0-No pain Home Living/Prior Functioning Home Living Family/patient expects to be discharged to:: Private residence Living Arrangements: Other relatives Available Help at Discharge: Family, Available PRN/intermittently (Aunt) Type of Home: House Home Access: Stairs to enter Technical brewer of Steps: 6 Entrance Stairs-Rails: Right (Taken from OT eval) Home Layout: Multi-level, Full bath on main level, Able to live on main level with bedroom/bathroom Alternate Level Stairs-Number of Steps: 15 Alternate Level Stairs-Rails:  (Pt cannot recall) Bathroom Shower/Tub: Tub/shower unit Bathroom Toilet: Standard Bathroom Accessibility: No Additional Comments: Aunt works during the day 4am-5pm. Pt states he has not been very mobile lately, uses RW at baseline. unclear of baseline cognitive status, no family present to determine.  Lives With: Family Engineer, petroleum) IADL History Homemaking Responsibilities: No Current License: No Leisure and Hobbies: watch TV, "Im a homebody" Prior Function Level of  Independence: Requires assistive device for independence, Needs assistance with ADLs (Per pt, was using SPC prior to CVA in January. After CVA, was not ambulating much and used RW)  Able to Take Stairs?: Yes Driving: Yes Vision Baseline Vision/History: 0 No visual deficits Ability to See in Adequate Light: 0 Adequate Patient Visual Report: No change from baseline Vision Assessment?: No apparent visual deficits Perception  Perception: Within Functional Limits Praxis Praxis: Impaired Praxis Impairment Details: Motor planning Cognition Overall Cognitive Status: No family/caregiver present to determine baseline cognitive functioning Arousal/Alertness: Lethargic Orientation Level: Person;Place;Situation Person: Oriented Place: Oriented Situation: Oriented Year: 2023 Month: February Day of Week: Correct Memory: Impaired Memory Impairment: Decreased short term memory Immediate Memory Recall: Sock;Blue;Bed Memory Recall Sock: Without Cue Memory Recall Blue: Without Cue Memory Recall Bed: With Cue Attention: Sustained Sustained Attention: Appears intact Awareness: Appears intact Problem Solving: Appears intact Safety/Judgment: Impaired Comments: Unable to assess baseline cognitive function. Sensation Sensation Light Touch: Impaired Detail Light Touch Impaired Details: Impaired RUE;Impaired RLE Proprioception: Impaired by gross assessment Coordination Gross Motor Movements are Fluid and Coordinated: No Fine Motor Movements are Fluid and Coordinated: No Coordination and Movement Description: Hx of CVA with R residual weakness Finger Nose Finger Test: Unable on R. WFL on L. Heel Shin Test: [facility] Motor  Motor Motor: Hemiplegia;Abnormal tone Motor - Skilled Clinical Observations: Tone present in RUE.  Trunk/Postural Assessment  Cervical Assessment Cervical Assessment: Exceptions to [facility] Thoracic Assessment Thoracic Assessment: Exceptions to [facility] Lumbar Assessment Lumbar  Assessment: Exceptions to [facility] Postural Control Postural Control: Deficits on evaluation Righting Reactions: Delayed  Balance Balance Balance Assessed: Yes Static Sitting Balance Static Sitting - Balance Support: No upper extremity supported Static Sitting - Level of Assistance: 5: Stand by assistance (supervision) Dynamic Sitting Balance Dynamic Sitting - Balance Support: Left upper extremity supported Dynamic Sitting - Level of Assistance: 5: Stand by assistance (Min gaurd) Theatre stage manager Standing - Balance Support: Left upper extremity supported Static Standing - Level of Assistance: 5: Stand by assistance (Min guard) Dynamic Standing Balance Dynamic Standing - Balance Support: Left upper extremity supported Dynamic Standing - Level of Assistance: 4: Min assist Extremity/Trunk Assessment RUE Assessment RUE Assessment: Exceptions to [facility] Passive Range of Motion (PROM) Comments: Limited by  tone in RUE Active Range of Motion (AROM) Comments: R hemi RUE Body System: Neuro Brunstrum levels for arm and hand: Arm;Hand Brunstrum level for arm: Stage II Synergy is developing Brunstrum level for hand: Stage I Flaccidity LUE Assessment LUE Assessment: Within Functional Limits  Care Tool Care Tool Self Care Eating   Eating Assist Level: Set up assist    Oral Care  Oral care, brush teeth, clean dentures activity did not occur: Refused      Bathing Bathing activity did not occur: Refused            Upper Body Dressing(including orthotics) Upper body dressing/undressing activity did not occur (including orthotics): Refused          Lower Body Dressing (excluding footwear)   What is the patient wearing?: Pants;Underwear/pull up Assist for lower body dressing: Minimal Assistance - Patient > 75%    Putting on/Taking off footwear   What is the patient wearing?: Shoes;Socks Assist for footwear: Minimal Assistance - Patient > 75%       Care Tool  Toileting Toileting activity   Assist for toileting: Minimal Assistance - Patient > 75%     Care Tool Bed Mobility Roll left and right activity   Roll left and right assist level: Independent with assistive device    Sit to lying activity   Sit to lying assist level: Independent with assistive device    Lying to sitting on side of bed activity         Care Tool Transfers Sit to stand transfer   Sit to stand assist level: Minimal Assistance - Patient > 75%    Chair/bed transfer   Chair/bed transfer assist level: Minimal Assistance - Patient > 75%     Toilet transfer   Assist Level: Minimal Assistance - Patient > 75%     Care Tool Cognition  Expression of Ideas and Wants Expression of Ideas and Wants: 3. Some difficulty - exhibits some difficulty with expressing needs and ideas (e.g, some words or finishing thoughts) or speech is not clear  Understanding Verbal and Non-Verbal Content Understanding Verbal and Non-Verbal Content: 4. Understands (complex and basic) - clear comprehension without cues or repetitions   Memory/Recall Ability Memory/Recall Ability : Current season;Location of own room;That he or she is in a hospital/hospital unit   Refer to Care Plan for [place] 1 OT Short Term Goal 1 (Week 1): STG=LTG 2/2 ELOS  Recommendations for other services: Other: TBD    Skilled Therapeutic Intervention Patient met lying supine in bed in agreement with OT evaluation/treatment. 0/10 pain reported at rest and with activity. Education provided on purpose of rehab, role of OT, goals and ELOS. Patient expressed verbal understanding but would benefit from continued education. Patient declined bathing this afternoon reporting completing am. ADLs prior to evaluation. Session with focus on mobility with and without use of hemi-walker. Patient also completed toilet transfer and LB dressing to don footwear. Please refer below for additional details. Patient  limited by deficits including R hemi, decreased balance and difficulty ambulating and would benefit from continue OT services to maximize safety/independence with self-care tasks in prep for safe d/c home. Session concluded with patient seated in recliner with call bell within reach, chair alarm activated and all needs met.   ADL ADL Eating: Not assessed Grooming: Not assessed Upper Body Bathing: Not assessed Lower Body Bathing: Not assessed Upper Body Dressing: Not assessed Lower Body Dressing: Minimal assistance Where Assessed-Lower Body Dressing:  Edge of bed Toileting: Minimal assistance Where Assessed-Toileting: Glass blower/designer: Psychiatric nurse Method: Human resources officer: Not assessed ADL Comments: Patient declined bathing at time of inital eval. Mobility  Bed Mobility Bed Mobility: Rolling Left;Supine to Sit Rolling Right: Independent with assistive device Rolling Left: Independent with assistive device Supine to Sit: Independent with assistive device Transfers Sit to Stand: Minimal Assistance - Patient > 75% Stand to Sit: Minimal Assistance - Patient > 75%   Discharge Criteria: Patient will be discharged from OT if patient refuses treatment 3 consecutive times without medical reason, if treatment goals not met, if there is a change in medical status, if patient makes no progress towards goals or if patient is discharged from hospital.  The above assessment, treatment plan, treatment alternatives and goals were discussed and mutually agreed upon: by patient  Jimma Ortman R Howerton-Davis 07/21/2021, 2:32 PM

## 2021-07-21 NOTE — Progress Notes (Signed)
Inpatient Rehabilitation Care Coordinator Assessment and Plan Patient Details  Name: Kurt Foley MRN: [id number] Date of Birth: 11/26/1975  Today's Date: 07/21/2021  Hospital Problems: Principal Problem:   Debility  Past Medical History:  Past Medical History:  Diagnosis Date   Depression    HIV infection ([facility])    Past Surgical History: History reviewed. No pertinent surgical history. Social History:  reports that he has quit smoking. His smoking use included cigarettes. He has a 12.50 pack-year smoking history. He has never used smokeless tobacco. He reports that he does not currently use alcohol after a past usage of about 2.0 standard drinks per week. He reports that he does not currently use drugs after having used the following drugs: Marijuana and Cocaine.  Family / Support Systems Marital Status: Single Patient Roles: Other (Comment) (Aunt and son) Other Supports: Carolyn-Mom [phone]  [phone]   Tracy-Aunt [phone] Anticipated Caregiver: French Ana Ability/Limitations of Caregiver: Works 4am-5 pm Times pt will be alone Medical laboratory scientific officer: Evenings only Family Dynamics: Close with aunt and Mom, mom has RA and not able to assist can check on him but that is it. Aunt works and will be gone during the day. Pt will be alone during this time  Social History Preferred language: English Religion: Holiness/Pentecostal Cultural Background: No issues Education: HS Health Literacy - How often do you need to have someone help you when you read instructions, pamphlets, or other written material from your doctor or pharmacy?: Often Writes: Yes Employment Status: Unemployed Date Retired/Disabled/Unemployed: Applying for SSI-has Retta Mac working on this-according to pt Legal History/Current Legal Issues: Has [place] Su Hilt working on his SSI. Pt not sure where it stands at this time Guardian/Conservator: None-according to MD pt is capable of making his own  decisions while here   Abuse/Neglect Abuse/Neglect Assessment Can Be Completed: Yes Physical Abuse: Denies Verbal Abuse: Denies Sexual Abuse: Denies Exploitation of patient/patient's resources: Denies Self-Neglect: Denies  Patient response to: Social Isolation - How often do you feel lonely or isolated from those around you?: Never  Emotional Status Pt's affect, behavior and adjustment status: Pt is wanting to get independent again like he was beofre his stroke. His facial droop bothers him and his dentures do not fit now. He will do what is asked of him but wants to get mod/i before he leaves here Recent Psychosocial Issues: CVA in 05/2021 and infections-was recently discharged from [facility] after biopsy Psychiatric History: History of depression used to take antidepression meds is not currently, asked if would like to see neuro-psych while here he would. Will place on list to be seen Substance Abuse History: History of drugs, tobacco and marijuana reports no longer does all of these  Patient / Family Perceptions, Expectations & Goals Pt/Family understanding of illness & functional limitations: Pt is able to explain his issues and is hopeful he will do well here and recover and wants to focus on the positive. He hopes the meds he has been taking make his counts go down. He talks to the MD daily when rounding. Premorbid pt/family roles/activities: Son, nephew, friend, etc Anticipated changes in roles/activities/participation: resume Pt/family expectations/goals: Pt states: " I want to be able to take care of myself before I leave here."  [employer]: Other (Comment) (Followed by [facility]) Premorbid Home Care/DME Agencies: Other (Comment) (has rw and cane) Transportation available at discharge: Family transports Is the patient able to respond to transportation needs?: Yes In the past 12 months, has lack of transportation  kept you from medical appointments  or from getting medications?: No In the past 12 months, has lack of transportation kept you from meetings, work, or from getting things needed for daily living?: No Resource referrals recommended: Neuropsychology  Discharge Planning Living Arrangements: Other relatives Support Systems: Other relatives, Parent, Friends/neighbors Type of Residence: Private residence Insurance Resources: Customer service manager Resources: Family Support Financial Screen Referred: Previously completed Living Expenses: Lives with family Money Management: Family Does the patient have any problems obtaining your medications?: Yes (Describe) (uninsured) Home Management: Aunt Patient/Family Preliminary Plans: Return to Aunt;s home but will need to be mod/i due to will be alone while she works. He came back into the hospital due to he was too much care for her and Mom prior to admission after being [facility] from [facility] Coordinator Barriers to Discharge: Insurance for SNF coverage, Decreased caregiver support, Medication compliance Care Coordinator Anticipated Follow Up Needs: HH/OP  Clinical Impression Pleasant gentle,an who is motivated to improve and make progress while here. His aunt and mom are supportive and involved. Have reached out financial services regarding his SSI and medicaid. Await team's evaluations. Placed on neuro-psych list  Lucy Chris 07/21/2021, 1:16 PM

## 2021-07-21 NOTE — Evaluation (Signed)
Physical Therapy Assessment and Plan  Patient Details  Name: Kurt Foley MRN: [id number] Date of Birth: June 20, 1975  PT Diagnosis: Abnormality of gait, Cognitive deficits, Coordination disorder, Difficulty walking, Hemiparesis dominant, Impaired cognition, Impaired sensation, Low back pain, and Muscle weakness Rehab Potential: Good ELOS: 8-12 days   Today's Date: 07/21/2021 PT Individual Time: 5681-2751 PT Individual Time Calculation (min): 75 min    Hospital Problem: Principal Problem:   Debility   Past Medical History:  Past Medical History:  Diagnosis Date   Depression    HIV infection ([place])    Past Surgical History: History reviewed. No pertinent surgical history.  Assessment & Plan Clinical Impression: Patient is a 46 y.o. 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 He was readmitted to [facility] on 07/13/21 with FTT and difficulty for caring of him at home (due to lack of SNF benefits).  Dr. Gale Journey consulted for input and recommended continuing MAC treatment with ophthalmology consult for visual disturbance as well as question of brain bx/LP for further work up. Patient with inability to recover CD4 count with gap in ART and hope for recovery with resumption of Biktarvy since 02/2021. Eye exam by Dr. Monica Martinez was negative for retinitis, vitritis, uveitis, opportunistic infection or ethambutol  toxicity. Palliative care consulted due to high mortality/poor prognosis with PML diagnosis and mother/aunts elected on full scope of treatment with limited code--limited code and rehab for ongoing weakness.    LP was negative organism and no fungal growth so far. Low grade fevers worked up with BC/UC which was negative. Abdominal ultrasound repeated due to abnormal LFTs and statin held as ultrasound without acute findings.  Megace added to help with intake and electrolyte abnormalities have resolved. Ensure added for nutritional supplement. He continues to be limited by weakness with unsteady gait and ataxia. CIR recommended due to recent functional decline. Currently complains of right sided shoulder pain.   Patient currently requires min with mobility secondary to muscle weakness, unbalanced muscle activation, decreased coordination, and decreased motor planning, decreased safety awareness, decreased memory, and delayed processing, and decreased standing balance, decreased postural control, and hemiplegia.  Prior to hospitalization, patient was min with mobility and lived with Family Engineer, petroleum) in a House home.  Home access is 6Stairs to enter.  Patient will benefit from skilled PT intervention to maximize safe functional mobility, minimize fall risk, and decrease caregiver burden for planned discharge home with intermittent assist.  Anticipate patient will benefit from follow up [facility] at discharge.  PT - End of Session Activity Tolerance: Tolerates 30+ min activity without fatigue Endurance Deficit: No PT Assessment Rehab Potential (ACUTE/IP ONLY): Good PT Barriers to Discharge: Decreased caregiver support;Home environment access/layout;Lack of/limited family support PT Patient demonstrates impairments in the following area(s): Balance;Endurance;Motor;Nutrition;Pain;Perception;Safety;Sensory PT Transfers Functional Problem(s): Bed Mobility;Bed to Chair;Car;Furniture PT Locomotion Functional Problem(s): Ambulation;Wheelchair Mobility;Stairs PT Plan PT Intensity: Minimum of 1-2 x/day ,45 to 90 minutes PT Frequency: 5 out of 7 days PT Duration Estimated Length of Stay: 8-12 days PT Treatment/Interventions: Ambulation/gait training;Community reintegration;DME/adaptive equipment instruction;Neuromuscular re-education;Psychosocial support;Stair training;UE/LE Strength taining/ROM;Wheelchair propulsion/positioning;UE/LE  Coordination activities;Skin care/wound management;Therapeutic Activities;Pain management;Functional electrical stimulation;Discharge planning;Balance/vestibular training;Cognitive remediation/compensation;Disease management/prevention;Functional mobility training;Patient/family education;Splinting/orthotics;Therapeutic Exercise;Visual/perceptual remediation/compensation PT Transfers Anticipated Outcome(s): Mod I PT Locomotion Anticipated Outcome(s): S* PT Recommendation Recommendations for Other Services: Therapeutic Recreation consult Therapeutic Recreation Interventions: Pet therapy;Kitchen group;Stress management;Outing/community  reintergration Follow Up Recommendations: Home health PT Patient destination: Home Equipment Recommended: To be determined   PT Evaluation Precautions/Restrictions Precautions Precautions: Fall Precaution Comments: R side hemi Required Braces or Orthoses: Splint/Cast Splint/Cast: aircast on R ankle, R resting hand splint (resting hand splint not in room at time of PT eval on 2/9) Restrictions Weight Bearing Restrictions: No Pain Interference Pain Interference Pain Effect on Sleep: 2. Occasionally Pain Interference with Therapy Activities: 2. Occasionally Pain Interference with Day-to-Day Activities: 2. Occasionally Home Living/Prior Functioning Home Living Living Arrangements: Other relatives Available Help at Discharge: Family;Available PRN/intermittently (Aunt) Type of Home: House Home Access: Stairs to enter [employer] of Steps: 6 Entrance Stairs-Rails: Right (Taken from OT eval) Home Layout: Multi-level;Full bath on main level;Able to live on main level with bedroom/bathroom Alternate Level Stairs-Number of Steps: 15 Alternate Level Stairs-Rails:  (Pt cannot recall) Bathroom Shower/Tub: Tub/shower unit Bathroom Toilet: Standard Bathroom Accessibility: No Additional Comments: Aunt works during the day 4am-5pm. Pt states he has not  been very mobile lately, uses RW at baseline. unclear of baseline cognitive status, no family present to determine.  Lives With: Family Engineer, petroleum) Prior Function Level of Independence: Requires assistive device for independence;Needs assistance with ADLs (Per pt, was using SPC prior to CVA in January. After CVA, was not ambulating much and used RW)  Able to Take Stairs?: Yes Driving: Yes Vision/Perception  Vision - History Ability to See in Adequate Light: 0 Adequate Perception Perception: Within Functional Limits Praxis Praxis: Impaired Praxis Impairment Details: Motor planning  Cognition Overall Cognitive Status: No family/caregiver present to determine baseline cognitive functioning Arousal/Alertness: Lethargic Orientation Level: Oriented to place;Oriented to situation;Oriented to person;Disoriented to time Year: 2023 Month: February Day of Week: Correct Attention: Sustained Sustained Attention: Appears intact Memory: Impaired Memory Impairment: Decreased short term memory Immediate Memory Recall: Sock;Blue;Bed Memory Recall Sock: Without Cue Memory Recall Blue: Without Cue Memory Recall Bed: With Cue Awareness: Appears intact Problem Solving: Appears intact Safety/Judgment: Impaired Comments: Unable to assess baseline cognitive function. Sensation Sensation Light Touch: Impaired Detail Light Touch Impaired Details: Impaired RUE;Impaired RLE Proprioception: Impaired by gross assessment Coordination Gross Motor Movements are Fluid and Coordinated: No Fine Motor Movements are Fluid and Coordinated: No Coordination and Movement Description: Hx of CVA with R residual weakness Finger Nose Finger Test: Unable on R. WFL on L. Heel Shin Test: [facility] Motor  Motor Motor: Hemiplegia;Abnormal tone Motor - Skilled Clinical Observations: Tone present in RUE/RLE   Trunk/Postural Assessment  Cervical Assessment Cervical Assessment: Exceptions to [facility] (Forward head) Thoracic  Assessment Thoracic Assessment: Exceptions to [facility] (Rounded shoulders) Lumbar Assessment Lumbar Assessment: Exceptions to [facility] (Posterior pelvic tilt) Postural Control Postural Control: Deficits on evaluation Righting Reactions: Delayed  Balance Balance Balance Assessed: Yes Static Sitting Balance Static Sitting - Balance Support: No upper extremity supported;Feet supported Static Sitting - Level of Assistance: 5: Stand by assistance Dynamic Sitting Balance Dynamic Sitting - Balance Support: Feet supported;Left upper extremity supported Dynamic Sitting - Level of Assistance: 5: Stand by assistance Static Standing Balance Static Standing - Balance Support: During functional activity;Left upper extremity supported Static Standing - Level of Assistance: 5: Stand by assistance Dynamic Standing Balance Dynamic Standing - Balance Support: Left upper extremity supported;During functional activity Dynamic Standing - Level of Assistance: 4: Min assist Extremity Assessment  RLE Assessment RLE Assessment: Exceptions to [facility] RLE Strength RLE Overall Strength: Deficits;Due to premorbid status RLE Overall Strength Comments: Grossly 3+-4-/5 Right Hip Flexion: 3+/5 Right Hip ABduction: 4-/5 Right Hip ADduction: 4-/5 Right  Knee Flexion: 4-/5 Right Knee Extension: 4-/5 Right Ankle Dorsiflexion: 1/5 Right Ankle Plantar Flexion: 1/5 LLE Assessment LLE Assessment: Within Functional Limits LLE Strength LLE Overall Strength: Within Functional Limits for tasks assessed LLE Overall Strength Comments: Grossly 4+/5 Left Hip Flexion: 4+/5 Left Hip ABduction: 4+/5 Left Hip ADduction: 4+/5 Left Knee Flexion: 5/5 Left Knee Extension: 5/5 Left Ankle Dorsiflexion: 5/5 Left Ankle Plantar Flexion: 4+/5  Care Tool Care Tool Bed Mobility Roll left and right activity   Roll left and right assist level: Independent with assistive device Roll left and right assistive device comment: Bedrail  Sit to lying  activity   Sit to lying assist level: Independent with assistive device Sit to lying assistive device comment: Bedrail  Lying to sitting on side of bed activity   Lying to sitting on side of bed assist level: the ability to move from lying on the back to sitting on the side of the bed with no back support.: Moderate Assistance - Patient 50 - 74%     Care Tool Transfers Sit to stand transfer   Sit to stand assist level: Minimal Assistance - Patient > 75%    Chair/bed transfer   Chair/bed transfer assist level: Minimal Assistance - Patient > 75%     Toilet transfer   Assist Level: Minimal Assistance - Patient > 75%    Car transfer   Car transfer assist level: Minimal Assistance - Patient > 75%      Care Tool Locomotion Ambulation   Assist level: Minimal Assistance - Patient > 75% Assistive device: Walker-hemi Max distance: 150  Walk 10 feet activity   Assist level: Minimal Assistance - Patient > 75% Assistive device: Walker-hemi   Walk 50 feet with 2 turns activity   Assist level: Minimal Assistance - Patient > 75% Assistive device: Walker-hemi  Walk 150 feet activity   Assist level: Minimal Assistance - Patient > 75% Assistive device: Walker-hemi  Walk 10 feet on uneven surfaces activity Walk 10 feet on uneven surfaces activity did not occur: Safety/medical concerns (R hemi, fatigue)      Stairs   Assist level: Minimal Assistance - Patient > 75% Stairs assistive device: 1 hand rail Max number of stairs: 12  Walk up/down 1 step activity   Walk up/down 1 step (curb) assist level: Minimal Assistance - Patient > 75% Walk up/down 1 step or curb assistive device: 1 hand rail  Walk up/down 4 steps activity   Walk up/down 4 steps assist level: Minimal Assistance - Patient > 75% Walk up/down 4 steps assistive device: 1 hand rail  Walk up/down 12 steps activity   Walk up/down 12 steps assist level: Minimal Assistance - Patient > 75% Walk up/down 12 steps assistive device: 1  hand rail  Pick up small objects from floor   Pick up small object from the floor assist level: Dependent - Patient 0%    Wheelchair Is the patient using a wheelchair?: Yes Type of Wheelchair: Manual   Wheelchair assist level: Set up assist Max wheelchair distance: 150  Wheel 50 feet with 2 turns activity   Assist Level: Supervision/Verbal cueing  Wheel 150 feet activity   Assist Level: Supervision/Verbal cueing    Refer to Care Plan for Long Term Goals  SHORT TERM GOAL WEEK 1 PT Short Term Goal 1 (Week 1): STG = LTG due to ELOS  Recommendations for other services: Therapeutic Recreation  Pet therapy, Kitchen group, Stress management, and Outing/community reintegration  Skilled Therapeutic Intervention Evaluation completed (see details  above and below) with education on PT POC, CIR safety policies, 3 hour therapy requirement and goals. Pt received seated in recliner in room, reported pain in low back but did not provide rating and stated "he was not going to complain about it". Offered repositioning and distraction throughout session for pain modulation. Donned resting hand splint to RUE w/max A and pt already wearing aircast on RLE. Pt performed sit <>stands throughout session w/min A and hemi-walker for steadying assist and ambulated >150' from room to ortho gym w/hemi-walker and min A. Noted significant L trunk lean, step-to ataxic pattern, slow cadence, slight circumduction of RLE and downward gaze. Min verbal and tactile cues provided for midline orientation and forward gaze. Pt performed car transfer w/min A for proper hand placement and assistance w/sequencing. Pt then ambulated >100' to main gym w/hemi-walker and min A and ascended/descended 12 6" steps w/L handrail and min A for RLE management. Pt able to demonstrate and teach back proper technique and demonstrated step-to gait pattern, needed min A to properly place RLE on step while descending. Pt then self-propelled in manual WC  using L hemi-technique w/S* >150', noted good hand placement on rim and navigation of chair without need for verbal cues. Pt ambulated >150' back to room w/hemi-walker and min A and was left seated in recliner in room, all needs in reach. Safety plan updated.   Mobility Bed Mobility Bed Mobility: Rolling Left;Supine to Sit Rolling Right: Independent with assistive device Rolling Left: Independent with assistive device Supine to Sit: Independent with assistive device Transfers Transfers: Sit to Stand;Stand to Sit;Stand Pivot Transfers Sit to Stand: Minimal Assistance - Patient > 75% Stand to Sit: Minimal Assistance - Patient > 75% Stand Pivot Transfers: Minimal Assistance - Patient > 75% Stand Pivot Transfer Details: Verbal cues for sequencing;Tactile cues for placement Stand Pivot Transfer Details (indicate cue type and reason): Initial manual facilittion for placement of RLE but greatly improved with increased mobility. Transfer (Assistive device): Hemi-walker Locomotion  Gait Ambulation: Yes Gait Assistance: Minimal Assistance - Patient > 75% Gait Distance (Feet): 150 Feet Assistive device: Hemi-walker Gait Assistance Details: Verbal cues for sequencing;Verbal cues for technique Gait Gait: Yes Gait Pattern: Impaired Gait Pattern: Ataxic;Step-to pattern;Decreased stride length;Right circumduction;Lateral trunk lean to left Gait velocity: decr Stairs / Additional Locomotion Stairs: Yes Stairs Assistance: Minimal Assistance - Patient > 75% Stair Management Technique: One rail Left Number of Stairs: 12 Height of Stairs: 6 Wheelchair Mobility Wheelchair Mobility: Yes Wheelchair Assistance: Supervision/Verbal cueing Wheelchair Propulsion: Left lower extremity;Left upper extremity Wheelchair Parts Management: Needs assistance Distance: 150   Discharge Criteria: Patient will be discharged from PT if patient refuses treatment 3 consecutive times without medical reason, if treatment  goals not met, if there is a change in medical status, if patient makes no progress towards goals or if patient is discharged from hospital.  The above assessment, treatment plan, treatment alternatives and goals were discussed and mutually agreed upon: by patient  Cruzita Lederer Kaleesi Guyton, PT, DPT 07/21/2021, 3:48 PM

## 2021-07-21 NOTE — Progress Notes (Signed)
Inpatient Rehabilitation  Patient information reviewed and entered into eRehab system by Patra Gherardi M. Bridie Colquhoun, M.A., CCC/SLP, PPS Coordinator.  Information including medical coding, functional ability and quality indicators will be reviewed and updated through discharge.    

## 2021-07-21 NOTE — Plan of Care (Signed)
°  Problem: RH Furniture Transfers Goal: LTG Patient will perform furniture transfers w/assist (OT/PT) Description: LTG: Patient will perform furniture transfers  with assistance (OT/PT). Flowsheets (Taken 07/21/2021 1245 by Howerton-Davis, Destanae R, OT) LTG: Pt will perform furniture transfers with assist:: Supervision/Verbal cueing   Problem: RH Balance Goal: LTG Patient will maintain dynamic standing balance (PT) Description: LTG:  Patient will maintain dynamic standing balance with assistance during mobility activities (PT) Flowsheets (Taken 07/21/2021 1601) LTG: Pt will maintain dynamic standing balance during mobility activities with:: (LRAD) Supervision/Verbal cueing   Problem: Sit to Stand Goal: LTG:  Patient will perform sit to stand with assistance level (PT) Description: LTG:  Patient will perform sit to stand with assistance level (PT) Flowsheets (Taken 07/21/2021 1601) LTG: PT will perform sit to stand in preparation for functional mobility with assistance level: (LRAD) Independent with assistive device   Problem: RH Bed to Chair Transfers Goal: LTG Patient will perform bed/chair transfers w/assist (PT) Description: LTG: Patient will perform bed to chair transfers with assistance (PT). Flowsheets (Taken 07/21/2021 1601) LTG: Pt will perform Bed to Chair Transfers with assistance level: (LRAD) Independent with assistive device    Problem: RH Car Transfers Goal: LTG Patient will perform car transfers with assist (PT) Description: LTG: Patient will perform car transfers with assistance (PT). Flowsheets (Taken 07/21/2021 1601) LTG: Pt will perform car transfers with assist:: (LRAD) Supervision/Verbal cueing   Problem: RH Ambulation Goal: LTG Patient will ambulate in controlled environment (PT) Description: LTG: Patient will ambulate in a controlled environment, # of feet with assistance (PT). Flowsheets (Taken 07/21/2021 1601) LTG: Pt will ambulate in controlled environ  assist  needed:: (LRAD) Supervision/Verbal cueing LTG: Ambulation distance in controlled environment: 200' Goal: LTG Patient will ambulate in home environment (PT) Description: LTG: Patient will ambulate in home environment, # of feet with assistance (PT). Flowsheets (Taken 07/21/2021 1601) LTG: Pt will ambulate in home environ  assist needed:: (LRAD) Independent with assistive device LTG: Ambulation distance in home environment: 50'   Problem: RH Stairs Goal: LTG Patient will ambulate up and down stairs w/assist (PT) Description: LTG: Patient will ambulate up and down # of stairs with assistance (PT) Flowsheets (Taken 07/21/2021 1601) LTG: Pt will ambulate up/down stairs assist needed:: Supervision/Verbal cueing LTG: Pt will  ambulate up and down number of stairs: 8 Note: Using single handrail (per pt home set up)

## 2021-07-21 NOTE — Progress Notes (Signed)
Patient ID: Kurt Foley, male   DOB: Nov 12, 1975, 45 y.o.   MRN: [id number] Met with the patient to introduce self, role, rehab process, team conference and plan of care. Discussed situation,diet, nutritional supplements and rationale for increased calories and proteins. Patient is very drowsy but reported he slept well last night. Expressed pain, made aware of available meds; nurse made aware of need. Patient noted no other concerns at present. Continue to follow along to discharge to address educational needs and facilitate preparation for discharge home.

## 2021-07-22 DIAGNOSIS — G0490 Encephalitis and encephalomyelitis, unspecified: Secondary | ICD-10-CM

## 2021-07-22 LAB — CBC
HCT: 24.9 % — ABNORMAL LOW (ref 39.0–52.0)
Hemoglobin: 8.5 g/dL — ABNORMAL LOW (ref 13.0–17.0)
MCH: 28.8 pg (ref 26.0–34.0)
MCHC: 34.1 g/dL (ref 30.0–36.0)
MCV: 84.4 fL (ref 80.0–100.0)
Platelets: 305 10*3/uL (ref 150–400)
RBC: 2.95 MIL/uL — ABNORMAL LOW (ref 4.22–5.81)
RDW: 16.7 % — ABNORMAL HIGH (ref 11.5–15.5)
WBC: 2.9 10*3/uL — ABNORMAL LOW (ref 4.0–10.5)
nRBC: 0 % (ref 0.0–0.2)

## 2021-07-22 LAB — ACID FAST SMEAR (AFB, MYCOBACTERIA)

## 2021-07-22 LAB — BLASTOMYCES ANTIGEN: Blastomyces Antigen: NOT DETECTED ng/mL

## 2021-07-22 NOTE — Progress Notes (Signed)
Occupational Therapy Session Note  Patient Details  Name: Kurt Foley MRN: [id number] Date of Birth: 30-Sep-1975  Today's Date: 07/22/2021 OT Individual Time: 1123-1204 OT Individual Time Calculation (min): 41 min    Short Term Goals: Week 1:  OT Short Term Goal 1 (Week 1): STG=LTG 2/2 ELOS  Skilled Therapeutic Interventions/Progress Updates:    Pt received in wc and agreeable to therapy.  Discussed that his stroke was very recent but [facility]  Pt has been managing at home fairly well post stroke but now is much weaker.  Discussed the areas of function pt needs to focus on to be more Ind with ADLs.  Pt taken to gym to work on transfers, sit to stands from mat.  Splint removed and cleansed, hand cleansed.  Provided pt with stockinette for skin protection with splint.  With standing, Pt leans heavily to his L to compensate for R weakness. Had pt clasp hands and slide hands up and down R thigh for improved symmetry with sit to stands. Able to rise to stand and sit with CGA for numerous repetitions. Standing and trunk rotation with moving clasped hands side to side.   Seated PROM of RUE focusing on scapular ROM. Pt then worked on self ROM with hands clasped. Worked with pt on wrist and finger ROM.  He does have some active scapular retraction and elb flexion.    Pt transferred back to wc and then taken back to room to transfer to bed with CGA using hemiwalker. Pt resting in bed with all needs met.     Therapy Documentation Precautions:  Precautions Precautions: Fall Precaution Comments: R side hemi Required Braces or Orthoses: Splint/Cast Splint/Cast: aircast on R ankle, R resting hand splint (resting hand splint not in room at time of PT eval on 2/9) Restrictions Weight Bearing Restrictions: No    Pain: Pain Assessment Pain Scale: 0-10 Pain Score: 4  Pain Type: Chronic pain Pain Location: Back Pain Orientation:  Posterior;Lower Pain Descriptors / Indicators: Aching;Constant Pain Frequency: Constant Pain Onset: Progressive Patients Stated Pain Goal: 0 Pain Intervention(s): Medication (See eMAR) Multiple Pain Sites: No ADL: ADL Eating: Not assessed Grooming: Not assessed Upper Body Bathing: Not assessed Lower Body Bathing: Not assessed Upper Body Dressing: Not assessed Lower Body Dressing: Minimal assistance Where Assessed-Lower Body Dressing: Edge of bed Toileting: Minimal assistance Where Assessed-Toileting: Glass blower/designer: Psychiatric nurse Method: Human resources officer: Not assessed ADL Comments: Patient declined bathing at time of inital eval.   Therapy/Group: Individual Therapy  [place] 07/22/2021, 2:25 PM

## 2021-07-22 NOTE — Progress Notes (Signed)
PROGRESS NOTE   Subjective/Complaints: Pt says he slept well. Had some low back pain early. Feels fine now. Wearing his right WHO and air cast over ankle  ROS: Patient denies fever, rash, sore throat, blurred vision, dizziness, nausea, vomiting, diarrhea, cough, shortness of breath or chest pain,   headache, or mood change.    Objective:   No results found. Recent Labs    07/21/21 0514 07/22/21 0534  WBC 3.5* 2.9*  HGB 8.0* 8.5*  HCT 24.0* 24.9*  PLT 325 305   Recent Labs    07/20/21 0514 07/21/21 0514  NA 135 132*  K 4.1 4.2  CL 109 102  CO2 19* 19*  GLUCOSE 94 95  BUN 8 5*  CREATININE 0.76 0.78  CALCIUM 9.3 9.4    Intake/Output Summary (Last 24 hours) at 07/22/2021 2956 Last data filed at 07/22/2021 0800 Gross per 24 hour  Intake 960 ml  Output 1075 ml  Net -115 ml        Physical Exam: Vital Signs Blood pressure 104/80, pulse 93, temperature 98.6 F (37 C), temperature source Oral, resp. rate 18, height _0  (1.651 m), weight 53.7 kg, SpO2 100 %.  Constitutional: No distress . Vital signs reviewed. Frail appearing HEENT: NCAT, EOMI, oral membranes moist Neck: supple Cardiovascular: RRR without murmur. No JVD    Respiratory/Chest: CTA Bilaterally without wheezes or rales. Normal effort    GI/Abdomen: BS +, non-tender, non-distended Ext: no clubbing, cyanosis, or edema Psych: pleasant and cooperative  Neuro:  Alert and oriented x 3. Normal insight and awareness. Intact Memory. Normal language. Speech dysarthric. Left facial weakness. RUE 0/5. RLE 3- prox to 0/5 distally. Fair PROM. Decreased sensation to LT on right Musculoskeletal: right shoulder remains tight, some pain with PROM. Low back TTP.   Assessment/Plan: 1. Functional deficits which require 3+ hours per day of interdisciplinary therapy in a comprehensive inpatient rehab setting. Physiatrist is providing close team supervision and 24  hour management of active medical problems listed below. Physiatrist and rehab team continue to assess barriers to discharge/monitor patient progress toward functional and medical goals  Care Tool:  Bathing  Bathing activity did not occur: Refused           Bathing assist       Upper Body Dressing/Undressing Upper body dressing Upper body dressing/undressing activity did not occur (including orthotics): Refused What is the patient wearing?: Pull over shirt    Upper body assist Assist Level: Minimal Assistance - Patient > 75%    Lower Body Dressing/Undressing Lower body dressing      What is the patient wearing?: Pants     Lower body assist Assist for lower body dressing: Minimal Assistance - Patient > 75%     Toileting Toileting    Toileting assist Assist for toileting: Minimal Assistance - Patient > 75%     Transfers Chair/bed transfer  Transfers assist     Chair/bed transfer assist level: Minimal Assistance - Patient > 75%     Locomotion Ambulation   Ambulation assist      Assist level: Minimal Assistance - Patient > 75% Assistive device: Walker-hemi Max distance: 150   Walk 10 feet  activity   Assist     Assist level: Minimal Assistance - Patient > 75% Assistive device: Walker-hemi   Walk 50 feet activity   Assist    Assist level: Minimal Assistance - Patient > 75% Assistive device: Walker-hemi    Walk 150 feet activity   Assist    Assist level: Minimal Assistance - Patient > 75% Assistive device: Walker-hemi    Walk 10 feet on uneven surface  activity   Assist Walk 10 feet on uneven surfaces activity did not occur: Safety/medical concerns (R hemi, fatigue)         Wheelchair     Assist Is the patient using a wheelchair?: Yes Type of Wheelchair: Manual    Wheelchair assist level: Set up assist Max wheelchair distance: 150    Wheelchair 50 feet with 2 turns activity    Assist        Assist Level:  Supervision/Verbal cueing   Wheelchair 150 feet activity     Assist      Assist Level: Supervision/Verbal cueing   Blood pressure 104/80, pulse 93, temperature 98.6 F (37 C), temperature source Oral, resp. rate 18, height _0  (1.651 m), weight 53.7 kg, SpO2 100 %.  Medical Problem List and Plan: 1. 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  Antithrombotics: -DVT/anticoagulation:  Pharmaceutical: Lovenox             -antiplatelet therapy: ASA 3. Chronic LBP/Pain Management:   --Oxycodone 10 mg has been effective in the past-   -receiving 5-87m scheduled at night as well as prn --Lidocaine patches at nights (discomfort due to bed).  -voltaren gel for right shoulder pain 2/10 pain seems controlled at present 4. Mood: LCSW to follow for evaluation and support.              -antipsychotic agents: N/A 5. Neuropsych: This patient is  capable of making decisions on his own behalf. 6. Skin/Wound Care: Routine pressure relief measures.  7. Fluids/Electrolytes/Nutrition/FTT: encourage po  -I personally reviewed the patient's labs today.    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  9. 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 Continue Biktarvy             --on Bactrim for PJP prophylaxis.  10. Pancytopenia: Neutropenia with WBC downward trend form 2.8-->1.5             -2/10 cbc reviewed. Wbc's hovering around 3k, hgb around 8-8.5  -recheck Monday    11. Insomnia: Encouraged use of Remeron and Seroquel also to help with sleep.  Increased Remeron to 169m             --pain control 12. Tachycardia: Monitor HR/BP TID- Monitor HR with  activity  -90's at rest   14. Abnormal LFTs: Continue to hold Lipitor. Will hold motrin also for now.  -  LFTs stable, sl improved 2/9.   15. COPD: Continue MDI.        LOS: 2 days A FACE TO FACE EVALUATION WAS PERFORMED  ZaMeredith Staggers/03/2022, 9:24 AM

## 2021-07-22 NOTE — IPOC Note (Signed)
Overall Plan of Care [facility]) Patient Details Name: Kurt Foley MRN: [id number] DOB: 1976-04-26  Admitting Diagnosis: Encephalitis  Hospital Problems: Principal Problem:   Encephalitis Active Problems:   Debility     Functional Problem List: Nursing Bowel, Medication Management, Safety, Pain, Endurance, Nutrition  PT Balance, Endurance, Motor, Nutrition, Pain, Perception, Safety, Sensory  OT Balance, Endurance, Motor, Safety, Sensory  SLP Cognition  TR         Basic ADLs: OT Grooming, Bathing, Eating, Dressing, Toileting     Advanced  ADLs: OT       Transfers: PT Bed Mobility, Bed to Chair, Car, Occupational psychologist, Research scientist (life sciences): PT Ambulation, Psychologist, prison and probation services, Stairs     Additional Impairments: OT Fuctional Use of Upper Extremity  SLP Social Cognition   Problem Solving, Memory, Attention  TR      Anticipated Outcomes Item Anticipated Outcome  Self Feeding I  Swallowing      Basic self-care  Supervision A  Toileting  Supervision A   Bathroom Transfers Supervision A  Bowel/Bladder  Manage bowel w mod I assist  Transfers  Mod I  Locomotion  S*  Communication  spv  Cognition  spv  Pain  pain at or below level 4 with prns  Safety/Judgment  maintain safety with cues   Therapy Plan: PT Intensity: Minimum of 1-2 x/day ,45 to 90 minutes PT Frequency: 5 out of 7 days PT Duration Estimated Length of Stay: 8-12 days OT Intensity: Minimum of 1-2 x/day, 45 to 90 minutes OT Frequency: 5 out of 7 days OT Duration/Estimated Length of Stay: 6-12 days SLP Intensity: Minumum of 1-2 x/day, 30 to 90 minutes SLP Frequency: 3 to 5 out of 7 days SLP Duration/Estimated Length of Stay: 10-14  days   Due to the current state of emergency, patients may not be receiving their 3-hours of Medicare-mandated therapy.   Team Interventions: Nursing Interventions Disease Management/Prevention, Medication Management, Discharge Planning, Pain  Management, Bowel Management, Patient/Family Education  PT interventions Ambulation/gait training, Community reintegration, DME/adaptive equipment instruction, Neuromuscular re-education, Psychosocial support, Stair training, UE/LE Strength taining/ROM, Wheelchair propulsion/positioning, UE/LE Coordination activities, Skin care/wound management, Therapeutic Activities, Pain management, Functional electrical stimulation, Discharge planning, Warden/ranger, Cognitive remediation/compensation, Disease management/prevention, Functional mobility training, Patient/family education, Splinting/orthotics, Therapeutic Exercise, Visual/perceptual remediation/compensation  OT Interventions Warden/ranger, Community reintegration, Discharge planning, DME/adaptive equipment instruction, Functional electrical stimulation, Functional mobility training, Neuromuscular re-education, Patient/family education, Self Care/advanced ADL retraining, Splinting/orthotics, Therapeutic Activities, Therapeutic Exercise, UE/LE Strength taining/ROM, UE/LE Coordination activities  SLP Interventions Cognitive remediation/compensation, Internal/external aids, Environmental controls, Speech/Language facilitation, Therapeutic Activities, Cueing hierarchy, Functional tasks, Patient/family education, Therapeutic Exercise  TR Interventions    SW/CM Interventions Discharge Planning, Psychosocial Support, Patient/Family Education   Barriers to Discharge MD  Medical stability  Nursing Decreased caregiver support, Insurance for SNF coverage 1 level 3 ste right rail w aunt; who works 4a-5p daily  PT Decreased caregiver support, Home environment Best boy, Lack of/limited family support    OT Inaccessible home environment, Decreased caregiver support    SLP      SW Insurance for SNF coverage, Decreased caregiver support, Medication compliance     Team Discharge Planning: Destination: PT-Home ,OT- Home ,  SLP-Home Projected Follow-up: PT-Home health PT, OT-  Outpatient OT, SLP-Outpatient SLP, Home Health SLP Projected Equipment Needs: PT-To be determined, OT- To be determined, SLP-None recommended by SLP Equipment Details: PT- , OT-  Patient/family involved in discharge planning: PT- Patient,  OT-Patient, SLP-Patient  MD  ELOS: 8-12 days Medical Rehab Prognosis:  Excellent Assessment: The patient has been admitted for CIR therapies with the diagnosis of JC encephalitis, debility. The team will be addressing functional mobility, strength, stamina, balance, safety, adaptive techniques and equipment, self-care, bowel and bladder mgt, patient and caregiver education, NMR, orthotic mgt, pain control, nutrition, communication/cognition. Goals have been set at supervision with self-care and sup/mod I with mobility, sup with cognition and communication.   Due to the current state of emergency, patients may not be receiving their 3 hours per day of Medicare-mandated therapy.    Ranelle Oyster, MD, FAAPMR     See Team Conference Notes for weekly updates to the plan of care

## 2021-07-22 NOTE — Progress Notes (Signed)
Physical Therapy Session Note  Patient Details  Name: Kurt Foley MRN: [id number] Date of Birth: June 15, 1975  Today's Date: 07/22/2021 PT Individual Time: 1303-1402 PT Individual Time Calculation (min): 59 min   Short Term Goals: Week 1:  PT Short Term Goal 1 (Week 1): STG = LTG due to ELOS  Skilled Therapeutic Interventions/Progress Updates:    Pt initially resting, asily awakened and agreeable to session.  Supine to sit w/cga. stand pivot transfer to wc w/min assist, no AD, decreased controlled placment RLE. Transported to gym. stand pivot transfer to mat w/min assist as above.  Therex w/emphasis on RLE strengthening to promote : LAQs HS curls w/red tb resistance Hip abd w/red TB resistance and overpressure to L to facitliate R activation.   Standing R TKE against manual resistance 2x10 progressing to maintaining L quad activation while tapping L toes for activation of RLE w/increased wbing/stance control.  Gait 7ft no AD w/min assist of 1, extension thrust at loading, trendelenberg, step to gait tendency.  Sit to supine to prone w/min assist, R wrist splint removed for comfort. Prone R hamstrincg curls x 15 w/min assist to contrl eccentric lowering. Prone R hip extension w/therapist stabilizing knee in 90* flexion x 10 reps.  Prone to quadruped w/min assist and direct verbal cues for sequencing.  Quadruped to tall kneeling w/min assist/cues.  Pt initially min assist for stability in tall kneeling, mod assist to reposition Les for equal wbing.  In tall kneeling, worked on wt shifting/core stability via nods/head turns, reaching/cross body reaching progressing to trunk rotation, overall min to cga w/balance.  Tall kneeling to side sit to long sit to short sit w/min assist.    Gait 42ft to wc w/hemiwalker w/min assist. Pt transported to room. stand pivot transfer to bed w/min assist and cues, no AD.  Sit to supine w/cga and cues.  Pt left supine w/rails up x 3, alarm  set, bed in lowest position, and needs in reach.      Therapy Documentation Precautions:  Precautions Precautions: Fall Precaution Comments: R side hemi Required Braces or Orthoses: Splint/Cast Splint/Cast: aircast on R ankle, R resting hand splint (resting hand splint not in room at time of PT eval on 2/9) Restrictions Weight Bearing Restrictions: No    Therapy/Group: Individual Therapy Callie Fielding, [place] 07/22/2021, 2:45 PM

## 2021-07-22 NOTE — Progress Notes (Signed)
Physical Therapy Session Note  Patient Details  Name: Kurt Foley MRN: [id number] Date of Birth: 1976/01/24  Today's Date: 07/22/2021 PT Individual Time: 1000-1100 PT Individual Time Calculation (min): 60 min   Short Term Goals: Week 1:  PT Short Term Goal 1 (Week 1): STG = LTG due to ELOS  Skilled Therapeutic Interventions/Progress Updates:     Pt supine in bed to start session - agreeable to PT tx. Denies pain. Pt with R AIRCAST on upon arrival. Supine<>sit with supervision without bed features. Donned slip-on shoes (water shoes?) with totalA for time.   Sit<>stand to [facility] with CGA and ambulated ~137ft with CGA/minA and HW on level surfaces. Demonstrates step-to gait pattern, significant R genu recurvatum in stance (~90% of the time), and absent heel strike on R. Ambulated remaining distance ~149ft to his room with CGA/minA but introduced large based QC to progress LRAD. Similar gait deficits and assist level as above with pt reporting favoritism towards HW rather than QC.   Removed AIRCAST to assess strength - 0/5 ankle DF, 2+/5 ankle PF. Short distance gait trial of ambulating with AIRCAST to assist in determining bracing needs. Ambulated ~81ft with QC with minA, no ankle inversion or instability noted, primarily foot drop.   Retrieved [place] Walk-On to demo for gait and this was donned with totalA. Recommended to patient for family to bring in tennis shoes for improved fitting. Pt ambulated ~11ft with CGA/minA and QC with AFO to R foot - improved R heel strike and ability to manage genu recurvatum, occurring ~50% of the time. Continued to demonstrate step-to pattern with inconsistent step length.  Worked on repeated sit<>stands from mat table height with focus on eccentric lowering (counting to 5) for sitting, 2x10 repetitions.   Ambulated ~138ft with CGA/minA and QC towards his room with VC for progressing step-to pattern to step-through pattern - difficulty correcting this with  inconsistent step lengths on R.   Transported remaining distance back to room for time and energy conservation. Remained seated in w/c with chair alarm on, all needs within reach.    Therapy Documentation Precautions:  Precautions Precautions: Fall Precaution Comments: R side hemi Required Braces or Orthoses: Splint/Cast Splint/Cast: aircast on R ankle, R resting hand splint (resting hand splint not in room at time of PT eval on 2/9) Restrictions Weight Bearing Restrictions: No General:    Therapy/Group: Individual Therapy  Analeigh Aries P Markeshia Giebel PT 07/22/2021, 8:09 AM

## 2021-07-22 NOTE — Progress Notes (Signed)
Physical Therapy Session Note  Patient Details  Name: Kurt Foley MRN: [id number] Date of Birth: 25-Feb-1976  Today's Date: 07/22/2021 PT Individual Time: 2992-4268 PT Individual Time Calculation (min): 27 min   Short Term Goals: Week 1:  PT Short Term Goal 1 (Week 1): STG = LTG due to ELOS  Skilled Therapeutic Interventions/Progress Updates: Pt presented in bed agreeable to therapy. Pt denies pain during session. Performed supine to sit with supervision and use of bed features. Performed ambulatory transfer to w/c with HW and CGA. Pt transported to day room and participated in gait training with AFO including HW management while weaving through cones. Pt was able to perform with CGA and intermittent R genu recurvatum as well as increased R hip ER when performing turns. Pt also participated in toe taps to 4in step 2 x 10 for balance and reciprocal activity. Pt transported back to room and performed ambulatory transfer to bed. Pt returned to supine with supervision and left in bed with bed alarm on, call bell within reach and needs met.       Therapy Documentation Precautions:  Precautions Precautions: Fall Precaution Comments: R side hemi Required Braces or Orthoses: Splint/Cast Splint/Cast: aircast on R ankle, R resting hand splint (resting hand splint not in room at time of PT eval on 2/9) Restrictions Weight Bearing Restrictions: No General:   Vital Signs:   Pain: Pain Assessment Pain Scale: 0-10 Pain Score: 4  Pain Type: Chronic pain Pain Location: Back Pain Orientation: Posterior;Lower Pain Descriptors / Indicators: Aching;Constant Pain Frequency: Constant Pain Onset: Progressive Patients Stated Pain Goal: 0 Pain Intervention(s): Medication (See eMAR) Multiple Pain Sites: No Mobility:   Locomotion :    Trunk/Postural Assessment :    Balance:   Exercises:   Other Treatments:      Therapy/Group: Individual Therapy  Kurt Foley 07/22/2021, 3:40 PM

## 2021-07-23 MED ORDER — SORBITOL 70 % SOLN
30.0000 mL | Freq: Once | Status: AC
  Administered 2021-07-23: 30 mL via ORAL
  Filled 2021-07-23: qty 30

## 2021-07-23 NOTE — Progress Notes (Signed)
Occupational Therapy Session Note  Patient Details  Name: Kurt Foley MRN: [id number] Date of Birth: 21-Aug-1975  Today's Date: 07/23/2021 OT Individual Time: 9562-1308 OT Individual Time Calculation (min): 53 min    Short Term Goals: Week 1:  OT Short Term Goal 1 (Week 1): STG=LTG 2/2 ELOS  Skilled Therapeutic Interventions/Progress Updates:  Skilled OT intervention completed with focus on functional sit > stands and transfers, RUE NMR, visual scanning, activity tolerance. Pt received on side in bed, agreeable to session. Pt completed bed mobility with mod I, then sit > stand with hemi walker and stand pivot with CGA to w/c. Transported pt with total A for energy conservation in w/c <> therapy gym.   Using the BITS, pt maintained dynamic standing balance without AD, to promote increased challenge with weight shifting in R side. Pt participated in the bell cancellation assessment, with 1 error, 4 misses on the R side. Pt required increased time during task with lots of re-scanning in the same area, and required CGA at the hips for occasional correction of balance when shifting weight or side stepping to R side of screen for ease of reaching. Educated pt on purpose of activity, with pt stating that he doesn't notice any vision challenges on the R, but required min A to locate all of his errors on R when prompted to count the bells in red. Cues needed for wide BOS and spacing feet out for increased balance strategy.  Seated EOM, pt completed x12 sit > stands for ADL promotion without AD using L hand on L thigh for assist in standing vs compensatory knee extension on mat behind pt. Pt able to complete stand at this level with visual feedback provided by the mirror for increased midline orientation, and min to min guard assist for powering up.   Pt doffed R hand splint with supervision, and cues needed for unstrapping all straps. Seated at table top, pt utilized R hand in NMR via cup and water  pouring task, to promote increased function in his dominant side. Therapist utilized hand over hand technique with min tone noted in digits/palm which served as a functional grasp, however therapist used tenodesis technique to inhibit tone for extension of digits for set up. Educated pt on using supinated/tenodesis technique to increase straightening of the fingers for donning R splint. Pt able to donn stockinette with mod A, and splint with mod A. Transferred pt back to room in w/c, with pt left on his side in bed, with bed alarm on and all needs in reach at end of session.      Therapy Documentation Precautions:  Precautions Precautions: Fall Precaution Comments: R side hemi Required Braces or Orthoses: Splint/Cast Splint/Cast: aircast on R ankle, R resting hand splint (resting hand splint not in room at time of PT eval on 2/9) Restrictions Weight Bearing Restrictions: No  Pain: No c/o pain   Therapy/Group: Individual Therapy  Dayveon Halley E Wiletta Bermingham 07/23/2021, 7:41 AM

## 2021-07-23 NOTE — Progress Notes (Signed)
Occupational Therapy Session Note  Patient Details  Name: Kurt Foley MRN: [id number] Date of Birth: 1975/07/14  Today's Date: 07/23/2021 OT Individual Time: 0254-2706 OT Individual Time Calculation (min): 56 min    Short Term Goals: Week 1:  OT Short Term Goal 1 (Week 1): STG=LTG 2/2 ELOS  Skilled Therapeutic Interventions/Progress Updates:    Pt seen this session for ADL training with a focus on balance, R side NMR, adaptive strategies. Offered pt a shower but he was initially hesitant afraid of being cold.  Assured pt I could make the environment more comfortable.   Pt able to sit to EOB and stand with hemi walker with close S. He then ambulated to sit on elevated toilet to undress.  Pt then transferred to shower to bathe.  Min A needed to reach L arm due to R hemiparesis.  Pt stood in shower with min A so he could wash his bottom.  Cues for safe foot placement with sit to stands.   Pt needed cues for hemidressing techniques as he was never trained with them and has been getting help from his aunt.  Min A with donning clothing.  Total A with AFO,  pt needs a sneaker as he only has pool shoes.  Pt then worked on controlled sit to stands and standing balance with a focus on using RLE.  In seated, RUE NMR with [place] as pt does have some active abd and retraction and a trace level.  During self care used "forced":use movements with arm. Reapplied hand splint.  Pt resting in recliner with alarm belt on to prepare for lunch. All needs met.   Therapy Documentation Precautions:  Precautions Precautions: Fall Precaution Comments: R side hemi Required Braces or Orthoses: Splint/Cast Splint/Cast: aircast on R ankle, R resting hand splint (resting hand splint not in room at time of PT eval on 2/9) Restrictions Weight Bearing Restrictions: No    Pain: no c/o pain    ADL: ADL Eating: Set up Grooming: Setup Upper Body Bathing: Minimal assistance Where Assessed-Upper Body Bathing:  Shower Lower Body Bathing: Minimal assistance Where Assessed-Lower Body Bathing: Shower Upper Body Dressing: Minimal assistance Lower Body Dressing: Minimal assistance Where Assessed-Lower Body Dressing: Edge of bed Toileting: Minimal assistance Where Assessed-Toileting: Glass blower/designer: Psychiatric nurse Method: Human resources officer: Not assessed Social research officer, government: Curator Method: Heritage manager: Radio broadcast assistant, Grab bars   Therapy/Group: Individual Therapy  [place] 07/23/2021, 1:03 PM

## 2021-07-23 NOTE — Progress Notes (Signed)
Physical Therapy Session Note  Patient Details  Name: Kurt Foley MRN: [id number] Date of Birth: [birth year]  Today's Date: 07/23/2021 PT Individual Time: 0902-1000 PT Individual Time Calculation (min): 58 min   Short Term Goals: Week 1:  PT Short Term Goal 1 (Week 1): STG = LTG due to ELOS  Skilled Therapeutic Interventions/Progress Updates:  Patient asleep in bed on entrance to room. Patient easily roused, quickly alert and agreeable to PT session.   Patient with no pain complaint throughout session.  Therapeutic Activity: Bed Mobility: Patient performed supine --> sit impulsively with supervision. Donned shoes and AFO with TotalA for time. Lace up shoes not in room yet.  Transfers: Patient performed sit<>stand and stand pivot transfers throughout session with supervision/ CGA using HW. Provided verbal cues for positioning and intermittent cues for reaching back for armrests prior to descent to sit. At end of session, pt uses bedrail to stand pivot with CGA to sit EOB.  Neuromuscular Re-ed: NMR facilitated during session with focus on standing balance, muscle activation/ muscle fiber recruitment, proprioception. Pt guided in toe touches to 6" step using RLE. First bout RLE only to first step x20, then progressed to first, then second, back to first. All with focus on increasing hip flexion to clear step and quad/ hamstring use to extend/ flex knee. Progressed to dynamic forward lunges from 2nd step.    NMR performed for improvements in motor control and coordination, balance, sequencing, judgement, and self confidence/ efficacy in performing all aspects of mobility at highest level of independence.  Gait Training:  Patient ambulated 16' x1/ 100' x1 using HW with focus on increasing HW placement and decreasing RLE step length. Cued to step R foot parallel to [facility] and step through with LLE. Requires cueing to improve quality of gait throughout bouts.   Patient seated on EOB at end of  session with brakes locked, bed alarm set, and all needs within reach.     Therapy Documentation Precautions:  Precautions Precautions: Fall Precaution Comments: R side hemi Required Braces or Orthoses: Splint/Cast Splint/Cast: aircast on R ankle, R resting hand splint (resting hand splint not in room at time of PT eval on 2/9) Restrictions Weight Bearing Restrictions: No General:   Vital Signs: Therapy Vitals Temp: 98 F (36.7 C) Pulse Rate: (!) 106 Resp: 16 BP: 106/79 Patient Position (if appropriate): Lying Oxygen Therapy SpO2: 98 % O2 Device: Room Air Pain:  No pain complaint this session.   Therapy/Group: Individual Therapy  Loel Dubonnet PT, DPT 07/23/2021, 8:24 AM

## 2021-07-24 NOTE — Progress Notes (Signed)
PROGRESS NOTE   Subjective/Complaints:  Pt reports no issues- LBM today.    ROS:  Pt denies SOB, abd pain, CP, N/V/C/D, and vision changes  Objective:   No results found. Recent Labs    07/22/21 0534  WBC 2.9*  HGB 8.5*  HCT 24.9*  PLT 305   No results for input(s): NA, K, CL, CO2, GLUCOSE, BUN, CREATININE, CALCIUM in the last 72 hours.   Intake/Output Summary (Last 24 hours) at 07/24/2021 1429 Last data filed at 07/24/2021 0830 Gross per 24 hour  Intake 358 ml  Output 950 ml  Net -592 ml        Physical Exam: Vital Signs Blood pressure 100/85, pulse 99, temperature 98.5 F (36.9 C), temperature source Oral, resp. rate 18, height _0  (1.651 m), weight 53.7 kg, SpO2 98 %.   General: awake, alert, appropriate, NAD HENT: conjugate gaze; oropharynx moist CV: regular rate; no JVD Pulmonary: CTA B/L; no W/R/R- good air movement GI: soft, NT, ND, (+)BS Psychiatric: appropriate Neurological: Ox3  Ext: no clubbing, cyanosis, or edema Psych: pleasant and cooperative  Neuro:  Alert and oriented x 3. Normal insight and awareness. Intact Memory. Normal language. Speech dysarthric. Left facial weakness. RUE 0/5. RLE 3- prox to 0/5 distally. Fair PROM. Decreased sensation to LT on right Musculoskeletal: right shoulder remains tight, some pain with PROM. Low back TTP.   Assessment/Plan: 1. Functional deficits which require 3+ hours per day of interdisciplinary therapy in a comprehensive inpatient rehab setting. Physiatrist is providing close team supervision and 24 hour management of active medical problems listed below. Physiatrist and rehab team continue to assess barriers to discharge/monitor patient progress toward functional and medical goals  Care Tool:  Bathing  Bathing activity did not occur: Refused           Bathing assist       Upper Body Dressing/Undressing Upper body dressing Upper body  dressing/undressing activity did not occur (including orthotics): Refused What is the patient wearing?: Pull over shirt    Upper body assist Assist Level: Minimal Assistance - Patient > 75%    Lower Body Dressing/Undressing Lower body dressing      What is the patient wearing?: Pants     Lower body assist Assist for lower body dressing: Minimal Assistance - Patient > 75%     Toileting Toileting    Toileting assist Assist for toileting: Minimal Assistance - Patient > 75%     Transfers Chair/bed transfer  Transfers assist     Chair/bed transfer assist level: Minimal Assistance - Patient > 75%     Locomotion Ambulation   Ambulation assist      Assist level: Minimal Assistance - Patient > 75% Assistive device: Walker-hemi Max distance: 150   Walk 10 feet activity   Assist     Assist level: Minimal Assistance - Patient > 75% Assistive device: Walker-hemi   Walk 50 feet activity   Assist    Assist level: Minimal Assistance - Patient > 75% Assistive device: Walker-hemi    Walk 150 feet activity   Assist    Assist level: Minimal Assistance - Patient > 75% Assistive device: [employer]  10 feet on uneven surface  activity   Assist Walk 10 feet on uneven surfaces activity did not occur: Safety/medical concerns (R hemi, fatigue)         Wheelchair     Assist Is the patient using a wheelchair?: Yes Type of Wheelchair: Manual    Wheelchair assist level: Set up assist Max wheelchair distance: 150    Wheelchair 50 feet with 2 turns activity    Assist        Assist Level: Supervision/Verbal cueing   Wheelchair 150 feet activity     Assist      Assist Level: Supervision/Verbal cueing   Blood pressure 100/85, pulse 99, temperature 98.5 F (36.9 C), temperature source Oral, resp. rate 18, height _0  (1.651 m), weight 53.7 kg, SpO2 98 %.  Medical Problem List and Plan: 1. 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  Antithrombotics: -DVT/anticoagulation:  Pharmaceutical: Lovenox             -antiplatelet therapy: ASA 3. Chronic LBP/Pain Management:   --Oxycodone 10 mg has been effective in the past-   -receiving 5-12m scheduled at night as well as prn --Lidocaine patches at nights (discomfort due to bed).  -voltaren gel for right shoulder pain 2/12- pain controlled- denies pain- con't regimen 4. Mood: LCSW to follow for evaluation and support.              -antipsychotic agents: N/A 5. Neuropsych: This patient is  capable of making decisions on his own behalf. 6. Skin/Wound Care: Routine pressure relief measures.  7. Fluids/Electrolytes/Nutrition/FTT: encourage po  -I personally reviewed the patient's labs today.    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  9. 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 Continue Biktarvy             --on Bactrim for PJP prophylaxis.  10. Pancytopenia: Neutropenia with WBC downward trend form 2.8-->1.5             -2/10 cbc reviewed. Wbc's hovering around 3k, hgb around 8-8.5  -recheck Monday    11. Insomnia: Encouraged use of Remeron and Seroquel also to help with sleep.  Increased Remeron to 142m             --pain control  2/12- sleeping well- con't regimen 12. Tachycardia: Monitor HR/BP TID- Monitor HR with activity  -90's at rest   14. Abnormal LFTs: Continue to hold Lipitor. Will hold motrin also for now.  -  LFTs stable, sl improved 2/9.   15. COPD: Continue MDI.        LOS: 4 days A FACE TO FACE EVALUATION WAS PERFORMED  Enedina Pair 07/24/2021, 2:29 PM

## 2021-07-25 LAB — CBC
HCT: 28.6 % — ABNORMAL LOW (ref 39.0–52.0)
Hemoglobin: 9.1 g/dL — ABNORMAL LOW (ref 13.0–17.0)
MCH: 27.8 pg (ref 26.0–34.0)
MCHC: 31.8 g/dL (ref 30.0–36.0)
MCV: 87.5 fL (ref 80.0–100.0)
Platelets: 424 10*3/uL — ABNORMAL HIGH (ref 150–400)
RBC: 3.27 MIL/uL — ABNORMAL LOW (ref 4.22–5.81)
RDW: 17.2 % — ABNORMAL HIGH (ref 11.5–15.5)
WBC: 2.8 10*3/uL — ABNORMAL LOW (ref 4.0–10.5)
nRBC: 0 % (ref 0.0–0.2)

## 2021-07-25 LAB — BASIC METABOLIC PANEL
Anion gap: 11 (ref 5–15)
BUN: 13 mg/dL (ref 6–20)
CO2: 18 mmol/L — ABNORMAL LOW (ref 22–32)
Calcium: 10 mg/dL (ref 8.9–10.3)
Chloride: 105 mmol/L (ref 98–111)
Creatinine, Ser: 0.91 mg/dL (ref 0.61–1.24)
GFR, Estimated: >60 mL/min (ref >60)
Glucose, Bld: 103 mg/dL — ABNORMAL HIGH (ref 70–99)
Potassium: 4.3 mmol/L (ref 3.5–5.1)
Sodium: 134 mmol/L — ABNORMAL LOW (ref 135–145)

## 2021-07-25 NOTE — Progress Notes (Signed)
PROGRESS NOTE   Subjective/Complaints:  No issues overnight , working with SLP this am   ROS:  Pt denies SOB, abd pain, CP, N/V/C/D, and vision changes  Objective:   No results found. Recent Labs    07/25/21 0620  WBC 2.8*  HGB 9.1*  HCT 28.6*  PLT 424*    Recent Labs    07/25/21 0620  NA 134*  K 4.3  CL 105  CO2 18*  GLUCOSE 103*  BUN 13  CREATININE 0.91  CALCIUM 10.0     Intake/Output Summary (Last 24 hours) at 07/25/2021 0802 Last data filed at 07/25/2021 0500 Gross per 24 hour  Intake --  Output 1375 ml  Net -1375 ml         Physical Exam: Vital Signs Blood pressure 111/88, pulse (!) 106, temperature 98.2 F (36.8 C), temperature source Oral, resp. rate 16, height _0  (1.651 m), weight 53.7 kg, SpO2 100 %.    General: No acute distress Mood and affect are appropriate Heart: Regular rate and rhythm no rubs murmurs or extra sounds Lungs: Clear to auscultation, breathing unlabored, no rales or wheezes Abdomen: Positive bowel sounds, soft nontender to palpation, nondistended Extremities: No clubbing, cyanosis, or edema Skin: No evidence of breakdown, no evidence of rash  Ext: no clubbing, cyanosis, or edema Psych: pleasant and cooperative  Neuro:  Alert and oriented x 3. Normal insight and awareness. Intact Memory. Normal language. Speech dysarthric. Left facial weakness. RUE 0/5. RLE 3- prox to 0/5 distally. Fair PROM. Decreased sensation to LT on right Musculoskeletal: right shoulder remains tight, some pain with PROM. Low back TTP.   Assessment/Plan: 1. Functional deficits which require 3+ hours per day of interdisciplinary therapy in a comprehensive inpatient rehab setting. Physiatrist is providing close team supervision and 24 hour management of active medical problems listed below. Physiatrist and rehab team continue to assess barriers to discharge/monitor patient progress toward  functional and medical goals  Care Tool:  Bathing  Bathing activity did not occur: Refused           Bathing assist       Upper Body Dressing/Undressing Upper body dressing Upper body dressing/undressing activity did not occur (including orthotics): Refused What is the patient wearing?: Pull over shirt    Upper body assist Assist Level: Minimal Assistance - Patient > 75%    Lower Body Dressing/Undressing Lower body dressing      What is the patient wearing?: Pants     Lower body assist Assist for lower body dressing: Minimal Assistance - Patient > 75%     Toileting Toileting    Toileting assist Assist for toileting: Minimal Assistance - Patient > 75%     Transfers Chair/bed transfer  Transfers assist     Chair/bed transfer assist level: Minimal Assistance - Patient > 75%     Locomotion Ambulation   Ambulation assist      Assist level: Minimal Assistance - Patient > 75% Assistive device: Walker-hemi Max distance: 150   Walk 10 feet activity   Assist     Assist level: Minimal Assistance - Patient > 75% Assistive device: Walker-hemi   Walk 50 feet activity  Assist    Assist level: Minimal Assistance - Patient > 75% Assistive device: Walker-hemi    Walk 150 feet activity   Assist    Assist level: Minimal Assistance - Patient > 75% Assistive device: Walker-hemi    Walk 10 feet on uneven surface  activity   Assist Walk 10 feet on uneven surfaces activity did not occur: Safety/medical concerns (R hemi, fatigue)         Wheelchair     Assist Is the patient using a wheelchair?: Yes Type of Wheelchair: Manual    Wheelchair assist level: Set up assist Max wheelchair distance: 150    Wheelchair 50 feet with 2 turns activity    Assist        Assist Level: Supervision/Verbal cueing   Wheelchair 150 feet activity     Assist      Assist Level: Supervision/Verbal cueing   Blood pressure 111/88, pulse (!)  106, temperature 98.2 F (36.8 C), temperature source Oral, resp. rate 16, height _0  (1.651 m), weight 53.7 kg, SpO2 100 %.  Medical Problem List and Plan: 1. 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  Antithrombotics: -DVT/anticoagulation:  Pharmaceutical: Lovenox             -antiplatelet therapy: ASA 3. Chronic LBP/Pain Management:   --Oxycodone 10 mg has been effective in the past-   -receiving 5-39m scheduled at night as well as prn --Lidocaine patches at nights (discomfort due to bed).  -voltaren gel for right shoulder pain 2/13- pain controlled- denies pain- con't regimen 4. Mood: LCSW to follow for evaluation and support.              -antipsychotic agents: N/A 5. Neuropsych: This patient is  capable of making decisions on his own behalf. 6. Skin/Wound Care: Routine pressure relief measures.  7. Fluids/Electrolytes/Nutrition/FTT: encourage po  -I personally reviewed the patient's labs today.    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  9. 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 Continue Biktarvy             --on Bactrim for PJP prophylaxis.  10. Pancytopenia: Neutropenia with WBC downward trend form 2.8-->1.5             -2/10 cbc reviewed. Wbc's hovering around 3k, hgb around 8-8.5  -recheck 2/13 stable     11. Insomnia: Encouraged use of Remeron and Seroquel also to help with sleep.  Increased Remeron to 152m             --pain control  2/12- sleeping well- con't regimen 12. Tachycardia: Monitor HR/BP TID- Monitor HR with activity  -90's at rest   Vitals:   07/25/21 0505 07/25/21 0703  BP: 99/81 111/88  Pulse: (!) 112 (!)  106  Resp: 16 16  Temp: 98.7 F (37.1 C) 98.2 F (36.8 C)  SpO2: 99% 100%  BPs on low side so hesistant in starting BB, asymptomatic, may have deconditioning component  14. Abnormal LFTs: Continue to hold Lipitor. Will hold motrin also for now.  -  LFTs stable, sl improved 2/9.  15. COPD: Continue MDI. No wheezing        LOS: 5 days A FACE TO FACE EVALUATION WAS PERFORMED  Charlett Blake 07/25/2021, 8:02 AM

## 2021-07-25 NOTE — Progress Notes (Signed)
Occupational Therapy Session Note ° °Patient Details  °Name: Kurt Foley °MRN: [id number] °Date of Birth: 02/10/1976 ° °Today's Date: 07/25/2021 °OT Individual Time: 1302-1400 °OT Individual Time Calculation (min): 58 min  ° ° °Short Term Goals: °Week 1:  OT Short Term Goal 1 (Week 1): STG=LTG 2/2 ELOS ° °Skilled Therapeutic Interventions/Progress Updates:  °  Pt greeted semi-reclined in bed and agreeable to OT treatment session. Pt declined need for self care tasks or toileting. Pt ambulated to therapy gym using hemi walker with CGA and improved step length and minimal knee snap as he had AFO and tennis shoes on. R UE NMR in gravity eliminated sidelying position. OT removed R hand splint and provided skin care and splint care. OT assisted with washing R arm and hand, providing gentle stretching and ROM to all digits, wrist, and elbow. OT applied moisture wicking powder, washed splint, and changed out stockinette. In sidelying, OT facilitated elbow, shoulder, scapula ROM with trace activation noted at shoulder. Pt discussed PLOF with OT and goals for therapy. Pt ambulated back to room in similar fashion, but experienced some fatigue and required two standing rest breaks to recover. Pt left seated EOB with needs met, bed alarm on, call bell in reach, awaiting next therapy session. ° °Therapy Documentation °Precautions:  °Precautions °Precautions: Fall °Precaution Comments: R side hemi °Required Braces or Orthoses: Splint/Cast °Splint/Cast: aircast on R ankle, R resting hand splint (resting hand splint not in room at time of PT eval on 2/9) °Restrictions °Weight Bearing Restrictions: No °Pain: °Pain Assessment °Pain Score: 0-No pain ° ° °Therapy/Group: Individual Therapy ° ° S  °07/25/2021, 2:09 PM °

## 2021-07-25 NOTE — Progress Notes (Signed)
Physical Therapy Session Note  Patient Details  Name: Kurt Foley MRN: [id number] Date of Birth: [birth year]  Today's Date: 07/25/2021 PT Individual Time: 1400-1500 PT Individual Time Calculation (min): 60 min   Short Term Goals: Week 1:  PT Short Term Goal 1 (Week 1): STG = LTG due to ELOS  Skilled Therapeutic Interventions/Progress Updates:    Patient received sitting edge of bed, agreeable to PT. He denies pain. Patient ambulating to therapy gym with HW, AFO and CGA. Step-to gait pattern maintained with verbal cues for longer L step length. NMES applied to R ant tib with good response into dorsiflexion. Patient ambulating ~62ft with HW and no AFO with emphasis on volitional knee control without assist from AFO. Intermittent genu recurvatum noted. Crouched lateral side stepping with no AFO for improved knee control with noted decent carryover. Patient completing weight shifting task on Biodex with noted difficulty shifting weight to R LE. Patient ambulating back to his room with HW, AFO and CGA. Returning supine with supervision. Bed alarm on, call light within reach.   Therapy Documentation Precautions:  Precautions Precautions: Fall Precaution Comments: R side hemi Required Braces or Orthoses: Splint/Cast Splint/Cast: aircast on R ankle, R resting hand splint (resting hand splint not in room at time of PT eval on 2/9) Restrictions Weight Bearing Restrictions: No    Therapy/Group: Individual Therapy  Karoline Caldwell, PT, DPT, CBIS  07/25/2021, 7:30 AM

## 2021-07-25 NOTE — Progress Notes (Signed)
Speech Language Pathology Daily Session Note  Patient Details  Name: Kurt Foley MRN: [id number] Date of Birth: 08/07/1975  Today's Date: 07/25/2021 SLP Individual Time: 0805-0830 SLP Individual Time Calculation (min): 25 min  Short Term Goals: Week 1: SLP Short Term Goal 1 (Week 1): Patient will recall 2-3 speech intelligibility strategies to increase intelligibility when fatigued with minA verbal cues. SLP Short Term Goal 2 (Week 1): Patient will recall and demonstrate use of memory compensations to increase independence and safety at home with minA verbal cues. SLP Short Term Goal 3 (Week 1): Pt will participate in functional alternating attention activities with min A verbal cues SLP Short Term Goal 4 (Week 1): Pt will solve basic, home safety problems with minA verbal cues.  Skilled Therapeutic Interventions:  Skilled ST services focused on cognitive skills. Pt requested to consume breakfast tray when SLP entered room. Pt required set up assist and demonstrated problem solving skills navigating tray with mild delayed processing noted in response to SLP's questions. SLP facilitated recall of tranfers steps from bed to the hemi-walker. Pt initially stated left side weakness (pt has right side weakness) and supported pulling up on walker verse pressing up with strong hand from bed, when transferring from a sit to stand. SLP provided education on safe transferring steps. Pt then urgently requested to use the bathroom, requesting the urinal and need to stand up. SLP applied the gait belt and provided min A for sit to stand with walker. Pt was able to urinate and sit back in bed. Pt demonstrated poor safety awareness, pt required cues to set up environment for safe standing, having the walker in front of him. SLP provided education. Pt was left in room with call bell within reach and bed alarm set. SLP recommends to continue skilled services.  Pain Pain Assessment Pain Scale: 0-10 Pain  Score: 0-No pain Pain Type: Chronic pain Pain Descriptors / Indicators: Aching Pain Frequency: Constant Pain Onset: On-going Patients Stated Pain Goal: 3 Pain Intervention(s): Medication (See eMAR)  Therapy/Group: Individual Therapy  [name]  [facility] 07/25/2021, 12:50 PM

## 2021-07-25 NOTE — Progress Notes (Signed)
Occupational Therapy Session Note  Patient Details  Name: BREXTON SOFIA MRN: [id number] Date of Birth: [birth year]  Today's Date: 07/25/2021 OT Individual Time: 1045-1130 OT Individual Time Calculation (min): 45 min    Short Term Goals: Week 1:  OT Short Term Goal 1 (Week 1): STG=LTG 2/2 ELOS  Skilled Therapeutic Interventions/Progress Updates:    Pt received in bed dressed and ready for therapy. He showed me that his family brought in regular tennis shoes.  Placed AFO in R shoe and helped pt don shoe.  During session he worked on sit to stands and taking steps with hemiwalker several times with min A.  He stated AFO with tennis shoe much more comfortable than previous shoes.    Adjusted velcro straps, replacing some straps on pt's R hand splint.  Pt taken to main gym to work on forced use of R hand and leg with NMR of a/arom of sh flex/ext and sit to stands with using legs only, and standing with rotating hands from hip to hips and reaching hands forward.    Pt participated well and is very eager to do therapy. Pt taken back to room to transfer back to bed. All needs met.  Alarm set.   Therapy Documentation Precautions:  Precautions Precautions: Fall Precaution Comments: R side hemi Required Braces or Orthoses: Splint/Cast Splint/Cast: aircast on R ankle, R resting hand splint (resting hand splint not in room at time of PT eval on 2/9) Restrictions Weight Bearing Restrictions: No    Vital Signs: Therapy Vitals Temp: 98.2 F (36.8 C) Temp Source: Oral Pulse Rate: (!) 106 Resp: 16 BP: 111/88 Patient Position (if appropriate): Lying Oxygen Therapy SpO2: 100 % O2 Device: Room Air  Pain: no c/o pain       Therapy/Group: Individual Therapy  [place] 07/25/2021, 8:29 AM

## 2021-07-26 MED ORDER — QUETIAPINE FUMARATE 50 MG PO TABS
200.0000 mg | ORAL_TABLET | Freq: Every day | ORAL | Status: DC
  Administered 2021-07-26 – 2021-08-05 (×11): 200 mg via ORAL
  Filled 2021-07-26 (×12): qty 4

## 2021-07-26 MED ORDER — ETHAMBUTOL HCL 400 MG PO TABS
800.0000 mg | ORAL_TABLET | Freq: Every day | ORAL | Status: DC
  Administered 2021-07-27 – 2021-08-06 (×11): 800 mg via ORAL
  Filled 2021-07-26 (×11): qty 2

## 2021-07-26 MED ORDER — MIRTAZAPINE 15 MG PO TABS
15.0000 mg | ORAL_TABLET | Freq: Every day | ORAL | Status: DC
  Administered 2021-07-26 – 2021-08-05 (×11): 15 mg via ORAL
  Filled 2021-07-26 (×11): qty 1

## 2021-07-26 MED ORDER — ETHAMBUTOL HCL 400 MG PO TABS
400.0000 mg | ORAL_TABLET | Freq: Once | ORAL | Status: AC
  Administered 2021-07-26: 400 mg via ORAL
  Filled 2021-07-26 (×2): qty 1

## 2021-07-26 NOTE — Progress Notes (Signed)
Speech Language Pathology Daily Session Note  Patient Details  Name: Kurt Foley MRN: [id number] Date of Birth: 1976-02-22  Today's Date: 07/26/2021 SLP Individual Time: 1300-1403 SLP Individual Time Calculation (min): 63 min  Short Term Goals: Week 1: SLP Short Term Goal 1 (Week 1): Patient will recall 2-3 speech intelligibility strategies to increase intelligibility when fatigued with minA verbal cues. SLP Short Term Goal 2 (Week 1): Patient will recall and demonstrate use of memory compensations to increase independence and safety at home with minA verbal cues. SLP Short Term Goal 3 (Week 1): Pt will participate in functional alternating attention activities with min A verbal cues SLP Short Term Goal 4 (Week 1): Pt will solve basic, home safety problems with minA verbal cues.  Skilled Therapeutic Interventions:Skilled ST services focused on speech and language skills. SLP facilitated basic problem solving and error awareness in 4 step ADL picture card sequencing tasks. Pt demonstrated 60% accuracy in task, but was able to correct errors with min A fade to supervision A verbal cues. Pt expressed frustration, "This does not help me. I need to focus on my walking. I know how to do laundry when I need it." SLP attempted to provide education pertaining to thinking through functional tasks for problem solving to reduce errors and safety risk, however pt was not receptive. Pt now supports only acute changes in delayed processing of expressing thoughts and slurred speech. SLP and Pt agreed to reduce ST services to x1-3 a week. SLP educated pt on speech intelligibility strategies, posted sign in room. Pt demonstrated ability to recall strategies with supervision A verbal cues to use visual aid in room and demonstrated 80% intelligibly at the simple sentence level in picture description task with supervision A verbal cues to over articulate and pause between words. As SLP was leaving pt attempted to  stand up and when questioned stated " I have to pee." Pt supported setting off the bed alarm verse pressing the call bell when he needed assistance. SLP provided education pertaining to safety protocol and methods to demonstrate improved safety awareness. NT and SLP assisted with set up of urinal and pt voided. Pt was left in room with call bell within reach and bed alarm set. SLP recommends to continue skilled services.     Pain Pain Assessment Pain Score: 0-No pain  Therapy/Group: Individual Therapy  Kurt Foley  [facility] 07/26/2021, 2:15 PM

## 2021-07-26 NOTE — Patient Care Conference (Signed)
Inpatient RehabilitationTeam Conference and Plan of Care Update Date: 07/26/2021   Time: 12:09 PM    Patient Name: Kurt Foley      Medical Record Number: [id number]  Date of Birth: 1975-10-07 Sex: Male         Room/Bed: 4M04C/4M04C-01 Payor Info: Payor: /    Admit Date/Time:  07/20/2021 12:57 PM  Primary Diagnosis:  Encephalitis  Hospital Problems: Principal Problem:   Encephalitis Active Problems:   Debility    Expected Discharge Date: Expected Discharge Date: 08/05/21  Team Members Present: Physician leading conference: Dr. Genice Rouge Social Worker Present: Dossie Der, LCSW Nurse Present: Chana Bode, RN PT Present: Wynelle Link, PT OT Present: Primitivo Gauze, OT SLP Present: Feliberto Gottron, SLP     Current Status/Progress Goal Weekly Team Focus  Bowel/Bladder     Continent        Swallow/Nutrition/ Hydration             ADL's   min A overall from an ambulatory level  supervision overall from an ambulatory level  balance, R side NMR, ADL training, pt education   Mobility   supervision bed mobility. CGA sit <> stand, transfers with HW and AFO, patient ambulating >150t with HW, AFO and CGA. Steps with MinA/CGA  ModI/supervision  transfers, gait progressions, dynamic balance, AFO consult, R LE NMR, transfers, pt/fam ed   Communication   mod-min A conversation  Supervision A conversation  speech intelligibility strategies   Safety/Cognition/ Behavioral Observations  mod-min A, poor safety awareness  Supervision A  basic problem solving, error awareness, recall and higher level attention   Pain     Oxy prn   Pain managed at or below level 4 with prn meds    Assess need for and effectiveness of prn medications  Skin     N/A          Discharge Planning:  Going home with aunt who works but Mom can check on during the day. Pt is making good progress in his therapies   Team Discussion: Patient with Myra Rude encephalopathy; and stroke less than  6 months ago without therapy. Patient participating with therapy and noting some movement in affected arm. Patient with poor appetite; encouraging po intake.  Patient on target to meet rehab goals: yes, currently progressing towards supervision goals. Progress limited by fatigue and posterior lean with poor safety awareness.  *See Care Plan and progress notes for long and short-term goals.   Revisions to Treatment Plan:  AFO consult   Teaching Needs: Safety, transfers, medications, secondary risk management, etc.  Current Barriers to Discharge: Decreased caregiver support  Possible Resolutions to Barriers: Family education HH follow up services recommended DME: TTB, hemi-walker     Medical Summary Current Status: Pancytopenia is stable, appetite is fair but drinks Ensure  Barriers to Discharge: Medical stability   Possible Resolutions to Barriers/Weekly Focus: Monitor blood counts, monitor for signs of infection   Continued Need for Acute Rehabilitation Level of Care: The patient requires daily medical management by a physician with specialized training in physical medicine and rehabilitation for the following reasons: Direction of a multidisciplinary physical rehabilitation program to maximize functional independence : Yes Medical management of patient stability for increased activity during participation in an intensive rehabilitation regime.: Yes Analysis of laboratory values and/or radiology reports with any subsequent need for medication adjustment and/or medical intervention. : Yes   I attest that I was present, lead the team conference, and concur with the assessment and  plan of the team.   Pamelia Hoit 07/26/2021, 2:14 PM

## 2021-07-26 NOTE — Progress Notes (Signed)
Physical Therapy Session Note  Patient Details  Name: Kurt Foley MRN: [id number] Date of Birth: [birth year]  Today's Date: 07/26/2021 PT Individual Time: 0804-0900 PT Individual Time Calculation (min): 56 min   Short Term Goals: Week 1:  PT Short Term Goal 1 (Week 1): STG = LTG due to ELOS   Skilled Therapeutic Interventions/Progress Updates:  Session 1: Patient asleep in bed on entrance to room. Slow to rouse. Time for pt to become fully alert and then agreeable to PT session.   Patient with no pain complaint throughout session.  Therapeutic Activity: Bed Mobility: Rise to EOB with MinA for moving linens, needs sup with vc for technique and extra time to bring R hip around for R foot to reach floor. VC/ tc required for technique. MaxA for doffing hand splint, MaxA for donning shoes and AFO.  Transfers: Patient performed sit<>stand and stand pivot transfers throughout session with supervision. Uses LE extensor push against seat for assist to rise. Provided verbal cues fortechnique.  Gait Training:  Patient ambulated 160 ft using HW with overall close supervision with w/c follow for fatigue and safety. Demonstrated variable step lengths bilaterally requiring consistent cueing for sequencing. Provided vc/ tc for decreasing RLE step length and increasing LLE step length, tc for LLE hip extension during stance phase.Marland Kitchen  Neuromuscular Re-ed: NMR facilitated during session with focus on standing balance, muscle activation. Pt guided in lateral stepping along EOM with stop to perform R toe touch to 5 cones placed along distance. VC for increased step length into abductionfor R hip. Guided in backward amb 10' using HW with vc/tc for knee flexion and pulling heel back, tc to reduce ER of RLE with toe down. NMR performed for improvements in motor control and coordination, balance, sequencing, judgement, and self confidence/ efficacy in performing all aspects of mobility at highest level of  independence.   Patient supine  in bed at end of session with brakes locked, bed alarm set, and all needs within reach.     Session 2: Patient seated in longsitting position in bed on entrance to room. Patient alert and agreeable to PT session.   Patient with no pain complaint throughout session.  Therapeutic Activity: Bed Mobility: Patient performed transition to sit EOB with supervision. No vc for technique required. Transfers: Patient performed sit<>stand and stand pivot transfers throughout session with supervision. Provided verbal cues for pushing from seat/ armrest with LUE.  Wheelchair Mobility:  Patient instructed in hemi technique to propel w/c. Is able to pick up technique quickly and propels to main therapy gym from room with supervision. 6 cones set up in hallway for maneuver training. Pt is able to clear all cones in turns to L side but has difficulty seeing/ clearing cones to R side of w/c Provided vc and visual mark for cone with pt continuing to need MinA to clear.   Neuromuscular Re-ed: NMR facilitated during session with focus on standing balance, gait training. Pt guided in stepping through agility ladder with focus on stepping one foot into each square. Pt requires heavy cueing for sequencing HW and stepping throughout. CGA/MinA for balance.  NMR performed for improvements in motor control and coordination, balance, sequencing, judgement, and self confidence/ efficacy in performing all aspects of mobility at highest level of independence.   Gait Training:  Patient ambulated 75 ft using HW with close supervision and close w/c follow for safety/ fatigue. Pt tends to .Provided vc/ tc for decreasing R step length and increasing L step length.  Patient seated EOB at end of session with brakes locked, bed alarm set, and all needs within reach. NT notified as to pt's positioning.   Therapy Documentation Precautions:  Precautions Precautions: Fall Precaution Comments: R  side hemi Required Braces or Orthoses: Splint/Cast Splint/Cast: aircast on R ankle, R resting hand splint (resting hand splint not in room at time of PT eval on 2/9) Restrictions Weight Bearing Restrictions: No General:   Vital Signs: Oxygen Therapy SpO2: 99 % O2 Device: Room Air Pain: Pain Assessment Pain Scale: 0-10 Pain Score: 0-No pain  Therapy/Group: Individual Therapy  Loel Dubonnet PT, DPT 07/26/2021, 10:16 AM

## 2021-07-26 NOTE — Progress Notes (Signed)
Patient ID: Kurt Foley, male   DOB: May 23, 1976, 46 y.o.   MRN: [id number]  Met with pt and spoke with his aunt-tracy via telephone to inform of team conference goals of supervision level and target discharge ate of 2/24. Both pleased with his progress and he is doing really well here. Making progress on his stroke affected side. Will continue to work toward discharge next Friday.

## 2021-07-26 NOTE — Progress Notes (Signed)
PROGRESS NOTE   Subjective/Complaints:    ROS:  Pt denies SOB, abd pain, CP, N/V/C/D, and vision changes  Objective:   No results found. Recent Labs    07/25/21 0620  WBC 2.8*  HGB 9.1*  HCT 28.6*  PLT 424*    Recent Labs    07/25/21 0620  NA 134*  K 4.3  CL 105  CO2 18*  GLUCOSE 103*  BUN 13  CREATININE 0.91  CALCIUM 10.0      Intake/Output Summary (Last 24 hours) at 07/26/2021 0732 Last data filed at 07/26/2021 0707 Gross per 24 hour  Intake 476 ml  Output 1450 ml  Net -974 ml         Physical Exam: Vital Signs Blood pressure (!) 111/93, pulse 86, temperature 97.8 F (36.6 C), resp. rate 18, height _0  (1.651 m), weight 53.7 kg, SpO2 99 %.    General: No acute distress Mood and affect are appropriate Heart: Regular rate and rhythm no rubs murmurs or extra sounds Lungs: Clear to auscultation, breathing unlabored, no rales or wheezes Abdomen: Positive bowel sounds, soft nontender to palpation, nondistended Extremities: No clubbing, cyanosis, or edema Skin: No evidence of breakdown, no evidence of rash  Ext: no clubbing, cyanosis, or edema Psych: pleasant and cooperative  Neuro:  Alert and oriented x 3. Normal insight and awareness. Intact Memory. Normal language. Speech dysarthric. Left facial weakness. RUE 0/5. RLE 3- prox to 0/5 distally. Fair PROM. Decreased sensation to LT on right Musculoskeletal: right shoulder remains tight, some pain with PROM. Low back TTP.   Assessment/Plan: 1. Functional deficits which require 3+ hours per day of interdisciplinary therapy in a comprehensive inpatient rehab setting. Physiatrist is providing close team supervision and 24 hour management of active medical problems listed below. Physiatrist and rehab team continue to assess barriers to discharge/monitor patient progress toward functional and medical goals  Care Tool:  Bathing  Bathing activity  did not occur: Refused           Bathing assist       Upper Body Dressing/Undressing Upper body dressing Upper body dressing/undressing activity did not occur (including orthotics): Refused What is the patient wearing?: Pull over shirt    Upper body assist Assist Level: Minimal Assistance - Patient > 75%    Lower Body Dressing/Undressing Lower body dressing      What is the patient wearing?: Pants     Lower body assist Assist for lower body dressing: Minimal Assistance - Patient > 75%     Toileting Toileting    Toileting assist Assist for toileting: Minimal Assistance - Patient > 75%     Transfers Chair/bed transfer  Transfers assist     Chair/bed transfer assist level: Minimal Assistance - Patient > 75%     Locomotion Ambulation   Ambulation assist      Assist level: Minimal Assistance - Patient > 75% Assistive device: Walker-hemi Max distance: 150   Walk 10 feet activity   Assist     Assist level: Minimal Assistance - Patient > 75% Assistive device: Walker-hemi   Walk 50 feet activity   Assist    Assist level: Minimal Assistance - Patient >  75% Assistive device: Walker-hemi    Walk 150 feet activity   Assist    Assist level: Minimal Assistance - Patient > 75% Assistive device: Walker-hemi    Walk 10 feet on uneven surface  activity   Assist Walk 10 feet on uneven surfaces activity did not occur: Safety/medical concerns (R hemi, fatigue)         Wheelchair     Assist Is the patient using a wheelchair?: Yes Type of Wheelchair: Manual    Wheelchair assist level: Set up assist Max wheelchair distance: 150    Wheelchair 50 feet with 2 turns activity    Assist        Assist Level: Supervision/Verbal cueing   Wheelchair 150 feet activity     Assist      Assist Level: Supervision/Verbal cueing   Blood pressure (!) 111/93, pulse 86, temperature 97.8 F (36.6 C), resp. rate 18, height _0  (1.651 m),  weight 53.7 kg, SpO2 99 %.  Medical Problem List and Plan: 1. 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  Antithrombotics: -DVT/anticoagulation:  Pharmaceutical: Lovenox             -antiplatelet therapy: ASA 3. Chronic LBP/Pain Management:   --Oxycodone 10 mg has been effective in the past-   -receiving 5-40m scheduled at night as well as prn --Lidocaine patches at nights (discomfort due to bed).  -voltaren gel for right shoulder pain 2/13- pain controlled- denies pain- con't regimen 4. Mood: LCSW to follow for evaluation and support.              -antipsychotic agents: N/A 5. Neuropsych: This patient is  capable of making decisions on his own behalf. 6. Skin/Wound Care: Routine pressure relief measures.  7. Fluids/Electrolytes/Nutrition/FTT: encourage po  -I personally reviewed the patient's labs today.    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  9. 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 Continue Biktarvy             --on Bactrim for PJP prophylaxis.  10. Pancytopenia: Neutropenia with WBC stable at 2.8K             -anemia slightly improved at 9.1       11. Insomnia: Encouraged use of Remeron and Seroquel also to help with sleep.  Increased Remeron to 152m             --pain control  2/12- sleeping well- con't regimen 12. Tachycardia: Monitor HR/BP TID- Monitor HR with activity  -90's at rest   Vitals:   07/25/21 1920 07/26/21 0232  BP: 111/89 (!) 111/93  Pulse: (!) 106 86  Resp: 20 18  Temp: 97.9 F (36.6 C) 97.8 F (36.6 C)  SpO2: 100% 99%  BPs on low side so hesistant in starting BB, asymptomatic,  may have deconditioning component, asymptomatic   14. Abnormal LFTs: Continue to hold Lipitor. Will hold motrin also for now.  -  LFTs stable, sl improved 2/9.   15. COPD: Continue MDI. No wheezing        LOS: 6 days A  FACE TO FACE EVALUATION WAS PERFORMED  Charlett Blake 07/26/2021, 7:32 AM

## 2021-07-26 NOTE — Progress Notes (Signed)
Occupational Therapy Session Note  Patient Details  Name: Kurt Foley MRN: [id number] Date of Birth: 08-18-1975  Today's Date: 07/26/2021 OT Individual Time: 1000-1045 OT Individual Time Calculation (min): 45 min    Short Term Goals: Week 1:  OT Short Term Goal 1 (Week 1): STG=LTG 2/2 ELOS  Skilled Therapeutic Interventions/Progress Updates:    Pt received in bed sleeping. He was more lethargic today and had some difficulty waking up. Pt kept saying he was fine but he was moving more slowly and slower to respond this session but he was able to participate well.   Pt able to don L shoe but needs A with R shoe and AFO and to tie shoes.  He then used hemiwalker to step towards WC with CGA.  Cues to step R foot back fully into alignment and to align foot (he rotates out) before sitting, cues to reach back with L hand.  With each transfer today pt continued to need these cues.   Pt taken to main gym to transfer to mat.  This session focused on forced use of RUE with towel slides using A/arom, B hand holds on hula hoop for bimanual integration. Today pt demonstrating increased proximal movement to retract arm further and flex elbow.  He does not have and active hand or wrist so needs A to hold onto hula hoop.  R hand wt bearing for wrist and finger stretch and activation of triceps with pushing down and then away from hand on mat.   Pt was working hard and needed several rest breaks.   Pt returned to his room and transferred back to bed to rest. Alarm on and all needs met.   Therapy Documentation Precautions:  Precautions Precautions: Fall Precaution Comments: R side hemi Required Braces or Orthoses: Splint/Cast Splint/Cast: aircast on R ankle, R resting hand splint (resting hand splint not in room at time of PT eval on 2/9) Restrictions Weight Bearing Restrictions: No    Vital Signs: Oxygen Therapy SpO2: 99 % O2 Device: Room Air Pain:   ADL: ADL Eating: Set up Grooming:  Setup Upper Body Bathing: Minimal assistance Where Assessed-Upper Body Bathing: Shower Lower Body Bathing: Minimal assistance Where Assessed-Lower Body Bathing: Shower Upper Body Dressing: Minimal assistance Lower Body Dressing: Minimal assistance Where Assessed-Lower Body Dressing: Edge of bed Toileting: Minimal assistance Where Assessed-Toileting: Glass blower/designer: Psychiatric nurse Method: Human resources officer: Not assessed Social research officer, government: Curator Method: Heritage manager: Radio broadcast assistant, Grab bars ADL Comments: Patient declined bathing at time of inital eval.   Therapy/Group: Individual Therapy  [place] 07/26/2021, 8:29 AM

## 2021-07-26 NOTE — Progress Notes (Signed)
Ok to adjust ethambutol to 800mg /day (15mg /kg) per Dr. . Dr is also aware.   Elinor Parkinson, PharmD, BCIDP, AAHIVP, CPP Infectious Disease Pharmacist 07/26/2021 12:06 PM

## 2021-07-27 NOTE — Progress Notes (Signed)
Speech Language Pathology Daily Session Note  Patient Details  Name: Kurt Foley MRN: [id number] Date of Birth: Apr 04, 1976  Today's Date: 07/27/2021 SLP Individual Time: 1345-1425 SLP Individual Time Calculation (min): 40 min  Short Term Goals: Week 1: SLP Short Term Goal 1 (Week 1): Patient will recall 2-3 speech intelligibility strategies to increase intelligibility when fatigued with minA verbal cues. SLP Short Term Goal 2 (Week 1): Patient will recall and demonstrate use of memory compensations to increase independence and safety at home with minA verbal cues. SLP Short Term Goal 3 (Week 1): Pt will participate in functional alternating attention activities with min A verbal cues SLP Short Term Goal 4 (Week 1): Pt will solve basic, home safety problems with minA verbal cues.  Skilled Therapeutic Interventions: Skilled treatment session focused on communication goals. SLP facilitated session by providing Max A verbal cues for use of external aids for recall of his speech intelligibility strategies. Patient participated in a verbal expression task at the phrase and sentence level with Min verbal cues for use of strategies. Patient appeared to demonstrate intermittent word-finding strategies and intermittent deficits in motor planning. SLP also provided education regarding importance of donning his dentures to maximize intelligibility. SLP provided adhesive for comfort. Patient left upright in bed with alarm on and all needs within reach. Continue with current plan of care.      Pain No/Denies Pain   Therapy/Group: Individual Therapy  [name] 07/27/2021, 3:04 PM

## 2021-07-27 NOTE — Progress Notes (Signed)
Physical Therapy Session Note  Patient Details  Name: Kurt Foley MRN: [id number] Date of Birth: Apr 17, 1976  Today's Date: 07/27/2021 PT Individual Time: 1300-1310 PT Individual Time Calculation (min): 10 min  and Today's Date: 07/27/2021 PT Missed Time: 50 Minutes Missed Time Reason: Patient fatigue  Short Term Goals: Week 1:  PT Short Term Goal 1 (Week 1): STG = LTG due to ELOS  Skilled Therapeutic Interventions/Progress Updates:    Patient supine in bed, asleep. PT attempting to rouse patient, but patient not able to wake up enough to participate in meaningful therapy. Will attempt later as able.   Therapy Documentation Precautions:  Precautions Precautions: Fall Precaution Comments: R side hemi Required Braces or Orthoses: Splint/Cast Splint/Cast: aircast on R ankle, R resting hand splint (resting hand splint not in room at time of PT eval on 2/9) Restrictions Weight Bearing Restrictions: No     Therapy/Group: Individual Therapy  Elizebeth Koller, PT, DPT, CBIS  07/27/2021, 7:36 AM

## 2021-07-27 NOTE — Progress Notes (Signed)
Occupational Therapy Session Note  Patient Details  Name: Kurt Foley MRN: [id number] Date of Birth: 11/23/1975  Today's Date: 07/27/2021 OT Individual Time: 0940-7680 OT Individual Time Calculation (min): 60 min    Short Term Goals: Week 1:  OT Short Term Goal 1 (Week 1): STG=LTG 2/2 ELOS  Skilled Therapeutic Interventions/Progress Updates:    Pt received in bed ready for therapy. Pt used hemiwalker to transfer to wc with cues to step R foot back fully to w/c. Pt needed a cue to reach back for wc.  Each subsequent transfer he improved his foot positioning and ability to reach back.   Pt taken to gym to work in sitting and supine on forced use of RUE with a variety of interventions. Pt is now lifting his arm away from his body to 30 degrees, reaching arm out using his triceps more effectively. He is slowly gaining proximal control. He continues to have increased tone so spent a lot of time working on muscle engagement without over tensing muscles.  Pt participated well. Returned to room. Pt resting in bed with alarm set and all needs met.   Therapy Documentation Precautions:  Precautions Precautions: Fall Precaution Comments: R side hemi Required Braces or Orthoses: Splint/Cast Splint/Cast: aircast on R ankle, R resting hand splint (resting hand splint not in room at time of PT eval on 2/9) Restrictions Weight Bearing Restrictions: No    Vital Signs: Therapy Vitals Temp: 98.9 F (37.2 C) Temp Source: Oral Pulse Rate: (!) 103 Resp: 20 BP: 106/84 Patient Position (if appropriate): Lying Oxygen Therapy SpO2: 99 % O2 Device: Room Air Pain: Pain Assessment Pain Score: 5  ADL: ADL Eating: Set up Grooming: Setup Upper Body Bathing: Minimal assistance Where Assessed-Upper Body Bathing: Shower Lower Body Bathing: Minimal assistance Where Assessed-Lower Body Bathing: Shower Upper Body Dressing: Minimal assistance Lower Body Dressing: Minimal assistance Where  Assessed-Lower Body Dressing: Edge of bed Toileting: Minimal assistance Where Assessed-Toileting: Glass blower/designer: Psychiatric nurse Method: Human resources officer: Not assessed Social research officer, government: Curator Method: Heritage manager: Radio broadcast assistant, Grab bars ADL Comments: Patient declined bathing at time of inital eval.    Therapy/Group: Individual Therapy  Ailen Strauch 07/27/2021, 1:09 PM

## 2021-07-27 NOTE — Progress Notes (Signed)
Nutrition Follow-up  DOCUMENTATION CODES:   Non-severe (moderate) malnutrition in context of chronic illness  INTERVENTION:  - Continue Ensure Enlive po TID, each supplement provides 350 kcal and 20 grams of protein.  - Continue MVI   - Encourage adequate PO and supplement intake     NUTRITION DIAGNOSIS:   Moderate Malnutrition related to chronic illness (HIV/AIDS) as evidenced by moderate fat depletion, moderate muscle depletion.  Ongoing   GOAL:   Patient will meet greater than or equal to 90% of their needs  Progressing - addressing via PO intake and nutritional supplements   MONITOR:   PO intake, Supplement acceptance, Weight trends, Labs  REASON FOR ASSESSMENT:   Malnutrition Screening Tool    ASSESSMENT:   Pt is a 46 y.o. 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 Pt recently admitted to inpatient rehab (CIR) for functional decline due to MAC/PML in the setting of AIDS.  Upon entering room, pt resting in bed, appears alert and oriented. Per pt, pt states that his appetite has been good today and over the last few days. Pt reports that he only ate 25% of his sausage and pancakes for breakfast today, along with some juice. Pt reports that for lunch today he is having spaghetti and states that he likes spaghetti. Pt denies any chewing or swallowing difficulties. Pt also denies any nausea or constipation at this time. Per pt, he has been drinking the Ensure supplements and states that he drank two Ensure supplements yesterday.   Per meal documentation, pt consumed 85-95% of meals yesterday on 07/26/21 and 100% of meals on 07/25/2021.   Encouraged pt to continue eating  consistent meals throughout the day and drinking nutritional supplements to aid in caloric and protein intake. Dietetic intern to continue current nutrition plan as meal documentation shows that pt has been consuming most meals and drinking Ensure supplement drink.   Weight history reviewed.   Medications and labs reviewed.    Diet Order:   Diet Order             DIET DYS 3 Room service appropriate? Yes; Fluid consistency: Thin  Diet effective now                   EDUCATION NEEDS:   Education needs have been addressed  Skin:  Skin Assessment: Reviewed RN Assessment  Last BM:  2/15  Height:   Ht Readings from Last 1 Encounters:  07/20/21 _0  (1.651 m)    Weight:   Wt Readings from Last 1 Encounters:  07/20/21 53.7 kg    Ideal Body Weight:  61.8 kg  BMI:  Body mass index is 19.7 kg/m.  Estimated Nutritional Needs:   Kcal:  1750-1950  Protein:  90 - 105 grams  Fluid:  >/= 1.8 L    [name], Dietetic Intern 07/27/2021 12:42 PM

## 2021-07-27 NOTE — Progress Notes (Signed)
Physical Therapy Session Note  Patient Details  Name: Kurt Foley MRN: [id number] Date of Birth: 08-28-1975  Today's Date: 07/27/2021 PT Individual Time: 1300-1330 PT Individual Time Calculation (min): 30 min   Short Term Goals: Week 1:  PT Short Term Goal 1 (Week 1): STG = LTG due to ELOS  Skilled Therapeutic Interventions/Progress Updates:      Pt supine in bed sleeping to start session. Agreeable to PT tx. Denies pain, reports he's having an "awesome" day. Pt with his shoes and R AFO already on.   Supine<>sit mod I. Sit<>stand to [facility] with CGA from EOB. Ambulated from his room to main rehab gym, ~170ft, with CGA and HW - worked on progressing from step-to gait to step-through which improved during session. Provided heel wedge to his R shoe to reduce genu recurvatum is midstance - improved knee stability in stance with addition of heel wedge.   Completed standing heel raises to target ankle PF to assist in managing knee hyperextension. Completed 2x15 using tabletop for UE support. Unable to complete full ROM on R ankle due to weakness and bracing but partial ROM seen.  Ambulated back to his room with CGA and HW, 162ft with improved step-through gait pattern.   Remained seated EOB with bed alarm on, all needs in reach. Informed of upcoming therapy schedule as pt was disappointed of only 30 minute treatment session.  Therapy Documentation Precautions:  Precautions Precautions: Fall Precaution Comments: R side hemi Required Braces or Orthoses: Splint/Cast Splint/Cast: aircast on R ankle, R resting hand splint (resting hand splint not in room at time of PT eval on 2/9) Restrictions Weight Bearing Restrictions: No General:    Therapy/Group: Individual Therapy  Orrin Brigham 07/27/2021, 7:58 AM

## 2021-07-27 NOTE — Progress Notes (Signed)
PROGRESS NOTE   Subjective/Complaints:  Patient without new complaints, participating very well in physical therapy.  Discussed discharge date.  Also discussed elevated heart rate.  Patient states that he was on some medicine for this in the past.  Also discussed his prior stroke which she states was 6 months ago, hospitalized at [place]:  Pt denies SOB, abd pain, CP, N/V/C/D, and vision changes  Objective:   No results found. Recent Labs    07/25/21 0620  WBC 2.8*  HGB 9.1*  HCT 28.6*  PLT 424*    Recent Labs    07/25/21 0620  NA 134*  K 4.3  CL 105  CO2 18*  GLUCOSE 103*  BUN 13  CREATININE 0.91  CALCIUM 10.0      Intake/Output Summary (Last 24 hours) at 07/27/2021 8341 Last data filed at 07/27/2021 0841 Gross per 24 hour  Intake 960 ml  Output 1500 ml  Net -540 ml         Physical Exam: Vital Signs Blood pressure 103/77, pulse (!) 117, temperature 98.6 F (37 C), temperature source Oral, resp. rate 20, height _0  (1.651 m), weight 53.7 kg, SpO2 99 %.    General: No acute distress Mood and affect are appropriate Heart: Regular rate and rhythm no rubs murmurs or extra sounds Lungs: Clear to auscultation, breathing unlabored, no rales or wheezes Abdomen: Positive bowel sounds, soft nontender to palpation, nondistended Extremities: No clubbing, cyanosis, or edema Skin: No evidence of breakdown, no evidence of rash  Ext: no clubbing, cyanosis, or edema Psych: pleasant and cooperative  Neuro:  Alert and oriented x 3. Normal insight and awareness. Intact Memory. Normal language. Speech dysarthric. Left facial weakness. RUE 0/5. RLE 3- prox to 0/5 distally. Fair PROM. Decreased sensation to LT on right Musculoskeletal: right shoulder remains tight, some pain with PROM. Low back TTP.   Assessment/Plan: 1. Functional deficits which require 3+ hours per day of interdisciplinary therapy in a  comprehensive inpatient rehab setting. Physiatrist is providing close team supervision and 24 hour management of active medical problems listed below. Physiatrist and rehab team continue to assess barriers to discharge/monitor patient progress toward functional and medical goals  Care Tool:  Bathing  Bathing activity did not occur: Refused           Bathing assist       Upper Body Dressing/Undressing Upper body dressing Upper body dressing/undressing activity did not occur (including orthotics): Refused What is the patient wearing?: Pull over shirt    Upper body assist Assist Level: Minimal Assistance - Patient > 75%    Lower Body Dressing/Undressing Lower body dressing      What is the patient wearing?: Pants     Lower body assist Assist for lower body dressing: Minimal Assistance - Patient > 75%     Toileting Toileting    Toileting assist Assist for toileting: Minimal Assistance - Patient > 75%     Transfers Chair/bed transfer  Transfers assist     Chair/bed transfer assist level: Minimal Assistance - Patient > 75%     Locomotion Ambulation   Ambulation assist      Assist level: Minimal Assistance -  Patient > 75% Assistive device: Walker-hemi Max distance: 150   Walk 10 feet activity   Assist     Assist level: Minimal Assistance - Patient > 75% Assistive device: Walker-hemi   Walk 50 feet activity   Assist    Assist level: Minimal Assistance - Patient > 75% Assistive device: Walker-hemi    Walk 150 feet activity   Assist    Assist level: Minimal Assistance - Patient > 75% Assistive device: Walker-hemi    Walk 10 feet on uneven surface  activity   Assist Walk 10 feet on uneven surfaces activity did not occur: Safety/medical concerns (R hemi, fatigue)         Wheelchair     Assist Is the patient using a wheelchair?: Yes Type of Wheelchair: Manual    Wheelchair assist level: Set up assist Max wheelchair distance:  150    Wheelchair 50 feet with 2 turns activity    Assist        Assist Level: Supervision/Verbal cueing   Wheelchair 150 feet activity     Assist      Assist Level: Supervision/Verbal cueing   Blood pressure 103/77, pulse (!) 117, temperature 98.6 F (37 C), temperature source Oral, resp. rate 20, height _0  (1.651 m), weight 53.7 kg, SpO2 99 %.  Medical Problem List and Plan: 1. 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  Antithrombotics: -DVT/anticoagulation:  Pharmaceutical: Lovenox             -antiplatelet therapy: ASA 3. Chronic LBP/Pain Management:   --Oxycodone 10 mg has been effective in the past-   -receiving 5-45m scheduled at night as well as prn --Lidocaine patches at nights (discomfort due to bed).  -voltaren gel for right shoulder pain 2/13- pain controlled- denies pain- con't regimen 4. Mood: LCSW to follow for evaluation and support.              -antipsychotic agents: N/A 5. Neuropsych: This patient is  capable of making decisions on his own behalf. 6. Skin/Wound Care: Routine pressure relief measures.  7. Fluids/Electrolytes/Nutrition/FTT: encourage po  -I personally reviewed the patient's labs today.    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  9. 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 Continue Biktarvy             --on Bactrim for PJP prophylaxis.  10. Pancytopenia: Neutropenia with WBC stable at 2.8K             -anemia slightly improved at 9.1       11. Insomnia: Encouraged use of Remeron and Seroquel also to help with sleep.  Increased Remeron to 190m             --pain  control  2/12- sleeping well- con't regimen 12. Tachycardia: Monitor HR/BP TID- Monitor HR with activity  -90's at rest   Vitals:   07/27/21 0534 07/27/21 0832  BP: 98/76 103/77  Pulse: (!) 120 (!) 117  Resp: 20 20  Temp: 98.1 F (36.7 C) 98.6 F (37 C)  SpO2: 99% 99%  BPs on  low side so hesistant in starting BB, asymptomatic, may have deconditioning component, asymptomatic   14. Abnormal LFTs: Continue to hold Lipitor. Will hold motrin also for now.  -  LFTs stable, sl improved 2/9.   15. COPD: Continue MDI. No wheezing        LOS: 7 days A FACE TO FACE EVALUATION WAS PERFORMED  Charlett Blake 07/27/2021, 9:28 AM

## 2021-07-27 NOTE — Progress Notes (Signed)
Physical Therapy Session Note  Patient Details  Name: Kurt Foley MRN: [id number] Date of Birth: Aug 31, 1975  Today's Date: 07/27/2021 PT Individual Time: 7106-2694 PT Individual Time Calculation (min): 40 min   Short Term Goals: Week 1:  PT Short Term Goal 1 (Week 1): STG = LTG due to ELOS  Skilled Therapeutic Interventions/Progress Updates:   Received pt supine in bed, pt agreeable to PT treatment, and denied any pain during session. Session with emphasis on functional mobility/transfers, generalized strengthening and endurance, dynamic standing balance/coordination, and gait training. Pt transferred supine<>sitting EOB from flat bed with supervision and donned shoes and R AFO with total A. Pt performed all transfers with [facility] and CGA throughout session. Pt transported to/from room in [facility] total A for time management purposes and ambulated 160ft with HW and CGA - noted R knee hyperextension and variable step lengths on R side but no LOB. Cues to relax RUE for improved arm swing but pt reported being "too tight" to do so. Pt then performed BLE and LUE strengthening on Nustep at workload 3 for 8 minutes for a total of 184 steps with emphasis on cardiovascular endurance and reciprocal movement training - caution to avoid excessive R hip abduction but with verbal and visual cues pt able to keep RLE in midline (pt REALLY enjoyed the Nustep). Stand<>pivot Nustep<>WC with HW and CGA and from WC<>recliner with HW and CGA. MD arrived for morning rounds and concluded session with pt sitting in recliner, needs within reach, and seatbelt alarm on. Provided pt with fresh drink.   Therapy Documentation Precautions:  Precautions Precautions: Fall Precaution Comments: R side hemi Required Braces or Orthoses: Splint/Cast Splint/Cast: aircast on R ankle, R resting hand splint (resting hand splint not in room at time of PT eval on 2/9) Restrictions Weight Bearing Restrictions: No  Therapy/Group: Individual  Therapy Martin Majestic PT, DPT   07/27/2021, 7:27 AM

## 2021-07-28 ENCOUNTER — Inpatient Hospital Stay: Payer: Self-pay | Admitting: Internal Medicine

## 2021-07-28 NOTE — Progress Notes (Signed)
Occupational Therapy Session Note  Patient Details  Name: Kurt Foley MRN: [id number] Date of Birth: [birth year]  Today's Date: 07/28/2021 OT Group Time: 1030-1130 OT Group Time Calculation (min): 60 min   Short Term Goals: Week 1:  OT Short Term Goal 1 (Week 1): STG=LTG 2/2 ELOS  Skilled Therapeutic Interventions/Progress Updates:  Pt participated in group session with a focus on community re-entry, energy conservation strategies ( ECS) and safe home set- up for DC. Pt reports he wants to be able to take a shower at home and wants to be able to go to [employer]. Discussed safe recommendations for navigating various surface terrains as pt currently using hemiwalker for functional mobility. Discussed energy conservation strategies in relation to community re-entry such as always having a plan, pacing yourself, using handicap placard, and how to navigate community toileting . Education also provided on ECS for navigating stores etc such as using motorized cart when necessary, being aware of various obstacles in stores,  and delegating shopping tasks to family members as appropriate. Pt quiet during session but was noted to nod during session and make sporafic comments. Issued pt educational handouts to increase carryover about ECS and community re-entry. Pt transported back to room by NT.  Therapy Documentation Precautions:  Precautions Precautions: Fall Precaution Comments: R side hemi Required Braces or Orthoses: Splint/Cast Splint/Cast: aircast on R ankle, R resting hand splint (resting hand splint not in room at time of PT eval on 2/9) Restrictions Weight Bearing Restrictions: No   Pain: no pain reported during session     Therapy/Group: Group Therapy  Barron Schmid 07/28/2021, 12:21 PM

## 2021-07-28 NOTE — Progress Notes (Signed)
Occupational Therapy Session Note  Patient Details  Name: Kurt Foley MRN: [id number] Date of Birth: 30-Oct-1975  Today's Date: 07/28/2021 OT Individual Time: 1005-1035 OT Individual Time Calculation (min): 30 min    Short Term Goals: Week 1:  OT Short Term Goal 1 (Week 1): STG=LTG 2/2 ELOS  Skilled Therapeutic Interventions/Progress Updates:  Patient met lying supine in bed in agreement with OT treatment session. 0/10 pain reported at rest and with activity. Functional mobility to ortho gym with use of hemi walker and CGA. Session with focus on RUE/RLE NMR. In sitting patient completed RLE marches, straight leg raises and long arc quads for 2 sets x10 reps each with 4lb ankle weight. Seated EOB, patient with mild proximal movement palpated with shoulder shrugs and scapular retraction/protraction. PROM at elbow, wrist and hand still limited by increased tone. In supine on mat table patient able to complete chest press with 3lb weighted dowel and hand over hand assist to maintain position of R hand for 2 sets x5 reps each. Session concluded with handoff to PT in prep for next session.   Therapy Documentation Precautions:  Precautions Precautions: Fall Precaution Comments: R side hemi Required Braces or Orthoses: Splint/Cast Splint/Cast: aircast on R ankle, R resting hand splint (resting hand splint not in room at time of PT eval on 2/9) Restrictions Weight Bearing Restrictions: No General:   Therapy/Group: Individual Therapy  Kurt Foley 07/28/2021, 7:52 AM

## 2021-07-28 NOTE — Progress Notes (Signed)
Occupational Therapy Session Note  Patient Details  Name: Kurt Foley MRN: [id number] Date of Birth: [birth year]  Today's Date: 07/28/2021 OT Individual Time: 1005-1035 OT Individual Time Calculation (min): 30 min    Short Term Goals: Week 1:  OT Short Term Goal 1 (Week 1): STG=LTG 2/2 ELOS  Skilled Therapeutic Interventions/Progress Updates:    Pt received in bed sleeping but awoke easily. Recommended pt shower today and change his clothing. Initially pt was resistant saying he was not "dirty".  Told pt he should at least shower as it has been several days.  He agreed to put on a different shirt but wanted to keep the same pants as the other ones were too "heavy".   Pt doffed his socks and then ambulated with CGA using hemiwalker to toilet. Pt toileted with supervision then transferred to shower bench.  Bathed with CGA in standing.  Pt got frustrated with this therapist when I was cuing him on how to stand safely in shower and with positioning of R leg and then he got more frustrated when I asked him to try to lift his R arm slightly. "I cant do it!" "I have already tried".  Reminded pt that we have been working on R arm exercises daily and I observed him being able to lift his arm slightly.  Pt got frustrated and explained to him that we want to see that carryover into function.  Explained that everyday we are presenting him with challenges to help him be as independent and functional as possible.  Pt seemed to understand. He completed dressing with cues for hemidressing techniques. Did not have great carryover with what he was taught in previous sessions.  Will need continued training.   Pt taken to the OT group for his next session.   Therapy Documentation Precautions:  Precautions Precautions: Fall Precaution Comments: R side hemi Required Braces or Orthoses: Splint/Cast Splint/Cast: aircast on R ankle, R resting hand splint (resting hand splint not in room at time of PT eval on  2/9) Restrictions Weight Bearing Restrictions: No Pain: Pain Assessment Pain Score: 0-No pain ADL: ADL Eating: Set up Grooming: Setup Upper Body Bathing: Minimal assistance Where Assessed-Upper Body Bathing: Shower Lower Body Bathing: Minimal assistance Where Assessed-Lower Body Bathing: Shower Upper Body Dressing: Minimal assistance Lower Body Dressing: Minimal assistance Where Assessed-Lower Body Dressing: Edge of bed Toileting: Minimal assistance Where Assessed-Toileting: Teacher, adult education: Curator Method: Insurance claims handler: Not assessed Film/video editor: Administrator, arts Method: Designer, industrial/product: Emergency planning/management officer, Grab bars ADL Comments: Patient declined bathing at time of inital eval.   Therapy/Group: Individual Therapy  Lowen Barringer 07/28/2021, 12:58 PM

## 2021-07-28 NOTE — Progress Notes (Signed)
Speech Language Pathology Weekly Progress and Session Note  Patient Details  Name: Kurt Foley MRN: [id number] Date of Birth: 10/04/1975  Beginning of progress report period: July 21, 2021 End of progress report period: July 28, 2021  Today's Date: 07/28/2021 SLP Individual Time: 1500-1530 SLP Individual Time Calculation (min): 30 min  Short Term Goals: Week 1: SLP Short Term Goal 1 (Week 1): Patient will recall 2-3 speech intelligibility strategies to increase intelligibility when fatigued with minA verbal cues. SLP Short Term Goal 1 - Progress (Week 1): Met SLP Short Term Goal 2 (Week 1): Patient will recall and demonstrate use of memory compensations to increase independence and safety at home with minA verbal cues. SLP Short Term Goal 2 - Progress (Week 1): Met SLP Short Term Goal 3 (Week 1): Pt will participate in functional alternating attention activities with min A verbal cues SLP Short Term Goal 3 - Progress (Week 1): Progressing toward goal SLP Short Term Goal 4 (Week 1): Pt will solve basic, home safety problems with minA verbal cues. SLP Short Term Goal 4 - Progress (Week 1): Met    New Short Term Goals: Week 2: SLP Short Term Goal 1 (Week 2): STG=LTG due to ELOS  Weekly Progress Updates: Patient has made functional gains and has met 3 of 4 STGs this reporting period. Currently, pt requires overall min A for cognitive tasks in regards to functional problem solving, safety awareness, recall using compensatory aids, and min-to-mod A for alternating attention. Patient benefits from extended processing time and reminders to refer to external aids to support short-term recall and use of speech intelligibility strategies. Patient has demonstrated ability to implement speech strategies at the phrase and sentence level with overall sup-to-min A verbal cues in order to enhance intelligibility. Per discussion with patient and mother, patient is near cognitive baseline,  however him and his mother both endorse mild acute processing and speech deficits and pt wishes to continue speech therapy services at this time to improve his "thinking." Goals were downgraded slightly to sup-to-min A level to account for baseline deficits per further discussion with patient's mother. Patient and family education is ongoing. Patient would benefit from continued skilled SLP intervention to maximize cognitive and speech functioning and overall functional independence prior to discharge.   Intensity: Minumum of 1-2 x/day, 30 to 90 minutes Frequency: 3 to 5 out of 7 days Duration/Length of Stay: 2/24 Treatment/Interventions: Cognitive remediation/compensation;Internal/external aids;Environmental controls;Speech/Language facilitation;Therapeutic Activities;Cueing hierarchy;Functional tasks;Patient/family education;Therapeutic Exercise   Daily Session  Skilled Therapeutic Interventions: Skilled ST treatment focused on cognitive and speech goals. SLP facilitated session by providing overall min A cues to use external compensatory aid to recall speech intelligibility strategies. Patient participated in a verbal expression task at the phrase and sentence level with sup A verbal cues for implementation of speech intelligibility strategies to over-articulate and pause between words in order to achieve >80% intelligibility. Pt's mother was present during session and reported she was able to understand patient well over the phone recently and is very pleased with speech progress. Pt benefited from min-to-mod A verbal cues for alternating attention between various points throughout session, with overall sup A cues to refer to external memory aid. Per discussion with patient and his mother, it appears patient is near baseline with the exception of decreased speech intelligibility and slower processing. Pt expressed he does not particularly enjoy speech therapy because "it's hard" however wishes to  continue to address speech and "get quicker to think" with current frequency of  1-3x/week. Patient was left in bed with alarm activated and immediate needs within reach at end of session. Continue per current plan of care.     General    Pain    Therapy/Group: Individual Therapy  Patty Sermons 07/28/2021, 5:55 PM

## 2021-07-28 NOTE — Progress Notes (Signed)
PROGRESS NOTE   Subjective/Complaints:    ROS:  Pt denies SOB, abd pain, CP, N/V/C/D, and vision changes  Objective:   No results found. No results for input(s): WBC, HGB, HCT, PLT in the last 72 hours.  No results for input(s): NA, K, CL, CO2, GLUCOSE, BUN, CREATININE, CALCIUM in the last 72 hours.    Intake/Output Summary (Last 24 hours) at 07/28/2021 0738 Last data filed at 07/28/2021 0631 Gross per 24 hour  Intake 600 ml  Output 1350 ml  Net -750 ml         Physical Exam: Vital Signs Blood pressure 104/79, pulse (!) 121, temperature 98.1 F (36.7 C), temperature source Oral, resp. rate 16, height _0  (1.651 m), weight 53.7 kg, SpO2 97 %.    General: No acute distress Mood and affect are appropriate Heart: Regular rate and rhythm no rubs murmurs or extra sounds Lungs: Clear to auscultation, breathing unlabored, no rales or wheezes Abdomen: Positive bowel sounds, soft nontender to palpation, nondistended Extremities: No clubbing, cyanosis, or edema Skin: No evidence of breakdown, no evidence of rash  Ext: no clubbing, cyanosis, or edema Psych: pleasant and cooperative  Neuro:  Alert and oriented x 3. Normal insight and awareness. Intact Memory. Normal language. Speech dysarthric. Left facial weakness. RUE 0/5. RLE 3- prox to 0/5 distally. Fair PROM. Decreased sensation to LT on right Musculoskeletal: right shoulder remains tight, some pain with PROM. Low back TTP.   Assessment/Plan: 1. Functional deficits which require 3+ hours per day of interdisciplinary therapy in a comprehensive inpatient rehab setting. Physiatrist is providing close team supervision and 24 hour management of active medical problems listed below. Physiatrist and rehab team continue to assess barriers to discharge/monitor patient progress toward functional and medical goals  Care Tool:  Bathing  Bathing activity did not occur:  Refused           Bathing assist       Upper Body Dressing/Undressing Upper body dressing Upper body dressing/undressing activity did not occur (including orthotics): Refused What is the patient wearing?: Pull over shirt    Upper body assist Assist Level: Minimal Assistance - Patient > 75%    Lower Body Dressing/Undressing Lower body dressing      What is the patient wearing?: Pants     Lower body assist Assist for lower body dressing: Minimal Assistance - Patient > 75%     Toileting Toileting    Toileting assist Assist for toileting: Minimal Assistance - Patient > 75%     Transfers Chair/bed transfer  Transfers assist     Chair/bed transfer assist level: Contact Guard/Touching assist     Locomotion Ambulation   Ambulation assist      Assist level: Contact Guard/Touching assist Assistive device: Walker-hemi Max distance: 156f   Walk 10 feet activity   Assist     Assist level: Contact Guard/Touching assist Assistive device: Walker-hemi   Walk 50 feet activity   Assist    Assist level: Contact Guard/Touching assist Assistive device: Walker-hemi    Walk 150 feet activity   Assist    Assist level: Contact Guard/Touching assist Assistive device: Walker-hemi    Walk 10 feet on  uneven surface  activity   Assist Walk 10 feet on uneven surfaces activity did not occur: Safety/medical concerns (R hemi, fatigue)         Wheelchair     Assist Is the patient using a wheelchair?: Yes Type of Wheelchair: Manual    Wheelchair assist level: Set up assist Max wheelchair distance: 150    Wheelchair 50 feet with 2 turns activity    Assist        Assist Level: Supervision/Verbal cueing   Wheelchair 150 feet activity     Assist      Assist Level: Supervision/Verbal cueing   Blood pressure 104/79, pulse (!) 121, temperature 98.1 F (36.7 C), temperature source Oral, resp. rate 16, height _0  (1.651 m), weight 53.7  kg, SpO2 97 %.  Medical Problem List and Plan: 1. 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  Antithrombotics: -DVT/anticoagulation:  Pharmaceutical: Lovenox             -antiplatelet therapy: ASA 3. Chronic LBP/Pain Management:   --Oxycodone 10 mg has been effective in the past-   -receiving 5-76m scheduled at night as well as prn --Lidocaine patches at nights (discomfort due to bed).  -voltaren gel for right shoulder pain 2/14- pain controlled- denies pain- con't regimen 4. Mood: LCSW to follow for evaluation and support.              -antipsychotic agents: N/A 5. Neuropsych: This patient is  capable of making decisions on his own behalf. 6. Skin/Wound Care: Routine pressure relief measures.  7. Fluids/Electrolytes/Nutrition/FTT: encourage po  -I personally reviewed the patient's labs today.    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  9. 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 Continue Biktarvy             --on Bactrim for PJP prophylaxis.  10. Pancytopenia: Neutropenia with WBC stable at 2.8K             -anemia slightly improved at 9.1       11. Insomnia: Encouraged use of Remeron and Seroquel also to help with sleep.  Increased Remeron to 116m             --pain control  2/12- sleeping well- con't regimen 12. Tachycardia: Monitor HR/BP TID- Monitor HR with activity, reg rythmn but will check EKG     Vitals:   07/27/21 2358 07/28/21 0400  BP: 101/83 104/79  Pulse: (!) 120 (!) 121  Resp: 16   Temp: 98.6 F (37 C) 98.1 F (36.7 C)  SpO2: 97% 97%  BPs on low side so hesistant in starting BB, asymptomatic, may  have deconditioning component, asymptomatic   14. Abnormal LFTs: Continue to hold Lipitor. Will hold motrin also for now.  -  LFTs stable, sl improved 2/9.   15. COPD: Continue MDI. No wheezing        LOS: 8 days A FACE TO FACE EVALUATION WAS PERFORMED  AnCharlett Blake/16/2023, 7:38 AM

## 2021-07-28 NOTE — Progress Notes (Signed)
Physical Therapy Weekly Progress Note  Patient Details  Name: Kurt Foley MRN: [id number] Date of Birth: 1975-08-22  Beginning of progress report period: July 21, 2021 End of progress report period: July 28, 2021  Today's Date: 07/28/2021 PT Individual Time: 8102-5486 PT Individual Time Calculation (min): 53 min   Patient has met 0 of 1 short term goals.  Longer than expected LOS. Patient is making good progress toward his LTG. He remains grossly CGA and ambulating limited community distances with HW and AFO.   Patient continues to demonstrate the following deficits muscle weakness, decreased cardiorespiratoy endurance, decreased motor planning, decreased awareness, decreased problem solving, and decreased safety awareness, and decreased sitting balance, decreased standing balance, decreased postural control, hemiplegia, and decreased balance strategies and therefore will continue to benefit from skilled PT intervention to increase functional independence with mobility.  Patient progressing toward long term goals..  Continue plan of care.  PT Short Term Goals Week 1:  PT Short Term Goal 1 (Week 1): STG = LTG due to ELOS PT Short Term Goal 1 - Progress (Week 1): Progressing toward goal Week 2:  PT Short Term Goal 1 (Week 2): STG=LTG based on ELOS  Skilled Therapeutic Interventions/Progress Updates:    Patient received ambulating in room with previous PT, handoff from PT, agreeable to PT. He denies pain. PT transporting patient in wc to therapy gym for time management and energy conservation. NMES applied to R ant tib for improved AROM into dorsiflexion. Patient ambulating ~165f to dayroom with HW and CGA. Improving reciprocal stepping pattern with variable step length. NuStep x12 mins completed on level 2. Patient ambulating ~1546fback to therapy gym with HW and light CGA/ supervision. Returning to room in wc. Ambulatory transfer to bed. Remaining edge of bed, bed alarm on, call  light within reach, family at bedside.   Therapy Documentation Precautions:  Precautions Precautions: Fall Precaution Comments: R side hemi Required Braces or Orthoses: Splint/Cast Splint/Cast: aircast on R ankle, R resting hand splint (resting hand splint not in room at time of PT eval on 2/9) Restrictions Weight Bearing Restrictions: No  Therapy/Group: Individual Therapy  JeDebbora Dus/16/2023, 7:34 AM

## 2021-07-28 NOTE — Evaluation (Signed)
Recreational Therapy Assessment and Plan  Patient Details  Name: Kurt Foley MRN: [id number] Date of Birth: 11-02-1975 Today's Date: 07/28/2021  Rehab Potential:  Good ELOS:   d/c 2/24  Assessment Hospital Problem: Principal Problem:   Debility     Past Medical History:      Past Medical History:  Diagnosis Date   Depression     HIV infection ([place])      Past Surgical History: History reviewed. No pertinent surgical history.   Assessment & Plan Clinical Impression: Patient is a 46 y.o. year old male with history of CVA with residual R-HP, COPD, AIDS, disseminated MAC with brain biospy revealing JC virus/PML during recent stay at [facility] 1/11- 1/24 for IRIS in setting of PMA w/ anorexia and balance deficits. He was readmitted to [facility] on 07/13/21 with FTT and difficulty for caring of him at home (due to lack of SNF benefits).  Dr. Gale Journey consulted for input and recommended continuing MAC treatment with ophthalmology consult for visual disturbance as well as question of brain bx/LP for further work up. Patient with inability to recover CD4 count with gap in ART and hope for recovery with resumption of Biktarvy since 02/2021. Eye exam by Dr. Monica Martinez was negative for retinitis, vitritis, uveitis, opportunistic infection or ethambutol  toxicity. Palliative care consulted due to high mortality/poor prognosis with PML diagnosis and mother/aunts elected on full scope of treatment with limited code--limited code and rehab for ongoing weakness.    LP was negative organism and no fungal growth so far. Low grade fevers worked up with BC/UC which was negative. Abdominal ultrasound repeated due to abnormal LFTs and statin held as ultrasound without acute findings. Megace added to help with intake and electrolyte abnormalities have resolved. Ensure added for nutritional supplement. He continues to be limited by weakness with unsteady gait and ataxia. CIR recommended due to recent functional decline. Currently  complains of right sided shoulder pain.    Pt presents with decreased activity tolerance, decreased functional mobility, decreased balance, decreased coordination, decreased safety awareness, decreased memory, and delayed processing Limiting pt's independence with leisure/community pursuits.    Plan  Min 1 TR session >30 minutes during LOS  Recommendations for other services: None   Discharge Criteria: Patient will be discharged from TR if patient refuses treatment 3 consecutive times without medical reason.  If treatment goals not met, if there is a change in medical status, if patient makes no progress towards goals or if patient is discharged from hospital.  The above assessment, treatment plan, treatment alternatives and goals were discussed and mutually agreed upon: by patient  Session note: No c/o pain  Met with pt today during group session to provide education on activity analysis/modifications, home safety/management, energy conservation and community reintegration.  Pt pleasant and participatory in discussion.  Handouts provided. Onset 07/28/2021, 3:56 PM

## 2021-07-28 NOTE — Progress Notes (Signed)
Physical Therapy Session Note  Patient Details  Name: Kurt Foley MRN: [id number] Date of Birth: [birth year]  Today's Date: 07/28/2021 PT Individual Time: 1330-1405 PT Individual Time Calculation (min): 35 min   Short Term Goals: Week 1:  PT Short Term Goal 1 (Week 1): STG = LTG due to ELOS PT Short Term Goal 1 - Progress (Week 1): Progressing toward goal Week 2:  PT Short Term Goal 1 (Week 2): STG=LTG based on ELOS  Skilled Therapeutic Interventions/Progress Updates:   Denies pain Pt received as handoff from OT supine on mat in ortho gym.  Supine to prone w/assist to manage/position RUE w/transition Prone hamstring curls x 10 Prone to quadruped to tall kneeling w/mod assist Sustained tall kneeling w/cga Tall kneeling to sitting on haunches repeated x 10 for glut/core stendthing Tall kneeling to long sit w/min assist  Hs stretching in long sit.  Functional strengthening: Sit to stand + sidestep + stand to sit repeated 10X in each direction standing in front of mat.  Cues to maintain midline, wtshift onto RLE equally w/transition, decreased step length and placement control traveling to R.  Sidelying R hip clamshells 2x5, 1x10 w/improved activation w/repeated efrforts.  stand pivot transfer to wc w/close supervision, cues to wb onto RLE w/transition.  Transported to room d/t need for BM. Short distance gait to commode w/hemiwalker, SFO, cga Commode transfer w/cga w/hemiwalker, cues for midline w/stand to sit.  Manages clothing w/supervision. Pt continent of bowels, independent w/hygiene. Short distance gait to wc w/hemiwalker.  Pt handed off to next therapist for PT session.    Therapy Documentation Precautions:  Precautions Precautions: Fall Precaution Comments: R side hemi Required Braces or Orthoses: Splint/Cast Splint/Cast: aircast on R ankle, R resting hand splint (resting hand splint not in room at time of PT eval on 2/9) Restrictions Weight Bearing  Restrictions: No    Therapy/Group: Individual Therapy Rada Hay, PT   Shearon Balo 07/28/2021, 2:09 PM

## 2021-07-28 NOTE — Plan of Care (Signed)
°  Problem: RH Problem Solving Goal: LTG Patient will demonstrate problem solving for (SLP) Description: LTG:  Patient will demonstrate problem solving for basic/complex daily situations with cues  (SLP) Flowsheets (Taken 07/28/2021 1758) LTG Patient will demonstrate problem solving for:  Supervision  Minimal Assistance - Patient > 75% Note: Goal downgraded due to slower than anticipated progress and hx of baseline deficits which may impact functional progress   Problem: RH Attention Goal: LTG Patient will demonstrate this level of attention during functional activites (SLP) Description: LTG:  Patient will will demonstrate this level of attention during functional activites (SLP) Flowsheets (Taken 07/28/2021 1758) LTG: Patient will demonstrate this level of attention during cognitive/linguistic activities with assistance of (SLP): Minimal Assistance - Patient > 75% Note: Goal downgraded due to slower than anticipated progress and hx of baseline deficits which may impact functional progress   Problem: RH Awareness Goal: LTG: Patient will demonstrate awareness during functional activites type of (SLP) Description: LTG: Patient will demonstrate awareness during functional activites type of (SLP) Flowsheets (Taken 07/28/2021 1758) LTG: Patient will demonstrate awareness during cognitive/linguistic activities with assistance of (SLP): Minimal Assistance - Patient > 75% Note: Goal downgraded due to slower than anticipated progress and hx of baseline deficits which may impact functional progress

## 2021-07-29 LAB — CMV DNA BY PCR, QUALITATIVE: CMV DNA, Qual PCR: NEGATIVE

## 2021-07-29 NOTE — Progress Notes (Signed)
Orthopedic Tech Progress Note Patient Details:  Kurt Foley 10-03-1975 [id number]  Patient ID: [name], male   DOB: July 22, 1975, 46 y.o.   MRN: [id number] Brace ordered. Artis Flock Tyanne Derocher 07/29/2021, 5:20 PM

## 2021-07-29 NOTE — Progress Notes (Signed)
Occupational Therapy Session Note  Patient Details  Name: Kurt Foley MRN: [id number] Date of Birth: 20-Oct-1975  Today's Date: 07/29/2021 OT Individual Time: 8875-7972 OT Individual Time Calculation (min): 73 min    Short Term Goals: Week 1:  OT Short Term Goal 1 (Week 1): STG=LTG 2/2 ELOS  Skilled Therapeutic Interventions/Progress Updates:  Patient met lying supine in bed asleep. Increased time to awaken. Paitent in agreement with OT treatment session. Mild discomfort reported in RUE with movement 2/2 increased tone. Movement within pain free tolerance for pain management. Supine to EOB with supervision A. Sit to stand from EOB and functional mobility to sink surface with CGA and use of hemiwalker. Patient declining bathing/dressing although he has worn the same past for the last several days. 1/3 grooming tasks standing at sink level with CGA and hand over hand to facilitate weight bearing through RUE. Noted slight flexion of R knee with patient unable to maintain neutral position. Functional mobility to ortho gym in prep for therapeutic activity, therapeutic exercise and NMR. Patient tolerated 10 minutes of Sci[employer] BUE arm erogmeter with manual facilitation of scapular elevation/depression. Ace wrap used to maintain position of RUE on pedal. Supervision A for sit to stand from wc and CGA for short-distance mobility to mat table. Focus shifted to RUE NMR. Ace wrap used to maintain position of R hand on 3lb weighted dowel. Mirror used for visual feedback for orientation to midline and hand over hand at RUE. Please refer below for additional details on RUE HEP.   RUE HEP with 2lb weighted dowel (2 sets x10 reps each) Wrist extension  Wrist flexion Arm curls  Session concluded with patient lying supine in bed with call bell within reach, bed alarm activated and all needs met.   Therapy Documentation Precautions:  Precautions Precautions: Fall Precaution Comments: R side hemi Required  Braces or Orthoses: Splint/Cast Splint/Cast: aircast on R ankle, R resting hand splint (resting hand splint not in room at time of PT eval on 2/9) Restrictions Weight Bearing Restrictions: No General:   Therapy/Group: Individual Therapy  [name] R [place]Davis 07/29/2021, 6:59 AM

## 2021-07-29 NOTE — Progress Notes (Signed)
Pt continues to be on q4 vital sign checks (yellow MEWS) due to tachycardia. Pt is asymptomatic with no change from baseline. Charge nurse Iona Beard RN aware

## 2021-07-29 NOTE — Progress Notes (Signed)
Physical Therapy Session Note  Patient Details  Name: Kurt Foley MRN: [id number] Date of Birth: [birth year]  Today's Date: 07/29/2021 PT Individual Time: 1300-1410 PT Individual Time Calculation (min): 70 min   Short Term Goals: Week 2:  PT Short Term Goal 1 (Week 2): STG=LTG based on ELOS  Skilled Therapeutic Interventions/Progress Updates:    Patient received supine in bed, agreeable to PT. He denies pain. PT donning sneakers + AFO TotalA for time. He ambulated to therapy gym with HW and CGA. Intermittent verbal cues for shortened R step length and increased L step length. Patient unable to maintain this for longer than ~30s before needing additional cuing. Orthotist present for AFO consult. Patient benefit from posterior leaf AFO and heel wedge to assist with managing R genu recurvatum- however, patient may need custom AFO to allow for increased stability. Patient does report feeling as though R knee may buckle during stance, however- this has not occurred in therapy. Patient ambulating to dayroom with HW and close supervision. NuStep x12 mins with L UE, BLE on level 2. Patient completing sit <> stand with L UE, CGA and R foot biased for increased weight shift R. Patient attempting agility ladder for increased L step length- however, remains with difficulty. Ambulating back to his room with [facility] and close supervision. Sitting at EOB, bed alarm on, call light within reach.   Therapy Documentation Precautions:  Precautions Precautions: Fall Precaution Comments: R side hemi Required Braces or Orthoses: Splint/Cast Splint/Cast: aircast on R ankle, R resting hand splint (resting hand splint not in room at time of PT eval on 2/9) Restrictions Weight Bearing Restrictions: No     Therapy/Group: Individual Therapy  Elizebeth Koller, PT, DPT, CBIS  07/29/2021, 7:34 AM

## 2021-07-29 NOTE — Progress Notes (Signed)
Occupational Therapy Weekly Progress Note  Patient Details  Name: Kurt Foley MRN: [id number] Date of Birth: 1975-11-10  Beginning of progress report period: July 21, 2021 End of progress report period: July 29, 2021  Today's Date: 07/29/2021 OT Individual Time: 1600-1630 OT Individual Time Calculation (min): 30 min    STGs not set on initial eval due to short ELOS.  Team opted to have pt stay longer to work on [employer] for stroke he had less than 1 year ago as he had never received rehab, per pt.    Patient continues to demonstrate the following deficits: decreased cardiorespiratoy endurance, abnormal tone, unbalanced muscle activation, and decreased coordination, and decreased standing balance, decreased postural control, hemiplegia, and decreased balance strategies and therefore will continue to benefit from skilled OT intervention to enhance overall performance with BADL.  Patient progressing toward long term goals..  Continue plan of care.  OT Short Term Goals Week 1:  OT Short Term Goal 1 (Week 1): STG=LTG 2/2 ELOS Week 2:  OT Short Term Goal 1 (Week 2): continuation of LTGs  Skilled Therapeutic Interventions/Progress Updates:    Pt received in room ready for therapy. Pt seen this session to focus on RUE NMR activities.   Had pt get into L sidelying in his bed. Did passive scapular glides and then pt worked on active scapular retraction and depression.  A/arom with reaching arm forward in sidelying.  He then moved to supine with knees flexed: -overhead reaching with dowel bar (right hand stabilized by coban) with A/arom for full tricep extension  -lifting bar to knees for abd crunches -pillow squeezes for int rotation of legs  Pt then sat to EOB and worked on a/arom of sh flexion with hand on quad cane handle. Pt stated he really liked this exercise. Showed pt how he can use his L hand to support R arm with exercises. Suggested he practice this exercise over the weekend.     Pt sitting EOB with all needs met, bed alarm set.    Therapy Documentation Precautions:  Precautions Precautions: Fall Precaution Comments: R side hemi Required Braces or Orthoses: Splint/Cast Splint/Cast: aircast on R ankle, R resting hand splint (resting hand splint not in room at time of PT eval on 2/9) Restrictions Weight Bearing Restrictions: No      Pain: Pain Assessment Pain Scale: 0-10 Pain Score: 0-No pain ADL: ADL Eating: Set up Grooming: Setup Upper Body Bathing: Minimal assistance Where Assessed-Upper Body Bathing: Shower Lower Body Bathing: Minimal assistance Where Assessed-Lower Body Bathing: Shower Upper Body Dressing: Minimal assistance Lower Body Dressing: Minimal assistance Where Assessed-Lower Body Dressing: Edge of bed Toileting: Minimal assistance Where Assessed-Toileting: Glass blower/designer: Psychiatric nurse Method: Human resources officer: Not assessed Social research officer, government: Curator Method: Heritage manager: Radio broadcast assistant, Grab bars ADL Comments: Patient declined bathing at time of inital eval.   Therapy/Group: Individual Therapy  [place] 07/29/2021, 12:52 PM

## 2021-07-29 NOTE — Progress Notes (Signed)
PROGRESS NOTE   Subjective/Complaints:  No pain c/o, very motivated in therapy , tired but no c/o, slept well   ROS:  Pt denies SOB, abd pain, CP, N/V/C/D, and vision changes  Objective:   No results found. No results for input(s): WBC, HGB, HCT, PLT in the last 72 hours.  No results for input(s): NA, K, CL, CO2, GLUCOSE, BUN, CREATININE, CALCIUM in the last 72 hours.    Intake/Output Summary (Last 24 hours) at 07/29/2021 0742 Last data filed at 07/29/2021 0705 Gross per 24 hour  Intake 90 ml  Output 600 ml  Net -510 ml         Physical Exam: Vital Signs Blood pressure 101/65, pulse (!) 116, temperature 98.4 F (36.9 C), temperature source Oral, resp. rate 15, height _0  (1.651 m), weight 53.7 kg, SpO2 96 %.    General: No acute distress Mood and affect are appropriate Heart: Regular rate and rhythm no rubs murmurs or extra sounds Lungs: Clear to auscultation, breathing unlabored, no rales or wheezes Abdomen: Positive bowel sounds, soft nontender to palpation, nondistended Extremities: No clubbing, cyanosis, or edema Skin: No evidence of breakdown, no evidence of rash  Ext: no clubbing, cyanosis, or edema Psych: pleasant and cooperative  Neuro:  Alert and oriented x 3. Normal insight and awareness. Intact Memory. Normal language. Speech dysarthric. Left facial weakness. RUE 0/5. RLE 3- prox to 0/5 distally. Fair PROM. Decreased sensation to LT on right Musculoskeletal: right shoulder remains tight, some pain with PROM. Low back TTP.   Assessment/Plan: 1. Functional deficits which require 3+ hours per day of interdisciplinary therapy in a comprehensive inpatient rehab setting. Physiatrist is providing close team supervision and 24 hour management of active medical problems listed below. Physiatrist and rehab team continue to assess barriers to discharge/monitor patient progress toward functional and medical  goals  Care Tool:  Bathing  Bathing activity did not occur: Refused           Bathing assist       Upper Body Dressing/Undressing Upper body dressing Upper body dressing/undressing activity did not occur (including orthotics): Refused What is the patient wearing?: Pull over shirt    Upper body assist Assist Level: Minimal Assistance - Patient > 75%    Lower Body Dressing/Undressing Lower body dressing      What is the patient wearing?: Pants     Lower body assist Assist for lower body dressing: Minimal Assistance - Patient > 75%     Toileting Toileting    Toileting assist Assist for toileting: Minimal Assistance - Patient > 75%     Transfers Chair/bed transfer  Transfers assist     Chair/bed transfer assist level: Contact Guard/Touching assist     Locomotion Ambulation   Ambulation assist      Assist level: Contact Guard/Touching assist Assistive device: Walker-hemi Max distance: 161f   Walk 10 feet activity   Assist     Assist level: Contact Guard/Touching assist Assistive device: Walker-hemi   Walk 50 feet activity   Assist    Assist level: Contact Guard/Touching assist Assistive device: Walker-hemi    Walk 150 feet activity   Assist    Assist  level: Contact Guard/Touching assist Assistive device: Walker-hemi    Walk 10 feet on uneven surface  activity   Assist Walk 10 feet on uneven surfaces activity did not occur: Safety/medical concerns (R hemi, fatigue)         Wheelchair     Assist Is the patient using a wheelchair?: Yes Type of Wheelchair: Manual    Wheelchair assist level: Set up assist Max wheelchair distance: 150    Wheelchair 50 feet with 2 turns activity    Assist        Assist Level: Supervision/Verbal cueing   Wheelchair 150 feet activity     Assist      Assist Level: Supervision/Verbal cueing   Blood pressure 101/65, pulse (!) 116, temperature 98.4 F (36.9 C),  temperature source Oral, resp. rate 15, height _0  (1.651 m), weight 53.7 kg, SpO2 96 %.  Medical Problem List and Plan: 1. 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  Antithrombotics: -DVT/anticoagulation:  Pharmaceutical: Lovenox             -antiplatelet therapy: ASA 3. Chronic LBP/Pain Management:   --Oxycodone 10 mg has been effective in the past-   -receiving 5-69m scheduled at night as well as prn --Lidocaine patches at nights (discomfort due to bed).  -voltaren gel for right shoulder pain 2/14- pain controlled- denies pain- con't regimen 4. Mood: LCSW to follow for evaluation and support.              -antipsychotic agents: N/A 5. Neuropsych: This patient is  capable of making decisions on his own behalf. 6. Skin/Wound Care: Routine pressure relief measures.  7. Fluids/Electrolytes/Nutrition/FTT: encourage po  -I personally reviewed the patient's labs today.    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  9. 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 Continue Biktarvy             --on Bactrim for PJP prophylaxis.  10. Pancytopenia: Neutropenia with WBC stable at 2.8K             -anemia slightly improved at 9.1       11. Insomnia: Encouraged use of Remeron and Seroquel also to help with sleep.  Increased Remeron to 167m          improved 2/17 12. Tachycardia: Monitor HR/BP TID- Monitor HR with activity, EKG with sinus tach     Vitals:   07/29/21 0358 07/29/21 0730  BP: 101/65   Pulse: (!) 116   Resp: 15   Temp: 98.4 F (36.9 C)   SpO2: 96% 96%  BPs on low side so hesistant in starting BB,  asymptomatic, may have deconditioning component, asymptomatic   14. Abnormal LFTs: Continue to hold Lipitor. Will hold motrin also for now.  -  LFTs stable, sl improved 2/9.   15. COPD: Continue MDI. No wheezing        LOS: 9 days A FACE TO FACE EVALUATION WAS PERFORMED  AnCharlett Blake/17/2023, 7:42 AM

## 2021-07-31 NOTE — Progress Notes (Signed)
PROGRESS NOTE   Subjective/Complaints:  Discussed with PT, working to transition off hemiwalker   ROS:  Pt denies SOB, abd pain, CP, N/V/C/D, and vision changes  Objective:   No results found. No results for input(s): WBC, HGB, HCT, PLT in the last 72 hours.  No results for input(s): NA, K, CL, CO2, GLUCOSE, BUN, CREATININE, CALCIUM in the last 72 hours.    Intake/Output Summary (Last 24 hours) at 07/31/2021 0808 Last data filed at 07/31/2021 0700 Gross per 24 hour  Intake 720 ml  Output 725 ml  Net -5 ml         Physical Exam: Vital Signs Blood pressure 96/74, pulse (!) 117, temperature (!) 97.5 F (36.4 C), resp. rate 18, height _0  (1.651 m), weight 53.7 kg, SpO2 98 %.    General: No acute distress Mood and affect are appropriate Heart: Regular rate and rhythm no rubs murmurs or extra sounds Lungs: Clear to auscultation, breathing unlabored, no rales or wheezes Abdomen: Positive bowel sounds, soft nontender to palpation, nondistended Extremities: No clubbing, cyanosis, or edema Skin: No evidence of breakdown, no evidence of rash  Ext: no clubbing, cyanosis, or edema Psych: pleasant and cooperative  Neuro:  Alert and oriented x 3. Normal insight and awareness. Intact Memory. Normal language. Speech dysarthric. Left facial weakness. RUE 0/5. RLE 3- prox to 0/5 distally. Fair PROM. Decreased sensation to LT on right Musculoskeletal: right shoulder remains tight, some pain with PROM. Low back TTP.   Assessment/Plan: 1. Functional deficits which require 3+ hours per day of interdisciplinary therapy in a comprehensive inpatient rehab setting. Physiatrist is providing close team supervision and 24 hour management of active medical problems listed below. Physiatrist and rehab team continue to assess barriers to discharge/monitor patient progress toward functional and medical goals  Care Tool:  Bathing   Bathing activity did not occur: Refused           Bathing assist       Upper Body Dressing/Undressing Upper body dressing Upper body dressing/undressing activity did not occur (including orthotics): Refused What is the patient wearing?: Pull over shirt    Upper body assist Assist Level: Minimal Assistance - Patient > 75%    Lower Body Dressing/Undressing Lower body dressing      What is the patient wearing?: Pants     Lower body assist Assist for lower body dressing: Minimal Assistance - Patient > 75%     Toileting Toileting    Toileting assist Assist for toileting: Minimal Assistance - Patient > 75%     Transfers Chair/bed transfer  Transfers assist     Chair/bed transfer assist level: Contact Guard/Touching assist     Locomotion Ambulation   Ambulation assist      Assist level: Contact Guard/Touching assist Assistive device: Walker-hemi Max distance: 17f   Walk 10 feet activity   Assist     Assist level: Contact Guard/Touching assist Assistive device: Walker-hemi   Walk 50 feet activity   Assist    Assist level: Contact Guard/Touching assist Assistive device: Walker-hemi    Walk 150 feet activity   Assist    Assist level: Contact Guard/Touching assist Assistive device: Walker-hemi  Walk 10 feet on uneven surface  activity   Assist Walk 10 feet on uneven surfaces activity did not occur: Safety/medical concerns (R hemi, fatigue)         Wheelchair     Assist Is the patient using a wheelchair?: Yes Type of Wheelchair: Manual    Wheelchair assist level: Set up assist Max wheelchair distance: 150    Wheelchair 50 feet with 2 turns activity    Assist        Assist Level: Supervision/Verbal cueing   Wheelchair 150 feet activity     Assist      Assist Level: Supervision/Verbal cueing   Blood pressure 96/74, pulse (!) 117, temperature (!) 97.5 F (36.4 C), resp. rate 18, height _0  (1.651 m),  weight 53.7 kg, SpO2 98 %.  Medical Problem List and Plan: 1. 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  Antithrombotics: -DVT/anticoagulation:  Pharmaceutical: Lovenox             -antiplatelet therapy: ASA 3. Chronic LBP/Pain Management:   --Oxycodone 10 mg has been effective in the past-   -receiving 5-97m scheduled at night as well as prn --Lidocaine patches at nights (discomfort due to bed).  -voltaren gel for right shoulder pain 2/14- pain controlled- denies pain- con't regimen 4. Mood: LCSW to follow for evaluation and support.              -antipsychotic agents: N/A 5. Neuropsych: This patient is  capable of making decisions on his own behalf. 6. Skin/Wound Care: Routine pressure relief measures.  7. Fluids/Electrolytes/Nutrition/FTT: encourage po  -I personally reviewed the patient's labs today.    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  9. 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 Continue Biktarvy             --on Bactrim for PJP prophylaxis.  10. Pancytopenia: Neutropenia with WBC stable at 2.8K             -anemia slightly improved at 9.1       11. Insomnia: Encouraged use of Remeron and Seroquel also to help with sleep.  Increased Remeron to 180m          improved 2/17 12. Tachycardia: Monitor HR/BP TID- Monitor HR with activity, EKG with sinus tach     Vitals:   07/30/21 2358 07/31/21 0357  BP: 112/85 96/74  Pulse: (!) 109 (!) 117  Resp: 16 18  Temp: 98.3 F (36.8 C) (!) 97.5 F (36.4 C)  SpO2: 100% 98%  BPs on low side so hesistant in starting BB, asymptomatic, may have deconditioning  component, asymptomatic   14. Abnormal LFTs: Continue to hold Lipitor. Will hold motrin also for now.  -  LFTs stable, sl improved 2/9.   15. COPD: Continue MDI. No wheezing        LOS: 11 days A FACE TO FACE EVALUATION WAS PERFORMED  AnCharlett Blake/19/2023, 8:08 AM

## 2021-07-31 NOTE — Progress Notes (Signed)
Physical Therapy Session Note  Patient Details  Name: Kurt Foley MRN: [id number] Date of Birth: Jan 13, 1976  Today's Date: 07/31/2021 PT Individual Time: 0755-0855 PT Individual Time Calculation (min): 60 min   Short Term Goals: Week 1:  PT Short Term Goal 1 (Week 1): STG = LTG due to ELOS PT Short Term Goal 1 - Progress (Week 1): Progressing toward goal Week 2:  PT Short Term Goal 1 (Week 2): STG=LTG based on ELOS Week 3:     Skilled Therapeutic Interventions/Progress Updates:    Pt eager to participate, denies pain. Supine to sit w/supervision AFO/shoe donned w/total assist, mod I sitting balance on edge of bed Sit to stand w/hemiwalker w/cga, cues for R foot placement. Deferred hemiwalker for NMRE gait without AD x 179ft w/cues for step thru gait pattern, cues to decrease R hip ER and control step length RLE  Lateral  ups onto 2in step using counter for support and multimodal cues to faciliate quad and glut activation and control knee extension thrust  Standing at counter R hip abduction w/cue to "lead w/heel" to isolate abd and minimize ER w/motion.  Standing attempted HS curls but unable to isolate without hip flexion.  Able to eccentrically slow when placed in flexion by therapist.   Sidlying clamshells R hip w/assist to stabilize R foot to prevent knee extension e/effort x 12  Supine bridge w/LLE first in bias then full extension to promote isolation of R hip w/exercise, x 5 sec hold x 12  Supine R hip fall outs/clamshell x 15  Gait x 150 ft w/hemiwalker, R AFO/heel wedge, cga, improved knee control, decreased ER, improved consistency of step length and step thru gait pattern Turn/sit to edge of bed w/cga.  Reviewed and repeated set up/importance of maintaining midline/forced use of RLE w/Sit to stand for improved mechanics/facilitate return, mult times during session.  Pt left seated on edge of bed and handed off to NT for requested toileting needs.   Therapy  Documentation Precautions:  Precautions Precautions: Fall Precaution Comments: R side hemi Required Braces or Orthoses: Splint/Cast Splint/Cast: aircast on R ankle, R resting hand splint (resting hand splint not in room at time of PT eval on 2/9) Restrictions Weight Bearing Restrictions: No    Therapy/Group: Individual Therapy Rada Hay, PT   Shearon Balo 07/31/2021, 12:21 PM

## 2021-08-01 LAB — COMPREHENSIVE METABOLIC PANEL
ALT: 71 U/L — ABNORMAL HIGH (ref 0–44)
AST: 56 U/L — ABNORMAL HIGH (ref 15–41)
Albumin: 3.1 g/dL — ABNORMAL LOW (ref 3.5–5.0)
Alkaline Phosphatase: 152 U/L — ABNORMAL HIGH (ref 38–126)
Anion gap: 13 (ref 5–15)
BUN: 17 mg/dL (ref 6–20)
CO2: 18 mmol/L — ABNORMAL LOW (ref 22–32)
Calcium: 9.4 mg/dL (ref 8.9–10.3)
Chloride: 102 mmol/L (ref 98–111)
Creatinine, Ser: 0.88 mg/dL (ref 0.61–1.24)
GFR, Estimated: >60 mL/min (ref >60)
Glucose, Bld: 100 mg/dL — ABNORMAL HIGH (ref 70–99)
Potassium: 4.4 mmol/L (ref 3.5–5.1)
Sodium: 133 mmol/L — ABNORMAL LOW (ref 135–145)
Total Bilirubin: 0.3 mg/dL (ref 0.3–1.2)
Total Protein: 7.2 g/dL (ref 6.5–8.1)

## 2021-08-01 LAB — CBC WITH DIFFERENTIAL/PLATELET
Abs Immature Granulocytes: 0.16 10*3/uL — ABNORMAL HIGH (ref 0.00–0.07)
Basophils Absolute: 0 10*3/uL (ref 0.0–0.1)
Basophils Relative: 1 %
Eosinophils Absolute: 0 10*3/uL (ref 0.0–0.5)
Eosinophils Relative: 1 %
HCT: 28.9 % — ABNORMAL LOW (ref 39.0–52.0)
Hemoglobin: 8.8 g/dL — ABNORMAL LOW (ref 13.0–17.0)
Immature Granulocytes: 5 %
Lymphocytes Relative: 18 %
Lymphs Abs: 0.5 10*3/uL — ABNORMAL LOW (ref 0.7–4.0)
MCH: 28.2 pg (ref 26.0–34.0)
MCHC: 30.4 g/dL (ref 30.0–36.0)
MCV: 92.6 fL (ref 80.0–100.0)
Monocytes Absolute: 0.4 10*3/uL (ref 0.1–1.0)
Monocytes Relative: 12 %
Neutro Abs: 1.9 10*3/uL (ref 1.7–7.7)
Neutrophils Relative %: 63 %
Platelets: 397 10*3/uL (ref 150–400)
RBC: 3.12 MIL/uL — ABNORMAL LOW (ref 4.22–5.81)
RDW: 19.4 % — ABNORMAL HIGH (ref 11.5–15.5)
WBC: 2.9 10*3/uL — ABNORMAL LOW (ref 4.0–10.5)
nRBC: 0 % (ref 0.0–0.2)

## 2021-08-01 MED ORDER — ACETAMINOPHEN 325 MG PO TABS: 325.0000 mg | ORAL_TABLET | ORAL | Status: DC | PRN

## 2021-08-01 NOTE — Progress Notes (Signed)
Physical Therapy Session Note  Patient Details  Name: Kurt Foley MRN: [id number] Date of Birth: 08/17/1975  Today's Date: 08/01/2021 PT Individual Time: [id number] PT Individual Time Calculation (min): 26 min   Short Term Goals: Week 1:  PT Short Term Goal 1 (Week 1): STG = LTG due to ELOS PT Short Term Goal 1 - Progress (Week 1): Progressing toward goal  Skilled Therapeutic Interventions/Progress Updates:    pt received in bed and agreeable to therapy. No complaint of pain. Bed mobility mod I with bed rail. Sit to stand throughout session with supervision. Session focused on gait training with and without AD. Longest bout 250 ft with HW. Longest bout without AD x 80 ft. Pt cued for longer step through with LLE for improved stance time on RLE, pt unable to perform without AD, but was able with HW with visual demonstration and VC. Pt returned to room after session and remained seated EOB with bed alarm active. Pt stated had to use bathroom but then took phone call. Therapist notified RN who stated she would assist with toileting when he had completed phone call.   Therapy Documentation Precautions:  Precautions Precautions: Fall Precaution Comments: R side hemi Required Braces or Orthoses: Splint/Cast Splint/Cast: aircast on R ankle, R resting hand splint (resting hand splint not in room at time of PT eval on 2/9) Restrictions Weight Bearing Restrictions: No General:      Therapy/Group: Individual Therapy  Mickel Fuchs 08/01/2021, 3:32 PM

## 2021-08-01 NOTE — Progress Notes (Signed)
Physical Therapy Session Note  Patient Details  Name: Kurt Foley MRN: [id number] Date of Birth: [birth year]  Today's Date: 08/01/2021 PT Individual Time: 1300-1400 PT Individual Time Calculation (min): 60 min   Short Term Goals: Week 2:  PT Short Term Goal 1 (Week 2): STG=LTG based on ELOS  Skilled Therapeutic Interventions/Progress Updates:    Patient received sitting edge of bed, agreeable to PT. He denies pain. Patient ambulating to therapy gym with [facility] and supervision. Per orthotist, brace not ready yet. Patient ambulating 2x72ft with no AD and CGA/MinA. Verbal cues for longer L step length and increased stance time R LE. Stance control blocked practice standing tapping L foot to box with MinA provided. Patient ambulating to dayroom with HW and supervision where he completed on NuStep with B LE only on level 1. Patient ambulating back to his room with HW and CGA/supervision. Patient requiring ~77mins to ambulate ~236ft. Remaining EOB, bed alarm on, call light within reach. PT providing patient with HEP.   Therapy Documentation Precautions:  Precautions Precautions: Fall Precaution Comments: R side hemi Required Braces or Orthoses: Splint/Cast Splint/Cast: aircast on R ankle, R resting hand splint (resting hand splint not in room at time of PT eval on 2/9) Restrictions Weight Bearing Restrictions: No    Therapy/Group: Individual Therapy  Elizebeth Koller, PT, DPT, CBIS  08/01/2021, 7:35 AM

## 2021-08-01 NOTE — Progress Notes (Signed)
Physical Therapy Session Note  Patient Details  Name: Kurt Foley MRN: [id number] Date of Birth: 1975/11/03  Today's Date: 08/01/2021 PT Individual Time: 1031-1131 PT Individual Time Calculation (min): 60 min   Short Term Goals: Week 2:  PT Short Term Goal 1 (Week 2): STG=LTG based on ELOS   Skilled Therapeutic Interventions/Progress Updates:  Patient supine asleep in bed on entrance to room. Patient easily roused, allowed time to fully wake. Then alert and agreeable to PT session.   Patient with no pain complaint throughout session.  Therapeutic Activity: Bed Mobility: Patient performed supine <> sit with Mod I. No cueing required but use of bed rail to reach seated position on EOB.  Transfers: Patient performed sit<>stand and stand pivot transfers throughout session with supervision/ int CGA. Some stand pivots performed with HW and some without. Provided verbal cues for sequence.  Gait Training:  Patient ambulated with [facility] for 75" with close supervision, then removed HW while standing and pt completes 45' with no AD into main gym with CGA. Demonstrated step through with RLE followed by quick step-topattern with LLE followed by pause prior to initiating stp forward with RLE. NMR to work on step lengths and timting of stepping pattern. Return to room performed with no AD >200' with CGA and little carry over from [facility] training. Two instances of misstep, but pt able to self correct one with CGA and other requires MinA to catch balance. Provided vc/ tc for reducing ER of RLE placement, decreasing step length of RLE, increasing step length of LLE.  Neuromuscular Re-ed: NMR facilitated during session with focus on improving quality of gait. Pt guided in stepping forward onto red disk with R foot then stepping L foot  onto blue disk which is placed past the R foot. All with pause when stepping forward onto RLE in order to challenge balance and promote improved quality of gait. No LOB noted.  Progressed to ambulation using 18 dots on floor used as targets for stepping pattern placement with each LE. Pause with RLE forward and no LOB from pt. No AD.   During seated rest,  R elbow stretched into full extension with pressure provided to distal bicep tendon and quick oscillations x3 with hold in extension for each. NMR performed for improvements in motor control and coordination, balance, sequencing, judgement, and self confidence/ efficacy in performing all aspects of mobility at highest level of independence.   Patient seated on EOB at end of session with brakes locked, bed alarm set, and all needs within reach.   Therapy Documentation Precautions:  Precautions Precautions: Fall Precaution Comments: R side hemi Required Braces or Orthoses: Splint/Cast Splint/Cast: aircast on R ankle, R resting hand splint (resting hand splint not in room at time of PT eval on 2/9) Restrictions Weight Bearing Restrictions: No General:   Pain: No pain complaint during session.   Therapy/Group: Individual Therapy  Loel Dubonnet PT, DPT 08/01/2021, 5:19 PM

## 2021-08-01 NOTE — Discharge Instructions (Addendum)
Inpatient Rehab Discharge Instructions  Kurt Foley Discharge date and time:  08/05/21  Activities/Precautions/ Functional Status: Activity: no lifting, driving, or strenuous exercise ftill cleared by MD Diet: regular diet Wound Care: none needed   Functional status:  ___ No restrictions     ___ Walk up steps independently _x__ 24/7 supervision/assistance   ___ Walk up steps with assistance ___ Intermittent supervision/assistance  ___ Bathe/dress independently ___ Walk with walker     ___ Bathe/dress with assistance ___ Walk Independently    ___ Shower independently ___ Walk with assistance    _x__ Shower with assistance _x__ No alcohol     ___ Return to work/school ________  Special Instructions:     COMMUNITY REFERRALS UPON DISCHARGE:    Home Exercise Program due to could not get charity home health [facility] FOR PT [phone]  Medical Equipment/Items Ordered: hemi-walker & tub bench                                                 Agency/Supplier:[facility] [employer]  [phone]  MATCH GIVEN TO ASSIST WITH MEDICATIONS AT DISCHARGE    My questions have been answered and I understand these instructions. I will adhere to these goals and the provided educational materials after my discharge from the hospital.  Patient/Caregiver Signature _______________________________ Date __________  Clinician Signature _______________________________________ Date __________  Please bring this form and your medication list with you to all your follow-up doctor's appointments.

## 2021-08-01 NOTE — Progress Notes (Signed)
PROGRESS NOTE   Subjective/Complaints:  Pt wants to stay longer- past the 2 weeks- although he's working to get off hemiwalker per notes.   LBM yesterday- denies pain.    ROS:   Pt denies SOB, abd pain, CP, N/V/C/D, and vision changes  Objective:   No results found. Recent Labs    08/01/21 0546  WBC 2.9*  HGB 8.8*  HCT 28.9*  PLT 397   Recent Labs    08/01/21 0546  NA 133*  K 4.4  CL 102  CO2 18*  GLUCOSE 100*  BUN 17  CREATININE 0.88  CALCIUM 9.4     Intake/Output Summary (Last 24 hours) at 08/01/2021 1319 Last data filed at 08/01/2021 0600 Gross per 24 hour  Intake 240 ml  Output 825 ml  Net -585 ml        Physical Exam: Vital Signs Blood pressure 111/89, pulse (!) 116, temperature 98.2 F (36.8 C), temperature source Oral, resp. rate 15, height _0  (1.651 m), weight 53.7 kg, SpO2 99 %.     General: awake, alert, appropriate, very dysarthric; NAD HENT: conjugate gaze; oropharynx moist CV: mildly tachycardic rate; no JVD Pulmonary: CTA B/L; no W/R/R- good air movement GI: soft, NT, ND, (+)BS Psychiatric: appropriate- upset about not automiacally extending his stay Neurological: Ox3- hard to understand due to dysarthria  Extremities: No clubbing, cyanosis, or edema Skin: No evidence of breakdown, no evidence of rash  Ext: no clubbing, cyanosis, or edema Psych: pleasant and cooperative  Neuro:  Alert and oriented x 3. Normal insight and awareness. Intact Memory. Normal language. Speech dysarthric. Left facial weakness. RUE 0/5. RLE 3- prox to 0/5 distally. Fair PROM. Decreased sensation to LT on right Musculoskeletal: right shoulder remains tight, some pain with PROM. Low back TTP.   Assessment/Plan: 1. Functional deficits which require 3+ hours per day of interdisciplinary therapy in a comprehensive inpatient rehab setting. Physiatrist is providing close team supervision and 24 hour  management of active medical problems listed below. Physiatrist and rehab team continue to assess barriers to discharge/monitor patient progress toward functional and medical goals  Care Tool:  Bathing  Bathing activity did not occur: Refused           Bathing assist       Upper Body Dressing/Undressing Upper body dressing Upper body dressing/undressing activity did not occur (including orthotics): Refused What is the patient wearing?: Pull over shirt    Upper body assist Assist Level: Minimal Assistance - Patient > 75%    Lower Body Dressing/Undressing Lower body dressing      What is the patient wearing?: Pants     Lower body assist Assist for lower body dressing: Minimal Assistance - Patient > 75%     Toileting Toileting    Toileting assist Assist for toileting: Minimal Assistance - Patient > 75%     Transfers Chair/bed transfer  Transfers assist     Chair/bed transfer assist level: Contact Guard/Touching assist     Locomotion Ambulation   Ambulation assist      Assist level: Contact Guard/Touching assist Assistive device: Walker-hemi Max distance: 155f   Walk 10 feet activity   Assist  Assist level: Contact Guard/Touching assist Assistive device: Walker-hemi   Walk 50 feet activity   Assist    Assist level: Contact Guard/Touching assist Assistive device: Walker-hemi    Walk 150 feet activity   Assist    Assist level: Contact Guard/Touching assist Assistive device: Walker-hemi    Walk 10 feet on uneven surface  activity   Assist Walk 10 feet on uneven surfaces activity did not occur: Safety/medical concerns (R hemi, fatigue)         Wheelchair     Assist Is the patient using a wheelchair?: Yes Type of Wheelchair: Manual    Wheelchair assist level: Set up assist Max wheelchair distance: 150    Wheelchair 50 feet with 2 turns activity    Assist        Assist Level: Supervision/Verbal cueing    Wheelchair 150 feet activity     Assist      Assist Level: Supervision/Verbal cueing   Blood pressure 111/89, pulse (!) 116, temperature 98.2 F (36.8 C), temperature source Oral, resp. rate 15, height _0  (1.651 m), weight 53.7 kg, SpO2 99 %.  Medical Problem List and Plan: 1. 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  Antithrombotics: -DVT/anticoagulation:  Pharmaceutical: Lovenox             -antiplatelet therapy: ASA 3. Chronic LBP/Pain Management:   --Oxycodone 10 mg has been effective in the past-   -receiving 5-68m scheduled at night as well as prn --Lidocaine patches at nights (discomfort due to bed).  -voltaren gel for right shoulder pain 2/14- pain controlled- denies pain- con't regimen  2/20- denies pain-con't regimen 4. Mood: LCSW to follow for evaluation and support.              -antipsychotic agents: N/A 5. Neuropsych: This patient is  capable of making decisions on his own behalf. 6. Skin/Wound Care: Routine pressure relief measures.  7. Fluids/Electrolytes/Nutrition/FTT: encourage po  -I personally reviewed the patient's labs today.    2/9 mild hyponatremia--recheck Monday  2/20- Na 133- will con't to monitor  -remeron/megace to help with appetite  -on D3 diet due to poorly fitting dentures  -intake is fair/inconsistent--continue to encourage 8. PML/Disseminated MAC: Continue Zithromax and Ethambutol.  9. 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 Continue Biktarvy             --on Bactrim for PJP prophylaxis.  10. Pancytopenia: Neutropenia with WBC stable at 2.8K             -anemia slightly improved at 9.1       11. Insomnia: Encouraged use of Remeron and Seroquel also to help with sleep.  Increased Remeron to 112m          improved 2/17 12. Tachycardia: Monitor HR/BP  TID- Monitor HR with activity, EKG with sinus tach     Vitals:   08/01/21 0809 08/01/21 1205  BP: 104/86 111/89  Pulse: (!) 114 (!) 116  Resp: 16 15  Temp: 97.9 F (36.6 C) 98.2 F (36.8 C)  SpO2: 97% 99%  BPs on low side so hesistant in starting BB, asymptomatic, may have deconditioning component, asymptomatic asymptomatic- so con't off BB  2/20- no change-  14. Abnormal LFTs: Continue to hold Lipitor. Will hold motrin also for now.  -  LFTs stable, sl improved 2/9.   15. COPD: Continue MDI. No wheezing    I spent a total of  35  minutes on total care today- >50% coordination of care- due to d/w team about pt wanting to extend.      LOS: 12 days A FACE TO FACE EVALUATION WAS PERFORMED  Naomy Esham 08/01/2021, 1:19 PM

## 2021-08-01 NOTE — Progress Notes (Signed)
Occupational Therapy Session Note  Patient Details  Name: Kurt Foley MRN: [id number] Date of Birth: Jul 18, 1975  Today's Date: 08/01/2021 OT Individual Time: 0800-0900 OT Individual Time Calculation (min): 60 min    Short Term Goals: Week 2:  OT Short Term Goal 1 (Week 2): continuation of LTGs     Skilled Therapeutic Interventions/Progress Updates:    Pt received in bed and ready for therapy. Pt stated he did not want to shower or change clothing.  He was ready "to work on exercises".  Pt assisted with donning shoes and AFO. Pt then transferred to w/c using hemiwalker with CGA and cues to step R foot back to w/c.  Pt taken to gym to transfer to mat. Today's session focused on RUE NMR. Pt's hypertone was increased today.  Spent quite a bit of time working on ROM and relaxation movement with trunk rotation, gentle arm glides.   Pt has significant anterior subluxation, so continually educating him on safe self ROM to protect shoulder.   Worked on shoulder ROM with rolling ball forward and back with pt able to do first 5% of movement range.  R hand wt bearing on mat.  Trialing  a Give mohr sling to use for R arm to support subluxation with ambulation. Fitted pt with sling.  This did reduce his subluxation with ambulation.  Pt transferred back to w/c and returned to room.  Pt transferred back to bed to rest.   Alarm set and all needs met.   Therapy Documentation Precautions:  Precautions Precautions: Fall Precaution Comments: R side hemi Required Braces or Orthoses: Splint/Cast Splint/Cast: aircast on R ankle, R resting hand splint (resting hand splint not in room at time of PT eval on 2/9) Restrictions Weight Bearing Restrictions: No    Vital Signs: Therapy Vitals Temp: 97.9 F (36.6 C) Temp Source: Oral Pulse Rate: (!) 118 Resp: 20 BP: 100/74 Patient Position (if appropriate): Lying Oxygen Therapy SpO2: 97 % Pain: no c/o pain    ADL: ADL Eating: Set up Grooming:  Setup Upper Body Bathing: Minimal assistance Where Assessed-Upper Body Bathing: Shower Lower Body Bathing: Minimal assistance Where Assessed-Lower Body Bathing: Shower Upper Body Dressing: Minimal assistance Lower Body Dressing: Minimal assistance Where Assessed-Lower Body Dressing: Edge of bed Toileting: Minimal assistance Where Assessed-Toileting: Glass blower/designer: Psychiatric nurse Method: Human resources officer: Not assessed Social research officer, government: Curator Method: Heritage manager: Radio broadcast assistant, Grab bars ADL Comments: Patient declined bathing at time of inital eval.   Therapy/Group: Individual Therapy  [place] 08/01/2021, 8:01 AM

## 2021-08-02 LAB — EPSTEIN BARR VRS(EBV DNA BY PCR)

## 2021-08-02 LAB — MISC LABCORP TEST (SEND OUT): Labcorp test code: 51352

## 2021-08-02 NOTE — Progress Notes (Signed)
Occupational Therapy Session Note  Patient Details  Name: Kurt Foley MRN: [id number] Date of Birth: 06-19-1975  Today's Date: 08/02/2021 OT Group Time: 4098-1191 OT Group Time Calculation (min): 60 min   Short Term Goals: Week 2:  OT Short Term Goal 1 (Week 2): continuation of LTGs  Skilled Therapeutic Interventions/Progress Updates:  Pt completed group session with a focus on social interaction, increasing activity tolerance, BUE strength/ endurance, and  w/c mobility. First pt engaged in group activity of corn hole where pts were paired into two teams to engage in modified corn hole game to work on w/c mobility, activity tolerance, and UB coordination and strength. Pt completed turns from seated in w/c using LUE. Pt needed total A to position w/c but assisted with locking brakes.  Next, pt completed  seated UB therapeutic activity where pt was instructed to roll large dice when it was pts turn. Each number rolled correlated with UB therex/ number of repetitions listed on board. UB therex included flys, chest presses, punches, bicep curls, upward rows, over heard presses with a range of reps from 10-20.  Pt used LUE only d/t R hemi.  . Education provided on importance of determining appropriate modifications that benefited each pt  in order to meet pts specific needs. Pt actively participating in group conversations and encouraging other group members during therapeutic activity. Pt jovial during session noted to sing along with music and engage in all group activities.   Ended with 2 mins of guided deep breathing where pts were instructed on sequence of deep breathing and benefits of deep breathing to accommodate for stress, anxiety and relaxation. Pt transported back to room by RT.   Therapy Documentation Precautions:  Precautions Precautions: Fall Precaution Comments: R side hemi Required Braces or Orthoses: Splint/Cast Splint/Cast: aircast on R ankle, R resting hand splint (resting  hand splint not in room at time of PT eval on 2/9) Restrictions Weight Bearing Restrictions: No  Pain: no pain reported during session     Therapy/Group: Group Therapy  Barron Schmid 08/02/2021, 3:55 PM

## 2021-08-02 NOTE — Patient Care Conference (Signed)
Inpatient RehabilitationTeam Conference and Plan of Care Update Date: 08/02/2021   Time: 12:02 PM   Patient Name: Kurt Foley      Medical Record Number: [id number]  Date of Birth: 10-06-1975 Sex: Male         Room/Bed: 4M04C/4M04C-01 Payor Info: Payor: /    Admit Date/Time:  07/20/2021 12:57 PM  Primary Diagnosis:  Encephalitis  Hospital Problems: Principal Problem:   Encephalitis Active Problems:   Debility    Expected Discharge Date: Expected Discharge Date: 08/05/21  Team Members Present: Physician leading conference: Dr. Genice Rouge Social Worker Present: Dossie Der, LCSW Nurse Present: Chana Bode, RN PT Present: Merry Lofty, PT OT Present: Primitivo Gauze, OT SLP Present: Feliberto Gottron, SLP PPS Coordinator present : Fae Pippin, SLP     Current Status/Progress Goal Weekly Team Focus  Bowel/Bladder   Pt is cont of B/B. LBM 08/01/21  Remain cont of B/B  Assess qshift and PRN   Swallow/Nutrition/ Hydration             ADL's   Min A overall from an ambulatory level, no functional change with RUE  supervision overall from an ambulatory level  balance, R NMR, ADL training, pt education   Mobility   ModI bed mob, CGA/ SPV STS, CGA/SPV transfers with HW and AFO, >170ft with HW, AFO and CGA/spv, stairs CGA  ModI/spv  dc planning, pt/fam ed, custom vs. off the shelf AFO, gait training   Communication   min-supervision A - d/c pt request  Supervision A conversation  education was completed   Safety/Cognition/ Behavioral Observations  min-supervision A - d/c pt request  Supervision A  education completed   Pain   Pt c/o chronic lower back pain rated 8/10  Remain <3  Assess pain qshift and PRN   Skin   Pt skin is intact  Remain free from impairments  Assess qshift and PRN     Discharge Planning:  Pt making good progress in therapies and motivated to continue his progress. He would like to stay here longer if can-will ask team   Team Discussion: Patient  doing well overall with poor carryover noted and resistant to recommendations post stroke.  Patient on target to meet rehab goals: yes, currently mod I sit - stand and able to ambulate up to 300' using a hemi walker and AFO with supervision.   *See Care Plan and progress notes for long and short-term goals.   Revisions to Treatment Plan:  Discharged from SLP services; increased PT/OT services Neuro re-education Quad cane trial   Teaching Needs: Safety, medications, transfers, etc.  Current Barriers to Discharge: Decreased caregiver support  Possible Resolutions to Barriers: HH follow up services recommended DME: TTB, hemi-walker     Medical Summary Current Status: continent of B/B- shoulder and back pain- using pain meds- Oxycodone; splint for high tone in RUE  Barriers to Discharge: Decreased family/caregiver support;Home enviroment access/layout;Insurance for SNF coverage;Weight;Medical stability;Medication compliance  Barriers to Discharge Comments: home wiht aunt Possible Resolutions to [employer] Focus: CGA-supervision with AFO- prefers hemiwalker- not quad cane- 300 ft at once- no stopping- stairs CGA- old CVA- 6 months ago- lots of neuromuscular ed and reistant to interventions- - frustrated easily- d/c 2/23- no carry over- d/c from SLP- due to wanting PT/OT only- bno carryover.   Continued Need for Acute Rehabilitation Level of Care: The patient requires daily medical management by a physician with specialized training in physical medicine and rehabilitation for the following reasons: Direction of a  multidisciplinary physical rehabilitation program to maximize functional independence : Yes Medical management of patient stability for increased activity during participation in an intensive rehabilitation regime.: Yes Analysis of laboratory values and/or radiology reports with any subsequent need for medication adjustment and/or medical intervention. : Yes   I attest that  I was present, lead the team conference, and concur with the assessment and plan of the team.   Chana Bode B 08/02/2021, 1:04 PM
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
## 2021-08-02 NOTE — Progress Notes (Signed)
Speech Language Pathology Discharge Summary  Patient Details  Name: PATE AYLWARD MRN: [id number] Date of Birth: 1976-01-12  Today's Date: 08/02/2021 SLP Individual Time: 0900-1000 SLP Individual Time Calculation (min): 60 min   Skilled Therapeutic Interventions:  Skilled ST services focused on education and cognitive skills. SLP facilitated reassessment on cognitive linguistic skills utilizing SLUMS, pt score 14/30 (n=>27) one point higher than on evaluation with a score of 13/30. Pt continues to demonstrate deficits in delayed processing impacting short term recall, basic problem solving, correction of errors and higher level attention. Pt supports while he continues to note acute changes in delayed processing and speech intelligibility he would like to end ST services while in CIR. Pt would like to focus on PT/OT services piror to hospital discharge and supports he is able to compensate for speech/cognitive deficits within this environment. SLP provided education with pt's caregiver, (aunt) Olivia Mackie via phone. Olivia Mackie was agreeable to continuing assisting with IADLs and some ADLs along with helping to provide 24 hour supervision A. SLP educated family on reduced safety awareness increasing fall risk and need for assistance with basic problem solving due to delayed processing. SLP provided memory strategies handout. All questions answered to satisfaction. Pt was left in room with call bell within reach and bed alarm set. SLP recommends to continue skilled services.    Patient has met 5 of 5 long term goals.  Patient to discharge at overall Supervision;Min level.  Reasons goals not met:     Clinical Impression/Discharge Summary:   Pt made good progress discharging at min-supervision A. Pt is near cognitive linguistic baseline per pt and pt's family, but pt continues to note acute changes in delayed processing and reduced speech intelligibly. Pt's barriers at discharge continue to be reduced safety  awareness, short term memory deficits, reduced basic-mildly complex problem solving and higher level attention. Pt requested to end ST services while in CIR to focus on PT/OT services. Education was completed with pt's mother and aunt for need to provide 24 hour supervision A due to barriers. Pt benefited from skilled ST services in order to maximize functional independence and reduce burden of care, requiring 24 hour supervision at discharge with continued HHST if patient desires.   Care Partner:  Caregiver Able to Provide Assistance: Yes  Type of Caregiver Assistance: Physical;Cognitive  Recommendation:  Home Health SLP;24 hour supervision/assistance  Rationale for SLP Follow Up: Maximize cognitive function and independence;Reduce caregiver burden;Maximize functional communication   Equipment: N/A   Reasons for discharge: Treatment goals met;Other (comment) (per pt request to focus on PT/OT piror to hosptial d/c)   Patient/Family Agrees with Progress Made and Goals Achieved: Yes    Ivee Poellnitz  [facility] 08/02/2021, 10:23 AM

## 2021-08-02 NOTE — Progress Notes (Signed)
Pt rested well through out the night. Did get hungry through the night and nurse provided bowl of cheerios and milk. Pt also requested Strawberry boost. Still complains of lower back pain throughout the shift relieved by prescribed PRN oxy.

## 2021-08-02 NOTE — Plan of Care (Signed)
°  Problem: Consults Goal: RH GENERAL PATIENT EDUCATION Description: See Patient Education module for education specifics. Outcome: Progressing   Problem: RH BOWEL ELIMINATION Goal: RH STG MANAGE BOWEL WITH ASSISTANCE Description: STG Manage Bowel with mod I  Assistance. Outcome: Progressing Goal: RH STG MANAGE BOWEL W/MEDICATION W/ASSISTANCE Description: STG Manage Bowel with Medication with mod I  Assistance. Outcome: Progressing   Problem: RH SAFETY Goal: RH STG ADHERE TO SAFETY PRECAUTIONS W/ASSISTANCE/DEVICE Description: STG Adhere to Safety Precautions With cues Assistance/Device. Outcome: Progressing   Problem: RH PAIN MANAGEMENT Goal: RH STG PAIN MANAGED AT OR BELOW PT'S PAIN GOAL Description: At or below level 4 with prns Outcome: Progressing   Problem: RH KNOWLEDGE DEFICIT GENERAL Goal: RH STG INCREASE KNOWLEDGE OF SELF CARE AFTER HOSPITALIZATION Description: Patient  and family will be able to manage care with medications and dietary modifications using handouts and educational materials independently  Outcome: Progressing

## 2021-08-02 NOTE — Progress Notes (Signed)
Recreational Therapy Session Note  Patient Details  Name: HIEU HERMS MRN: [id number] Date of Birth: 1975-11-12 Today's Date: 08/02/2021  Pain: no c/o Skilled Therapeutic Interventions/Progress Updates: Pt participated in TAG group with an emphasis on socialization, activity tolerance, dynamic balance, w/c mobility & UE use.  Activities included seated corn hole and UE die rolling game with UE exercises correlating to the number rolled on the dice.  Therapeutic use of music also included in this session.  Pt encouraging to other patients and participating in group conversations/education.  Session ended with relaxation training-deep breathing exercise for 2 minutes.  Therapy/Group: Group Therapy  Derrian Rodak 08/02/2021, 8:56 AM

## 2021-08-02 NOTE — Progress Notes (Signed)
PROGRESS NOTE   Subjective/Complaints:  Pt reports no issues and denies pain, but nursing note overnight reports pt having back pain- improved with oxycodone.   Really wants to stay longer- but explained inpt rehab is to get pt home safe and to continue to outpt- however pt has no insurance.    ROS:   Pt denies SOB, abd pain, CP, N/V/C/D, and vision changes    Objective:   No results found. Recent Labs    08/01/21 0546  WBC 2.9*  HGB 8.8*  HCT 28.9*  PLT 397   Recent Labs    08/01/21 0546  NA 133*  K 4.4  CL 102  CO2 18*  GLUCOSE 100*  BUN 17  CREATININE 0.88  CALCIUM 9.4     Intake/Output Summary (Last 24 hours) at 08/02/2021 0824 Last data filed at 08/02/2021 0334 Gross per 24 hour  Intake 888 ml  Output 575 ml  Net 313 ml        Physical Exam: Vital Signs Blood pressure 112/86, pulse (!) 104, temperature 97.9 F (36.6 C), temperature source Oral, resp. rate 18, height _0  (1.651 m), weight 53.7 kg, SpO2 97 %.      General: awake, alert, appropriate, sitting EOB;  NAD HENT: conjugate gaze; oropharynx moist CV: tachycardic rate- reg rhythm; no JVD Pulmonary: CTA B/L; no W/R/R- good air movement GI: soft, NT, ND, (+)BS Psychiatric: appropriate- less interactive this AM Neurological: alert- hard to understand due to dysarthria  Extremities: No clubbing, cyanosis, or edema Skin: No evidence of breakdown, no evidence of rash  Ext: no clubbing, cyanosis, or edema Psych: pleasant and cooperative  Neuro:  Alert and oriented x 3. Normal insight and awareness. Intact Memory. Normal language. Speech dysarthric. Left facial weakness. RUE 0/5. RLE 3- prox to 0/5 distally. Fair PROM. Decreased sensation to LT on right Musculoskeletal: right shoulder remains tight, some pain with PROM. Low back TTP.   Assessment/Plan: 1. Functional deficits which require 3+ hours per day of interdisciplinary  therapy in a comprehensive inpatient rehab setting. Physiatrist is providing close team supervision and 24 hour management of active medical problems listed below. Physiatrist and rehab team continue to assess barriers to discharge/monitor patient progress toward functional and medical goals  Care Tool:  Bathing  Bathing activity did not occur: Refused           Bathing assist       Upper Body Dressing/Undressing Upper body dressing Upper body dressing/undressing activity did not occur (including orthotics): Refused What is the patient wearing?: Pull over shirt    Upper body assist Assist Level: Minimal Assistance - Patient > 75%    Lower Body Dressing/Undressing Lower body dressing      What is the patient wearing?: Pants     Lower body assist Assist for lower body dressing: Minimal Assistance - Patient > 75%     Toileting Toileting    Toileting assist Assist for toileting: Minimal Assistance - Patient > 75%     Transfers Chair/bed transfer  Transfers assist     Chair/bed transfer assist level: Contact Guard/Touching assist     Locomotion Ambulation   Ambulation assist  Assist level: Contact Guard/Touching assist Assistive device: Walker-hemi Max distance: 165f   Walk 10 feet activity   Assist     Assist level: Contact Guard/Touching assist Assistive device: Walker-hemi   Walk 50 feet activity   Assist    Assist level: Contact Guard/Touching assist Assistive device: Walker-hemi    Walk 150 feet activity   Assist    Assist level: Contact Guard/Touching assist Assistive device: Walker-hemi    Walk 10 feet on uneven surface  activity   Assist Walk 10 feet on uneven surfaces activity did not occur: Safety/medical concerns         Wheelchair     Assist Is the patient using a wheelchair?: Yes Type of Wheelchair: Manual    Wheelchair assist level: Set up assist Max wheelchair distance: 150    Wheelchair 50  feet with 2 turns activity    Assist        Assist Level: Supervision/Verbal cueing   Wheelchair 150 feet activity     Assist      Assist Level: Supervision/Verbal cueing   Blood pressure 112/86, pulse (!) 104, temperature 97.9 F (36.6 C), temperature source Oral, resp. rate 18, height _0  (1.651 m), weight 53.7 kg, SpO2 97 %.  Medical Problem List and Plan: 1. 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  Antithrombotics: -DVT/anticoagulation:  Pharmaceutical: Lovenox             -antiplatelet therapy: ASA 3. Chronic LBP/Pain Management:   --Oxycodone 10 mg has been effective in the past-   -receiving 5-12mscheduled at night as well as prn --Lidocaine patches at nights (discomfort due to bed).  -voltaren gel for right shoulder pain 2/14- pain controlled- denies pain- con't regimen 2/21- denies pain, but using meds, mainly with therapy and at night- con't regimen 4. Mood: LCSW to follow for evaluation and support.              -antipsychotic agents: N/A 5. Neuropsych: This patient is  capable of making decisions on his own behalf. 6. Skin/Wound Care: Routine pressure relief measures.  7. Fluids/Electrolytes/Nutrition/FTT: encourage po  -I personally reviewed the patient's labs today.    2/9 mild hyponatremia--recheck Monday  2/20- Na 133- will con't to monitor  -remeron/megace to help with appetite  -on D3 diet due to poorly fitting dentures  -intake is fair/inconsistent--continue to encourage 8. PML/Disseminated MAC: Continue Zithromax and Ethambutol.  9. 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 Continue Biktarvy             --on Bactrim for PJP prophylaxis.  10.  Pancytopenia: Neutropenia with WBC stable at 2.8K             -anemia slightly improved at 9.1       11. Insomnia: Encouraged use of Remeron and Seroquel also to help with sleep.  Increased Remeron to 1573m         improved 2/17  2/21- improved sleep- con't regimen 12. Tachycardia: Monitor HR/BP TID- Monitor HR with activity, EKG with sinus tach     Vitals:   08/02/21 0530  08/02/21 0538  BP: 109/84 112/86  Pulse: 89 (!) 104  Resp: 14 18  Temp: 98.2 F (36.8 C) 97.9 F (36.6 C)  SpO2: 100% 97%  BPs on low side so hesistant in starting BB, asymptomatic, may have deconditioning component, asymptomatic asymptomatic- so con't off BB  2/21- HR still elevated in low 100s- no BB since asymptomatic- con't regimen 14. Abnormal LFTs: Continue to hold Lipitor. Will hold motrin also for now.  -  LFTs stable, sl improved 2/9.   15. COPD: Continue MDI. No wheezing   I spent a total of  35  minutes on total care today- >50% coordination of care- due to team conference and prolonged d/w pt about extension.      LOS: 13 days A FACE TO FACE EVALUATION WAS PERFORMED  Caleah Tortorelli 08/02/2021, 8:24 AM

## 2021-08-02 NOTE — Progress Notes (Signed)
Physical Therapy Session Note  Patient Details  Name: Kurt Foley MRN: [id number] Date of Birth: [birth year]  Today's Date: 08/02/2021 PT Individual Time: 1030-1130 PT Individual Time Calculation (min): 60 min   Short Term Goals: Week 2:  PT Short Term Goal 1 (Week 2): STG=LTG based on ELOS  Skilled Therapeutic Interventions/Progress Updates:    Patient received asleep in bed, easy to wake and agreeable to PT. He denies pain. Patient coming to sit edge of bed MOdI. PT donning sneakers with AFO for time management. He ambulated ~140ft to therapy gym with [facility] and supervision. Biodex used to encourage weight shift R with visual feedback on weight distribution. Patient remains with a very difficult time shifting weight to the R despite visual feedback and tactile/manual facilitation. Patient with resultant R knee flexion when shifting weight onto R leg despite adequate strength to keep R knee in extension. Patient completed further NMR tasks to encourage full weight shift R, including tapping L foot to target and then progressing to tapping R foot to elevated target. Patient ambulating ot dayroom where he completed 5 mins on NuStep with B LE only. Patient ambulating ~351ft back to his room with [facility] and supervision. Remaining edge of bed, bed alarm on, call light within reach.   Therapy Documentation Precautions:  Precautions Precautions: Fall Precaution Comments: R side hemi Required Braces or Orthoses: Splint/Cast Splint/Cast: aircast on R ankle, R resting hand splint (resting hand splint not in room at time of PT eval on 2/9) Restrictions Weight Bearing Restrictions: No    Therapy/Group: Individual Therapy  Elizebeth Koller, PT, DPT, CBIS  08/02/2021, 7:28 AM

## 2021-08-02 NOTE — Progress Notes (Signed)
Nutrition Follow-up  DOCUMENTATION CODES:   Non-severe (moderate) malnutrition in context of chronic illness  INTERVENTION:  Continue Ensure Enlive po TID, each supplement provides 350 kcal and 20 grams of protein.  Encourage PO intake.   NUTRITION DIAGNOSIS:   Moderate Malnutrition related to chronic illness (HIV/AIDS) as evidenced by moderate fat depletion, moderate muscle depletion; ongoing  GOAL:   Patient will meet greater than or equal to 90% of their needs; met  MONITOR:   PO intake, Supplement acceptance, Weight trends, Labs  REASON FOR ASSESSMENT:   Malnutrition Screening Tool    ASSESSMENT:   Pt is a 46 y.o. male with past medical history significant of CVA with right-sided hemiparesis, history of HIV/AIDS, last CD4 less than 35, disseminated MAC, was recently discharged from [facility] after biopsy of the brain showed JC virus /PML, his symptoms worsened with increased weakness and slurred speech and he was admitted to [facility] with FTT. Pt recently admitted to inpatient rehab (CIR) for functional decline due to MAC/PML in the setting of AIDS.  Meal completion has been 50-100% with 100% intake today. Pt reports good appetite. Pt has been tolerating his PO diet well. Pt currently has ensure ordered and has been consuming them. RD to continue with current orders to aid in caloric and protein needs. Labs and medications reviewed.   Diet Order:   Diet Order             DIET DYS 3 Room service appropriate? Yes; Fluid consistency: Thin  Diet effective now                   EDUCATION NEEDS:   Education needs have been addressed  Skin:  Skin Assessment: Reviewed RN Assessment  Last BM:  2/21  Height:   Ht Readings from Last 1 Encounters:  07/20/21 _0  (1.651 m)    Weight:   Wt Readings from Last 1 Encounters:  07/20/21 53.7 kg    Ideal Body Weight:  61.8 kg  BMI:  Body mass index is 19.7 kg/m.  Estimated Nutritional Needs:    Kcal:  [phone]  Protein:  90 - 105 grams  Fluid:  >/= 1.8 L  Corrin Parker, MS, RD, LDN RD pager number/after hours weekend pager number on Amion.

## 2021-08-03 LAB — CULTURE, FUNGUS WITHOUT SMEAR

## 2021-08-03 NOTE — Progress Notes (Signed)
Occupational Therapy Session Note  Patient Details  Name: Kurt Foley MRN: [id number] Date of Birth: 17-May-1976  Today's Date: 08/03/2021 OT Individual Time: 0930-1015 OT Individual Time Calculation (min): 45 min    Short Term Goals: Week 1:  OT Short Term Goal 1 (Week 1): STG=LTG 2/2 ELOS Week 2:  OT Short Term Goal 1 (Week 2): continuation of LTGs    Skilled Therapeutic Interventions/Progress Updates:    Pt received in bed but declined b/d stating he did not need a bath. Pt had his R AFO on. Had pt come to EOB and ambulate a few fee to w/c with hemiwalker. Pt continues to need cues for foot placement (he often is unaware of R foot position), hand placement for the safest sit >< stands.   Pt taken to ADL apt to practice tub transfers to get into tub to his L - in the same direction as he will use at home.  Close S with transfers.  Demonstrated how to set up with shower curtain to avoid water spillage.  Pt then transported to gym to transfer to mat to focus on HEP for RUE at home.  Worked on weight bearing, SROM, A/arom with L helping R.  Will provide pt printed handout of HEP tomorrow.  Unfortunately, no change in tone or muscular control of RUE during his stay.   Pt returned to room and transferred back to bed to wait for his next therapist.  Bed alarm set.   Therapy Documentation Precautions:  Precautions Precautions: Fall Precaution Comments: R side hemi Required Braces or Orthoses: Splint/Cast Splint/Cast: aircast on R ankle, R resting hand splint (resting hand splint not in room at time of PT eval on 2/9) Restrictions Weight Bearing Restrictions: No     Pain: Pain Assessment Pain Scale: 0-10 Pain Score: 0-No pain ADL: ADL Eating: Set up Grooming: Setup Upper Body Bathing: Minimal assistance Where Assessed-Upper Body Bathing: Shower Lower Body Bathing: Minimal assistance Where Assessed-Lower Body Bathing: Shower Upper Body Dressing: Minimal  assistance Lower Body Dressing: Minimal assistance Where Assessed-Lower Body Dressing: Edge of bed Toileting: Minimal assistance Where Assessed-Toileting: Teacher, adult education: Curator Method: Insurance claims handler: Not assessed Film/video editor: Administrator, arts Method: Designer, industrial/product: Emergency planning/management officer, Grab bars ADL Comments: Patient declined bathing at time of inital eval.   Therapy/Group: Individual Therapy  Diva Lemberger 08/03/2021, 11:02 AM

## 2021-08-03 NOTE — Progress Notes (Signed)
Physical Therapy Session Note  Patient Details  Name: Kurt Foley MRN: [id number] Date of Birth: [birth year]  Today's Date: 08/03/2021 PT Individual Time: 1400-1500 PT Individual Time Calculation (min): 60 min   Short Term Goals: Week 2:  PT Short Term Goal 1 (Week 2): STG=LTG based on ELOS  Skilled Therapeutic Interventions/Progress Updates:     Patient in bed upon PT arrival. Patient alert and agreeable to PT session. Patient denied pain during session.  Per lead PT, patient with concerns of reduced stability with person hemi-walker that was delivered. Noted increased instability due to the type of grips on the patient's walker. Switched bottom grips from CIR hemi-walker to patient's with improved stability. Patient use his personal hemi-walker during session, reporting that the instability was resolved. Patient very appreciative.  Therapeutic Activity: Bed Mobility: Patient performed supine to sit independently.  Transfers: Patient performed sit to/from stand from the bed, recliner, and ADL couch with mod I using hemi-walker and sit to/form stand without AD with CGA-close supervision. Provided verbal cues for equal weight bearing and forward weight shift without AD.  Gait Training:  Patient ambulated multiple gait trials for assessment of AD and gait training without AD, R AFO donned throughout: Trial 1: ambulated 200 feet, first 100 feet using hemi-walker with supervision, second 100 feet with L HHA with min A Trial 2: ambulated 116 feet without AD or upper extremity support with min A for weight shifting and forward propulsion to increased initiation of R swing phase and improved gait symmetry, PT facilitated patient from in front to improve patient's confidence with mobility Trial 3 and 4: ambulated >125 feet x2 using hemi-walker with distant supervision-mod I Focused on increased hamstring activation in R swing phase and increased lateral hip and eccentric hamstring activation  in R stance phase for improved knee and hip stability.  Patient required seated rest breaks between gait trials. Educated patient on rehabilitation continuum and progression from AD, patient reports increased confidence with gait and eager to continue to progress with OP therapies.  Patient sitting EOB at end of session with breaks locked, bed alarm set, and all needs within reach.   Therapy Documentation Precautions:  Precautions Precautions: Fall Precaution Comments: R side hemi Required Braces or Orthoses: Splint/Cast Splint/Cast: aircast on R ankle, R resting hand splint (resting hand splint not in room at time of PT eval on 2/9) Restrictions Weight Bearing Restrictions: No   Therapy/Group: Individual Therapy  Parveen Freehling L Kailan Laws PT, DPT  08/03/2021, 4:28 PM

## 2021-08-03 NOTE — Progress Notes (Signed)
Physical Therapy Session Note  Patient Details  Name: Kurt Foley MRN: [id number] Date of Birth: 02-21-1976  Today's Date: 08/03/2021 PT Individual Time: 1020-1105 PT Individual Time Calculation (min): 45 min   Short Term Goals: Week 2:  PT Short Term Goal 1 (Week 2): STG=LTG based on ELOS  Skilled Therapeutic Interventions/Progress Updates: Pt presented in bed agreeable to therapy. Pt denies pain but states concerned about pending d/c. PTA discussed with pt current functional level with pt agreeable to work on activities in ADL apt to simulate home. Pt performed bed mobility mod I and ambulated with supervision to ADL apt. Once pt sitting on couch voiced concern that he still wasn't comfortable with stairs. Pt then ambulated in same manner to rehab gym and after brief rest participated in ascending/descending 6in steps with L rail only. Pt initially required cues for sequencing and min cues for adequately placing RLE on step when ascending however was able to improve on second and third bouts. Pt was able to produce enough hip flexion for RLE to reach step. Pt was minA on first bout but CGA for both ascending and descending on remaining bouts. Pt then ambulated back to room at supervision level and left seated EOB with bed alarm on, call bell within reach and needs met.      Therapy Documentation Precautions:  Precautions Precautions: Fall Precaution Comments: R side hemi Required Braces or Orthoses: Splint/Cast Splint/Cast: aircast on R ankle, R resting hand splint (resting hand splint not in room at time of PT eval on 2/9) Restrictions Weight Bearing Restrictions: No General:   Vital Signs: Therapy Vitals Temp: 98.5 F (36.9 C) Temp Source: Oral Pulse Rate: (!) 105 Resp: 18 BP: 108/84 Patient Position (if appropriate): Lying Oxygen Therapy SpO2: 97 % O2 Device: Room Air Pain:   Mobility:   Locomotion :    Trunk/Postural Assessment :    Balance:   Exercises:    Other Treatments:      Therapy/Group: Individual Therapy  Rayn Shorb 08/03/2021, 4:23 PM

## 2021-08-03 NOTE — Progress Notes (Signed)
Physical Therapy Session Note  Patient Details  Name: Kurt Foley MRN: [id number] Date of Birth: 1976/01/30  Today's Date: 08/03/2021 PT Individual Time: 0800-0900 PT Individual Time Calculation (min): 60 min   Short Term Goals: Week 2:  PT Short Term Goal 1 (Week 2): STG=LTG based on ELOS  Skilled Therapeutic Interventions/Progress Updates:    Patient received supine in bed, asleep, easy to wake and agreeable to PT. He denies pain. PT providing patient with wet washcloth to aid in waking up. RN providing AM rx. PT donning sneakers + AFO TotalA for time. He ambulated to therapy gym with [facility] and supervision. Requesting to complete "just exercise." 3x10 of the following therex with 2.5# ankle weight R LE and 4# ankle weight L LE: LAQ, marches, HSC, hib abduction, hooklying bridges. Patient ambulating ~339ft with HW and supervision. He reports that he feels as though the new HW is making his R LE "shake." Patient with ambulatory transfer to toilet with supervision. ModI in bathroom. Remaining up on edge of bed, bed alarm on, call light within reach.   Therapy Documentation Precautions:  Precautions Precautions: Fall Precaution Comments: R side hemi Required Braces or Orthoses: Splint/Cast Splint/Cast: aircast on R ankle, R resting hand splint (resting hand splint not in room at time of PT eval on 2/9) Restrictions Weight Bearing Restrictions: No   Therapy/Group: Individual Therapy  Elizebeth Koller, PT, DPT, CBIS  08/03/2021, 7:38 AM

## 2021-08-03 NOTE — Progress Notes (Signed)
Recreational Therapy Discharge Summary Patient Details  Name: HAARIS METALLO MRN: [id number] Date of Birth: [birth year] Today's Date: 08/03/2021  Long term goals set: 1  Long term goals met: 1  Comments on progress toward goals: Pt has made good progress during LOS and is scheduled for discharge home with family on 2/24.  TR sessions focused on pt education including activity analysis/modifications, energy conservation, community reintegration and home safety.  Pt also participated in TAG group at supervision level for mod complex leisure tasks.  Pt states continued motivation to work hard and regain further independence.  Reasons goals not met: n/a  Equipment acquired: n/a  Reasons for discharge: discharge from hospital  Follow-up: [place] agrees with progress made and goals achieved: Yes  Calandria Mullings 08/03/2021, 11:44 AM

## 2021-08-03 NOTE — Progress Notes (Signed)
PROGRESS NOTE   Subjective/Complaints:  Pt reports no issues- disappointed can't stay longer- explained that he's walking >314f with hemiwalker and is safe for d/c by Friday.    ROS:   Pt denies SOB, abd pain, CP, N/V/C/D, and vision changes     Objective:   No results found. Recent Labs    08/01/21 0546  WBC 2.9*  HGB 8.8*  HCT 28.9*  PLT 397   Recent Labs    08/01/21 0546  NA 133*  K 4.4  CL 102  CO2 18*  GLUCOSE 100*  BUN 17  CREATININE 0.88  CALCIUM 9.4     Intake/Output Summary (Last 24 hours) at 08/03/2021 0835 Last data filed at 08/03/2021 0659 Gross per 24 hour  Intake 837 ml  Output 1100 ml  Net -263 ml        Physical Exam: Vital Signs Blood pressure 109/80, pulse 99, temperature 98.5 F (36.9 C), temperature source Oral, resp. rate 16, height _0  (1.651 m), weight 53.7 kg, SpO2 100 %.       General: awake, alert, appropriate, NAD HENT: conjugate gaze; oropharynx moist CV: regular but borderline tachycardic rate; no JVD Pulmonary: CTA B/L; no W/R/R- good air movement GI: soft, NT, ND, (+)BS Psychiatric: appropriate- sad Neurological: alert- R sided spasticity- in splint in RUE Extremities: No clubbing, cyanosis, or edema Skin: No evidence of breakdown, no evidence of rash  Ext: no clubbing, cyanosis, or edema Psych: pleasant and cooperative  Neuro:  Alert and oriented x 3. Normal insight and awareness. Intact Memory. Normal language. Speech dysarthric. Left facial weakness. RUE 0/5. RLE 3- prox to 0/5 distally. Fair PROM. Decreased sensation to LT on right Musculoskeletal: right shoulder remains tight, some pain with PROM. Low back TTP.   Assessment/Plan: 1. Functional deficits which require 3+ hours per day of interdisciplinary therapy in a comprehensive inpatient rehab setting. Physiatrist is providing close team supervision and 24 hour management of active medical  problems listed below. Physiatrist and rehab team continue to assess barriers to discharge/monitor patient progress toward functional and medical goals  Care Tool:  Bathing  Bathing activity did not occur: Refused           Bathing assist       Upper Body Dressing/Undressing Upper body dressing Upper body dressing/undressing activity did not occur (including orthotics): Refused What is the patient wearing?: Pull over shirt    Upper body assist Assist Level: Minimal Assistance - Patient > 75%    Lower Body Dressing/Undressing Lower body dressing      What is the patient wearing?: Pants     Lower body assist Assist for lower body dressing: Minimal Assistance - Patient > 75%     Toileting Toileting    Toileting assist Assist for toileting: Minimal Assistance - Patient > 75%     Transfers Chair/bed transfer  Transfers assist     Chair/bed transfer assist level: Contact Guard/Touching assist     Locomotion Ambulation   Ambulation assist      Assist level: Contact Guard/Touching assist Assistive device: Walker-hemi Max distance: 1869f  Walk 10 feet activity   Assist     Assist level:  Contact Guard/Touching assist Assistive device: Walker-hemi   Walk 50 feet activity   Assist    Assist level: Contact Guard/Touching assist Assistive device: Walker-hemi    Walk 150 feet activity   Assist    Assist level: Contact Guard/Touching assist Assistive device: Walker-hemi    Walk 10 feet on uneven surface  activity   Assist Walk 10 feet on uneven surfaces activity did not occur: Safety/medical concerns         Wheelchair     Assist Is the patient using a wheelchair?: Yes Type of Wheelchair: Manual    Wheelchair assist level: Set up assist Max wheelchair distance: 150    Wheelchair 50 feet with 2 turns activity    Assist        Assist Level: Supervision/Verbal cueing   Wheelchair 150 feet activity     Assist       Assist Level: Supervision/Verbal cueing   Blood pressure 109/80, pulse 99, temperature 98.5 F (36.9 C), temperature source Oral, resp. rate 16, height _0  (1.651 m), weight 53.7 kg, SpO2 100 %.  Medical Problem List and Plan: 1. 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  Antithrombotics: -DVT/anticoagulation:  Pharmaceutical: Lovenox             -antiplatelet therapy: ASA 3. Chronic LBP/Pain Management:   --Oxycodone 10 mg has been effective in the past-   -receiving 5-60m scheduled at night as well as prn --Lidocaine patches at nights (discomfort due to bed).  -voltaren gel for right shoulder pain 2/21- denies pain, but using meds, mainly with therapy and at night- con't regimen  2/22- taking pain meds regularly- will d/c Friday- will con't meds- but will need to get from PCP after d/c. 4. Mood: LCSW to follow for evaluation and support.              -antipsychotic agents: N/A 5. Neuropsych: This patient is  capable of making decisions on his own behalf. 6. Skin/Wound Care: Routine pressure relief measures.  7. Fluids/Electrolytes/Nutrition/FTT: encourage po  -I personally reviewed the patient's labs today.    2/9 mild hyponatremia--recheck Monday  2/20- Na 133- will con't to monitor  -remeron/megace to help with appetite  -on D3 diet due to poorly fitting dentures  -intake is fair/inconsistent--continue to encourage 8. PML/Disseminated MAC: Continue Zithromax and Ethambutol.  9. 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 Continue Biktarvy             --on Bactrim for PJP prophylaxis.  10. Pancytopenia: Neutropenia with WBC stable at 2.8K             -anemia slightly improved at 9.1       11. Insomnia: Encouraged use of Remeron and Seroquel also to help with sleep.   Increased Remeron to 178m          improved 2/17  2/21- improved sleep- con't regimen 12. Tachycardia: Monitor HR/BP TID- Monitor HR with activity, EKG with sinus tach     Vitals:   08/03/21 0050 08/03/21 0445  BP: 111/88 109/80  Pulse: 75 99  Resp: 16 16  Temp: 98 F (36.7 C) 98.5 F (36.9 C)  SpO2: 100% 100%  BPs on low side so hesistant in starting BB, asymptomatic, may have deconditioning component, asymptomatic asymptomatic- so con't off BB  2/22- pulse better this AM- 75-99- con't off BB 14. Abnormal LFTs: Continue to hold Lipitor. Will hold motrin also for now.  -  LFTs stable, sl improved 2/9.   15. COPD: Continue MDI. No wheezing  16. Dispo 2/22- will likely best be helped by Dr Letta Pate or Dr Ranell Patrick at d/c for Botox possible of R side. Since Dr Letta Pate has seen pt- will arrange f/u with him, if it's OK with him     LOS: 14 days A FACE TO FACE EVALUATION WAS PERFORMED  Nivea Wojdyla 08/03/2021, 8:35 AM

## 2021-08-04 NOTE — Plan of Care (Signed)
Problem: RH Furniture Transfers Goal: LTG Patient will perform furniture transfers w/assist (OT/PT) Description: LTG: Patient will perform furniture transfers  with assistance (OT/PT). Outcome: Completed/Met   Problem: RH Balance Goal: LTG Patient will maintain dynamic standing balance (PT) Description: LTG:  Patient will maintain dynamic standing balance with assistance during mobility activities (PT) Outcome: Completed/Met   Problem: Sit to Stand Goal: LTG:  Patient will perform sit to stand with assistance level (PT) Description: LTG:  Patient will perform sit to stand with assistance level (PT) Outcome: Completed/Met   Problem: RH Bed to Chair Transfers Goal: LTG Patient will perform bed/chair transfers w/assist (PT) Description: LTG: Patient will perform bed to chair transfers with assistance (PT). Outcome: Completed/Met   Problem: RH Car Transfers Goal: LTG Patient will perform car transfers with assist (PT) Description: LTG: Patient will perform car transfers with assistance (PT). Outcome: Completed/Met   Problem: RH Ambulation Goal: LTG Patient will ambulate in controlled environment (PT) Description: LTG: Patient will ambulate in a controlled environment, # of feet with assistance (PT). Outcome: Completed/Met Goal: LTG Patient will ambulate in home environment (PT) Description: LTG: Patient will ambulate in home environment, # of feet with assistance (PT). Outcome: Completed/Met   Problem: RH Stairs Goal: LTG Patient will ambulate up and down stairs w/assist (PT) Description: LTG: Patient will ambulate up and down # of stairs with assistance (PT) Outcome: Completed/Met

## 2021-08-04 NOTE — Progress Notes (Signed)
Patient ID: Kurt Foley, male   DOB: 1975/06/14, 46 y.o.   MRN: [id number]  Have tried to get pt charity home health and have not been successful. Therapy team to give pt home exercise program to follow at home.

## 2021-08-04 NOTE — Progress Notes (Signed)
Physical Therapy Discharge Summary  Patient Details  Name: Kurt Foley MRN: [id number] Date of Birth: 1975/09/27  Today's Date: 08/04/2021 PT Individual Time: 1100-1200; 1415-1530 PT Individual Time Calculation (min): 60 min and 75 mins Session 1: Patient received supine in bed, agreeable to PT. He denies pain. Patient ambulating to ortho gym with [facility] and supervision. He was able to transfer into the car with supervision. Patient negotiating 12 steps with L HR and supervision, step-to. Patient ambulating to dayroom with HW and supervision. X10 mins on the NuStep for LE strengthening. Patient ambulating back to his room with [facility] and supervision. He remained edge of bed, bed alarm on, call light within reach.   Session 2: Patient received supine in bed, asleep, easy to wake and agreeable to PT. He denies pain. Patient requesting to go outside. Ambulatory transfer to wc. PT transporting patient outside in wc for time management and energy conservation. [facility] ~161f [facility]. Patient with slower gait speed, but safe ambulation. Patient requesting to practice going up and down the stairs without holding onto the handrail. PT educating patient on importance of using handrail if present for balance and emphasized only going up and down steps when someone else is present in the house. Patient verbalized understanding. Patient then requesting to complete more time on the "bike." X10 mins on the NuStep. Patient requesting to pedal backwards. PT providing patient with pedal bike. 2x5 forward, 2x5 backward. Patient returning to room in wc, ambulatory transfer to bed. Bed alarm on, call light within reach.   Patient has met 8 of 8 long term goals due to improved activity tolerance, improved balance, improved postural control, increased strength, ability to compensate for deficits, and functional use of  right lower extremity.  Patient to discharge at an ambulatory level  Supervision.   Patient's care partner is independent to provide the necessary physical and cognitive assistance at discharge. Family declined to participate in family education prior to dc.    Recommendation:  Patient will benefit from ongoing skilled PT services in home health setting to continue to advance safe functional mobility, address ongoing impairments in dynamic balance, gait progressions, and minimize fall risk.  Equipment: hemiwalker  Reasons for discharge: treatment goals met and discharge from hospital  Patient/family agrees with progress made and goals achieved: Yes  PT Discharge Precautions/Restrictions Precautions Precautions: Fall Precaution Comments: R side hemi Required Braces or Orthoses: Splint/Cast Splint/Cast: R resting hand splint Restrictions Weight Bearing Restrictions: No Vital Signs Therapy Vitals Temp: 97.8 F (36.6 C) Pulse Rate: 100 Resp: 15 BP: 119/85 Patient Position (if appropriate): Lying Oxygen Therapy SpO2: 99 % O2 Device: Room Air Pain Pain Assessment Pain Scale: 0-10 Pain Score: 0-No pain Pain Type: Chronic pain Pain Location: Back Pain Orientation: Lower;Medial Pain Descriptors / Indicators: Aching Pain Frequency: Constant Pain Onset: Progressive Patients Stated Pain Goal: 3 Pain Intervention(s): Medication (See eMAR) Pain Interference Pain Interference Pain Effect on Sleep: 0. Does not apply - I have not had any pain or hurting in the past 5 days Pain Interference with Therapy Activities: 0. Does not apply - I have not received rehabilitationtherapy in the past 5 days Pain Interference with Day-to-Day Activities: 1. Rarely or not at all Vision/Perception  Vision - History Ability to See in Adequate Light: 0 Adequate Perception Perception: Within Functional Limits Praxis Praxis: Intact  Cognition Overall Cognitive Status: History of cognitive impairments - at baseline Arousal/Alertness: Awake/alert Orientation Level:  Oriented X4 Year: 2023 Month: February  Day of Week: Correct Sustained Attention: Appears intact Selective Attention: Impaired Alternating Attention: Impaired Memory: Impaired Immediate Memory Recall: Sock;Blue;Bed Memory Recall Sock: Without Cue Memory Recall Blue: With Cue Memory Recall Bed: With Cue Awareness: Impaired Awareness Impairment: Anticipatory impairment Problem Solving: Impaired Problem Solving Impairment: Functional basic Organizing: Impaired Safety/Judgment: Impaired Sensation Sensation Light Touch: Impaired Detail Light Touch Impaired Details: Impaired RUE;Impaired RLE Hot/Cold: Impaired by gross assessment Proprioception: Impaired by gross assessment Stereognosis: Impaired by gross assessment Coordination Gross Motor Movements are Fluid and Coordinated: No Fine Motor Movements are Fluid and Coordinated: No Coordination and Movement Description: Hx of CVA with R residual weakness Finger Nose Finger Test: Unable on R. WFL on L. Heel Shin Test: slightly limited ROM and decreased coordination R LE Motor  Motor Motor: Hemiplegia;Abnormal tone Motor - Skilled Clinical Observations: Tone present in RUE/RLE Motor - Discharge Observations: continues to have hypertone in RUE/RLE  Mobility Bed Mobility Bed Mobility: Rolling Right;Rolling Left;Supine to Sit;Sit to Supine Rolling Right: Independent Rolling Left: Independent Supine to Sit: Independent Sit to Supine: Independent Transfers Transfers: Sit to Stand;Stand to Sit;Stand Pivot Transfers Sit to Stand: Supervision/Verbal cueing Stand to Sit: Supervision/Verbal cueing Stand Pivot Transfers: Supervision/Verbal cueing Transfer (Assistive device): Hemi-walker Locomotion  Gait Ambulation: Yes Gait Assistance: Supervision/Verbal cueing;Independent with assistive device Gait Distance (Feet): 350 Feet Assistive device: Hemi-walker Gait Gait: Yes Gait Pattern: Impaired Gait Pattern: Ataxic;Step-to  pattern;Decreased stride length;Right circumduction;Lateral trunk lean to left Gait velocity: decr Stairs / Additional Locomotion Stairs: Yes Stairs Assistance: Supervision/Verbal cueing Stair Management Technique: One rail Left Number of Stairs: 12 Height of Stairs: 6 Ramp: Supervision/Verbal cueing Curb: Supervision/Verbal cueing Pick up small object from the floor assist level: Supervision/Verbal cueing Pick up small object from the floor assistive device: reacher Wheelchair Mobility Wheelchair Mobility: No  Trunk/Postural Assessment  Cervical Assessment Cervical Assessment: Exceptions to [facility] Thoracic Assessment Thoracic Assessment: Exceptions to [facility] Lumbar Assessment Lumbar Assessment: Exceptions to [facility] Postural Control Postural Control: Deficits on evaluation Righting Reactions: Delayed  Balance Balance Balance Assessed: Yes Static Sitting Balance Static Sitting - Balance Support: No upper extremity supported;Feet supported Static Sitting - Level of Assistance: 7: Independent Dynamic Sitting Balance Dynamic Sitting - Balance Support: Feet supported;Left upper extremity supported Dynamic Sitting - Level of Assistance: 7: Independent Static Standing Balance Static Standing - Balance Support: During functional activity;Left upper extremity supported Static Standing - Level of Assistance: 6: Modified independent (Device/Increase time) Dynamic Standing Balance Dynamic Standing - Balance Support: Left upper extremity supported;During functional activity Dynamic Standing - Level of Assistance: 5: Stand by assistance Extremity Assessment  RUE Assessment Passive Range of Motion (PROM) Comments: WFL Active Range of Motion (AROM) Comments: only trace scapular retraction and sh flex/abd RUE Body System: Neuro Brunstrum levels for arm and hand: Arm;Hand Brunstrum level for arm: Stage II Synergy is developing Brunstrum level for hand: Stage I Flaccidity RUE Tone RUE Tone:  Hypertonic;Moderate Hypertonic Details: subluxation of 1 1/2 finger width, tone pulls humeral head into anterior position,  mod hypertone in elbow, wrist and finger flexors LUE Assessment LUE Assessment: Within Functional Limits RLE Assessment RLE Assessment: Exceptions to [facility] RLE Strength RLE Overall Strength: Deficits;Due to premorbid status RLE Overall Strength Comments: Grossly 3+-4-/5 Right Ankle Dorsiflexion: 1/5 Right Ankle Plantar Flexion: 1/5 LLE Assessment LLE Assessment: Within Functional Limits LLE Strength LLE Overall Strength: Within Functional Limits for tasks assessed LLE Overall Strength Comments: Grossly 4+/5    Debbora Dus 08/04/2021, 11:38 AM

## 2021-08-04 NOTE — Progress Notes (Signed)
PROGRESS NOTE   Subjective/Complaints:  Pt reports no issues.  Just waking up.  LBM yesterday.    ROS:   Pt denies SOB, abd pain, CP, N/V/C/D, and vision changes     Objective:   No results found. No results for input(s): WBC, HGB, HCT, PLT in the last 72 hours.  No results for input(s): NA, K, CL, CO2, GLUCOSE, BUN, CREATININE, CALCIUM in the last 72 hours.    Intake/Output Summary (Last 24 hours) at 08/04/2021 0836 Last data filed at 08/04/2021 0335 Gross per 24 hour  Intake 360 ml  Output 1225 ml  Net -865 ml        Physical Exam: Vital Signs Blood pressure 119/85, pulse 100, temperature 97.8 F (36.6 C), resp. rate 15, height _0  (1.651 m), weight 56.7 kg, SpO2 99 %.         General: awake, alert, appropriate, laying supine in bed; just woke up;  NAD HENT: conjugate gaze; oropharynx moist CV: regular rate; no JVD Pulmonary: CTA B/L; no W/R/R- good air movement GI: soft, NT, ND, (+)BS Psychiatric: appropriate Neurological: alert, but sleepy- RUE splint- MAS of 2 in RUE esp wrist/hand Extremities: No clubbing, cyanosis, or edema Skin: No evidence of breakdown, no evidence of rash  Ext: no clubbing, cyanosis, or edema Psych: pleasant and cooperative  Neuro:  Alert and oriented x 3. Normal insight and awareness. Intact Memory. Normal language. Speech dysarthric. Left facial weakness. RUE 0/5. RLE 3- prox to 0/5 distally. Fair PROM. Decreased sensation to LT on right Musculoskeletal: right shoulder remains tight, some pain with PROM. Low back TTP.   Assessment/Plan: 1. Functional deficits which require 3+ hours per day of interdisciplinary therapy in a comprehensive inpatient rehab setting. Physiatrist is providing close team supervision and 24 hour management of active medical problems listed below. Physiatrist and rehab team continue to assess barriers to discharge/monitor patient progress  toward functional and medical goals  Care Tool:  Bathing  Bathing activity did not occur: Refused           Bathing assist       Upper Body Dressing/Undressing Upper body dressing Upper body dressing/undressing activity did not occur (including orthotics): Refused What is the patient wearing?: Pull over shirt    Upper body assist Assist Level: Minimal Assistance - Patient > 75%    Lower Body Dressing/Undressing Lower body dressing      What is the patient wearing?: Pants     Lower body assist Assist for lower body dressing: Minimal Assistance - Patient > 75%     Toileting Toileting    Toileting assist Assist for toileting: Minimal Assistance - Patient > 75%     Transfers Chair/bed transfer  Transfers assist     Chair/bed transfer assist level: Contact Guard/Touching assist     Locomotion Ambulation   Ambulation assist      Assist level: Contact Guard/Touching assist Assistive device: Walker-hemi Max distance: 124f   Walk 10 feet activity   Assist     Assist level: Contact Guard/Touching assist Assistive device: Walker-hemi   Walk 50 feet activity   Assist    Assist level: Contact Guard/Touching assist Assistive device: Walker-hemi  Walk 150 feet activity   Assist    Assist level: Contact Guard/Touching assist Assistive device: Walker-hemi    Walk 10 feet on uneven surface  activity   Assist Walk 10 feet on uneven surfaces activity did not occur: Safety/medical concerns         Wheelchair     Assist Is the patient using a wheelchair?: Yes Type of Wheelchair: Manual    Wheelchair assist level: Set up assist Max wheelchair distance: 150    Wheelchair 50 feet with 2 turns activity    Assist        Assist Level: Supervision/Verbal cueing   Wheelchair 150 feet activity     Assist      Assist Level: Supervision/Verbal cueing   Blood pressure 119/85, pulse 100, temperature 97.8 F (36.6 C),  resp. rate 15, height _0  (1.651 m), weight 56.7 kg, SpO2 99 %.  Medical Problem List and Plan: 1. 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  Antithrombotics: -DVT/anticoagulation:  Pharmaceutical: Lovenox             -antiplatelet therapy: ASA 3. Chronic LBP/Pain Management:   --Oxycodone 10 mg has been effective in the past-   -receiving 5-70m scheduled at night as well as prn --Lidocaine patches at nights (discomfort due to bed).  -voltaren gel for right shoulder pain 2/21- denies pain, but using meds, mainly with therapy and at night- con't regimen  2/22- taking pain meds regularly- will d/c Friday- will con't meds- but will need to get from PCP after d/c. 4. Mood: LCSW to follow for evaluation and support.              -antipsychotic agents: N/A 5. Neuropsych: This patient is  capable of making decisions on his own behalf. 6. Skin/Wound Care: Routine pressure relief measures.  7. Fluids/Electrolytes/Nutrition/FTT: encourage po  -I personally reviewed the patient's labs today.    2/9 mild hyponatremia--recheck Monday  2/20- Na 133- will con't to monitor  -remeron/megace to help with appetite  -on D3 diet due to poorly fitting dentures  -intake is fair/inconsistent--continue to encourage 8. PML/Disseminated MAC: Continue Zithromax and Ethambutol.  9. 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 Continue Biktarvy             --on Bactrim for PJP prophylaxis.  10. Pancytopenia: Neutropenia with WBC stable at 2.8K             -anemia slightly improved at 9.1       11. Insomnia: Encouraged use of Remeron and Seroquel also to help with sleep.  Increased Remeron to 172m          improved 2/17  2/21- improved sleep- con't regimen 12. Tachycardia: Monitor HR/BP TID- Monitor HR with activity, EKG with sinus tach     Vitals:   08/04/21  0758 08/04/21 0833  BP:  119/85  Pulse:  100  Resp:  15  Temp:  97.8 F (36.6 C)  SpO2: 96% 99%  BPs on low side so hesistant in starting BB, asymptomatic, may have deconditioning component, asymptomatic asymptomatic- so con't off BB  2/22- pulse better this AM- 75-99- con't off BB  2/23- HR 101-121 this AM- more elevated than normal- however asymptomatic- con't off b  Blocker due to BP 14. Abnormal LFTs: Continue to hold Lipitor. Will hold motrin also for now.  -  LFTs stable, sl improved 2/9.   15. COPD: Continue MDI. No wheezing  16. Dispo 2/22- will likely best be helped by Dr Letta Pate or Dr Ranell Patrick at d/c for Botox possible of R side. Since Dr Letta Pate has seen pt- will arrange f/u with him, if it's OK with him     LOS: 15 days A FACE TO FACE EVALUATION WAS PERFORMED  Fernando Stoiber 08/04/2021, 8:36 AM

## 2021-08-04 NOTE — Progress Notes (Signed)
Inpatient Rehabilitation Discharge Medication Review by a Pharmacist  A complete drug regimen review was completed for this patient to identify any potential clinically significant medication issues.  High Risk Drug Classes Is patient taking? Indication by Medication  Antipsychotic Yes Seroquel- sleep/MDD adjunctive therapy   Anticoagulant No   Antibiotic Yes Azithromycin, ethambutol- disseminated MAC   Bactrim- PJP prophylaxis  Biktarvy- HIV  Opioid Yes OxyIR- acute pain  Antiplatelet Yes Aspirin- CVA prophylaxis  Hypoglycemics/insulin No   Vasoactive Medication No   Chemotherapy No   Other Yes Megestrol- cachexia Remeron- sleep/MDD Protonix- GERD     Type of Medication Issue Identified Description of Issue Recommendation(s)  Drug Interaction(s) (clinically significant)     Duplicate Therapy     Allergy     No Medication Administration End Date     Incorrect Dose     Additional Drug Therapy Needed     Significant med changes from prior encounter (inform family/care partners about these prior to discharge).    Other       Clinically significant medication issues were identified that warrant physician communication and completion of prescribed/recommended actions by midnight of the next day:  No  Name of provider notified for urgent issues identified:   Provider Method of Notification:     Pharmacist comments:   Time spent performing this drug regimen review (minutes):  30   Sherrod Toothman BS, PharmD, BCPS Clinical Pharmacist 08/04/2021 11:07 AM

## 2021-08-04 NOTE — Progress Notes (Signed)
Occupational Therapy Discharge Summary  Patient Details  Name: Kurt Foley MRN: [id number] Date of Birth: 28-Dec-1975    Patient has met 6 of 11 long term goals due to improved balance, postural control, and ability to compensate for deficits.  Patient to discharge at overall Supervision level with mobility and Min Assist level with self care.  Patient's care partner is independent to provide the necessary physical assistance at discharge.   His Aunt has been assisting him since his CVA last year and did not need to come in for education. Pt education only.  Handout for HEP provided.   Reasons goals not met: Pt did not meet goals to be supervision only with dynamic standing balance, bathing and dressing and independent with eating.  He continues to need min A with balance, bathing and dressing and opening containers with eating.   He frequently declined self care training, stating he felt it was a waste of time because his aunt helps him.  During self care sessions when he was taught hemi techniques, pt would often get frustrated.  He truly wanted to spend all of his OT working on his [place].    Recommendation:  Patient will benefit from ongoing skilled OT services in home health setting to continue to advance functional skills in the area of BADL and iADL.  Equipment: Transfer tub bench  Reasons for discharge: treatment goals met  Patient/family agrees with progress made and goals achieved: Yes  OT Discharge Precautions/Restrictions  Precautions Precautions: Fall Precaution Comments: R side hemi Required Braces or Orthoses: Splint/Cast Splint/Cast: R resting hand splint Restrictions Weight Bearing Restrictions: No   Vital Signs Therapy Vitals Temp: 97.8 F (36.6 C) Pulse Rate: 100 Resp: 15 BP: 119/85 Patient Position (if appropriate): Lying Oxygen Therapy SpO2: 99 % O2 Device: Room Air Pain Pain Assessment Pain Scale: 0-10 Pain Score: 0-No pain Pain Type: Chronic  pain Pain Location: Back Pain Orientation: Lower;Medial Pain Descriptors / Indicators: Aching Pain Frequency: Constant Pain Onset: Progressive Patients Stated Pain Goal: 3 Pain Intervention(s): Medication (See eMAR) ADL ADL Eating: Set up Grooming: Setup Upper Body Bathing: Minimal assistance Where Assessed-Upper Body Bathing: Shower Lower Body Bathing: Minimal assistance Where Assessed-Lower Body Bathing: Shower Upper Body Dressing: Minimal assistance Lower Body Dressing: Minimal assistance Where Assessed-Lower Body Dressing: Edge of bed Toileting: Supervision/safety Where Assessed-Toileting: Glass blower/designer: Close supervision Toilet Transfer Method: Ambulating Tub/Shower Transfer: Distant supervision Tub/Shower Transfer Method: Optometrist: Facilities manager: Close supervision Social research officer, government Method: Heritage manager: Radio broadcast assistant, Grab bars Vision Baseline Vision/History: 0 No visual deficits Patient Visual Report: No change from baseline Vision Assessment?: No apparent visual deficits Perception  Perception: Within Functional Limits Praxis Praxis: Intact Cognition Overall Cognitive Status: History of cognitive impairments - at baseline Arousal/Alertness: Awake/alert Orientation Level: Oriented X4 Year: 2023 Month: February Day of Week: Correct Sustained Attention: Appears intact Selective Attention: Impaired Alternating Attention: Impaired Memory: Impaired Immediate Memory Recall: Sock;Blue;Bed Memory Recall Sock: Without Cue Memory Recall Blue: With Cue Memory Recall Bed: With Cue Awareness: Impaired Awareness Impairment: Anticipatory impairment Problem Solving: Impaired Problem Solving Impairment: Functional basic Organizing: Impaired Safety/Judgment: Impaired Sensation Sensation Light Touch: Impaired Detail Light Touch Impaired Details: Impaired RUE;Impaired  RLE Hot/Cold: Impaired by gross assessment Proprioception: Impaired by gross assessment Stereognosis: Impaired by gross assessment Coordination Gross Motor Movements are Fluid and Coordinated: No Fine Motor Movements are Fluid and Coordinated: No Coordination and Movement Description: Hx of CVA with R residual weakness  Finger Nose Finger Test: Unable on R. WFL on L. Heel Shin Test: slightly limited ROM and decreased coordination R LE Motor  Motor Motor: Hemiplegia;Abnormal tone Motor - Skilled Clinical Observations: Tone present in RUE/RLE Motor - Discharge Observations: continues to have hypertone in RUE/RLE Mobility  Bed Mobility Bed Mobility: Rolling Right;Rolling Left;Supine to Sit;Sit to Supine Rolling Right: Independent Rolling Left: Independent Supine to Sit: Independent Sit to Supine: Independent Transfers Sit to Stand: Supervision/Verbal cueing Stand to Sit: Supervision/Verbal cueing  Trunk/Postural Assessment  Cervical Assessment Cervical Assessment: Exceptions to [facility] Thoracic Assessment Thoracic Assessment: Exceptions to [facility] Lumbar Assessment Lumbar Assessment: Exceptions to [facility] Postural Control Postural Control: Deficits on evaluation Righting Reactions: Delayed  Balance Balance Balance Assessed: Yes Static Sitting Balance Static Sitting - Balance Support: No upper extremity supported;Feet supported Static Sitting - Level of Assistance: 7: Independent Dynamic Sitting Balance Dynamic Sitting - Balance Support: Feet supported;Left upper extremity supported Dynamic Sitting - Level of Assistance: 7: Independent Static Standing Balance Static Standing - Balance Support: During functional activity;Left upper extremity supported Static Standing - Level of Assistance: 6: Modified independent (Device/Increase time) Dynamic Standing Balance Dynamic Standing - Balance Support: Left upper extremity supported;During functional activity Dynamic Standing - Level of  Assistance: 5: Stand by assistance Extremity/Trunk Assessment RUE Assessment Passive Range of Motion (PROM) Comments: WFL Active Range of Motion (AROM) Comments: only trace scapular retraction and sh flex/abd RUE Body System: Neuro Brunstrum levels for arm and hand: Arm;Hand Brunstrum level for arm: Stage II Synergy is developing Brunstrum level for hand: Stage I Flaccidity RUE Tone RUE Tone: Hypertonic;Moderate Hypertonic Details: subluxation of 1 1/2 finger width, tone pulls humeral head into anterior position,  mod hypertone in elbow, wrist and finger flexors LUE Assessment LUE Assessment: Within Functional Limits   Rohin Krejci 08/04/2021, 11:49 AM

## 2021-08-04 NOTE — Progress Notes (Signed)
Occupational Therapy Session Note  Patient Details  Name: Kurt Foley MRN: [id number] Date of Birth: Mar 03, 1976  Today's Date: 08/04/2021 OT Individual Time: 0830-0930 OT Individual Time Calculation (min): 60 min    Short Term Goals: Week 1:  OT Short Term Goal 1 (Week 1): STG=LTG 2/2 ELOS Week 2:  OT Short Term Goal 1 (Week 2): continuation of LTGs  Skilled Therapeutic Interventions/Progress Updates:    Pt received in bed. Scheduled OT first this morning to enable pt to complete shower and dressing this am.  Pt was very adamant that he did not smell so he did not need to shower and he wanted to keep his clothing on.  He said several times that his aunt will help him bathe once he gets home.  Pt did agree to work on ambulating with hemiwalker to the toilet.  He did need A to don shoes and AFO.   Pt able to come to EOB, stand and ambulate to toilet with close S. S with sit to stands and clothing management/ cleansing.   Pt then ambulated back to w/c in room. Pt declined oral care.   Pt taken to gym to work on [place] and A/arom using UE Ranger.   He then ambulated from gym to ADL apt kitchen to sit at a table to do his HEP.  Pt worked on A/arom using his LUE to help his RUE with towel slides in sitting and standing weight bearing.  Pt transported back to room and returned to sit EOB.  Bed alarm set and all needs met.     Therapy Documentation Precautions:  Precautions Precautions: Fall Precaution Comments: R side hemi Required Braces or Orthoses: Splint/Cast Splint/Cast: R resting hand splint Restrictions Weight Bearing Restrictions: No   Pain: Pain Assessment Pain Scale: 0-10 Pain Score: 0-No pain ADL: ADL Eating: Set up Grooming: Setup Upper Body Bathing: Minimal assistance Where Assessed-Upper Body Bathing: Shower Lower Body Bathing: Minimal assistance Where Assessed-Lower Body Bathing: Shower Upper Body Dressing: Minimal assistance Lower Body Dressing:  Minimal assistance Where Assessed-Lower Body Dressing: Edge of bed Toileting: Supervision/safety Where Assessed-Toileting: Glass blower/designer: Close supervision Armed forces technical officer Method: Theatre manager Transfer: Distant supervision Tub/Shower Transfer Method: Optometrist: Facilities manager: Close supervision Social research officer, government Method: Heritage manager: Radio broadcast assistant, Grab bars   Therapy/Group: Individual Therapy  [name] 08/04/2021, 12:43 PM

## 2021-08-05 ENCOUNTER — Other Ambulatory Visit (HOSPITAL_COMMUNITY): Payer: Self-pay

## 2021-08-05 LAB — CBC WITH DIFFERENTIAL/PLATELET
Abs Immature Granulocytes: 0.23 10*3/uL — ABNORMAL HIGH (ref 0.00–0.07)
Basophils Absolute: 0 10*3/uL (ref 0.0–0.1)
Basophils Relative: 1 %
Eosinophils Absolute: 0.1 10*3/uL (ref 0.0–0.5)
Eosinophils Relative: 1 %
HCT: 29.6 % — ABNORMAL LOW (ref 39.0–52.0)
Hemoglobin: 9.4 g/dL — ABNORMAL LOW (ref 13.0–17.0)
Immature Granulocytes: 6 %
Lymphocytes Relative: 15 %
Lymphs Abs: 0.6 10*3/uL — ABNORMAL LOW (ref 0.7–4.0)
MCH: 28.7 pg (ref 26.0–34.0)
MCHC: 31.8 g/dL (ref 30.0–36.0)
MCV: 90.5 fL (ref 80.0–100.0)
Monocytes Absolute: 0.4 10*3/uL (ref 0.1–1.0)
Monocytes Relative: 12 %
Neutro Abs: 2.5 10*3/uL (ref 1.7–7.7)
Neutrophils Relative %: 65 %
Platelets: 373 10*3/uL (ref 150–400)
RBC: 3.27 MIL/uL — ABNORMAL LOW (ref 4.22–5.81)
RDW: 20.1 % — ABNORMAL HIGH (ref 11.5–15.5)
WBC: 3.8 10*3/uL — ABNORMAL LOW (ref 4.0–10.5)
nRBC: 0 % (ref 0.0–0.2)

## 2021-08-05 LAB — BASIC METABOLIC PANEL
Anion gap: 10 (ref 5–15)
BUN: 21 mg/dL — ABNORMAL HIGH (ref 6–20)
CO2: 21 mmol/L — ABNORMAL LOW (ref 22–32)
Calcium: 9.6 mg/dL (ref 8.9–10.3)
Chloride: 105 mmol/L (ref 98–111)
Creatinine, Ser: 0.95 mg/dL (ref 0.61–1.24)
GFR, Estimated: >60 mL/min (ref >60)
Glucose, Bld: 123 mg/dL — ABNORMAL HIGH (ref 70–99)
Potassium: 4 mmol/L (ref 3.5–5.1)
Sodium: 136 mmol/L (ref 135–145)

## 2021-08-05 MED ORDER — SODIUM CHLORIDE 0.9 % IV SOLN: INTRAVENOUS | Status: DC

## 2021-08-05 MED ORDER — MIRTAZAPINE 15 MG PO TABS
15.0000 mg | ORAL_TABLET | Freq: Every day | ORAL | 5 refills | Status: AC
Start: 2021-08-05 — End: ?
  Filled 2021-08-05: qty 30, 30d supply, fill #0

## 2021-08-05 MED ORDER — DICLOFENAC SODIUM 1 % EX GEL
2.0000 g | Freq: Four times a day (QID) | CUTANEOUS | 0 refills | Status: DC
  Filled 2021-08-05: qty 200, 25d supply, fill #0

## 2021-08-05 MED ORDER — UMECLIDINIUM BROMIDE 62.5 MCG/ACT IN AEPB
1.0000 | INHALATION_SPRAY | Freq: Every day | RESPIRATORY_TRACT | 5 refills | Status: DC
  Filled 2021-08-05: qty 30, 30d supply, fill #0

## 2021-08-05 MED ORDER — ETHAMBUTOL HCL 400 MG PO TABS
800.0000 mg | ORAL_TABLET | Freq: Every day | ORAL | 0 refills | Status: DC
  Filled 2021-08-05: qty 60, 30d supply, fill #0

## 2021-08-05 MED ORDER — MEGESTROL ACETATE 400 MG/10ML PO SUSP
400.0000 mg | Freq: Every day | ORAL | 0 refills | Status: DC
  Filled 2021-08-05: qty 180, 18d supply, fill #0

## 2021-08-05 MED ORDER — ACETAMINOPHEN 325 MG PO TABS
325.0000 mg | ORAL_TABLET | ORAL | 0 refills | Status: AC | PRN
  Filled 2021-08-05: qty 50, 5d supply, fill #0

## 2021-08-05 MED ORDER — ASPIRIN 81 MG PO CHEW
81.0000 mg | CHEWABLE_TABLET | Freq: Every day | ORAL | 0 refills | Status: DC
  Filled 2021-08-05: qty 30, 30d supply, fill #0

## 2021-08-05 MED ORDER — SULFAMETHOXAZOLE-TRIMETHOPRIM 800-160 MG PO TABS
1.0000 | ORAL_TABLET | Freq: Every day | ORAL | 11 refills | Status: DC
  Filled 2021-08-05: qty 30, 30d supply, fill #0

## 2021-08-05 MED ORDER — QUETIAPINE FUMARATE 200 MG PO TABS
200.0000 mg | ORAL_TABLET | Freq: Every day | ORAL | 5 refills | Status: AC
  Filled 2021-08-05: qty 30, 30d supply, fill #0

## 2021-08-05 MED ORDER — CARVEDILOL 3.125 MG PO TABS
3.1250 mg | ORAL_TABLET | Freq: Two times a day (BID) | ORAL | 0 refills | Status: DC
  Filled 2021-08-05: qty 60, 30d supply, fill #0

## 2021-08-05 MED ORDER — CARVEDILOL 3.125 MG PO TABS
3.1250 mg | ORAL_TABLET | Freq: Every day | ORAL | 11 refills | Status: DC
  Filled 2021-08-05: qty 30, 30d supply, fill #0

## 2021-08-05 MED ORDER — SENNOSIDES-DOCUSATE SODIUM 8.6-50 MG PO TABS
1.0000 | ORAL_TABLET | Freq: Two times a day (BID) | ORAL | 0 refills | Status: AC
  Filled 2021-08-05: qty 60, 30d supply, fill #0

## 2021-08-05 MED ORDER — BICTEGRAVIR-EMTRICITAB-TENOFOV 50-200-25 MG PO TABS
1.0000 | ORAL_TABLET | Freq: Every day | ORAL | 11 refills | Status: DC
  Filled 2021-08-05: qty 30, 30d supply, fill #0

## 2021-08-05 MED ORDER — OXYCODONE HCL 10 MG PO TABS
5.0000 mg | ORAL_TABLET | Freq: Four times a day (QID) | ORAL | 0 refills | Status: DC | PRN
  Filled 2021-08-05: qty 20, 5d supply, fill #0

## 2021-08-05 MED ORDER — CARVEDILOL 3.125 MG PO TABS
3.1250 mg | ORAL_TABLET | Freq: Two times a day (BID) | ORAL | Status: DC
  Administered 2021-08-05 – 2021-08-06 (×2): 3.125 mg via ORAL
  Filled 2021-08-05 (×2): qty 1

## 2021-08-05 MED ORDER — MEGESTROL ACETATE 625 MG/5ML PO SUSP
625.0000 mg | Freq: Every day | ORAL | 2 refills | Status: DC
  Filled 2021-08-05: qty 300, 60d supply, fill #0

## 2021-08-05 MED ORDER — PANTOPRAZOLE SODIUM 40 MG PO TBEC
40.0000 mg | DELAYED_RELEASE_TABLET | Freq: Every day | ORAL | 0 refills | Status: DC
  Filled 2021-08-05: qty 30, 30d supply, fill #0

## 2021-08-05 MED ORDER — FLUTICASONE FUROATE-VILANTEROL 100-25 MCG/ACT IN AEPB
1.0000 | INHALATION_SPRAY | Freq: Every day | RESPIRATORY_TRACT | 5 refills | Status: DC
  Filled 2021-08-05: qty 60, 30d supply, fill #0

## 2021-08-05 MED ORDER — CARVEDILOL 3.125 MG PO TABS
3.1250 mg | ORAL_TABLET | Freq: Every day | ORAL | Status: DC
  Administered 2021-08-05: 3.125 mg via ORAL
  Filled 2021-08-05: qty 1

## 2021-08-05 MED ORDER — AZITHROMYCIN 500 MG PO TABS
500.0000 mg | ORAL_TABLET | Freq: Every day | ORAL | 0 refills | Status: DC
Start: 2021-08-05 — End: 2021-10-07
  Filled 2021-08-05: qty 30, 30d supply, fill #0

## 2021-08-05 MED ORDER — CERTAVITE/ANTIOXIDANTS PO TABS
1.0000 | ORAL_TABLET | Freq: Every day | ORAL | 0 refills | Status: AC
  Filled 2021-08-05: qty 30, 30d supply, fill #0

## 2021-08-05 MED ORDER — BICTEGRAVIR-EMTRICITAB-TENOFOV 50-200-25 MG PO TABS: 1.0000 | ORAL_TABLET | Freq: Every day | ORAL | 11 refills | Status: DC

## 2021-08-05 NOTE — Progress Notes (Addendum)
Assessed patient at the start of shift. Mews 1 and green for 110 HR. Pt denies any CP, SHOB, or heart palpations. Pt is sitting on side of the bed eating at the time.   0000 vitals rechecked. HR 128 Pt asleep when I entered room. Pt a/o times 3-4 and can express his needs with some mild expressive aphasia noted.   Charge RN [place] and PA [employer] notified. Orders to give stat EKG. EKG showed Sinus Tach. The results were transmitted to PA. PA wants CBC and BMP ordered. Yellow Mews at this time and Mews protocol followed. PT denies any stress.  0630: RN reassessed Pt's vitals HR remains 128 bpm, BP  122/96. DR Lovorn notified face to face.

## 2021-08-05 NOTE — Discharge Summary (Signed)
Physician Discharge Summary  Patient ID: ZYHEIR DAFT MRN: [id number] DOB/AGE: 08/06/1975 46 y.o.  Admit date: 07/20/2021 Discharge date: 08/06/2021  Discharge Diagnoses:  Principal Problem:   Encephalitis Active Problems:   AIDS (acquired immune deficiency syndrome) (HCC)   FTT (failure to thrive) in adult   Malnutrition of moderate degree   Hyponatremia   Debility   Sinus tachycardia   Insomnia   Abnormal LFTs   Neutropenia (HCC)   Discharged Condition: stable  Significant Diagnostic Studies: N/A   Labs:  Basic Metabolic Panel: BMP Latest Ref Rng & Units 08/06/2021 08/05/2021 08/01/2021  Glucose 70 - 99 mg/dL 109(H) 123(H) 100(H)  BUN 6 - 20 mg/dL 20 21(H) 17  Creatinine 0.61 - 1.24 mg/dL 0.81 0.95 0.88  BUN/Creat Ratio 6 - 22 (calc) - - -  Sodium 135 - 145 mmol/L 132(L) 136 133(L)  Potassium 3.5 - 5.1 mmol/L 4.2 4.0 4.4  Chloride 98 - 111 mmol/L 105 105 102  CO2 22 - 32 mmol/L 17(L) 21(L) 18(L)  Calcium 8.9 - 10.3 mg/dL 9.2 9.6 9.4     CBC: CBC Latest Ref Rng & Units 08/05/2021 08/01/2021 07/25/2021  WBC 4.0 - 10.5 K/uL 3.8(L) 2.9(L) 2.8(L)  Hemoglobin 13.0 - 17.0 g/dL 9.4(L) 8.8(L) 9.1(L)  Hematocrit 39.0 - 52.0 % 29.6(L) 28.9(L) 28.6(L)  Platelets 150 - 400 K/uL 373 397 424(H)    Liver Function tests:  Hepatic Function Latest Ref Rng & Units 08/01/2021 07/21/2021 07/17/2021  Total Protein 6.5 - 8.1 g/dL 7.2 6.9 7.0  Albumin 3.5 - 5.0 g/dL 3.1(L) 2.6(L) 2.7(L)  AST 15 - 41 U/L 56(H) 45(H) 59(H)  ALT 0 - 44 U/L 71(H) 57(H) 67(H)  Alk Phosphatase 38 - 126 U/L 152(H) 171(H) 167(H)  Total Bilirubin 0.3 - 1.2 mg/dL 0.3 0.2(L) 0.2(L)  Bilirubin, Direct 0.1 - 0.5 mg/dL - - -     CBG: No results for input(s): GLUCAP in the last 168 hours.  Brief HPI:   Kurt Foley is a 46 y.o. 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  He was readmitted to [facility] 07/13/2021 with FTT as well as reports of visual disturbance.  Eye exam by Dr. Eulas Post was negative for retinitis, vitritis, uveitis, acute infection ethambutol toxicity.  ID consulted for work-up and recommended continuing MAC treatment as well as LP for further work-up.  LP done was negative for organism and no fungal growth.  Low grade fevers were worked up with blood cultures and urine cultures which were negative.  ReSound done due to abnormal LFTs and no acute findings noticed.  Megace was added to help with intake and electrolyte abnormalities resolved with supplementation.  He was showing improvement in activity tolerance as well as intake but continued to be limited by weakness with unsteady gait and ataxia.  CIR was recommended due to functional decline.   Hospital Course: AMUN STEMM was admitted to rehab 07/20/2021 for inpatient therapies to consist of PT, ST and OT at least three hours five days a week. Past admission physiatrist, therapy team and rehab RN have worked together to provide customized collaborative inpatient rehab.  His p.o. intake has been good and nutritional supplements were offered on 4 times daily basis.  Serial check of electrolytes shows mild hyponatremia to be stable and renal status within normal limits.  Follow-up check of  LFTs showed continued elevation without any GI symptoms.  Repeat CBC/H&H and platelets to be within normal limits and white count is slowly improving.  He continues on Biktarvy with Bactrim for PJP prophylaxis.He reported issues with chronic back pain and oxycodone has been used on as needed basis in addition to Voltaren gel for right shoulder pain.  If he did have local reaction to lidocaine patch therefore this was discontinued.  He continues on Zithromax/Ethambutol for treatment of disseminated MAC/PML.  Blood pressures were monitored on TID basis and have been stable.  Hospital course was significant for  issues with tachycardia with heart rates up to 120s 130s requiring delaying discharge.  Labs done revealed prerenal azotemia and he was hydrated with IV fluids for 24 hours.  Low-dose Coreg was also added to help manage acute on chronic tachycardia and he is to follow-up with PCP for further adjustment of discharge.  Team has provided ego support throughout his stay and his mood has been stable.  He has had good gains during his rehab stay and currently requires supervision for safety.  No follow-up therapy is available due to lack of insurance/financial resources and he was given information to contact [facility] clinic for progressive physical therapy after discharge    Rehab course: During patient's stay in rehab weekly team conferences were held to monitor patient's progress, set goals and discuss barriers to discharge. At admission, patient required min assist with mobility and with ADL tasks.  He  has had improvement in activity tolerance, balance, postural control as well as ability to compensate for deficits. He is able to complete ADL tasks with min assist as he frequently declined self care training and reported that his aunt would assist him after discharge. OT has worked on [employer] therapy per patient request.  He requires supervision with cues for transfers, to ambulate 350' with hemi-walker and to climb 12 stairs with one rail.     Disposition: Home  Diet: Soft foods.   Special Instructions: Need to increase fluid intake. Repeat BMET in 1-2 weeks.    Discharge Instructions     Ambulatory referral to Infectious Disease   Complete by: As directed    HIV/hospital follow up --being d/c today   Ambulatory referral to Physical Medicine Rehab   Complete by: As directed    Appointment with [facility] or [facility]      Allergies as of 08/06/2021   No Known Allergies      Medication List     STOP taking these medications    feeding supplement Liqd    HYDROcodone-acetaminophen 5-325 MG tablet Commonly known as: NORCO/VICODIN   ibuprofen 400 MG tablet Commonly known as: ADVIL   megestrol 625 MG/5ML suspension Commonly known as: Megace ES   Quintabs Tabs Replaced by: CertaVite/Antioxidants Tabs       TAKE these medications    acetaminophen 325 MG tablet Commonly known as: TYLENOL Take 1-2 tablets (325-650 mg total) by mouth every 4 (four) hours as needed for mild pain.   Aspirin Low Dose 81 MG chewable tablet Generic drug: aspirin Chew 1 tablet (81 mg total) by mouth daily.   azithromycin 500 MG tablet Commonly known as: ZITHROMAX Take 1 tablet (500 mg total) by mouth at bedtime. What changed: when to take this   bictegravir-emtricitabine-tenofovir AF 50-200-25 MG Tabs tablet Commonly known as: BIKTARVY Take 1 tablet by mouth daily. Notes to patient: YOU NEED TO PICK THIS UP AT [facility] ON [address] (IT'S COVERED THERE BY  A GRANT)   Breo Ellipta 100-25 MCG/ACT Aepb Generic drug: fluticasone furoate-vilanterol Inhale 1 puff into the lungs daily.   carvedilol 6.25 MG tablet Commonly known as: COREG Take 1 tablet (6.25 mg total) by mouth 2 (two) times daily with a meal.   CertaVite/Antioxidants Tabs Take 1 tablet by mouth daily. Replaces: Quintabs Tabs   diclofenac Sodium 1 % Gel Commonly known as: VOLTAREN Apply 2 g topically 4 (four) times daily.   ethambutol 400 MG tablet Commonly known as: MYAMBUTOL Take 2 tablets (800 mg total) by mouth daily. What changed: how much to take   Incruse Ellipta 62.5 MCG/ACT Aepb Generic drug: umeclidinium bromide Inhale 1 puff into the lungs daily. What changed: medication strength   megestrol 40 MG/ML suspension Commonly known as: MEGACE Take 10 mLs (400 mg total) by mouth daily.   mirtazapine 15 MG tablet Commonly known as: Remeron Take 1 tablet (15 mg total) by mouth at bedtime. What changed: how much to take   Oxycodone HCl 10 MG Tabs Take 0.5-1 tablets  (5-10 mg total) by mouth every 6 (six) hours as needed for severe pain.   pantoprazole 40 MG tablet Commonly known as: PROTONIX Take 1 tablet (40 mg total) by mouth daily.   polyethylene glycol 17 g packet Commonly known as: MIRALAX / GLYCOLAX Take 17 g by mouth daily as needed for mild constipation or moderate constipation.   QUEtiapine 200 MG tablet Commonly known as: SEROquel Take 1 tablet (200 mg total) by mouth at bedtime.   Senexon-S 8.6-50 MG tablet Generic drug: senna-docusate Take 1 tablet by mouth 2 (two) times daily.   sulfamethoxazole-trimethoprim 800-160 MG tablet Commonly known as: BACTRIM DS Take 1 tablet by mouth daily.        Follow-up Information     Comer, Okey Regal, MD .   Specialty: Infectious Diseases Contact information: [address] [place] [address] [phone]         Courtney Heys, MD Follow up.   Specialty: Physical Medicine and Rehabilitation Contact information: [address] [place] [address] [phone]         Izora Ribas, MD Follow up.   Specialty: Physical Medicine and Rehabilitation Why: office will call you with follow up appointment Contact information: [address] [address] [phone]         [facility] Follow up.   Why: They are going to call you with appointment for follow up. YOU NEED TO KEEP THIS APPOINTMENT. Contact information: [place] [address] [phone]                Signed: Bary Leriche 08/08/2021, 5:53 PM

## 2021-08-05 NOTE — Progress Notes (Signed)
Discharge held due to tachycardia likely due to AKI. Dicussed with Dr. Berline Chough and IVF added for hydration. Patient educated increasing fluid intake, po intake as well as current meds and importance of  compliance with meds. Aunt updated on above--she plans on administering meds for compliance. Patient and aunt informed that referral sent for PCP at [facility] who will contact them for hospital follow up in 2 weeks.

## 2021-08-05 NOTE — Progress Notes (Addendum)
Inpatient Rehabilitation Care Coordinator Discharge Note   Patient Details  Name: Kurt Foley MRN: [id number] Date of Birth: November 16, 1975   Discharge location: HOME WITH AUNT-TRACY WILL ALMOST HAVE 24/7 SUPERVISION  Length of Stay: 17 days  Discharge activity level: SUPERVISION LEVEL  Home/community participation: ACTIVE  Patient response UG:QBVQXI Literacy - How often do you need to have someone help you when you read instructions, pamphlets, or other written material from your doctor or pharmacy?: Always  Patient response HW:TUUEKC Isolation - How often do you feel lonely or isolated from those around you?: Never  Services provided included: MD, RD, PT, OT, SLP, RN, CM, Pharmacy, TR, SW  Financial Services:  Field seismologist Utilized: Other (Comment) (PENDING MEDICAID)    Choices offered to/list presented to: PT AND AUNT  Follow-up services arranged:  DME, Patient/Family has no preference for HH/DME agencies HOME EXERCISE PROGRAM GIVEN-[employer] [employer] [facility] INFORMATION GIVEN TO AUNT. 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 AUNT AND FAMILY VERY INVOLVED AND SUPPORTIVE. READY FOR DC TODAY  Patient/Family verbalized understanding of follow-up arrangements:  Yes  Individual responsible for coordination of the follow-up plan: TRACY-AUNT [phone]  Confirmed correct DME delivered: Lucy Chris 08/05/2021    Tallie Hevia, Lemar Livings

## 2021-08-05 NOTE — Progress Notes (Addendum)
Updated vital signs charted to include  elevated HR of 131, Coreg given, Delle Reining made aware, Charge aware, discharge meds sent to [facility], awaiting family arrival for discharge. Will re-evaluate HR in Q2 hrs as per the MEWS protocol.   1145: HR improved to 111, will reassess at 1545 as per protocol.

## 2021-08-05 NOTE — Progress Notes (Signed)
PROGRESS NOTE   Subjective/Complaints:  Pt reports no issues- asking when will be discharged today.  Although heart rate was up overnight,  to 120s- BP was better at 110/82-  Says doesn't notice the heart rate elevation and doesn't feel bad.   WBC up very slightly to 3.8-from 2.9- diff is pending.  Also shows he's dry with BUN of 21- which is new.   ROS:   Pt denies SOB, abd pain, CP, N/V/C/D, and vision changes     Objective:   No results found. Recent Labs    08/05/21 0634  WBC 3.8*  HGB 9.4*  HCT 29.6*  PLT 373    Recent Labs    08/05/21 0634  NA 136  K 4.0  CL 105  CO2 21*  GLUCOSE 123*  BUN 21*  CREATININE 0.95  CALCIUM 9.6      Intake/Output Summary (Last 24 hours) at 08/05/2021 0758 Last data filed at 08/04/2021 2200 Gross per 24 hour  Intake 1070 ml  Output 300 ml  Net 770 ml        Physical Exam: Vital Signs Blood pressure (!) 122/96, pulse (!) 127, temperature 98.1 F (36.7 C), resp. rate 16, height _0  (1.651 m), weight 56.7 kg, SpO2 100 %.          General: awake, alert, appropriate, sitting up at EOB; phlebotomy in room;  NAD HENT: conjugate gaze; oropharynx moist CV: tachycardic, but sounds like regular rhythm;  no JVD Pulmonary: CTA B/L; no W/R/R- good air movement GI: soft, NT, ND, (+)BS Psychiatric: appropriate Neurological: Ox3- dysarthric- hard to understand sometimes; MAS of 2 in RUE Ext: no clubbing, cyanosis, or edema Psych: pleasant and cooperative  Neuro:  Alert and oriented x 3. Normal insight and awareness. Intact Memory. Normal language. Speech dysarthric. Left facial weakness. RUE 0/5. RLE 3- prox to 0/5 distally. Fair PROM. Decreased sensation to LT on right Musculoskeletal: right shoulder remains tight, some pain with PROM. Low back TTP.   Assessment/Plan: 1. Functional deficits which require 3+ hours per day of interdisciplinary therapy in a  comprehensive inpatient rehab setting. Physiatrist is providing close team supervision and 24 hour management of active medical problems listed below. Physiatrist and rehab team continue to assess barriers to discharge/monitor patient progress toward functional and medical goals  Care Tool:  Bathing  Bathing activity did not occur: Refused Body parts bathed by patient: Chest, Abdomen, Front perineal area, Buttocks, Right upper leg, Left upper leg, Right lower leg, Left lower leg, Face, Right arm   Body parts bathed by helper: Left arm     Bathing assist Assist Level: Minimal Assistance - Patient > 75%     Upper Body Dressing/Undressing Upper body dressing Upper body dressing/undressing activity did not occur (including orthotics): Refused What is the patient wearing?: Pull over shirt    Upper body assist Assist Level: Minimal Assistance - Patient > 75%    Lower Body Dressing/Undressing Lower body dressing      What is the patient wearing?: Pants     Lower body assist Assist for lower body dressing: Minimal Assistance - Patient > 75%     Toileting Toileting  Toileting assist Assist for toileting: Supervision/Verbal cueing     Transfers Chair/bed transfer  Transfers assist     Chair/bed transfer assist level: Independent with assistive device Chair/bed transfer assistive device: Programmer, multimedia   Ambulation assist      Assist level: Supervision/Verbal cueing Assistive device: Walker-hemi Max distance: 350   Walk 10 feet activity   Assist     Assist level: Independent with assistive device Assistive device: Walker-hemi   Walk 50 feet activity   Assist    Assist level: Independent with assistive device Assistive device: Walker-hemi    Walk 150 feet activity   Assist    Assist level: Supervision/Verbal cueing Assistive device: Walker-hemi    Walk 10 feet on uneven surface  activity   Assist Walk 10 feet on uneven  surfaces activity did not occur: Safety/medical concerns   Assist level: Contact Guard/Touching assist Assistive device: Walker-hemi   Wheelchair     Assist Is the patient using a wheelchair?: No Type of Wheelchair: Manual    Wheelchair assist level: Set up assist Max wheelchair distance: 150    Wheelchair 50 feet with 2 turns activity    Assist        Assist Level: Supervision/Verbal cueing   Wheelchair 150 feet activity     Assist      Assist Level: Supervision/Verbal cueing   Blood pressure (!) 122/96, pulse (!) 127, temperature 98.1 F (36.7 C), resp. rate 16, height _0  (1.651 m), weight 56.7 kg, SpO2 100 %.  Medical Problem List and Plan: 1. 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  2.  Antithrombotics: -DVT/anticoagulation:  Pharmaceutical: Lovenox             -antiplatelet therapy: ASA 3. Chronic LBP/Pain Management:   --Oxycodone 10 mg has been effective in the past-   -receiving 5-20m scheduled at night as well as prn --Lidocaine patches at nights (discomfort due to bed).  -voltaren gel for right shoulder pain 2/21- denies pain, but using meds, mainly with therapy and at night- con't regimen  2/22- taking pain meds regularly- will d/c Friday- will con't meds- but will need to get from PCP after d/c. 4. Mood: LCSW to follow for evaluation and support.              -antipsychotic agents: N/A 5. Neuropsych: This patient is  capable of making decisions on his own behalf. 6. Skin/Wound Care: Routine pressure relief measures.  7. Fluids/Electrolytes/Nutrition/FTT: encourage po  -I personally reviewed the patient's labs today.    2/9 mild hyponatremia--recheck Monday  2/20- Na 133-  will con't to monitor  2/24- Na 136- doing better  -remeron/megace to help with appetite  -on D3 diet due to poorly fitting dentures  -intake is fair/inconsistent--continue to encourage 8. PML/Disseminated MAC: Continue Zithromax and Ethambutol.  9. 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 Continue Biktarvy             --on Bactrim for PJP prophylaxis.  10. Pancytopenia: Neutropenia with WBC stable at 2.8K             -  anemia slightly improved at 9.1   2/24- WBC 3.9 and Hb up to 9.4- doing better 11. Insomnia: Encouraged use of Remeron and Seroquel also to help with sleep.  Increased Remeron to 51m.          improved 2/17  2/21- improved sleep- con't regimen 12. Tachycardia: Monitor HR/BP TID- Monitor HR with activity, EKG with sinus tach     Vitals:   08/05/21 0314 08/05/21 0643  BP: 110/82 (!) 122/96  Pulse: (!) 118 (!) 127  Resp: 16   Temp: 98.9 F (37.2 C) 98.1 F (36.7 C)  SpO2: 99% 100%  BPs on low side so hesistant in starting BB, asymptomatic, may have deconditioning component, asymptomatic asymptomatic- so con't off BB  2/22- pulse better this AM- 75-99- con't off BB  2/23- HR 101-121 this AM- more elevated than normal- however asymptomatic- con't off b Blocker due to BP  2/24- asymptomatic, however is dry which can make tachycardia worse- will d/w team if will add b blocker.  14. Abnormal LFTs: Continue to hold Lipitor. Will hold motrin also for now.  -  LFTs stable, sl improved 2/9.   15. COPD: Continue MDI. No wheezing  16. Dispo 2/22- will likely best be helped by Dr KLetta Pateor Dr RRanell Patrickat d/c for Botox possible of R side. Since Dr KLetta Patehas seen pt- will arrange f/u with him, if it's OK with him   I spent a total of  34  minutes on total care today- >50% coordination of care- due to d/w nursing about tachycardia and PA      LOS: 16 days A FACE TO FACE EVALUATION WAS PERFORMED  Sarah-Jane Nazario 08/05/2021, 7:58 AM

## 2021-08-05 NOTE — Progress Notes (Signed)
Rn reviewed current medications patient will be discharged home with as prescribed in the discharge/AVS summary. Pt requesting RN to please review meds and other specific instructions w/ his aunt tomorrow.

## 2021-08-06 DIAGNOSIS — R000 Tachycardia, unspecified: Secondary | ICD-10-CM

## 2021-08-06 DIAGNOSIS — E871 Hypo-osmolality and hyponatremia: Secondary | ICD-10-CM

## 2021-08-06 LAB — BASIC METABOLIC PANEL
Anion gap: 10 (ref 5–15)
BUN: 20 mg/dL (ref 6–20)
CO2: 17 mmol/L — ABNORMAL LOW (ref 22–32)
Calcium: 9.2 mg/dL (ref 8.9–10.3)
Chloride: 105 mmol/L (ref 98–111)
Creatinine, Ser: 0.81 mg/dL (ref 0.61–1.24)
GFR, Estimated: >60 mL/min (ref >60)
Glucose, Bld: 109 mg/dL — ABNORMAL HIGH (ref 70–99)
Potassium: 4.2 mmol/L (ref 3.5–5.1)
Sodium: 132 mmol/L — ABNORMAL LOW (ref 135–145)

## 2021-08-06 MED ORDER — CARVEDILOL 6.25 MG PO TABS
6.2500 mg | ORAL_TABLET | Freq: Two times a day (BID) | ORAL | 0 refills | Status: DC
  Filled 2021-08-06: qty 60, 30d supply, fill #0

## 2021-08-06 MED ORDER — CARVEDILOL 6.25 MG PO TABS: 6.2500 mg | ORAL_TABLET | Freq: Two times a day (BID) | ORAL | Status: DC

## 2021-08-06 NOTE — Progress Notes (Addendum)
PROGRESS NOTE   Subjective/Complaints: Patient seen lying in bed this morning.  She states she has used the restroom.  Discussed status and plans for discharge with nursing as well.  Communicated with provider-patient discharge held yesterday due to AKI.  He was started on IVF.  ROS:   Pt denies SOB, abd pain, CP, N/V/C/D, and vision changes     Objective:   No results found. Recent Labs    08/05/21 0634  WBC 3.8*  HGB 9.4*  HCT 29.6*  PLT 373     Recent Labs    08/05/21 0634 08/06/21 0652  NA 136 132*  K 4.0 4.2  CL 105 105  CO2 21* 17*  GLUCOSE 123* 109*  BUN 21* 20  CREATININE 0.95 0.81  CALCIUM 9.6 9.2       Intake/Output Summary (Last 24 hours) at 08/06/2021 0920 Last data filed at 08/05/2021 2110 Gross per 24 hour  Intake 1945.05 ml  Output 300 ml  Net 1645.05 ml         Physical Exam: Vital Signs Blood pressure 113/81, pulse (!) 120, temperature 98.9 F (37.2 C), resp. rate 16, height _0  (1.651 m), weight 56.7 kg, SpO2 97 %. Constitutional: No distress . Vital signs reviewed. HENT: Normocephalic.  Atraumatic. Eyes: EOMI. No discharge. Cardiovascular: No JVD.  RRR. Respiratory: Normal effort.  No stridor.  Bilateral clear to auscultation. GI: Non-distended.  BS +. Skin: Warm and dry.  Intact. Psych: Normal mood.  Normal behavior. Musc: No edema in extremities.  No tenderness in extremities. Neurological: Dysarthria  Assessment/Plan: 1. Functional deficits which require 3+ hours per day of interdisciplinary therapy in a comprehensive inpatient rehab setting. Physiatrist is providing close team supervision and 24 hour management of active medical problems listed below. Physiatrist and rehab team continue to assess barriers to discharge/monitor patient progress toward functional and medical goals  Care Tool:  Bathing  Bathing activity did not occur: Refused Body parts bathed by  patient: Chest, Abdomen, Front perineal area, Buttocks, Right upper leg, Left upper leg, Right lower leg, Left lower leg, Face, Right arm   Body parts bathed by helper: Left arm     Bathing assist Assist Level: Minimal Assistance - Patient > 75%     Upper Body Dressing/Undressing Upper body dressing Upper body dressing/undressing activity did not occur (including orthotics): Refused What is the patient wearing?: Pull over shirt    Upper body assist Assist Level: Minimal Assistance - Patient > 75%    Lower Body Dressing/Undressing Lower body dressing      What is the patient wearing?: Pants     Lower body assist Assist for lower body dressing: Minimal Assistance - Patient > 75%     Toileting Toileting    Toileting assist Assist for toileting: Supervision/Verbal cueing     Transfers Chair/bed transfer  Transfers assist     Chair/bed transfer assist level: Independent with assistive device Chair/bed transfer assistive device: Programmer, multimedia   Ambulation assist      Assist level: Supervision/Verbal cueing Assistive device: Walker-hemi Max distance: 350   Walk 10 feet activity   Assist     Assist level: Independent  with assistive device Assistive device: Walker-hemi   Walk 50 feet activity   Assist    Assist level: Independent with assistive device Assistive device: Walker-hemi    Walk 150 feet activity   Assist    Assist level: Supervision/Verbal cueing Assistive device: Walker-hemi    Walk 10 feet on uneven surface  activity   Assist Walk 10 feet on uneven surfaces activity did not occur: Safety/medical concerns   Assist level: Contact Guard/Touching assist Assistive device: Walker-hemi   Wheelchair     Assist Is the patient using a wheelchair?: No Type of Wheelchair: Manual    Wheelchair assist level: Set up assist Max wheelchair distance: 150    Wheelchair 50 feet with 2 turns  activity    Assist        Assist Level: Supervision/Verbal cueing   Wheelchair 150 feet activity     Assist      Assist Level: Supervision/Verbal cueing   Blood pressure 113/81, pulse (!) 120, temperature 98.9 F (37.2 C), resp. rate 16, height _0  (1.651 m), weight 56.7 kg, SpO2 97 %.  Medical Problem List and Plan: 1. 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 2.  Antithrombotics: -DVT/anticoagulation:  Pharmaceutical: Lovenox             -antiplatelet therapy: ASA 3. Chronic LBP/Pain Management:   --Oxycodone 10 mg has been effective in the past-   -receiving 5-46m scheduled at night as well as prn --Lidocaine patches at nights (discomfort due to bed).  -voltaren gel for right shoulder pain 2/21- denies pain, but using meds, mainly with therapy and at night- con't regimen  2/22- taking pain meds regularly- will d/c Friday- will con't meds- but will need to get from PCP after d/c. Controlled on 2/25 4. Mood: LCSW to follow for evaluation and support.              -antipsychotic agents: N/A 5. Neuropsych: This patient is  capable of making decisions on his own behalf. 6. Skin/Wound Care: Routine pressure relief measures.  7. Fluids/Electrolytes/Nutrition/FTT: encourage po  -I personally reviewed the patient's labs today.    2/9 mild hyponatremia--recheck Monday  2/20- Na 133- will con't to monitor  2/24- Na 136- doing better  -remeron/megace to help with appetite  -on D3 diet due to poorly fitting dentures  -intake is fair/inconsistent--continue to encourage  2/25-sodium 132 8. PML/Disseminated MAC: Continue Zithromax and Ethambutol.  9. 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 Continue Biktarvy             --on  Bactrim for PJP prophylaxis.  10. Pancytopenia: Neutropenia with WBC stable at 2.8K             -anemia slightly improved at 9.1   2/24- WBC 3.9 and Hb up to 9.4- doing better 11. Insomnia: Encouraged use of Remeron and Seroquel also to help with sleep.  Increased Remeron to 170m          improved 2/17  2/21- improved sleep- con't regimen 12. Tachycardia: Monitor HR/BP TID- Monitor HR with activity, EKG with sinus tach     Vitals:   08/06/21 0441 08/06/21 0733  BP: 113/81   Pulse: (!) 120   Resp: 16   Temp: 98.9 F (37.2 C)   SpO2: 97%  97%  BPs on low side so hesistant in starting BB, asymptomatic, may have deconditioning component, asymptomatic asymptomatic- so con't off BB  2/22- pulse better this AM- 75-99- con't off BB  2/23- HR 101-121 this AM- more elevated than normal- however asymptomatic- con't off b Blocker due to BP  2/24- asymptomatic, however is dry which can make tachycardia worse- will d/w team if will add b blocker.   2/25-ECG showing sinus tachycardia, stable-we will need close monitoring as outpatient-reviewed previous documentation showing fluctuations.  Coreg increased to 6.25. 14. Abnormal LFTs: Continue to hold Lipitor. Will hold motrin also for now.  -  LFTs stable, sl improved 2/9.   15. COPD: Continue MDI. No wheezing  16. Dispo 2/22- will likely best be helped by Dr Letta Pate or Dr Ranell Patrick at d/c for Botox possible of R side. Since Dr Letta Pate has seen pt- will arrange f/u with him, if it's OK with him  LOS: 17 days A FACE TO FACE EVALUATION WAS PERFORMED  Maiana Hennigan Lorie Phenix 08/06/2021, 9:20 AM

## 2021-08-06 NOTE — Progress Notes (Addendum)
Inpatient Rehabilitation Discharge Medication Review by a Pharmacist  A complete drug regimen review was completed for this patient to identify any potential clinically significant medication issues.  High Risk Drug Classes Is patient taking? Indication by Medication  Antipsychotic Yes Seroquel- sleep/MDD adjunctive therapy   Anticoagulant No   Antibiotic Yes Azithromycin, ethambutol- disseminated MAC   Bactrim- PJP prophylaxis  Biktarvy- HIV  Opioid Yes OxyIR- acute pain  Antiplatelet Yes Aspirin- CVA prophylaxis  Hypoglycemics/insulin No   Vasoactive Medication Yes Carvedilol- HR control  Chemotherapy No   Other Yes Megestrol- cachexia Remeron- sleep/MDD Protonix- GERD     Type of Medication Issue Identified Description of Issue Recommendation(s)  Drug Interaction(s) (clinically significant)     Duplicate Therapy     Allergy     No Medication Administration End Date     Incorrect Dose     Additional Drug Therapy Needed     Significant med changes from prior encounter (inform family/care partners about these prior to discharge).    Other       Patient's discharge was held yesterday due to AKI. Plan to discharge today. Carvedilol added to discharge medication review.   Clinically significant medication issues were identified that warrant physician communication and completion of prescribed/recommended actions by midnight of the next day:  No  Name of provider notified for urgent issues identified:   Provider Method of Notification:     Pharmacist comments:   Time spent performing this drug regimen review (minutes):  [place], [place].D. PGY-1 Pharmacy Resident NPYYF:[phone] 08/06/2021 9:38 AM

## 2021-08-08 ENCOUNTER — Other Ambulatory Visit (HOSPITAL_COMMUNITY): Payer: Self-pay

## 2021-08-08 DIAGNOSIS — R7989 Other specified abnormal findings of blood chemistry: Secondary | ICD-10-CM

## 2021-08-08 DIAGNOSIS — D61818 Other pancytopenia: Secondary | ICD-10-CM | POA: Insufficient documentation

## 2021-08-08 DIAGNOSIS — G4700 Insomnia, unspecified: Secondary | ICD-10-CM

## 2021-08-08 DIAGNOSIS — D709 Neutropenia, unspecified: Secondary | ICD-10-CM

## 2021-08-09 ENCOUNTER — Ambulatory Visit (HOSPITAL_COMMUNITY): Payer: No Payment, Other | Admitting: Licensed Clinical Social Worker

## 2021-08-09 ENCOUNTER — Telehealth: Payer: Self-pay

## 2021-08-09 NOTE — Telephone Encounter (Signed)
Received call from Thayer Ohm, PT with [facility]. He wanted to make our office aware that patient presented today for PT with a temp 99.7 and resting HR 147.  Thayer Ohm has predominately worked with the patient in regards to his hand a few months ago and was unaware of the progressive decline the patient has suffered. He will work with [facility]urc[facility] Bash today, but will consider referring him to their neuro PT in the future. Thayer Ohm states he has advised the patient to go to the emergency department if his fever worsens or HR doesn't improve.   Sandie Ano, RN

## 2021-08-10 ENCOUNTER — Inpatient Hospital Stay: Payer: Self-pay | Admitting: Nurse Practitioner

## 2021-08-16 ENCOUNTER — Encounter: Payer: Medicaid Other | Attending: Registered Nurse | Admitting: Registered Nurse

## 2021-08-16 ENCOUNTER — Other Ambulatory Visit: Payer: Self-pay

## 2021-08-16 ENCOUNTER — Encounter: Payer: Self-pay | Admitting: Registered Nurse

## 2021-08-16 VITALS — BP 124/81 | HR 110 | Temp 97.7°F | Ht 65.0 in | Wt 129.4 lb

## 2021-08-16 DIAGNOSIS — B20 Human immunodeficiency virus [HIV] disease: Secondary | ICD-10-CM

## 2021-08-16 DIAGNOSIS — G0490 Encephalitis and encephalomyelitis, unspecified: Secondary | ICD-10-CM | POA: Diagnosis present

## 2021-08-16 DIAGNOSIS — R627 Adult failure to thrive: Secondary | ICD-10-CM | POA: Diagnosis not present

## 2021-08-16 DIAGNOSIS — R5381 Other malaise: Secondary | ICD-10-CM

## 2021-08-16 DIAGNOSIS — R000 Tachycardia, unspecified: Secondary | ICD-10-CM | POA: Diagnosis present

## 2021-08-16 NOTE — Progress Notes (Signed)
? ?Subjective:  ? ? Patient ID: Kurt Foley, male    DOB: May 28, 1976, 46 y.o.   MRN: [id number] ? ?HPI: Kurt Foley is a 46 y.o. male who is here for HFU appointment for follow up of his Encephalitis, Debility, Failure to Thrive in Adult.and AIDS He went to [facility] ED on 07/13/2021 for leg pain .  ? ?Dr Erlinda Hong on 07/22/2021  ?H&P ?HPI: Kurt Foley is a 46 y.o. 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 ?Ophthalmology and ID Consulted  ? ?CT Head WO Contrast:  ?IMPRESSION: ?1. No acute abnormality. ?2. 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 ?3. Stable old left thalamic lacunar infarct and minimal chronic ?small vessel white matter ischemic changes in both cerebral ?hemispheres. ? ?MRI: Brain ?IMPRESSION: ?Increased extent of left frontoparietal abnormal signal with ?enhancement at the deep margin as before. Appearance is consistent ?with reported biopsy result of PML. ?  ?No new abnormal signal or enhancement elsewhere in the brain. ? ?MR: Lumbar ?IMPRESSION: ?1. No spinal canal stenosis or neural foraminal narrowing. No ?evidence of cauda equina syndrome. ?2. No abnormal osseous, conus, or cauda equina enhancement. ?3. Transitional anatomy, with sacralization of L5. Please correlate ?with imaging if any intervention is planned. ?  ?Kurt Foley was admitted to inpatient Rehabilitation on 07/20/2021 and discharged home on 08/06/2021. He will continue with HEP as tolerated.  ?He denies any pain at this time. He rated his pain 8 on Health and History. He states his appetite is good.   ? ?He arrived to office with tachycardia, apical pulse checked ; He is compliant with his medication. He has a scheduled appointment with PCP on 08/17/2021. ? ?This provider placed a call to [place] was able to obtain [facility] appointment for 08/17/2021. Spoke with Kurt Foley mother regarding the appointment, she verbalizes understanding.  ?  ? ?Pain Inventory ?Average Pain 8 ?Pain Right Now 8 ?My pain is aching ? ?In the last 24 hours, has pain interfered with the following? ?General activity 6 ?Relation with others 6 ?Enjoyment of life 8 ?What TIME of day is your pain at its worst? night ?Sleep (in general) Fair ? ?Pain is worse with: walking, bending, sitting, inactivity, and standing ?Pain improves with: therapy/exercise and medication ?Relief from Meds:  na ? ?use a walker ?ability to climb steps?  yes ?do you drive?  no ? ?disabled: date disabled 10/22 ?I need assistance with the following:  dressing, bathing, meal prep, household duties, and shopping ? ?trouble walking ?confusion ? ?Any changes since last visit?  no ? ?Any changes since last visit?  no ? ? ? ?Family History  ?Problem Relation Age of Onset  ? Rheum arthritis Mother   ? Diabetes Mother   ? Hypertension Mother   ? Arthritis Mother   ? ?Social History  ? ?Socioeconomic History  ? Marital status: Single  ?  Spouse name: Not on file  ? Number of children: Not on file  ? Years of education: Not on file  ? Highest education level: Not on file  ?Occupational History  ? Not  on file  ?Tobacco Use  ? Smoking status: Former  ?  Packs/day: 0.50  ?  Years: 25.00  ?  Pack years: 12.50  ?  Types: Cigarettes  ? Smokeless tobacco: Never  ? Tobacco comments:  ?  cutting back  ?Vaping Use  ? Vaping Use: Never used  ?Substance and Sexual Activity  ? Alcohol use: Not Currently  ?  Alcohol/week: 2.0 standard drinks  ?  Types: 2 Standard drinks or equivalent per week  ?  Comment: 3-4 days a week.Last drink: today  ? Drug use: Not Currently  ?   Types: Marijuana, Cocaine  ?  Comment: Once a week. Last used yesterday.  Cocaine last used today.   ? Sexual activity: Not Currently  ?  Partners: Male  ?  Comment: encouraged condom use  ?Other Topics Concern  ? Not on file  ?Social History Narrative  ? Not on file  ? ?Social Determinants of Health  ? ?Financial Resource Strain: Not on file  ?Food Insecurity: Not on file  ?Transportation Needs: Not on file  ?Physical Activity: Not on file  ?Stress: Not on file  ?Social Connections: Not on file  ? ?No past surgical history on file. ?Past Medical History:  ?Diagnosis Date  ? Depression   ? HIV infection ([place])   ? ?BP 124/81   Pulse (!) 120   Temp 97.7 ?F (36.5 ?C)   Ht _0  (1.651 m)   Wt 129 lb 6.4 oz (58.7 kg)   SpO2 97%   BMI 21.53 kg/m?  ? ?Opioid Risk Score:   ?Fall Risk Score:  `1 ? ?Depression screen PHQ 2/9 ? ?Depression screen [facility] 2/9 08/16/2021 07/08/2021 05/25/2021 04/15/2020 06/15/2016 09/22/2014 08/31/2014  ?Decreased Interest 1 0 0 2 1 0 0  ?Down, Depressed, Hopeless 1 1 0 _1 0  ?PHQ - 2 Score 2 1 0 _2 0  ?Altered sleeping 1 - - 3 0 - -  ?Tired, decreased energy 1 - - 2 1 - -  ?Change in appetite 0 - - 3 1 - -  ?Feeling bad or failure about yourself  0 - - 2 1 - -  ?Trouble concentrating 0 - - 0 0 - -  ?Moving slowly or fidgety/restless 3 - - 0 1 - -  ?Suicidal thoughts 0 - - 1 0 - -  ?PHQ-9 Score 7 - - 15 6 - -  ?Difficult doing work/chores Somewhat difficult - - Somewhat difficult Somewhat difficult - -  ?  ? ? ?Review of Systems  ?Constitutional:  Positive for chills.  ?HENT: Negative.    ?Eyes: Negative.   ?Respiratory:  Positive for shortness of breath.   ?Cardiovascular: Negative.   ?Gastrointestinal: Negative.   ?Endocrine: Negative.   ?Genitourinary: Negative.   ?Musculoskeletal:  Positive for gait problem.  ?Skin:  Positive for rash.  ?Allergic/Immunologic: Negative.   ?Hematological: Negative.   ?Psychiatric/Behavioral:  Positive for confusion and dysphoric mood.   ?All other systems  reviewed and are negative. ? ?   ?Objective:  ? Physical Exam ?Vitals and nursing note reviewed.  ?Constitutional:   ?   Appearance: Normal appearance.  ?Cardiovascular:  ?   Rate and Rhythm: Tachycardia present.  ?   Pulses: Normal pulses.  ?   Heart sounds: Normal heart sounds.  ?Pulmonary:  ?   Effort: Pulmonary effort is normal.  ?   Breath sounds: Normal breath sounds.  ?Musculoskeletal:  ?   Cervical back: Normal range  of motion and neck supple.  ?   Comments: Normal Muscle Bulk and Muscle Testing Reveals:  ?Upper Extremities:Right Upper Extremity: Hemiplegia ?Left Upper Extremity: Full ROM and Muscle Strength 5/5 ?Lower Extremities: Right: Decreased ROM and Muscle Strength 4/5 ?Wearing AFO ?Left Upper Extremity Full ROM and Muscle Strength 5/5 ?Arises from Table slowly using Hemi-Walker for support ?Wide based  Gait  ?   ?Skin: ?   General: Skin is warm and dry.  ?Neurological:  ?   Mental Status: He is alert and oriented to person, place, and time.  ?Psychiatric:     ?   Mood and Affect: Mood normal.     ?   Behavior: Behavior normal.  ? ? ? ? ?   ?Assessment & Plan:  ?Encephalitis, Debility, Failure to Thrive in Adult.and AIDS : ID Following: Continue HEP as Tolerated. Continue current medication regimen. Continue to Monitor.  ?Tachycardia: Compliant with medication. He has a scheduled appointment with PCP on 08/17/2021.  ? ?F/u with Dr Dagoberto Ligas in 4- 6 weeks  ? ?

## 2021-08-17 ENCOUNTER — Encounter: Payer: Self-pay | Admitting: Internal Medicine

## 2021-08-17 ENCOUNTER — Inpatient Hospital Stay: Payer: Self-pay | Admitting: Internal Medicine

## 2021-08-17 ENCOUNTER — Encounter: Payer: Self-pay | Admitting: Nurse Practitioner

## 2021-08-17 ENCOUNTER — Other Ambulatory Visit: Payer: Self-pay

## 2021-08-17 ENCOUNTER — Ambulatory Visit (INDEPENDENT_AMBULATORY_CARE_PROVIDER_SITE_OTHER): Payer: Self-pay | Admitting: Internal Medicine

## 2021-08-17 ENCOUNTER — Ambulatory Visit: Payer: Medicaid Other | Attending: Nurse Practitioner | Admitting: Nurse Practitioner

## 2021-08-17 VITALS — BP 135/88 | HR 119 | Temp 98.0°F | Wt 129.0 lb

## 2021-08-17 VITALS — BP 126/95 | HR 111 | Resp 16 | Ht 65.0 in | Wt 129.2 lb

## 2021-08-17 DIAGNOSIS — Z1211 Encounter for screening for malignant neoplasm of colon: Secondary | ICD-10-CM | POA: Diagnosis not present

## 2021-08-17 DIAGNOSIS — R000 Tachycardia, unspecified: Secondary | ICD-10-CM | POA: Diagnosis not present

## 2021-08-17 DIAGNOSIS — Z9189 Other specified personal risk factors, not elsewhere classified: Secondary | ICD-10-CM

## 2021-08-17 DIAGNOSIS — I10 Essential (primary) hypertension: Secondary | ICD-10-CM | POA: Diagnosis not present

## 2021-08-17 DIAGNOSIS — B20 Human immunodeficiency virus [HIV] disease: Secondary | ICD-10-CM

## 2021-08-17 DIAGNOSIS — E782 Mixed hyperlipidemia: Secondary | ICD-10-CM

## 2021-08-17 DIAGNOSIS — A812 Progressive multifocal leukoencephalopathy: Secondary | ICD-10-CM

## 2021-08-17 DIAGNOSIS — Z7689 Persons encountering health services in other specified circumstances: Secondary | ICD-10-CM | POA: Diagnosis not present

## 2021-08-17 MED ORDER — METOPROLOL SUCCINATE ER 50 MG PO TB24
50.0000 mg | ORAL_TABLET | Freq: Every day | ORAL | 3 refills | Status: DC
  Filled 2021-08-17: qty 30, 30d supply, fill #0
  Filled 2021-09-20 – 2021-09-30 (×2): qty 30, 30d supply, fill #1

## 2021-08-17 MED ORDER — ROSUVASTATIN CALCIUM 5 MG PO TABS
5.0000 mg | ORAL_TABLET | Freq: Every day | ORAL | 3 refills | Status: AC
  Filled 2021-08-17: qty 30, 30d supply, fill #0
  Filled 2021-09-20 – 2021-09-30 (×2): qty 30, 30d supply, fill #1

## 2021-08-17 NOTE — Assessment & Plan Note (Signed)
His viral load is suppressed and CD4 starting to come up.  I discussed with his mother and him that this will be his best chance to stabilize his PML and motor issues and reeducated him that he will not get better and likely decline over time but trying to get him to a more stable condition with his PML with good immunoreconstitution.   ?Continue Biktarvy and will check his labs today ?Follow up in 2 months.  ? ?I have personally spent 40 minutes involved in face-to-face and non-face-to-face activities for this patient on the day of the visit. Professional time spent includes the following activities: Preparing to see the patient (review of tests), Obtaining and/or reviewing separately obtained history (admission/discharge record), Performing a medically appropriate examination and/or evaluation , Ordering medications/tests/procedures, referring and communicating with other health care professionals, Documenting clinical information in the EMR, Independently interpreting results (not separately reported), Communicating results to the patient/family/caregiver, Counseling and educating the patient/family/caregiver and Care coordination (not separately reported).  ?

## 2021-08-17 NOTE — Patient Instructions (Signed)
Take two extra strength Tylenol at night to alleviate pain ?

## 2021-08-17 NOTE — Assessment & Plan Note (Signed)
Has been to rehab recently and has some improvement in his motor function.  He will need to continue with physical medicine and rehab.   ?

## 2021-08-17 NOTE — Assessment & Plan Note (Signed)
He will continue with Biktarvy ?

## 2021-08-17 NOTE — Progress Notes (Signed)
? ?Assessment & Plan:  ?Kurt Foley was seen today for establish care, hypertension and hyperlipidemia. ? ?Diagnoses and all orders for this visit: ? ?Encounter to establish care ? ?Primary hypertension ?-     metoprolol succinate (TOPROL-XL) 50 MG 24 hr tablet; Take 1 tablet (50 mg total) by mouth daily. Take with or immediately following a meal. NEEDS PASS ?-     CMP14+EGFR; Future ? ?Colon cancer screening ?-     Fecal occult blood, imunochemical([facility]) ? ?Tachycardia ?-     metoprolol succinate (TOPROL-XL) 50 MG 24 hr tablet; Take 1 tablet (50 mg total) by mouth daily. Take with or immediately following a meal. NEEDS PASS ? ?Mixed hyperlipidemia ?-     rosuvastatin (CRESTOR) 5 MG tablet; Take 1 tablet (5 mg total) by mouth daily. ? ? ? ?Patient has been counseled on age-appropriate routine health concerns for screening and prevention. These are reviewed and up-to-date. Referrals have been placed accordingly. Immunizations are up-to-date or declined.    ?Subjective:  ? ?Chief Complaint  ?Patient presents with  ? Establish Care  ? Hypertension  ? Hyperlipidemia  ? ?HPI ?Kurt Foley 46 y.o. male presents to office today to establish care.  ?He has a PMH of HIV, MAI infection, PML ?Being followed by ID and PMR.  ? ?HTN ?Slightly elevated today and also tachycardic. Will switch carvedilol to Toprol XL 50 mg daily.  ?BP Readings from Last 3 Encounters:  ?08/17/21 135/88  ?08/17/21 (!) 126/95  ?08/16/21 124/81  ?  ?Dyslipidemia ?LDL not at goal. Started on low dose crestor today.  ?Lab Results  ?Component Value Date  ? CHOL 91 02/24/2021  ? CHOL 158 07/01/2019  ? CHOL 200 07/29/2015  ? ?Lab Results  ?Component Value Date  ? HDL 11 (L) 02/24/2021  ? HDL 32 (L) 07/01/2019  ? HDL 48 07/29/2015  ? ?Lab Results  ?Component Value Date  ? LDLCALC 54 02/24/2021  ? LDLCALC 101 (H) 07/01/2019  ? [place] 118 07/29/2015  ? ?Lab Results  ?Component Value Date  ? TRIG 185 (H) 02/24/2021  ? TRIG 150 (H) 07/01/2019  ?  TRIG 169 (H) 07/29/2015  ? ?Lab Results  ?Component Value Date  ? CHOLHDL 8.3 (H) 02/24/2021  ? CHOLHDL 4.9 07/01/2019  ? CHOLHDL 4.2 07/29/2015  ? ? ?Review of Systems  ?Constitutional:  Negative for fever, malaise/fatigue and weight loss.  ?HENT: Negative.  Negative for nosebleeds.   ?Eyes: Negative.  Negative for blurred vision, double vision and photophobia.  ?Respiratory: Negative.  Negative for cough and shortness of breath.   ?Cardiovascular: Negative.  Negative for chest pain, palpitations and leg swelling.  ?Gastrointestinal: Negative.  Negative for heartburn, nausea and vomiting.  ?Musculoskeletal: Negative.  Negative for myalgias.  ?Neurological: Negative.  Negative for dizziness, focal weakness, seizures and headaches.  ?Psychiatric/Behavioral: Negative.  Negative for suicidal ideas.   ? ?Past Medical History:  ?Diagnosis Date  ? Depression   ? HIV infection ([place])   ? ? ?No past surgical history on file. ? ?Family History  ?Problem Relation Age of Onset  ? Rheum arthritis Mother   ? Diabetes Mother   ? Hypertension Mother   ? Arthritis Mother   ? ? ?Social History Reviewed with no changes to be made today.  ? ?Outpatient Medications Prior to Visit  ?Medication Sig Dispense Refill  ? acetaminophen (TYLENOL) 325 MG tablet Take 1-2 tablets (325-650 mg total) by mouth every 4 (four) hours as needed for mild pain. 300 tablet 0  ?  aspirin 81 MG chewable tablet Chew 1 tablet (81 mg total) by mouth daily. (Patient not taking: Reported on 08/17/2021) 30 tablet 0  ? azithromycin (ZITHROMAX) 500 MG tablet Take 1 tablet (500 mg total) by mouth at bedtime. 30 tablet 0  ? bictegravir-emtricitabine-tenofovir AF (BIKTARVY) 50-200-25 MG TABS tablet Take 1 tablet by mouth daily. 30 tablet 11  ? diclofenac Sodium (VOLTAREN) 1 % GEL Apply 2 g topically 4 (four) times daily. (Patient not taking: Reported on 08/17/2021) 400 g 0  ? ethambutol (MYAMBUTOL) 400 MG tablet Take 2 tablets (800 mg total) by mouth daily. 60 tablet 0  ?  fluticasone furoate-vilanterol (BREO ELLIPTA) 100-25 MCG/ACT AEPB Inhale 1 puff into the lungs daily. 60 each 5  ? megestrol (MEGACE) 400 MG/10ML suspension Take 10 mLs (400 mg total) by mouth daily. 240 mL 0  ? mirtazapine (REMERON) 15 MG tablet Take 1 tablet (15 mg total) by mouth at bedtime. 30 tablet 5  ? Multiple Vitamins-Minerals (CERTAVITE/ANTIOXIDANTS) TABS Take 1 tablet by mouth daily. 30 tablet 0  ? Oxycodone HCl 10 MG TABS Take 0.5-1 tablets (5-10 mg total) by mouth every 6 (six) hours as needed for severe pain. (Patient not taking: Reported on 08/17/2021) 20 tablet 0  ? pantoprazole (PROTONIX) 40 MG tablet Take 1 tablet (40 mg total) by mouth daily. 30 tablet 0  ? polyethylene glycol (MIRALAX / GLYCOLAX) 17 g packet Take 17 g by mouth daily as needed for mild constipation or moderate constipation. (Patient not taking: Reported on 08/17/2021)  0  ? QUEtiapine (SEROQUEL) 200 MG tablet Take 1 tablet (200 mg total) by mouth at bedtime. 30 tablet 5  ? senna-docusate (SENOKOT-S) 8.6-50 MG tablet Take 1 tablet by mouth 2 (two) times daily. 60 tablet 0  ? sulfamethoxazole-trimethoprim (BACTRIM DS) 800-160 MG tablet Take 1 tablet by mouth daily. (Patient not taking: Reported on 08/17/2021) 30 tablet 11  ? umeclidinium bromide (INCRUSE ELLIPTA) 62.5 MCG/ACT AEPB Inhale 1 puff into the lungs daily. 30 each 5  ? carvedilol (COREG) 6.25 MG tablet Take 1 tablet (6.25 mg total) by mouth 2 (two) times daily with a meal. 60 tablet 0  ? ?No facility-administered medications prior to visit.  ? ? ?Allergies  ?Allergen Reactions  ? Lidocaine Other (See Comments)  ?  Large bruise at site of patch placement  ? ? ?   ?Objective:  ?  ?BP (!) 126/95   Pulse (!) 111   Resp 16   Ht $R'5\' 5"'Oa$  (1.651 m)   Wt 129 lb 4 oz (58.6 kg)   SpO2 100%   BMI 21.51 kg/m?  ?Wt Readings from Last 3 Encounters:  ?08/17/21 129 lb (58.5 kg)  ?08/17/21 129 lb 4 oz (58.6 kg)  ?08/16/21 129 lb 6.4 oz (58.7 kg)  ? ? ?Physical Exam ?Vitals and nursing note  reviewed.  ?Constitutional:   ?   Appearance: He is well-developed.  ?HENT:  ?   Head: Normocephalic and atraumatic.  ?Cardiovascular:  ?   Rate and Rhythm: Regular rhythm. Bradycardia present.  ?   Heart sounds: Normal heart sounds. No murmur heard. ?  No friction rub. No gallop.  ?Pulmonary:  ?   Effort: Pulmonary effort is normal. No tachypnea or respiratory distress.  ?   Breath sounds: Normal breath sounds. No decreased breath sounds, wheezing, rhonchi or rales.  ?Chest:  ?   Chest wall: No tenderness.  ?Abdominal:  ?   General: Bowel sounds are normal.  ?   Palpations: Abdomen  is soft.  ?Musculoskeletal:     ?   General: Normal range of motion.  ?   Cervical back: Normal range of motion.  ?Skin: ?   General: Skin is warm and dry.  ?Neurological:  ?   Mental Status: He is alert and oriented to person, place, and time.  ?   Coordination: Coordination normal.  ?Psychiatric:     ?   Behavior: Behavior normal. Behavior is cooperative.     ?   Thought Content: Thought content normal.     ?   Judgment: Judgment normal.  ? ? ? ? ?   ?Patient has been counseled extensively about nutrition and exercise as well as the importance of adherence with medications and regular follow-up. The patient was given clear instructions to go to ER or return to medical center if symptoms don't improve, worsen or new problems develop. The patient verbalized understanding.  ? ?Follow-up: Return for BP CHECK WITH [facility] in 2-3 weeks/CMP. See me in 3 months.  ? ?Gildardo Pounds, FNP-BC ?[facility] ?[place], [place] ?[phone]   ?08/17/2021, 10:14 PM ?

## 2021-08-17 NOTE — Progress Notes (Signed)
? ?  Subjective:  ? ? Patient ID: RIDWAN BONDY, male    DOB: [birth year], 46 y.o.   MRN: [id number] ? ?HPI ?Here for follow up of HIV ?He continues on Biktarvy and no missed doses.  He is here with his mother today.  His last CD4 count was up to 37 from <35 and viral load < 20.  He complains of a headache today that has been ongoing for weeks and interferes with sleep.  He has gained some weight.  Has been to rehab recently and now in primary care.  Eating better with megace.   ? ? ?Review of Systems  ?Constitutional:  Negative for appetite change.  ?Gastrointestinal:  Negative for diarrhea and nausea.  ?Skin:  Negative for rash.  ?Psychiatric/Behavioral:  Positive for sleep disturbance.   ? ?   ?Objective:  ? Physical Exam ?Eyes:  ?   General: No scleral icterus. ?Pulmonary:  ?   Effort: Pulmonary effort is normal.  ?Skin: ?   Findings: No rash.  ?Neurological:  ?   Mental Status: He is alert.  ?Psychiatric:     ?   Mood and Affect: Mood normal.  ? ?SH: no current drug use ? ? ? ?   ?Assessment & Plan:  ? ? ?

## 2021-08-19 ENCOUNTER — Other Ambulatory Visit: Payer: Self-pay

## 2021-08-19 LAB — T-HELPER CELLS (CD4) COUNT (NOT AT ARMC)
Absolute CD4: 42 cells/uL — ABNORMAL LOW (ref 490–1740)
CD4 T Helper %: 7 % — ABNORMAL LOW (ref 30–61)
Total lymphocyte count: 601 cells/uL — ABNORMAL LOW (ref 850–3900)

## 2021-08-19 LAB — HIV-1 RNA QUANT-NO REFLEX-BLD
HIV 1 RNA Quant: 27 Copies/mL — ABNORMAL HIGH
HIV-1 RNA Quant, Log: 1.43 Log cps/mL — ABNORMAL HIGH

## 2021-09-01 ENCOUNTER — Other Ambulatory Visit (HOSPITAL_COMMUNITY): Payer: Self-pay

## 2021-09-04 LAB — ACID FAST CULTURE WITH REFLEXED SENSITIVITIES (MYCOBACTERIA): Acid Fast Culture: NEGATIVE

## 2021-09-06 ENCOUNTER — Inpatient Hospital Stay: Payer: Self-pay | Admitting: Internal Medicine

## 2021-09-09 NOTE — Progress Notes (Signed)
? ?S:    ? ?Kurt Foley is a 46 y.o. male who presents for hypertension evaluation, education, and management. PMH is significant for CVA, HIV, HTN, HLD, PML, MAI infection. Patient was referred and last seen by Primary Care Provider, Bertram Denver, NP, on 08/17/21 to establish care. At last visit, BP was 126/95, HR was 111, and metoprolol was started.  ? ?Today, patient arrives in good spirits and presents without assistance and accompanied by his mother. Denies dizziness, blurred vision, swelling. Reports headaches at night. Reports that he is still having pain on his head after having a brain biopsy. Also reports he has a new cough that started a couple weeks ago though he is not coughing during today's visit.  ? ?Family/Social history:  ?-DM, HTN in mother ?-Hx tobacco use ? ?Medication adherence reported. No missed doses in the past week. Patient has not taken metoprolol today - normally takes it after breakfast. Reports he did take it yesterday.  ? ?Current antihypertensives include: metoprolol succinate 50 mg daily ? ?Antihypertensives tried in the past include: carvedilol ? ?Reported home BP readings: does not check at home ? ?Patient reported dietary habits: Reports he is on a "regular diet." Does not try to avoid salt.  ? ?Patient-reported exercise habits: Tries to exercise his leg. Does physical therapy once per week.  ? ?ASCVD risk factors include: HTN, HLD, HIV, hx CVA ? ?O:  ? ?Last 3 Office BP readings: ?BP Readings from Last 3 Encounters:  ?08/17/21 135/88  ?08/17/21 (!) 126/95  ?08/16/21 124/81  ? ? ?BMET ?   ?Component Value Date/Time  ? NA 132 (L) 08/06/2021 9381  ? K 4.2 08/06/2021 0652  ? CL 105 08/06/2021 0652  ? CO2 17 (L) 08/06/2021 0175  ? GLUCOSE 109 (H) 08/06/2021 1025  ? BUN 20 08/06/2021 0652  ? CREATININE 0.81 08/06/2021 0652  ? CREATININE 1.00 07/08/2021 1215  ? CALCIUM 9.2 08/06/2021 0652  ? GFRNONAA >60 08/06/2021 8527  ? GFRNONAA 110 08/06/2019 1625  ? GFRAA 127 08/06/2019 1625   ? ? ?Renal function: ?CrCl cannot be calculated (Patient's most recent lab result is older than the maximum 21 days allowed.). ? ?Clinical ASCVD: Yes  ?The ASCVD Risk score (Arnett DK, et al., 2019) failed to calculate for the following reasons: ?  The patient has a prior MI or stroke diagnosis ? ?A/P: ?Hypertension diagnosed currently close to goal on current medications. BP goal < 130/80 mmHg. Medication adherence appears appropriate. Diastolic BP has improved to <90 mmHg since last visit after addition of metoprolol. Though would prefer it is <80 mmHg, worry that addition of another antihypertensive would drop his systolic BP too low and cause hypotensive symptoms, which we want to avoid particularly since his gait is unsteady. He also has not taken his metoprolol today so BP may actually be lower than it is in the office today.  ?-Continue metoprolol succinate 50 mg daily.  ?-F/u labs ordered - CMP today (ordered previously by Zel[facility]) ?-Counseled on lifestyle modifications for blood pressure control including reduced dietary sodium, increased exercise, adequate sleep. ?-Encouraged patient to check BP at home and bring log of readings to next visit. Counseled on proper use of home BP cuff.  ? ?Results reviewed and written information provided. Patient verbalized understanding of treatment plan. Total time in face-to-face counseling 30 minutes.  ? ?F/u clinic visit in 4 weeks with [facility]. Next PCP visit 11/18/21. Encouraged him to try to move up his PCP appt given reported new  cough.  ? ?Pervis Hocking, PharmD ?PGY2 Ambulatory Care Pharmacy Resident ?09/12/2021 9:43 AM ?

## 2021-09-12 ENCOUNTER — Ambulatory Visit: Payer: Medicaid Other | Attending: Nurse Practitioner | Admitting: Pharmacist

## 2021-09-12 ENCOUNTER — Encounter: Payer: Self-pay | Admitting: Pharmacist

## 2021-09-12 VITALS — BP 121/88 | HR 104

## 2021-09-12 DIAGNOSIS — I10 Essential (primary) hypertension: Secondary | ICD-10-CM

## 2021-09-13 LAB — CMP14+EGFR
ALT: 93 IU/L — ABNORMAL HIGH (ref 0–44)
AST: 42 IU/L — ABNORMAL HIGH (ref 0–40)
Albumin/Globulin Ratio: 1.1 — ABNORMAL LOW (ref 1.2–2.2)
Albumin: 4 g/dL (ref 4.0–5.0)
Alkaline Phosphatase: 277 IU/L — ABNORMAL HIGH (ref 44–121)
BUN/Creatinine Ratio: 17 (ref 9–20)
BUN: 14 mg/dL (ref 6–24)
Bilirubin Total: <0.2 mg/dL (ref 0.0–1.2)
CO2: 17 mmol/L — ABNORMAL LOW (ref 20–29)
Calcium: 9.7 mg/dL (ref 8.7–10.2)
Chloride: 107 mmol/L — ABNORMAL HIGH (ref 96–106)
Creatinine, Ser: 0.83 mg/dL (ref 0.76–1.27)
Globulin, Total: 3.6 g/dL (ref 1.5–4.5)
Glucose: 121 mg/dL — ABNORMAL HIGH (ref 70–99)
Potassium: 4.2 mmol/L (ref 3.5–5.2)
Sodium: 141 mmol/L (ref 134–144)
Total Protein: 7.6 g/dL (ref 6.0–8.5)
eGFR: 109 mL/min/{1.73_m2} (ref >59)

## 2021-09-14 ENCOUNTER — Ambulatory Visit (INDEPENDENT_AMBULATORY_CARE_PROVIDER_SITE_OTHER): Payer: Self-pay | Admitting: Internal Medicine

## 2021-09-14 ENCOUNTER — Other Ambulatory Visit: Payer: Self-pay

## 2021-09-14 ENCOUNTER — Other Ambulatory Visit: Payer: Self-pay | Admitting: Nurse Practitioner

## 2021-09-14 ENCOUNTER — Ambulatory Visit
Admission: RE | Admit: 2021-09-14 | Discharge: 2021-09-14 | Disposition: A | Discharge status: Home / Self Care | Payer: Self-pay | Source: Ambulatory Visit | Attending: Internal Medicine | Admitting: Internal Medicine

## 2021-09-14 ENCOUNTER — Encounter: Payer: Self-pay | Admitting: Internal Medicine

## 2021-09-14 DIAGNOSIS — R052 Subacute cough: Secondary | ICD-10-CM

## 2021-09-14 DIAGNOSIS — Z1211 Encounter for screening for malignant neoplasm of colon: Secondary | ICD-10-CM

## 2021-09-14 DIAGNOSIS — J439 Emphysema, unspecified: Secondary | ICD-10-CM

## 2021-09-14 DIAGNOSIS — R748 Abnormal levels of other serum enzymes: Secondary | ICD-10-CM

## 2021-09-14 DIAGNOSIS — R059 Cough, unspecified: Secondary | ICD-10-CM | POA: Insufficient documentation

## 2021-09-14 MED ORDER — LORATADINE 10 MG PO TABS
10.0000 mg | ORAL_TABLET | Freq: Every day | ORAL | 6 refills | Status: AC
Start: 2021-09-14 — End: ?

## 2021-09-14 NOTE — Assessment & Plan Note (Signed)
This is a new issue.  Clinically sounds like seasonal allergies.  I will check a cxr to rule out anything in the chest.  Will prescribe claritin.   ?Follow up for this as needed ?

## 2021-09-14 NOTE — Progress Notes (Signed)
? ?  Subjective:  ? ? Patient ID: Kurt Foley, male    DOB: 02/15/1976, 46 y.o.   MRN: [id number] ? ?HPI ?He is here for a work in visit for a cough the last month ?He has HIV on Biktarvy and slow immunreconstitution and is here complaining of a non-productive cough, itchy and puffy eyes and nasal congestion.  Hard to sleep.  Worried about an infection with his poor immune system.  ? ? ?Review of Systems  ?Constitutional:  Negative for appetite change and chills.  ?Gastrointestinal:  Negative for diarrhea.  ?Skin:  Negative for rash.  ? ?   ?Objective:  ? Physical Exam ?Eyes:  ?   General: No scleral icterus. ?Pulmonary:  ?   Effort: Pulmonary effort is normal.  ?Skin: ?   Findings: No rash.  ?Neurological:  ?   General: No focal deficit present.  ?   Mental Status: He is alert.  ?Psychiatric:     ?   Mood and Affect: Mood normal.  ? ?SH; no current tobacco ? ? ? ?   ?Assessment & Plan:  ? ? ?

## 2021-09-14 NOTE — Assessment & Plan Note (Signed)
He is taking his inhalers well so his cough does not seem to be related to his underlying lung disease.   ?

## 2021-09-15 ENCOUNTER — Ambulatory Visit: Payer: Self-pay | Admitting: Internal Medicine

## 2021-09-21 ENCOUNTER — Other Ambulatory Visit: Payer: Self-pay

## 2021-09-28 ENCOUNTER — Other Ambulatory Visit: Payer: Self-pay

## 2021-09-30 ENCOUNTER — Other Ambulatory Visit: Payer: Self-pay

## 2021-10-03 ENCOUNTER — Other Ambulatory Visit: Payer: Self-pay

## 2021-10-06 ENCOUNTER — Emergency Department (HOSPITAL_COMMUNITY): Payer: Medicaid Other

## 2021-10-06 ENCOUNTER — Observation Stay (HOSPITAL_COMMUNITY)
Admission: EM | Admit: 2021-10-06 | Discharge: 2021-10-07 | Disposition: A | Discharge status: Home / Self Care | Payer: Medicaid Other | Attending: Student | Admitting: Student

## 2021-10-06 ENCOUNTER — Encounter (HOSPITAL_COMMUNITY): Payer: Self-pay

## 2021-10-06 ENCOUNTER — Other Ambulatory Visit: Payer: Self-pay

## 2021-10-06 DIAGNOSIS — Z87891 Personal history of nicotine dependence: Secondary | ICD-10-CM | POA: Insufficient documentation

## 2021-10-06 DIAGNOSIS — B20 Human immunodeficiency virus [HIV] disease: Secondary | ICD-10-CM | POA: Diagnosis not present

## 2021-10-06 DIAGNOSIS — J439 Emphysema, unspecified: Secondary | ICD-10-CM

## 2021-10-06 DIAGNOSIS — R918 Other nonspecific abnormal finding of lung field: Secondary | ICD-10-CM | POA: Diagnosis not present

## 2021-10-06 DIAGNOSIS — I10 Essential (primary) hypertension: Secondary | ICD-10-CM

## 2021-10-06 DIAGNOSIS — R7989 Other specified abnormal findings of blood chemistry: Secondary | ICD-10-CM | POA: Diagnosis present

## 2021-10-06 DIAGNOSIS — R0600 Dyspnea, unspecified: Secondary | ICD-10-CM | POA: Diagnosis not present

## 2021-10-06 DIAGNOSIS — R062 Wheezing: Secondary | ICD-10-CM

## 2021-10-06 DIAGNOSIS — I69351 Hemiplegia and hemiparesis following cerebral infarction affecting right dominant side: Secondary | ICD-10-CM | POA: Diagnosis not present

## 2021-10-06 DIAGNOSIS — R109 Unspecified abdominal pain: Secondary | ICD-10-CM | POA: Diagnosis not present

## 2021-10-06 DIAGNOSIS — J22 Unspecified acute lower respiratory infection: Secondary | ICD-10-CM

## 2021-10-06 DIAGNOSIS — Z8673 Personal history of transient ischemic attack (TIA), and cerebral infarction without residual deficits: Secondary | ICD-10-CM

## 2021-10-06 DIAGNOSIS — R000 Tachycardia, unspecified: Secondary | ICD-10-CM

## 2021-10-06 DIAGNOSIS — Z7982 Long term (current) use of aspirin: Secondary | ICD-10-CM | POA: Diagnosis not present

## 2021-10-06 DIAGNOSIS — R058 Other specified cough: Secondary | ICD-10-CM

## 2021-10-06 DIAGNOSIS — B354 Tinea corporis: Secondary | ICD-10-CM

## 2021-10-06 DIAGNOSIS — Z79899 Other long term (current) drug therapy: Secondary | ICD-10-CM | POA: Diagnosis not present

## 2021-10-06 DIAGNOSIS — B350 Tinea barbae and tinea capitis: Secondary | ICD-10-CM

## 2021-10-06 DIAGNOSIS — Z20822 Contact with and (suspected) exposure to covid-19: Secondary | ICD-10-CM | POA: Diagnosis not present

## 2021-10-06 DIAGNOSIS — R0602 Shortness of breath: Secondary | ICD-10-CM | POA: Diagnosis present

## 2021-10-06 DIAGNOSIS — A310 Pulmonary mycobacterial infection: Secondary | ICD-10-CM | POA: Diagnosis present

## 2021-10-06 DIAGNOSIS — A812 Progressive multifocal leukoencephalopathy: Secondary | ICD-10-CM | POA: Diagnosis present

## 2021-10-06 LAB — COMPREHENSIVE METABOLIC PANEL
ALT: 132 U/L — ABNORMAL HIGH (ref 0–44)
AST: 98 U/L — ABNORMAL HIGH (ref 15–41)
Albumin: 3.4 g/dL — ABNORMAL LOW (ref 3.5–5.0)
Alkaline Phosphatase: 294 U/L — ABNORMAL HIGH (ref 38–126)
Anion gap: 9 (ref 5–15)
BUN: 21 mg/dL — ABNORMAL HIGH (ref 6–20)
CO2: 16 mmol/L — ABNORMAL LOW (ref 22–32)
Calcium: 9.5 mg/dL (ref 8.9–10.3)
Chloride: 110 mmol/L (ref 98–111)
Creatinine, Ser: 0.97 mg/dL (ref 0.61–1.24)
GFR, Estimated: >60 mL/min (ref >60)
Glucose, Bld: 127 mg/dL — ABNORMAL HIGH (ref 70–99)
Potassium: 4.6 mmol/L (ref 3.5–5.1)
Sodium: 135 mmol/L (ref 135–145)
Total Bilirubin: 0.8 mg/dL (ref 0.3–1.2)
Total Protein: 8.5 g/dL — ABNORMAL HIGH (ref 6.5–8.1)

## 2021-10-06 LAB — RESP PANEL BY RT-PCR (FLU A&B, COVID) ARPGX2
Influenza A by PCR: NEGATIVE
Influenza B by PCR: NEGATIVE
SARS Coronavirus 2 by RT PCR: NEGATIVE

## 2021-10-06 LAB — MRSA NEXT GEN BY PCR, NASAL: MRSA by PCR Next Gen: NOT DETECTED

## 2021-10-06 LAB — RAPID URINE DRUG SCREEN, HOSP PERFORMED
Amphetamines: NOT DETECTED
Barbiturates: NOT DETECTED
Benzodiazepines: NOT DETECTED
Cocaine: NOT DETECTED
Opiates: NOT DETECTED
Tetrahydrocannabinol: NOT DETECTED

## 2021-10-06 LAB — URINALYSIS, ROUTINE W REFLEX MICROSCOPIC
Bacteria, UA: NONE SEEN
Bilirubin Urine: NEGATIVE
Glucose, UA: NEGATIVE mg/dL
Hgb urine dipstick: NEGATIVE
Ketones, ur: NEGATIVE mg/dL
Leukocytes,Ua: NEGATIVE
Nitrite: NEGATIVE
Protein, ur: 30 mg/dL — AB
Specific Gravity, Urine: 1.023 (ref 1.005–1.030)
pH: 5 (ref 5.0–8.0)

## 2021-10-06 LAB — CBC WITH DIFFERENTIAL/PLATELET
Abs Immature Granulocytes: 0.07 10*3/uL (ref 0.00–0.07)
Basophils Absolute: 0 10*3/uL (ref 0.0–0.1)
Basophils Relative: 0 %
Eosinophils Absolute: 0.1 10*3/uL (ref 0.0–0.5)
Eosinophils Relative: 1 %
HCT: 33.9 % — ABNORMAL LOW (ref 39.0–52.0)
Hemoglobin: 10.7 g/dL — ABNORMAL LOW (ref 13.0–17.0)
Immature Granulocytes: 1 %
Lymphocytes Relative: 12 %
Lymphs Abs: 0.6 10*3/uL — ABNORMAL LOW (ref 0.7–4.0)
MCH: 28.7 pg (ref 26.0–34.0)
MCHC: 31.6 g/dL (ref 30.0–36.0)
MCV: 90.9 fL (ref 80.0–100.0)
Monocytes Absolute: 0.4 10*3/uL (ref 0.1–1.0)
Monocytes Relative: 7 %
Neutro Abs: 3.9 10*3/uL (ref 1.7–7.7)
Neutrophils Relative %: 79 %
Platelets: 295 10*3/uL (ref 150–400)
RBC: 3.73 MIL/uL — ABNORMAL LOW (ref 4.22–5.81)
RDW: 18.7 % — ABNORMAL HIGH (ref 11.5–15.5)
WBC: 5 10*3/uL (ref 4.0–10.5)
nRBC: 0 % (ref 0.0–0.2)

## 2021-10-06 LAB — BRAIN NATRIURETIC PEPTIDE: B Natriuretic Peptide: 32.5 pg/mL (ref 0.0–100.0)

## 2021-10-06 LAB — HEPATITIS PANEL, ACUTE
HCV Ab: NONREACTIVE
Hep A IgM: NONREACTIVE
Hep B C IgM: NONREACTIVE
Hepatitis B Surface Ag: NONREACTIVE

## 2021-10-06 LAB — STREP PNEUMONIAE URINARY ANTIGEN: Strep Pneumo Urinary Antigen: NEGATIVE

## 2021-10-06 LAB — LACTIC ACID, PLASMA: Lactic Acid, Venous: 1.2 mmol/L (ref 0.5–1.9)

## 2021-10-06 MED ORDER — IPRATROPIUM-ALBUTEROL 0.5-2.5 (3) MG/3ML IN SOLN: 3.0000 mL | Freq: Three times a day (TID) | RESPIRATORY_TRACT | Status: DC

## 2021-10-06 MED ORDER — ACETAMINOPHEN 325 MG PO TABS: 650.0000 mg | ORAL_TABLET | Freq: Four times a day (QID) | ORAL | Status: DC | PRN

## 2021-10-06 MED ORDER — OXYCODONE HCL 5 MG PO TABS
5.0000 mg | ORAL_TABLET | Freq: Four times a day (QID) | ORAL | Status: DC | PRN
Start: 2021-10-06 — End: 2021-10-07
  Administered 2021-10-06 (×2): 5 mg via ORAL
  Filled 2021-10-06 (×2): qty 1

## 2021-10-06 MED ORDER — ASPIRIN 81 MG PO CHEW
81.0000 mg | CHEWABLE_TABLET | Freq: Every day | ORAL | Status: DC
  Administered 2021-10-06 – 2021-10-07 (×2): 81 mg via ORAL
  Filled 2021-10-06 (×2): qty 1

## 2021-10-06 MED ORDER — PANTOPRAZOLE SODIUM 40 MG PO TBEC
40.0000 mg | DELAYED_RELEASE_TABLET | Freq: Every day | ORAL | Status: DC
  Administered 2021-10-06 – 2021-10-07 (×2): 40 mg via ORAL
  Filled 2021-10-06 (×2): qty 1

## 2021-10-06 MED ORDER — IPRATROPIUM-ALBUTEROL 0.5-2.5 (3) MG/3ML IN SOLN
3.0000 mL | Freq: Four times a day (QID) | RESPIRATORY_TRACT | Status: DC
  Administered 2021-10-06: 3 mL via RESPIRATORY_TRACT
  Filled 2021-10-06: qty 3

## 2021-10-06 MED ORDER — ETHAMBUTOL HCL 400 MG PO TABS
800.0000 mg | ORAL_TABLET | Freq: Every day | ORAL | Status: DC
  Administered 2021-10-06 – 2021-10-07 (×2): 800 mg via ORAL
  Filled 2021-10-06 (×2): qty 2

## 2021-10-06 MED ORDER — METHYLPREDNISOLONE SODIUM SUCC 125 MG IJ SOLR
125.0000 mg | Freq: Once | INTRAMUSCULAR | Status: AC
  Administered 2021-10-06: 125 mg via INTRAVENOUS
  Filled 2021-10-06: qty 2

## 2021-10-06 MED ORDER — ACETAMINOPHEN 650 MG RE SUPP: 650.0000 mg | Freq: Four times a day (QID) | RECTAL | Status: DC | PRN

## 2021-10-06 MED ORDER — IPRATROPIUM BROMIDE 0.02 % IN SOLN
0.5000 mg | Freq: Once | RESPIRATORY_TRACT | Status: AC
Start: 2021-10-06 — End: 2021-10-06
  Administered 2021-10-06: 0.5 mg via RESPIRATORY_TRACT
  Filled 2021-10-06: qty 2.5

## 2021-10-06 MED ORDER — SODIUM CHLORIDE 0.9 % IV SOLN: INTRAVENOUS | Status: DC

## 2021-10-06 MED ORDER — BICTEGRAVIR-EMTRICITAB-TENOFOV 50-200-25 MG PO TABS
1.0000 | ORAL_TABLET | Freq: Every morning | ORAL | Status: DC
  Administered 2021-10-06 – 2021-10-07 (×2): 1 via ORAL
  Filled 2021-10-06 (×2): qty 1

## 2021-10-06 MED ORDER — ALBUTEROL SULFATE (2.5 MG/3ML) 0.083% IN NEBU
5.0000 mg | INHALATION_SOLUTION | Freq: Once | RESPIRATORY_TRACT | Status: DC
  Filled 2021-10-06: qty 6

## 2021-10-06 MED ORDER — ONDANSETRON HCL 4 MG/2ML IJ SOLN: 4.0000 mg | Freq: Four times a day (QID) | INTRAMUSCULAR | Status: DC | PRN

## 2021-10-06 MED ORDER — GUAIFENESIN ER 600 MG PO TB12
600.0000 mg | ORAL_TABLET | Freq: Two times a day (BID) | ORAL | Status: DC
  Administered 2021-10-06 – 2021-10-07 (×3): 600 mg via ORAL
  Filled 2021-10-06 (×3): qty 1

## 2021-10-06 MED ORDER — IOHEXOL 350 MG/ML SOLN
100.0000 mL | Freq: Once | INTRAVENOUS | Status: AC | PRN
  Administered 2021-10-06: 100 mL via INTRAVENOUS

## 2021-10-06 MED ORDER — ENOXAPARIN SODIUM 40 MG/0.4ML IJ SOSY
40.0000 mg | PREFILLED_SYRINGE | INTRAMUSCULAR | Status: DC
  Administered 2021-10-06: 40 mg via SUBCUTANEOUS
  Filled 2021-10-06: qty 0.4

## 2021-10-06 MED ORDER — ALBUTEROL SULFATE (2.5 MG/3ML) 0.083% IN NEBU: 5.0000 mg | INHALATION_SOLUTION | Freq: Once | RESPIRATORY_TRACT | Status: DC

## 2021-10-06 MED ORDER — SODIUM CHLORIDE (PF) 0.9 % IJ SOLN
INTRAMUSCULAR | Status: AC
Start: 2021-10-06 — End: 2021-10-06
  Filled 2021-10-06: qty 50

## 2021-10-06 MED ORDER — ONDANSETRON HCL 4 MG PO TABS: 4.0000 mg | ORAL_TABLET | Freq: Four times a day (QID) | ORAL | Status: DC | PRN

## 2021-10-06 MED ORDER — MEGESTROL ACETATE 400 MG/10ML PO SUSP
400.0000 mg | Freq: Every morning | ORAL | Status: DC
  Administered 2021-10-07: 400 mg via ORAL
  Filled 2021-10-06: qty 10

## 2021-10-06 MED ORDER — AZITHROMYCIN 250 MG PO TABS
500.0000 mg | ORAL_TABLET | Freq: Every day | ORAL | Status: DC
  Administered 2021-10-06: 500 mg via ORAL
  Filled 2021-10-06: qty 2

## 2021-10-06 MED ORDER — ACETAMINOPHEN 500 MG PO TABS
1000.0000 mg | ORAL_TABLET | Freq: Once | ORAL | Status: AC
  Administered 2021-10-06: 1000 mg via ORAL
  Filled 2021-10-06: qty 2

## 2021-10-06 MED ORDER — FLUCONAZOLE 100 MG PO TABS
200.0000 mg | ORAL_TABLET | Freq: Every day | ORAL | Status: DC
  Administered 2021-10-06 – 2021-10-07 (×2): 200 mg via ORAL
  Filled 2021-10-06 (×2): qty 2

## 2021-10-06 MED ORDER — ALBUTEROL SULFATE (2.5 MG/3ML) 0.083% IN NEBU
5.0000 mg | INHALATION_SOLUTION | Freq: Once | RESPIRATORY_TRACT | Status: AC
Start: 2021-10-06 — End: 2021-10-06
  Administered 2021-10-06: 5 mg via RESPIRATORY_TRACT
  Filled 2021-10-06: qty 6

## 2021-10-06 MED ORDER — MIRTAZAPINE 7.5 MG PO TABS
15.0000 mg | ORAL_TABLET | Freq: Every day | ORAL | Status: DC
Start: 2021-10-06 — End: 2021-10-07
  Administered 2021-10-06: 15 mg via ORAL
  Filled 2021-10-06: qty 2

## 2021-10-06 MED ORDER — SODIUM CHLORIDE 0.9 % IV SOLN
1.0000 g | INTRAVENOUS | Status: DC
  Administered 2021-10-06: 1 g via INTRAVENOUS
  Filled 2021-10-06 (×2): qty 10

## 2021-10-06 MED ORDER — REVEFENACIN 175 MCG/3ML IN SOLN
175.0000 ug | Freq: Every day | RESPIRATORY_TRACT | Status: DC
  Administered 2021-10-06 – 2021-10-07 (×2): 175 ug via RESPIRATORY_TRACT
  Filled 2021-10-06 (×2): qty 3

## 2021-10-06 MED ORDER — LORATADINE 10 MG PO TABS
10.0000 mg | ORAL_TABLET | Freq: Every morning | ORAL | Status: DC
Start: 2021-10-07 — End: 2021-10-07
  Administered 2021-10-07: 10 mg via ORAL
  Filled 2021-10-06: qty 1

## 2021-10-06 MED ORDER — SULFAMETHOXAZOLE-TRIMETHOPRIM 400-80 MG/5ML IV SOLN
320.0000 mg | Freq: Once | INTRAVENOUS | Status: AC
  Administered 2021-10-06: 320 mg via INTRAVENOUS
  Filled 2021-10-06: qty 20

## 2021-10-06 MED ORDER — QUETIAPINE FUMARATE 100 MG PO TABS
200.0000 mg | ORAL_TABLET | Freq: Every day | ORAL | Status: DC
  Administered 2021-10-06: 200 mg via ORAL
  Filled 2021-10-06: qty 2

## 2021-10-06 MED ORDER — ARFORMOTEROL TARTRATE 15 MCG/2ML IN NEBU
15.0000 ug | INHALATION_SOLUTION | Freq: Two times a day (BID) | RESPIRATORY_TRACT | Status: DC
  Administered 2021-10-06 – 2021-10-07 (×2): 15 ug via RESPIRATORY_TRACT
  Filled 2021-10-06 (×2): qty 2

## 2021-10-06 MED ORDER — IBUPROFEN 200 MG PO TABS: 400.0000 mg | ORAL_TABLET | Freq: Four times a day (QID) | ORAL | Status: DC | PRN

## 2021-10-06 NOTE — ED Triage Notes (Signed)
Pt BIB EMS with reports of SHOB and respiratory issues x 1 week. ?  ?Given by EMS ?2.5 Albuterol ?0.5 Atrovent ?125 mg solumedrol ?22 g left hand  ?

## 2021-10-06 NOTE — ED Provider Notes (Addendum)
[facility]-EMERGENCY DEPT Provider Note   CSN: [id number] Arrival date & time: 10/06/21  0230     History  Chief Complaint  Patient presents with   Shortness of Breath    Kurt Foley is a 46 y.o. male.  Pt with hx hiv/aids, with increased non prod cough, low grade fever, and sob in the past week. Symptoms acute onset, moderate, persistent, worse in past day. No sore throat or runny nose. No specific known ill contacts. States compliant w most of his meds, but unsure which meds he is not taking. Denies chest pain. Notes some abd discomfort and mild distension. No vomiting/diarrhea. No dysuria. No extremity pain or swelling.   The history is provided by the patient, medical records and the EMS personnel.  Shortness of Breath Associated symptoms: abdominal pain, cough and fever   Associated symptoms: no chest pain, no headaches, no neck pain, no rash, no sore throat and no vomiting       Home Medications Prior to Admission medications   Medication Sig Start Date End Date Taking? Authorizing Provider  acetaminophen (TYLENOL) 325 MG tablet Take 1-2 tablets (325-650 mg total) by mouth every 4 (four) hours as needed for mild pain. 08/05/21   Love, Evlyn Kanner, PA-C  aspirin 81 MG chewable tablet Chew 1 tablet (81 mg total) by mouth daily. 08/05/21   Love, Evlyn Kanner, PA-C  azithromycin (ZITHROMAX) 500 MG tablet Take 1 tablet (500 mg total) by mouth at bedtime. 08/05/21   Love, Evlyn Kanner, PA-C  bictegravir-emtricitabine-tenofovir AF (BIKTARVY) 50-200-25 MG TABS tablet Take 1 tablet by mouth daily. 08/05/21   Love, Evlyn Kanner, PA-C  diclofenac Sodium (VOLTAREN) 1 % GEL Apply 2 g topically 4 (four) times daily. 08/05/21   Love, Evlyn Kanner, PA-C  ethambutol (MYAMBUTOL) 400 MG tablet Take 2 tablets (800 mg total) by mouth daily. 08/05/21   Love, Evlyn Kanner, PA-C  fluticasone furoate-vilanterol (BREO ELLIPTA) 100-25 MCG/ACT AEPB Inhale 1 puff into the lungs daily. 08/05/21   Love,  Evlyn Kanner, PA-C  loratadine (CLARITIN) 10 MG tablet Take 1 tablet (10 mg total) by mouth daily. 09/14/21   [name], MD  megestrol (MEGACE) 400 MG/10ML suspension Take 10 mLs (400 mg total) by mouth daily. 08/06/21   Love, Evlyn Kanner, PA-C  metoprolol succinate (TOPROL-XL) 50 MG 24 hr tablet Take 1 tablet (50 mg total) by mouth daily. Take with or immediately following a meal. NEEDS PASS 08/17/21   Claiborne Rigg, NP  mirtazapine (REMERON) 15 MG tablet Take 1 tablet (15 mg total) by mouth at bedtime. 08/05/21   Love, Evlyn Kanner, PA-C  Multiple Vitamins-Minerals (CERTAVITE/ANTIOXIDANTS) TABS Take 1 tablet by mouth daily. 08/05/21   Love, Evlyn Kanner, PA-C  Oxycodone HCl 10 MG TABS Take 0.5-1 tablets (5-10 mg total) by mouth every 6 (six) hours as needed for severe pain. 08/05/21   Love, Evlyn Kanner, PA-C  pantoprazole (PROTONIX) 40 MG tablet Take 1 tablet (40 mg total) by mouth daily. 08/05/21   Love, Evlyn Kanner, PA-C  polyethylene glycol (MIRALAX / GLYCOLAX) 17 g packet Take 17 g by mouth daily as needed for mild constipation or moderate constipation. 07/20/21   Pokhrel, Rebekah Chesterfield, MD  QUEtiapine (SEROQUEL) 200 MG tablet Take 1 tablet (200 mg total) by mouth at bedtime. 08/05/21   Love, Evlyn Kanner, PA-C  rosuvastatin (CRESTOR) 5 MG tablet Take 1 tablet (5 mg total) by mouth daily. 08/17/21   Claiborne Rigg, NP  senna-docusate (SENOKOT-S) 8.6-50  MG tablet Take 1 tablet by mouth 2 (two) times daily. 08/05/21   Love, Evlyn Kanner, PA-C  sulfamethoxazole-trimethoprim (BACTRIM DS) 800-160 MG tablet Take 1 tablet by mouth daily. 08/05/21   Love, Evlyn Kanner, PA-C  umeclidinium bromide (INCRUSE ELLIPTA) 62.5 MCG/ACT AEPB Inhale 1 puff into the lungs daily. 08/05/21   Love, Evlyn Kanner, PA-C      Allergies    Lidocaine    Review of Systems   Review of Systems  Constitutional:  Positive for fever.  HENT:  Negative for sore throat.   Eyes:  Negative for redness.  Respiratory:  Positive for cough and shortness of breath.    Cardiovascular:  Negative for chest pain and leg swelling.  Gastrointestinal:  Positive for abdominal pain. Negative for diarrhea and vomiting.  Genitourinary:  Negative for dysuria and flank pain.  Musculoskeletal:  Negative for back pain and neck pain.  Skin:  Negative for rash.  Neurological:  Negative for headaches.  Hematological:  Does not bruise/bleed easily.  Psychiatric/Behavioral:  Negative for confusion.    Physical Exam Updated Vital Signs Pulse (!) 135   Temp 100 F (37.8 C) (Oral)   Resp (!) 42   Ht 1.651 m (5\' 5" )   Wt 63.5 kg   SpO2 96%   BMI 23.30 kg/m  Physical Exam Vitals and nursing note reviewed.  Constitutional:      Appearance: He is well-developed.     Comments: Low grade fever, coughing, tachycardic.   HENT:     Head: Atraumatic.     Nose: Nose normal.     Mouth/Throat:     Mouth: Mucous membranes are moist.     Pharynx: Oropharynx is clear.  Eyes:     General: No scleral icterus.    Conjunctiva/sclera: Conjunctivae normal.     Pupils: Pupils are equal, round, and reactive to light.  Neck:     Trachea: No tracheal deviation.     Comments: No stiffness or rigidity.  Cardiovascular:     Rate and Rhythm: Regular rhythm. Tachycardia present.     Pulses: Normal pulses.     Heart sounds: Normal heart sounds. No murmur heard.   No friction rub. No gallop.  Pulmonary:     Effort: Pulmonary effort is normal. No accessory muscle usage or respiratory distress.     Breath sounds: Wheezing and rhonchi present.     Comments: coughing Abdominal:     General: Bowel sounds are normal. There is distension.     Palpations: Abdomen is soft.     Tenderness: There is abdominal tenderness.     Comments: Mild distension and mild mid abd tenderness.   Genitourinary:    Comments: No cva tenderness. Musculoskeletal:        General: No swelling or tenderness.     Cervical back: Normal range of motion and neck supple. No rigidity.     Right lower leg: No edema.      Left lower leg: No edema.  Skin:    General: Skin is warm and dry.     Findings: No rash.  Neurological:     Mental Status: He is alert.     Comments: Alert, speech clear.   Psychiatric:        Mood and Affect: Mood normal.    ED Results / Procedures / Treatments   Labs (all labs ordered are listed, but only abnormal results are displayed) Results for orders placed or performed during the hospital encounter of 10/06/21  Resp  Panel by RT-PCR (Flu A&B, Covid) Nasopharyngeal Swab   Specimen: Nasopharyngeal Swab; Nasopharyngeal(NP) swabs in vial transport medium  Result Value Ref Range   SARS Coronavirus 2 by RT PCR NEGATIVE NEGATIVE   Influenza A by PCR NEGATIVE NEGATIVE   Influenza B by PCR NEGATIVE NEGATIVE  CBC with Differential  Result Value Ref Range   WBC 5.0 4.0 - 10.5 K/uL   RBC 3.73 (L) 4.22 - 5.81 MIL/uL   Hemoglobin 10.7 (L) 13.0 - 17.0 g/dL   HCT 40.3 (L) 47.4 - 25.9 %   MCV 90.9 80.0 - 100.0 fL   MCH 28.7 26.0 - 34.0 pg   MCHC 31.6 30.0 - 36.0 g/dL   RDW 56.3 (H) 87.5 - 64.3 %   Platelets 295 150 - 400 K/uL   nRBC 0.0 0.0 - 0.2 %   Neutrophils Relative % 79 %   Neutro Abs 3.9 1.7 - 7.7 K/uL   Lymphocytes Relative 12 %   Lymphs Abs 0.6 (L) 0.7 - 4.0 K/uL   Monocytes Relative 7 %   Monocytes Absolute 0.4 0.1 - 1.0 K/uL   Eosinophils Relative 1 %   Eosinophils Absolute 0.1 0.0 - 0.5 K/uL   Basophils Relative 0 %   Basophils Absolute 0.0 0.0 - 0.1 K/uL   Immature Granulocytes 1 %   Abs Immature Granulocytes 0.07 0.00 - 0.07 K/uL  Comprehensive metabolic panel  Result Value Ref Range   Sodium 135 135 - 145 mmol/L   Potassium 4.6 3.5 - 5.1 mmol/L   Chloride 110 98 - 111 mmol/L   CO2 16 (L) 22 - 32 mmol/L   Glucose, Bld 127 (H) 70 - 99 mg/dL   BUN 21 (H) 6 - 20 mg/dL   Creatinine, Ser 3.29 0.61 - 1.24 mg/dL   Calcium 9.5 8.9 - 51.8 mg/dL   Total Protein 8.5 (H) 6.5 - 8.1 g/dL   Albumin 3.4 (L) 3.5 - 5.0 g/dL   AST 98 (H) 15 - 41 U/L   ALT 132 (H) 0 -  44 U/L   Alkaline Phosphatase 294 (H) 38 - 126 U/L   Total Bilirubin 0.8 0.3 - 1.2 mg/dL   GFR, Estimated >84 >16 mL/min   Anion gap 9 5 - 15  Lactic acid, plasma  Result Value Ref Range   Lactic Acid, Venous 1.2 0.5 - 1.9 mmol/L  Urinalysis, Routine w reflex microscopic  Result Value Ref Range   Color, Urine YELLOW YELLOW   APPearance CLEAR CLEAR   Specific Gravity, Urine 1.023 1.005 - 1.030   pH 5.0 5.0 - 8.0   Glucose, UA NEGATIVE NEGATIVE mg/dL   Hgb urine dipstick NEGATIVE NEGATIVE   Bilirubin Urine NEGATIVE NEGATIVE   Ketones, ur NEGATIVE NEGATIVE mg/dL   Protein, ur 30 (A) NEGATIVE mg/dL   Nitrite NEGATIVE NEGATIVE   Leukocytes,Ua NEGATIVE NEGATIVE   RBC / HPF 0-5 0 - 5 RBC/hpf   WBC, UA 0-5 0 - 5 WBC/hpf   Bacteria, UA NONE SEEN NONE SEEN   Mucus PRESENT   Rapid urine drug screen (hospital performed)  Result Value Ref Range   Opiates NONE DETECTED NONE DETECTED   Cocaine NONE DETECTED NONE DETECTED   Benzodiazepines NONE DETECTED NONE DETECTED   Amphetamines NONE DETECTED NONE DETECTED   Tetrahydrocannabinol NONE DETECTED NONE DETECTED   Barbiturates NONE DETECTED NONE DETECTED   DG Chest 2 View  Result Date: 09/15/2021 CLINICAL DATA:  Chronic cough. EXAM: CHEST - 2 VIEW COMPARISON:  Portable chest 07/15/2021, chest  CT with contrast 06/28/2021. FINDINGS: The cardiac size is normal. There is a stable mediastinal configuration with mild aortic atherosclerosis. The lungs are emphysematous with again noted pleural-parenchymal scar-like opacities in the lateral right upper lobe and lateral left base. No new or acute lung infiltrate is seen. The remaining lungs are generally clear. The sulci are sharp. There is a slight thoracic kypholevoscoliosis. IMPRESSION: Stable pleuroparenchymal scar-like opacities in the right upper and left lower lobes. Stable COPD chest. No new abnormality. Aortic atherosclerosis. Electronically Signed   By: Almira Bar M.D.   On: 09/15/2021 20:59    DG Chest Portable 1 View  Result Date: 10/06/2021 CLINICAL DATA:  Shortness of breath EXAM: PORTABLE CHEST 1 VIEW COMPARISON:  Chest radiograph dated 09/14/2021. CT chest dated 06/28/2021. FINDINGS: Subpleural scarring in the right upper lobe, unchanged. Linear scarring/atelectasis in the lingula/left lower lobe. No focal consolidation. No pleural effusion or pneumothorax. The heart is normal in size. IMPRESSION: No evidence of acute cardiopulmonary disease. Electronically Signed   By: Charline Bills M.D.   On: 10/06/2021 02:57     EKG EKG Interpretation  Date/Time:  Thursday October 06 2021 06:20:19 EDT Ventricular Rate:  101 PR Interval:  132 QRS Duration: 79 QT Interval:  350 QTC Calculation: 454 R Axis:   67 Text Interpretation: Sinus tachycardia Baseline wander Confirmed by Cathren Laine ([id number]) on 10/06/2021 6:32:52 AM  Radiology DG Chest Portable 1 View  Result Date: 10/06/2021 CLINICAL DATA:  Shortness of breath EXAM: PORTABLE CHEST 1 VIEW COMPARISON:  Chest radiograph dated 09/14/2021. CT chest dated 06/28/2021. FINDINGS: Subpleural scarring in the right upper lobe, unchanged. Linear scarring/atelectasis in the lingula/left lower lobe. No focal consolidation. No pleural effusion or pneumothorax. The heart is normal in size. IMPRESSION: No evidence of acute cardiopulmonary disease. Electronically Signed   By: Charline Bills M.D.   On: 10/06/2021 02:57    Procedures Procedures    Medications Ordered in ED Medications - No data to display  ED Course/ Medical Decision Making/ A&P                           Medical Decision Making Problems Addressed: Acute respiratory infection: acute illness or injury with systemic symptoms that poses a threat to life or bodily functions AIDS (acquired immune deficiency syndrome) (HCC): chronic illness or injury with exacerbation, progression, or side effects of treatment that poses a threat to life or bodily functions Non-productive  cough: acute illness or injury Shortness of breath: acute illness or injury with systemic symptoms that poses a threat to life or bodily functions Symptomatic HIV infection (HCC): chronic illness or injury with exacerbation, progression, or side effects of treatment that poses a threat to life or bodily functions Wheezing: acute illness or injury  Amount and/or Complexity of Data Reviewed Independent Historian: EMS    Details: hx External Data Reviewed: labs and notes. Labs: ordered. Decision-making details documented in ED Course. Radiology: ordered and independent interpretation performed. Decision-making details documented in ED Course. ECG/medicine tests: ordered and independent interpretation performed. Decision-making details documented in ED Course. Discussion of management or test interpretation with external provider(s): Hospitalists, discussed pt, labs, imaging.  Risk OTC drugs. Prescription drug management. Decision regarding hospitalization.   Iv ns. Continuous pulse ox and cardiac monitoring. Labs ordered/sent. Imaging ordered.   Reviewed nursing notes and prior charts for additional history. External reports reviewed. Additional history from: EMS.  Lactate and cultures sent.   Cardiac monitor: sinus rhythm, rate  124.  Labs reviewed/interpreted by me - wbc normal. K normal. Covid neg.   Xrays reviewed/interpreted by me - no def pna.   Solumedrol iv. Albuterol and atrovent tx.   Bactrim iv.   Given tachycardia, sob, no signif infil on cxr - will get cta r/o PE, and also abd imaging.   CT reviewed/interpreted by me - no pe. No acute abd process.  Given tachycardia, cough, sob, hiv/aids - will consult hospitalists for admission.           Final Clinical Impression(s) / ED Diagnoses Final diagnoses:  None    Rx / DC Orders ED Discharge Orders     None           Cathren Laine, MD 10/06/21 [phone]

## 2021-10-06 NOTE — Consult Note (Signed)
? ?NAME:  Kurt Foley, MRN:  [id number], DOB:  May 23, 1976, LOS: 0 ?ADMISSION DATE:  10/06/2021, CONSULTATION DATE:  10/06/2021 ?REFERRING MD:  Dr. Marylyn Ishihara, CHIEF COMPLAINT:  dyspnea with cough  ? ?History of Present Illness:  ?Kurt Foley is a 46yo male with a PMH significant for HIV/AIDS, PML, disseminated MAC, prior CVA with right residual weakness, and depression who presented to [facility] ED 4/27 for complaints of dyspnea. Patient states that dyspnea started one week prior to admission and is associated with dry cough and subjective fever.  ? ?On ED arrival patient was seen  hemodynamically stable with mild tachycardia and tachypnea.  Lab work was significant for CO2 16, mildly elevated LFTs, and hgb 10.7. CTA chest was obtained and was negative for acute PE but revealed progressive mass-like peripheral RUL opacity concerning for unresolved pneumonia or malignancy. Given CT images pulmonary was consulted for assistance in management.  ? ?Pertinent  Medical History  ?HIV/AIDS, PML, disseminated MAC, prior CVA with right residual weakness, and depression  ? ?Significant Hospital Events: ?Including procedures, antibiotic start and stop dates in addition to other pertinent events   ?4/27 Presented with persistent dyspnea, CT concerning for RUL unresolved pneumonia vs malignancy   ? ?Interim History / Subjective:  ?Seen sitting up on side of bed eating lunch, denies any acute complaints  ? ?Objective   ?Blood pressure (!) 115/93, pulse 97, temperature 97.7 ?F (36.5 ?C), temperature source Oral, resp. rate (!) 25, height _0  (1.651 m), weight 63.5 kg, SpO2 96 %. ?   ?   ?No intake or output data in the 24 hours ending 10/06/21 1004 ?Filed Weights  ? 10/06/21 0245  ?Weight: 63.5 kg  ? ? ?Examination: ?General: Acute on chronic ill appearing thin adult male sitting up on edge of bed, in NAD ?HEENT: [place]/AT, MM pink/moist, PERRL,  ?Neuro: Alert and oriented x3, non-focal  ?CV: s1s2 regular rate and rhythm, no murmur,  rubs, or gallops,  ?PULM:  Clear to ascultation, no increased work of breathing, no added breath sounds, on room air ?GI: soft, bowel sounds active in all 4 quadrants, non-tender, non-distended, tolerating oral diet ?Extremities: warm/dry, no edema  ?Skin: no rashes or lesions ? ?Resolved Hospital Problem list   ? ? ?Assessment & Plan:  ?Right upper lobe opacity  ?-CTA chest was obtained and was negative for acute PE but revealed progressive mass-like peripheral RUL opacity concerning for unresolved pneumonia or malignancy. ?Hx of HIV/AIDS  ?Hx of prior tobacco use  ?Disseminated MAC ?P: ?Respiratory culture  ?Empiric azithromycin and ceftriaxone  ?Mobilize as able  ?Ensure pain control  ?May need to consider bronch for biopsy ? ?Best Practice (right click and "Reselect all SmartList Selections" daily)  ? ?Per primary  ? ?Labs   ?CBC: ?Recent Labs  ?Lab 10/06/21 ?0320  ?WBC 5.0  ?NEUTROABS 3.9  ?HGB 10.7*  ?HCT 33.9*  ?MCV 90.9  ?PLT 295  ? ? ?Basic Metabolic Panel: ?Recent Labs  ?Lab 10/06/21 ?0320  ?NA 135  ?K 4.6  ?CL 110  ?CO2 16*  ?GLUCOSE 127*  ?BUN 21*  ?CREATININE 0.97  ?CALCIUM 9.5  ? ?GFR: ?Estimated Creatinine Clearance: 81.9 mL/min (by C-G formula based on SCr of 0.97 mg/dL). ?Recent Labs  ?Lab 10/06/21 ?0320  ?WBC 5.0  ?LATICACIDVEN 1.2  ? ? ?Liver Function Tests: ?Recent Labs  ?Lab 10/06/21 ?0320  ?AST 98*  ?ALT 132*  ?ALKPHOS 294*  ?BILITOT 0.8  ?PROT 8.5*  ?ALBUMIN 3.4*  ? ?No  results for input(s): LIPASE, AMYLASE in the last 168 hours. ?No results for input(s): AMMONIA in the last 168 hours. ? ?ABG ?No results found for: PHART, PCO2ART, PO2ART, HCO3, TCO2, ACIDBASEDEF, O2SAT  ? ?Coagulation Profile: ?No results for input(s): INR, PROTIME in the last 168 hours. ? ?Cardiac Enzymes: ?No results for input(s): CKTOTAL, CKMB, CKMBINDEX, TROPONINI in the last 168 hours. ? ?HbA1C: ?No results found for: HGBA1C ? ?CBG: ?No results for input(s): GLUCAP in the last 168 hours. ? ?Review of Systems:   ?Please  see the history of present illness. All other systems reviewed and are negative  ? ?Past Medical History:  ?He,  has a past medical history of Depression and HIV infection ([place]).  ? ?Surgical History:  ?History reviewed. No pertinent surgical history.  ? ?Social History:  ? reports that he has quit smoking. His smoking use included cigarettes. He has a 12.50 pack-year smoking history. He has never used smokeless tobacco. He reports that he does not currently use alcohol after a past usage of about 2.0 standard drinks per week. He reports that he does not currently use drugs after having used the following drugs: Marijuana and Cocaine.  ? ?Family History:  ?His family history includes Arthritis in his mother; Diabetes in his mother; Hypertension in his mother; Rheum arthritis in his mother.  ? ?Allergies ?Allergies  ?Allergen Reactions  ? Lidocaine Other (See Comments)  ?  Large bruise at site of patch placement  ?  ? ?Home Medications  ?Prior to Admission medications   ?Medication Sig Start Date End Date Taking? Authorizing Provider  ?bictegravir-emtricitabine-tenofovir AF (BIKTARVY) 50-200-25 MG TABS tablet Take 1 tablet by mouth daily. ?Patient taking differently: Take 1 tablet by mouth every morning. 08/05/21  Yes Love, Ivan Anchors, PA-C  ?ethambutol (MYAMBUTOL) 400 MG tablet Take 2 tablets (800 mg total) by mouth daily. ?Patient taking differently: Take 800 mg by mouth every morning. 08/05/21  Yes Love, Ivan Anchors, PA-C  ?loratadine (CLARITIN) 10 MG tablet Take 1 tablet (10 mg total) by mouth daily. ?Patient taking differently: Take 10 mg by mouth every morning. 09/14/21  Yes Comer, Okey Regal, MD  ?metoprolol succinate (TOPROL-XL) 50 MG 24 hr tablet Take 1 tablet (50 mg total) by mouth daily. Take with or immediately following a meal. NEEDS PASS ?Patient taking differently: Take 50 mg by mouth every morning. Take with or immediately following a meal. NEEDS PASS 08/17/21  Yes Gildardo Pounds, NP  ?QUEtiapine (SEROQUEL) 200  MG tablet Take 1 tablet (200 mg total) by mouth at bedtime. 08/05/21  Yes Love, Ivan Anchors, PA-C  ?rosuvastatin (CRESTOR) 5 MG tablet Take 1 tablet (5 mg total) by mouth daily. ?Patient taking differently: Take 5 mg by mouth every morning. 08/17/21  Yes Gildardo Pounds, NP  ?sulfamethoxazole-trimethoprim (BACTRIM DS) 800-160 MG tablet Take 1 tablet by mouth daily. ?Patient taking differently: Take 1 tablet by mouth every morning. 08/05/21  Yes Love, Ivan Anchors, PA-C  ?acetaminophen (TYLENOL) 325 MG tablet Take 1-2 tablets (325-650 mg total) by mouth every 4 (four) hours as needed for mild pain. 08/05/21   Bary Leriche, PA-C  ?aspirin 81 MG chewable tablet Chew 1 tablet (81 mg total) by mouth daily. 08/05/21   Love, Ivan Anchors, PA-C  ?azithromycin (ZITHROMAX) 500 MG tablet Take 1 tablet (500 mg total) by mouth at bedtime. 08/05/21   Love, Ivan Anchors, PA-C  ?diclofenac Sodium (VOLTAREN) 1 % GEL Apply 2 g topically 4 (four) times daily. 08/05/21  Love, Pamela S, PA-C  ?fluticasone furoate-vilanterol (BREO ELLIPTA) 100-25 MCG/ACT AEPB Inhale 1 puff into the lungs daily. 08/05/21   Love, Ivan Anchors, PA-C  ?megestrol (MEGACE) 400 MG/10ML suspension Take 10 mLs (400 mg total) by mouth daily. 08/06/21   Love, Ivan Anchors, PA-C  ?mirtazapine (REMERON) 15 MG tablet Take 1 tablet (15 mg total) by mouth at bedtime. 08/05/21   Bary Leriche, PA-C  ?Multiple Vitamins-Minerals (CERTAVITE/ANTIOXIDANTS) TABS Take 1 tablet by mouth daily. 08/05/21   Bary Leriche, PA-C  ?Oxycodone HCl 10 MG TABS Take 0.5-1 tablets (5-10 mg total) by mouth every 6 (six) hours as needed for severe pain. 08/05/21   Love, Ivan Anchors, PA-C  ?pantoprazole (PROTONIX) 40 MG tablet Take 1 tablet (40 mg total) by mouth daily. 08/05/21   Love, Ivan Anchors, PA-C  ?polyethylene glycol (MIRALAX / GLYCOLAX) 17 g packet Take 17 g by mouth daily as needed for mild constipation or moderate constipation. 07/20/21   Pokhrel, Corrie Mckusick, MD  ?senna-docusate (SENOKOT-S) 8.6-50 MG tablet Take 1 tablet by  mouth 2 (two) times daily. 08/05/21   Love, Ivan Anchors, PA-C  ?umeclidinium bromide (INCRUSE ELLIPTA) 62.5 MCG/ACT AEPB Inhale 1 puff into the lungs daily. 08/05/21   Bary Leriche, PA-C  ?  ? ?Critical

## 2021-10-06 NOTE — H&P (Signed)
?History and Physical  ? ? ?Patient: Kurt Foley DDU:[id number] DOB: Aug 25, 1975 ?DOA: 10/06/2021 ?DOS: the patient was seen and examined on 10/06/2021 ?PCP: Gildardo Pounds, NP  ?Patient coming from: Home ? ?Chief Complaint:  ?Chief Complaint  ?Patient presents with  ? Shortness of Breath  ? ?HPI: Kurt Foley is a 46 y.o. male with medical history significant of HIV/AIDS, PML, Disseminated MAC, CVA w/ right residuals. Presenting with dyspnea. His symptoms started over a week ago. He says that he had a dry cough. He thinks he had fevers, but he is unable to report how high. He didn't try any medicines to help. He has felt progressively weaker. When his symptoms didn't resolve last night, he decided to come to the ED for help. He denies any other aggravating or alleviating factors.  ? ?Review of Systems: As mentioned in the history of present illness. All other systems reviewed and are negative. ?Past Medical History:  ?Diagnosis Date  ? Depression   ? HIV infection ([place])   ? ?History reviewed. No pertinent surgical history. ?Social History:  reports that he has quit smoking. His smoking use included cigarettes. He has a 12.50 pack-year smoking history. He has never used smokeless tobacco. He reports that he does not currently use alcohol after a past usage of about 2.0 standard drinks per week. He reports that he does not currently use drugs after having used the following drugs: Marijuana and Cocaine. ? ?Allergies  ?Allergen Reactions  ? Lidocaine Other (See Comments)  ?  Large bruise at site of patch placement  ? ? ?Family History  ?Problem Relation Age of Onset  ? Rheum arthritis Mother   ? Diabetes Mother   ? Hypertension Mother   ? Arthritis Mother   ? ? ?Prior to Admission medications   ?Medication Sig Start Date End Date Taking? Authorizing Provider  ?bictegravir-emtricitabine-tenofovir AF (BIKTARVY) 50-200-25 MG TABS tablet Take 1 tablet by mouth daily. ?Patient taking differently: Take 1 tablet by  mouth every morning. 08/05/21  Yes Love, Ivan Anchors, PA-C  ?ethambutol (MYAMBUTOL) 400 MG tablet Take 2 tablets (800 mg total) by mouth daily. ?Patient taking differently: Take 800 mg by mouth every morning. 08/05/21  Yes Love, Ivan Anchors, PA-C  ?loratadine (CLARITIN) 10 MG tablet Take 1 tablet (10 mg total) by mouth daily. ?Patient taking differently: Take 10 mg by mouth every morning. 09/14/21  Yes Comer, Okey Regal, MD  ?metoprolol succinate (TOPROL-XL) 50 MG 24 hr tablet Take 1 tablet (50 mg total) by mouth daily. Take with or immediately following a meal. NEEDS PASS ?Patient taking differently: Take 50 mg by mouth every morning. Take with or immediately following a meal. NEEDS PASS 08/17/21  Yes Gildardo Pounds, NP  ?QUEtiapine (SEROQUEL) 200 MG tablet Take 1 tablet (200 mg total) by mouth at bedtime. 08/05/21  Yes Love, Ivan Anchors, PA-C  ?rosuvastatin (CRESTOR) 5 MG tablet Take 1 tablet (5 mg total) by mouth daily. ?Patient taking differently: Take 5 mg by mouth every morning. 08/17/21  Yes Gildardo Pounds, NP  ?sulfamethoxazole-trimethoprim (BACTRIM DS) 800-160 MG tablet Take 1 tablet by mouth daily. ?Patient taking differently: Take 1 tablet by mouth every morning. 08/05/21  Yes Love, Ivan Anchors, PA-C  ?acetaminophen (TYLENOL) 325 MG tablet Take 1-2 tablets (325-650 mg total) by mouth every 4 (four) hours as needed for mild pain. 08/05/21   Bary Leriche, PA-C  ?aspirin 81 MG chewable tablet Chew 1 tablet (81 mg total) by mouth daily. 08/05/21  Love, Ivan Anchors, PA-C  ?azithromycin (ZITHROMAX) 500 MG tablet Take 1 tablet (500 mg total) by mouth at bedtime. 08/05/21   Love, Ivan Anchors, PA-C  ?diclofenac Sodium (VOLTAREN) 1 % GEL Apply 2 g topically 4 (four) times daily. 08/05/21   Love, Ivan Anchors, PA-C  ?fluticasone furoate-vilanterol (BREO ELLIPTA) 100-25 MCG/ACT AEPB Inhale 1 puff into the lungs daily. 08/05/21   Love, Ivan Anchors, PA-C  ?megestrol (MEGACE) 400 MG/10ML suspension Take 10 mLs (400 mg total) by mouth daily. 08/06/21    Love, Ivan Anchors, PA-C  ?mirtazapine (REMERON) 15 MG tablet Take 1 tablet (15 mg total) by mouth at bedtime. 08/05/21   Bary Leriche, PA-C  ?Multiple Vitamins-Minerals (CERTAVITE/ANTIOXIDANTS) TABS Take 1 tablet by mouth daily. 08/05/21   Bary Leriche, PA-C  ?Oxycodone HCl 10 MG TABS Take 0.5-1 tablets (5-10 mg total) by mouth every 6 (six) hours as needed for severe pain. 08/05/21   Love, Ivan Anchors, PA-C  ?pantoprazole (PROTONIX) 40 MG tablet Take 1 tablet (40 mg total) by mouth daily. 08/05/21   Love, Ivan Anchors, PA-C  ?polyethylene glycol (MIRALAX / GLYCOLAX) 17 g packet Take 17 g by mouth daily as needed for mild constipation or moderate constipation. 07/20/21   Pokhrel, Corrie Mckusick, MD  ?senna-docusate (SENOKOT-S) 8.6-50 MG tablet Take 1 tablet by mouth 2 (two) times daily. 08/05/21   Love, Ivan Anchors, PA-C  ?umeclidinium bromide (INCRUSE ELLIPTA) 62.5 MCG/ACT AEPB Inhale 1 puff into the lungs daily. 08/05/21   Bary Leriche, PA-C  ? ? ?Physical Exam: ?Vitals:  ? 10/06/21 3893 10/06/21 0645 10/06/21 7342 10/06/21 0836  ?BP:  105/90 109/90 (!) 115/93  ?Pulse:  (!) 103 99 97  ?Resp:  18 16 (!) 25  ?Temp:   97.7 ?F (36.5 ?C)   ?TempSrc:   Oral   ?SpO2: 96% 94% 98% 96%  ?Weight:      ?Height:      ? ?General: 46 y.o. chronically ill appearing male resting in bed in NAD ?Eyes: PERRL, normal sclera ?ENMT: Nares patent w/o discharge, orophaynx clear, dentition normal, ears w/o discharge/lesions/ulcers ?Neck: Supple, trachea midline ?Cardiovascular: RRR, +S1, S2, no m/g/r, equal pulses throughout ?Respiratory: soft rhonchi right side, no w/r, normal WOB ?GI: BS+, NDNT, no masses noted, no organomegaly noted ?MSK: No e/c/c ?Neuro: A&O x 3, right side weakness ?Psyc: Drowsy but, calm/cooperative ? ?Data Reviewed: ? ?Glucose  127 ?BUN  21 ?Scr  0.97 ?Alk phos  294 ?AST  98 ?ALT  132 ?BNP  32.5 ?WBC  5.0 ?Hgb  10.7 ? ?CTA chest ?CT ab/pelvis ?IMPRESSION: ?1. Respiratory motion degrades detail of lower lobe pulmonary arteries. No acute  pulmonary embolus identified. ?2. Emphysema (ICD10-J43.9) with progressed Mass-like peripheral Right Upper Lobe opacity since January. This is now more suspicious for Malignancy than unresolved Pneumonia. No pleural effusion. ?3. But at the same time the previous widespread lymphadenopathy in the chest and abdomen has regressed - compatible with regression of an HIV related lymphoproliferative disorder or leukemia/lymphoma. ?4. No other acute or inflammatory process in the chest, abdomen, or pelvis. ?5. Calcified coronary artery atherosclerosis and Aortic Atherosclerosis (ICD10-I70.0). ? ?Assessment and Plan: ?No notes have been filed under this hospital service. ?Service: Hospitalist ?Emphysema Exacerbation ?    - place in obs, tele ?    - nebs, rocephin, guaifenesin ?    - imaging as above ? ?RUL Pulmonary opacity ?    - as seen on CT ?    - spoke with PCCM about  possibility of CA, they will follow ?    - our other concern would be PNA, he's on zithro and bactrim at baseline d/t CD4 counts ?    - spoke with ID Re: abx coverage for possible RUL PNA; rec'd adding rocephin to current regimen and checking sputum; looks like he has Hx of disseminated MAC per previous admission ? ?HIV/AIDS ?PML ?Disseminated MAC ?    - check CD4 counts ?    - continue biktarvy, bactrim, azithro, ethambutol ?    - ID consulted; will review case ? ?GERD ?    - protonix ? ?Hx of CVA ?    - residual right side weakness ?    - continue home ASA, hold statin d/t elevated LFTs ? ?Elevated LFTs ?    - check hepatitis panel ?    - CT ab/pelvis is negative ? ?Advance Care Planning:   Code Status: FULL ? ?Consults: PCCM, ID ? ?Family Communication: None at bedside ? ?Severity of Illness: ?The appropriate patient status for this patient is OBSERVATION. Observation status is judged to be reasonable and necessary in order to provide the required intensity of service to ensure the patient's safety. The patient's presenting symptoms, physical exam  findings, and initial radiographic and laboratory data in the context of their medical condition is felt to place them at decreased risk for further clinical deterioration. Furthermore, it is anticipated that

## 2021-10-06 NOTE — Consult Note (Signed)
?   ? ? ? ? ?[facility]   ? ?Date of Admission:  10/06/2021    ? ?Total days of antibiotics 0  ? Ceftriaxone  ? Fluconazole  ?        ?      ?Reason for Consult: HIV, Dyspnea    ?Referring Provider: Marylyn Ishihara ?Primary Care Provider: Gildardo Pounds, NP  ? ?Assessment: ?Kurt Foley is a 46 y.o. male well known to our team with advanced HIV/AIDS complicated by PML, disseminated MAC infection (on tx) with history of emphysema, CVA and prior tobacco use. Admitted for assistance and management with CT imaging concerning now for a more mass-like effect in the RUL concerning for malignancy vs un resolving PNA.  ?He has been on bactrim ppx and unlikely related to PJP.  ?Would continue ceftriaxone, inhalers (he said he needs a new prescription for these) and antitussives. Getting azithro already as part of MAC tx so unlikely legionella playing a role.  ? ?Clinically he actually looks good compared to previous acute visits with him - has put on some weight and fluid in conversation. His viral load is suppressed and slowly his CD4 is improving. Hx PML with motor function deficits - hopeful that his condition will not advance as his immune system gets stronger. Continue Biktarvy daily as he has been taking.  ? ?Hx of disseminated MAC - continue ethambutol and azithromycin  ? ?Fungal skin infection - tx with fluconazole  ? ? ?Plan: ?Continue biktarvy, bactrim prophy, azithro + ethambutol ?Continue ceftriaxone ?Follow any sputum cx ?Agree with bronchoscopy and need for biopsy here --> timing per pulm recs ?Add fluconazole for tinea infection of the face/head ?  ? ?Principal Problem: ?  Dyspnea ?Active Problems: ?  Human immunodeficiency virus (HIV) disease ([place]) ?  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[place]) ?  Emphysema, unspecified ([place]) ?  History of cardioembolic cerebrovascular accident (CVA) ?  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  Abnormal LFTs ?  Abnormal  pulmonary finding ? ? ? albuterol  5 mg Nebulization Once  ? albuterol  5 mg Nebulization Once  ? aspirin  81 mg Oral Daily  ? azithromycin  500 mg Oral QHS  ? bictegravir-emtricitabine-tenofovir AF  1 tablet Oral q morning  ? enoxaparin (LOVENOX) injection  40 mg Subcutaneous Q24H  ? ethambutol  800 mg Oral Daily  ? fluconazole  200 mg Oral Daily  ? guaiFENesin  600 mg Oral BID  ? ipratropium-albuterol  3 mL Nebulization TID  ? [START ON 10/07/2021] loratadine  10 mg Oral q morning  ? [START ON 10/07/2021] megestrol  400 mg Oral q morning  ? mirtazapine  15 mg Oral QHS  ? pantoprazole  40 mg Oral Daily  ? QUEtiapine  200 mg Oral QHS  ? sodium chloride (PF)      ? ? ?HPI: Kurt Foley is a 46 y.o. male here for treatment for worsening dyspnea.  ? ?Kurt Foley has a history of advanced HIV/AIDS, disseminated MAC infection and PML with motor function deficits, Emphysema (quit tobacco use in the past). Last saw [name] 4/5 in the office - was reporting a cough that has been present for 53mat that time. Paired with itchy and swollen eyes and nasal congestion - he was treated with loratadine after no new findings on CXR: IMPRESSION: Stable pleuroparenchymal scar-like opacities in the right upper and left lower lobes. Stable  COPD chest. No new abnormality. Aortic atherosclerosis.  ? ?  Over the last week he has noticed some significant and new onset dyspnea with continued dry cough. Came to ER for evaluation - noted on CT scan that there is a progressed now more mass-like RUL opacity that has persisted since January more suspicious for malignancy vs unresolved PNA. Improved widespread lymphadenopathy in the chest/abdomen.  ? ?He does not report any fevers at home - does feel significantly better regarding SOB. Continues to have a dry cough, but overall improved.  He has also noticed a rash over his beard, head/scalp and face that over the last week has continued to get very itchy and now hypopigmented in color.   ? ? ?Review of Systems: ?Review of Systems  ?Constitutional:  Negative for chills and fever.  ?Respiratory:  Positive for cough and shortness of breath. Negative for sputum production.   ?Cardiovascular:  Negative for chest pain.  ?Gastrointestinal:  Negative for abdominal pain, nausea and vomiting.  ?Genitourinary: Negative.   ?Skin:  Positive for itching and rash.  ? ?Past Medical History:  ?Diagnosis Date  ? Depression   ? HIV infection ([place])   ? ? ?Social History  ? ?Tobacco Use  ? Smoking status: Former  ?  Packs/day: 0.50  ?  Years: 25.00  ?  Pack years: 12.50  ?  Types: Cigarettes  ? Smokeless tobacco: Never  ? Tobacco comments:  ?  cutting back  ?Vaping Use  ? Vaping Use: Never used  ?Substance Use Topics  ? Alcohol use: Not Currently  ?  Alcohol/week: 2.0 standard drinks  ?  Types: 2 Standard drinks or equivalent per week  ?  Comment: 3-4 days a week.Last drink: today  ? Drug use: Not Currently  ?  Types: Marijuana, Cocaine  ?  Comment: Once a week. Last used yesterday.  Cocaine last used today.   ? ? ?Family History  ?Problem Relation Age of Onset  ? Rheum arthritis Mother   ? Diabetes Mother   ? Hypertension Mother   ? Arthritis Mother   ? ?Allergies  ?Allergen Reactions  ? Lidocaine Other (See Comments)  ?  Large bruise at site of patch placement  ? ? ?OBJECTIVE: ?Blood pressure (!) 115/95, pulse 97, temperature 97.6 ?F (36.4 ?C), temperature source Oral, resp. rate (!) 24, height _0  (1.651 m), weight 63.5 kg, SpO2 94 %. ? ?Physical Exam ?Constitutional:   ?   Appearance: He is well-developed.  ?   Comments: Undernourished, though improved from prior.   ?Eyes:  ?   Pupils: Pupils are equal, round, and reactive to light.  ?Cardiovascular:  ?   Rate and Rhythm: Normal rate and regular rhythm.  ?Pulmonary:  ?   Effort: Pulmonary effort is normal. No tachypnea or accessory muscle usage.  ?   Breath sounds: Normal breath sounds.  ?Skin: ?   General: Skin is warm and dry.  ?   Comments: Hypopigmented  macules coalesced along beard and nose. More annular red/hypopigmented rash to the left temporal scalp.   ?Neurological:  ?   Mental Status: He is alert.  ? ? ? ? ? ? ? ? ? ? ?Lab Results ?Lab Results  ?Component Value Date  ? WBC 5.0 10/06/2021  ? HGB 10.7 (L) 10/06/2021  ? HCT 33.9 (L) 10/06/2021  ? MCV 90.9 10/06/2021  ? PLT 295 10/06/2021  ?  ?Lab Results  ?Component Value Date  ? CREATININE 0.97 10/06/2021  ? BUN 21 (H) 10/06/2021  ? NA 135 10/06/2021  ? K 4.6  10/06/2021  ? CL 110 10/06/2021  ? CO2 16 (L) 10/06/2021  ?  ?Lab Results  ?Component Value Date  ? ALT 132 (H) 10/06/2021  ? AST 98 (H) 10/06/2021  ? ALKPHOS 294 (H) 10/06/2021  ? BILITOT 0.8 10/06/2021  ?  ? ?Microbiology: ?Recent Results (from the past 240 hour(s))  ?Resp Panel by RT-PCR (Flu A&B, Covid) Nasopharyngeal Swab     Status: None  ? Collection Time: 10/06/21  3:20 AM  ? Specimen: Nasopharyngeal Swab; Nasopharyngeal(NP) swabs in vial transport medium  ?Result Value Ref Range Status  ? SARS Coronavirus 2 by RT PCR NEGATIVE NEGATIVE Final  ?  Comment: (NOTE) ?SARS-CoV-2 target nucleic acids are NOT DETECTED. ? ?The SARS-CoV-2 RNA is generally detectable in upper respiratory ?specimens during the acute phase of infection. The lowest ?concentration of SARS-CoV-2 viral copies this assay can detect is ?138 copies/mL. A negative result does not preclude SARS-Cov-2 ?infection and should not be used as the sole basis for treatment or ?other patient management decisions. A negative result may occur with  ?improper specimen collection/handling, submission of specimen other ?than nasopharyngeal swab, presence of viral mutation(s) within the ?areas targeted by this assay, and inadequate number of viral ?copies(<138 copies/mL). A negative result must be combined with ?clinical observations, patient history, and epidemiological ?information. The expected result is Negative. ? ?Fact Sheet for Patients:  ?EntrepreneurPulse.com.au ? ?Fact  Sheet for Healthcare Providers:  ?IncredibleEmployment.be ? ?This test is no t yet approved or cleared by the [place] FDA and  ?has been authorized for detection and/or diagnosis of SARS-CoV-2 by

## 2021-10-06 NOTE — ED Notes (Signed)
RN called lab to see about BNP results. It had not been run. She is running now ?

## 2021-10-07 ENCOUNTER — Telehealth: Payer: Self-pay | Admitting: Pulmonary Disease

## 2021-10-07 ENCOUNTER — Other Ambulatory Visit: Payer: Self-pay | Admitting: Student

## 2021-10-07 ENCOUNTER — Other Ambulatory Visit (HOSPITAL_COMMUNITY): Payer: Self-pay

## 2021-10-07 DIAGNOSIS — B350 Tinea barbae and tinea capitis: Secondary | ICD-10-CM | POA: Diagnosis not present

## 2021-10-07 DIAGNOSIS — R7989 Other specified abnormal findings of blood chemistry: Secondary | ICD-10-CM | POA: Diagnosis not present

## 2021-10-07 DIAGNOSIS — R0600 Dyspnea, unspecified: Secondary | ICD-10-CM | POA: Diagnosis not present

## 2021-10-07 DIAGNOSIS — R918 Other nonspecific abnormal finding of lung field: Secondary | ICD-10-CM | POA: Diagnosis not present

## 2021-10-07 DIAGNOSIS — B20 Human immunodeficiency virus [HIV] disease: Secondary | ICD-10-CM

## 2021-10-07 DIAGNOSIS — J439 Emphysema, unspecified: Secondary | ICD-10-CM

## 2021-10-07 DIAGNOSIS — A310 Pulmonary mycobacterial infection: Secondary | ICD-10-CM | POA: Diagnosis not present

## 2021-10-07 DIAGNOSIS — Z8673 Personal history of transient ischemic attack (TIA), and cerebral infarction without residual deficits: Secondary | ICD-10-CM

## 2021-10-07 DIAGNOSIS — A812 Progressive multifocal leukoencephalopathy: Secondary | ICD-10-CM

## 2021-10-07 DIAGNOSIS — J22 Unspecified acute lower respiratory infection: Secondary | ICD-10-CM

## 2021-10-07 DIAGNOSIS — B354 Tinea corporis: Secondary | ICD-10-CM

## 2021-10-07 LAB — CBC
HCT: 31.3 % — ABNORMAL LOW (ref 39.0–52.0)
Hemoglobin: 9.5 g/dL — ABNORMAL LOW (ref 13.0–17.0)
MCH: 28.4 pg (ref 26.0–34.0)
MCHC: 30.4 g/dL (ref 30.0–36.0)
MCV: 93.4 fL (ref 80.0–100.0)
Platelets: 293 10*3/uL (ref 150–400)
RBC: 3.35 MIL/uL — ABNORMAL LOW (ref 4.22–5.81)
RDW: 18.6 % — ABNORMAL HIGH (ref 11.5–15.5)
WBC: 7.5 10*3/uL (ref 4.0–10.5)
nRBC: 0 % (ref 0.0–0.2)

## 2021-10-07 LAB — COMPREHENSIVE METABOLIC PANEL
ALT: 107 U/L — ABNORMAL HIGH (ref 0–44)
AST: 57 U/L — ABNORMAL HIGH (ref 15–41)
Albumin: 3.1 g/dL — ABNORMAL LOW (ref 3.5–5.0)
Alkaline Phosphatase: 232 U/L — ABNORMAL HIGH (ref 38–126)
Anion gap: 7 (ref 5–15)
BUN: 18 mg/dL (ref 6–20)
CO2: 16 mmol/L — ABNORMAL LOW (ref 22–32)
Calcium: 9.2 mg/dL (ref 8.9–10.3)
Chloride: 115 mmol/L — ABNORMAL HIGH (ref 98–111)
Creatinine, Ser: 0.82 mg/dL (ref 0.61–1.24)
GFR, Estimated: >60 mL/min (ref >60)
Glucose, Bld: 123 mg/dL — ABNORMAL HIGH (ref 70–99)
Potassium: 4.7 mmol/L (ref 3.5–5.1)
Sodium: 138 mmol/L (ref 135–145)
Total Bilirubin: 0.3 mg/dL (ref 0.3–1.2)
Total Protein: 7.6 g/dL (ref 6.5–8.1)

## 2021-10-07 LAB — LEGIONELLA PNEUMOPHILA SEROGP 1 UR AG: L. pneumophila Serogp 1 Ur Ag: NEGATIVE

## 2021-10-07 LAB — EXPECTORATED SPUTUM ASSESSMENT W GRAM STAIN, RFLX TO RESP C

## 2021-10-07 LAB — T-HELPER CELLS (CD4) COUNT (NOT AT ARMC)

## 2021-10-07 MED ORDER — AMOXICILLIN-POT CLAVULANATE 875-125 MG PO TABS
1.0000 | ORAL_TABLET | Freq: Two times a day (BID) | ORAL | 0 refills | Status: DC
  Filled 2021-10-07: qty 6, 3d supply, fill #0

## 2021-10-07 MED ORDER — GUAIFENESIN ER 600 MG PO TB12: 600.0000 mg | ORAL_TABLET | Freq: Two times a day (BID) | ORAL | Status: AC

## 2021-10-07 MED ORDER — BUDESONIDE-FORMOTEROL FUMARATE 160-4.5 MCG/ACT IN AERO
2.0000 | INHALATION_SPRAY | Freq: Two times a day (BID) | RESPIRATORY_TRACT | 1 refills | Status: AC
  Filled 2021-10-07: qty 10.2, 30d supply, fill #0

## 2021-10-07 MED ORDER — FLUTICASONE FUROATE-VILANTEROL 100-25 MCG/ACT IN AEPB
1.0000 | INHALATION_SPRAY | Freq: Every day | RESPIRATORY_TRACT | 1 refills | Status: DC
  Filled 2021-10-07: qty 60, 60d supply, fill #0
  Filled 2021-10-07: qty 120, 60d supply, fill #0
  Filled 2021-10-07: qty 60, 30d supply, fill #0
  Filled 2021-10-07: qty 60, 60d supply, fill #0

## 2021-10-07 MED ORDER — SULFAMETHOXAZOLE-TRIMETHOPRIM 800-160 MG PO TABS
1.0000 | ORAL_TABLET | Freq: Every day | ORAL | 11 refills | Status: DC
  Filled 2021-10-07 (×2): qty 30, 30d supply, fill #0

## 2021-10-07 MED ORDER — AMOXICILLIN-POT CLAVULANATE 875-125 MG PO TABS: 1.0000 | ORAL_TABLET | Freq: Two times a day (BID) | ORAL | 0 refills | Status: AC

## 2021-10-07 MED ORDER — NYSTATIN 100000 UNIT/GM EX CREA: 1.0000 "application " | TOPICAL_CREAM | Freq: Two times a day (BID) | CUTANEOUS | 0 refills | Status: DC

## 2021-10-07 MED ORDER — METOPROLOL SUCCINATE ER 50 MG PO TB24
50.0000 mg | ORAL_TABLET | Freq: Every morning | ORAL | 0 refills | Status: DC
  Filled 2021-10-07: qty 90, 90d supply, fill #0
  Filled 2021-10-07: qty 30, 30d supply, fill #0

## 2021-10-07 MED ORDER — ETHAMBUTOL HCL 400 MG PO TABS
800.0000 mg | ORAL_TABLET | Freq: Every day | ORAL | 0 refills | Status: DC
  Filled 2021-10-07 (×2): qty 60, 30d supply, fill #0

## 2021-10-07 MED ORDER — FLUCONAZOLE 200 MG PO TABS: 200.0000 mg | ORAL_TABLET | Freq: Every day | ORAL | 0 refills | Status: DC

## 2021-10-07 MED ORDER — AMOXICILLIN-POT CLAVULANATE 875-125 MG PO TABS
1.0000 | ORAL_TABLET | Freq: Two times a day (BID) | ORAL | Status: DC
  Administered 2021-10-07: 1 via ORAL
  Filled 2021-10-07: qty 1

## 2021-10-07 MED ORDER — AZITHROMYCIN 500 MG PO TABS
500.0000 mg | ORAL_TABLET | Freq: Every day | ORAL | 0 refills | Status: DC
  Filled 2021-10-07: qty 10, 10d supply, fill #0
  Filled 2021-10-07: qty 30, 30d supply, fill #0

## 2021-10-07 MED ORDER — AMOXICILLIN-POT CLAVULANATE 875-125 MG PO TABS: 1.0000 | ORAL_TABLET | Freq: Two times a day (BID) | ORAL | Status: DC

## 2021-10-07 MED ORDER — UMECLIDINIUM BROMIDE 62.5 MCG/ACT IN AEPB
1.0000 | INHALATION_SPRAY | Freq: Every day | RESPIRATORY_TRACT | 1 refills | Status: AC
  Filled 2021-10-07 (×2): qty 30, 30d supply, fill #0

## 2021-10-07 MED ORDER — NYSTATIN 100000 UNIT/GM EX POWD
1.0000 "application " | Freq: Three times a day (TID) | CUTANEOUS | 0 refills | Status: DC
  Filled 2021-10-07: qty 30, 10d supply, fill #0

## 2021-10-07 NOTE — Assessment & Plan Note (Addendum)
Multifactorial including COPD exacerbation, community-acquired pneumonia versus malignancy.  CTA chest negative for PE but emphysema and with RUL opacity.  Blood cultures NGTD.  Afebrile.  No leukocytosis.  Legionella and strep pneumo urinary antigen negative. ?-Received IV ceftriaxone and azithromycin in house for possible community-acquired pneumonia. ?-Discharged on p.o. Augmentin for 3 more days and home azithromycin to complete treatment course. ?-Patient to continue his home ethambutol, Bactrim and Biktarvy. ?-Pulmonology to arrange outpatient follow-up for bronchoscopy ?-ID to arrange outpatient follow-up. ?-Renewed prescription for home Breo Ellipta and Incruse Ellipta ?

## 2021-10-07 NOTE — Hospital Course (Addendum)
46yo male with a PMH significant for HIV/AIDS, PML, disseminated MAC, prior CVA with right residual weakness, and depression who presented to the [facility] ED 4/27 for complaints of dyspnea and generalized weakness. Patient states that dyspnea started one week prior to admission and is associated with dry cough and subjective fever.  ? ?On ED arrival patient was seen  hemodynamically stable with mild tachycardia and tachypnea.  Lab work was significant for CO2 16, mildly elevated LFTs, and hgb 10.7. CTA chest was obtained and was negative for acute PE but revealed progressive mass-like peripheral RUL opacity concerning for unresolved pneumonia or malignancy.  ID and pulmonology consulted.  ID recommended treating for community-acquired pneumonia.  Pulmonology to arrange navigational bronchoscopy outpatient after antibiotics. ? ?On the day of discharge, patient's symptoms improved.  Blood cultures NGTD.  He was not able to expectorate for respiratory culture.  He was ambulated on room air and maintain appropriate saturation.  He was cleared for discharge on p.o. Augmentin for 3 more days and azithromycin in addition to his home Bactrim and ethambutol.  He was also started on Diflucan and nystatin cream for tinea corporis and tenia facialis.  Home inhalers including Breo Ellipta and Incruse Ellipta renewed and filled prior to discharge. ? ?Therapy recommended outpatient PT. Referral ordered.  ? ? ?

## 2021-10-07 NOTE — Progress Notes (Signed)
?   ? ? ? ? ?[facility] ? ?Date of Admission:  10/06/2021    ? ? ?Total days of antibiotics 2 ? Augmentin day 1 ? Ceftriaxone x 1 dose  ?       ? ?ASSESSMENT: ?Kurt Foley is a 46 y.o. 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 Ready for conversion to oral therapy as he is significantly improved and non-hypoxic.  ?Will continue treatment with augmentin BID to complete 7 days total (2/5 days today).  ? ?Will continue fluconazole for 2 weeks and add topical nystatin cream for cutaneous tinea infection on his face.  ? ?He can get his Biktarvy, azithromycin, augmentin, bactrim, fluconazole, ethambutol for free through the [employer] / [place] program when filled at [facility] on [address]. Will need match assistance for inhalers and patient assistance to continue them; encouraged him to go to the pulmonology team for follow up on timing for bronchoscopy. He agrees this is important.  ? ?He has follow up arranged with [name] in early May and is aware of the appointment.  ? ? ? ?PLAN: ?MATCH for inhalers please ?All other meds to go to [facility] on [address] (he is due to fill ethambutol and azithromycin) ?Nystatin to also use on facial rash  ?FU with [name] arranged  ? ? ?Principal Problem: ?  Dyspnea ?Active Problems: ?  Human immunodeficiency virus (HIV) disease ([place]) ?  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[place]) ?  Emphysema, unspecified ([place]) ?  History of cardioembolic cerebrovascular accident (CVA) ?  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[place]) ?  Abnormal LFTs ?  Mass of upper lobe of right lung ? ? ? albuterol  5 mg Nebulization Once  ? albuterol  5 mg Nebulization Once  ? amoxicillin-clavulanate  1 tablet Oral Q12H  ? arformoterol  15 mcg Nebulization BID  ? aspirin  81 mg  Oral Daily  ? azithromycin  500 mg Oral QHS  ? bictegravir-emtricitabine-tenofovir AF  1 tablet Oral q morning  ? enoxaparin (LOVENOX) injection  40 mg Subcutaneous Q24H  ? ethambutol  800 mg Oral Daily  ? fluconazole  200 mg Oral Daily  ? guaiFENesin  600 mg Oral BID  ? loratadine  10 mg Oral q morning  ? megestrol  400 mg Oral q morning  ? mirtazapine  15 mg Oral QHS  ? pantoprazole  40 mg Oral Daily  ? QUEtiapine  200 mg Oral QHS  ? revefenacin  175 mcg Nebulization Daily  ? ? ?SUBJECTIVE: ?Kurt Foley has been doing well today. Breathing better and appreciates his care.  ? ? ?Review of Systems: ?Review of Systems  ?Constitutional:  Negative for chills and fever.  ?Respiratory:  Positive for cough (dry). Negative for sputum production and shortness of breath.   ?Gastrointestinal:  Negative for abdominal pain, nausea and vomiting.  ?Genitourinary: Negative.   ?Musculoskeletal: Negative.   ?Skin:  Positive for itching and rash.  ? ?Allergies  ?Allergen Reactions  ? Lidocaine Other (See Comments)  ?  Large bruise at site of patch placement  ? ? ?OBJECTIVE: ?Vitals:  ? 10/07/21 0002 10/07/21 0510 10/07/21 0177 10/07/21 0813  ?BP: 112/82 (!) 138/106  104/81  ?Pulse: (!) 106 (!) 103  (!) 102  ?Resp: 19 18 (!) 24 20  ?Temp: 97.9 ?F (36.6 ?C)  97.8 ?F (36.6 ?C)  ?TempSrc: Oral   Oral  ?SpO2: 95% 100% 99% 97%  ?Weight:      ?Height:      ? ?Body mass index is 23.3 kg/m?. ? ?Physical Exam ?Vitals reviewed.  ?Constitutional:   ?   Appearance: He is well-developed. He is not ill-appearing.  ?   Comments: Sitting on the side of the bed, appears comfortable   ?Cardiovascular:  ?   Rate and Rhythm: Normal rate and regular rhythm.  ?Pulmonary:  ?   Effort: Pulmonary effort is normal. No tachypnea or accessory muscle usage.  ?Skin: ?   General: Skin is warm and dry.  ?   Comments: Hypopigmented rash noted to face, same as yesterday   ?Neurological:  ?   Mental Status: He is alert.  ? ? ?Lab Results ?Lab Results  ?Component Value  Date  ? WBC 7.5 10/07/2021  ? HGB 9.5 (L) 10/07/2021  ? HCT 31.3 (L) 10/07/2021  ? MCV 93.4 10/07/2021  ? PLT 293 10/07/2021  ?  ?Lab Results  ?Component Value Date  ? CREATININE 0.82 10/07/2021  ? BUN 18 10/07/2021  ? NA 138 10/07/2021  ? K 4.7 10/07/2021  ? CL 115 (H) 10/07/2021  ? CO2 16 (L) 10/07/2021  ?  ?Lab Results  ?Component Value Date  ? ALT 107 (H) 10/07/2021  ? AST 57 (H) 10/07/2021  ? ALKPHOS 232 (H) 10/07/2021  ? BILITOT 0.3 10/07/2021  ?  ? ?Microbiology: ?Recent Results (from the past 240 hour(s))  ?Resp Panel by RT-PCR (Flu A&B, Covid) Nasopharyngeal Swab     Status: None  ? Collection Time: 10/06/21  3:20 AM  ? Specimen: Nasopharyngeal Swab; Nasopharyngeal(NP) swabs in vial transport medium  ?Result Value Ref Range Status  ? SARS Coronavirus 2 by RT PCR NEGATIVE NEGATIVE Final  ?  Comment: (NOTE) ?SARS-CoV-2 target nucleic acids are NOT DETECTED. ? ?The SARS-CoV-2 RNA is generally detectable in upper respiratory ?specimens during the acute phase of infection. The lowest ?concentration of SARS-CoV-2 viral copies this assay can detect is ?138 copies/mL. A negative result does not preclude SARS-Cov-2 ?infection and should not be used as the sole basis for treatment or ?other patient management decisions. A negative result may occur with  ?improper specimen collection/handling, submission of specimen other ?than nasopharyngeal swab, presence of viral mutation(s) within the ?areas targeted by this assay, and inadequate number of viral ?copies(<138 copies/mL). A negative result must be combined with ?clinical observations, patient history, and epidemiological ?information. The expected result is Negative. ? ?Fact Sheet for Patients:  ?EntrepreneurPulse.com.au ? ?Fact Sheet for Healthcare Providers:  ?IncredibleEmployment.be ? ?This test is no t yet approved or cleared by the [place] FDA and  ?has been authorized for detection and/or diagnosis of SARS-CoV-2 by ?FDA  under an Emergency Use Authorization (EUA). This EUA will remain  ?in effect (meaning this test can be used) for the duration of the ?COVID-19 declaration under Section 564(b)(1) of the Act, 21 ?U.S.C.section 360bbb-3(b)(1), unless the authorization is terminated  ?or revoked sooner.  ? ? ?  ? Influenza A by PCR NEGATIVE NEGATIVE Final  ? Influenza B by PCR NEGATIVE NEGATIVE Final  ?  Comment: (NOTE) ?The Xpert Xpress SARS-CoV-2/FLU/RSV plus assay is intended as an aid ?in the diagnosis of influenza from Nasopharyngeal swab specimens and ?should not be used as a sole basis for treatment. Nasal washings and ?aspirates are unacceptable for Xpert Xpress SARS-CoV-2/FLU/RSV ?testing. ? ?Fact Sheet for Patients: ?EntrepreneurPulse.com.au ? ?Fact  Sheet for Healthcare Providers: ?IncredibleEmployment.be ? ?This test is not yet approved or cleared by the [place] FDA and ?has been authorized for detection and/or diagnosis of SARS-CoV-2 by ?FDA under an Emergency Use Authorization (EUA). This EUA will remain ?in effect (meaning this test can be used) for the duration of the ?COVID-19 declaration under Section 564(b)(1) of the Act, 21 U.S.C. ?section 360bbb-3(b)(1), unless the authorization is terminated or ?revoked. ? ?Performed at [facility], [address] ?[place], [place] [address] ?  ?Expectorated Sputum Assessment w Gram Stain, Rflx to Resp Cult     Status: None  ? Collection Time: 10/06/21  9:13 AM  ? Specimen: Sputum  ?Result Value Ref Range Status  ? Specimen Description SPUTUM  Final  ? Special Requests NONE  Final  ? Sputum evaluation   Final  ?  THIS SPECIMEN IS ACCEPTABLE FOR SPUTUM CULTURE ?Performed at [facility], [place] [address] [place], [place] [address] ?  ? Report Status 10/07/2021 FINAL  Final  ?MRSA Next Gen by PCR, Nasal     Status: None  ? Collection Time: 10/06/21 11:34 AM  ? Specimen: Nasal Mucosa; Nasal Swab  ?Result  Value Ref Range Status  ? MRSA by PCR Next Gen NOT DETECTED NOT DETECTED Final  ?  Comment: (NOTE) ?The GeneXpert MRSA Assay (FDA approved for NASAL specimens only), ?is one component of a comprehens

## 2021-10-07 NOTE — Telephone Encounter (Signed)
Please schedule patient for follow up in 2-3 weeks with Dr. Tonia Brooms or Dr. Delton Coombes for RUL mass and discussion of possible nav bronch for biopsies. ? ?Thanks, ?JD ?

## 2021-10-07 NOTE — Assessment & Plan Note (Signed)
Ambulatory referral to PT per PT recommendation ?Patient has walker at home. ?

## 2021-10-07 NOTE — Assessment & Plan Note (Signed)
Improved. °-Recheck in 1 to 2 weeks °

## 2021-10-07 NOTE — Evaluation (Signed)
Occupational Therapy Evaluation ?Patient Details ?Name: Kurt Foley ?MRN: [id number] ?DOB: 06-21-1975 ?Today's Date: 10/07/2021 ? ? ?History of Present Illness Pt 46 yo male admitted on 10/06/21 with dyspnea.  Found to R UL mass concerning for unresolved PNE vs malignancy.  Pt with hx of HIV/AIDS, PML, disseminated MAC, prior CVA with right residual weakness, and depression  ? ?Clinical Impression ?  ?Mr. Kurt Foley is a 46 year old man with right sided hemiplegia. On evaluation he demonstrated his recent baseline abilities with mobility and ADLs. His o2 sat was 96-98% on room air with ambulation. Recommend patient pursue outpatient therapy after discharge.   ?   ? ?Recommendations for follow up therapy are one component of a multi-disciplinary discharge planning process, led by the attending physician.  Recommendations may be updated based on patient status, additional functional criteria and insurance authorization.  ? ?Follow Up Recommendations ? Outpatient OT  ?  ?Assistance Recommended at Discharge Intermittent Supervision/Assistance  ?Patient can return home with the following A little help with bathing/dressing/bathroom;Help with stairs or ramp for entrance;Assistance with cooking/housework ? ?  ?Functional Status Assessment ? Patient has not had a recent decline in their functional status  ?Equipment Recommendations ? None recommended by OT  ?  ?Recommendations for Other Services   ? ? ?  ?Precautions / Restrictions Precautions ?Precautions: None ?Restrictions ?Weight Bearing Restrictions: No  ? ?  ? ?Mobility Bed Mobility ?Overal bed mobility: Independent ?  ?  ?  ?  ?  ?  ?General bed mobility comments: sitting at arrival ?  ? ?Transfers ?Overall transfer level: Modified independent ?Equipment used: Hemi-walker ?  ?  ?  ?  ?  ?  ?  ?General transfer comment: Stood safely with hemiwalker ?  ? ?  ?Balance   ?Sitting-balance support: No upper extremity supported ?Sitting balance-Leahy Scale: Normal ?   ?  ?Standing balance support: Single extremity supported ?Standing balance-Leahy Scale: Fair ?  ?  ?  ?  ?  ?  ?  ?  ?  ?  ?  ?  ?   ? ?ADL either performed or assessed with clinical judgement  ? ?ADL Overall ADL's : At baseline ?  ?  ?  ?  ?  ?  ?  ?  ?  ?  ?  ?  ?  ?  ?  ?  ?  ?  ?  ?   ? ? ? ?Vision Patient Visual Report: No change from baseline ?   ?   ?Perception   ?  ?Praxis   ?  ? ?Pertinent Vitals/Pain Pain Assessment ?Pain Assessment: No/denies pain  ? ? ? ?Hand Dominance Right ?  ?Extremity/Trunk Assessment Upper Extremity Assessment ?Upper Extremity Assessment: Defer to OT evaluation ?RUE Deficits / Details: shoulder approx 45 dgrees abduction, partial ative elbow flexion, has full passive flexion/extension - limited active elbow extesion, has minimal wrist extension, supination. Fingers and wrist can be extended to neutral with passive ROM. Increased time. ?RUE Sensation: WNL ?RUE Coordination: decreased fine motor;decreased gross motor ?LUE Deficits / Details: WFL ROM and strength ?LUE Sensation: WNL ?LUE Coordination: WNL ?  ?Lower Extremity Assessment ?Lower Extremity Assessment: LLE deficits/detail;RLE deficits/detail ?RLE Deficits / Details: R residual weakness; ROM: WFL; MMT: hip and knee 4/5; ankle has on AFO, not removed, pt reporst 0/5, did not note any contraction in DF ?LLE Deficits / Details: ROM WFL; MMT 5/5 ?  ?Cervical / Trunk Assessment ?Cervical / Trunk Assessment: Normal ?  ?  Communication Communication ?Communication: Expressive difficulties (able but with increased time) ?  ?Cognition Arousal/Alertness: Awake/alert ?Behavior During Therapy: [facility] for tasks assessed/performed ?Overall Cognitive Status: Within Functional Limits for tasks assessed ?  ?  ?  ?  ?  ?  ?  ?  ?  ?  ?  ?  ?  ?  ?  ?  ?  ?  ?  ?General Comments  Pt on RA sats 98% rest, 96% ambulation ? ?  ?Exercises   ?  ?Shoulder Instructions    ? ? ?Home Living Family/patient expects to be discharged to:: Private residence  (stays at grandma's most of the time; aunt's sometimes) ?Living Arrangements: Other relatives (aunt or grandma) ?Available Help at Discharge: Family;Available 24 hours/day (aunt, cousin, and grandma) ?Type of Home: House ?Home Access: Stairs to enter ?Entrance Stairs-Number of Steps: curb ?  ?Home Layout: One level ?  ?  ?Bathroom Shower/Tub: Tub/shower unit ?  ?  ?  ?  ?Home Equipment: Conservation officer, nature (2 wheels);Cane - single point;Shower seat ?  ?Additional Comments: Hemi-walker; AFO ?  ? ?  ?Prior Functioning/Environment   ?  ?  ?  ?  ?  ?  ?Mobility Comments: Uses hemi-walker - walks in house, to car; limited community ?ADLs Comments: Pt has help with ADLs - dressing and bathing ?  ? ?  ?  ?OT Problem List:   ?  ?   ?OT Treatment/Interventions:    ?  ?OT Goals(Current goals can be found in the care plan section) Acute Rehab OT Goals ?OT Goal Formulation: All assessment and education complete, DC therapy  ?OT Frequency:   ?  ? ?Co-evaluation   ?  ?  ?  ?  ? ?  ?AM-PAC OT "6 Clicks" Daily Activity     ?Outcome Measure Help from another person eating Foley?: None ?Help from another person taking care of personal grooming?: None ?Help from another person toileting, which includes using toliet, bedpan, or urinal?: None ?Help from another person bathing (including washing, rinsing, drying)?: A Little ?Help from another person to put on and taking off regular upper body clothing?: A Little ?Help from another person to put on and taking off regular lower body clothing?: A Lot ?6 Click Score: 20 ?  ?End of Session Equipment Utilized During Treatment: Gait belt (hemiwalker) ?Nurse Communication: Mobility status (o2 sats) ? ?Activity Tolerance: Patient tolerated treatment well ?Patient left: in bed;with call bell/phone within reach ? ?OT Visit Diagnosis: Hemiplegia and hemiparesis ?Hemiplegia - Right/Left: Right ?Hemiplegia - dominant/non-dominant: Dominant ?Hemiplegia - caused by: Cerebral infarction  ?              ?Time:  4259-5638 ?OT Time Calculation (min): 21 min ?Charges:  OT General Charges ?$OT Visit: 1 Visit ?OT Evaluation ?$OT Eval Low Complexity: 1 Low ? ?Arita Severtson, OTR/L ?Acute Care Rehab Services  ?Office [phone] ?Pager: [phone]  ? ?Anabelen Kaminsky L Kearia Yin ?10/07/2021, 3:39 PM ?

## 2021-10-07 NOTE — Assessment & Plan Note (Addendum)
Continue home Biktarvy, Bactrim and ethambutol ?Outpatient follow-up with ID ?ID to follow-up on HIV labs. ?

## 2021-10-07 NOTE — Assessment & Plan Note (Signed)
Continue home ethambutol 

## 2021-10-07 NOTE — Assessment & Plan Note (Signed)
Discharge on p.o. Diflucan 200 mg daily and nystatin cream twice daily for 2 weeks per ID recommendation ?

## 2021-10-07 NOTE — Evaluation (Signed)
Physical Therapy Evaluation ?Patient Details ?Name: Kurt Foley ?MRN: [id number] ?DOB: 27-Oct-1975 ?Today's Date: 10/07/2021 ? ?History of Present Illness ? Pt 46 yo male admitted on 10/06/21 with dyspnea.  Found to R UL mass concerning for unresolved PNE vs malignancy.  Pt with hx of HIV/AIDS, PML, disseminated MAC, prior CVA with right residual weakness, and depression  ?Clinical Impression ? Pt admitted with above diagnosis.  He demonstrates safe transfers and gait at Mod I to Supervision level with hemiwalker.  His O2 sats were stable on RA with 2/4 DOE.  Pt presenting with safe mobility to return home and is near current baseline.  He has been trying to get and would benefit from outpt PT/OT - provided pt with list of clinics and instructions to f/u with order with PCP once obtains medicaid.     ?   ? ?Recommendations for follow up therapy are one component of a multi-disciplinary discharge planning process, led by the attending physician.  Recommendations may be updated based on patient status, additional functional criteria and insurance authorization. ? ?Follow Up Recommendations Outpatient PT (pt reports been trying to get oupt PT/OT, just waiting on medicaid card) ? ?  ?Assistance Recommended at Discharge PRN  ?Patient can return home with the following ? Assistance with cooking/housework;A little help with bathing/dressing/bathroom;Help with stairs or ramp for entrance ? ?  ?Equipment Recommendations None recommended by PT  ?Recommendations for Other Services ?    ?  ?Functional Status Assessment Patient has not had a recent decline in their functional status  ? ?  ?Precautions / Restrictions Precautions ?Precautions: None  ? ?  ? ?Mobility ? Bed Mobility ?Overal bed mobility: Independent ?  ?  ?  ?  ?  ?  ?General bed mobility comments: sitting at arrival ?  ? ?Transfers ?Overall transfer level: Modified independent ?Equipment used: Hemi-walker ?  ?  ?  ?  ?  ?  ?  ?General transfer comment: Stood  safely with hemiwalker ?  ? ?Ambulation/Gait ?Ambulation/Gait assistance: Supervision ?Gait Distance (Feet): 100 Feet ?Assistive device: Hemi-walker ?Gait Pattern/deviations: Step-to pattern, Decreased weight shift to right ?Gait velocity: decreased ?  ?  ?General Gait Details: Steady gait with hemi-walker; demonstrated safely ? ?Stairs ?  ?  ?  ?  ?  ? ?Wheelchair Mobility ?  ? ?Modified Rankin (Stroke Patients Only) ?  ? ?  ? ?Balance Overall balance assessment: Needs assistance ?Sitting-balance support: No upper extremity supported ?Sitting balance-Leahy Scale: Normal ?  ?  ?Standing balance support: No upper extremity supported, Single extremity supported ?Standing balance-Leahy Scale: Fair ?Standing balance comment: hemiwalker to ambulate but could static stand and weight shift without AD ?  ?  ?  ?  ?  ?  ?  ?  ?  ?  ?  ?   ? ? ? ?Pertinent Vitals/Pain Pain Assessment ?Pain Assessment: No/denies pain  ? ? ?Home Living Family/patient expects to be discharged to:: Private residence (stays at grandma's most of the time; aunt's sometimes) ?Living Arrangements: Other relatives (aunt or grandma) ?Available Help at Discharge: Family;Available 24 hours/day (aunt, cousin, and grandma) ?Type of Home: House ?Home Access: Stairs to enter ?  ?Entrance Stairs-Number of Steps: curb ?  ?Home Layout: One level ?Home Equipment: Conservation officer, nature (2 wheels);Cane - single point;Shower seat ?Additional Comments: Hemi-walker; AFO  ?  ?Prior Function   ?  ?  ?  ?  ?  ?  ?Mobility Comments: Uses hemi-walker - walks in house, to car;  limited community ?ADLs Comments: Pt has help with ADLs - dressing and bathing ?  ? ? ?Hand Dominance  ? Dominant Hand: Right ? ?  ?Extremity/Trunk Assessment  ? Upper Extremity Assessment ?Upper Extremity Assessment: Defer to OT evaluation ?RUE Deficits / Details: shoulder approx 45 dgrees abduction, partial ative elbow flexion, has full passive flexion/extension - limited active elbow extesion, has minimal  wrist extension, supination. Fingers and wrist can be extended to neutral with passive ROM. Increased time. ?RUE Sensation: WNL ?RUE Coordination: decreased fine motor;decreased gross motor ?LUE Deficits / Details: WFL ROM and strength ?LUE Sensation: WNL ?LUE Coordination: WNL ?  ? ?Lower Extremity Assessment ?Lower Extremity Assessment: LLE deficits/detail;RLE deficits/detail ?RLE Deficits / Details: R residual weakness; ROM: WFL; MMT: hip and knee 4/5; ankle has on AFO, not removed, pt reporst 0/5, did not note any contraction in DF ?LLE Deficits / Details: ROM WFL; MMT 5/5 ?  ? ?Cervical / Trunk Assessment ?Cervical / Trunk Assessment: Normal  ?Communication  ? Communication: Expressive difficulties (able but with increased time)  ?Cognition Arousal/Alertness: Awake/alert ?Behavior During Therapy: [facility] for tasks assessed/performed ?Overall Cognitive Status: Within Functional Limits for tasks assessed ?  ?  ?  ?  ?  ?  ?  ?  ?  ?  ?  ?  ?  ?  ?  ?  ?  ?  ?  ? ?  ?General Comments General comments (skin integrity, edema, etc.): Pt on RA sats 98% rest, 96% ambulation ? ?  ?Exercises    ? ?Assessment/Plan  ?  ?PT Assessment Patient does not need any further PT services (not acute)  ?PT Problem List   ? ?   ?  ?PT Treatment Interventions     ? ?PT Goals (Current goals can be found in the Care Plan section)  ?Acute Rehab PT Goals ?Patient Stated Goal: return home; get outpt PT/OT to advance after CVA ?PT Goal Formulation: All assessment and education complete, DC therapy ? ?  ?Frequency   ?  ? ? ?Co-evaluation   ?  ?  ?  ?  ? ? ?  ?AM-PAC PT "6 Clicks" Mobility  ?Outcome Measure Help needed turning from your back to your side while in a flat bed without using bedrails?: None ?Help needed moving from lying on your back to sitting on the side of a flat bed without using bedrails?: None ?Help needed moving to and from a bed to a chair (including a wheelchair)?: A Little ?Help needed standing up from a chair using your  arms (e.g., wheelchair or bedside chair)?: A Little ?Help needed to walk in hospital room?: A Little ?Help needed climbing 3-5 steps with a railing? : A Little ?6 Click Score: 20 ? ?  ?End of Session Equipment Utilized During Treatment: Gait belt ?Activity Tolerance: Patient tolerated treatment well ?Patient left: in bed;with call bell/phone within reach ?Nurse Communication: Mobility status ?  ?  ? ?Time: 2423-5361 ?PT Time Calculation (min) (ACUTE ONLY): 21 min ? ? ?Charges:   PT Evaluation ?$PT Eval Low Complexity: 1 Low ?  ?  ?   ? ? ?Abran Richard, PT ?Acute Rehab Services ?Pager [phone] ?[facility] [phone] ? ? ?Kurt Foley ?10/07/2021, 1:57 PM ? ?

## 2021-10-07 NOTE — Progress Notes (Signed)
Changed Breo Ellipta to Symbicort since [facility] doesn't pay for Breo again. Sent Rx to [facility] outpatient pharmacy ?

## 2021-10-07 NOTE — TOC Transition Note (Signed)
Transition of Care (TOC) - CM/SW Discharge Note ? ? ?Patient Details  ?Name: Kurt Foley ?MRN: [id number] ?Date of Birth: [birth year] ? ?Transition of Care [facility]) CM/SW Contact:  ?Dessa Phi, RN ?Phone Number: ?10/07/2021, 2:26 PM ? ? ?Clinical Narrative: d/c home. [place] program approved-patient has $3 for each med. Otpt PT referral sent. No further CM needs.   ? ? ? ?Final next level of care: Home/Self Care ?Barriers to Discharge: No Barriers Identified ? ? ?Patient Goals and CMS Choice ?  ?  ?  ? ?Discharge Placement ?  ?           ?  ?  ?  ?  ? ?Discharge Plan and Services ?  ?  ?           ?  ?  ?  ?  ?  ?  ?  ?  ?  ?  ? ?Social Determinants of Health (SDOH) Interventions ?  ? ? ?Readmission Risk Interventions ?   ? View : No data to display.  ?  ?  ?  ? ? ? ? ? ?

## 2021-10-07 NOTE — Progress Notes (Signed)
AVS given to patient and explained at the bedside. Medications and follow up appointments have been explained with pt verbalizing understanding.  

## 2021-10-07 NOTE — Progress Notes (Signed)
SATURATION QUALIFICATIONS: (This note is used to comply with regulatory documentation for home oxygen) ? ?Patient Saturations on Room Air at Rest = 98% ? ?Patient Saturations on Room Air while Ambulating = 96% ? ?Patient Saturations on 0 Liters of oxygen while Ambulating = 96% ? ?Please briefly explain why patient needs home oxygen: O2 not needed ?

## 2021-10-07 NOTE — Discharge Summary (Signed)
? ?Physician Discharge Summary  ?Kurt Foley OYD:[id number] DOB: 08/14/1975 DOA: 10/06/2021 ? ?PCP: Gildardo Pounds, NP ? ?Admit date: 10/06/2021 ?Discharge date: 10/07/2021 ?Admitted From: Home ?Disposition: Home ?Recommendations for Outpatient Follow-up:  ?Pulmonology to arrange outpatient follow-up for bronchoscopy ?Infectious disease to arrange outpatient follow-up ?Please obtain CBC/CMP at follow up ?Please follow up on the following pending results: HIV labs pending ? ?Home Health: Ambulatory referral to PT per PT recommendation ?Equipment/Devices: Patient has walker at home. ? ?Discharge Condition: Stable ?CODE STATUS: Full code ? Follow-up Information   ? ? [facility] Follow up.   ?Specialty: Rehabilitation ?Why: The office will call you to set up appt. ?Contact information: ?[place] ?[phone] ?[place] ?[phone] ? ?  ?  ? ?  ?  ? ?  ? ? ?Hospital course ?46yo male with a PMH significant for HIV/AIDS, PML, disseminated MAC, prior CVA with right residual weakness, and depression who presented to the [facility] ED 4/27 for complaints of dyspnea and generalized weakness. Patient states that dyspnea started one week prior to admission and is associated with dry cough and subjective fever.  ? ?On ED arrival patient was seen  hemodynamically stable with mild tachycardia and tachypnea.  Lab work was significant for CO2 16, mildly elevated LFTs, and hgb 10.7. CTA chest was obtained and was negative for acute PE but revealed progressive mass-like peripheral RUL opacity concerning for unresolved pneumonia or malignancy.  ID and pulmonology consulted.  ID recommended treating for community-acquired pneumonia.  Pulmonology to arrange navigational bronchoscopy outpatient after antibiotics. ? ?On the day of discharge, patient's symptoms improved.  Blood cultures NGTD.  He was not able to expectorate for respiratory culture.  He was  ambulated on room air and maintain appropriate saturation.  He was cleared for discharge on p.o. Augmentin for 3 more days and azithromycin in addition to his home Bactrim and ethambutol.  He was also started on Diflucan and nystatin cream for tinea corporis and tenia facialis.  Home inhalers including Breo Ellipta and Incruse Ellipta renewed and filled prior to discharge. ? ?Therapy recommended outpatient PT. Referral ordered.  ? ?  ? ?See individual problem list below for more on hospital course. ? ?Problems addressed during this hospitalization ?Problem  ?Dyspnea  ?Mass of Upper Lobe of Right Lung  ?Tinea corporis and facialis  ?Abnormal Lfts  ?Mai (Mycobacterium Avium-Intracellulare) Infection (H[facility])  ?Emphysema, Unspecified ([facility])  ?Human Immunodeficiency Virus (Hiv) Disease ([facility])  ?History of CVA with residual right hemiparesis  ?  ?Assessment and Plan: ?* Dyspnea ?Multifactorial including COPD exacerbation, community-acquired pneumonia versus malignancy.  CTA chest negative for PE but emphysema and with RUL opacity.  Blood cultures NGTD.  Afebrile.  No leukocytosis.  Legionella and strep pneumo urinary antigen negative. ?-Received IV ceftriaxone and azithromycin in house for possible community-acquired pneumonia. ?-Discharged on p.o. Augmentin for 3 more days and home azithromycin to complete treatment course. ?-Patient to continue his home ethambutol, Bactrim and Biktarvy. ?-Pulmonology to arrange outpatient follow-up for bronchoscopy ?-ID to arrange outpatient follow-up. ?-Renewed prescription for home Breo Ellipta and Incruse Ellipta ? ?Mass of upper lobe of right lung ?Treating as community-acquired pneumonia as above. ?-Pulmonology to arrange outpatient follow-up for bronchoscopy ? ?Tinea corporis and facialis ?Discharge on p.o. Diflucan 200 mg daily and nystatin cream twice daily for 2 weeks per ID recommendation ? ?Abnormal LFTs ?Improved. ?-Recheck in 1 to 2 weeks ? ?Emphysema, unspecified ([place]) ?Breo  Ellipta  and Incruse Ellipta ?Albuterol as needed ? ?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[place]) ?Continue home ethambutol ? ?Human immunodeficiency virus (HIV) disease ([place]) ?Continue home Biktarvy, Bactrim and ethambutol ?Outpatient follow-up with ID ?ID to follow-up on HIV labs. ? ?History of CVA with residual right hemiparesis ?Ambulatory referral to PT per PT recommendation ?Patient has walker at home. ? ? ? ?Vital signs ?Vitals:  ? 10/07/21 0510 10/07/21 0743 10/07/21 0813 10/07/21 1402  ?BP: (!) 138/106  104/81 (!) 120/92  ?Pulse: (!) 103  (!) 102 (!) 106  ?Temp:   97.8 ?F (36.6 ?C) (!) 97.5 ?F (36.4 ?C)  ?Resp: 18 (!) _0 ?Height:      ?Weight:      ?SpO2: 100% 99% 97% 99%  ?TempSrc:   Oral Oral  ?BMI (Calculated):      ?  ? ?Discharge exam ? ?GENERAL: Appears frail.  Nontoxic. ?HEENT: MMM.  Vision and hearing grossly intact.  ?NECK: Supple.  No apparent JVD.  ?RESP:  No IWOB.  Fair aeration bilaterally. ?CVS:  RRR. Heart sounds normal.  ?ABD/GI/GU: BS+. Abd soft, NTND.  ?MSK/EXT:  Moves extremities. No apparent deformity. No edema.  ?SKIN: no apparent skin lesion or wound ?NEURO: Awake and alert. Oriented appropriately.  No apparent focal neuro deficit other than chronic right hemiparesis with more pronounced weakness in RUE.  ?PSYCH: Calm. Normal affect.  ? ?Discharge Instructions ? ?Allergies as of 10/07/2021   ? ?   Reactions  ? Lidocaine Other (See Comments)  ? Large bruise at site of patch placement  ? ?  ? ?  ?Medication List  ?  ? ?STOP taking these medications   ? ?diclofenac Sodium 1 % Gel ?Commonly known as: VOLTAREN ?  ? ?  ? ?TAKE these medications   ? ?acetaminophen 325 MG tablet ?Commonly known as: TYLENOL ?Take 1-2 tablets (325-650 mg total) by mouth every 4 (four) hours as needed for mild pain. ?  ?amoxicillin-clavulanate 875-125 MG tablet ?Commonly known as: AUGMENTIN ?Take 1 tablet by mouth every 12 hours for 6 doses. ?  ?Aspirin Low Dose 81 MG chewable tablet ?Generic  drug: aspirin ?Chew 1 tablet (81 mg total) by mouth daily. ?  ?azithromycin 500 MG tablet ?Commonly known as: ZITHROMAX ?Take 1 tablet by mouth at bedtime. ?  ?bictegravir-emtricitabine-tenofovir AF 50-200-25 MG Tabs tablet ?Commonly known as: BIKTARVY ?Take 1 tablet by mouth daily. ?What changed: when to take this ?  ?CertaVite/Antioxidants Tabs ?Take 1 tablet by mouth daily. ?  ?ethambutol 400 MG tablet ?Commonly known as: MYAMBUTOL ?Take 2 tablets by mouth daily. ?What changed: when to take this ?  ?fluconazole 200 MG tablet ?Commonly known as: DIFLUCAN ?Take 1 tablet (200 mg total) by mouth daily for 13 days. ?Start taking on: October 08, 2021 ?  ?fluticasone furoate-vilanterol 100-25 MCG/ACT Aepb ?Commonly known as: Breo Ellipta ?Inhale 1 puff into the lungs daily. ?  ?guaiFENesin 600 MG 12 hr tablet ?Commonly known as: [place] ?Take 1 tablet (600 mg total) by mouth 2 (two) times daily for 5 days. ?  ?loratadine 10 MG tablet ?Commonly known as: CLARITIN ?Take 1 tablet (10 mg total) by mouth daily. ?What changed: when to take this ?  ?megestrol 40 MG/ML suspension ?Commonly known as: MEGACE ?Take 10 mLs (400 mg total) by mouth daily. ?What changed: when to take this ?  ?metoprolol succinate 50 MG 24 hr tablet ?Commonly known as: TOPROL-XL ?Take 1 tablet by mouth every morning. Take with or immediately following a meal.  NEEDS PASS ?What changed:  ?when to take this ?additional instructions ?  ?mirtazapine 15 MG tablet ?Commonly known as: Remeron ?Take 1 tablet (15 mg total) by mouth at bedtime. ?  ?nystatin cream ?Commonly known as: MYCOSTATIN ?Apply 1 application. topically 2 (two) times daily. ?  ?Oxycodone HCl 10 MG Tabs ?Take 0.5-1 tablets (5-10 mg total) by mouth every 6 (six) hours as needed for severe pain. ?  ?pantoprazole 40 MG tablet ?Commonly known as: PROTONIX ?Take 1 tablet (40 mg total) by mouth daily. ?  ?polyethylene glycol 17 g packet ?Commonly known as: MIRALAX / GLYCOLAX ?Take 17 g by mouth daily  as needed for mild constipation or moderate constipation. ?  ?QUEtiapine 200 MG tablet ?Commonly known as: SEROquel ?Take 1 tablet (200 mg total) by mouth at bedtime. ?  ?rosuvastatin 5 MG tablet ?Commo

## 2021-10-07 NOTE — Assessment & Plan Note (Signed)
Breo Ellipta and Incruse Ellipta ?Albuterol as needed ?

## 2021-10-07 NOTE — Progress Notes (Signed)
? ?  NAME:  Kurt Foley, MRN:  [id number], DOB:  08/01/1975, LOS: 0 ?ADMISSION DATE:  10/06/2021, CONSULTATION DATE:  10/06/2021 ?REFERRING MD:  Dr. Marylyn Ishihara, CHIEF COMPLAINT:  dyspnea with cough  ? ?History of Present Illness:  ?Kurt Foley is a 46yo male with a PMH significant for HIV/AIDS, PML, disseminated MAC, prior CVA with right residual weakness, and depression who presented to the [facility] ED 4/27 for complaints of dyspnea. Patient states that dyspnea started one week prior to admission and is associated with dry cough and subjective fever.  ? ?On ED arrival patient was seen  hemodynamically stable with mild tachycardia and tachypnea.  Lab work was significant for CO2 16, mildly elevated LFTs, and hgb 10.7. CTA chest was obtained and was negative for acute PE but revealed progressive mass-like peripheral RUL opacity concerning for unresolved pneumonia or malignancy. Given CT images pulmonary was consulted for assistance in management.  ? ?Pertinent  Medical History  ?HIV/AIDS, PML, disseminated MAC, prior CVA with right residual weakness, and depression  ? ?Significant Hospital Events: ?Including procedures, antibiotic start and stop dates in addition to other pertinent events   ?4/27 Presented with persistent dyspnea, CT concerning for RUL unresolved pneumonia vs malignancy   ?4/28 No acute issues overnight states he feels well this am ? ?Interim History / Subjective:  ?Seen sitting up on side of bed with no acute complaints ? ?Objective   ?Blood pressure 104/81, pulse (!) 102, temperature 97.8 ?F (36.6 ?C), temperature source Oral, resp. rate 20, height _0  (1.651 m), weight 63.5 kg, SpO2 97 %. ?   ?   ? ?Intake/Output Summary (Last 24 hours) at 10/07/2021 1058 ?Last data filed at 10/07/2021 3748 ?Gross per 24 hour  ?Intake 1524.67 ml  ?Output 850 ml  ?Net 674.67 ml  ? ?Filed Weights  ? 10/06/21 0245  ?Weight: 63.5 kg  ? ? ?Examination: ?General: Acute on chronic ill-appearing adult male sitting up on side  of bed in no acute distress ?HEENT: [place]/AT, MM pink/moist, PERRL,  ?Neuro: Alert and oriented x3, nonfocal ?CV: s1s2 regular rate and rhythm, no murmur, rubs, or gallops,  ?PULM: Clear to auscultation bilaterally, no increased work of breathing, no added breath sounds ?GI: soft, bowel sounds active in all 4 quadrants, non-tender, non-distended, tolerating oral diet ?Extremities: warm/dry, no edema  ?Skin: no rashes or lesions ? ? ?Resolved Hospital Problem list   ? ? ?Assessment & Plan:  ?Right upper lobe opacity  ?-CTA chest was obtained and was negative for acute PE but revealed progressive mass-like peripheral RUL opacity concerning for unresolved pneumonia or malignancy. ?Hx of HIV/AIDS  ?Hx of prior tobacco use  ?Disseminated MAC ?P: ?Infectious disease following, appreciate assistance ?Continue Biktarvy, prophylactic Bactrim, azithromycin + Ethambutol ?Follow respiratory culture ?Mobilize as able ?Encourage pulmonary hygiene ?Ensure adequate pain control ?Coordinating with pulmonary team to determine timing of bronchoscopy with possible biopsies ? ?Best Practice (right click and "Reselect all SmartList Selections" daily)  ? ?Per primary  ?  ?Critical care time: NA  ?Theodosia Bahena D. Harris, NP-C ?[facility] ?Personal contact information can be found on Amion  ?10/07/2021, 10:58 AM ? ? ? ? ? ? ?

## 2021-10-07 NOTE — Assessment & Plan Note (Signed)
Treating as community-acquired pneumonia as above. ?-Pulmonology to arrange outpatient follow-up for bronchoscopy ?

## 2021-10-09 LAB — CULTURE, RESPIRATORY W GRAM STAIN
Culture: NORMAL
Gram Stain: NONE SEEN

## 2021-10-10 ENCOUNTER — Telehealth: Payer: Self-pay

## 2021-10-10 LAB — HELPER T-LYMPH-CD4 (ARMC ONLY)
% CD 4 Pos. Lymph.: 6.1 % — ABNORMAL LOW (ref 30.8–58.5)
Absolute CD 4 Helper: 18 /uL — ABNORMAL LOW (ref 359–1519)
Basophils Absolute: 0 10*3/uL (ref 0.0–0.2)
Basos: 0 %
EOS (ABSOLUTE): 0 10*3/uL (ref 0.0–0.4)
Eos: 0 %
Hematocrit: 35.3 % — ABNORMAL LOW (ref 37.5–51.0)
Hemoglobin: 10.9 g/dL — ABNORMAL LOW (ref 13.0–17.7)
Immature Grans (Abs): 0 10*3/uL (ref 0.0–0.1)
Immature Granulocytes: 1 %
Lymphocytes Absolute: 0.3 10*3/uL — ABNORMAL LOW (ref 0.7–3.1)
Lymphs: 7 %
MCH: 27.9 pg (ref 26.6–33.0)
MCHC: 30.9 g/dL — ABNORMAL LOW (ref 31.5–35.7)
MCV: 91 fL (ref 79–97)
Monocytes Absolute: 0.1 10*3/uL (ref 0.1–0.9)
Monocytes: 2 %
Neutrophils Absolute: 3.7 10*3/uL (ref 1.4–7.0)
Neutrophils: 90 %
Platelets: 300 10*3/uL (ref 150–450)
RBC: 3.9 x10E6/uL — ABNORMAL LOW (ref 4.14–5.80)
RDW: 17.6 % — ABNORMAL HIGH (ref 11.6–15.4)
WBC: 4.1 10*3/uL (ref 3.4–10.8)

## 2021-10-10 NOTE — Telephone Encounter (Signed)
Transition Care Management Follow-up Telephone Call ?Date of discharge and from where: 10/07/2021, [facility]  ?How have you been since you were released from the hospital? He stated that he is feeling better.  ?Any questions or concerns? No- he said he has been approved for disability and is waiting for confirmation of approval for Medicaid.  ? ?Items Reviewed: ?Did the pt receive and understand the discharge instructions provided? Yes   ?Medications obtained and verified?  He said he has all of his medications except the nystatin cream and he did not have any questions about the med regime.  ?Other? No  ?Any new allergies since your discharge? No  ?Dietary orders reviewed? No ?Do you have support at home? Yes  - family ? ?Home Care and Equipment/Supplies: ?Were home health services ordered? no ?If so, what is the name of the agency? N/a  ?Has the agency set up a time to come to the patient's home? not applicable ?Were any new equipment or medical supplies ordered?  No ?What is the name of the medical supply agency? N/a ?Were you able to get the supplies/equipment? not applicable ?Do you have any questions related to the use of the equipment or supplies? No ? ?He has been referred to outpatient P.  ? ?Functional Questionnaire: (I = Independent and D = Dependent) ?ADLs: he uses a walker with ambulation.  His cousin assists with ADLs as needed.  ? ? ?Follow up appointments reviewed: ? ?PCP Hospital f/u appt confirmed?  Scheduled to see Bertram Denver, NP -11/18/2021.  ?Specialist Hospital f/u appt confirmed? Yes  Scheduled to see [facility]- 10/19/2021, Butch Penny, RPH - 10/13/2021.  ?Are transportation arrangements needed? No  ?If their condition worsens, is the pt aware to call PCP or go to the Emergency Dept.? Yes ?Was the patient provided with contact information for the PCP's office or ED? Yes ?Was to pt encouraged to call back with questions or concerns? Yes ? ?

## 2021-10-13 ENCOUNTER — Ambulatory Visit: Payer: Medicaid Other | Attending: Nurse Practitioner | Admitting: Pharmacist

## 2021-10-13 ENCOUNTER — Encounter: Payer: Self-pay | Admitting: Pharmacist

## 2021-10-13 VITALS — BP 109/77 | HR 94

## 2021-10-13 DIAGNOSIS — I10 Essential (primary) hypertension: Secondary | ICD-10-CM

## 2021-10-13 LAB — CULTURE, BLOOD (ROUTINE X 2)
Culture: NO GROWTH
Culture: NO GROWTH
Special Requests: ADEQUATE
Special Requests: ADEQUATE

## 2021-10-13 NOTE — Telephone Encounter (Signed)
I sent this message 6 days ago. Patient has been scheduled for follow up visit. ? ?Please schedule patient for follow up in 2-3 weeks with Dr. Tonia Brooms or Dr. Delton Coombes for RUL mass and discussion of possible nav bronch for biopsies. ? ?Thanks, ?JD ?

## 2021-10-13 NOTE — Telephone Encounter (Signed)
Spoke to patient and scheduled OV for 10/31/2021 at 2:00 with Dr. Tonia Brooms. ?Patient is aware and voiced his understanding.  ?Nothing further needed.  ? ?Routing to Dr. Francine Graven as an Lorain Childes.  ?

## 2021-10-13 NOTE — Progress Notes (Signed)
? ?S:    ? ?Kurt Foley is a 46 y.o. male who presents for hypertension evaluation, education, and management. PMH is significant for CVA, HIV, HTN, HLD, PML, MAI infection. Patient was referred and last seen by Primary Care Provider, Bertram Denver, NP, on 08/17/21 to establish care. Pt saw [place] on 09/12/2021. His DBP was above goal but he had not taken his BP medication that day.  ? ?In the interim, pt was hospitalized 4/27-4/28 with mild tachycardia and tachypnea. He was admitted for concerns of a mass-like peripheral RUL opacity concerning for unresolved pneumonia or malignancy. ID and Pulm consulted. He was treated for CAP and was scheduled for a navigational bronchoscopy outpatient after antibiotics. Of note, he is HIV + and home ethambutol, Bactrim, and Biktarvy were continued upon discharge. BP was midly elevated throughout hospitalization. He has outpatient follow-ups scheduled with OP rehab, ID, and with his PCP.  ? ?Today, patient arrives in good spirits and presents without assistance and accompanied by his mother. Denies dizziness, blurred vision, swelling.  Reports feeling better since discharge.  ? ?Family/Social history:  ?-DM, HTN in mother ?-Hx tobacco use ? ?Medication adherence reported. No missed doses in the past week. Patient has not taken metoprolol today - normally takes it after breakfast and he hasn't eaten yet. Reports he did take it yesterday.  ? ?Current antihypertensives include: metoprolol succinate 50 mg daily ? ?Antihypertensives tried in the past include: carvedilol ? ?Reported home BP readings: does not check at home ? ?Patient reported dietary habits: Reports he is on a "regular diet." Does not try to avoid salt.  ? ?Patient-reported exercise habits: Tries to exercise his leg. Does physical therapy once per week.  ? ?ASCVD risk factors include: HTN, HLD, HIV, hx CVA ? ?O:  ?Vitals:  ? 10/13/21 1056  ?BP: 109/77  ?Pulse: 94  ? ? ?Last 3 Office BP readings: ?BP Readings from Last 3  Encounters:  ?10/13/21 109/77  ?10/07/21 (!) 120/92  ?09/14/21 124/87  ? ? ?BMET ?   ?Component Value Date/Time  ? NA 138 10/07/2021 0458  ? NA 141 09/12/2021 1006  ? K 4.7 10/07/2021 0458  ? CL 115 (H) 10/07/2021 0458  ? CO2 16 (L) 10/07/2021 0458  ? GLUCOSE 123 (H) 10/07/2021 0458  ? BUN 18 10/07/2021 0458  ? BUN 14 09/12/2021 1006  ? CREATININE 0.82 10/07/2021 0458  ? CREATININE 1.00 07/08/2021 1215  ? CALCIUM 9.2 10/07/2021 0458  ? GFRNONAA >60 10/07/2021 0458  ? GFRNONAA 110 08/06/2019 1625  ? GFRAA 127 08/06/2019 1625  ? ? ?Renal function: ?Estimated Creatinine Clearance: 96.9 mL/min (by C-G formula based on SCr of 0.82 mg/dL). ? ?Clinical ASCVD: Yes  ?The ASCVD Risk score (Arnett DK, et al., 2019) failed to calculate for the following reasons: ?  The patient has a prior MI or stroke diagnosis ? ?A/P: ?Hypertension diagnosed currently at goal on current medications. BP goal < 130/80 mmHg. Medication adherence appears appropriate.  ?-Continue metoprolol succinate 50 mg daily.  ?-F/u labs ordered - none ?-Counseled on lifestyle modifications for blood pressure control including reduced dietary sodium, increased exercise, adequate sleep. ?-Encouraged patient to check BP at home and bring log of readings to next visit. Counseled on proper use of home BP cuff.  ? ?Results reviewed and written information provided. Patient verbalized understanding of treatment plan. Total time in face-to-face counseling 30 minutes.  ? ?F/u clinic visit in 4 weeks with [facility]. Next PCP visit 11/18/21. ? ?Georgiana Shore Ausdall,  PharmD, BCACP, CPP ?Clinical Pharmacist ?[facility] ?[phone] ? ?

## 2021-10-17 NOTE — Therapy (Signed)
?OUTPATIENT PHYSICAL THERAPY ORTHO EVALUATION ? ? ?Patient Name: Kurt Foley ?MRN: [id number] ?DOB:[birth year], 46 y.o., male ?Today's Date: 10/18/2021 ? ? PT End of Session - 10/18/21 1302   ? ? Visit Number 1   ? Number of Visits 16   ? Date for PT Re-Evaluation 12/13/21   ? Authorization Type [employer] MCD approved for [employer] and has disability   ? Authorization Time Period 10/18/21 to 12/13/21   ? Authorization - Visit Number 1   ? Authorization - Number of Visits 16   ? Progress Note Due on Visit 10   ? PT Start Time 1303   ? PT Stop Time 1350   ? PT Time Calculation (min) 47 min   ? Equipment Utilized During Treatment --   ? Activity Tolerance Patient tolerated treatment well   ? Behavior During Therapy [facility] for tasks assessed/performed   ? ?  ?  ? ?  ? ? ?Past Medical History:  ?Diagnosis Date  ? Depression   ? HIV infection ([place])   ? ?History reviewed. No pertinent surgical history. ?Patient Active Problem List  ? Diagnosis Date Noted  ? Tinea corporis and facialis 10/07/2021  ? Acute respiratory infection   ? Tinea barbae   ? Dyspnea 10/06/2021  ? Mass of upper lobe of right lung 10/06/2021  ? Cough 09/14/2021  ? Insomnia 08/08/2021  ? Pancytopenia ([place]) 08/08/2021  ? Abnormal LFTs 08/08/2021  ? Neutropenia ([place]) 08/08/2021  ? Sinus tachycardia   ? Encephalitis 07/22/2021  ? Debility 07/20/2021  ? Hypokalemia 07/19/2021  ? Hyponatremia 07/18/2021  ? Malnutrition of moderate degree 07/15/2021  ? FTT (failure to thrive) in adult 07/13/2021  ? 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 CVA (cerebral vascular accident) ([place]) 07/08/2021  ? History of CVA with residual right hemiparesis 05/25/2021  ? Fever 03/25/2021  ? Rash of mouth present on examination 03/25/2021  ? 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 Need for prophylactic vaccination and inoculation against smallpox 03/25/2021  ? Numbness and tingling of right arm 03/24/2021  ? AIDS (acquired immune  deficiency syndrome) ([place]) 03/05/2021  ? Emphysema, unspecified ([place]) 03/05/2021  ? Cocaine-induced mood disorder ([place])   ? Tinea pedis 08/06/2019  ? At risk for opportunistic infections 07/14/2019  ? Generalized weakness 06/02/2019  ? Cocaine abuse ([place]) 11/25/2018  ? Major depressive disorder, recurrent severe without psychotic features ([place]) 11/25/2018  ? Substance induced mood disorder ([place]) 09/16/2015  ? Cocaine use disorder, severe, dependence ([place])   ? Cannabis use disorder, severe, dependence ([place]) 04/09/2015  ? Severe recurrent major depression without psychotic features ([place]) 04/08/2015  ? Alcohol abuse 04/08/2015  ? Screening examination for venereal disease 03/25/2015  ? Encounter for long-term (current) use of medications 03/25/2015  ? Tobacco abuse   ? Visual disturbance 11/18/2013  ? Human immunodeficiency virus (HIV) disease ([place]) 10/30/2013  ? Atopic dermatitis 10/30/2013  ? ? ?PCP: Gildardo Pounds, NP ?REFERRING PROVIDER: Mercy Riding, MD ? ?ONSET DATE: 07/08/21 ? ?REFERRING DIAG: R26.81 (ICD-10-CM) - Unsteady gait ? ?THERAPY DIAG:  ?Muscle weakness (generalized) ? ?Other abnormalities of gait and mobility ? ?Unsteadiness on feet ? ?SUBJECTIVE:  ?                                                                                                                                                                                           ? ?  SUBJECTIVE STATEMENT: ?Patient had a CVA on 07/08/21 and had PT in the hospital last week due dyspnea and generalized weakness.  He denies having PT post stroke except in the hospital. He now walks with a [facility] and has right sided hemiparesis. I want to work on my arm.  ?Pt accompanied by: self ? ?PERTINENT HISTORY: CVA (right hemiparesis); HIV, emphysema, CAD ? ?PAIN:  ?Are you having pain? No ? ?PRECAUTIONS: Other: HIV ? ?WEIGHT BEARING RESTRICTIONS No ? ?FALLS: Has patient fallen in last 6 months? No ? ?LIVING ENVIRONMENT: ?Lives with: lives with their family ?Lives  in: House/apartment ?Stairs: No ?Has following equipment at home: Quad cane large base ? ?PLOF: Independent ? ?PATIENT GOALS He wants to get better use of his arm and leg.  ? ?OBJECTIVE:  ? ?DIAGNOSTIC FINDINGS: MRI LUMBAR: 1. No spinal canal stenosis or neural foraminal narrowing. No ?evidence of cauda equina syndrome. ?2. No abnormal osseous, conus, or cauda equina enhancement. ?3. Transitional anatomy, with sacralization of L5. Please correlate ?with imaging if any intervention is planned. ? ?COGNITION: ?Overall cognitive status: Within functional limits for tasks assessed ?  ?SENSATION: ?Light touch: Impaired  ? ?LE ROM:   bil hips and knees WFL ? ? ?MMT:   ? ?MMT Right ?10/18/2021 Left ?10/18/2021  ?Hip flexion 4- 4+  ?Hip extension    ?Hip abduction 4 5  ?Hip adduction 4+ 5  ?Hip internal rotation 0 5  ?Hip external rotation 3+ 4+  ?Knee flexion 2 4+  ?Knee extension 4 5  ?Ankle dorsiflexion 0 4+  ?Ankle plantarflexion    ?Ankle inversion    ?Ankle eversion    ?(Blank rows = not tested) ? ? ?TRANSFERS: ?Assistive device utilized: Quad cane large base  ?Sit to stand: Modified independence ?Stand to sit: Complete Independence ?Chair to chair: Modified independence ? ?GAIT: ?Gait pattern: step through pattern, decreased step length- Left, decreased stance time- Right, and decreased stride length ?Distance walked: 20 ?Assistive device utilized: Quad cane large base ?Level of assistance: Modified independence ?Comments: good management of LBQC around obstacles ? ?FUNCTIONAL TESTs:  ?5 times sit to stand: 25.11 sec ?Timed up and go (TUG): 38.93 sec ? : 262 feet/ 87.3 M no rests using LBQC; no dyspnea noted at end of test.  ? ?TODAY'S TREATMENT:  ? ? ? ?PATIENT EDUCATION: ?Education details: sit to stand; advised walking program at home; POC - plan to transfer to [facility] for OT and PT combined;  ?Person educated: Patient ?Education method: Explanation and Demonstration ?Education comprehension: verbalized understanding and  returned demonstration ? ? ?HOME EXERCISE PROGRAM: ?Access Code: [email or web address] ?- Sit to Stand with Armchair  - 2-3 x daily - 7 x weekly - 1-2 sets - 5 reps ? ?Patient Education ?- walking program  ? ? ? ?GOALS: ?Goals reviewed with patient? Yes ? ?SHORT TERM GOALS: Target date: 11/01/2021 ? ?Ind with initial HEP ?Baseline:No HEP ?Goal status: INITIAL ? ?2.  Patient compliant with daily walking program ?Baseline: no walking program ?Goal status: INITIAL ? ? ?LONG TERM GOALS: Target date: 12/13/2021 ? ?Ind with advanced HEP ?Baseline: No HEP ?Goal status: INITIAL ? ?2.  Improved 5x sit to stand to <= 11 sec demonstrating increased strength and decreased risk for falls. ?Baseline: 25.11 sec ?Goal status: INITIAL ? ?3.  Improved TUG to <= 25 sec to decrease fall risk ?Baseline: 38.93 sec ?Goal status: INITIAL ? ?4.  Improved to >= 150M showing increased walking endurance for community ambulation ?Baseline: 87.3 M ?Goal status:  INITIAL ? ?5.  Improved R hip flex/ABDADD by 1/2 to 1 full muscle grade to better assist with transfers and functional mobility.  ?Baseline: flex/ABDADD: 4-/4/4+ respectively ?Goal status: INITIAL ? ?6.  Improved R knee strength by /2 to 1 full muscle grade to better assist with transfers and functional mobility ?Baseline: Flex 2/5, ext 4/5 ?Goal status: INITIAL ? ?ASSESSMENT: ? ?CLINICAL IMPRESSION: ?Patient is a 46 y.o. male who was seen today for physical therapy evaluation and treatment for unsteady gait. He was recently seen in the ER for dyspnea and generalized weakness and referred to PT. He presents s/p CVA with right hemiparesis since Jan 2023. He wears an AFO and uses a LBQC. He has marked right sided weakness affecting sit to stand transfers and gait endurance. He denies falls but is a significant fall risk based on his TUG and 5x sit to stand times. He had some dyspnea with sit to stand and TUG, but tolerated 6MWT better. He reports that the walk from the lobby to the clinic is  about his limit. Additionally he has marked R UE deficits and would like to work on gaining function here as well. Patient would benefit from skilled PT to address these deficits.  ? ? ?OBJECTIVE IMPAIRME

## 2021-10-18 ENCOUNTER — Other Ambulatory Visit: Payer: Self-pay

## 2021-10-18 ENCOUNTER — Encounter: Payer: Self-pay | Admitting: Physical Therapy

## 2021-10-18 ENCOUNTER — Ambulatory Visit: Payer: Medicaid Other | Attending: Student | Admitting: Physical Therapy

## 2021-10-18 ENCOUNTER — Other Ambulatory Visit: Payer: Self-pay | Admitting: Nurse Practitioner

## 2021-10-18 DIAGNOSIS — M6281 Muscle weakness (generalized): Secondary | ICD-10-CM | POA: Diagnosis present

## 2021-10-18 DIAGNOSIS — R2681 Unsteadiness on feet: Secondary | ICD-10-CM | POA: Diagnosis present

## 2021-10-18 DIAGNOSIS — R41840 Attention and concentration deficit: Secondary | ICD-10-CM | POA: Diagnosis present

## 2021-10-18 DIAGNOSIS — I639 Cerebral infarction, unspecified: Secondary | ICD-10-CM

## 2021-10-18 DIAGNOSIS — I69351 Hemiplegia and hemiparesis following cerebral infarction affecting right dominant side: Secondary | ICD-10-CM | POA: Insufficient documentation

## 2021-10-18 DIAGNOSIS — R208 Other disturbances of skin sensation: Secondary | ICD-10-CM | POA: Diagnosis present

## 2021-10-18 DIAGNOSIS — R278 Other lack of coordination: Secondary | ICD-10-CM | POA: Diagnosis present

## 2021-10-18 DIAGNOSIS — R2689 Other abnormalities of gait and mobility: Secondary | ICD-10-CM | POA: Diagnosis present

## 2021-10-19 ENCOUNTER — Encounter: Payer: Self-pay | Admitting: Internal Medicine

## 2021-10-19 ENCOUNTER — Ambulatory Visit (INDEPENDENT_AMBULATORY_CARE_PROVIDER_SITE_OTHER): Payer: Medicaid Other | Admitting: Internal Medicine

## 2021-10-19 ENCOUNTER — Other Ambulatory Visit: Payer: Self-pay

## 2021-10-19 VITALS — BP 113/81 | HR 98 | Temp 97.8°F | Wt 151.2 lb

## 2021-10-19 DIAGNOSIS — Z8673 Personal history of transient ischemic attack (TIA), and cerebral infarction without residual deficits: Secondary | ICD-10-CM | POA: Diagnosis not present

## 2021-10-19 DIAGNOSIS — B20 Human immunodeficiency virus [HIV] disease: Secondary | ICD-10-CM | POA: Diagnosis not present

## 2021-10-19 DIAGNOSIS — Z9189 Other specified personal risk factors, not elsewhere classified: Secondary | ICD-10-CM | POA: Diagnosis not present

## 2021-10-19 DIAGNOSIS — A310 Pulmonary mycobacterial infection: Secondary | ICD-10-CM | POA: Diagnosis not present

## 2021-10-19 DIAGNOSIS — R918 Other nonspecific abnormal finding of lung field: Secondary | ICD-10-CM

## 2021-10-19 MED ORDER — FLUCONAZOLE 200 MG PO TABS: 200.0000 mg | ORAL_TABLET | Freq: Every day | ORAL | 0 refills | Status: DC

## 2021-10-19 NOTE — Assessment & Plan Note (Addendum)
He has had continued slow improvement of his immune system and continuing with his medication.  Will recheck his labs today.   ? ?PCA services request filled out during this visit. ? ?I have personally spent 40 minutes involved in face-to-face and non-face-to-face activities for this patient on the day of the visit plus an additional 20 minutes including filling out for PCA services and extensive review of recent history. Professional time spent includes the following activities: Preparing to see the patient (review of tests), Obtaining and/or reviewing separately obtained history (admission/discharge record), Performing a medically appropriate examination and/or evaluation , Ordering medications/tests/procedures, referring and communicating with other health care professionals, Documenting clinical information in the EMR, Independently interpreting results (not separately reported), Communicating results to the patient/family/caregiver, Counseling and educating the patient/family/caregiver and Care coordination (not separately reported).  ? ? ?

## 2021-10-19 NOTE — Progress Notes (Signed)
? ?  Subjective:  ? ? Patient ID: Kurt Foley, male    DOB: 01-13-1976, 46 y.o.   MRN: [id number] ? ?HPI ?He is here for hsfu and follow up of HIV ?He was again hospitalized last month for shortness of breath and weakness.  He has PML, disseminated MAI infection and a prior CVA with right-sided weakness.  CT scan of his chest noted a pulmonary mass, not susspected to be an opacity.  Pulmonary with outpatient follow up for consideration of a navigational biopsy.  He is feeling better today.  Records from his recent hospitalization completely reviewed.   ? ? ?Review of Systems  ?Constitutional:  Positive for fatigue.  ?Gastrointestinal:  Negative for diarrhea and nausea.  ?Skin:  Negative for rash.  ? ?   ?Objective:  ? Physical Exam ?Eyes:  ?   General: No scleral icterus. ?Pulmonary:  ?   Effort: Pulmonary effort is normal. No respiratory distress.  ?   Breath sounds: No wheezing.  ?Skin: ?   Comments: Still some discoloration on his face  ?Neurological:  ?   Mental Status: He is alert.  ? ? ? ? ? ?   ?Assessment & Plan:  ? ? ?

## 2021-10-19 NOTE — Addendum Note (Signed)
Addended by: Gearlean Alf on: 10/19/2021 05:25 PM ? ? Modules accepted: Orders ? ?

## 2021-10-19 NOTE — Assessment & Plan Note (Signed)
I encouraged him to continue to go to rehab ?

## 2021-10-19 NOTE — Assessment & Plan Note (Signed)
This is a new concern during this hospitalization and has follow up with pulmonary for consideration of biopsy.  Follow up information provided for him.  ? ?

## 2021-10-19 NOTE — Assessment & Plan Note (Signed)
He will continue with his MAI treatment.  ?

## 2021-10-20 ENCOUNTER — Other Ambulatory Visit: Payer: Self-pay | Admitting: Nurse Practitioner

## 2021-10-20 ENCOUNTER — Ambulatory Visit: Payer: Medicaid Other | Admitting: Physical Therapy

## 2021-10-20 DIAGNOSIS — I639 Cerebral infarction, unspecified: Secondary | ICD-10-CM

## 2021-10-20 LAB — T-HELPER CELL (CD4) - (RCID CLINIC ONLY)
CD4 % Helper T Cell: 8 % — ABNORMAL LOW (ref 33–65)
CD4 T Cell Abs: 46 /uL — ABNORMAL LOW (ref 400–1790)

## 2021-10-21 LAB — HIV-1 RNA QUANT-NO REFLEX-BLD
HIV 1 RNA Quant: 21 copies/mL — ABNORMAL HIGH
HIV-1 RNA Quant, Log: 1.32 Log copies/mL — ABNORMAL HIGH

## 2021-10-24 ENCOUNTER — Telehealth: Payer: Self-pay

## 2021-10-24 NOTE — Telephone Encounter (Signed)
Forms faxed to [facility], made patient aware.  ? ?Sandie Ano, RN ? ?

## 2021-10-25 ENCOUNTER — Encounter: Payer: Self-pay | Admitting: Physical Therapy

## 2021-10-26 ENCOUNTER — Ambulatory Visit: Payer: Medicaid Other | Admitting: Physical Therapy

## 2021-10-26 ENCOUNTER — Encounter: Payer: Self-pay | Admitting: Physical Therapy

## 2021-10-26 DIAGNOSIS — M6281 Muscle weakness (generalized): Secondary | ICD-10-CM | POA: Diagnosis not present

## 2021-10-26 DIAGNOSIS — R2681 Unsteadiness on feet: Secondary | ICD-10-CM

## 2021-10-26 DIAGNOSIS — R2689 Other abnormalities of gait and mobility: Secondary | ICD-10-CM

## 2021-10-26 NOTE — Therapy (Signed)
?OUTPATIENT PHYSICAL THERAPY ORTHO TREATMENT ? ? ?Patient Name: Kurt Foley ?MRN: [id number] ?DOB:August 07, 1975, 46 y.o., male ?Today's Date: 10/26/2021 ? ? PT End of Session - 10/26/21 1401   ? ? Visit Number 2   ? Number of Visits 16   ? Date for PT Re-Evaluation 12/13/21   ? Authorization Type [employer] M[employer] approved for [facility] and has disability   ? Authorization Time Period 10/18/21 to 12/13/21   ? Authorization - Visit Number 2   ? Authorization - Number of Visits 16   ? Progress Note Due on Visit 10   ? PT Start Time 1316   ? PT Stop Time [id number]   ? PT Time Calculation (min) 41 min   ? Equipment Utilized During Treatment Gait belt   ? Activity Tolerance Patient tolerated treatment well   ? Behavior During Therapy [facility] for tasks assessed/performed   ? ?  ?  ? ?  ? ? ? ?Past Medical History:  ?Diagnosis Date  ? Depression   ? HIV infection ([place])   ? ?History reviewed. No pertinent surgical history. ?Patient Active Problem List  ? Diagnosis Date Noted  ? Tinea corporis and facialis 10/07/2021  ? Acute respiratory infection   ? Tinea barbae   ? Dyspnea 10/06/2021  ? Mass of upper lobe of right lung 10/06/2021  ? Cough 09/14/2021  ? Insomnia 08/08/2021  ? Pancytopenia ([place]) 08/08/2021  ? Abnormal LFTs 08/08/2021  ? Neutropenia ([place]) 08/08/2021  ? Sinus tachycardia   ? Encephalitis 07/22/2021  ? Debility 07/20/2021  ? Hypokalemia 07/19/2021  ? Hyponatremia 07/18/2021  ? Malnutrition of moderate degree 07/15/2021  ? FTT (failure to thrive) in adult 07/13/2021  ? 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 CVA (cerebral vascular accident) ([place]) 07/08/2021  ? History of CVA with residual right hemiparesis 05/25/2021  ? Rash of mouth present on examination 03/25/2021  ? 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 Need for prophylactic vaccination and inoculation against smallpox 03/25/2021  ? Numbness and tingling of right arm 03/24/2021  ? AIDS (acquired immune deficiency  syndrome) ([place]) 03/05/2021  ? Emphysema, unspecified ([place]) 03/05/2021  ? Cocaine-induced mood disorder ([place])   ? Tinea pedis 08/06/2019  ? At risk for opportunistic infections 07/14/2019  ? Generalized weakness 06/02/2019  ? Cocaine abuse ([place]) 11/25/2018  ? Major depressive disorder, recurrent severe without psychotic features ([place]) 11/25/2018  ? Substance induced mood disorder ([place]) 09/16/2015  ? Cocaine use disorder, severe, dependence ([place])   ? Cannabis use disorder, severe, dependence ([place]) 04/09/2015  ? Severe recurrent major depression without psychotic features ([place]) 04/08/2015  ? Alcohol abuse 04/08/2015  ? Screening examination for venereal disease 03/25/2015  ? Encounter for long-term (current) use of medications 03/25/2015  ? Tobacco abuse   ? Visual disturbance 11/18/2013  ? Human immunodeficiency virus (HIV) disease ([place]) 10/30/2013  ? Atopic dermatitis 10/30/2013  ? ? ?PCP: Gildardo Pounds, NP ?REFERRING PROVIDER: Mercy Riding, MD ? ?ONSET DATE: 07/08/21 ? ?REFERRING DIAG: R26.81 (ICD-10-CM) - Unsteady gait ? ?THERAPY DIAG:  ?Muscle weakness (generalized) ? ?Other abnormalities of gait and mobility ? ?Unsteadiness on feet ? ?SUBJECTIVE:  ?                                                                                                                                                                                           ? ?  SUBJECTIVE STATEMENT:  ?I'm drained today, I didn't sleep well last night because of me. No pain. I'm up for anything, I will try today.  ? ?Pt accompanied by: self ? ?PERTINENT HISTORY: CVA (right hemiparesis); HIV, emphysema, CAD ? ?PAIN:  ?Are you having pain? No ? ?PRECAUTIONS: Other: HIV ? ?WEIGHT BEARING RESTRICTIONS No ? ?FALLS: Has patient fallen in last 6 months? No ? ?LIVING ENVIRONMENT: ?Lives with: lives with their family ?Lives in: House/apartment ?Stairs: No ?Has following equipment at home: Quad cane large base ? ?PLOF: Independent ? ?PATIENT GOALS He wants to get  better use of his arm and leg.  ? ?OBJECTIVE:  ? ? ? ? ?TODAY'S TREATMENT:  ? ?TREATMENT 10/26/21 ? ?WC propulsion x48ft with MinA from PT to steer R side of chair  ?Bed mobility MinA sit to supine, MinA supine to sit  ?             Transfers from chair to chair: min guard with hemiwalker  ?              Car transfer with min guard assist no device  ? ?              Seated ? ?             Lateral trunk bends for WB onto hemi UE (ModA from therapist to control motion and for good alignment/safety) 1x10  ? ?               ?               Supine ?               Bridges 2x10 ?               R LE adduction in hook laying 2x10  ?               Heel slides with pillow case on shoe to reduce friction 2x10 ?               Marches in supine R LE 2x10  ? ?              Standing ? ?              Sit to stands with ModA from PT for weight shift over RLE 1x5 ? ?                 ? ? ? ?PATIENT EDUCATION: ?Education details: exercise form and purpose, purpose of safety precautions during therapy session for sake of pt and PT especially while we are getting to know each other  ?Person educated: patient  ?Education method: explanation  ?Education comprehension:agreeable  ? ? ?HOME EXERCISE PROGRAM: ?Access Code: [email or web address] ?- Sit to Stand with Armchair  - 2-3 x daily - 7 x weekly - 1-2 sets - 5 reps ? ?Patient Education ?- walking program  ? ? ? ?GOALS: ?Goals reviewed with patient? Yes ? ?SHORT TERM GOALS: Target date: 11/01/2021 ? ?Ind with initial HEP ?Baseline:No HEP ?Goal status: INITIAL ? ?2.  Patient compliant with daily walking program ?Baseline: no walking program ?Goal status: INITIAL ? ? ?LONG TERM GOALS: Target date: 12/13/2021 ? ?Ind with advanced HEP ?Baseline: No HEP ?Goal status: INITIAL ? ?2.  Improved 5x sit to stand to <= 11 sec demonstrating increased strength and decreased risk for falls. ?Baseline: 25.11 sec ?Goal status: INITIAL ? ?3.  Improved TUG to <=  25 sec to decrease fall risk ?Baseline: 38.93  sec ?Goal status: INITIAL ? ?4.  Improved 6MWT to >= 150M showing increased walking endurance for community ambulation ?Baseline: 87.3 M ?Goal status: INITIAL ? ?5.  Improved R hip flex/ABDADD by 1/2 to 1 full muscle grade to better assist with transfers and functional mobility.  ?Baseline: flex/ABDADD: 4-/4/4+ respectively ?Goal status: INITIAL ? ?6.  Improved R knee strength by /2 to 1 full muscle grade to better assist with transfers and functional mobility ?Baseline: Flex 2/5, ext 4/5 ?Goal status: INITIAL ? ?ASSESSMENT: ? ?CLINICAL IMPRESSION: ?Reggie arrives today more tired than usual- tells me he is just very drained today but will do what he can. Due to increased fatigue today we focused on a lot of exercise in supine and sitting just to get his weaker side active and moving a bit more. Tolerated treatment session well and worked hard today. Did get a bit annoyed with therapist at [place] over guarding provided but did need cues for safety. Will continue efforts as able and tolerated.  ? ? ?OBJECTIVE IMPAIRMENTS Abnormal gait, cardiopulmonary status limiting activity, decreased balance, decreased endurance, decreased strength, and impaired UE functional use.  ? ?ACTIVITY LIMITATIONS community activity.  ? ?PERSONAL FACTORS Time since onset of injury/illness/exacerbation and 1-2 comorbidities: Depression, HIV  are also affecting patient's functional outcome.  ? ? ?REHAB POTENTIAL: Good ? ?CLINICAL DECISION MAKING: Evolving/moderate complexity ? ?EVALUATION COMPLEXITY: Low ? ?PLAN: ?PT FREQUENCY: 2x/week ? ?PT DURATION: 8 weeks ? ?PLANNED INTERVENTIONS: Therapeutic exercises, Therapeutic activity, Neuromuscular re-education, Balance training, Gait training, Patient/Family education, Stair training, Manual therapy, and Self Care ? ?PLAN FOR NEXT SESSION: LE strengthening, cardio, gait including steps, curbs. ? ?Ixchel Duck U PT, DPT, PN2  ? ?Supplemental Physical Therapist ?[place]  ? ? ? ? ? ? ? ? ? ?  ?

## 2021-10-26 NOTE — Telephone Encounter (Signed)
We received a fax back from [facility] stating the patient is enrolled in [employer]aged [employer] Plan and will need to contact his healthcare plan for PCS. ?I have informed the patient of this information as well and he will contact his insurance.  ?I will place the denial in scan as well.  ?Chanise Habeck T Briana Newman ? ?

## 2021-10-28 ENCOUNTER — Ambulatory Visit: Payer: Medicaid Other | Admitting: Physical Therapy

## 2021-10-28 ENCOUNTER — Encounter: Payer: Self-pay | Admitting: Physical Therapy

## 2021-10-28 ENCOUNTER — Ambulatory Visit: Payer: Medicaid Other | Admitting: Occupational Therapy

## 2021-10-28 DIAGNOSIS — R2689 Other abnormalities of gait and mobility: Secondary | ICD-10-CM

## 2021-10-28 DIAGNOSIS — M6281 Muscle weakness (generalized): Secondary | ICD-10-CM | POA: Diagnosis not present

## 2021-10-28 DIAGNOSIS — R2681 Unsteadiness on feet: Secondary | ICD-10-CM

## 2021-10-28 NOTE — Therapy (Signed)
OUTPATIENT PHYSICAL THERAPY ORTHO TREATMENT   Patient Name: Kurt Foley MRN: [id number] DOB:04/25/1976, 46 y.o., male Today's Date: 10/28/2021   PT End of Session - 10/28/21 1014     Visit Number 3    Number of Visits 16    Date for PT Re-Evaluation 12/13/21    Authorization Type [employer] approved for [facility] and has disability    Authorization Time Period 10/18/21 to 12/13/21    Authorization - Visit Number 3    Authorization - Number of Visits 16    Progress Note Due on Visit 10    PT Start Time 0933    PT Stop Time 1013    PT Time Calculation (min) 40 min    Activity Tolerance Patient tolerated treatment well    Behavior During Therapy [facility] for tasks assessed/performed               Past Medical History:  Diagnosis Date   Depression    HIV infection ([place])    History reviewed. No pertinent surgical history. 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   CVA (cerebral vascular accident) ([place]) 07/08/2021   History of CVA with residual right hemiparesis 05/25/2021   Rash of mouth present on examination 03/25/2021   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[place]) 03/25/2021   Need for prophylactic vaccination and inoculation against smallpox 03/25/2021   Numbness and tingling of right arm 03/24/2021   AIDS (acquired immune deficiency syndrome) ([place]) 03/05/2021   Emphysema, unspecified  ([place]) 03/05/2021   Cocaine-induced mood disorder ([place])    Tinea pedis 08/06/2019   At risk for opportunistic infections 07/14/2019   Generalized weakness 06/02/2019   Cocaine abuse ([place]) 11/25/2018   Major depressive disorder, recurrent severe without psychotic features ([place]) 11/25/2018   Substance induced mood disorder ([place]) 09/16/2015   Cocaine use disorder, severe, dependence ([place])    Cannabis use disorder, severe, dependence ([place]) 04/09/2015   Severe recurrent major depression without psychotic features ([place]) 04/08/2015   Alcohol abuse 04/08/2015   Screening examination for venereal disease 03/25/2015   Encounter for long-term (current) use of medications 03/25/2015   Tobacco abuse    Visual disturbance 11/18/2013   Human immunodeficiency virus (HIV) disease ([place]) 10/30/2013   Atopic dermatitis 10/30/2013    PCP: Gildardo Pounds, NP REFERRING PROVIDER: Mercy Riding, MD  ONSET DATE: 07/08/21  REFERRING DIAG: R26.81 (ICD-10-CM) - Unsteady gait  THERAPY DIAG:  Muscle weakness (generalized)  Other abnormalities of gait and mobility  Unsteadiness on feet  SUBJECTIVE:  SUBJECTIVE STATEMENT:  Feeling better today, much more energy. No pain. Up for whatever.   Pt accompanied by: self  PERTINENT HISTORY: CVA (right hemiparesis); HIV, emphysema, CAD  PAIN:  Are you having pain? No  PRECAUTIONS: Other: HIV  WEIGHT BEARING RESTRICTIONS No  FALLS: Has patient fallen in last 6 months? No  LIVING ENVIRONMENT: Lives with: lives with their family Lives in: House/apartment Stairs: No Has following equipment at home: Quad cane large base  PLOF: Independent  PATIENT GOALS He wants to get better use of his arm and leg.       TREATMENT 10/26/21  WC propulsion x48ft with MinA from  PT to steer R side of chair  Bed mobility MinA sit to supine, MinA supine to sit               Transfers from chair to chair: min guard with hemiwalker                Car transfer with min guard assist no device                 Seated               Lateral trunk bends for WB onto hemi UE (ModA from therapist to control motion and for good alignment/safety) 1x10                                 Supine                Bridges 2x10                R LE adduction in hook laying 2x10                 Heel slides with pillow case on shoe to reduce friction 2x10                Marches in supine R LE 2x10                 Standing                Sit to stands with ModA from PT for weight shift over RLE 1x5     TREATMENT 5/19  Standing  Gait training from waiting room to gym (approximately 60-9ft x2 in and out of clinic) hemiwalker S to min guard step to pattern  Stepping forward over TB with lunge forward onto RLE- Min-ModA for control/coordination RLE  Stepping forward over TB with lunge forward L LE- MinA to control hyperextension RLE in stance   Foot taps on 4 inch box first with RLE for motor control 1x10, then with L LE practicing stance in R LE while avoiding knee hyperextension 1x10   Sitting   Nustep L4 6 minutes BLEs only use of band to keep knees in good alignment                   LAQs RLE 1x10 2# 1x10  Seated marches 1x10 2# RLE     PATIENT EDUCATION: Education details: exercise form and purpose, good technique with activities to make sure we are loading RLE well and in good alignment Person educated: patient  Education method: explanation  Education comprehension:agreeable    HOME EXERCISE PROGRAM: Access Code: [email or web address] - Sit to Stand with Armchair  - 2-3 x daily - 7 x weekly - 1-2 sets - 5 reps  Patient Education - walking program     GOALS: Goals reviewed with patient? Yes  SHORT TERM GOALS: Target date: 11/01/2021  Ind with initial  HEP Baseline:No HEP Goal status: INITIAL  2.  Patient compliant with daily walking program Baseline: no walking program Goal status: INITIAL   LONG TERM GOALS: Target date: 12/13/2021  Ind with advanced HEP Baseline: No HEP Goal status: INITIAL  2.  Improved 5x sit to stand to <= 11 sec demonstrating increased strength and decreased risk for falls. Baseline: 25.11 sec Goal status: INITIAL  3.  Improved TUG to <= 25 sec to decrease fall risk Baseline: 38.93 sec Goal status: INITIAL  4.  Improved 6MWT to >= 150M showing increased walking endurance for community ambulation Baseline: 87.3 M Goal status: INITIAL  5.  Improved R hip flex/ABDADD by 1/2 to 1 full muscle grade to better assist with transfers and functional mobility.  Baseline: flex/ABDADD: 4-/4/4+ respectively Goal status: INITIAL  6.  Improved R knee strength by /2 to 1 full muscle grade to better assist with transfers and functional mobility Baseline: Flex 2/5, ext 4/5 Goal status: INITIAL  ASSESSMENT:  CLINICAL IMPRESSION: Reggie arrives today feeling better, did not need WC to get into clinic today. We were able to work on more advanced tasks in standing and focused on motor control/stepping and weight bearing tasks on R LE as well as on tasks in standing to work on reducing knee hyperextension. Also worked on Research scientist (life sciences) on [employer] as well as general strengthening for RLE. Did really well today, worked hard and very motivated!    OBJECTIVE IMPAIRMENTS Abnormal gait, cardiopulmonary status limiting activity, decreased balance, decreased endurance, decreased strength, and impaired UE functional use.   ACTIVITY LIMITATIONS community activity.   PERSONAL FACTORS Time since onset of injury/illness/exacerbation and 1-2 comorbidities: Depression, HIV  are also affecting patient's functional outcome.    REHAB POTENTIAL: Good  CLINICAL DECISION MAKING: Evolving/moderate complexity  EVALUATION COMPLEXITY:  Low  PLAN: PT FREQUENCY: 2x/week  PT DURATION: 8 weeks  PLANNED INTERVENTIONS: Therapeutic exercises, Therapeutic activity, Neuromuscular re-education, Balance training, Gait training, Patient/Family education, Stair training, Manual therapy, and Self Care  PLAN FOR NEXT SESSION: LE strengthening, cardio, gait including steps, curbs.  Ann Lions PT, DPT, PN2   Supplemental Physical Therapist [place]

## 2021-10-29 ENCOUNTER — Emergency Department (HOSPITAL_COMMUNITY): Payer: Medicaid Other

## 2021-10-29 ENCOUNTER — Inpatient Hospital Stay (HOSPITAL_COMMUNITY)
Admission: EM | Admit: 2021-10-29 | Discharge: 2021-11-02 | DRG: 175 | Disposition: A | Discharge status: Home / Self Care | Payer: Medicaid Other | Attending: Internal Medicine | Admitting: Internal Medicine

## 2021-10-29 DIAGNOSIS — Z20822 Contact with and (suspected) exposure to covid-19: Secondary | ICD-10-CM | POA: Diagnosis present

## 2021-10-29 DIAGNOSIS — Z8673 Personal history of transient ischemic attack (TIA), and cerebral infarction without residual deficits: Secondary | ICD-10-CM

## 2021-10-29 DIAGNOSIS — I82451 Acute embolism and thrombosis of right peroneal vein: Secondary | ICD-10-CM | POA: Diagnosis present

## 2021-10-29 DIAGNOSIS — R0600 Dyspnea, unspecified: Secondary | ICD-10-CM | POA: Diagnosis not present

## 2021-10-29 DIAGNOSIS — Z8701 Personal history of pneumonia (recurrent): Secondary | ICD-10-CM

## 2021-10-29 DIAGNOSIS — I2609 Other pulmonary embolism with acute cor pulmonale: Principal | ICD-10-CM | POA: Diagnosis present

## 2021-10-29 DIAGNOSIS — I69351 Hemiplegia and hemiparesis following cerebral infarction affecting right dominant side: Secondary | ICD-10-CM

## 2021-10-29 DIAGNOSIS — R0602 Shortness of breath: Secondary | ICD-10-CM

## 2021-10-29 DIAGNOSIS — Z79899 Other long term (current) drug therapy: Secondary | ICD-10-CM

## 2021-10-29 DIAGNOSIS — R918 Other nonspecific abnormal finding of lung field: Secondary | ICD-10-CM | POA: Diagnosis present

## 2021-10-29 DIAGNOSIS — I2699 Other pulmonary embolism without acute cor pulmonale: Secondary | ICD-10-CM | POA: Diagnosis present

## 2021-10-29 DIAGNOSIS — N179 Acute kidney failure, unspecified: Secondary | ICD-10-CM

## 2021-10-29 DIAGNOSIS — R7989 Other specified abnormal findings of blood chemistry: Secondary | ICD-10-CM | POA: Diagnosis present

## 2021-10-29 DIAGNOSIS — R911 Solitary pulmonary nodule: Secondary | ICD-10-CM | POA: Diagnosis present

## 2021-10-29 DIAGNOSIS — Z7982 Long term (current) use of aspirin: Secondary | ICD-10-CM

## 2021-10-29 DIAGNOSIS — B20 Human immunodeficiency virus [HIV] disease: Secondary | ICD-10-CM | POA: Diagnosis present

## 2021-10-29 DIAGNOSIS — Z79818 Long term (current) use of other agents affecting estrogen receptors and estrogen levels: Secondary | ICD-10-CM

## 2021-10-29 DIAGNOSIS — Z7951 Long term (current) use of inhaled steroids: Secondary | ICD-10-CM

## 2021-10-29 DIAGNOSIS — F32A Depression, unspecified: Secondary | ICD-10-CM | POA: Diagnosis present

## 2021-10-29 DIAGNOSIS — R590 Localized enlarged lymph nodes: Secondary | ICD-10-CM | POA: Diagnosis present

## 2021-10-29 DIAGNOSIS — A312 Disseminated mycobacterium avium-intracellulare complex (DMAC): Secondary | ICD-10-CM | POA: Diagnosis present

## 2021-10-29 DIAGNOSIS — Z87891 Personal history of nicotine dependence: Secondary | ICD-10-CM

## 2021-10-29 DIAGNOSIS — A310 Pulmonary mycobacterial infection: Secondary | ICD-10-CM | POA: Diagnosis present

## 2021-10-29 DIAGNOSIS — Z884 Allergy status to anesthetic agent status: Secondary | ICD-10-CM

## 2021-10-29 DIAGNOSIS — I82431 Acute embolism and thrombosis of right popliteal vein: Secondary | ICD-10-CM | POA: Diagnosis present

## 2021-10-29 DIAGNOSIS — I82461 Acute embolism and thrombosis of right calf muscular vein: Secondary | ICD-10-CM | POA: Diagnosis present

## 2021-10-29 LAB — URINALYSIS, ROUTINE W REFLEX MICROSCOPIC
Bilirubin Urine: NEGATIVE
Glucose, UA: NEGATIVE mg/dL
Hgb urine dipstick: NEGATIVE
Ketones, ur: NEGATIVE mg/dL
Leukocytes,Ua: NEGATIVE
Nitrite: NEGATIVE
Protein, ur: NEGATIVE mg/dL
Specific Gravity, Urine: 1.021 (ref 1.005–1.030)
pH: 5 (ref 5.0–8.0)

## 2021-10-29 LAB — CBC WITH DIFFERENTIAL/PLATELET
Abs Immature Granulocytes: 0.39 10*3/uL — ABNORMAL HIGH (ref 0.00–0.07)
Basophils Absolute: 0 10*3/uL (ref 0.0–0.1)
Basophils Relative: 1 %
Eosinophils Absolute: 0.1 10*3/uL (ref 0.0–0.5)
Eosinophils Relative: 1 %
HCT: 35.5 % — ABNORMAL LOW (ref 39.0–52.0)
Hemoglobin: 11.4 g/dL — ABNORMAL LOW (ref 13.0–17.0)
Immature Granulocytes: 5 %
Lymphocytes Relative: 13 %
Lymphs Abs: 0.9 10*3/uL (ref 0.7–4.0)
MCH: 28.4 pg (ref 26.0–34.0)
MCHC: 32.1 g/dL (ref 30.0–36.0)
MCV: 88.5 fL (ref 80.0–100.0)
Monocytes Absolute: 0.9 10*3/uL (ref 0.1–1.0)
Monocytes Relative: 13 %
Neutro Abs: 4.8 10*3/uL (ref 1.7–7.7)
Neutrophils Relative %: 67 %
Platelets: 359 10*3/uL (ref 150–400)
RBC: 4.01 MIL/uL — ABNORMAL LOW (ref 4.22–5.81)
RDW: 16.8 % — ABNORMAL HIGH (ref 11.5–15.5)
WBC: 7.2 10*3/uL (ref 4.0–10.5)
nRBC: 0 % (ref 0.0–0.2)

## 2021-10-29 LAB — COMPREHENSIVE METABOLIC PANEL
ALT: 116 U/L — ABNORMAL HIGH (ref 0–44)
AST: 97 U/L — ABNORMAL HIGH (ref 15–41)
Albumin: 3.4 g/dL — ABNORMAL LOW (ref 3.5–5.0)
Alkaline Phosphatase: 310 U/L — ABNORMAL HIGH (ref 38–126)
Anion gap: 11 (ref 5–15)
BUN: 21 mg/dL — ABNORMAL HIGH (ref 6–20)
CO2: 17 mmol/L — ABNORMAL LOW (ref 22–32)
Calcium: 10.1 mg/dL (ref 8.9–10.3)
Chloride: 107 mmol/L (ref 98–111)
Creatinine, Ser: 1.08 mg/dL (ref 0.61–1.24)
GFR, Estimated: >60 mL/min (ref >60)
Glucose, Bld: 98 mg/dL (ref 70–99)
Potassium: 4.5 mmol/L (ref 3.5–5.1)
Sodium: 135 mmol/L (ref 135–145)
Total Bilirubin: 0.6 mg/dL (ref 0.3–1.2)
Total Protein: 8.4 g/dL — ABNORMAL HIGH (ref 6.5–8.1)

## 2021-10-29 LAB — PROTIME-INR
INR: 1.1 (ref 0.8–1.2)
Prothrombin Time: 13.9 seconds (ref 11.4–15.2)

## 2021-10-29 LAB — BLOOD GAS, ARTERIAL
Acid-base deficit: 6.1 mmol/L — ABNORMAL HIGH (ref 0.0–2.0)
Bicarbonate: 15.6 mmol/L — ABNORMAL LOW (ref 20.0–28.0)
O2 Saturation: 97.1 %
Patient temperature: 37
pCO2 arterial: 22 mmHg — ABNORMAL LOW (ref 32–48)
pH, Arterial: 7.46 — ABNORMAL HIGH (ref 7.35–7.45)
pO2, Arterial: 78 mmHg — ABNORMAL LOW (ref 83–108)

## 2021-10-29 LAB — BRAIN NATRIURETIC PEPTIDE: B Natriuretic Peptide: 321.7 pg/mL — ABNORMAL HIGH (ref 0.0–100.0)

## 2021-10-29 LAB — LACTIC ACID, PLASMA
Lactic Acid, Venous: 1.3 mmol/L (ref 0.5–1.9)
Lactic Acid, Venous: 1.3 mmol/L (ref 0.5–1.9)

## 2021-10-29 LAB — TROPONIN I (HIGH SENSITIVITY)
Troponin I (High Sensitivity): 8 ng/L (ref <18)
Troponin I (High Sensitivity): 7 ng/L (ref <18)

## 2021-10-29 LAB — PROCALCITONIN: Procalcitonin: 0.37 ng/mL

## 2021-10-29 LAB — APTT: aPTT: 37 seconds — ABNORMAL HIGH (ref 24–36)

## 2021-10-29 MED ORDER — LACTATED RINGERS IV BOLUS (SEPSIS)
1000.0000 mL | Freq: Once | INTRAVENOUS | Status: AC
  Administered 2021-10-29: 1000 mL via INTRAVENOUS

## 2021-10-29 MED ORDER — CHLORHEXIDINE GLUCONATE CLOTH 2 % EX PADS
6.0000 | MEDICATED_PAD | Freq: Every day | CUTANEOUS | Status: DC
  Administered 2021-10-30 – 2021-11-01 (×4): 6 via TOPICAL

## 2021-10-29 MED ORDER — IOHEXOL 350 MG/ML SOLN
80.0000 mL | Freq: Once | INTRAVENOUS | Status: AC | PRN
  Administered 2021-10-29: 80 mL via INTRAVENOUS

## 2021-10-29 MED ORDER — SODIUM CHLORIDE 0.9 % IV SOLN
2.0000 g | Freq: Once | INTRAVENOUS | Status: AC
  Administered 2021-10-29: 2 g via INTRAVENOUS
  Filled 2021-10-29: qty 12.5

## 2021-10-29 MED ORDER — SODIUM CHLORIDE 0.9 % IV SOLN
500.0000 mg | Freq: Once | INTRAVENOUS | Status: AC
  Administered 2021-10-29: 500 mg via INTRAVENOUS
  Filled 2021-10-29: qty 5

## 2021-10-29 MED ORDER — VANCOMYCIN HCL IN DEXTROSE 1-5 GM/200ML-% IV SOLN
1000.0000 mg | Freq: Once | INTRAVENOUS | Status: AC
  Administered 2021-10-29: 1000 mg via INTRAVENOUS
  Filled 2021-10-29: qty 200

## 2021-10-29 MED ORDER — HEPARIN BOLUS VIA INFUSION
2000.0000 [IU] | Freq: Once | INTRAVENOUS | Status: AC
  Administered 2021-10-29: 2000 [IU] via INTRAVENOUS
  Filled 2021-10-29: qty 2000

## 2021-10-29 MED ORDER — HEPARIN (PORCINE) 25000 UT/250ML-% IV SOLN
1400.0000 [IU]/h | INTRAVENOUS | Status: DC
  Administered 2021-10-29: 1300 [IU]/h via INTRAVENOUS
  Filled 2021-10-29: qty 250

## 2021-10-29 MED ORDER — ACETAMINOPHEN 325 MG PO TABS
650.0000 mg | ORAL_TABLET | Freq: Once | ORAL | Status: AC
  Administered 2021-10-29: 650 mg via ORAL
  Filled 2021-10-29: qty 2

## 2021-10-29 MED ORDER — LACTATED RINGERS IV BOLUS (SEPSIS)
250.0000 mL | Freq: Once | INTRAVENOUS | Status: AC
  Administered 2021-10-29: 250 mL via INTRAVENOUS

## 2021-10-29 MED ORDER — LACTATED RINGERS IV SOLN: INTRAVENOUS | Status: DC

## 2021-10-29 MED ORDER — LACTATED RINGERS IV BOLUS (SEPSIS)
1000.0000 mL | Freq: Once | INTRAVENOUS | Status: AC
Start: 2021-10-29 — End: 2021-10-29
  Administered 2021-10-29: 1000 mL via INTRAVENOUS

## 2021-10-29 NOTE — Progress Notes (Signed)
ABG sent to lab and lab called

## 2021-10-29 NOTE — Consult Note (Signed)
NAME:  Kurt Foley, MRN:  [id number], DOB:  1976/06/08, LOS: 0 ADMISSION DATE:  10/29/2021, CONSULTATION DATE:  10/29/2021 REFERRING MD: Theodis Blaze, PA, CHIEF COMPLAINT: Pulmonary embolism  History of Present Illness:  Presented to the hospital with shortness of breath shortness of breath for few weeks , Worsened over the last 1 week Did have a mild fever at home  Did have a cough  Worsening symptoms led to presentation to the hospital  CT scan of the chest showing submassive pulmonary embolism  Code sepsis initiated  Pertinent  Medical History   Past Medical History:  Diagnosis Date   Depression    HIV infection ([place])   History of disseminated MAC infection Recent stroke with residual right-sided weakness  Significant Hospital Events: Including procedures, antibiotic start and stop dates in addition to other pertinent events   CT chest with bilateral hilar adenopathy, moderate emphysema right heart strain with RV ratio of 1.4.  Soft tissue masses stable from previous  Interim History / Subjective:  Awake alert interactive On room air  Objective   Blood pressure 100/89, pulse 97, temperature 98.1 F (36.7 C), temperature source Oral, resp. rate (!) 34, height _0  (1.676 m), weight 68 kg, SpO2 96 %.       No intake or output data in the 24 hours ending 10/29/21 1950 Filed Weights   10/29/21 1734  Weight: 68 kg    Examination: General: Middle-age, does not appear to be in distress HENT: Moist oral mucosa Lungs: Decreased air movement at the bases bilaterally, few rhonchi at the bases Cardiovascular: S1-S2 appreciated Abdomen: Soft, bowel sounds appreciated Extremities: No clubbing, no edema Neuro: Alert and oriented x3 GU: Fair output  Resolved Hospital Problem list     Assessment & Plan:  Submassive pulmonary embolism -Difficult to make an argument for fibrinolytics as patient has remained hemodynamically stable, Not having any significant  respiratory complaints at present,  No oxygen requirement  Pesi score of 87  Stable hemodynamics -Heart rate less than 110 -Normal troponin -Elevated BNP  Disseminated MAC infection HIV infection -Follows with infectious disease -On albuterol, Bactrim, Biktarvy -Will is lung nodules, -Ongoing plans for possible navigational bronchoscopy for biopsies  Sepsis -Continue vancomycin and cefepime -De-escalate as cultures dictate  History of stroke about 7 months ago with residual right-sided weakness  History of cocaine dependence -Counseling  Emphysema -Continue bronchodilator treatments  Best Practice (right click and "Reselect all SmartList Selections" daily)   Diet/type: Regular consistency (see orders) DVT prophylaxis: systemic heparin GI prophylaxis: N/A Lines: N/A Foley:  N/A Code Status:  full code Last date of multidisciplinary goals of care discussion [pending]  Labs   CBC: Recent Labs  Lab 10/29/21 1744  WBC 7.2  NEUTROABS 4.8  HGB 11.4*  HCT 35.5*  MCV 88.5  PLT 761    Basic Metabolic Panel: Recent Labs  Lab 10/29/21 1744  NA 135  K 4.5  CL 107  CO2 17*  GLUCOSE 98  BUN 21*  CREATININE 1.08  CALCIUM 10.1   GFR: Estimated Creatinine Clearance: 76.3 mL/min (by C-G formula based on SCr of 1.08 mg/dL). Recent Labs  Lab 10/29/21 1744  WBC 7.2  LATICACIDVEN 1.3    Liver Function Tests: Recent Labs  Lab 10/29/21 1744  AST 97*  ALT 116*  ALKPHOS 310*  BILITOT 0.6  PROT 8.4*  ALBUMIN 3.4*   No results for input(s): LIPASE, AMYLASE in the last 168 hours. No results for input(s): AMMONIA in the last  168 hours.  ABG    Component Value Date/Time   PHART 7.46 (H) 10/29/2021 1756   PCO2ART 22 (L) 10/29/2021 1756   PO2ART 78 (L) 10/29/2021 1756   HCO3 15.6 (L) 10/29/2021 1756   ACIDBASEDEF 6.1 (H) 10/29/2021 1756   O2SAT 97.1 10/29/2021 1756     Coagulation Profile: Recent Labs  Lab 10/29/21 1744  INR 1.1    Cardiac  Enzymes: No results for input(s): CKTOTAL, CKMB, CKMBINDEX, TROPONINI in the last 168 hours.  HbA1C: No results found for: HGBA1C  CBG: No results for input(s): GLUCAP in the last 168 hours.  Review of Systems:   Weight has been stable  Past Medical History:  He,  has a past medical history of Depression and HIV infection ([place]).   Surgical History:  No past surgical history on file.   Social History:   reports that he has quit smoking. His smoking use included cigarettes. He has a 12.50 pack-year smoking history. He has never used smokeless tobacco. He reports that he does not currently use alcohol after a past usage of about 2.0 standard drinks per week. He reports that he does not currently use drugs after having used the following drugs: Marijuana and Cocaine.   Family History:  His family history includes Arthritis in his mother; Diabetes in his mother; Hypertension in his mother; Rheum arthritis in his mother.   Allergies Allergies  Allergen Reactions   Lidocaine Other (See Comments)    Large bruise at site of patch placement     Home Medications  Prior to Admission medications   Medication Sig Start Date End Date Taking? Authorizing Provider  acetaminophen (TYLENOL) 325 MG tablet Take 1-2 tablets (325-650 mg total) by mouth every 4 (four) hours as needed for mild pain. 08/05/21   Love, Ivan Anchors, PA-C  aspirin 81 MG chewable tablet Chew 1 tablet (81 mg total) by mouth daily. 08/05/21   Love, Ivan Anchors, PA-C  azithromycin (ZITHROMAX) 500 MG tablet Take 1 tablet by mouth at bedtime. 10/07/21   Mercy Riding, MD  bictegravir-emtricitabine-tenofovir AF (BIKTARVY) 50-200-25 MG TABS tablet Take 1 tablet by mouth daily. Patient taking differently: Take 1 tablet by mouth every morning. 08/05/21   Love, Ivan Anchors, PA-C  budesonide-formoterol (SYMBICORT) 160-4.5 MCG/ACT inhaler Inhale 2 puffs into the lungs in the morning and at bedtime. 10/07/21   Mercy Riding, MD  ethambutol  (MYAMBUTOL) 400 MG tablet Take 2 tablets by mouth daily. 10/07/21   Mercy Riding, MD  fluconazole (DIFLUCAN) 200 MG tablet Take 1 tablet (200 mg total) by mouth daily for 13 days. 10/19/21 11/01/21  Thayer Headings, MD  loratadine (CLARITIN) 10 MG tablet Take 1 tablet (10 mg total) by mouth daily. Patient taking differently: Take 10 mg by mouth every morning. 09/14/21   Thayer Headings, MD  megestrol (MEGACE) 400 MG/10ML suspension Take 10 mLs (400 mg total) by mouth daily. Patient taking differently: Take 400 mg by mouth every morning. 08/06/21   Love, Ivan Anchors, PA-C  metoprolol succinate (TOPROL-XL) 50 MG 24 hr tablet Take 1 tablet by mouth every morning. Take with or immediately following a meal. 10/07/21   Mercy Riding, MD  mirtazapine (REMERON) 15 MG tablet Take 1 tablet (15 mg total) by mouth at bedtime. 08/05/21   Love, Ivan Anchors, PA-C  Multiple Vitamins-Minerals (CERTAVITE/ANTIOXIDANTS) TABS Take 1 tablet by mouth daily. 08/05/21   Love, Ivan Anchors, PA-C  nystatin cream (MYCOSTATIN) Apply 1 application. topically 2 (two)  times daily. 10/07/21   Mercy Riding, MD  Oxycodone HCl 10 MG TABS Take 0.5-1 tablets (5-10 mg total) by mouth every 6 (six) hours as needed for severe pain. Patient not taking: Reported on 10/19/2021 08/05/21   Love, Ivan Anchors, PA-C  pantoprazole (PROTONIX) 40 MG tablet Take 1 tablet (40 mg total) by mouth daily. 08/05/21   Love, Ivan Anchors, PA-C  polyethylene glycol (MIRALAX / GLYCOLAX) 17 g packet Take 17 g by mouth daily as needed for mild constipation or moderate constipation. 07/20/21   Pokhrel, Corrie Mckusick, MD  QUEtiapine (SEROQUEL) 200 MG tablet Take 1 tablet (200 mg total) by mouth at bedtime. 08/05/21   Love, Ivan Anchors, PA-C  rosuvastatin (CRESTOR) 5 MG tablet Take 1 tablet (5 mg total) by mouth daily. Patient taking differently: Take 5 mg by mouth every morning. 08/17/21   Gildardo Pounds, NP  senna-docusate (SENOKOT-S) 8.6-50 MG tablet Take 1 tablet by mouth 2 (two) times daily. 08/05/21    Love, Ivan Anchors, PA-C  sulfamethoxazole-trimethoprim (BACTRIM DS) 800-160 MG tablet Take 1 tablet by mouth daily. 10/07/21   Mercy Riding, MD  umeclidinium bromide (INCRUSE ELLIPTA) 62.5 MCG/ACT AEPB Inhale 1 puff into the lungs daily. 10/07/21   Mercy Riding, MD     The patient is critically ill with multiple organ systems failure and requires high complexity decision making for assessment and support, frequent evaluation and titration of therapies, application of advanced monitoring technologies and extensive interpretation of multiple databases. Critical Care Time devoted to patient care services described in this note independent of APP/resident time (if applicable)  is 35 minutes.   Sherrilyn Rist MD [facility] Personal pager: See Amion If unanswered, please page CCM On-call: [phone]

## 2021-10-29 NOTE — Progress Notes (Signed)
A consult was received from an ED physician for vancomycin and cefepime per pharmacy dosing (for an indication other than meningitis). The patient's profile has been reviewed for ht/wt/allergies/indication/available labs. A one time order has been placed for the above antibiotics.  Further antibiotics/pharmacy consults should be ordered by admitting physician if indicated.                       Bernadene Person, PharmD, BCPS [phone] 10/29/2021, 5:51 PM

## 2021-10-29 NOTE — Progress Notes (Signed)
ANTICOAGULATION CONSULT NOTE - Initial Consult  Pharmacy Consult for IV heparin Indication: pulmonary embolus  Allergies  Allergen Reactions   Lidocaine Other (See Comments)    Large bruise at site of patch placement    Patient Measurements: Height: 5\' 6"  (167.6 cm) Weight: 68 kg (150 lb) IBW/kg (Calculated) : 63.8 Heparin Dosing Weight: 68 kg  Vital Signs: Temp: 98.1 F (36.7 C) (05/20 1936) Temp Source: Oral (05/20 1936) BP: 100/89 (05/20 1930) Pulse Rate: 97 (05/20 1930)  Labs: Recent Labs    10/29/21 1744  HGB 11.4*  HCT 35.5*  PLT 359  APTT 37*  LABPROT 13.9  INR 1.1  CREATININE 1.08  TROPONINIHS 8    Estimated Creatinine Clearance: 76.3 mL/min (by C-G formula based on SCr of 1.08 mg/dL).   Medical History: Past Medical History:  Diagnosis Date   Depression    HIV infection ([place])     Medications:  Scheduled:  Infusions:   lactated ringers 1,000 mL (10/29/21 1936)   And   lactated ringers     lactated ringers     vancomycin      Assessment: 46 yo presented with CC of shortness of breath has hx HIV and hx CVA approximately 7 months ago. Per CT, patient with bilateral PE with right sided heart strain to begin IV heparin. Baseline labs have been drawn already. Patient not on DOAC prior to admission  Goal of Therapy:  Heparin level 0.3-0.7 units/ml Monitor platelets by anticoagulation protocol: Yes   Plan:  IV heparin bolus of 2000 units then IV heparin rate of 1300 units/hr Check heparin level 6 hours after start of IV heparin Daily CBC and HL  Kara Mead 10/29/2021,7:46 PM

## 2021-10-29 NOTE — ED Notes (Signed)
Critical care MD to bedside

## 2021-10-29 NOTE — ED Provider Notes (Addendum)
[facility] DEPT Provider Note   CSN: [id number] Arrival date & time: 10/29/21  1727     History  Chief Complaint  Patient presents with   Shortness of Breath    se   Code Sepsis    Kurt Foley is a 46 y.o. male.  With past medical history of stroke with right-sided residual, HIV/AIDS, PML, disseminated MAC presents to the emergency department with shortness of breath.  Presents via EMS.  Patient is speaking in 1-2 word sentences.  States that he has had shortness of breath for weeks which is progressively worsened over the past 1 week.  He endorses having fever up to 101.  Has been having productive cough.  Denies any nausea or vomiting or diarrhea.  Denies any urinary symptoms.  He states that he is taking antiretrovirals and is followed by infectious disease.   Shortness of Breath Associated symptoms: cough and fever   Associated symptoms: no vomiting       Home Medications Prior to Admission medications   Medication Sig Start Date End Date Taking? Authorizing Provider  acetaminophen (TYLENOL) 325 MG tablet Take 1-2 tablets (325-650 mg total) by mouth every 4 (four) hours as needed for mild pain. 08/05/21   Love, Ivan Anchors, PA-C  aspirin 81 MG chewable tablet Chew 1 tablet (81 mg total) by mouth daily. 08/05/21   Love, Ivan Anchors, PA-C  azithromycin (ZITHROMAX) 500 MG tablet Take 1 tablet by mouth at bedtime. 10/07/21   Mercy Riding, MD  bictegravir-emtricitabine-tenofovir AF (BIKTARVY) 50-200-25 MG TABS tablet Take 1 tablet by mouth daily. Patient taking differently: Take 1 tablet by mouth every morning. 08/05/21   Love, Ivan Anchors, PA-C  budesonide-formoterol (SYMBICORT) 160-4.5 MCG/ACT inhaler Inhale 2 puffs into the lungs in the morning and at bedtime. 10/07/21   Mercy Riding, MD  ethambutol (MYAMBUTOL) 400 MG tablet Take 2 tablets by mouth daily. 10/07/21   Mercy Riding, MD  fluconazole (DIFLUCAN) 200 MG tablet Take 1 tablet (200 mg total) by  mouth daily for 13 days. 10/19/21 11/01/21  Thayer Headings, MD  loratadine (CLARITIN) 10 MG tablet Take 1 tablet (10 mg total) by mouth daily. Patient taking differently: Take 10 mg by mouth every morning. 09/14/21   Thayer Headings, MD  megestrol (MEGACE) 400 MG/10ML suspension Take 10 mLs (400 mg total) by mouth daily. Patient taking differently: Take 400 mg by mouth every morning. 08/06/21   Love, Ivan Anchors, PA-C  metoprolol succinate (TOPROL-XL) 50 MG 24 hr tablet Take 1 tablet by mouth every morning. Take with or immediately following a meal. 10/07/21   Mercy Riding, MD  mirtazapine (REMERON) 15 MG tablet Take 1 tablet (15 mg total) by mouth at bedtime. 08/05/21   Love, Ivan Anchors, PA-C  Multiple Vitamins-Minerals (CERTAVITE/ANTIOXIDANTS) TABS Take 1 tablet by mouth daily. 08/05/21   Love, Ivan Anchors, PA-C  nystatin cream (MYCOSTATIN) Apply 1 application. topically 2 (two) times daily. 10/07/21   Mercy Riding, MD  Oxycodone HCl 10 MG TABS Take 0.5-1 tablets (5-10 mg total) by mouth every 6 (six) hours as needed for severe pain. Patient not taking: Reported on 10/19/2021 08/05/21   Love, Ivan Anchors, PA-C  pantoprazole (PROTONIX) 40 MG tablet Take 1 tablet (40 mg total) by mouth daily. 08/05/21   Love, Ivan Anchors, PA-C  polyethylene glycol (MIRALAX / GLYCOLAX) 17 g packet Take 17 g by mouth daily as needed for mild constipation or moderate constipation. 07/20/21  Pokhrel, Laxman, MD  QUEtiapine (SEROQUEL) 200 MG tablet Take 1 tablet (200 mg total) by mouth at bedtime. 08/05/21   Love, Ivan Anchors, PA-C  rosuvastatin (CRESTOR) 5 MG tablet Take 1 tablet (5 mg total) by mouth daily. Patient taking differently: Take 5 mg by mouth every morning. 08/17/21   Gildardo Pounds, NP  senna-docusate (SENOKOT-S) 8.6-50 MG tablet Take 1 tablet by mouth 2 (two) times daily. 08/05/21   Love, Ivan Anchors, PA-C  sulfamethoxazole-trimethoprim (BACTRIM DS) 800-160 MG tablet Take 1 tablet by mouth daily. 10/07/21   Mercy Riding, MD   umeclidinium bromide (INCRUSE ELLIPTA) 62.5 MCG/ACT AEPB Inhale 1 puff into the lungs daily. 10/07/21   Mercy Riding, MD      Allergies    Lidocaine    Review of Systems   Review of Systems  Constitutional:  Positive for fever.  Respiratory:  Positive for cough and shortness of breath.   Gastrointestinal:  Negative for diarrhea, nausea and vomiting.  Genitourinary:  Negative for dysuria.  All other systems reviewed and are negative.  Physical Exam Updated Vital Signs BP 110/84   Pulse (!) 114   Temp (!) 100.4 F (38 C) (Oral)   Resp (!) 28   Ht _0  (1.676 m)   Wt 68 kg   SpO2 98%   BMI 24.21 kg/m  Physical Exam Vitals and nursing note reviewed.  Constitutional:      General: He is in acute distress.     Appearance: Normal appearance. He is ill-appearing and toxic-appearing.  HENT:     Head: Normocephalic and atraumatic.     Mouth/Throat:     Mouth: Mucous membranes are moist.  Eyes:     General: No scleral icterus.    Extraocular Movements: Extraocular movements intact.     Pupils: Pupils are equal, round, and reactive to light.  Cardiovascular:     Rate and Rhythm: Regular rhythm. Tachycardia present.     Pulses: Normal pulses.     Heart sounds: No murmur heard. Pulmonary:     Effort: Tachypnea and respiratory distress present.     Breath sounds: Examination of the right-lower field reveals decreased breath sounds. Examination of the left-lower field reveals rhonchi. Decreased breath sounds and rhonchi present. No wheezing.  Chest:     Chest wall: No tenderness.  Abdominal:     General: Bowel sounds are normal.     Palpations: Abdomen is soft.     Tenderness: There is no abdominal tenderness.  Musculoskeletal:        General: Normal range of motion.     Cervical back: Neck supple.  Skin:    General: Skin is warm and dry.     Capillary Refill: Capillary refill takes less than 2 seconds.  Neurological:     General: No focal deficit present.     Mental  Status: He is alert and oriented to person, place, and time. Mental status is at baseline.  Psychiatric:        Mood and Affect: Mood normal.        Behavior: Behavior normal.        Thought Content: Thought content normal.        Judgment: Judgment normal.    ED Results / Procedures / Treatments   Labs (all labs ordered are listed, but only abnormal results are displayed) Labs Reviewed  COMPREHENSIVE METABOLIC PANEL - Abnormal; Notable for the following components:      Result Value   CO2  17 (*)    BUN 21 (*)    Total Protein 8.4 (*)    Albumin 3.4 (*)    AST 97 (*)    ALT 116 (*)    Alkaline Phosphatase 310 (*)    All other components within normal limits  CBC WITH DIFFERENTIAL/PLATELET - Abnormal; Notable for the following components:   RBC 4.01 (*)    Hemoglobin 11.4 (*)    HCT 35.5 (*)    RDW 16.8 (*)    Abs Immature Granulocytes 0.39 (*)    All other components within normal limits  APTT - Abnormal; Notable for the following components:   aPTT 37 (*)    All other components within normal limits  BRAIN NATRIURETIC PEPTIDE - Abnormal; Notable for the following components:   B Natriuretic Peptide 321.7 (*)    All other components within normal limits  BLOOD GAS, ARTERIAL - Abnormal; Notable for the following components:   pH, Arterial 7.46 (*)    pCO2 arterial 22 (*)    pO2, Arterial 78 (*)    Bicarbonate 15.6 (*)    Acid-base deficit 6.1 (*)    All other components within normal limits  CULTURE, BLOOD (ROUTINE X 2)  CULTURE, BLOOD (ROUTINE X 2)  URINE CULTURE  RESP PANEL BY RT-PCR (FLU A&B, COVID) ARPGX2  LACTIC ACID, PLASMA  PROTIME-INR  URINALYSIS, ROUTINE W REFLEX MICROSCOPIC  LACTIC ACID, PLASMA  TROPONIN I (HIGH SENSITIVITY)  TROPONIN I (HIGH SENSITIVITY)   EKG EKG Interpretation  Date/Time:  Saturday Oct 29 2021 17:45:01 EDT Ventricular Rate:  114 PR Interval:  125 QRS Duration: 76 QT Interval:  325 QTC Calculation: 448 R Axis:   55 Text  Interpretation: Sinus tachycardia Probable left atrial enlargement No significant change since last tracing Confirmed by Wandra Arthurs [phone]) on 10/29/2021 6:21:47 PM  Radiology CT Angio Chest PE W and/or Wo Contrast  Result Date: 10/29/2021 CLINICAL DATA:  Concern for pulmonary embolism. EXAM: CT ANGIOGRAPHY CHEST WITH CONTRAST TECHNIQUE: Multidetector CT imaging of the chest was performed using the standard protocol during bolus administration of intravenous contrast. Multiplanar CT image reconstructions and MIPs were obtained to evaluate the vascular anatomy. RADIATION DOSE REDUCTION: This exam was performed according to the departmental dose-optimization program which includes automated exposure control, adjustment of the mA and/or kV according to patient size and/or use of iterative reconstruction technique. CONTRAST:  27m OMNIPAQUE IOHEXOL 350 MG/ML SOLN COMPARISON:  Chest CT dated 10/06/2021 and radiograph dated 10/29/2021. FINDINGS: Evaluation of this exam is limited due to respiratory motion artifact. Cardiovascular: There is no cardiomegaly or pericardial effusion. There is coronary vascular calcification, advanced for the patient's age. Mild atherosclerotic calcification of the thoracic aorta. No aneurysmal dilatation or dissection. Evaluation of the pulmonary arteries is limited due to respiratory motion artifact. Bilateral pulmonary artery emboli involving the lobar branches of the right lower lobe extending into the segmental branches as well as in the left upper lobar branch. The right ventricular lumen measures approximately 4.0 cm and the left ventricular lumen measures 2.8 cm with RV/LV ratio of 1.4 consistent with right heart straining. Correlation with clinical exam and echocardiogram recommended. Mediastinum/Nodes: Similar appearance of bilateral hilar and mediastinal soft tissue masses or adenopathy measuring approximately 2 x 4 cm in the prevascular space. Subcarinal lymph node measures  16 mm in short axis. The esophagus is grossly unremarkable. No mediastinal fluid collection. Lungs/Pleura: Background of moderate emphysema. Similar appearance of right upper lobe subpleural masslike density measuring 3.3 x 1.4  cm as well as a 9 mm nodular density in the left upper lobe, unchanged since the study of 10/06/2021. Left lung base linear atelectasis/scarring. No new consolidation. There is no pleural effusion or pneumothorax. The central airways are patent. Upper Abdomen: No acute abnormality. Musculoskeletal: No chest wall abnormality. No acute or significant osseous findings. Review of the MIP images confirms the above findings. IMPRESSION: 1. Bilateral pulmonary artery emboli with CT evidence of right heart straining (RV/LV Ratio = 1.4) consistent with at least submassive (intermediate risk) PE. The presence of right heart strain has been associated with an increased risk of morbidity and mortality. 2. Similar appearance of bilateral hilar and mediastinal soft tissue masses or adenopathy as well as bilateral upper lobe pulmonary nodules. 3. Coronary vascular calcification, advanced for the patient's age. 4. Aortic Atherosclerosis (ICD10-I70.0) and Emphysema (ICD10-J43.9). These results were called by telephone at the time of interpretation on 10/29/2021 at 7:28 pm to provider Theodis Blaze , who verbally acknowledged these results. Electronically Signed   By: Anner Crete M.D.   On: 10/29/2021 19:28   DG Chest Port 1 View  Result Date: 10/29/2021 CLINICAL DATA:  Questionable sepsis. EXAM: PORTABLE CHEST 1 VIEW COMPARISON:  Chest x-ray 10/06/2021.  CT chest 10/06/2021. FINDINGS: The heart size and mediastinal contours are within normal limits. Focal opacity in the right upper lobe appears unchanged. Lungs are otherwise clear. The visualized skeletal structures are unremarkable. IMPRESSION: Stable exam.  No acute cardiopulmonary process. Electronically Signed   By: Ronney Asters M.D.   On:  10/29/2021 18:37    Procedures .Critical Care Performed by: Mickie Hillier, PA-C Authorized by: Mickie Hillier, PA-C   Critical care provider statement:    Critical care time (minutes):  40   Critical care time was exclusive of:  Separately billable procedures and treating other patients and teaching time   Critical care was necessary to treat or prevent imminent or life-threatening deterioration of the following conditions:  Respiratory failure   Critical care was time spent personally by me on the following activities:  Blood draw for specimens, development of treatment plan with patient or surrogate, evaluation of patient's response to treatment, examination of patient, discussions with primary provider, discussions with consultants, interpretation of cardiac output measurements, obtaining history from patient or surrogate, vascular access procedures, review of old charts, re-evaluation of patient's condition and pulse oximetry   I assumed direction of critical care for this patient from another provider in my specialty: no     Care discussed with: admitting provider     Medications Ordered in ED Medications  lactated ringers infusion (has no administration in time range)  lactated ringers bolus 1,000 mL (1,000 mLs Intravenous New Bag/Given 10/29/21 1752)    And  lactated ringers bolus 1,000 mL (1,000 mLs Intravenous New Bag/Given 10/29/21 1936)    And  lactated ringers bolus 250 mL (has no administration in time range)  vancomycin (VANCOCIN) IVPB 1000 mg/200 mL premix (1,000 mg Intravenous New Bag/Given 10/29/21 2057)  heparin ADULT infusion 100 units/mL (25000 units/225m) (1,300 Units/hr Intravenous New Bag/Given 10/29/21 2027)  ceFEPIme (MAXIPIME) 2 g in sodium chloride 0.9 % 100 mL IVPB (0 g Intravenous Stopped 10/29/21 1832)  azithromycin (ZITHROMAX) 500 mg in sodium chloride 0.9 % 250 mL IVPB (0 mg Intravenous Stopped 10/29/21 2017)  acetaminophen (TYLENOL) tablet 650 mg (650 mg Oral  Given 10/29/21 1757)  iohexol (OMNIPAQUE) 350 MG/ML injection 80 mL (80 mLs Intravenous Contrast Given 10/29/21 1912)  heparin bolus via  infusion 2,000 Units (2,000 Units Intravenous Bolus from Bag 10/29/21 2028)    ED Course/ Medical Decision Making/ A&P                           Medical Decision Making Amount and/or Complexity of Data Reviewed Labs: ordered. Radiology: ordered. ECG/medicine tests: ordered.  Risk OTC drugs. Prescription drug management. Decision regarding hospitalization.  This patient presents to the ED for concern of shortness of breath, this involves an extensive number of treatment options, and is a complaint that carries with it a high risk of complications and morbidity.  The differential diagnosis includes Acute chest syndrome, stable angina, atypical angina, pulmonary embolism, pneumothorax, dissection, pleural effusion, CHF, COPD, asthma, myocarditis, pericarditis, chest wall pain   Co morbidities that complicate the patient evaluation AIDS  Additional history obtained:  Additional history obtained from: family member at bedside  External records from outside source obtained and reviewed including: most recent ID physician note   EKG: EKG: sinus tachycardia.   Cardiac Monitoring: The patient was maintained on a cardiac monitor.  I personally viewed and interpreted the cardiac monitored which showed an underlying rhythm of: sinus tachycardia   Lab Results: I personally ordered, reviewed, and interpreted labs. Pertinent results include: ABG with respiratory alkalosis BNP 321, elevated from previous but not overtly in heart failure Initial troponin 8, repeat 7 CMP with transaminitis, chronically elevated CBC with anemia at baseline Blood cultures pending UA negative, urine culture pending Respiratory panel pending Lactic 1.3x2  Imaging Studies ordered:  I ordered imaging studies which included x-ray and CT.  I independently reviewed & interpreted  imaging & am in agreement with radiology impression. Imaging shows: Chest x-ray with no acute cardiopulmonary process CTA PE study shows bilateral pulmonary emboli with right heart strain with RV to LV ratio of 1.4  Medications  I ordered medication including vancomycin, cefepime, azithromycin for sepsis.  LR weight-based bolus for sepsis protocol, Tylenol for fever, heparin for PE Reevaluation of the patient after medication shows that patient improved -I reviewed the patient's home medications and did not make adjustments. -I did not prescribe new home medications.  Tests Considered: No further testing at this time  Critical Interventions: Heparin drip  Consultations: I requested consultation with the PCCM Dr. Ander Slade @ [phone] who will come evaluate the patient  ,  and discussed lab and imaging findings as well as pertinent plan - they recommend: Echo, and hospitalist admission. 2135: Spoke with Flossie Buffy, MD hospitalist who agrees to admission.   SDH None identified at this time  ED Course: 46 year old male who presents to the emergency department with tachypnea, cough, fever.  Presented initially as a code sepsis.  He was initiated on empiric antibiotics and obtain sepsis labs as well as troponin, BNP given his shortness of breath symptoms. Initial chest x-ray with no abnormalities. Initial concern for pneumonia not completely ruled out with CXR. Ordered CTA PE study which will better evaluate lung parenchyma as well as r/o acute PE for cause of SOB. EKG without ischemia or infarction, troponin x2 negative.  Doubt ACS at this time. No history of asthma and COPD. Doubt these etiologies.  While pending CTA PE study, CBC without leukocytosis, although this is an immunocompromised patient who may not mount a white count. UA negative for UTI. Resp Panel working. Blood cultures pending. BNP 321. Slightly elevated from previous but does not appear clinically fluid volume overloaded. Doubt heart  failure.   CTA  PE study shows bilateral PE with RV strain with RV/LV ratio 1.4. This is likely cause of patient's shortness of breath. Used Code PE order set for initiating heparin. I also consulted PCCM.   PCCM came to bedside to evaluate the patient. They recommend hospitalist admission. PCCM will order echo while inpatient.   Spoke with Flossie Buffy, MD, hospitalist who agrees to admit the patient.   After consideration of the diagnostic results and the patients response to treatment, I feel that the patent would benefit from admission. The patient has been appropriately medically screened and/or stabilized in the ED.  Final Clinical Impression(s) / ED Diagnoses Final diagnoses:  Shortness of breath    Rx / DC Orders ED Discharge Orders     None         Mickie Hillier, PA-C 10/29/21 2158    Mickie Hillier, PA-C 10/29/21 2159    Drenda Freeze, MD 10/29/21 2248

## 2021-10-29 NOTE — ED Triage Notes (Signed)
Pt to ED via EMS from home c/o increased SHOB over the last week. Pt hx HIV, HX Stroke aprox 7 months ago- residual right side deficit. #22 l hand. NS fluid bolus AND 5MG  Albuterol neb given by EMS. Wheezing noted. Orientation at baseline. Last VS hr 114,. 104/70, 94%RA, RR38, temp 100.4 CBG 118. Per EMS protocol, code sepsis activation.

## 2021-10-29 NOTE — H&P (Incomplete)
History and Physical    Patient: Kurt Foley DOB: [birth year] DOA: 10/29/2021 DOS: the patient was seen and examined on 10/29/2021 PCP: Gildardo Pounds, NP  Patient coming from: Home  Chief Complaint:  Chief Complaint  Patient presents with   Shortness of Breath    se   Code Sepsis   HPI: Kurt Foley is a 46 y.o. male with medical history significant of   Presenting with increasing shortness of breath for the past week.  Also has productive cough and reports 2 days of elevated temperature up to 100F. Has pleuritic chest pain. No nausea, vomiting or diarrhea. No LE edema.    Review of Systems: {ROS_Text:26778} Past Medical History:  Diagnosis Date   Depression    HIV infection ([place])    No past surgical history on file. Social History:  reports that he has quit smoking. His smoking use included cigarettes. He has a 12.50 pack-year smoking history. He has never used smokeless tobacco. He reports that he does not currently use alcohol after a past usage of about 2.0 standard drinks per week. He reports that he does not currently use drugs after having used the following drugs: Marijuana and Cocaine.  Allergies  Allergen Reactions   Lidocaine Other (See Comments)    Large bruise at site of patch placement    Family History  Problem Relation Age of Onset   Rheum arthritis Mother    Diabetes Mother    Hypertension Mother    Arthritis Mother     Prior to Admission medications   Medication Sig Start Date End Date Taking? Authorizing Provider  acetaminophen (TYLENOL) 325 MG tablet Take 1-2 tablets (325-650 mg total) by mouth every 4 (four) hours as needed for mild pain. 08/05/21   Love, Ivan Anchors, PA-C  aspirin 81 MG chewable tablet Chew 1 tablet (81 mg total) by mouth daily. 08/05/21   Love, Ivan Anchors, PA-C  azithromycin (ZITHROMAX) 500 MG tablet Take 1 tablet by mouth at bedtime. 10/07/21   Mercy Riding, MD  bictegravir-emtricitabine-tenofovir AF  (BIKTARVY) 50-200-25 MG TABS tablet Take 1 tablet by mouth daily. Patient taking differently: Take 1 tablet by mouth every morning. 08/05/21   Love, Ivan Anchors, PA-C  budesonide-formoterol (SYMBICORT) 160-4.5 MCG/ACT inhaler Inhale 2 puffs into the lungs in the morning and at bedtime. 10/07/21   Mercy Riding, MD  ethambutol (MYAMBUTOL) 400 MG tablet Take 2 tablets by mouth daily. 10/07/21   Mercy Riding, MD  fluconazole (DIFLUCAN) 200 MG tablet Take 1 tablet (200 mg total) by mouth daily for 13 days. 10/19/21 11/01/21  Thayer Headings, MD  loratadine (CLARITIN) 10 MG tablet Take 1 tablet (10 mg total) by mouth daily. Patient taking differently: Take 10 mg by mouth every morning. 09/14/21   Thayer Headings, MD  megestrol (MEGACE) 400 MG/10ML suspension Take 10 mLs (400 mg total) by mouth daily. Patient taking differently: Take 400 mg by mouth every morning. 08/06/21   Love, Ivan Anchors, PA-C  metoprolol succinate (TOPROL-XL) 50 MG 24 hr tablet Take 1 tablet by mouth every morning. Take with or immediately following a meal. 10/07/21   Mercy Riding, MD  mirtazapine (REMERON) 15 MG tablet Take 1 tablet (15 mg total) by mouth at bedtime. 08/05/21   Love, Ivan Anchors, PA-C  Multiple Vitamins-Minerals (CERTAVITE/ANTIOXIDANTS) TABS Take 1 tablet by mouth daily. 08/05/21   Love, Ivan Anchors, PA-C  nystatin cream (MYCOSTATIN) Apply 1 application. topically 2 (two) times daily. 10/07/21  Mercy Riding, MD  Oxycodone HCl 10 MG TABS Take 0.5-1 tablets (5-10 mg total) by mouth every 6 (six) hours as needed for severe pain. Patient not taking: Reported on 10/19/2021 08/05/21   Love, Ivan Anchors, PA-C  pantoprazole (PROTONIX) 40 MG tablet Take 1 tablet (40 mg total) by mouth daily. 08/05/21   Love, Ivan Anchors, PA-C  polyethylene glycol (MIRALAX / GLYCOLAX) 17 g packet Take 17 g by mouth daily as needed for mild constipation or moderate constipation. 07/20/21   Pokhrel, Corrie Mckusick, MD  QUEtiapine (SEROQUEL) 200 MG tablet Take 1 tablet (200 mg  total) by mouth at bedtime. 08/05/21   Love, Ivan Anchors, PA-C  rosuvastatin (CRESTOR) 5 MG tablet Take 1 tablet (5 mg total) by mouth daily. Patient taking differently: Take 5 mg by mouth every morning. 08/17/21   Gildardo Pounds, NP  senna-docusate (SENOKOT-S) 8.6-50 MG tablet Take 1 tablet by mouth 2 (two) times daily. 08/05/21   Love, Ivan Anchors, PA-C  sulfamethoxazole-trimethoprim (BACTRIM DS) 800-160 MG tablet Take 1 tablet by mouth daily. 10/07/21   Mercy Riding, MD  umeclidinium bromide (INCRUSE ELLIPTA) 62.5 MCG/ACT AEPB Inhale 1 puff into the lungs daily. 10/07/21   Mercy Riding, MD    Physical Exam: Vitals:   10/29/21 1936 10/29/21 2000 10/29/21 2100 10/29/21 2130  BP:  97/66 98/67 (!) 87/75  Pulse:  94 92 91  Resp:  (!) 21 (!) 29 (!) 21  Temp: 98.1 F (36.7 C)     TempSrc: Oral     SpO2:  97% 96% 92%  Weight:      Height:       *** Data Reviewed: {Tip this will not be part of the note when signed- Document your independent interpretation of telemetry tracing, EKG, lab, Radiology test or any other diagnostic tests. Add any new diagnostic test ordered today. (Optional):26781} {Results:26384}  Assessment and Plan: No notes have been filed under this hospital service. Service: Hospitalist     Advance Care Planning:   Code Status: Full Code ***  Consults: ***  Family Communication: ***  Severity of Illness: {Observation/Inpatient:21159}  Author: Orene Desanctis, DO 10/29/2021 9:57 PM  For on call review [email or web address].

## 2021-10-30 ENCOUNTER — Observation Stay (HOSPITAL_COMMUNITY): Payer: Medicaid Other

## 2021-10-30 ENCOUNTER — Other Ambulatory Visit: Payer: Self-pay

## 2021-10-30 ENCOUNTER — Encounter (HOSPITAL_COMMUNITY): Payer: Self-pay | Admitting: Family Medicine

## 2021-10-30 DIAGNOSIS — Z87891 Personal history of nicotine dependence: Secondary | ICD-10-CM | POA: Diagnosis not present

## 2021-10-30 DIAGNOSIS — Z7951 Long term (current) use of inhaled steroids: Secondary | ICD-10-CM | POA: Diagnosis not present

## 2021-10-30 DIAGNOSIS — N179 Acute kidney failure, unspecified: Secondary | ICD-10-CM | POA: Diagnosis not present

## 2021-10-30 DIAGNOSIS — Z79818 Long term (current) use of other agents affecting estrogen receptors and estrogen levels: Secondary | ICD-10-CM | POA: Diagnosis not present

## 2021-10-30 DIAGNOSIS — I2693 Single subsegmental pulmonary embolism without acute cor pulmonale: Secondary | ICD-10-CM

## 2021-10-30 DIAGNOSIS — R0602 Shortness of breath: Secondary | ICD-10-CM | POA: Diagnosis present

## 2021-10-30 DIAGNOSIS — R0600 Dyspnea, unspecified: Secondary | ICD-10-CM | POA: Diagnosis not present

## 2021-10-30 DIAGNOSIS — I69351 Hemiplegia and hemiparesis following cerebral infarction affecting right dominant side: Secondary | ICD-10-CM | POA: Diagnosis not present

## 2021-10-30 DIAGNOSIS — Z884 Allergy status to anesthetic agent status: Secondary | ICD-10-CM | POA: Diagnosis not present

## 2021-10-30 DIAGNOSIS — R918 Other nonspecific abnormal finding of lung field: Secondary | ICD-10-CM | POA: Diagnosis present

## 2021-10-30 DIAGNOSIS — B20 Human immunodeficiency virus [HIV] disease: Secondary | ICD-10-CM | POA: Diagnosis present

## 2021-10-30 DIAGNOSIS — Z79899 Other long term (current) drug therapy: Secondary | ICD-10-CM | POA: Diagnosis not present

## 2021-10-30 DIAGNOSIS — Z8701 Personal history of pneumonia (recurrent): Secondary | ICD-10-CM | POA: Diagnosis not present

## 2021-10-30 DIAGNOSIS — I2699 Other pulmonary embolism without acute cor pulmonale: Secondary | ICD-10-CM | POA: Diagnosis not present

## 2021-10-30 DIAGNOSIS — R911 Solitary pulmonary nodule: Secondary | ICD-10-CM | POA: Diagnosis present

## 2021-10-30 DIAGNOSIS — A310 Pulmonary mycobacterial infection: Secondary | ICD-10-CM | POA: Diagnosis present

## 2021-10-30 DIAGNOSIS — I82461 Acute embolism and thrombosis of right calf muscular vein: Secondary | ICD-10-CM | POA: Diagnosis present

## 2021-10-30 DIAGNOSIS — I2609 Other pulmonary embolism with acute cor pulmonale: Secondary | ICD-10-CM | POA: Diagnosis present

## 2021-10-30 DIAGNOSIS — I82451 Acute embolism and thrombosis of right peroneal vein: Secondary | ICD-10-CM | POA: Diagnosis present

## 2021-10-30 DIAGNOSIS — Z7982 Long term (current) use of aspirin: Secondary | ICD-10-CM | POA: Diagnosis not present

## 2021-10-30 DIAGNOSIS — I82431 Acute embolism and thrombosis of right popliteal vein: Secondary | ICD-10-CM | POA: Diagnosis present

## 2021-10-30 DIAGNOSIS — R590 Localized enlarged lymph nodes: Secondary | ICD-10-CM | POA: Diagnosis present

## 2021-10-30 DIAGNOSIS — A312 Disseminated mycobacterium avium-intracellulare complex (DMAC): Secondary | ICD-10-CM | POA: Diagnosis present

## 2021-10-30 DIAGNOSIS — R7989 Other specified abnormal findings of blood chemistry: Secondary | ICD-10-CM | POA: Diagnosis present

## 2021-10-30 DIAGNOSIS — Z20822 Contact with and (suspected) exposure to covid-19: Secondary | ICD-10-CM | POA: Diagnosis present

## 2021-10-30 DIAGNOSIS — F32A Depression, unspecified: Secondary | ICD-10-CM | POA: Diagnosis present

## 2021-10-30 DIAGNOSIS — Z8673 Personal history of transient ischemic attack (TIA), and cerebral infarction without residual deficits: Secondary | ICD-10-CM | POA: Diagnosis not present

## 2021-10-30 LAB — COMPREHENSIVE METABOLIC PANEL
ALT: 101 U/L — ABNORMAL HIGH (ref 0–44)
AST: 73 U/L — ABNORMAL HIGH (ref 15–41)
Albumin: 3.1 g/dL — ABNORMAL LOW (ref 3.5–5.0)
Alkaline Phosphatase: 279 U/L — ABNORMAL HIGH (ref 38–126)
Anion gap: 9 (ref 5–15)
BUN: 17 mg/dL (ref 6–20)
CO2: 19 mmol/L — ABNORMAL LOW (ref 22–32)
Calcium: 10 mg/dL (ref 8.9–10.3)
Chloride: 111 mmol/L (ref 98–111)
Creatinine, Ser: 0.88 mg/dL (ref 0.61–1.24)
GFR, Estimated: >60 mL/min (ref >60)
Glucose, Bld: 117 mg/dL — ABNORMAL HIGH (ref 70–99)
Potassium: 4.2 mmol/L (ref 3.5–5.1)
Sodium: 139 mmol/L (ref 135–145)
Total Bilirubin: 0.5 mg/dL (ref 0.3–1.2)
Total Protein: 7.5 g/dL (ref 6.5–8.1)

## 2021-10-30 LAB — CBC
HCT: 31.4 % — ABNORMAL LOW (ref 39.0–52.0)
Hemoglobin: 9.9 g/dL — ABNORMAL LOW (ref 13.0–17.0)
MCH: 28 pg (ref 26.0–34.0)
MCHC: 31.5 g/dL (ref 30.0–36.0)
MCV: 88.7 fL (ref 80.0–100.0)
Platelets: 310 10*3/uL (ref 150–400)
RBC: 3.54 MIL/uL — ABNORMAL LOW (ref 4.22–5.81)
RDW: 16.7 % — ABNORMAL HIGH (ref 11.5–15.5)
WBC: 6.5 10*3/uL (ref 4.0–10.5)
nRBC: 0 % (ref 0.0–0.2)

## 2021-10-30 LAB — ECHOCARDIOGRAM COMPLETE
AR max vel: 2.41 cm2
AV Area VTI: 2.33 cm2
AV Area mean vel: 2.67 cm2
AV Mean grad: 4 mmHg
AV Peak grad: 7.2 mmHg
Ao pk vel: 1.34 m/s
Area-P 1/2: 6.17 cm2
Calc EF: 50.8 %
Height: 66 in
S' Lateral: 2.6 cm
Single Plane A2C EF: 50.4 %
Single Plane A4C EF: 51.9 %
Weight: 2430.35 oz

## 2021-10-30 LAB — BLOOD CULTURE ID PANEL (REFLEXED) - BCID2

## 2021-10-30 LAB — RESP PANEL BY RT-PCR (FLU A&B, COVID) ARPGX2
Influenza A by PCR: NEGATIVE
Influenza B by PCR: NEGATIVE
SARS Coronavirus 2 by RT PCR: NEGATIVE

## 2021-10-30 LAB — HEPARIN LEVEL (UNFRACTIONATED)
Heparin Unfractionated: 0.3 IU/mL (ref 0.30–0.70)
Heparin Unfractionated: 0.28 IU/mL — ABNORMAL LOW (ref 0.30–0.70)
Heparin Unfractionated: 0.33 IU/mL (ref 0.30–0.70)

## 2021-10-30 LAB — MRSA NEXT GEN BY PCR, NASAL: MRSA by PCR Next Gen: NOT DETECTED

## 2021-10-30 LAB — EXPECTORATED SPUTUM ASSESSMENT W GRAM STAIN, RFLX TO RESP C

## 2021-10-30 LAB — STREP PNEUMONIAE URINARY ANTIGEN: Strep Pneumo Urinary Antigen: NEGATIVE

## 2021-10-30 MED ORDER — AZITHROMYCIN 250 MG PO TABS: 500.0000 mg | ORAL_TABLET | Freq: Every day | ORAL | Status: DC

## 2021-10-30 MED ORDER — HEPARIN (PORCINE) 25000 UT/250ML-% IV SOLN
1650.0000 [IU]/h | INTRAVENOUS | Status: AC
  Administered 2021-10-31: 1650 [IU]/h via INTRAVENOUS
  Filled 2021-10-30: qty 250

## 2021-10-30 MED ORDER — IPRATROPIUM-ALBUTEROL 0.5-2.5 (3) MG/3ML IN SOLN
3.0000 mL | RESPIRATORY_TRACT | Status: DC | PRN
Start: 2021-10-30 — End: 2021-11-02

## 2021-10-30 MED ORDER — PANTOPRAZOLE SODIUM 40 MG PO TBEC
40.0000 mg | DELAYED_RELEASE_TABLET | Freq: Every day | ORAL | Status: DC
  Administered 2021-10-30 – 2021-11-02 (×4): 40 mg via ORAL
  Filled 2021-10-30 (×4): qty 1

## 2021-10-30 MED ORDER — METOPROLOL SUCCINATE ER 50 MG PO TB24
50.0000 mg | ORAL_TABLET | Freq: Every morning | ORAL | Status: DC
  Administered 2021-10-31 – 2021-11-02 (×3): 50 mg via ORAL
  Filled 2021-10-30 (×2): qty 2
  Filled 2021-10-30: qty 1

## 2021-10-30 MED ORDER — ASPIRIN 81 MG PO CHEW
81.0000 mg | CHEWABLE_TABLET | Freq: Every day | ORAL | Status: DC
Start: 2021-10-31 — End: 2021-10-31

## 2021-10-30 MED ORDER — AZITHROMYCIN 250 MG PO TABS
500.0000 mg | ORAL_TABLET | Freq: Every day | ORAL | Status: DC
Start: 2021-10-30 — End: 2021-11-02
  Administered 2021-10-30 – 2021-11-01 (×3): 500 mg via ORAL
  Filled 2021-10-30 (×3): qty 2

## 2021-10-30 MED ORDER — METOPROLOL TARTRATE 5 MG/5ML IV SOLN
5.0000 mg | INTRAVENOUS | Status: DC | PRN
Start: 2021-10-30 — End: 2021-11-02
  Filled 2021-10-30: qty 5

## 2021-10-30 MED ORDER — UMECLIDINIUM BROMIDE 62.5 MCG/ACT IN AEPB
1.0000 | INHALATION_SPRAY | Freq: Every day | RESPIRATORY_TRACT | Status: DC
  Administered 2021-10-30 – 2021-11-01 (×2): 1 via RESPIRATORY_TRACT
  Filled 2021-10-30: qty 7

## 2021-10-30 MED ORDER — TRAZODONE HCL 50 MG PO TABS: 50.0000 mg | ORAL_TABLET | Freq: Every evening | ORAL | Status: DC | PRN

## 2021-10-30 MED ORDER — CEFEPIME HCL 2 G IV SOLR
2.0000 g | Freq: Three times a day (TID) | INTRAVENOUS | Status: DC
  Administered 2021-10-30: 2 g via INTRAVENOUS
  Filled 2021-10-30: qty 12.5

## 2021-10-30 MED ORDER — GUAIFENESIN 100 MG/5ML PO LIQD
5.0000 mL | ORAL | Status: DC | PRN
  Administered 2021-10-30 (×2): 5 mL via ORAL
  Filled 2021-10-30 (×2): qty 10

## 2021-10-30 MED ORDER — FLUCONAZOLE 100 MG PO TABS
200.0000 mg | ORAL_TABLET | Freq: Every day | ORAL | Status: AC
  Administered 2021-10-31 – 2021-11-02 (×3): 200 mg via ORAL
  Filled 2021-10-30 (×3): qty 2

## 2021-10-30 MED ORDER — HEPARIN (PORCINE) 25000 UT/250ML-% IV SOLN
1500.0000 [IU]/h | INTRAVENOUS | Status: DC
  Administered 2021-10-30: 1500 [IU]/h via INTRAVENOUS
  Filled 2021-10-30: qty 250

## 2021-10-30 MED ORDER — MEGESTROL ACETATE 400 MG/10ML PO SUSP
400.0000 mg | Freq: Every day | ORAL | Status: DC
  Administered 2021-10-31 – 2021-11-02 (×3): 400 mg via ORAL
  Filled 2021-10-30 (×3): qty 10

## 2021-10-30 MED ORDER — SENNOSIDES-DOCUSATE SODIUM 8.6-50 MG PO TABS
1.0000 | ORAL_TABLET | Freq: Two times a day (BID) | ORAL | Status: DC
  Administered 2021-10-31 – 2021-11-02 (×5): 1 via ORAL
  Filled 2021-10-30 (×6): qty 1

## 2021-10-30 MED ORDER — LORATADINE 10 MG PO TABS
10.0000 mg | ORAL_TABLET | Freq: Every morning | ORAL | Status: DC
  Administered 2021-10-31 – 2021-11-02 (×3): 10 mg via ORAL
  Filled 2021-10-30 (×3): qty 1

## 2021-10-30 MED ORDER — ROSUVASTATIN CALCIUM 5 MG PO TABS
5.0000 mg | ORAL_TABLET | Freq: Every day | ORAL | Status: DC
  Administered 2021-10-31 – 2021-11-02 (×3): 5 mg via ORAL
  Filled 2021-10-30 (×3): qty 1

## 2021-10-30 MED ORDER — SENNOSIDES-DOCUSATE SODIUM 8.6-50 MG PO TABS
1.0000 | ORAL_TABLET | Freq: Every evening | ORAL | Status: DC | PRN
Start: 2021-10-30 — End: 2021-11-02

## 2021-10-30 MED ORDER — ETHAMBUTOL HCL 400 MG PO TABS
800.0000 mg | ORAL_TABLET | Freq: Every day | ORAL | Status: DC
  Administered 2021-10-30 – 2021-11-02 (×4): 800 mg via ORAL
  Filled 2021-10-30 (×4): qty 2

## 2021-10-30 MED ORDER — UMECLIDINIUM BROMIDE 62.5 MCG/ACT IN AEPB: 1.0000 | INHALATION_SPRAY | Freq: Every day | RESPIRATORY_TRACT | Status: DC

## 2021-10-30 MED ORDER — MOMETASONE FURO-FORMOTEROL FUM 200-5 MCG/ACT IN AERO
2.0000 | INHALATION_SPRAY | Freq: Two times a day (BID) | RESPIRATORY_TRACT | Status: DC
  Administered 2021-10-30 – 2021-11-02 (×6): 2 via RESPIRATORY_TRACT
  Filled 2021-10-30: qty 8.8

## 2021-10-30 MED ORDER — SULFAMETHOXAZOLE-TRIMETHOPRIM 800-160 MG PO TABS
1.0000 | ORAL_TABLET | Freq: Every day | ORAL | Status: DC
  Administered 2021-10-31 – 2021-11-02 (×3): 1 via ORAL
  Filled 2021-10-30 (×3): qty 1

## 2021-10-30 MED ORDER — HEPARIN BOLUS VIA INFUSION
1000.0000 [IU] | Freq: Once | INTRAVENOUS | Status: AC
  Administered 2021-10-30: 1000 [IU] via INTRAVENOUS
  Filled 2021-10-30: qty 1000

## 2021-10-30 MED ORDER — VANCOMYCIN HCL 1750 MG/350ML IV SOLN: 1750.0000 mg | INTRAVENOUS | Status: DC

## 2021-10-30 MED ORDER — LACTATED RINGERS IV SOLN: INTRAVENOUS | Status: DC

## 2021-10-30 MED ORDER — BICTEGRAVIR-EMTRICITAB-TENOFOV 50-200-25 MG PO TABS
1.0000 | ORAL_TABLET | Freq: Every morning | ORAL | Status: DC
  Administered 2021-10-30 – 2021-11-02 (×4): 1 via ORAL
  Filled 2021-10-30 (×4): qty 1

## 2021-10-30 MED ORDER — POLYETHYLENE GLYCOL 3350 17 G PO PACK: 17.0000 g | PACK | Freq: Every day | ORAL | Status: DC | PRN

## 2021-10-30 MED ORDER — QUETIAPINE FUMARATE 200 MG PO TABS
200.0000 mg | ORAL_TABLET | Freq: Every day | ORAL | Status: DC
  Administered 2021-10-30 – 2021-11-01 (×3): 200 mg via ORAL
  Filled 2021-10-30 (×2): qty 2
  Filled 2021-10-30: qty 1

## 2021-10-30 MED ORDER — HYDRALAZINE HCL 20 MG/ML IJ SOLN
10.0000 mg | INTRAMUSCULAR | Status: DC | PRN
Start: 2021-10-30 — End: 2021-11-02

## 2021-10-30 MED ORDER — BENZONATATE 100 MG PO CAPS
100.0000 mg | ORAL_CAPSULE | Freq: Three times a day (TID) | ORAL | Status: AC | PRN
  Administered 2021-10-30 – 2021-11-01 (×4): 100 mg via ORAL
  Filled 2021-10-30 (×4): qty 1

## 2021-10-30 MED ORDER — MIRTAZAPINE 15 MG PO TABS
15.0000 mg | ORAL_TABLET | Freq: Every day | ORAL | Status: DC
  Administered 2021-10-30 – 2021-11-01 (×3): 15 mg via ORAL
  Filled 2021-10-30 (×3): qty 1

## 2021-10-30 NOTE — Progress Notes (Signed)
Bilateral lower extremity venous duplex has been completed. Preliminary results can be found in CV Proc through chart review.  Results were given to the patient's nurse, Kayla.  10/30/21 10:01 AM Olen Cordial RVT

## 2021-10-30 NOTE — Assessment & Plan Note (Signed)
-   This was found during recent admission several weeks ago.  There was plans for navigational bronchoscopy outpatient with pulmonology.  With new acute pulmonary embolism it does make the mass more concerning for malignancy.

## 2021-10-30 NOTE — Consult Note (Addendum)
NAME:  Kurt Foley, MRN:  [id number], DOB:  May 05, 1976, LOS: 0 ADMISSION DATE:  10/29/2021, CONSULTATION DATE:  10/29/2021 REFERRING MD: Theodis Blaze, PA, CHIEF COMPLAINT: Pulmonary embolism  History of Present Illness:  Presented to the hospital with shortness of breath shortness of breath for few weeks , Worsened over the last 1 week Did have a mild fever at home  Did have a cough  Worsening symptoms led to presentation to the hospital  CT scan of the chest showing submassive pulmonary embolism  Code sepsis initiated  Pertinent  Medical History   Past Medical History:  Diagnosis Date   Depression    HIV infection ([place])   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  Soft tissue masses stable from previous.   Interim History / Subjective:   No acute events overnight. Patient without complaints at this time.   Echo without right heart strain  LE US shows acute RLE DVT  Objective   Blood pressure 113/90, pulse 92, temperature (!) 97.5 F (36.4 C), temperature source Oral, resp. rate (!) 31, height _0  (1.676 m), weight 68.9 kg, SpO2 98 %.        Intake/Output Summary (Last 24 hours) at 10/30/2021 0738 Last data filed at 10/30/2021 0600 Gross per 24 hour  Intake 1721.91 ml  Output 1490 ml  Net 231.91 ml   Filed Weights   10/29/21 1734 10/30/21 0000  Weight: 68 kg 68.9 kg    Examination: General: middle aged male, no acute distress HENT: [place]/AT, moist mucous membranes, sclera anicteric Lungs: clear to auscultation bilaterally Cardiovascular: S1-S2 appreciated Abdomen: Soft, bowel sounds  appreciated Extremities: No clubbing, no edema Neuro: Alert and oriented x3 GU: [facility] Problem list     Assessment & Plan:  Submassive pulmonary embolism Acute RLE DVT Pesi score of 87 on admission. Echo without RV strain. Elevated BNP but normal troponin. Lactic acid not elevated - Continue heparin drip - Can transition to eliquis or Xarelto tomorrow evening  Disseminated MAC infection HIV infection -Follows with infectious disease -On albuterol, Bactrim, Biktarvy -Will is lung nodules, -Ongoing plans for possible navigational bronchoscopy for biopsies  Sepsis -Continue vancomycin and cefepime -De-escalate as cultures dictate  History of stroke about 7 months ago with residual right-sided weakness  History of cocaine dependence -Counseling  Emphysema RUL Lung Mass -Continue bronchodilator treatments - We will need to re-schedule his appointment with Dr. Valeta Harms on 5/22 to a later date to discuss bronchoscopy for mass biopsy  PCCM will continue to follow  Best Practice (right click and "Reselect all SmartList Selections" daily)   Diet/type: Regular consistency (see orders) DVT prophylaxis: systemic heparin GI prophylaxis: N/A Lines: N/A Foley:  N/A Code Status:  full code Last date of multidisciplinary goals of care discussion [pending]  Labs   CBC: Recent Labs  Lab 10/29/21 1744 10/30/21 0234  WBC 7.2 6.5  NEUTROABS 4.8  --   HGB 11.4* 9.9*  HCT 35.5* 31.4*  MCV 88.5 88.7  PLT 359 517    Basic Metabolic Panel: Recent Labs  Lab 10/29/21 1744 10/30/21 0234  NA 135 139  K 4.5 4.2  CL 107  111  CO2 17* 19*  GLUCOSE 98 117*  BUN 21* 17  CREATININE 1.08 0.88  CALCIUM 10.1 10.0   GFR: Estimated Creatinine Clearance: 93.6 mL/min (by C-G formula based on SCr of 0.88 mg/dL). Recent Labs  Lab 10/29/21 1744 10/29/21 2023 10/30/21 0234  PROCALCITON  --  0.37  --   WBC 7.2  --  6.5  LATICACIDVEN 1.3 1.3  --     Liver  Function Tests: Recent Labs  Lab 10/29/21 1744 10/30/21 0234  AST 97* 73*  ALT 116* 101*  ALKPHOS 310* 279*  BILITOT 0.6 0.5  PROT 8.4* 7.5  ALBUMIN 3.4* 3.1*   No results for input(s): LIPASE, AMYLASE in the last 168 hours. No results for input(s): AMMONIA in the last 168 hours.  ABG    Component Value Date/Time   PHART 7.46 (H) 10/29/2021 1756   PCO2ART 22 (L) 10/29/2021 1756   PO2ART 78 (L) 10/29/2021 1756   HCO3 15.6 (L) 10/29/2021 1756   ACIDBASEDEF 6.1 (H) 10/29/2021 1756   O2SAT 97.1 10/29/2021 1756     Coagulation Profile: Recent Labs  Lab 10/29/21 1744  INR 1.1    Cardiac Enzymes: No results for input(s): CKTOTAL, CKMB, CKMBINDEX, TROPONINI in the last 168 hours.  HbA1C: No results found for: HGBA1C  CBG: No results for input(s): GLUCAP in the last 168 hours.   Freda Jackson, MD [facility] Office: [phone]   See Amion for personal pager PCCM on call pager [phone] until 7pm. Please call Elink 7p-7a. [phone]

## 2021-10-30 NOTE — Assessment & Plan Note (Signed)
Chronic but remained stable.  Continue to monitor.

## 2021-10-30 NOTE — Assessment & Plan Note (Signed)
Has ambulatory PT referral from recent admission.

## 2021-10-30 NOTE — Progress Notes (Signed)
Echocardiogram 2D Echocardiogram has been performed.  Rodrigo Ran 10/30/2021, 9:41 AM

## 2021-10-30 NOTE — Assessment & Plan Note (Addendum)
Continue home Biktarvy, bactrim ethambutol.  Outpatient follow-up with ID -Last CD4 count on 5/10 of 46

## 2021-10-30 NOTE — Assessment & Plan Note (Signed)
Has mild AKI with creatinine of 1.08 from a prior of 0.82. -Continuous IV fluid overnight - Avoid nephrotoxic agent

## 2021-10-30 NOTE — Progress Notes (Signed)
ANTICOAGULATION CONSULT NOTE - Follow Up Consult  Pharmacy Consult for Heparin Indication: pulmonary embolus and DVT  Allergies  Allergen Reactions   Lidocaine Other (See Comments)    Large bruise at site of patch placement    Patient Measurements: Height: 5\' 6"  (167.6 cm) Weight: 68.9 kg (151 lb 14.4 oz) IBW/kg (Calculated) : 63.8 Heparin Dosing Weight: TBW  Vital Signs: Temp: 97.5 F (36.4 C) (05/21 0400) Temp Source: Oral (05/21 0400) BP: 113/90 (05/21 0600) Pulse Rate: 92 (05/21 0600)  Labs: Recent Labs    10/29/21 1744 10/29/21 2023 10/30/21 0234  HGB 11.4*  --  9.9*  HCT 35.5*  --  31.4*  PLT 359  --  310  APTT 37*  --   --   LABPROT 13.9  --   --   INR 1.1  --   --   HEPARINUNFRC  --   --  0.30  CREATININE 1.08  --  0.88  TROPONINIHS 8 7  --     Estimated Creatinine Clearance: 93.6 mL/min (by C-G formula based on SCr of 0.88 mg/dL).   Medications:  Infusions:   ceFEPime (MAXIPIME) IV Stopped (10/30/21 0201)   heparin 1,400 Units/hr (10/30/21 0600)   lactated ringers 150 mL/hr at 10/30/21 0600   vancomycin      Assessment: 45 yoM admitted on 5/20 with submassive PE and RLE DVT.  Pharmacy is consulted to dose heparin.  PMH significant for HIV and CVA (~7 months ago).  No noted prior to admission anticoagulation.  Today, 10/30/2021: Heparin level decreased to 0.28, subtherpatuetic on heparin 1400 units/hr CBC: Hgb decreased 11.4 > 9.9, but is near baseline of 9-10.  Plt WNL.  No bleeding or complications reported.    Goal of Therapy:  Heparin level 0.3-0.7 units/ml Monitor platelets by anticoagulation protocol: Yes   Plan:  Give heparin 1000 units bolus IV x 1 Increase to heparin IV infusion at 1500 units/hr Heparin level in 6 hours after rate change Daily heparin level and CBC Follow up long-term anticoagulation plans.  11/01/2021 PharmD, BCPS Clinical Pharmacist [facility] main pharmacy [phone] 10/30/2021 11:01 AM

## 2021-10-30 NOTE — H&P (Addendum)
History and Physical    Patient: Kurt Foley ZGY:[id number] DOB: 1975/07/03 DOA: 10/29/2021 DOS: the patient was seen and examined on 10/30/2021 PCP: Gildardo Pounds, NP  Patient coming from: Home  Chief Complaint:  Chief Complaint  Patient presents with   Shortness of Breath    se   Code Sepsis   HPI: Kurt Foley is a 46 y.o. male with medical history significant of for HIV/AIDS, PML, disseminated MAC, prior CVA with right residual weakness presenting for increasing shortness of breath. Reports symptoms of dyspnea both at rest and with exertion for the past week.  Also noticed 2 days of elevated temperature up to 100 F. No chest pain or cough.  No lower extremity edema.  He was just hospitalized from 4/27 to 4/28 after presenting with dyspnea and generalized weakness.  Had a CTA chest that was negative for acute PE at that time but revealed progressive masslike peripheral RUL opacity concerning for pneumonia or malignancy.  ID consulted and he underwent treatment for community-acquired pneumonia.  Pulmonology has plans to arrange navigational bronchoscopy outpatient after antibiotics.  In the ED, he was febrile up to 100.4 and tachycardic and tachypneic on room air.  No leukocytosis.  Has mild AKI with creatinine of 1.08 from a prior of 0.82.  LFTs remain elevated but stable.  CTA chest revealed bilateral pulmonary artery emboli with CT evidence of right heart strain consistent with at least submassive PE .  Similar appearance of bilateral hilar and mediastinal soft tissue mass Or adenopathy as well as bilateral upper lobe pulmonary nodule.  ED PA discussed case with critical care/pulmonology and had no recommendation for intervention at this time as he was hemodynamically stable.  Hospitalist then consult for admission.  Review of Systems: As mentioned in the history of present illness. All other systems reviewed and are negative. Past Medical History:  Diagnosis Date    Depression    HIV infection ([place])    No past surgical history on file. Social History:  reports that he has quit smoking. His smoking use included cigarettes. He has a 12.50 pack-year smoking history. He has never used smokeless tobacco. He reports that he does not currently use alcohol after a past usage of about 2.0 standard drinks per week. He reports that he does not currently use drugs after having used the following drugs: Marijuana and Cocaine.  Allergies  Allergen Reactions   Lidocaine Other (See Comments)    Large bruise at site of patch placement    Family History  Problem Relation Age of Onset   Rheum arthritis Mother    Diabetes Mother    Hypertension Mother    Arthritis Mother     Prior to Admission medications   Medication Sig Start Date End Date Taking? Authorizing Provider  acetaminophen (TYLENOL) 325 MG tablet Take 1-2 tablets (325-650 mg total) by mouth every 4 (four) hours as needed for mild pain. 08/05/21   Love, Ivan Anchors, PA-C  aspirin 81 MG chewable tablet Chew 1 tablet (81 mg total) by mouth daily. 08/05/21   Love, Ivan Anchors, PA-C  azithromycin (ZITHROMAX) 500 MG tablet Take 1 tablet by mouth at bedtime. 10/07/21   Mercy Riding, MD  bictegravir-emtricitabine-tenofovir AF (BIKTARVY) 50-200-25 MG TABS tablet Take 1 tablet by mouth daily. Patient taking differently: Take 1 tablet by mouth every morning. 08/05/21   Love, Ivan Anchors, PA-C  budesonide-formoterol (SYMBICORT) 160-4.5 MCG/ACT inhaler Inhale 2 puffs into the lungs in the morning and at bedtime.  10/07/21   Mercy Riding, MD  ethambutol (MYAMBUTOL) 400 MG tablet Take 2 tablets by mouth daily. 10/07/21   Mercy Riding, MD  fluconazole (DIFLUCAN) 200 MG tablet Take 1 tablet (200 mg total) by mouth daily for 13 days. 10/19/21 11/01/21  Thayer Headings, MD  loratadine (CLARITIN) 10 MG tablet Take 1 tablet (10 mg total) by mouth daily. Patient taking differently: Take 10 mg by mouth every morning. 09/14/21   Thayer Headings,  MD  megestrol (MEGACE) 400 MG/10ML suspension Take 10 mLs (400 mg total) by mouth daily. Patient taking differently: Take 400 mg by mouth every morning. 08/06/21   Love, Ivan Anchors, PA-C  metoprolol succinate (TOPROL-XL) 50 MG 24 hr tablet Take 1 tablet by mouth every morning. Take with or immediately following a meal. 10/07/21   Mercy Riding, MD  mirtazapine (REMERON) 15 MG tablet Take 1 tablet (15 mg total) by mouth at bedtime. 08/05/21   Love, Ivan Anchors, PA-C  Multiple Vitamins-Minerals (CERTAVITE/ANTIOXIDANTS) TABS Take 1 tablet by mouth daily. 08/05/21   Love, Ivan Anchors, PA-C  nystatin cream (MYCOSTATIN) Apply 1 application. topically 2 (two) times daily. 10/07/21   Mercy Riding, MD  Oxycodone HCl 10 MG TABS Take 0.5-1 tablets (5-10 mg total) by mouth every 6 (six) hours as needed for severe pain. Patient not taking: Reported on 10/19/2021 08/05/21   Love, Ivan Anchors, PA-C  pantoprazole (PROTONIX) 40 MG tablet Take 1 tablet (40 mg total) by mouth daily. 08/05/21   Love, Ivan Anchors, PA-C  polyethylene glycol (MIRALAX / GLYCOLAX) 17 g packet Take 17 g by mouth daily as needed for mild constipation or moderate constipation. 07/20/21   Pokhrel, Corrie Mckusick, MD  QUEtiapine (SEROQUEL) 200 MG tablet Take 1 tablet (200 mg total) by mouth at bedtime. 08/05/21   Love, Ivan Anchors, PA-C  rosuvastatin (CRESTOR) 5 MG tablet Take 1 tablet (5 mg total) by mouth daily. Patient taking differently: Take 5 mg by mouth every morning. 08/17/21   Gildardo Pounds, NP  senna-docusate (SENOKOT-S) 8.6-50 MG tablet Take 1 tablet by mouth 2 (two) times daily. 08/05/21   Love, Ivan Anchors, PA-C  sulfamethoxazole-trimethoprim (BACTRIM DS) 800-160 MG tablet Take 1 tablet by mouth daily. 10/07/21   Mercy Riding, MD  umeclidinium bromide (INCRUSE ELLIPTA) 62.5 MCG/ACT AEPB Inhale 1 puff into the lungs daily. 10/07/21   Mercy Riding, MD    Physical Exam: Vitals:   10/29/21 2317 10/30/21 0000 10/30/21 0100 10/30/21 0200  BP: 105/80 109/71 109/81 103/81   Pulse:  88 88 88  Resp: (!) 33 (!) 30 (!) 21 (!) 26  Temp:  98 F (36.7 C)    TempSrc:  Oral    SpO2:  100% 96% 92%  Weight:  68.9 kg    Height:       Constitutional: NAD, calm, comfortable, thin cachectic chronically ill-appearing male laying flat in bed Eyes: lids and conjunctivae normal ENMT: Mucous membranes are moist.  Neck: normal, supple Respiratory: clear to auscultation bilaterally, no wheezing, no crackles. Normal respiratory effort on room air. No accessory muscle use.  Cardiovascular: Regular rate and rhythm, no murmurs / rubs / gallops. No extremity edema.  Abdomen: no tenderness, no masses palpated. No hepatosplenomegaly. Bowel sounds positive.  Musculoskeletal: no clubbing / cyanosis. No joint deformity upper and lower extremities.  Muscle wasting on all extremities.  Skin: no rashes, lesions, ulcers.  Neurologic: CN 2-12 grossly intact.  Right upper extremity hemiparesis from previous stroke.  3 out of 5 strength of the right lower extremity.  Slow delayed speech. Psychiatric: Normal judgment and insight. Alert and oriented x 3. Normal mood. Data Reviewed:  See HPI  Assessment and Plan: * Pulmonary embolism ([place]) Presented with dyspnea with CTA chest revealing bilateral pulmonary emboli with evidence of right heart strain. - Has been evaluated by critical care and no fibrinolytics indicated at this time since he is hemodynamically stable -will Obtain echocardiogram and bilateral venous Doppler ultrasound -Continue on IV heparin infusion  Mass of upper lobe of right lung - This was found during recent admission several weeks ago.  There was plans for navigational bronchoscopy outpatient with pulmonology.  With new acute pulmonary embolism it does make the mass more concerning for malignancy.  Abnormal LFTs Chronic but remained stable.  Continue to monitor.  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  Outpatient follow-up with ID -Last CD4 count on 5/10 of 46  History of CVA with residual right hemiparesis Has ambulatory PT referral from recent admission.  Dyspnea Multifactorial from bilateral PE and suspected pneumonia -Continue IV vancomycin and cefepime. Continue home azithromycin. Procalcitonin around 0.37. - Sputum culture, urine Legionella and strep pneumo antigen is pending -recommend ID consult in the morning to assist with antibiotic coverage  AKI (acute kidney injury) ([place]) Has mild AKI with creatinine of 1.08 from a prior of 0.82. -Continuous IV fluid overnight - Avoid nephrotoxic agent      Advance Care Planning:   Code Status: Full Code   Consults: critical care  Family Communication: no family at bedside  Severity of Illness: The appropriate patient status for this patient is OBSERVATION. Observation status is judged to be reasonable and necessary in order to provide the required intensity of service to ensure the patient's safety. The patient's presenting symptoms, physical exam findings, and initial radiographic and laboratory data in the context of their medical condition is felt to place them at decreased risk for further clinical deterioration. Furthermore, it is anticipated that the patient will be medically stable for discharge from the hospital within 2 midnights of admission.   Author: Orene Desanctis, DO 10/30/2021 2:15 AM  For on call review [email or web address].

## 2021-10-30 NOTE — Progress Notes (Signed)
Pharmacy Consult Note - IV heparin follow up  Labs: heparin level 0.33  A/P: Heparin level now therapeutic (goal 0.3-0.7) for PE on current IV heparin rate of 1500 units/hr. Per RN, no issues or bleeding noted. Given patient with submassive PE and +DVT, would prefer for heparin level to be higher end of goal range. Increase IV heparin from 1500 units/hr to 1650 units/hr. Recheck heparin level 6 hours after rate increase  Hessie Knows, PharmD, BCPS Secure Chat if ?s 10/30/2021 7:22 PM

## 2021-10-30 NOTE — Progress Notes (Signed)
[facility] Progress Note Patient Name: Kurt Foley DOB: 03-21-1976 MRN: [id number]   Date of Service  10/30/2021  HPI/Events of Note  Respiratory panel is negative.  eICU Interventions  Isolation discontinued.        Kerry Kass Odeth Bry 10/30/2021, 3:27 AM

## 2021-10-30 NOTE — Assessment & Plan Note (Signed)
Presented with dyspnea with CTA chest revealing bilateral pulmonary emboli with evidence of right heart strain. - Has been evaluated by critical care and no fibrinolytics indicated at this time since he is hemodynamically stable -will Obtain echocardiogram and bilateral venous Doppler ultrasound -Continue on IV heparin infusion

## 2021-10-30 NOTE — Progress Notes (Signed)
eLink Physician-Brief Progress Note Patient Name: Kurt Foley DOB: [birth year] MRN: [id number]   Date of Service  10/30/2021  HPI/Events of Note  Patient with cough not relieved by Robitussin.  eICU Interventions  Tessalon pearls ordered.        Migdalia Dk 10/30/2021, 8:48 PM

## 2021-10-30 NOTE — Progress Notes (Addendum)
ANTICOAGULATION CONSULT NOTE - follow up  Pharmacy Consult for IV heparin Indication: pulmonary embolus  Allergies  Allergen Reactions   Lidocaine Other (See Comments)    Large bruise at site of patch placement    Patient Measurements: Height: 5\' 6"  (167.6 cm) Weight: 68.9 kg (151 lb 14.4 oz) IBW/kg (Calculated) : 63.8 Heparin Dosing Weight: 68 kg  Vital Signs: Temp: 98 F (36.7 C) (05/21 0000) Temp Source: Oral (05/21 0000) BP: 106/75 (05/21 0300) Pulse Rate: 88 (05/21 0300)  Labs: Recent Labs    10/29/21 1744 10/29/21 2023 10/30/21 0234  HGB 11.4*  --  9.9*  HCT 35.5*  --  31.4*  PLT 359  --  310  APTT 37*  --   --   LABPROT 13.9  --   --   INR 1.1  --   --   HEPARINUNFRC  --   --  0.30  CREATININE 1.08  --   --   TROPONINIHS 8 7  --      Estimated Creatinine Clearance: 76.3 mL/min (by C-G formula based on SCr of 1.08 mg/dL).   Medical History: Past Medical History:  Diagnosis Date   Depression    HIV infection ([place])     Medications:  Scheduled:   [START ON 10/31/2021] azithromycin  500 mg Oral QHS   bictegravir-emtricitabine-tenofovir AF  1 tablet Oral q morning   Chlorhexidine Gluconate Cloth  6 each Topical Daily   ethambutol  800 mg Oral Daily   [START ON 10/31/2021] sulfamethoxazole-trimethoprim  1 tablet Oral Daily   Infusions:   ceFEPime (MAXIPIME) IV Stopped (10/30/21 0201)   heparin 1,300 Units/hr (10/30/21 0201)   lactated ringers Stopped (10/30/21 0131)   vancomycin      Assessment: 46 yo presented with CC of shortness of breath has hx HIV and hx CVA approximately 7 months ago. Per CT, patient with bilateral PE with right sided heart strain to begin IV heparin. Baseline labs have been drawn already. Patient not on DOAC prior to admission  1st HL 0.3, low end of therapeutic on 1300 units/hr Hgb 9.9, plts 310 Per RN no bleeding   Goal of Therapy:  Heparin level 0.3-0.7 units/ml Monitor platelets by anticoagulation protocol: Yes    Plan:  Increase heparin drip to 1400 units/hr Check heparin level 6 hours  Daily CBC and HL  Dolly Rias RPh 10/30/2021, 3:28 AM

## 2021-10-30 NOTE — Assessment & Plan Note (Addendum)
Multifactorial from bilateral PE and suspected pneumonia -Continue IV vancomycin and cefepime. Continue home azithromycin. Procalcitonin around 0.37. - Sputum culture, urine Legionella and strep pneumo antigen is pending -recommend ID consult in the morning to assist with antibiotic coverage

## 2021-10-30 NOTE — Assessment & Plan Note (Signed)
Continue home ethambutol

## 2021-10-30 NOTE — Progress Notes (Signed)
PROGRESS NOTE    Kurt Foley  YWV:[id number] DOB: Apr 22, 1976 DOA: 10/29/2021 PCP: Gildardo Pounds, NP   Brief Narrative:  46 year old with history of HIV/AIDS, PML, disseminated MAC, prior CVA with residual right-sided weakness admitted for shortness of breath.  He had CTA done last month for shortness of breath as well and was negative for PE but showed progression of masslike opacity in the right upper lobe and was also treated for community-acquired pneumonia with plans of navigational bronchoscopy as outpatient.  During this admission CTA revealed bilateral pulmonary embolism with cor pulmonale, pulmonary team was consulted and he was started on anticoagulation.   Assessment & Plan:  Principal Problem:   Pulmonary embolism (HCC) Active Problems:   Mass of upper lobe of right lung   Human immunodeficiency virus (HIV) disease (HCC)   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HCC)   Abnormal LFTs   History of CVA with residual right hemiparesis   Dyspnea   AKI (acute kidney injury) ([place])     Assessment and Plan: * Pulmonary embolism ([place]) with cor pulmonale Presented with dyspnea with CTA chest revealing bilateral pulmonary emboli with evidence of right heart strain. Patient has been seen by critical care team.  No indication for fibrinolytics at this time.  Continue IV heparin with plans to switch to oral when able Echocardiogram Lower extremity Dopplers  Mass of upper lobe of right lung -This was diagnosed several weeks ago and outpatient navigational bronchoscopy was planned due to concerns of malignancy.  Pulmonary team has been consulted at this time.  Abnormal LFTs Continue to monitor for now  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[place]) On ethambutol  Human immunodeficiency virus (HIV) disease ([place]) Continue home Biktarvy, bactrim ethambutol.  Follows outpatient infectious disease -Last CD4 count on 5/10 of 46  History of CVA with  residual right hemiparesis Has ambulatory PT referral from recent admission.  Dyspnea Secondary to pulmonary embolism.  Unequivocal procalcitonin, stop IV antibiotics.  Follow culture data.  AKI (acute kidney injury) ([place]), resolved Baseline 0.8, admission 1.08.  This is not resolved.     DVT prophylaxis: Heparin drip Code Status: Full code Family Communication:    Patient is still quite dyspneic, ongoing evaluation for pulmonary embolism with cor pulmonale.  Maintain hospital stay.   Subjective:  Seen and examined at bedside, breathing is improved compared to yesterday.  He has not really ambulated so does not know about exertional shortness of breath or chest pain.   Examination:  General exam: Appears calm and comfortable  Respiratory system: Clear to auscultation. Respiratory effort normal. Cardiovascular system: S1 & S2 heard, RRR. No JVD, murmurs, rubs, gallops or clicks. No pedal edema. Gastrointestinal system: Abdomen is nondistended, soft and nontender. No organomegaly or masses felt. Normal bowel sounds heard. Central nervous system: Alert and oriented. No focal neurological deficits. Extremities: Symmetric 5 x 5 power. Skin: No rashes, lesions or ulcers Psychiatry: Judgement and insight appear normal. Mood & affect appropriate.     Objective: Vitals:   10/30/21 0300 10/30/21 0400 10/30/21 0500 10/30/21 0600  BP: 106/75 98/74 115/80 113/90  Pulse: 88 93 92 92  Resp: (!) 29 (!) 26 (!) 26 (!) 31  Temp:  (!) 97.5 F (36.4 C)    TempSrc:  Oral    SpO2: 93% 99% 94% 98%  Weight:      Height:        Intake/Output Summary (Last 24 hours) at 10/30/2021 0738 Last data filed at 10/30/2021 0600 Gross per 24 hour  Intake 1721.91 ml  Output 1490 ml  Net 231.91 ml   Filed Weights   10/29/21 1734 10/30/21 0000  Weight: 68 kg 68.9 kg     Data Reviewed:   CBC: Recent Labs  Lab 10/29/21 1744 10/30/21 0234  WBC 7.2 6.5  NEUTROABS 4.8  --   HGB 11.4* 9.9*   HCT 35.5* 31.4*  MCV 88.5 88.7  PLT 359 329   Basic Metabolic Panel: Recent Labs  Lab 10/29/21 1744 10/30/21 0234  NA 135 139  K 4.5 4.2  CL 107 111  CO2 17* 19*  GLUCOSE 98 117*  BUN 21* 17  CREATININE 1.08 0.88  CALCIUM 10.1 10.0   GFR: Estimated Creatinine Clearance: 93.6 mL/min (by C-G formula based on SCr of 0.88 mg/dL). Liver Function Tests: Recent Labs  Lab 10/29/21 1744 10/30/21 0234  AST 97* 73*  ALT 116* 101*  ALKPHOS 310* 279*  BILITOT 0.6 0.5  PROT 8.4* 7.5  ALBUMIN 3.4* 3.1*   No results for input(s): LIPASE, AMYLASE in the last 168 hours. No results for input(s): AMMONIA in the last 168 hours. Coagulation Profile: Recent Labs  Lab 10/29/21 1744  INR 1.1   Cardiac Enzymes: No results for input(s): CKTOTAL, CKMB, CKMBINDEX, TROPONINI in the last 168 hours. BNP (last 3 results) No results for input(s): PROBNP in the last 8760 hours. HbA1C: No results for input(s): HGBA1C in the last 72 hours. CBG: No results for input(s): GLUCAP in the last 168 hours. Lipid Profile: No results for input(s): CHOL, HDL, LDLCALC, TRIG, CHOLHDL, LDLDIRECT in the last 72 hours. Thyroid Function Tests: No results for input(s): TSH, T4TOTAL, FREET4, T3FREE, THYROIDAB in the last 72 hours. Anemia Panel: No results for input(s): VITAMINB12, FOLATE, FERRITIN, TIBC, IRON, RETICCTPCT in the last 72 hours. Sepsis Labs: Recent Labs  Lab 10/29/21 1744 10/29/21 2023  PROCALCITON  --  0.37  LATICACIDVEN 1.3 1.3    Recent Results (from the past 240 hour(s))  Resp Panel by RT-PCR (Flu A&B, Covid)     Status: None   Collection Time: 10/29/21  6:24 PM  Result Value Ref Range Status   SARS Coronavirus 2 by RT PCR NEGATIVE NEGATIVE Final    Comment: (NOTE) SARS-CoV-2 target nucleic acids are NOT DETECTED.  The SARS-CoV-2 RNA is generally detectable in upper respiratory specimens during the acute phase of infection. The lowest concentration of SARS-CoV-2 viral copies this  assay can detect is 138 copies/mL. A negative result does not preclude SARS-Cov-2 infection and should not be used as the sole basis for treatment or other patient management decisions. A negative result may occur with  improper specimen collection/handling, submission of specimen other than nasopharyngeal swab, presence of viral mutation(s) within the areas targeted by this assay, and inadequate number of viral copies(<138 copies/mL). A negative result must be combined with clinical observations, patient history, and epidemiological information. The expected result is Negative.  Fact Sheet for Patients:  EntrepreneurPulse.com.au  Fact Sheet for Healthcare Providers:  IncredibleEmployment.be  This test is no t yet approved or cleared by the [place] FDA and  has been authorized for detection and/or diagnosis of SARS-CoV-2 by FDA under an Emergency Use Authorization (EUA). This EUA will remain  in effect (meaning this test can be used) for the duration of the COVID-19 declaration under Section 564(b)(1) of the Act, 21 U.S.C.section 360bbb-3(b)(1), unless the authorization is terminated  or revoked sooner.       Influenza A by PCR NEGATIVE NEGATIVE Final   Influenza  B by PCR NEGATIVE NEGATIVE Final    Comment: (NOTE) The Xpert Xpress SARS-CoV-2/FLU/RSV plus assay is intended as an aid in the diagnosis of influenza from Nasopharyngeal swab specimens and should not be used as a sole basis for treatment. Nasal washings and aspirates are unacceptable for Xpert Xpress SARS-CoV-2/FLU/RSV testing.  Fact Sheet for Patients: EntrepreneurPulse.com.au  Fact Sheet for Healthcare Providers: IncredibleEmployment.be  This test is not yet approved or cleared by the [place] FDA and has been authorized for detection and/or diagnosis of SARS-CoV-2 by FDA under an Emergency Use Authorization (EUA). This EUA will  remain in effect (meaning this test can be used) for the duration of the COVID-19 declaration under Section 564(b)(1) of the Act, 21 U.S.C. section 360bbb-3(b)(1), unless the authorization is terminated or revoked.  Performed at [facility], [place] [address] [place], [place] [address]   MRSA Next Gen by PCR, Nasal     Status: None   Collection Time: 10/29/21 11:14 PM   Specimen: Nasal Mucosa; Nasal Swab  Result Value Ref Range Status   MRSA by PCR Next Gen NOT DETECTED NOT DETECTED Final    Comment: (NOTE) The GeneXpert MRSA Assay (FDA approved for NASAL specimens only), is one component of a comprehensive MRSA colonization surveillance program. It is not intended to diagnose MRSA infection nor to guide or monitor treatment for MRSA infections. Test performance is not FDA approved in patients less than 58 years old. Performed at [facility], [place] [address], [place], [place] [address]          Radiology Studies: CT Angio Chest PE W and/or Wo Contrast  Result Date: 10/29/2021 CLINICAL DATA:  Concern for pulmonary embolism. EXAM: CT ANGIOGRAPHY CHEST WITH CONTRAST TECHNIQUE: Multidetector CT imaging of the chest was performed using the standard protocol during bolus administration of intravenous contrast. Multiplanar CT image reconstructions and MIPs were obtained to evaluate the vascular anatomy. RADIATION DOSE REDUCTION: This exam was performed according to the departmental dose-optimization program which includes automated exposure control, adjustment of the mA and/or kV according to patient size and/or use of iterative reconstruction technique. CONTRAST:  26m OMNIPAQUE IOHEXOL 350 MG/ML SOLN COMPARISON:  Chest CT dated 10/06/2021 and radiograph dated 10/29/2021. FINDINGS: Evaluation of this exam is limited due to respiratory motion artifact. Cardiovascular: There is no cardiomegaly or pericardial effusion. There is coronary vascular  calcification, advanced for the patient's age. Mild atherosclerotic calcification of the thoracic aorta. No aneurysmal dilatation or dissection. Evaluation of the pulmonary arteries is limited due to respiratory motion artifact. Bilateral pulmonary artery emboli involving the lobar branches of the right lower lobe extending into the segmental branches as well as in the left upper lobar branch. The right ventricular lumen measures approximately 4.0 cm and the left ventricular lumen measures 2.8 cm with RV/LV ratio of 1.4 consistent with right heart straining. Correlation with clinical exam and echocardiogram recommended. Mediastinum/Nodes: Similar appearance of bilateral hilar and mediastinal soft tissue masses or adenopathy measuring approximately 2 x 4 cm in the prevascular space. Subcarinal lymph node measures 16 mm in short axis. The esophagus is grossly unremarkable. No mediastinal fluid collection. Lungs/Pleura: Background of moderate emphysema. Similar appearance of right upper lobe subpleural masslike density measuring 3.3 x 1.4 cm as well as a 9 mm nodular density in the left upper lobe, unchanged since the study of 10/06/2021. Left lung base linear atelectasis/scarring. No new consolidation. There is no pleural effusion or pneumothorax. The central airways are patent. Upper Abdomen: No  acute abnormality. Musculoskeletal: No chest wall abnormality. No acute or significant osseous findings. Review of the MIP images confirms the above findings. IMPRESSION: 1. Bilateral pulmonary artery emboli with CT evidence of right heart straining (RV/LV Ratio = 1.4) consistent with at least submassive (intermediate risk) PE. The presence of right heart strain has been associated with an increased risk of morbidity and mortality. 2. Similar appearance of bilateral hilar and mediastinal soft tissue masses or adenopathy as well as bilateral upper lobe pulmonary nodules. 3. Coronary vascular calcification, advanced for the  patient's age. 4. Aortic Atherosclerosis (ICD10-I70.0) and Emphysema (ICD10-J43.9). These results were called by telephone at the time of interpretation on 10/29/2021 at 7:28 pm to provider Theodis Blaze , who verbally acknowledged these results. Electronically Signed   By: Anner Crete M.D.   On: 10/29/2021 19:28   DG Chest Port 1 View  Result Date: 10/29/2021 CLINICAL DATA:  Questionable sepsis. EXAM: PORTABLE CHEST 1 VIEW COMPARISON:  Chest x-ray 10/06/2021.  CT chest 10/06/2021. FINDINGS: The heart size and mediastinal contours are within normal limits. Focal opacity in the right upper lobe appears unchanged. Lungs are otherwise clear. The visualized skeletal structures are unremarkable. IMPRESSION: Stable exam.  No acute cardiopulmonary process. Electronically Signed   By: Ronney Asters M.D.   On: 10/29/2021 18:37        Scheduled Meds:  [START ON 10/31/2021] azithromycin  500 mg Oral QHS   bictegravir-emtricitabine-tenofovir AF  1 tablet Oral q morning   Chlorhexidine Gluconate Cloth  6 each Topical Daily   ethambutol  800 mg Oral Daily   [START ON 10/31/2021] sulfamethoxazole-trimethoprim  1 tablet Oral Daily   Continuous Infusions:  ceFEPime (MAXIPIME) IV Stopped (10/30/21 0201)   heparin 1,400 Units/hr (10/30/21 0600)   lactated ringers 150 mL/hr at 10/30/21 0600   vancomycin       LOS: 0 days   Time spent= 35 mins    Sherise Geerdes Arsenio Loader, MD [facility]  If 7PM-7AM, please contact night-coverage  10/30/2021, 7:38 AM

## 2021-10-30 NOTE — Progress Notes (Signed)
PHARMACY - PHYSICIAN COMMUNICATION CRITICAL VALUE ALERT - BLOOD CULTURE IDENTIFICATION (BCID)  Kurt Foley is an 46 y.o. male who presented to [facility] on 10/29/2021 with a chief complaint of PE  Assessment:  1/4 staph epi - can presume contaminant  Name of physician (or Provider) Contacted: Hal Hope  Current antibiotics: zithromax for MAC ppx  Changes to prescribed antibiotics recommended:  Abx not recommended  Results for orders placed or performed during the hospital encounter of 10/29/21  Blood Culture ID Panel (Reflexed) (Collected: 10/29/2021  5:44 PM)  Result Value Ref Range   Enterococcus faecalis NOT DETECTED NOT DETECTED   Enterococcus Faecium NOT DETECTED NOT DETECTED   Listeria monocytogenes NOT DETECTED NOT DETECTED   Staphylococcus species DETECTED (A) NOT DETECTED   Staphylococcus aureus (BCID) NOT DETECTED NOT DETECTED   Staphylococcus epidermidis DETECTED (A) NOT DETECTED   Staphylococcus lugdunensis NOT DETECTED NOT DETECTED   Streptococcus species NOT DETECTED NOT DETECTED   Streptococcus agalactiae NOT DETECTED NOT DETECTED   Streptococcus pneumoniae NOT DETECTED NOT DETECTED   Streptococcus pyogenes NOT DETECTED NOT DETECTED   A.calcoaceticus-baumannii NOT DETECTED NOT DETECTED   Bacteroides fragilis NOT DETECTED NOT DETECTED   Enterobacterales NOT DETECTED NOT DETECTED   Enterobacter cloacae complex NOT DETECTED NOT DETECTED   Escherichia coli NOT DETECTED NOT DETECTED   Klebsiella aerogenes NOT DETECTED NOT DETECTED   Klebsiella oxytoca NOT DETECTED NOT DETECTED   Klebsiella pneumoniae NOT DETECTED NOT DETECTED   Proteus species NOT DETECTED NOT DETECTED   Salmonella species NOT DETECTED NOT DETECTED   Serratia marcescens NOT DETECTED NOT DETECTED   Haemophilus influenzae NOT DETECTED NOT DETECTED   Neisseria meningitidis NOT DETECTED NOT DETECTED   Pseudomonas aeruginosa NOT DETECTED NOT DETECTED   Stenotrophomonas maltophilia NOT DETECTED  NOT DETECTED   Candida albicans NOT DETECTED NOT DETECTED   Candida auris NOT DETECTED NOT DETECTED   Candida glabrata NOT DETECTED NOT DETECTED   Candida krusei NOT DETECTED NOT DETECTED   Candida parapsilosis NOT DETECTED NOT DETECTED   Candida tropicalis NOT DETECTED NOT DETECTED   Cryptococcus neoformans/gattii NOT DETECTED NOT DETECTED   Methicillin resistance mecA/C DETECTED (A) NOT DETECTED    Kara Mead 10/30/2021  7:54 PM

## 2021-10-30 NOTE — Progress Notes (Signed)
Pharmacy Antibiotic Note  Kurt Foley is a 46 y.o. male admitted on 10/29/2021 with SOB, CT scan showing submassive PE. Pt with history of HIV.  Pharmacy has been consulted to dose vancomycin and cefepime for PNA. 1st doses given in the ED  Plan: Vancomycin 1750 mg IV q24h (AUC 520.8, Scr 1.08) Cefepime 2gm IV q8h Follow renal function, cultures and clinical course  Height: 5\' 6"  (167.6 cm) Weight: 68.9 kg (151 lb 14.4 oz) IBW/kg (Calculated) : 63.8  Temp (24hrs), Avg:98.6 F (37 C), Min:97.8 F (36.6 C), Max:100.4 F (38 C)  Recent Labs  Lab 10/29/21 1744 10/29/21 2023  WBC 7.2  --   CREATININE 1.08  --   LATICACIDVEN 1.3 1.3    Estimated Creatinine Clearance: 76.3 mL/min (by C-G formula based on SCr of 1.08 mg/dL).    Allergies  Allergen Reactions   Lidocaine Other (See Comments)    Large bruise at site of patch placement    Antimicrobials this admission: Vanc 5/20 >> Cefepime 5/20 >> Azith 5/20 x 1   Dose adjustments this admission:   Microbiology results: 5/20 BCx:  5/20 UCx:   5/20 Sputum:   5/20 MRSA PCR: neg  Thank you for allowing pharmacy to be a part of this patient's care.  Dolly Rias RPh 10/30/2021, 1:09 AM

## 2021-10-31 ENCOUNTER — Telehealth: Payer: Self-pay | Admitting: Pulmonary Disease

## 2021-10-31 ENCOUNTER — Ambulatory Visit: Payer: Self-pay | Admitting: Pulmonary Disease

## 2021-10-31 DIAGNOSIS — Z8673 Personal history of transient ischemic attack (TIA), and cerebral infarction without residual deficits: Secondary | ICD-10-CM | POA: Diagnosis not present

## 2021-10-31 DIAGNOSIS — I2699 Other pulmonary embolism without acute cor pulmonale: Secondary | ICD-10-CM | POA: Diagnosis not present

## 2021-10-31 DIAGNOSIS — B20 Human immunodeficiency virus [HIV] disease: Secondary | ICD-10-CM | POA: Diagnosis not present

## 2021-10-31 DIAGNOSIS — A310 Pulmonary mycobacterial infection: Secondary | ICD-10-CM | POA: Diagnosis not present

## 2021-10-31 LAB — MAGNESIUM: Magnesium: 1.6 mg/dL — ABNORMAL LOW (ref 1.7–2.4)

## 2021-10-31 LAB — CULTURE, BLOOD (ROUTINE X 2): Special Requests: ADEQUATE

## 2021-10-31 LAB — BASIC METABOLIC PANEL
Anion gap: 10 (ref 5–15)
BUN: 13 mg/dL (ref 6–20)
CO2: 18 mmol/L — ABNORMAL LOW (ref 22–32)
Calcium: 9.7 mg/dL (ref 8.9–10.3)
Chloride: 107 mmol/L (ref 98–111)
Creatinine, Ser: 0.98 mg/dL (ref 0.61–1.24)
GFR, Estimated: >60 mL/min (ref >60)
Glucose, Bld: 139 mg/dL — ABNORMAL HIGH (ref 70–99)
Potassium: 4 mmol/L (ref 3.5–5.1)
Sodium: 135 mmol/L (ref 135–145)

## 2021-10-31 LAB — LEGIONELLA PNEUMOPHILA SEROGP 1 UR AG: L. pneumophila Serogp 1 Ur Ag: NEGATIVE

## 2021-10-31 LAB — CBC
HCT: 30.5 % — ABNORMAL LOW (ref 39.0–52.0)
Hemoglobin: 10 g/dL — ABNORMAL LOW (ref 13.0–17.0)
MCH: 28.4 pg (ref 26.0–34.0)
MCHC: 32.8 g/dL (ref 30.0–36.0)
MCV: 86.6 fL (ref 80.0–100.0)
Platelets: 286 10*3/uL (ref 150–400)
RBC: 3.52 MIL/uL — ABNORMAL LOW (ref 4.22–5.81)
RDW: 16.6 % — ABNORMAL HIGH (ref 11.5–15.5)
WBC: 5.5 10*3/uL (ref 4.0–10.5)
nRBC: 0 % (ref 0.0–0.2)

## 2021-10-31 LAB — HEPARIN LEVEL (UNFRACTIONATED)
Heparin Unfractionated: 0.39 IU/mL (ref 0.30–0.70)
Heparin Unfractionated: 0.48 IU/mL (ref 0.30–0.70)

## 2021-10-31 LAB — URINE CULTURE: Culture: NO GROWTH

## 2021-10-31 MED ORDER — RIVAROXABAN 15 MG PO TABS
15.0000 mg | ORAL_TABLET | Freq: Two times a day (BID) | ORAL | Status: DC
  Administered 2021-10-31 – 2021-11-02 (×5): 15 mg via ORAL
  Filled 2021-10-31 (×5): qty 1

## 2021-10-31 MED ORDER — PNEUMOCOCCAL VAC POLYVALENT 25 MCG/0.5ML IJ INJ
0.5000 mL | INJECTION | INTRAMUSCULAR | Status: DC
  Filled 2021-10-31 (×2): qty 0.5

## 2021-10-31 MED ORDER — LACTATED RINGERS IV SOLN: INTRAVENOUS | Status: AC

## 2021-10-31 MED ORDER — MAGNESIUM SULFATE 2 GM/50ML IV SOLN
2.0000 g | Freq: Once | INTRAVENOUS | Status: AC
  Administered 2021-10-31: 2 g via INTRAVENOUS
  Filled 2021-10-31: qty 50

## 2021-10-31 MED ORDER — RIVAROXABAN 20 MG PO TABS
20.0000 mg | ORAL_TABLET | Freq: Every day | ORAL | Status: DC
Start: 2021-11-21 — End: 2021-11-02

## 2021-10-31 NOTE — Telephone Encounter (Signed)
Patient is currently admitted and scheduled to see Dr. Tonia Brooms today 5/22 at 2pm. Please re-schedule this appointment in 3 weeks for follow up of RUL mass and discussing bronchoscopy.  Thanks, Melody Comas, MD [facility] Office: [phone]

## 2021-10-31 NOTE — Progress Notes (Signed)
PROGRESS NOTE    Kurt Foley  VOZ:[id number] DOB: [birth year] DOA: 10/29/2021 PCP: Gildardo Pounds, NP   Brief Narrative:  46 year old with history of HIV/AIDS, PML, disseminated MAC, prior CVA with residual right-sided weakness admitted for shortness of breath.  He had CTA done last month for shortness of breath as well and was negative for PE but showed progression of masslike opacity in the right upper lobe and was also treated for community-acquired pneumonia with plans of navigational bronchoscopy as outpatient.  During this admission CTA revealed bilateral pulmonary embolism with cor pulmonale, pulmonary team was consulted and he was started on anticoagulation.   Assessment & Plan:  Principal Problem:   Pulmonary embolism (HCC) Active Problems:   Mass of upper lobe of right lung   Human immunodeficiency virus (HIV) disease (HCC)   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HCC)   Abnormal LFTs   History of CVA with residual right hemiparesis   Dyspnea   AKI (acute kidney injury) ([place])     Assessment and Plan: * Pulmonary embolism ([place]) without cor pulmonale Right lower extremity DVT Presented with dyspnea with CTA chest revealing bilateral pulmonary emboli with evidence of right heart strain. Patient has been seen by critical care team.  No indication for fibrinolytics at this time.  Continue IV heparin with plans to switch to oral when able Echocardiogram-Echo shows 55%, grade 1 DD, normal RV function Lower extremity Dopplers-right lower extremity DVT Procalcitonin unequivocal-we will stop antibiotics  Mass of upper lobe of right lung - Seen by pulmonary, outpatient bronchoscopy will need to be rescheduled.  Abnormal LFTs Continue to monitor for now  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[place]) On ethambutol  Human immunodeficiency virus (HIV) disease ([place]) Continue home Biktarvy, bactrim ethambutol.  Follows outpatient infectious  disease -Last CD4 count on 5/10 of 46  History of CVA with residual right hemiparesis Has ambulatory PT referral from recent admission.  Dyspnea Secondary to pulmonary embolism.  Unequivocal procalcitonin, stop IV antibiotics.  Follow culture data.  AKI (acute kidney injury) ([place]), resolved Baseline 0.8, admission 1.08.  This is not resolved.     DVT prophylaxis: Heparin drip Code Status: Full code Family Communication:    Patient still remains dyspneic and tachycardic with ambulation and at rest.  Maintain hospital stay.  Subjective: Denies any chest pain and shortness of breath at rest but he still tachycardic with heart rate in 120s.  Examination:  Constitutional: Not in acute distress Respiratory: Clear to auscultation bilaterally Cardiovascular: Sinus tachycardia, no rubs Abdomen: Nontender nondistended good bowel sounds Musculoskeletal: No edema noted Skin: No rashes seen Neurologic: CN 2-12 grossly intact.  And nonfocal Psychiatric: Normal judgment and insight. Alert and oriented x 3. Normal mood.  Objective: Vitals:   10/31/21 0440 10/31/21 0500 10/31/21 0600 10/31/21 0632  BP:   106/71   Pulse: (!) 117  (!) 122   Resp:  (!) 21 (!) 32   Temp:    99.1 F (37.3 C)  TempSrc:    Oral  SpO2:   95%   Weight:      Height:        Intake/Output Summary (Last 24 hours) at 10/31/2021 0709 Last data filed at 10/31/2021 0600 Gross per 24 hour  Intake 2443.54 ml  Output 2500 ml  Net -56.46 ml   Filed Weights   10/29/21 1734 10/30/21 0000  Weight: 68 kg 68.9 kg     Data Reviewed:   CBC: Recent Labs  Lab 10/29/21 1744 10/30/21 0234 10/31/21 0240  WBC 7.2 6.5 5.5  NEUTROABS 4.8  --   --   HGB 11.4* 9.9* 10.0*  HCT 35.5* 31.4* 30.5*  MCV 88.5 88.7 86.6  PLT 359 310 017   Basic Metabolic Panel: Recent Labs  Lab 10/29/21 1744 10/30/21 0234 10/31/21 0240  NA 135 139 135  K 4.5 4.2 4.0  CL 107 111 107  CO2 17* 19* 18*  GLUCOSE 98 117* 139*  BUN  21* 17 13  CREATININE 1.08 0.88 0.98  CALCIUM 10.1 10.0 9.7  MG  --   --  1.6*   GFR: Estimated Creatinine Clearance: 84.1 mL/min (by C-G formula based on SCr of 0.98 mg/dL). Liver Function Tests: Recent Labs  Lab 10/29/21 1744 10/30/21 0234  AST 97* 73*  ALT 116* 101*  ALKPHOS 310* 279*  BILITOT 0.6 0.5  PROT 8.4* 7.5  ALBUMIN 3.4* 3.1*   No results for input(s): LIPASE, AMYLASE in the last 168 hours. No results for input(s): AMMONIA in the last 168 hours. Coagulation Profile: Recent Labs  Lab 10/29/21 1744  INR 1.1   Cardiac Enzymes: No results for input(s): CKTOTAL, CKMB, CKMBINDEX, TROPONINI in the last 168 hours. BNP (last 3 results) No results for input(s): PROBNP in the last 8760 hours. HbA1C: No results for input(s): HGBA1C in the last 72 hours. CBG: No results for input(s): GLUCAP in the last 168 hours. Lipid Profile: No results for input(s): CHOL, HDL, LDLCALC, TRIG, CHOLHDL, LDLDIRECT in the last 72 hours. Thyroid Function Tests: No results for input(s): TSH, T4TOTAL, FREET4, T3FREE, THYROIDAB in the last 72 hours. Anemia Panel: No results for input(s): VITAMINB12, FOLATE, FERRITIN, TIBC, IRON, RETICCTPCT in the last 72 hours. Sepsis Labs: Recent Labs  Lab 10/29/21 1744 10/29/21 2023  PROCALCITON  --  0.37  LATICACIDVEN 1.3 1.3    Recent Results (from the past 240 hour(s))  Blood Culture (routine x 2)     Status: None (Preliminary result)   Collection Time: 10/29/21  5:44 PM   Specimen: BLOOD  Result Value Ref Range Status   Specimen Description   Final    BLOOD BLOOD LEFT HAND Performed at [facility], [place] [address], [place], [place] [address]    Special Requests   Final    BOTTLES DRAWN AEROBIC AND ANAEROBIC Blood Culture adequate volume Performed at [place] [address], [place], [place] [address]    Culture  Setup Time   Final    GRAM POSITIVE COCCI IN CLUSTERS AEROBIC BOTTLE ONLY CRITICAL  RESULT CALLED TO, READ BACK BY AND VERIFIED WITH: PHARMD J.LEGGE AT 1952 ON 10/30/2021 BY T.SAAD. Performed at [facility], [place] [address] [place], [place] [address]    Culture GRAM POSITIVE COCCI  Final   Report Status PENDING  Incomplete  Blood Culture ID Panel (Reflexed)     Status: Abnormal   Collection Time: 10/29/21  5:44 PM  Result Value Ref Range Status   Enterococcus faecalis NOT DETECTED NOT DETECTED Final   Enterococcus Faecium NOT DETECTED NOT DETECTED Final   Listeria monocytogenes NOT DETECTED NOT DETECTED Final   Staphylococcus species DETECTED (A) NOT DETECTED Final    Comment: CRITICAL RESULT CALLED TO, READ BACK BY AND VERIFIED WITH: PHARMD J.LEGGE AT 1952 ON 10/30/2021 BY T.SAAD.    Staphylococcus aureus (BCID) NOT DETECTED NOT DETECTED Final   Staphylococcus epidermidis DETECTED (A) NOT DETECTED Final    Comment: Methicillin (oxacillin) resistant coagulase negative staphylococcus. Possible blood culture contaminant (unless isolated from  more than one blood culture draw or clinical case suggests pathogenicity). No antibiotic treatment is indicated for blood  culture contaminants. CRITICAL RESULT CALLED TO, READ BACK BY AND VERIFIED WITH: PHARMD J.LEGGE AT 1952 ON 10/30/2021 BY T.SAAD.    Staphylococcus lugdunensis NOT DETECTED NOT DETECTED Final   Streptococcus species NOT DETECTED NOT DETECTED Final   Streptococcus agalactiae NOT DETECTED NOT DETECTED Final   Streptococcus pneumoniae NOT DETECTED NOT DETECTED Final   Streptococcus pyogenes NOT DETECTED NOT DETECTED Final   A.calcoaceticus-baumannii NOT DETECTED NOT DETECTED Final   Bacteroides fragilis NOT DETECTED NOT DETECTED Final   Enterobacterales NOT DETECTED NOT DETECTED Final   Enterobacter cloacae complex NOT DETECTED NOT DETECTED Final   Escherichia coli NOT DETECTED NOT DETECTED Final   Klebsiella aerogenes NOT DETECTED NOT DETECTED Final   Klebsiella oxytoca NOT DETECTED NOT DETECTED Final    Klebsiella pneumoniae NOT DETECTED NOT DETECTED Final   Proteus species NOT DETECTED NOT DETECTED Final   Salmonella species NOT DETECTED NOT DETECTED Final   Serratia marcescens NOT DETECTED NOT DETECTED Final   Haemophilus influenzae NOT DETECTED NOT DETECTED Final   Neisseria meningitidis NOT DETECTED NOT DETECTED Final   Pseudomonas aeruginosa NOT DETECTED NOT DETECTED Final   Stenotrophomonas maltophilia NOT DETECTED NOT DETECTED Final   Candida albicans NOT DETECTED NOT DETECTED Final   Candida auris NOT DETECTED NOT DETECTED Final   Candida glabrata NOT DETECTED NOT DETECTED Final   Candida krusei NOT DETECTED NOT DETECTED Final   Candida parapsilosis NOT DETECTED NOT DETECTED Final   Candida tropicalis NOT DETECTED NOT DETECTED Final   Cryptococcus neoformans/gattii NOT DETECTED NOT DETECTED Final   Methicillin resistance mecA/C DETECTED (A) NOT DETECTED Final    Comment: CRITICAL RESULT CALLED TO, READ BACK BY AND VERIFIED WITH: PHARMD J.LEGGE AT 1952 ON 10/30/2021 BY T.SAAD. Performed at [facility], [place] [address], [place], [place] [address]   Resp Panel by RT-PCR (Flu A&B, Covid)     Status: None   Collection Time: 10/29/21  6:24 PM  Result Value Ref Range Status   SARS Coronavirus 2 by RT PCR NEGATIVE NEGATIVE Final    Comment: (NOTE) SARS-CoV-2 target nucleic acids are NOT DETECTED.  The SARS-CoV-2 RNA is generally detectable in upper respiratory specimens during the acute phase of infection. The lowest concentration of SARS-CoV-2 viral copies this assay can detect is 138 copies/mL. A negative result does not preclude SARS-Cov-2 infection and should not be used as the sole basis for treatment or other patient management decisions. A negative result may occur with  improper specimen collection/handling, submission of specimen other than nasopharyngeal swab, presence of viral mutation(s) within the areas targeted by this assay, and inadequate number of  viral copies(<138 copies/mL). A negative result must be combined with clinical observations, patient history, and epidemiological information. The expected result is Negative.  Fact Sheet for Patients:  EntrepreneurPulse.com.au  Fact Sheet for Healthcare Providers:  IncredibleEmployment.be  This test is no t yet approved or cleared by the [place] FDA and  has been authorized for detection and/or diagnosis of SARS-CoV-2 by FDA under an Emergency Use Authorization (EUA). This EUA will remain  in effect (meaning this test can be used) for the duration of the COVID-19 declaration under Section 564(b)(1) of the Act, 21 U.S.C.section 360bbb-3(b)(1), unless the authorization is terminated  or revoked sooner.       Influenza A by PCR NEGATIVE NEGATIVE Final   Influenza B by PCR NEGATIVE NEGATIVE Final  Comment: (NOTE) The Xpert Xpress SARS-CoV-2/FLU/RSV plus assay is intended as an aid in the diagnosis of influenza from Nasopharyngeal swab specimens and should not be used as a sole basis for treatment. Nasal washings and aspirates are unacceptable for Xpert Xpress SARS-CoV-2/FLU/RSV testing.  Fact Sheet for Patients: EntrepreneurPulse.com.au  Fact Sheet for Healthcare Providers: IncredibleEmployment.be  This test is not yet approved or cleared by the [place] FDA and has been authorized for detection and/or diagnosis of SARS-CoV-2 by FDA under an Emergency Use Authorization (EUA). This EUA will remain in effect (meaning this test can be used) for the duration of the COVID-19 declaration under Section 564(b)(1) of the Act, 21 U.S.C. section 360bbb-3(b)(1), unless the authorization is terminated or revoked.  Performed at [facility], [place] [address], [place], [place] [address]   Blood Culture (routine x 2)     Status: None (Preliminary result)   Collection Time: 10/29/21  7:27  PM   Specimen: BLOOD  Result Value Ref Range Status   Specimen Description   Final    BLOOD RIGHT ANTECUBITAL Performed at [place] [address], [place], [place] [address]    Special Requests   Final    BOTTLES DRAWN AEROBIC AND ANAEROBIC Blood Culture adequate volume Performed at [facility] [address], [place], [place] [address]    Culture   Final    NO GROWTH 1 DAY Performed at [facility], [place] [address], [place], [place] [address]    Report Status PENDING  Incomplete  MRSA Next Gen by PCR, Nasal     Status: None   Collection Time: 10/29/21 11:14 PM   Specimen: Nasal Mucosa; Nasal Swab  Result Value Ref Range Status   MRSA by PCR Next Gen NOT DETECTED NOT DETECTED Final    Comment: (NOTE) The GeneXpert MRSA Assay (FDA approved for NASAL specimens only), is one component of a comprehensive MRSA colonization surveillance program. It is not intended to diagnose MRSA infection nor to guide or monitor treatment for MRSA infections. Test performance is not FDA approved in patients less than 39 years old. Performed at [facility], [place] [address], [place], [place] [address]   Expectorated Sputum Assessment w Gram Stain, Rflx to Resp Cult     Status: None   Collection Time: 10/30/21 12:34 PM   Specimen: Expectorated Sputum  Result Value Ref Range Status   Specimen Description EXPECTORATED SPUTUM  Final   Special Requests NONE  Final   Sputum evaluation   Final    Sputum specimen not acceptable for testing.  Please recollect.   Performed at [facility], [place] [address], [place],  [address]    Report Status 10/30/2021 FINAL  Final         Radiology Studies: CT Angio Chest PE W and/or Wo Contrast  Result Date: 10/29/2021 CLINICAL DATA:  Concern for pulmonary embolism. EXAM: CT ANGIOGRAPHY CHEST WITH CONTRAST TECHNIQUE: Multidetector CT imaging of the chest was performed  using the standard protocol during bolus administration of intravenous contrast. Multiplanar CT image reconstructions and MIPs were obtained to evaluate the vascular anatomy. RADIATION DOSE REDUCTION: This exam was performed according to the departmental dose-optimization program which includes automated exposure control, adjustment of the mA and/or kV according to patient size and/or use of iterative reconstruction technique. CONTRAST:  62m OMNIPAQUE IOHEXOL 350 MG/ML SOLN COMPARISON:  Chest CT dated 10/06/2021 and radiograph dated 10/29/2021. FINDINGS: Evaluation of this exam is  limited due to respiratory motion artifact. Cardiovascular: There is no cardiomegaly or pericardial effusion. There is coronary vascular calcification, advanced for the patient's age. Mild atherosclerotic calcification of the thoracic aorta. No aneurysmal dilatation or dissection. Evaluation of the pulmonary arteries is limited due to respiratory motion artifact. Bilateral pulmonary artery emboli involving the lobar branches of the right lower lobe extending into the segmental branches as well as in the left upper lobar branch. The right ventricular lumen measures approximately 4.0 cm and the left ventricular lumen measures 2.8 cm with RV/LV ratio of 1.4 consistent with right heart straining. Correlation with clinical exam and echocardiogram recommended. Mediastinum/Nodes: Similar appearance of bilateral hilar and mediastinal soft tissue masses or adenopathy measuring approximately 2 x 4 cm in the prevascular space. Subcarinal lymph node measures 16 mm in short axis. The esophagus is grossly unremarkable. No mediastinal fluid collection. Lungs/Pleura: Background of moderate emphysema. Similar appearance of right upper lobe subpleural masslike density measuring 3.3 x 1.4 cm as well as a 9 mm nodular density in the left upper lobe, unchanged since the study of 10/06/2021. Left lung base linear atelectasis/scarring. No new consolidation.  There is no pleural effusion or pneumothorax. The central airways are patent. Upper Abdomen: No acute abnormality. Musculoskeletal: No chest wall abnormality. No acute or significant osseous findings. Review of the MIP images confirms the above findings. IMPRESSION: 1. Bilateral pulmonary artery emboli with CT evidence of right heart straining (RV/LV Ratio = 1.4) consistent with at least submassive (intermediate risk) PE. The presence of right heart strain has been associated with an increased risk of morbidity and mortality. 2. Similar appearance of bilateral hilar and mediastinal soft tissue masses or adenopathy as well as bilateral upper lobe pulmonary nodules. 3. Coronary vascular calcification, advanced for the patient's age. 4. Aortic Atherosclerosis (ICD10-I70.0) and Emphysema (ICD10-J43.9). These results were called by telephone at the time of interpretation on 10/29/2021 at 7:28 pm to provider Theodis Blaze , who verbally acknowledged these results. Electronically Signed   By: Anner Crete M.D.   On: 10/29/2021 19:28   DG Chest Port 1 View  Result Date: 10/29/2021 CLINICAL DATA:  Questionable sepsis. EXAM: PORTABLE CHEST 1 VIEW COMPARISON:  Chest x-ray 10/06/2021.  CT chest 10/06/2021. FINDINGS: The heart size and mediastinal contours are within normal limits. Focal opacity in the right upper lobe appears unchanged. Lungs are otherwise clear. The visualized skeletal structures are unremarkable. IMPRESSION: Stable exam.  No acute cardiopulmonary process. Electronically Signed   By: Ronney Asters M.D.   On: 10/29/2021 18:37   ECHOCARDIOGRAM COMPLETE  Result Date: 10/30/2021    ECHOCARDIOGRAM REPORT   Patient Name:   SHERRY ROGUS [facility] Date of Exam: 10/30/2021 Medical Rec #:  [id number]         Height:       66.0 in Accession #:    [id number]        Weight:       151.9 lb Date of Birth:  [birth year]        BSA:          1.779 m Patient Age:    79 years          BP:           114/90 mmHg Patient Gender:  M                 HR:           97 bpm. Exam Location:  Inpatient Procedure: 2D Echo, Cardiac Doppler and  Color Doppler Indications:    Pulmonary embolism  History:        Patient has no prior history of Echocardiogram examinations.                 Signs/Symptoms:Dyspnea.  Sonographer:    Joette Catching RCS Referring Phys: [id number] [place]  1. Left ventricular ejection fraction, by estimation, is 55 to 60%. Left ventricular ejection fraction by 3D volume is 54 %. The left ventricle has normal function. The left ventricle has no regional wall motion abnormalities. Left ventricular diastolic  parameters are consistent with Grade I diastolic dysfunction (impaired relaxation).  2. Right ventricular systolic function is normal. The right ventricular size is normal.  3. The mitral valve is normal in structure. Trivial mitral valve regurgitation. No evidence of mitral stenosis.  4. The aortic valve is normal in structure. Aortic valve regurgitation is not visualized. No aortic stenosis is present.  5. The inferior vena cava is normal in size with greater than 50% respiratory variability, suggesting right atrial pressure of 3 mmHg. FINDINGS  Left Ventricle: Left ventricular ejection fraction, by estimation, is 55 to 60%. Left ventricular ejection fraction by 3D volume is 54 %. The left ventricle has normal function. The left ventricle has no regional wall motion abnormalities. The left ventricular internal cavity size was normal in size. There is no left ventricular hypertrophy. Left ventricular diastolic parameters are consistent with Grade I diastolic dysfunction (impaired relaxation). Normal left ventricular filling pressure. Right Ventricle: The right ventricular size is normal. No increase in right ventricular wall thickness. Right ventricular systolic function is normal. Left Atrium: Left atrial size was normal in size. Right Atrium: Right atrial size was normal in size. Pericardium: There is no  evidence of pericardial effusion. Mitral Valve: The mitral valve is normal in structure. Trivial mitral valve regurgitation. No evidence of mitral valve stenosis. Tricuspid Valve: The tricuspid valve is normal in structure. Tricuspid valve regurgitation is trivial. No evidence of tricuspid stenosis. Aortic Valve: The aortic valve is normal in structure. Aortic valve regurgitation is not visualized. No aortic stenosis is present. Aortic valve mean gradient measures 4.0 mmHg. Aortic valve peak gradient measures 7.2 mmHg. Aortic valve area, by VTI measures 2.33 cm. Pulmonic Valve: The pulmonic valve was normal in structure. Pulmonic valve regurgitation is trivial. No evidence of pulmonic stenosis. Aorta: The aortic root is normal in size and structure. Venous: The inferior vena cava is normal in size with greater than 50% respiratory variability, suggesting right atrial pressure of 3 mmHg. IAS/Shunts: No atrial level shunt detected by color flow Doppler.  LEFT VENTRICLE PLAX 2D LVIDd:         3.75 cm         Diastology LVIDs:         2.60 cm         LV e' medial:    8.49 cm/s LV PW:         0.90 cm         LV E/e' medial:  7.4 LV IVS:        1.00 cm         LV e' lateral:   10.20 cm/s LVOT diam:     2.10 cm         LV E/e' lateral: 6.1 LV SV:         52 LV SV Index:   29 LVOT Area:     3.46 cm  3D Volume EF                                LV 3D EF:    Left                                             ventricul LV Volumes (MOD)                            ar LV vol d, MOD    37.9 ml                    ejection A2C:                                        fraction LV vol d, MOD    45.9 ml                    by 3D A4C:                                        volume is LV vol s, MOD    18.8 ml                    54 %. A2C: LV vol s, MOD    22.1 ml A4C:                           3D Volume EF: LV SV MOD A2C:   19.1 ml       3D EF:        54 % LV SV MOD A4C:   45.9 ml       LV EDV:       88 ml LV SV MOD BP:    21.4 ml        LV ESV:       41 ml                                LV SV:        47 ml RIGHT VENTRICLE             IVC RV Basal diam:  2.10 cm     IVC diam: 1.70 cm RV Mid diam:    1.70 cm RV S prime:     13.10 cm/s TAPSE (M-mode): 2.0 cm LEFT ATRIUM             Index        RIGHT ATRIUM           Index LA diam:        3.30 cm 1.85 cm/m   RA Area:     13.30 cm LA Vol (A2C):   32.8 ml 18.44 ml/m  RA Volume:   29.20 ml  16.41 ml/m LA Vol (A4C):   31.3 ml 17.59 ml/m LA Biplane Vol: 35.3 ml 19.84 ml/m  AORTIC VALVE  PULMONIC VALVE AV Area (Vmax):    2.41 cm     PV Vmax:          0.95 m/s AV Area (Vmean):   2.67 cm     PV Peak grad:     3.6 mmHg AV Area (VTI):     2.33 cm     PR End Diast Vel: 5.66 msec AV Vmax:           134.00 cm/s AV Vmean:          96.600 cm/s AV VTI:            0.223 m AV Peak Grad:      7.2 mmHg AV Mean Grad:      4.0 mmHg LVOT Vmax:         93.30 cm/s LVOT Vmean:        74.400 cm/s LVOT VTI:          0.150 m LVOT/AV VTI ratio: 0.67  AORTA Ao Root diam: 2.70 cm Ao Asc diam:  3.50 cm MITRAL VALVE               TRICUSPID VALVE MV Area (PHT): 6.17 cm    TR Peak grad:   13.8 mmHg MV Decel Time: 123 msec    TR Vmax:        186.00 cm/s MV E velocity: 62.60 cm/s MV A velocity: 74.10 cm/s  SHUNTS MV E/A ratio:  0.84        Systemic VTI:  0.15 m                            Systemic Diam: 2.10 cm Dani Gobble Croitoru MD Electronically signed by Sanda Klein MD Signature Date/Time: 10/30/2021/11:44:47 AM    Final    VAS [place] LOWER EXTREMITY VENOUS (DVT)  Result Date: 10/30/2021  Lower Venous DVT Study Patient Name:  TRAVER MECKES [facility]  Date of Exam:   10/30/2021 Medical Rec #: [id number]          Accession #:    [id number] Date of Birth: Nov 17, 1975         Patient Gender: M Patient Age:   59 years Exam Location:  [facility] Procedure:      VAS [place] LOWER EXTREMITY VENOUS (DVT) Referring Phys: CHING TU --------------------------------------------------------------------------------  Indications:  Pulmonary embolism.  Risk Factors: Confirmed PE. Anticoagulation: Heparin. Limitations: Poor ultrasound/tissue interface and patient positioning, patient immobility. Comparison Study: No prior studies. Performing Technologist: Oliver Hum RVT  Examination Guidelines: A complete evaluation includes B-mode imaging, spectral Doppler, color Doppler, and power Doppler as needed of all accessible portions of each vessel. Bilateral testing is considered an integral part of a complete examination. Limited examinations for reoccurring indications may be performed as noted. The reflux portion of the exam is performed with the patient in reverse Trendelenburg.  +---------+---------------+---------+-----------+----------+--------------+ RIGHT    CompressibilityPhasicitySpontaneityPropertiesThrombus Aging +---------+---------------+---------+-----------+----------+--------------+ CFV      Full           Yes      Yes                                 +---------+---------------+---------+-----------+----------+--------------+ SFJ      Full                                                        +---------+---------------+---------+-----------+----------+--------------+  FV Prox  Full                                                        +---------+---------------+---------+-----------+----------+--------------+ FV Mid   Full                                                        +---------+---------------+---------+-----------+----------+--------------+ FV DistalFull                                                        +---------+---------------+---------+-----------+----------+--------------+ PFV      Full                                                        +---------+---------------+---------+-----------+----------+--------------+ POP      Partial        No       No                   Acute           +---------+---------------+---------+-----------+----------+--------------+ PTV      Full                                                        +---------+---------------+---------+-----------+----------+--------------+ PERO     Partial                                      Acute          +---------+---------------+---------+-----------+----------+--------------+ Gastroc  Partial                                      Acute          +---------+---------------+---------+-----------+----------+--------------+   +---------+---------------+---------+-----------+----------+--------------+ LEFT     CompressibilityPhasicitySpontaneityPropertiesThrombus Aging +---------+---------------+---------+-----------+----------+--------------+ CFV      Full           Yes      Yes                                 +---------+---------------+---------+-----------+----------+--------------+ SFJ      Full                                                        +---------+---------------+---------+-----------+----------+--------------+ FV Prox  Full                                                        +---------+---------------+---------+-----------+----------+--------------+  FV Mid   Full                                                        +---------+---------------+---------+-----------+----------+--------------+ FV DistalFull                                                        +---------+---------------+---------+-----------+----------+--------------+ PFV      Full                                                        +---------+---------------+---------+-----------+----------+--------------+ POP      Full           Yes      Yes                                 +---------+---------------+---------+-----------+----------+--------------+ PTV      Full                                                         +---------+---------------+---------+-----------+----------+--------------+ PERO     Full                                                        +---------+---------------+---------+-----------+----------+--------------+     Summary: RIGHT: - Findings consistent with acute deep vein thrombosis involving the right popliteal vein, right peroneal veins, and right gastrocnemius veins. - No cystic structure found in the popliteal fossa.  LEFT: - There is no evidence of deep vein thrombosis in the lower extremity. However, portions of this examination were limited- see technologist comments above.  - No cystic structure found in the popliteal fossa.  *See table(s) above for measurements and observations.    Preliminary         Scheduled Meds:  aspirin  81 mg Oral Daily   azithromycin  500 mg Oral QHS   bictegravir-emtricitabine-tenofovir AF  1 tablet Oral q morning   Chlorhexidine Gluconate Cloth  6 each Topical Daily   ethambutol  800 mg Oral Daily   fluconazole  200 mg Oral Daily   loratadine  10 mg Oral q morning   megestrol  400 mg Oral Daily   metoprolol succinate  50 mg Oral q morning   mirtazapine  15 mg Oral QHS   mometasone-formoterol  2 puff Inhalation BID   pantoprazole  40 mg Oral Daily   QUEtiapine  200 mg Oral QHS   rosuvastatin  5 mg Oral Daily   senna-docusate  1 tablet Oral BID   sulfamethoxazole-trimethoprim  1 tablet Oral Daily   umeclidinium bromide  1 puff Inhalation  Daily   Continuous Infusions:  heparin 1,650 Units/hr (10/31/21 0508)   lactated ringers 75 mL/hr at 10/31/21 0128     LOS: 1 day   Time spent= 35 mins    Aalivia Mcgraw Arsenio Loader, MD [facility]  If 7PM-7AM, please contact night-coverage  10/31/2021, 7:09 AM

## 2021-10-31 NOTE — Progress Notes (Signed)
  Transition of Care [facility]) Screening Note   Patient Details  Name: Kurt Foley Date of Birth: 05-Jan-1976   Transition of Care [facility]) CM/SW Contact:    Lanier Clam, RN Phone Number: 10/31/2021, 9:46 AM    Transition of Care Department [facility]) has reviewed patient and no TOC needs have been identified at this time. We will continue to monitor patient advancement through interdisciplinary progression rounds. If new patient transition needs arise, please place a TOC consult.

## 2021-10-31 NOTE — Telephone Encounter (Signed)
Patient is scheduled with BI for follow up of RUL mass and discussing bronchoscopy on 11/21/2021 at 10:30am- appointment reminder mailed to address on file. Nothing further needed.

## 2021-10-31 NOTE — Progress Notes (Signed)
ANTICOAGULATION CONSULT NOTE - Follow Up Consult  Pharmacy Consult for Heparin Indication: pulmonary embolus and DVT  Allergies  Allergen Reactions   Lidocaine Other (See Comments)    Large bruise at site of patch placement    Patient Measurements: Height: 5\' 6"  (167.6 cm) Weight: 68.9 kg (151 lb 14.4 oz) IBW/kg (Calculated) : 63.8 Heparin Dosing Weight: TBW  Vital Signs: Temp: 99 F (37.2 C) (05/22 0000) Temp Source: Axillary (05/22 0000) BP: 113/92 (05/22 0200) Pulse Rate: 116 (05/22 0200)  Labs: Recent Labs    10/29/21 1744 10/29/21 1744 10/29/21 2023 10/30/21 0234 10/30/21 0936 10/30/21 1820 10/31/21 0240  HGB 11.4*  --   --  9.9*  --   --  10.0*  HCT 35.5*  --   --  31.4*  --   --  30.5*  PLT 359  --   --  310  --   --  286  APTT 37*  --   --   --   --   --   --   LABPROT 13.9  --   --   --   --   --   --   INR 1.1  --   --   --   --   --   --   HEPARINUNFRC  --    < >  --  0.30 0.28* 0.33 0.39  CREATININE 1.08  --   --  0.88  --   --   --   TROPONINIHS 8  --  7  --   --   --   --    < > = values in this interval not displayed.     Estimated Creatinine Clearance: 93.6 mL/min (by C-G formula based on SCr of 0.88 mg/dL).   Medications:  Infusions:   heparin 1,650 Units/hr (10/30/21 1933)   lactated ringers 75 mL/hr at 10/31/21 0128    Assessment: 32 yoM admitted on 5/20 with submassive PE and RLE DVT.  Pharmacy is consulted to dose heparin.  PMH significant for HIV and CVA (~7 months ago).  No noted prior to admission anticoagulation.  Today, 10/31/2021: Heparin level 0.39 on 1650 units/hr CBC: Hgb decreased 11.4 > 9.9>10  baseline of 9-10.  Plt WNL.  No bleeding or complications reported.    Goal of Therapy:  Heparin level 0.3-0.7 units/ml Monitor platelets by anticoagulation protocol: Yes   Plan:  continue heparin IV infusion at 1500 units/hr Heparin level in 6 hours  Daily heparin level and CBC Follow up long-term anticoagulation  plans. Dolly Rias RPh 10/31/2021, 3:23 AM

## 2021-10-31 NOTE — Progress Notes (Signed)
ANTICOAGULATION CONSULT NOTE - Follow Up Consult  Pharmacy Consult for Heparin Indication: pulmonary embolus and DVT  Allergies  Allergen Reactions   Lidocaine Other (See Comments)    Large bruise at site of patch placement    Patient Measurements: Height: 5\' 6"  (167.6 cm) Weight: 68.9 kg (151 lb 14.4 oz) IBW/kg (Calculated) : 63.8 Heparin Dosing Weight: TBW  Vital Signs: Temp: 99.1 F (37.3 C) (05/22 0632) Temp Source: Oral (05/22 10-16-1997) BP: 115/78 (05/22 0800) Pulse Rate: 123 (05/22 0800)  Labs: Recent Labs    10/29/21 1744 10/29/21 2023 10/30/21 0234 10/30/21 0936 10/30/21 1820 10/31/21 0240 10/31/21 0750  HGB 11.4*  --  9.9*  --   --  10.0*  --   HCT 35.5*  --  31.4*  --   --  30.5*  --   PLT 359  --  310  --   --  286  --   APTT 37*  --   --   --   --   --   --   LABPROT 13.9  --   --   --   --   --   --   INR 1.1  --   --   --   --   --   --   HEPARINUNFRC  --   --  0.30   < > 0.33 0.39 0.48  CREATININE 1.08  --  0.88  --   --  0.98  --   TROPONINIHS 8 7  --   --   --   --   --    < > = values in this interval not displayed.     Estimated Creatinine Clearance: 84.1 mL/min (by C-G formula based on SCr of 0.98 mg/dL).   Medications:  Infusions:   heparin 1,650 Units/hr (10/31/21 0508)   lactated ringers 75 mL/hr at 10/31/21 0128   magnesium sulfate bolus IVPB 2 g (10/31/21 0750)    Assessment: 45 yoM admitted on 5/20 with submassive PE and RLE DVT.  Pharmacy is consulted to dose heparin.  PMH significant for HIV and CVA (~7 months ago).  No noted prior to admission anticoagulation.  Today, 10/31/2021: Heparin level 0.48 on 1650 units/hr CBC: Hgb low but near baseline of 9-10.  Plt WNL.  No bleeding or complications reported.  SCr 0.98, stable  To transition to Xarelto this evening per CCM.   Goal of Therapy:  Heparin level 0.3-0.7 units/ml Monitor platelets by anticoagulation protocol: Yes   Plan:  continue heparin IV infusion at 1650  units/hr until 17:00, then transition to Xarelto 15mg  BID x 21 days, then 20mg  daily thereafter Will provide 30-day free coupon voucher prior to discharge Stop aspirin  11/02/2021, PharmD, BCPS Pharmacy: [phone] 10/31/2021, 8:19 AM

## 2021-10-31 NOTE — Progress Notes (Addendum)
NAME:  Kurt Foley, MRN:  [id number], DOB:  1975-11-17, LOS: 1 ADMISSION DATE:  10/29/2021, CONSULTATION DATE:  10/29/2021 REFERRING MD: Theodis Blaze, PA, CHIEF COMPLAINT: Pulmonary embolism  History of Present Illness:  Presented to the hospital with shortness of breath shortness of breath for few weeks , Worsened over the last 1 week Did have a mild fever at home  Did have a cough  Worsening symptoms led to presentation to the hospital  CT scan of the chest showing submassive pulmonary embolism  Code sepsis initiated  Pertinent  Medical History   Past Medical History:  Diagnosis Date   Depression    HIV infection ([place])   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  Soft tissue masses stable from previous.  Echo: LVEF 41-32%, grade 1 diastolic dysfunction, RV systolic size and function nl, RA normal size LE dopplers: acute deep vein thrombosis involving the right  popliteal vein, right peroneal veins, and right gastrocnemius veins  Interim History / Subjective:   Remains tachycardic 120's No complaints other than back pain from lying in bed Afebrile On heparin  Objective   Blood pressure 106/71, pulse (!) 124, temperature 99.1 F (37.3 C), temperature source Oral, resp. rate (!) 36, height _0  (1.676 m), weight 68.9 kg, SpO2 93 %.        Intake/Output Summary (Last 24 hours) at 10/31/2021 4401 Last data filed at 10/31/2021 0600 Gross per 24 hour  Intake 2443.54 ml  Output 2500 ml  Net -56.46 ml    Filed Weights   10/29/21 1734  10/30/21 0000  Weight: 68 kg 68.9 kg    General:  well-nourished M, resting in bed in no acute distress HEENT: MM pink/moist, pupils equal and sclera anicteric Neuro: arousable to voice, following commands, speech clear and conversation appropriate CV: s1s2 tachycardic, no m/r/g PULM:  clear bilaterally on 2L [place] without distress, no wheezing or rhonchi GI: soft, non-distended and non-tender Extremities: warm/dry, no edema  Skin: no rashes or lesions   Labs reviewed: Mag 1.6 WBC 5.5 Glu 139  Resolved Hospital Problem list     Assessment & Plan:    Submassive pulmonary embolism Acute RLE DVT Pesi score of 87 on admission. Echo without RV strain. Elevated BNP but normal troponin. Lactic acid not elevated -tachycardic, but currently normotensive and asymptomatic  - Continue heparin drip - Can transition to Xarelto this evening  -PCCM will plan to sign off, please re-engage for any concerns  Disseminated MAC infection HIV infection Lung Nodules -Follows with infectious disease -On albuterol, Bactrim, Biktarvy and ethambutol -bilateral upper lobe lung nodules -Ongoing plans for possible navigational bronchoscopy for biopsies, will try to reschedule bx with Dr. Valeta Harms  Sepsis Presenting differential, less likely based on culture data and PE dx No current clear source  -sputum cx negative, BC 1/2 staph epi likely contamination, UC no growth, strep pneumo neg, no leukocytosis -procal 0.37, lactic 1.3 -primary team d/c Vanc/cefepime, complete course of Azith, Bactrim started  History of stroke about 7 months ago with residual right-sided weakness -stable  History of cocaine dependence -Counseling  Emphysema RUL Lung Mass -  Continue bronchodilator treatments - We will need to re-schedule his appointment with Dr. Valeta Harms on 5/22 to a later date to discuss bronchoscopy for mass biopsy  PCCM will continue to follow  Best Practice (right click and "Reselect all SmartList  Selections" daily)   Diet/type: Regular consistency (see orders) DVT prophylaxis: systemic heparin GI prophylaxis: N/A Lines: N/A Foley:  N/A Code Status:  full code Last date of multidisciplinary goals of care discussion [pending, improving]  Labs   CBC: Recent Labs  Lab 10/29/21 1744 10/30/21 0234 10/31/21 0240  WBC 7.2 6.5 5.5  NEUTROABS 4.8  --   --   HGB 11.4* 9.9* 10.0*  HCT 35.5* 31.4* 30.5*  MCV 88.5 88.7 86.6  PLT 359 310 286     Basic Metabolic Panel: Recent Labs  Lab 10/29/21 1744 10/30/21 0234 10/31/21 0240  NA 135 139 135  K 4.5 4.2 4.0  CL 107 111 107  CO2 17* 19* 18*  GLUCOSE 98 117* 139*  BUN 21* 17 13  CREATININE 1.08 0.88 0.98  CALCIUM 10.1 10.0 9.7  MG  --   --  1.6*    GFR: Estimated Creatinine Clearance: 84.1 mL/min (by C-G formula based on SCr of 0.98 mg/dL). Recent Labs  Lab 10/29/21 1744 10/29/21 2023 10/30/21 0234 10/31/21 0240  PROCALCITON  --  0.37  --   --   WBC 7.2  --  6.5 5.5  LATICACIDVEN 1.3 1.3  --   --      Liver Function Tests: Recent Labs  Lab 10/29/21 1744 10/30/21 0234  AST 97* 73*  ALT 116* 101*  ALKPHOS 310* 279*  BILITOT 0.6 0.5  PROT 8.4* 7.5  ALBUMIN 3.4* 3.1*    No results for input(s): LIPASE, AMYLASE in the last 168 hours. No results for input(s): AMMONIA in the last 168 hours.  ABG    Component Value Date/Time   PHART 7.46 (H) 10/29/2021 1756   PCO2ART 22 (L) 10/29/2021 1756   PO2ART 78 (L) 10/29/2021 1756   HCO3 15.6 (L) 10/29/2021 1756   ACIDBASEDEF 6.1 (H) 10/29/2021 1756   O2SAT 97.1 10/29/2021 1756      Coagulation Profile: Recent Labs  Lab 10/29/21 1744  INR 1.1     Cardiac Enzymes: No results for input(s): CKTOTAL, CKMB, CKMBINDEX, TROPONINI in the last 168 hours.  HbA1C: No results found for: HGBA1C  CBG: No results for input(s): GLUCAP in the last 168 hours.   Otilio Carpen Haily Caley, PA-C [facility] See Amion for pager If no response to  pager , please call [phone] until 7pm After 7:00 pm call Elink  [phone]

## 2021-11-01 ENCOUNTER — Encounter: Payer: Self-pay | Admitting: Physical Therapy

## 2021-11-01 DIAGNOSIS — R0600 Dyspnea, unspecified: Secondary | ICD-10-CM | POA: Diagnosis not present

## 2021-11-01 DIAGNOSIS — R7989 Other specified abnormal findings of blood chemistry: Secondary | ICD-10-CM | POA: Diagnosis not present

## 2021-11-01 DIAGNOSIS — I2699 Other pulmonary embolism without acute cor pulmonale: Secondary | ICD-10-CM | POA: Diagnosis not present

## 2021-11-01 DIAGNOSIS — N179 Acute kidney failure, unspecified: Secondary | ICD-10-CM | POA: Diagnosis not present

## 2021-11-01 LAB — HEPATIC FUNCTION PANEL
ALT: 67 U/L — ABNORMAL HIGH (ref 0–44)
AST: 39 U/L (ref 15–41)
Albumin: 3.1 g/dL — ABNORMAL LOW (ref 3.5–5.0)
Alkaline Phosphatase: 232 U/L — ABNORMAL HIGH (ref 38–126)
Bilirubin, Direct: <0.1 mg/dL (ref 0.0–0.2)
Total Bilirubin: 0.3 mg/dL (ref 0.3–1.2)
Total Protein: 7.7 g/dL (ref 6.5–8.1)

## 2021-11-01 LAB — CBC
HCT: 30.9 % — ABNORMAL LOW (ref 39.0–52.0)
Hemoglobin: 9.8 g/dL — ABNORMAL LOW (ref 13.0–17.0)
MCH: 28 pg (ref 26.0–34.0)
MCHC: 31.7 g/dL (ref 30.0–36.0)
MCV: 88.3 fL (ref 80.0–100.0)
Platelets: 310 10*3/uL (ref 150–400)
RBC: 3.5 MIL/uL — ABNORMAL LOW (ref 4.22–5.81)
RDW: 16.7 % — ABNORMAL HIGH (ref 11.5–15.5)
WBC: 4.9 10*3/uL (ref 4.0–10.5)
nRBC: 0 % (ref 0.0–0.2)

## 2021-11-01 LAB — BASIC METABOLIC PANEL
Anion gap: 8 (ref 5–15)
BUN: 17 mg/dL (ref 6–20)
CO2: 21 mmol/L — ABNORMAL LOW (ref 22–32)
Calcium: 9.4 mg/dL (ref 8.9–10.3)
Chloride: 110 mmol/L (ref 98–111)
Creatinine, Ser: 1.04 mg/dL (ref 0.61–1.24)
GFR, Estimated: >60 mL/min (ref >60)
Glucose, Bld: 112 mg/dL — ABNORMAL HIGH (ref 70–99)
Potassium: 4.3 mmol/L (ref 3.5–5.1)
Sodium: 139 mmol/L (ref 135–145)

## 2021-11-01 LAB — MAGNESIUM: Magnesium: 2.1 mg/dL (ref 1.7–2.4)

## 2021-11-01 MED ORDER — OXYCODONE HCL 5 MG PO TABS
5.0000 mg | ORAL_TABLET | ORAL | Status: DC | PRN
  Administered 2021-11-01 (×2): 5 mg via ORAL
  Filled 2021-11-01 (×2): qty 1

## 2021-11-01 NOTE — Progress Notes (Signed)
PROGRESS NOTE    Kurt Foley  VZD:[id number] DOB: 13-Jun-1975 DOA: 10/29/2021 PCP: Gildardo Pounds, NP   Brief Narrative:  46 year old with history of HIV/AIDS, PML, disseminated MAC, prior CVA with residual right-sided weakness admitted for shortness of breath.  He had CTA done last month for shortness of breath as well and was negative for PE but showed progression of masslike opacity in the right upper lobe and was also treated for community-acquired pneumonia with plans of navigational bronchoscopy as outpatient.  During this admission CTA revealed bilateral pulmonary embolism with cor pulmonale, pulmonary team was consulted and he was started on anticoagulation.  Eventually switched over to oral Xarelto.   Assessment & Plan:  Principal Problem:   Pulmonary embolism (HCC) Active Problems:   Mass of upper lobe of right lung   Human immunodeficiency virus (HIV) disease (HCC)   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HCC)   Abnormal LFTs   History of CVA with residual right hemiparesis   Dyspnea   AKI (acute kidney injury) ([place])     Assessment and Plan: * Pulmonary embolism ([place]) without cor pulmonale Right lower extremity DVT Presented with dyspnea with CTA chest revealing bilateral pulmonary emboli with evidence of right heart strain. But echocardiogram did not show evidence of RV dysfunction.  Heparin drip transition to oral Xarelto. CCM following.  Echocardiogram-Echo shows 55%, grade 1 DD, normal RV function Lower extremity Dopplers-right lower extremity DVT Procalcitonin unequivocal-we will stop antibiotics.  Low-grade temp secondary to pulm embolic disease.  Mass of upper lobe of right lung - Seen by pulmonary, outpatient bronchoscopy will need to be rescheduled.  Abnormal LFTs Continue to monitor for now  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[place]) On ethambutol  Human immunodeficiency virus (HIV) disease ([place]) Continue home  Biktarvy, bactrim ethambutol.  Follows outpatient infectious disease -Last CD4 count on 5/10 of 46  History of CVA with residual right hemiparesis Has ambulatory PT referral from recent admission.  Dyspnea Secondary to pulmonary embolism.  Unequivocal procalcitonin, stop IV antibiotics.  Follow culture data.  AKI (acute kidney injury) ([place]), resolved Baseline 0.8, admission 1.08.  This is not resolved.     DVT prophylaxis: Heparin drip Code Status: Full code Family Communication:    Patient still remains dyspneic and tachycardic with ambulation and at rest.  Maintain hospital stay.  Subjective: Patient states that shortness of breath at rest has significantly improved but worried about this when he ambulates.  Examination:  Constitutional: Not in acute distress Respiratory: Clear to auscultation bilaterally Cardiovascular: Sinus tachycardia, no rubs Abdomen: Nontender nondistended good bowel sounds Musculoskeletal: No edema noted Skin: No rashes seen Neurologic: CN 2-12 grossly intact.  And nonfocal Psychiatric: Normal judgment and insight. Alert and oriented x 3. Normal mood.  Objective: Vitals:   10/31/21 2336 11/01/21 0000 11/01/21 0200 11/01/21 0400  BP:  112/80 (!) 96/59 122/90  Pulse:  (!) 109 (!) 112 (!) 112  Resp:  (!) 30 (!) 31 (!) 26  Temp: 100 F (37.8 C)   99 F (37.2 C)  TempSrc: Oral   Axillary  SpO2:  97% 96% 92%  Weight:      Height:        Intake/Output Summary (Last 24 hours) at 11/01/2021 0711 Last data filed at 11/01/2021 0442 Gross per 24 hour  Intake 1643.88 ml  Output 2450 ml  Net -806.12 ml   Filed Weights   10/29/21 1734 10/30/21 0000  Weight: 68 kg 68.9 kg     Data Reviewed:  CBC: Recent Labs  Lab 10/29/21 1744 10/30/21 0234 10/31/21 0240 11/01/21 0241  WBC 7.2 6.5 5.5 4.9  NEUTROABS 4.8  --   --   --   HGB 11.4* 9.9* 10.0* 9.8*  HCT 35.5* 31.4* 30.5* 30.9*  MCV 88.5 88.7 86.6 88.3  PLT 359 310 286 812   Basic  Metabolic Panel: Recent Labs  Lab 10/29/21 1744 10/30/21 0234 10/31/21 0240 11/01/21 0241  NA 135 139 135 139  K 4.5 4.2 4.0 4.3  CL 107 111 107 110  CO2 17* 19* 18* 21*  GLUCOSE 98 117* 139* 112*  BUN 21* _0 CREATININE 1.08 0.88 0.98 1.04  CALCIUM 10.1 10.0 9.7 9.4  MG  --   --  1.6* 2.1   GFR: Estimated Creatinine Clearance: 79.2 mL/min (by C-G formula based on SCr of 1.04 mg/dL). Liver Function Tests: Recent Labs  Lab 10/29/21 1744 10/30/21 0234 11/01/21 0241  AST 97* 73* 39  ALT 116* 101* 67*  ALKPHOS 310* 279* 232*  BILITOT 0.6 0.5 0.3  PROT 8.4* 7.5 7.7  ALBUMIN 3.4* 3.1* 3.1*   No results for input(s): LIPASE, AMYLASE in the last 168 hours. No results for input(s): AMMONIA in the last 168 hours. Coagulation Profile: Recent Labs  Lab 10/29/21 1744  INR 1.1   Cardiac Enzymes: No results for input(s): CKTOTAL, CKMB, CKMBINDEX, TROPONINI in the last 168 hours. BNP (last 3 results) No results for input(s): PROBNP in the last 8760 hours. HbA1C: No results for input(s): HGBA1C in the last 72 hours. CBG: No results for input(s): GLUCAP in the last 168 hours. Lipid Profile: No results for input(s): CHOL, HDL, LDLCALC, TRIG, CHOLHDL, LDLDIRECT in the last 72 hours. Thyroid Function Tests: No results for input(s): TSH, T4TOTAL, FREET4, T3FREE, THYROIDAB in the last 72 hours. Anemia Panel: No results for input(s): VITAMINB12, FOLATE, FERRITIN, TIBC, IRON, RETICCTPCT in the last 72 hours. Sepsis Labs: Recent Labs  Lab 10/29/21 1744 10/29/21 2023  PROCALCITON  --  0.37  LATICACIDVEN 1.3 1.3    Recent Results (from the past 240 hour(s))  Blood Culture (routine x 2)     Status: Abnormal   Collection Time: 10/29/21  5:44 PM   Specimen: BLOOD  Result Value Ref Range Status   Specimen Description   Final    BLOOD BLOOD LEFT HAND Performed at [facility], [place] [address] [place], [place] [address]    Special Requests   Final     BOTTLES DRAWN AEROBIC AND ANAEROBIC Blood Culture adequate volume Performed at [place] [address], [place], [place] [address]    Culture  Setup Time   Final    GRAM POSITIVE COCCI IN CLUSTERS AEROBIC BOTTLE ONLY CRITICAL RESULT CALLED TO, READ BACK BY AND VERIFIED WITH: PHARMD J.LEGGE AT 1952 ON 10/30/2021 BY T.SAAD.    Culture (A)  Final    STAPHYLOCOCCUS EPIDERMIDIS THE SIGNIFICANCE OF ISOLATING THIS ORGANISM FROM A SINGLE SET OF BLOOD CULTURES WHEN MULTIPLE SETS ARE DRAWN IS UNCERTAIN. PLEASE NOTIFY THE MICROBIOLOGY DEPARTMENT WITHIN ONE WEEK IF SPECIATION AND SENSITIVITIES ARE REQUIRED. Performed at [facility], [place] [address], [place], [place] [address]    Report Status 10/31/2021 FINAL  Final  Blood Culture ID Panel (Reflexed)     Status: Abnormal   Collection Time: 10/29/21  5:44 PM  Result Value Ref Range Status   Enterococcus faecalis NOT DETECTED NOT DETECTED Final   Enterococcus Faecium NOT DETECTED NOT DETECTED  Final   Listeria monocytogenes NOT DETECTED NOT DETECTED Final   Staphylococcus species DETECTED (A) NOT DETECTED Final    Comment: CRITICAL RESULT CALLED TO, READ BACK BY AND VERIFIED WITH: PHARMD J.LEGGE AT 1952 ON 10/30/2021 BY T.SAAD.    Staphylococcus aureus (BCID) NOT DETECTED NOT DETECTED Final   Staphylococcus epidermidis DETECTED (A) NOT DETECTED Final    Comment: Methicillin (oxacillin) resistant coagulase negative staphylococcus. Possible blood culture contaminant (unless isolated from more than one blood culture draw or clinical case suggests pathogenicity). No antibiotic treatment is indicated for blood  culture contaminants. CRITICAL RESULT CALLED TO, READ BACK BY AND VERIFIED WITH: PHARMD J.LEGGE AT 1952 ON 10/30/2021 BY T.SAAD.    Staphylococcus lugdunensis NOT DETECTED NOT DETECTED Final   Streptococcus species NOT DETECTED NOT DETECTED Final   Streptococcus agalactiae NOT DETECTED NOT DETECTED Final    Streptococcus pneumoniae NOT DETECTED NOT DETECTED Final   Streptococcus pyogenes NOT DETECTED NOT DETECTED Final   A.calcoaceticus-baumannii NOT DETECTED NOT DETECTED Final   Bacteroides fragilis NOT DETECTED NOT DETECTED Final   Enterobacterales NOT DETECTED NOT DETECTED Final   Enterobacter cloacae complex NOT DETECTED NOT DETECTED Final   Escherichia coli NOT DETECTED NOT DETECTED Final   Klebsiella aerogenes NOT DETECTED NOT DETECTED Final   Klebsiella oxytoca NOT DETECTED NOT DETECTED Final   Klebsiella pneumoniae NOT DETECTED NOT DETECTED Final   Proteus species NOT DETECTED NOT DETECTED Final   Salmonella species NOT DETECTED NOT DETECTED Final   Serratia marcescens NOT DETECTED NOT DETECTED Final   Haemophilus influenzae NOT DETECTED NOT DETECTED Final   Neisseria meningitidis NOT DETECTED NOT DETECTED Final   Pseudomonas aeruginosa NOT DETECTED NOT DETECTED Final   Stenotrophomonas maltophilia NOT DETECTED NOT DETECTED Final   Candida albicans NOT DETECTED NOT DETECTED Final   Candida auris NOT DETECTED NOT DETECTED Final   Candida glabrata NOT DETECTED NOT DETECTED Final   Candida krusei NOT DETECTED NOT DETECTED Final   Candida parapsilosis NOT DETECTED NOT DETECTED Final   Candida tropicalis NOT DETECTED NOT DETECTED Final   Cryptococcus neoformans/gattii NOT DETECTED NOT DETECTED Final   Methicillin resistance mecA/C DETECTED (A) NOT DETECTED Final    Comment: CRITICAL RESULT CALLED TO, READ BACK BY AND VERIFIED WITH: PHARMD J.LEGGE AT 1952 ON 10/30/2021 BY T.SAAD. Performed at [facility], [place] [address] [place], [place] [address]   Urine Culture     Status: None   Collection Time: 10/29/21  5:59 PM   Specimen: In/Out Cath Urine  Result Value Ref Range Status   Specimen Description   Final    IN/OUT CATH URINE Performed at [place] [address] [place], [place] [address]    Special Requests   Final    NONE Performed at [facility], [place] [address] [place], [place] [address]    Culture   Final    NO GROWTH Performed at [facility], [place] [address] [place], [place] [address]    Report Status 10/31/2021 FINAL  Final  Resp Panel by RT-PCR (Flu A&B, Covid)     Status: None   Collection Time: 10/29/21  6:24 PM  Result Value Ref Range Status   SARS Coronavirus 2 by RT PCR NEGATIVE NEGATIVE Final    Comment: (NOTE) SARS-CoV-2 target nucleic acids are NOT DETECTED.  The SARS-CoV-2 RNA is generally detectable in upper respiratory specimens during the acute phase of infection. The lowest concentration of SARS-CoV-2 viral copies this assay can  detect is 138 copies/mL. A negative result does not preclude SARS-Cov-2 infection and should not be used as the sole basis for treatment or other patient management decisions. A negative result may occur with  improper specimen collection/handling, submission of specimen other than nasopharyngeal swab, presence of viral mutation(s) within the areas targeted by this assay, and inadequate number of viral copies(<138 copies/mL). A negative result must be combined with clinical observations, patient history, and epidemiological information. The expected result is Negative.  Fact Sheet for Patients:  EntrepreneurPulse.com.au  Fact Sheet for Healthcare Providers:  IncredibleEmployment.be  This test is no t yet approved or cleared by the [place] FDA and  has been authorized for detection and/or diagnosis of SARS-CoV-2 by FDA under an Emergency Use Authorization (EUA). This EUA will remain  in effect (meaning this test can be used) for the duration of the COVID-19 declaration under Section 564(b)(1) of the Act, 21 U.S.C.section 360bbb-3(b)(1), unless the authorization is terminated  or revoked sooner.       Influenza A by PCR NEGATIVE NEGATIVE Final   Influenza B by PCR NEGATIVE NEGATIVE Final    Comment:  (NOTE) The Xpert Xpress SARS-CoV-2/FLU/RSV plus assay is intended as an aid in the diagnosis of influenza from Nasopharyngeal swab specimens and should not be used as a sole basis for treatment. Nasal washings and aspirates are unacceptable for Xpert Xpress SARS-CoV-2/FLU/RSV testing.  Fact Sheet for Patients: EntrepreneurPulse.com.au  Fact Sheet for Healthcare Providers: IncredibleEmployment.be  This test is not yet approved or cleared by the [place] FDA and has been authorized for detection and/or diagnosis of SARS-CoV-2 by FDA under an Emergency Use Authorization (EUA). This EUA will remain in effect (meaning this test can be used) for the duration of the COVID-19 declaration under Section 564(b)(1) of the Act, 21 U.S.C. section 360bbb-3(b)(1), unless the authorization is terminated or revoked.  Performed at [facility], [place] [address], [place], [place] [address]   Blood Culture (routine x 2)     Status: None (Preliminary result)   Collection Time: 10/29/21  7:27 PM   Specimen: BLOOD  Result Value Ref Range Status   Specimen Description   Final    BLOOD RIGHT ANTECUBITAL Performed at [place] [address] [place], [place] [address]    Special Requests   Final    BOTTLES DRAWN AEROBIC AND ANAEROBIC Blood Culture adequate volume Performed at [place] [address] [place], [place] [address]    Culture   Final    NO GROWTH 1 DAY Performed at [facility], [place] [address] [place], [place] [address]    Report Status PENDING  Incomplete  MRSA Next Gen by PCR, Nasal     Status: None   Collection Time: 10/29/21 11:14 PM   Specimen: Nasal Mucosa; Nasal Swab  Result Value Ref Range Status   MRSA by PCR Next Gen NOT DETECTED NOT DETECTED Final    Comment: (NOTE) The GeneXpert MRSA Assay (FDA approved for NASAL specimens only), is one component of a  comprehensive MRSA colonization surveillance program. It is not intended to diagnose MRSA infection nor to guide or monitor treatment for MRSA infections. Test performance is not FDA approved in patients less than 34 years old. Performed at [facility], [place] [address] [place], [place] [address]   Expectorated Sputum Assessment w Gram Stain, Rflx to Resp Cult     Status: None   Collection Time: 10/30/21 12:34 PM  Specimen: Expectorated Sputum  Result Value Ref Range Status   Specimen Description EXPECTORATED SPUTUM  Final   Special Requests NONE  Final   Sputum evaluation   Final    Sputum specimen not acceptable for testing.  Please recollect.   Performed at [facility], [place] [address] [place], [place] [address]    Report Status 10/30/2021 FINAL  Final         Radiology Studies: ECHOCARDIOGRAM COMPLETE  Result Date: 10/30/2021    ECHOCARDIOGRAM REPORT   Patient Name:   SHOUA RESSLER [facility] Date of Exam: 10/30/2021 Medical Rec #:  [id number]         Height:       66.0 in Accession #:    [id number]        Weight:       151.9 lb Date of Birth:  02-13-1976        BSA:          1.779 m Patient Age:    51 years          BP:           114/90 mmHg Patient Gender: M                 HR:           97 bpm. Exam Location:  Inpatient Procedure: 2D Echo, Cardiac Doppler and Color Doppler Indications:    Pulmonary embolism  History:        Patient has no prior history of Echocardiogram examinations.                 Signs/Symptoms:Dyspnea.  Sonographer:    Joette Catching RCS Referring Phys: [id number] [place]  1. Left ventricular ejection fraction, by estimation, is 55 to 60%. Left ventricular ejection fraction by 3D volume is 54 %. The left ventricle has normal function. The left ventricle has no regional wall motion abnormalities. Left ventricular diastolic  parameters are consistent with Grade I diastolic dysfunction (impaired relaxation).  2.  Right ventricular systolic function is normal. The right ventricular size is normal.  3. The mitral valve is normal in structure. Trivial mitral valve regurgitation. No evidence of mitral stenosis.  4. The aortic valve is normal in structure. Aortic valve regurgitation is not visualized. No aortic stenosis is present.  5. The inferior vena cava is normal in size with greater than 50% respiratory variability, suggesting right atrial pressure of 3 mmHg. FINDINGS  Left Ventricle: Left ventricular ejection fraction, by estimation, is 55 to 60%. Left ventricular ejection fraction by 3D volume is 54 %. The left ventricle has normal function. The left ventricle has no regional wall motion abnormalities. The left ventricular internal cavity size was normal in size. There is no left ventricular hypertrophy. Left ventricular diastolic parameters are consistent with Grade I diastolic dysfunction (impaired relaxation). Normal left ventricular filling pressure. Right Ventricle: The right ventricular size is normal. No increase in right ventricular wall thickness. Right ventricular systolic function is normal. Left Atrium: Left atrial size was normal in size. Right Atrium: Right atrial size was normal in size. Pericardium: There is no evidence of pericardial effusion. Mitral Valve: The mitral valve is normal in structure. Trivial mitral valve regurgitation. No evidence of mitral valve stenosis. Tricuspid Valve: The tricuspid valve is normal in structure. Tricuspid valve regurgitation is trivial. No evidence of tricuspid stenosis. Aortic Valve: The aortic valve is normal in structure. Aortic valve regurgitation is not visualized. No aortic stenosis  is present. Aortic valve mean gradient measures 4.0 mmHg. Aortic valve peak gradient measures 7.2 mmHg. Aortic valve area, by VTI measures 2.33 cm. Pulmonic Valve: The pulmonic valve was normal in structure. Pulmonic valve regurgitation is trivial. No evidence of pulmonic stenosis.  Aorta: The aortic root is normal in size and structure. Venous: The inferior vena cava is normal in size with greater than 50% respiratory variability, suggesting right atrial pressure of 3 mmHg. IAS/Shunts: No atrial level shunt detected by color flow Doppler.  LEFT VENTRICLE PLAX 2D LVIDd:         3.75 cm         Diastology LVIDs:         2.60 cm         LV e' medial:    8.49 cm/s LV PW:         0.90 cm         LV E/e' medial:  7.4 LV IVS:        1.00 cm         LV e' lateral:   10.20 cm/s LVOT diam:     2.10 cm         LV E/e' lateral: 6.1 LV SV:         52 LV SV Index:   29 LVOT Area:     3.46 cm        3D Volume EF                                LV 3D EF:    Left                                             ventricul LV Volumes (MOD)                            ar LV vol d, MOD    37.9 ml                    ejection A2C:                                        fraction LV vol d, MOD    45.9 ml                    by 3D A4C:                                        volume is LV vol s, MOD    18.8 ml                    54 %. A2C: LV vol s, MOD    22.1 ml A4C:                           3D Volume EF: LV SV MOD A2C:   19.1 ml       3D EF:        54 % LV SV MOD A4C:  45.9 ml       LV EDV:       88 ml LV SV MOD BP:    21.4 ml       LV ESV:       41 ml                                LV SV:        47 ml RIGHT VENTRICLE             IVC RV Basal diam:  2.10 cm     IVC diam: 1.70 cm RV Mid diam:    1.70 cm RV S prime:     13.10 cm/s TAPSE (M-mode): 2.0 cm LEFT ATRIUM             Index        RIGHT ATRIUM           Index LA diam:        3.30 cm 1.85 cm/m   RA Area:     13.30 cm LA Vol (A2C):   32.8 ml 18.44 ml/m  RA Volume:   29.20 ml  16.41 ml/m LA Vol (A4C):   31.3 ml 17.59 ml/m LA Biplane Vol: 35.3 ml 19.84 ml/m  AORTIC VALVE                    PULMONIC VALVE AV Area (Vmax):    2.41 cm     PV Vmax:          0.95 m/s AV Area (Vmean):   2.67 cm     PV Peak grad:     3.6 mmHg AV Area (VTI):     2.33 cm     PR End Diast  Vel: 5.66 msec AV Vmax:           134.00 cm/s AV Vmean:          96.600 cm/s AV VTI:            0.223 m AV Peak Grad:      7.2 mmHg AV Mean Grad:      4.0 mmHg LVOT Vmax:         93.30 cm/s LVOT Vmean:        74.400 cm/s LVOT VTI:          0.150 m LVOT/AV VTI ratio: 0.67  AORTA Ao Root diam: 2.70 cm Ao Asc diam:  3.50 cm MITRAL VALVE               TRICUSPID VALVE MV Area (PHT): 6.17 cm    TR Peak grad:   13.8 mmHg MV Decel Time: 123 msec    TR Vmax:        186.00 cm/s MV E velocity: 62.60 cm/s MV A velocity: 74.10 cm/s  SHUNTS MV E/A ratio:  0.84        Systemic VTI:  0.15 m                            Systemic Diam: 2.10 cm Sanda Klein MD Electronically signed by Sanda Klein MD Signature Date/Time: 10/30/2021/11:44:47 AM    Final    VAS [place] LOWER EXTREMITY VENOUS (DVT)  Result Date: 10/31/2021  Lower Venous DVT Study Patient Name:  JACEON HEIBERGER [facility]  Date of Exam:   10/30/2021 Medical Rec #: [id number]  Accession #:    [id number] Date of Birth: [birth year]         Patient Gender: M Patient Age:   28 years Exam Location[facility]  [facility] Procedure:      VAS [place] LOWER EXTREMITY VENOUS (DVT) Referring Phys: CHING TU --------------------------------------------------------------------------------  Indications: Pulmonary embolism.  Risk Factors: Confirmed PE. Anticoagulation: Heparin. Limitations: Poor ultrasound/tissue interface and patient positioning, patient immobility. Comparison Study: No prior studies. Performing Technologist: Oliver Hum RVT  Examination Guidelines: A complete evaluation includes B-mode imaging, spectral Doppler, color Doppler, and power Doppler as needed of all accessible portions of each vessel. Bilateral testing is considered an integral part of a complete examination. Limited examinations for reoccurring indications may be performed as noted. The reflux portion of the exam is performed with the patient in reverse Trendelenburg.   +---------+---------------+---------+-----------+----------+--------------+ RIGHT    CompressibilityPhasicitySpontaneityPropertiesThrombus Aging +---------+---------------+---------+-----------+----------+--------------+ CFV      Full           Yes      Yes                                 +---------+---------------+---------+-----------+----------+--------------+ SFJ      Full                                                        +---------+---------------+---------+-----------+----------+--------------+ FV Prox  Full                                                        +---------+---------------+---------+-----------+----------+--------------+ FV Mid   Full                                                        +---------+---------------+---------+-----------+----------+--------------+ FV DistalFull                                                        +---------+---------------+---------+-----------+----------+--------------+ PFV      Full                                                        +---------+---------------+---------+-----------+----------+--------------+ POP      Partial        No       No                   Acute          +---------+---------------+---------+-----------+----------+--------------+ PTV      Full                                                        +---------+---------------+---------+-----------+----------+--------------+  PERO     Partial                                      Acute          +---------+---------------+---------+-----------+----------+--------------+ Gastroc  Partial                                      Acute          +---------+---------------+---------+-----------+----------+--------------+   +---------+---------------+---------+-----------+----------+--------------+ LEFT     CompressibilityPhasicitySpontaneityPropertiesThrombus Aging  +---------+---------------+---------+-----------+----------+--------------+ CFV      Full           Yes      Yes                                 +---------+---------------+---------+-----------+----------+--------------+ SFJ      Full                                                        +---------+---------------+---------+-----------+----------+--------------+ FV Prox  Full                                                        +---------+---------------+---------+-----------+----------+--------------+ FV Mid   Full                                                        +---------+---------------+---------+-----------+----------+--------------+ FV DistalFull                                                        +---------+---------------+---------+-----------+----------+--------------+ PFV      Full                                                        +---------+---------------+---------+-----------+----------+--------------+ POP      Full           Yes      Yes                                 +---------+---------------+---------+-----------+----------+--------------+ PTV      Full                                                        +---------+---------------+---------+-----------+----------+--------------+ PERO  Full                                                        +---------+---------------+---------+-----------+----------+--------------+     Summary: RIGHT: - Findings consistent with acute deep vein thrombosis involving the right popliteal vein, right peroneal veins, and right gastrocnemius veins. - No cystic structure found in the popliteal fossa.  LEFT: - There is no evidence of deep vein thrombosis in the lower extremity. However, portions of this examination were limited- see technologist comments above.  - No cystic structure found in the popliteal fossa.  *See table(s) above for measurements and observations. Electronically  signed by Monica Martinez MD on 10/31/2021 at 5:08:55 PM.    Final         Scheduled Meds:  azithromycin  500 mg Oral QHS   bictegravir-emtricitabine-tenofovir AF  1 tablet Oral q morning   Chlorhexidine Gluconate Cloth  6 each Topical Daily   ethambutol  800 mg Oral Daily   fluconazole  200 mg Oral Daily   loratadine  10 mg Oral q morning   megestrol  400 mg Oral Daily   metoprolol succinate  50 mg Oral q morning   mirtazapine  15 mg Oral QHS   mometasone-formoterol  2 puff Inhalation BID   pantoprazole  40 mg Oral Daily   pneumococcal 23 valent vaccine  0.5 mL Intramuscular Tomorrow-1000   QUEtiapine  200 mg Oral QHS   Rivaroxaban  15 mg Oral BID WC   Followed by   Derrill Memo ON 11/21/2021] rivaroxaban  20 mg Oral Q supper   rosuvastatin  5 mg Oral Daily   senna-docusate  1 tablet Oral BID   sulfamethoxazole-trimethoprim  1 tablet Oral Daily   umeclidinium bromide  1 puff Inhalation Daily   Continuous Infusions:  lactated ringers 100 mL/hr at 11/01/21 0400     LOS: 2 days   Time spent= 35 mins    Anmarie Fukushima Arsenio Loader, MD [facility]  If 7PM-7AM, please contact night-coverage  11/01/2021, 7:11 AM

## 2021-11-01 NOTE — Discharge Instructions (Addendum)
Information on my medicine - XARELTO (rivaroxaban)  This medication education was reviewed with me or my healthcare representative as part of my discharge preparation.  The pharmacist that spoke with me during my hospital stay was:  Nelma Rothman, Student-PharmD  WHY WAS Kurt Foley PRESCRIBED FOR YOU? Xarelto was prescribed to treat blood clots that may have been found in the veins of your legs (deep vein thrombosis) or in your lungs (pulmonary embolism) and to reduce the risk of them occurring again.  What do you need to know about Xarelto? The starting dose is one 15 mg tablet taken TWICE daily with food for the FIRST 21 DAYS then on  June 12th, 2023  the dose is changed to one 20 mg tablet taken ONCE A DAY with your evening meal.  DO NOT stop taking Xarelto without talking to the health care provider who prescribed the medication.  Refill your prescription for 20 mg tablets before you run out.  After discharge, you should have regular check-up appointments with your healthcare provider that is prescribing your Xarelto.  In the future your dose may need to be changed if your kidney function changes by a significant amount.  What do you do if you miss a dose? If you are taking Xarelto TWICE DAILY and you miss a dose, take it as soon as you remember. You may take two 15 mg tablets (total 30 mg) at the same time then resume your regularly scheduled 15 mg twice daily the next day.  If you are taking Xarelto ONCE DAILY and you miss a dose, take it as soon as you remember on the same day then continue your regularly scheduled once daily regimen the next day. Do not take two doses of Xarelto at the same time.   Important Safety Information Xarelto is a blood thinner medicine that can cause bleeding. You should call your healthcare provider right away if you experience any of the following: Bleeding from an injury or your nose that does not stop. Unusual colored urine (red or dark brown) or  unusual colored stools (red or black). Unusual bruising for unknown reasons. A serious fall or if you hit your head (even if there is no bleeding).  Some medicines may interact with Xarelto and might increase your risk of bleeding while on Xarelto. To help avoid this, consult your healthcare provider or pharmacist prior to using any new prescription or non-prescription medications, including herbals, vitamins, non-steroidal anti-inflammatory drugs (NSAIDs) and supplements.  This website has more information on Xarelto: [email or web address].br.

## 2021-11-01 NOTE — Evaluation (Signed)
Physical Therapy Evaluation Patient Details Name: Kurt Foley MRN: [id number] DOB: [birth year] Today's Date: 11/01/2021  History of Present Illness  46 yo male admitted with PE, R LE DVT. 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  Clinical Impression  On eval, pt was Supv level for mobility. He walked ~150 feet with a hemiwalker. O2 93% on RA, dyspnea 2/4, HR 113 bpm. Pt is very eager and motivated to mobilize-discussed this with RN and requested they try to walk with him more if time allows. Will plan to follow during hospital stay. Recommend pt resume OP PT once he feels ready.        Recommendations for follow up therapy are one component of a multi-disciplinary discharge planning process, led by the attending physician.  Recommendations may be updated based on patient status, additional functional criteria and insurance authorization.  Follow Up Recommendations  resume OP PT    Assistance Recommended at Discharge PRN  Patient can return home with the following  A little help with walking and/or transfers;Help with stairs or ramp for entrance    Equipment Recommendations None recommended by PT  Recommendations for Other Services       Functional Status Assessment Patient has had a recent decline in their functional status and demonstrates the ability to make significant improvements in function in a reasonable and predictable amount of time.     Precautions / Restrictions Precautions Precautions: Fall Precaution Comments: R residual weakness UE/LE; R foot AFO Restrictions Weight Bearing Restrictions: No      Mobility  Bed Mobility               General bed mobility comments: oob in recliner    Transfers Overall transfer level: Needs assistance Equipment used: Hemi-walker Transfers: Sit to/from Stand Sit to Stand: Supervision           General transfer comment: Supv for safety, lines     Ambulation/Gait Ambulation/Gait assistance: Supervision Gait Distance (Feet): 150 Feet Assistive device: Hemi-walker         General Gait Details: Supv for safety. Mild dyspnea and fatigue. O2 92% on RA. No LOB with hemiwalker use. Good clearance of R foot with AFO use  Stairs            Wheelchair Mobility    Modified Rankin (Stroke Patients Only)       Balance Overall balance assessment: Mild deficits observed, not formally tested                                           Pertinent Vitals/Pain Pain Assessment Pain Assessment: No/denies pain    Home Living Family/patient expects to be discharged to:: Private residence Living Arrangements: Other relatives Available Help at Discharge: Family;Available 24 hours/day Type of Home: House Home Access: Stairs to enter   [employer] of Steps: curb   Home Layout: One level Home Equipment: Agricultural consultant (2 wheels);Cane - single point;Shower seat Additional Comments: Hemi-walker; AFO    Prior Function Prior Level of Function : Needs assist             Mobility Comments: Uses hemi-walker - walks in house, to car; limited community ADLs Comments: Pt has help with ADLs - dressing and bathing     Hand Dominance   Dominant Hand: Right    Extremity/Trunk Assessment  Upper Extremity Assessment Upper Extremity Assessment: RUE deficits/detail RUE Deficits / Details: no functional use of R UE; R UE contracted    Lower Extremity Assessment Lower Extremity Assessment: RLE deficits/detail RLE Deficits / Details: able to weightbear with AFO RLE Coordination: decreased fine motor;decreased gross motor    Cervical / Trunk Assessment Cervical / Trunk Assessment: Normal  Communication   Communication: No difficulties  Cognition Arousal/Alertness: Awake/alert Behavior During Therapy: WFL for tasks assessed/performed Overall Cognitive Status: Within Functional Limits for tasks  assessed                                          General Comments      Exercises     Assessment/Plan    PT Assessment Patient needs continued PT services  PT Problem List Decreased mobility;Impaired tone;Decreased coordination       PT Treatment Interventions DME instruction;Gait training;Therapeutic activities;Therapeutic exercise;Patient/family education;Functional mobility training    PT Goals (Current goals can be found in the Care Plan section)  Acute Rehab PT Goals Patient Stated Goal: to be able to mobilize more PT Goal Formulation: With patient Time For Goal Achievement: 11/15/21 Potential to Achieve Goals: Good    Frequency Min 3X/week     Co-evaluation               AM-PAC PT "6 Clicks" Mobility  Outcome Measure Help needed turning from your back to your side while in a flat bed without using bedrails?: A Little Help needed moving from lying on your back to sitting on the side of a flat bed without using bedrails?: A Little Help needed moving to and from a bed to a chair (including a wheelchair)?: A Little Help needed standing up from a chair using your arms (e.g., wheelchair or bedside chair)?: A Little Help needed to walk in hospital room?: A Little Help needed climbing 3-5 steps with a railing? : A Little 6 Click Score: 18    End of Session Equipment Utilized During Treatment: Gait belt Activity Tolerance: Patient tolerated treatment well Patient left: in chair;with call bell/phone within reach   PT Visit Diagnosis: Difficulty in walking, not elsewhere classified (R26.2);Other symptoms and signs involving the nervous system (R29.898)    Time: 3419-3790 PT Time Calculation (min) (ACUTE ONLY): 15 min   Charges:   PT Evaluation $PT Eval Moderate Complexity: 1 Mod            Faye Ramsay, PT Acute Rehabilitation  Office: [phone] Pager: [phone]

## 2021-11-02 ENCOUNTER — Ambulatory Visit: Payer: Medicaid Other | Admitting: Physical Therapy

## 2021-11-02 ENCOUNTER — Other Ambulatory Visit (HOSPITAL_COMMUNITY): Payer: Self-pay

## 2021-11-02 DIAGNOSIS — N179 Acute kidney failure, unspecified: Secondary | ICD-10-CM | POA: Diagnosis not present

## 2021-11-02 DIAGNOSIS — I2699 Other pulmonary embolism without acute cor pulmonale: Secondary | ICD-10-CM | POA: Diagnosis not present

## 2021-11-02 DIAGNOSIS — R0600 Dyspnea, unspecified: Secondary | ICD-10-CM | POA: Diagnosis not present

## 2021-11-02 DIAGNOSIS — R7989 Other specified abnormal findings of blood chemistry: Secondary | ICD-10-CM | POA: Diagnosis not present

## 2021-11-02 LAB — BASIC METABOLIC PANEL
Anion gap: 9 (ref 5–15)
BUN: 18 mg/dL (ref 6–20)
CO2: 18 mmol/L — ABNORMAL LOW (ref 22–32)
Calcium: 9.3 mg/dL (ref 8.9–10.3)
Chloride: 110 mmol/L (ref 98–111)
Creatinine, Ser: 0.92 mg/dL (ref 0.61–1.24)
GFR, Estimated: >60 mL/min (ref >60)
Glucose, Bld: 114 mg/dL — ABNORMAL HIGH (ref 70–99)
Potassium: 4.5 mmol/L (ref 3.5–5.1)
Sodium: 137 mmol/L (ref 135–145)

## 2021-11-02 LAB — CBC
HCT: 32.4 % — ABNORMAL LOW (ref 39.0–52.0)
Hemoglobin: 10.2 g/dL — ABNORMAL LOW (ref 13.0–17.0)
MCH: 28.7 pg (ref 26.0–34.0)
MCHC: 31.5 g/dL (ref 30.0–36.0)
MCV: 91 fL (ref 80.0–100.0)
Platelets: 354 10*3/uL (ref 150–400)
RBC: 3.56 MIL/uL — ABNORMAL LOW (ref 4.22–5.81)
RDW: 16.7 % — ABNORMAL HIGH (ref 11.5–15.5)
WBC: 4.7 10*3/uL (ref 4.0–10.5)
nRBC: 0 % (ref 0.0–0.2)

## 2021-11-02 LAB — MAGNESIUM: Magnesium: 1.9 mg/dL (ref 1.7–2.4)

## 2021-11-02 MED ORDER — PANTOPRAZOLE SODIUM 40 MG PO TBEC
40.0000 mg | DELAYED_RELEASE_TABLET | Freq: Every day | ORAL | 0 refills | Status: AC
  Filled 2021-11-02: qty 30, 30d supply, fill #0

## 2021-11-02 MED ORDER — ALBUTEROL SULFATE HFA 108 (90 BASE) MCG/ACT IN AERS
2.0000 | INHALATION_SPRAY | Freq: Four times a day (QID) | RESPIRATORY_TRACT | 2 refills | Status: AC | PRN
  Filled 2021-11-02: qty 18, 17d supply, fill #0

## 2021-11-02 MED ORDER — OXYCODONE HCL 5 MG PO TABS
5.0000 mg | ORAL_TABLET | ORAL | Status: DC | PRN
  Administered 2021-11-02 (×2): 10 mg via ORAL
  Filled 2021-11-02 (×2): qty 2
  Filled 2021-11-02: qty 1

## 2021-11-02 MED ORDER — RIVAROXABAN (XARELTO) VTE STARTER PACK (15 & 20 MG)
ORAL_TABLET | ORAL | 0 refills | Status: AC
  Filled 2021-11-02: qty 51, 30d supply, fill #0

## 2021-11-02 MED ORDER — MUSCLE RUB 10-15 % EX CREA
TOPICAL_CREAM | CUTANEOUS | Status: DC | PRN
  Filled 2021-11-02: qty 85

## 2021-11-02 NOTE — Progress Notes (Signed)
pt states oxycodone 5mg  is not helping with his back pain. He said the medication doesn't last and he would like to know if his dose can be increase or if he can get something else to help with his back pain. Oxy last given at 2110 and is ordered q4hrs so he's not due until after 0110 and he's c/o 10/10 pain right now. Provider paged, see new orders.

## 2021-11-02 NOTE — Progress Notes (Signed)
Pt discharged to home instructions reviewed with pt. Pt given evening dose of Xalrelto. Meds reviewed with pt and acknowledged undestanding. SRP,RN

## 2021-11-02 NOTE — Discharge Summary (Signed)
Physician Discharge Summary  Kurt Foley NKN:[id number] DOB: Jan 11, 1976 DOA: 10/29/2021  PCP: Gildardo Pounds, NP  Admit date: 10/29/2021 Discharge date: 11/02/2021  Admitted From: Home Disposition: Home  Recommendations for Outpatient Follow-up:  Follow up with PCP in 1-2 weeks Please obtain BMP/CBC in one week your next doctors visit.  Xarelto has been prescribed for his DVT.  Dosing as prescribed.   He is going to have to follow-up outpatient with pulmonary for navigational bronchoscopy to evaluate his right upper lobe lung lesion. Bronchodilators prescribed Prophylactic PPI prescribed while he is on anticoagulation and aspirin.    Discharge Condition: Stable CODE STATUS: Full code Diet recommendation: Regula  Brief/Interim Summary: 46 year old with history of HIV/AIDS, PML, disseminated MAC, prior CVA with residual right-sided weakness admitted for shortness of breath.  He had CTA done last month for shortness of breath as well and was negative for PE but showed progression of masslike opacity in the right upper lobe and was also treated for community-acquired pneumonia with plans of navigational bronchoscopy as outpatient.  During this admission CTA revealed bilateral pulmonary embolism with cor pulmonale, pulmonary team was consulted and he was started on anticoagulation.   Over the course of several days his tachycardia and shortness of breath significantly improved.  No evidence of bleeding was identified, he was transitioned to oral Xarelto without any issues. Patient is medically stable to be discharged     Assessment & Plan:  Principal Problem:   Pulmonary embolism ([place]) Active Problems:   Mass of upper lobe of right lung   Human immunodeficiency virus (HIV) disease (HCC)   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HCC)   Abnormal LFTs   History of CVA with residual right hemiparesis   Dyspnea   AKI (acute kidney injury) ([place])       Assessment and  Plan: * Pulmonary embolism ([place]) without cor pulmonale Right lower extremity DVT Presented with dyspnea with CTA chest revealing bilateral pulmonary emboli with evidence of right heart strain. But echocardiogram did not show evidence of RV dysfunction.  Heparin drip transition to oral Xarelto. Seen by CCM Echocardiogram-Echo shows 55%, grade 1 DD, normal RV function Lower extremity Dopplers-right lower extremity DVT Procalcitonin unequivocal-we will stop antibiotics.   Mass of upper lobe of right lung - Seen by pulmonary, outpatient bronchoscopy will need to be rescheduled.  This will be arranged by pulmonary team outpatient.   Abnormal LFTs Continue to monitor for now   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[place]) On ethambutol   Human immunodeficiency virus (HIV) disease ([place]) Continue home Biktarvy, bactrim ethambutol.  Follows outpatient infectious disease -Last CD4 count on 5/10 of 46   History of CVA with residual right hemiparesis Has ambulatory PT referral from recent admission.   Dyspnea Resolved.   AKI (acute kidney injury) ([place]), resolved Baseline 0.8, admission 1.08.  This is not resolved.             Body mass index is 24.52 kg/m.       Discharge Diagnoses:  Principal Problem:   Pulmonary embolism (HCC) Active Problems:   Mass of upper lobe of right lung   Human immunodeficiency virus (HIV) disease (HCC)   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[place])   Abnormal LFTs   History of CVA with residual right hemiparesis   Dyspnea   AKI (acute kidney injury) ([place])      Consultations: Pulmonary  Subjective: Feels great no complaints.  Denies any shortness of breath even with exertion.  No chest pain, lightheadedness.  Discharge Exam: Vitals:   11/02/21 0434 11/02/21 0813  BP: 107/78   Pulse: 99   Resp: 20   Temp: 97.6 F (36.4 C)   SpO2: 99% 95%   Vitals:   11/01/21 2127 11/02/21 0052 11/02/21 0434 11/02/21  0813  BP: 110/86 115/81 107/78   Pulse: 99 100 99   Resp: 18 (!) 24 20   Temp: 98.6 F (37 C) (!) 97.5 F (36.4 C) 97.6 F (36.4 C)   TempSrc: Oral Oral Oral   SpO2: 98% 98% 99% 95%  Weight:      Height:        General: Pt is alert, awake, not in acute distress Cardiovascular: RRR, S1/S2 +, no rubs, no gallops Respiratory: CTA bilaterally, no wheezing, no rhonchi Abdominal: Soft, NT, ND, bowel sounds + Extremities: no edema, no cyanosis  Discharge Instructions   Allergies as of 11/02/2021       Reactions   Lidocaine Other (See Comments)   Large bruise at site of patch placement        Medication List     STOP taking these medications    fluconazole 200 MG tablet Commonly known as: DIFLUCAN       TAKE these medications    acetaminophen 325 MG tablet Commonly known as: TYLENOL Take 1-2 tablets (325-650 mg total) by mouth every 4 (four) hours as needed for mild pain.   albuterol 108 (90 Base) MCG/ACT inhaler Commonly known as: VENTOLIN HFA Inhale 2 puffs into the lungs every 6 hours as needed for wheezing or shortness of breath.   Aspirin Low Dose 81 MG chewable tablet Generic drug: aspirin Chew 1 tablet (81 mg total) by mouth daily.   azithromycin 500 MG tablet Commonly known as: ZITHROMAX Take 1 tablet by mouth at bedtime.   bictegravir-emtricitabine-tenofovir AF 50-200-25 MG Tabs tablet Commonly known as: BIKTARVY Take 1 tablet by mouth daily. What changed: when to take this   CertaVite/Antioxidants Tabs Take 1 tablet by mouth daily.   ethambutol 400 MG tablet Commonly known as: MYAMBUTOL Take 2 tablets by mouth daily.   loratadine 10 MG tablet Commonly known as: CLARITIN Take 1 tablet (10 mg total) by mouth daily. What changed: when to take this   megestrol 40 MG/ML suspension Commonly known as: MEGACE Take 10 mLs (400 mg total) by mouth daily. What changed: when to take this   metoprolol succinate 50 MG 24 hr tablet Commonly known  as: TOPROL-XL Take 1 tablet by mouth every morning. Take with or immediately following a meal.   mirtazapine 15 MG tablet Commonly known as: Remeron Take 1 tablet (15 mg total) by mouth at bedtime.   nystatin cream Commonly known as: MYCOSTATIN Apply 1 application. topically 2 (two) times daily. What changed:  when to take this reasons to take this   pantoprazole 40 MG tablet Commonly known as: PROTONIX Take 1 tablet by mouth daily before breakfast.   polyethylene glycol 17 g packet Commonly known as: MIRALAX / GLYCOLAX Take 17 g by mouth daily as needed for mild constipation or moderate constipation.   QUEtiapine 200 MG tablet Commonly known as: SEROquel Take 1 tablet (200 mg total) by mouth at bedtime.   Rivaroxaban Stater Pack (15 mg and 20 mg) Commonly known as: XARELTO STARTER PACK Follow package directions: Take one 78m tablet by mouth twice a day. On day 22, switch to one 243mtablet once a day. Take with food.   rosuvastatin 5 MG tablet Commonly known as: Crestor  Take 1 tablet (5 mg total) by mouth daily. What changed: when to take this   Senexon-S 8.6-50 MG tablet Generic drug: senna-docusate Take 1 tablet by mouth 2 (two) times daily.   sulfamethoxazole-trimethoprim 800-160 MG tablet Commonly known as: BACTRIM DS Take 1 tablet by mouth daily.   Symbicort 160-4.5 MCG/ACT inhaler Generic drug: budesonide-formoterol Inhale 2 puffs into the lungs in the morning and at bedtime.   umeclidinium bromide 62.5 MCG/ACT Aepb Commonly known as: INCRUSE ELLIPTA Inhale 1 puff into the lungs daily.        Follow-up Information     Gildardo Pounds, NP Follow up in 1 week(s).   Specialty: Nurse Practitioner Contact information: [place] [address] [phone]                Allergies  Allergen Reactions   Lidocaine Other (See Comments)    Large bruise at site of patch placement    You were cared for by a hospitalist during  your hospital stay. If you have any questions about your discharge medications or the care you received while you were in the hospital after you are discharged, you can call the unit and asked to speak with the hospitalist on call if the hospitalist that took care of you is not available. Once you are discharged, your primary care physician will handle any further medical issues. Please note that no refills for any discharge medications will be authorized once you are discharged, as it is imperative that you return to your primary care physician (or establish a relationship with a primary care physician if you do not have one) for your aftercare needs so that they can reassess your need for medications and monitor your lab values.   Procedures/Studies: CT Angio Chest PE W and/or Wo Contrast  Result Date: 10/29/2021 CLINICAL DATA:  Concern for pulmonary embolism. EXAM: CT ANGIOGRAPHY CHEST WITH CONTRAST TECHNIQUE: Multidetector CT imaging of the chest was performed using the standard protocol during bolus administration of intravenous contrast. Multiplanar CT image reconstructions and MIPs were obtained to evaluate the vascular anatomy. RADIATION DOSE REDUCTION: This exam was performed according to the departmental dose-optimization program which includes automated exposure control, adjustment of the mA and/or kV according to patient size and/or use of iterative reconstruction technique. CONTRAST:  43m OMNIPAQUE IOHEXOL 350 MG/ML SOLN COMPARISON:  Chest CT dated 10/06/2021 and radiograph dated 10/29/2021. FINDINGS: Evaluation of this exam is limited due to respiratory motion artifact. Cardiovascular: There is no cardiomegaly or pericardial effusion. There is coronary vascular calcification, advanced for the patient's age. Mild atherosclerotic calcification of the thoracic aorta. No aneurysmal dilatation or dissection. Evaluation of the pulmonary arteries is limited due to respiratory motion artifact. Bilateral  pulmonary artery emboli involving the lobar branches of the right lower lobe extending into the segmental branches as well as in the left upper lobar branch. The right ventricular lumen measures approximately 4.0 cm and the left ventricular lumen measures 2.8 cm with RV/LV ratio of 1.4 consistent with right heart straining. Correlation with clinical exam and echocardiogram recommended. Mediastinum/Nodes: Similar appearance of bilateral hilar and mediastinal soft tissue masses or adenopathy measuring approximately 2 x 4 cm in the prevascular space. Subcarinal lymph node measures 16 mm in short axis. The esophagus is grossly unremarkable. No mediastinal fluid collection. Lungs/Pleura: Background of moderate emphysema. Similar appearance of right upper lobe subpleural masslike density measuring 3.3 x 1.4 cm as well as a 9 mm nodular density in the left upper  lobe, unchanged since the study of 10/06/2021. Left lung base linear atelectasis/scarring. No new consolidation. There is no pleural effusion or pneumothorax. The central airways are patent. Upper Abdomen: No acute abnormality. Musculoskeletal: No chest wall abnormality. No acute or significant osseous findings. Review of the MIP images confirms the above findings. IMPRESSION: 1. Bilateral pulmonary artery emboli with CT evidence of right heart straining (RV/LV Ratio = 1.4) consistent with at least submassive (intermediate risk) PE. The presence of right heart strain has been associated with an increased risk of morbidity and mortality. 2. Similar appearance of bilateral hilar and mediastinal soft tissue masses or adenopathy as well as bilateral upper lobe pulmonary nodules. 3. Coronary vascular calcification, advanced for the patient's age. 4. Aortic Atherosclerosis (ICD10-I70.0) and Emphysema (ICD10-J43.9). These results were called by telephone at the time of interpretation on 10/29/2021 at 7:28 pm to provider Theodis Blaze , who verbally acknowledged these  results. Electronically Signed   By: Anner Crete M.D.   On: 10/29/2021 19:28   CT Angio Chest PE W/Cm &/Or Wo Cm  Result Date: 10/06/2021 CLINICAL DATA:  46 year old male with shortness of breath for 1 week. Negative for COVID-19 and influenza. History of HIV. EXAM: CT ANGIOGRAPHY CHEST CT ABDOMEN AND PELVIS WITH CONTRAST TECHNIQUE: Multidetector CT imaging of the chest was performed using the standard protocol during bolus administration of intravenous contrast. Multiplanar CT image reconstructions and MIPs were obtained to evaluate the vascular anatomy. Multidetector CT imaging of the abdomen and pelvis was performed using the standard protocol during bolus administration of intravenous contrast. RADIATION DOSE REDUCTION: This exam was performed according to the departmental dose-optimization program which includes automated exposure control, adjustment of the mA and/or kV according to patient size and/or use of iterative reconstruction technique. CONTRAST:  165m OMNIPAQUE IOHEXOL 350 MG/ML SOLN COMPARISON:  [facility]2021. [facility]. FINDINGS: CTA CHEST FINDINGS Cardiovascular: Adequate contrast bolus timing in the pulmonary arterial tree. Mild respiratory motion. More pronounced respiratory motion in the lower lobes degrading subsegmental and distal lower lobe branch detail. But elsewhere, no convincing pulmonary artery filling defect. Calcified coronary artery atherosclerosis on series 5, image 113. No cardiomegaly or pericardial effusion. Mild Calcified aortic atherosclerosis. Mediastinum/Nodes: Regressed but not resolved mediastinal lymphadenopathy since last year. Abnormal prevertebral lymph node now 15 mm short axis (previously 19 mm). No new or increased mediastinal mass. Lungs/Pleura: Severe upper lobe paraseptal and centrilobular emphysema,  bullous changes greater on the left. Superimposed new confluent and nodular peripheral right upper lung opacity up to 3.5 cm by 5 cm since October (series 6, image 27 and series 7, image 66). This is in an area of paraseptal emphysema which was mildly nodular last year. And this lesion has mildly progressed since January this year (series 6, image 27 now versus series 8, image 817in January Elsewhere lung parenchyma appears stable.  No pleural effusion. Musculoskeletal: No acute osseous abnormality identified. Review of the MIP images confirms the above findings. CT ABDOMEN and PELVIS FINDINGS Hepatobiliary: Stable, negative. Pancreas: Negative. Spleen: Negative.  No splenomegaly. Adrenals/Urinary Tract: Stable, negative. Incidental pelvic phleboliths. Stomach/Bowel: No bowel obstruction or inflammation identified. Cecum is on a lax mesentery. Diminutive and normal appendix. No free air or free fluid. Stomach is decompressed. Vascular/Lymphatic: Age advanced Aortoiliac calcified atherosclerosis. But major arterial structures remain patent. Normal caliber abdominal aorta. Substantially regressed lymphadenopathy in the abdomen and  pelvis since January. Retroperitoneal lymph nodes at the level of the kidneys are now up to 12-14 mm short axis individually. Reproductive: Negative. Other: No pelvic free fluid. Musculoskeletal: No acute or suspicious osseous lesion identified. Review of the MIP images confirms the above findings. IMPRESSION: 1. Respiratory motion degrades detail of lower lobe pulmonary arteries. No acute pulmonary embolus identified. 2. Emphysema (ICD10-J43.9) with progressed Mass-like peripheral Right Upper Lobe opacity since January. This is now more suspicious for Malignancy than unresolved Pneumonia. No pleural effusion. 3. But at the same time the previous widespread lymphadenopathy in the chest and abdomen has regressed - compatible with regression of an HIV related lymphoproliferative disorder or  leukemia/lymphoma. 4. No other acute or inflammatory process in the chest, abdomen, or pelvis. 5. Calcified coronary artery atherosclerosis and Aortic Atherosclerosis (ICD10-I70.0). Electronically Signed   By: Genevie Ann M.D.   On: 10/06/2021 05:55   CT Abdomen Pelvis W Contrast  Result Date: 10/06/2021 CLINICAL DATA:  46 year old male with shortness of breath for 1 week. Negative for COVID-19 and influenza. History of HIV. EXAM: CT ANGIOGRAPHY CHEST CT ABDOMEN AND PELVIS WITH CONTRAST TECHNIQUE: Multidetector CT imaging of the chest was performed using the standard protocol during bolus administration of intravenous contrast. Multiplanar CT image reconstructions and MIPs were obtained to evaluate the vascular anatomy. Multidetector CT imaging of the abdomen and pelvis was performed using the standard protocol during bolus administration of intravenous contrast. RADIATION DOSE REDUCTION: This exam was performed according to the departmental dose-optimization program which includes automated exposure control, adjustment of the mA and/or kV according to patient size and/or use of iterative reconstruction technique. CONTRAST:  148m OMNIPAQUE IOHEXOL 350 MG/ML SOLN COMPARISON:  [facility] chest [facility]07/2021. [facility], [facility]20[facility]. FINDINGS: CTA CHEST FINDINGS Cardiovascular: Adequate contrast bolus timing in the pulmonary arterial tree. Mild respiratory motion. More pronounced respiratory motion in the lower lobes degrading subsegmental and distal lower lobe branch detail. But elsewhere, no convincing pulmonary artery filling defect. Calcified coronary artery atherosclerosis on series 5, image 113. No cardiomegaly or pericardial effusion. Mild Calcified aortic atherosclerosis. Mediastinum/Nodes: Regressed but not resolved mediastinal lymphadenopathy since last year. Abnormal prevertebral  lymph node now 15 mm short axis (previously 19 mm). No new or increased mediastinal mass. Lungs/Pleura: Severe upper lobe paraseptal and centrilobular emphysema, bullous changes greater on the left. Superimposed new confluent and nodular peripheral right upper lung opacity up to 3.5 cm by 5 cm since October (series 6, image 27 and series 7, image 66). This is in an area of paraseptal emphysema which was mildly nodular last year. And this lesion has mildly progressed since January this year (series 6, image 27 now versus series 8, image 821in January Elsewhere lung parenchyma appears stable.  No pleural effusion. Musculoskeletal: No acute osseous abnormality identified. Review of the MIP images confirms the above findings. CT ABDOMEN and PELVIS FINDINGS Hepatobiliary: Stable, negative. Pancreas: Negative. Spleen: Negative.  No splenomegaly. Adrenals/Urinary Tract: Stable, negative. Incidental pelvic phleboliths. Stomach/Bowel: No bowel obstruction or inflammation identified. Cecum is on a lax mesentery. Diminutive and normal appendix. No free air or free fluid. Stomach is decompressed. Vascular/Lymphatic: Age advanced Aortoiliac calcified atherosclerosis. But major arterial structures remain patent. Normal caliber abdominal aorta. Substantially regressed lymphadenopathy in the abdomen and pelvis since January. Retroperitoneal lymph nodes at the level of the kidneys are now up to 12-14 mm short axis individually. Reproductive:  Negative. Other: No pelvic free fluid. Musculoskeletal: No acute or suspicious osseous lesion identified. Review of the MIP images confirms the above findings. IMPRESSION: 1. Respiratory motion degrades detail of lower lobe pulmonary arteries. No acute pulmonary embolus identified. 2. Emphysema (ICD10-J43.9) with progressed Mass-like peripheral Right Upper Lobe opacity since January. This is now more suspicious for Malignancy than unresolved Pneumonia. No pleural effusion. 3. But at the same  time the previous widespread lymphadenopathy in the chest and abdomen has regressed - compatible with regression of an HIV related lymphoproliferative disorder or leukemia/lymphoma. 4. No other acute or inflammatory process in the chest, abdomen, or pelvis. 5. Calcified coronary artery atherosclerosis and Aortic Atherosclerosis (ICD10-I70.0). Electronically Signed   By: Genevie Ann M.D.   On: 10/06/2021 05:55   DG Chest Port 1 View  Result Date: 10/29/2021 CLINICAL DATA:  Questionable sepsis. EXAM: PORTABLE CHEST 1 VIEW COMPARISON:  Chest x-ray 10/06/2021.  CT chest 10/06/2021. FINDINGS: The heart size and mediastinal contours are within normal limits. Focal opacity in the right upper lobe appears unchanged. Lungs are otherwise clear. The visualized skeletal structures are unremarkable. IMPRESSION: Stable exam.  No acute cardiopulmonary process. Electronically Signed   By: Ronney Asters M.D.   On: 10/29/2021 18:37   DG Chest Portable 1 View  Result Date: 10/06/2021 CLINICAL DATA:  Shortness of breath EXAM: PORTABLE CHEST 1 VIEW COMPARISON:  Chest radiograph dated 09/14/2021. CT chest dated 06/28/2021. FINDINGS: Subpleural scarring in the right upper lobe, unchanged. Linear scarring/atelectasis in the lingula/left lower lobe. No focal consolidation. No pleural effusion or pneumothorax. The heart is normal in size. IMPRESSION: No evidence of acute cardiopulmonary disease. Electronically Signed   By: Julian Hy M.D.   On: 10/06/2021 02:57   ECHOCARDIOGRAM COMPLETE  Result Date: 10/30/2021    ECHOCARDIOGRAM REPORT   Patient Name:   Kurt Foley [facility] Date of Exam: 10/30/2021 Medical Rec #:  [id number]         Height:       66.0 in Accession #:    [id number]        Weight:       151.9 lb Date of Birth:  [birth year]        BSA:          1.779 m Patient Age:    69 years          BP:           114/90 mmHg Patient Gender: M                 HR:           97 bpm. Exam Location:  Inpatient Procedure: 2D Echo,  Cardiac Doppler and Color Doppler Indications:    Pulmonary embolism  History:        Patient has no prior history of Echocardiogram examinations.                 Signs/Symptoms:Dyspnea.  Sonographer:    Joette Catching RCS Referring Phys: [id number] [place]  1. Left ventricular ejection fraction, by estimation, is 55 to 60%. Left ventricular ejection fraction by 3D volume is 54 %. The left ventricle has normal function. The left ventricle has no regional wall motion abnormalities. Left ventricular diastolic  parameters are consistent with Grade I diastolic dysfunction (impaired relaxation).  2. Right ventricular systolic function is normal. The right ventricular size is normal.  3. The mitral valve is normal in structure. Trivial mitral valve regurgitation. No evidence of  mitral stenosis.  4. The aortic valve is normal in structure. Aortic valve regurgitation is not visualized. No aortic stenosis is present.  5. The inferior vena cava is normal in size with greater than 50% respiratory variability, suggesting right atrial pressure of 3 mmHg. FINDINGS  Left Ventricle: Left ventricular ejection fraction, by estimation, is 55 to 60%. Left ventricular ejection fraction by 3D volume is 54 %. The left ventricle has normal function. The left ventricle has no regional wall motion abnormalities. The left ventricular internal cavity size was normal in size. There is no left ventricular hypertrophy. Left ventricular diastolic parameters are consistent with Grade I diastolic dysfunction (impaired relaxation). Normal left ventricular filling pressure. Right Ventricle: The right ventricular size is normal. No increase in right ventricular wall thickness. Right ventricular systolic function is normal. Left Atrium: Left atrial size was normal in size. Right Atrium: Right atrial size was normal in size. Pericardium: There is no evidence of pericardial effusion. Mitral Valve: The mitral valve is normal in  structure. Trivial mitral valve regurgitation. No evidence of mitral valve stenosis. Tricuspid Valve: The tricuspid valve is normal in structure. Tricuspid valve regurgitation is trivial. No evidence of tricuspid stenosis. Aortic Valve: The aortic valve is normal in structure. Aortic valve regurgitation is not visualized. No aortic stenosis is present. Aortic valve mean gradient measures 4.0 mmHg. Aortic valve peak gradient measures 7.2 mmHg. Aortic valve area, by VTI measures 2.33 cm. Pulmonic Valve: The pulmonic valve was normal in structure. Pulmonic valve regurgitation is trivial. No evidence of pulmonic stenosis. Aorta: The aortic root is normal in size and structure. Venous: The inferior vena cava is normal in size with greater than 50% respiratory variability, suggesting right atrial pressure of 3 mmHg. IAS/Shunts: No atrial level shunt detected by color flow Doppler.  LEFT VENTRICLE PLAX 2D LVIDd:         3.75 cm         Diastology LVIDs:         2.60 cm         LV e' medial:    8.49 cm/s LV PW:         0.90 cm         LV E/e' medial:  7.4 LV IVS:        1.00 cm         LV e' lateral:   10.20 cm/s LVOT diam:     2.10 cm         LV E/e' lateral: 6.1 LV SV:         52 LV SV Index:   29 LVOT Area:     3.46 cm        3D Volume EF                                LV 3D EF:    Left                                             ventricul LV Volumes (MOD)                            ar LV vol d, MOD    37.9 ml  ejection A2C:                                        fraction LV vol d, MOD    45.9 ml                    by 3D A4C:                                        volume is LV vol s, MOD    18.8 ml                    54 %. A2C: LV vol s, MOD    22.1 ml A4C:                           3D Volume EF: LV SV MOD A2C:   19.1 ml       3D EF:        54 % LV SV MOD A4C:   45.9 ml       LV EDV:       88 ml LV SV MOD BP:    21.4 ml       LV ESV:       41 ml                                LV SV:        47 ml RIGHT  VENTRICLE             IVC RV Basal diam:  2.10 cm     IVC diam: 1.70 cm RV Mid diam:    1.70 cm RV S prime:     13.10 cm/s TAPSE (M-mode): 2.0 cm LEFT ATRIUM             Index        RIGHT ATRIUM           Index LA diam:        3.30 cm 1.85 cm/m   RA Area:     13.30 cm LA Vol (A2C):   32.8 ml 18.44 ml/m  RA Volume:   29.20 ml  16.41 ml/m LA Vol (A4C):   31.3 ml 17.59 ml/m LA Biplane Vol: 35.3 ml 19.84 ml/m  AORTIC VALVE                    PULMONIC VALVE AV Area (Vmax):    2.41 cm     PV Vmax:          0.95 m/s AV Area (Vmean):   2.67 cm     PV Peak grad:     3.6 mmHg AV Area (VTI):     2.33 cm     PR End Diast Vel: 5.66 msec AV Vmax:           134.00 cm/s AV Vmean:          96.600 cm/s AV VTI:            0.223 m AV Peak Grad:      7.2 mmHg AV Mean Grad:      4.0 mmHg LVOT Vmax:  93.30 cm/s LVOT Vmean:        74.400 cm/s LVOT VTI:          0.150 m LVOT/AV VTI ratio: 0.67  AORTA Ao Root diam: 2.70 cm Ao Asc diam:  3.50 cm MITRAL VALVE               TRICUSPID VALVE MV Area (PHT): 6.17 cm    TR Peak grad:   13.8 mmHg MV Decel Time: 123 msec    TR Vmax:        186.00 cm/s MV E velocity: 62.60 cm/s MV A velocity: 74.10 cm/s  SHUNTS MV E/A ratio:  0.84        Systemic VTI:  0.15 m                            Systemic Diam: 2.10 cm Sanda Klein MD Electronically signed by Sanda Klein MD Signature Date/Time: 10/30/2021/11:44:47 AM    Final    VAS [place] LOWER EXTREMITY VENOUS (DVT)  Result Date: 10/31/2021  Lower Venous DVT Study Patient Name:  [facility]oley [facility]  Date of Exam:   10/30/2021 Medical Rec #: [id number]          Accession #:    [id number] Date of Birth: 03/08/1976         Patient Gender: M Patient Age:   81 years Exam Location:  [facility] Procedure:      VAS [place] LOWER EXTREMITY VENOUS (DVT) Referring Phys: CHING TU --------------------------------------------------------------------------------  Indications: Pulmonary embolism.  Risk Factors: Confirmed PE. Anticoagulation: Heparin.  Limitations: Poor ultrasound/tissue interface and patient positioning, patient immobility. Comparison Study: No prior studies. Performing Technologist: Oliver Hum RVT  Examination Guidelines: A complete evaluation includes B-mode imaging, spectral Doppler, color Doppler, and power Doppler as needed of all accessible portions of each vessel. Bilateral testing is considered an integral part of a complete examination. Limited examinations for reoccurring indications may be performed as noted. The reflux portion of the exam is performed with the patient in reverse Trendelenburg.  +---------+---------------+---------+-----------+----------+--------------+ RIGHT    CompressibilityPhasicitySpontaneityPropertiesThrombus Aging +---------+---------------+---------+-----------+----------+--------------+ CFV      Full           Yes      Yes                                 +---------+---------------+---------+-----------+----------+--------------+ SFJ      Full                                                        +---------+---------------+---------+-----------+----------+--------------+ FV Prox  Full                                                        +---------+---------------+---------+-----------+----------+--------------+ FV Mid   Full                                                        +---------+---------------+---------+-----------+----------+--------------+  FV DistalFull                                                        +---------+---------------+---------+-----------+----------+--------------+ PFV      Full                                                        +---------+---------------+---------+-----------+----------+--------------+ POP      Partial        No       No                   Acute          +---------+---------------+---------+-----------+----------+--------------+ PTV      Full                                                         +---------+---------------+---------+-----------+----------+--------------+ PERO     Partial                                      Acute          +---------+---------------+---------+-----------+----------+--------------+ Gastroc  Partial                                      Acute          +---------+---------------+---------+-----------+----------+--------------+   +---------+---------------+---------+-----------+----------+--------------+ LEFT     CompressibilityPhasicitySpontaneityPropertiesThrombus Aging +---------+---------------+---------+-----------+----------+--------------+ CFV      Full           Yes      Yes                                 +---------+---------------+---------+-----------+----------+--------------+ SFJ      Full                                                        +---------+---------------+---------+-----------+----------+--------------+ FV Prox  Full                                                        +---------+---------------+---------+-----------+----------+--------------+ FV Mid   Full                                                        +---------+---------------+---------+-----------+----------+--------------+ FV DistalFull                                                        +---------+---------------+---------+-----------+----------+--------------+  PFV      Full                                                        +---------+---------------+---------+-----------+----------+--------------+ POP      Full           Yes      Yes                                 +---------+---------------+---------+-----------+----------+--------------+ PTV      Full                                                        +---------+---------------+---------+-----------+----------+--------------+ PERO     Full                                                         +---------+---------------+---------+-----------+----------+--------------+     Summary: RIGHT: - Findings consistent with acute deep vein thrombosis involving the right popliteal vein, right peroneal veins, and right gastrocnemius veins. - No cystic structure found in the popliteal fossa.  LEFT: - There is no evidence of deep vein thrombosis in the lower extremity. However, portions of this examination were limited- see technologist comments above.  - No cystic structure found in the popliteal fossa.  *See table(s) above for measurements and observations. Electronically signed by Monica Martinez MD on 10/31/2021 at 5:08:55 PM.    Final      The results of significant diagnostics from this hospitalization (including imaging, microbiology, ancillary and laboratory) are listed below for reference.     Microbiology: Recent Results (from the past 240 hour(s))  Blood Culture (routine x 2)     Status: Abnormal   Collection Time: 10/29/21  5:44 PM   Specimen: BLOOD  Result Value Ref Range Status   Specimen Description   Final    BLOOD BLOOD LEFT HAND Performed at [facility] [address] [place], [place] [address]    Special Requests   Final    BOTTLES DRAWN AEROBIC AND ANAEROBIC Blood Culture adequate volume Performed at [facility] [address], [place], [place] [address]    Culture  Setup Time   Final    GRAM POSITIVE COCCI IN CLUSTERS AEROBIC BOTTLE ONLY CRITICAL RESULT CALLED TO, READ BACK BY AND VERIFIED WITH: PHARMD J.LEGGE AT 1952 ON 10/30/2021 BY T.SAAD.    Culture (A)  Final    STAPHYLOCOCCUS EPIDERMIDIS THE SIGNIFICANCE OF ISOLATING THIS ORGANISM FROM A SINGLE SET OF BLOOD CULTURES WHEN MULTIPLE SETS ARE DRAWN IS UNCERTAIN. PLEASE NOTIFY THE MICROBIOLOGY DEPARTMENT WITHIN ONE WEEK IF SPECIATION AND SENSITIVITIES ARE REQUIRED. Performed at [facility], [place] [address], [place], [place] [address]    Report Status 10/31/2021 FINAL   Final  Blood Culture ID Panel (Reflexed)     Status: Abnormal   Collection Time: 10/29/21  5:44 PM  Result Value Ref Range Status  Enterococcus faecalis NOT DETECTED NOT DETECTED Final   Enterococcus Faecium NOT DETECTED NOT DETECTED Final   Listeria monocytogenes NOT DETECTED NOT DETECTED Final   Staphylococcus species DETECTED (A) NOT DETECTED Final    Comment: CRITICAL RESULT CALLED TO, READ BACK BY AND VERIFIED WITH: PHARMD J.LEGGE AT 1952 ON 10/30/2021 BY T.SAAD.    Staphylococcus aureus (BCID) NOT DETECTED NOT DETECTED Final   Staphylococcus epidermidis DETECTED (A) NOT DETECTED Final    Comment: Methicillin (oxacillin) resistant coagulase negative staphylococcus. Possible blood culture contaminant (unless isolated from more than one blood culture draw or clinical case suggests pathogenicity). No antibiotic treatment is indicated for blood  culture contaminants. CRITICAL RESULT CALLED TO, READ BACK BY AND VERIFIED WITH: PHARMD J.LEGGE AT 1952 ON 10/30/2021 BY T.SAAD.    Staphylococcus lugdunensis NOT DETECTED NOT DETECTED Final   Streptococcus species NOT DETECTED NOT DETECTED Final   Streptococcus agalactiae NOT DETECTED NOT DETECTED Final   Streptococcus pneumoniae NOT DETECTED NOT DETECTED Final   Streptococcus pyogenes NOT DETECTED NOT DETECTED Final   A.calcoaceticus-baumannii NOT DETECTED NOT DETECTED Final   Bacteroides fragilis NOT DETECTED NOT DETECTED Final   Enterobacterales NOT DETECTED NOT DETECTED Final   Enterobacter cloacae complex NOT DETECTED NOT DETECTED Final   Escherichia coli NOT DETECTED NOT DETECTED Final   Klebsiella aerogenes NOT DETECTED NOT DETECTED Final   Klebsiella oxytoca NOT DETECTED NOT DETECTED Final   Klebsiella pneumoniae NOT DETECTED NOT DETECTED Final   Proteus species NOT DETECTED NOT DETECTED Final   Salmonella species NOT DETECTED NOT DETECTED Final   Serratia marcescens NOT DETECTED NOT DETECTED Final   Haemophilus influenzae NOT  DETECTED NOT DETECTED Final   Neisseria meningitidis NOT DETECTED NOT DETECTED Final   Pseudomonas aeruginosa NOT DETECTED NOT DETECTED Final   Stenotrophomonas maltophilia NOT DETECTED NOT DETECTED Final   Candida albicans NOT DETECTED NOT DETECTED Final   Candida auris NOT DETECTED NOT DETECTED Final   Candida glabrata NOT DETECTED NOT DETECTED Final   Candida krusei NOT DETECTED NOT DETECTED Final   Candida parapsilosis NOT DETECTED NOT DETECTED Final   Candida tropicalis NOT DETECTED NOT DETECTED Final   Cryptococcus neoformans/gattii NOT DETECTED NOT DETECTED Final   Methicillin resistance mecA/C DETECTED (A) NOT DETECTED Final    Comment: CRITICAL RESULT CALLED TO, READ BACK BY AND VERIFIED WITH: PHARMD J.LEGGE AT 1952 ON 10/30/2021 BY T.SAAD. Performed at [facility], [place] [address], [place], [place] [address]   Urine Culture     Status: None   Collection Time: 10/29/21  5:59 PM   Specimen: In/Out Cath Urine  Result Value Ref Range Status   Specimen Description   Final    IN/OUT CATH URINE Performed at [facility] [address], [place], [place] [address]    Special Requests   Final    NONE Performed at [facility], [place] [address], [place], [place] [address]    Culture   Final    NO GROWTH Performed at [facility], [place] [address] [place], [place] [address]    Report Status 10/31/2021 FINAL  Final  Resp Panel by RT-PCR (Flu A&B, Covid)     Status: None   Collection Time: 10/29/21  6:24 PM  Result Value Ref Range Status   SARS Coronavirus 2 by RT PCR NEGATIVE NEGATIVE Final    Comment: (NOTE) SARS-CoV-2 target nucleic acids are NOT DETECTED.  The SARS-CoV-2 RNA is generally detectable in upper respiratory specimens during the  acute phase of infection. The lowest concentration of SARS-CoV-2 viral copies this assay can detect is 138 copies/mL. A negative result does not preclude SARS-Cov-2 infection and should not  be used as the sole basis for treatment or other patient management decisions. A negative result may occur with  improper specimen collection/handling, submission of specimen other than nasopharyngeal swab, presence of viral mutation(s) within the areas targeted by this assay, and inadequate number of viral copies(<138 copies/mL). A negative result must be combined with clinical observations, patient history, and epidemiological information. The expected result is Negative.  Fact Sheet for Patients:  EntrepreneurPulse.com.au  Fact Sheet for Healthcare Providers:  IncredibleEmployment.be  This test is no t yet approved or cleared by the [place] FDA and  has been authorized for detection and/or diagnosis of SARS-CoV-2 by FDA under an Emergency Use Authorization (EUA). This EUA will remain  in effect (meaning this test can be used) for the duration of the COVID-19 declaration under Section 564(b)(1) of the Act, 21 U.S.C.section 360bbb-3(b)(1), unless the authorization is terminated  or revoked sooner.       Influenza A by PCR NEGATIVE NEGATIVE Final   Influenza B by PCR NEGATIVE NEGATIVE Final    Comment: (NOTE) The Xpert Xpress SARS-CoV-2/FLU/RSV plus assay is intended as an aid in the diagnosis of influenza from Nasopharyngeal swab specimens and should not be used as a sole basis for treatment. Nasal washings and aspirates are unacceptable for Xpert Xpress SARS-CoV-2/FLU/RSV testing.  Fact Sheet for Patients: EntrepreneurPulse.com.au  Fact Sheet for Healthcare Providers: IncredibleEmployment.be  This test is not yet approved or cleared by the [place] FDA and has been authorized for detection and/or diagnosis of SARS-CoV-2 by FDA under an Emergency Use Authorization (EUA). This EUA will remain in effect (meaning this test can be used) for the duration of the COVID-19 declaration under Section  564(b)(1) of the Act, 21 U.S.C. section 360bbb-3(b)(1), unless the authorization is terminated or revoked.  Performed at [facility], [place] [address], [place], [place] [address]   Blood Culture (routine x 2)     Status: None (Preliminary result)   Collection Time: 10/29/21  7:27 PM   Specimen: BLOOD  Result Value Ref Range Status   Specimen Description   Final    BLOOD RIGHT ANTECUBITAL Performed at [facility] [address] [place], [place] [address]    Special Requests   Final    BOTTLES DRAWN AEROBIC AND ANAEROBIC Blood Culture adequate volume Performed at [facility] [address] [place], [place] [address]    Culture   Final    NO GROWTH 3 DAYS Performed at [facility], [place] [address], [place], [place] [address]    Report Status PENDING  Incomplete  MRSA Next Gen by PCR, Nasal     Status: None   Collection Time: 10/29/21 11:14 PM   Specimen: Nasal Mucosa; Nasal Swab  Result Value Ref Range Status   MRSA by PCR Next Gen NOT DETECTED NOT DETECTED Final    Comment: (NOTE) The GeneXpert MRSA Assay (FDA approved for NASAL specimens only), is one component of a comprehensive MRSA colonization surveillance program. It is not intended to diagnose MRSA infection nor to guide or monitor treatment for MRSA infections. Test performance is not FDA approved in patients less than 73 years old. Performed at [facility], [place] [address], [place], [place] [address]   Expectorated Sputum Assessment w Gram Stain, Rflx to Resp  Cult     Status: None   Collection Time: 10/30/21 12:34 PM   Specimen: Expectorated Sputum  Result Value Ref Range Status   Specimen Description EXPECTORATED SPUTUM  Final   Special Requests NONE  Final   Sputum evaluation   Final    Sputum specimen not acceptable for testing.  Please recollect.   Performed at [facility], [place] [address]  [place], [place] [address]    Report Status 10/30/2021 FINAL  Final     Labs: BNP (last 3 results) Recent Labs    10/06/21 0338 10/29/21 1744  BNP 32.5 502.7*   Basic Metabolic Panel: Recent Labs  Lab 10/29/21 1744 10/30/21 0234 10/31/21 0240 11/01/21 0241 11/02/21 0352  NA 135 139 135 139 137  K 4.5 4.2 4.0 4.3 4.5  CL 107 111 107 110 110  CO2 17* 19* 18* 21* 18*  GLUCOSE 98 117* 139* 112* 114*  BUN 21* _0 CREATININE 1.08 0.88 0.98 1.04 0.92  CALCIUM 10.1 10.0 9.7 9.4 9.3  MG  --   --  1.6* 2.1 1.9   Liver Function Tests: Recent Labs  Lab 10/29/21 1744 10/30/21 0234 11/01/21 0241  AST 97* 73* 39  ALT 116* 101* 67*  ALKPHOS 310* 279* 232*  BILITOT 0.6 0.5 0.3  PROT 8.4* 7.5 7.7  ALBUMIN 3.4* 3.1* 3.1*   No results for input(s): LIPASE, AMYLASE in the last 168 hours. No results for input(s): AMMONIA in the last 168 hours. CBC: Recent Labs  Lab 10/29/21 1744 10/30/21 0234 10/31/21 0240 11/01/21 0241 11/02/21 0352  WBC 7.2 6.5 5.5 4.9 4.7  NEUTROABS 4.8  --   --   --   --   HGB 11.4* 9.9* 10.0* 9.8* 10.2*  HCT 35.5* 31.4* 30.5* 30.9* 32.4*  MCV 88.5 88.7 86.6 88.3 91.0  PLT 359 310 286 310 354   Cardiac Enzymes: No results for input(s): CKTOTAL, CKMB, CKMBINDEX, TROPONINI in the last 168 hours. BNP: Invalid input(s): POCBNP CBG: No results for input(s): GLUCAP in the last 168 hours. D-Dimer No results for input(s): DDIMER in the last 72 hours. Hgb A1c No results for input(s): HGBA1C in the last 72 hours. Lipid Profile No results for input(s): CHOL, HDL, LDLCALC, TRIG, CHOLHDL, LDLDIRECT in the last 72 hours. Thyroid function studies No results for input(s): TSH, T4TOTAL, T3FREE, THYROIDAB in the last 72 hours.  Invalid input(s): FREET3 Anemia work up No results for input(s): VITAMINB12, FOLATE, FERRITIN, TIBC, IRON, RETICCTPCT in the last 72 hours. Urinalysis    Component Value Date/Time   COLORURINE YELLOW 10/29/2021 1758    APPEARANCEUR CLEAR 10/29/2021 1758   LABSPEC 1.021 10/29/2021 1758   PHURINE 5.0 10/29/2021 1758   GLUCOSEU NEGATIVE 10/29/2021 1758   HGBUR NEGATIVE 10/29/2021 1758   BILIRUBINUR NEGATIVE 10/29/2021 1758   KETONESUR NEGATIVE 10/29/2021 1758   PROTEINUR NEGATIVE 10/29/2021 1758   UROBILINOGEN 1.0 03/20/2015 0000   NITRITE NEGATIVE 10/29/2021 1758   LEUKOCYTESUR NEGATIVE 10/29/2021 1758   Sepsis Labs Invalid input(s): PROCALCITONIN,  WBC,  LACTICIDVEN Microbiology Recent Results (from the past 240 hour(s))  Blood Culture (routine x 2)     Status: Abnormal   Collection Time: 10/29/21  5:44 PM   Specimen: BLOOD  Result Value Ref Range Status   Specimen Description   Final    BLOOD BLOOD LEFT HAND Performed at [facility], [place] [address], [place], [place] [address]    Special Requests   Final  BOTTLES DRAWN AEROBIC AND ANAEROBIC Blood Culture adequate volume Performed at [facility] [address], [place], [place] [address]    Culture  Setup Time   Final    GRAM POSITIVE COCCI IN CLUSTERS AEROBIC BOTTLE ONLY CRITICAL RESULT CALLED TO, READ BACK BY AND VERIFIED WITH: PHARMD J.LEGGE AT 1952 ON 10/30/2021 BY T.SAAD.    Culture (A)  Final    STAPHYLOCOCCUS EPIDERMIDIS THE SIGNIFICANCE OF ISOLATING THIS ORGANISM FROM A SINGLE SET OF BLOOD CULTURES WHEN MULTIPLE SETS ARE DRAWN IS UNCERTAIN. PLEASE NOTIFY THE MICROBIOLOGY DEPARTMENT WITHIN ONE WEEK IF SPECIATION AND SENSITIVITIES ARE REQUIRED. Performed at [facility], [place] [address], [place], [place] [address]    Report Status 10/31/2021 FINAL  Final  Blood Culture ID Panel (Reflexed)     Status: Abnormal   Collection Time: 10/29/21  5:44 PM  Result Value Ref Range Status   Enterococcus faecalis NOT DETECTED NOT DETECTED Final   Enterococcus Faecium NOT DETECTED NOT DETECTED Final   Listeria monocytogenes NOT DETECTED NOT DETECTED Final   Staphylococcus species DETECTED (A) NOT  DETECTED Final    Comment: CRITICAL RESULT CALLED TO, READ BACK BY AND VERIFIED WITH: PHARMD J.LEGGE AT 1952 ON 10/30/2021 BY T.SAAD.    Staphylococcus aureus (BCID) NOT DETECTED NOT DETECTED Final   Staphylococcus epidermidis DETECTED (A) NOT DETECTED Final    Comment: Methicillin (oxacillin) resistant coagulase negative staphylococcus. Possible blood culture contaminant (unless isolated from more than one blood culture draw or clinical case suggests pathogenicity). No antibiotic treatment is indicated for blood  culture contaminants. CRITICAL RESULT CALLED TO, READ BACK BY AND VERIFIED WITH: PHARMD J.LEGGE AT 1952 ON 10/30/2021 BY T.SAAD.    Staphylococcus lugdunensis NOT DETECTED NOT DETECTED Final   Streptococcus species NOT DETECTED NOT DETECTED Final   Streptococcus agalactiae NOT DETECTED NOT DETECTED Final   Streptococcus pneumoniae NOT DETECTED NOT DETECTED Final   Streptococcus pyogenes NOT DETECTED NOT DETECTED Final   A.calcoaceticus-baumannii NOT DETECTED NOT DETECTED Final   Bacteroides fragilis NOT DETECTED NOT DETECTED Final   Enterobacterales NOT DETECTED NOT DETECTED Final   Enterobacter cloacae complex NOT DETECTED NOT DETECTED Final   Escherichia coli NOT DETECTED NOT DETECTED Final   Klebsiella aerogenes NOT DETECTED NOT DETECTED Final   Klebsiella oxytoca NOT DETECTED NOT DETECTED Final   Klebsiella pneumoniae NOT DETECTED NOT DETECTED Final   Proteus species NOT DETECTED NOT DETECTED Final   Salmonella species NOT DETECTED NOT DETECTED Final   Serratia marcescens NOT DETECTED NOT DETECTED Final   Haemophilus influenzae NOT DETECTED NOT DETECTED Final   Neisseria meningitidis NOT DETECTED NOT DETECTED Final   Pseudomonas aeruginosa NOT DETECTED NOT DETECTED Final   Stenotrophomonas maltophilia NOT DETECTED NOT DETECTED Final   Candida albicans NOT DETECTED NOT DETECTED Final   Candida auris NOT DETECTED NOT DETECTED Final   Candida glabrata NOT DETECTED NOT  DETECTED Final   Candida krusei NOT DETECTED NOT DETECTED Final   Candida parapsilosis NOT DETECTED NOT DETECTED Final   Candida tropicalis NOT DETECTED NOT DETECTED Final   Cryptococcus neoformans/gattii NOT DETECTED NOT DETECTED Final   Methicillin resistance mecA/C DETECTED (A) NOT DETECTED Final    Comment: CRITICAL RESULT CALLED TO, READ BACK BY AND VERIFIED WITH: PHARMD J.LEGGE AT 1952 ON 10/30/2021 BY T.SAAD. Performed at [facility], [place] [address] [place], [place] [address]   Urine Culture     Status: None   Collection Time: 10/29/21  5:59 PM   Specimen:  In/Out Cath Urine  Result Value Ref Range Status   Specimen Description   Final    IN/OUT CATH URINE Performed at [facility], [place] [address] [place], [place] [address]    Special Requests   Final    NONE Performed at [facility], [place] [address], [place], [place] [address]    Culture   Final    NO GROWTH Performed at [facility], [place] [address], [place], [place] [address]    Report Status 10/31/2021 FINAL  Final  Resp Panel by RT-PCR (Flu A&B, Covid)     Status: None   Collection Time: 10/29/21  6:24 PM  Result Value Ref Range Status   SARS Coronavirus 2 by RT PCR NEGATIVE NEGATIVE Final    Comment: (NOTE) SARS-CoV-2 target nucleic acids are NOT DETECTED.  The SARS-CoV-2 RNA is generally detectable in upper respiratory specimens during the acute phase of infection. The lowest concentration of SARS-CoV-2 viral copies this assay can detect is 138 copies/mL. A negative result does not preclude SARS-Cov-2 infection and should not be used as the sole basis for treatment or other patient management decisions. A negative result may occur with  improper specimen collection/handling, submission of specimen other than nasopharyngeal swab, presence of viral mutation(s) within the areas targeted by this assay, and inadequate number of viral copies(<138 copies/mL). A  negative result must be combined with clinical observations, patient history, and epidemiological information. The expected result is Negative.  Fact Sheet for Patients:  EntrepreneurPulse.com.au  Fact Sheet for Healthcare Providers:  [employer]  This test is no t yet approved or cleared by the [place] FDA and  has been authorized for detection and/or diagnosis of SARS-CoV-2 by FDA under an Emergency Use Authorization (EUA). This EUA will remain  in effect (meaning this test can be used) for the duration of the COVID-19 declaration under Section 564(b)(1) of the Act, 21 U.S.C.section 360bbb-3(b)(1), unless the authorization is terminated  or revoked sooner.       Influenza A by PCR NEGATIVE NEGATIVE Final   Influenza B by PCR NEGATIVE NEGATIVE Final    Comment: (NOTE) The Xpert Xpress SARS-CoV-2/FLU/RSV plus assay is intended as an aid in the diagnosis of influenza from Nasopharyngeal swab specimens and should not be used as a sole basis for treatment. Nasal washings and aspirates are unacceptable for Xpert Xpress SARS-CoV-2/FLU/RSV testing.  Fact Sheet for Patients: EntrepreneurPulse.com.au  Fact Sheet for Healthcare Providers: IncredibleEmployment[employer]be  This test is not yet approved or cleared by the [place] FDA and has been authorized for detection and/or diagnosis of SARS-CoV-2 by FDA under an Emergency Use Authorization (EUA). This EUA will remain in effect (meaning this test can be used) for the duration of the COVID-19 declaration under Section 564(b)(1) of the Act, 21 U.S.C. section 360bbb-3(b)(1), unless the authorization is terminated or revoked.  Performed at [facility], [place] [address], [place], [place] [address]   Blood Culture (routine x 2)     Status: None (Preliminary result)   Collection Time: 10/29/21  7:27 PM   Specimen: BLOOD  Result  Value Ref Range Status   Specimen Description   Final    BLOOD RIGHT ANTECUBITAL Performed at [facility] [address] [place], [place] [address]    Special Requests   Final    BOTTLES DRAWN AEROBIC AND ANAEROBIC Blood Culture adequate volume Performed at [facility] [address], [place],  [address]  Culture   Final    NO GROWTH 3 DAYS Performed at [facility], [place] [address], [place], [place] [address]    Report Status PENDING  Incomplete  MRSA Next Gen by PCR, Nasal     Status: None   Collection Time: 10/29/21 11:14 PM   Specimen: Nasal Mucosa; Nasal Swab  Result Value Ref Range Status   MRSA by PCR Next Gen NOT DETECTED NOT DETECTED Final    Comment: (NOTE) The GeneXpert MRSA Assay (FDA approved for NASAL specimens only), is one component of a comprehensive MRSA colonization surveillance program. It is not intended to diagnose MRSA infection nor to guide or monitor treatment for MRSA infections. Test performance is not FDA approved in patients less than 61 years old. Performed at [facility], [place] [address] [place], [place] [address]   Expectorated Sputum Assessment w Gram Stain, Rflx to Resp Cult     Status: None   Collection Time: 10/30/21 12:34 PM   Specimen: Expectorated Sputum  Result Value Ref Range Status   Specimen Description EXPECTORATED SPUTUM  Final   Special Requests NONE  Final   Sputum evaluation   Final    Sputum specimen not acceptable for testing.  Please recollect.   Performed at [facility], [place] [address], [place], [place] [address]    Report Status 10/30/2021 FINAL  Final     Time coordinating discharge:  I have spent 35 minutes face to face with the patient and on the ward discussing the patients care, assessment, plan and disposition with other care givers. >50% of the time was devoted counseling the patient about the risks and benefits of  treatment/Discharge disposition and coordinating care.   SIGNED:   Damita Lack, MD  [facility] 11/02/2021, 10:13 AM   If 7PM-7AM, please contact night-coverage

## 2021-11-02 NOTE — Plan of Care (Signed)

## 2021-11-02 NOTE — TOC Transition Note (Signed)
Transition of Care [facility]) - CM/SW Discharge Note   Patient Details  Name: Kurt Foley MRN: [id number] Date of Birth: [birth year]  [facility] CM/SW Contact:  Golda Acre, RN Phone Number: 11/02/2021, 8:59 AM   Clinical Narrative:    Patient discharged to home with no toc needs.   Final next level of care: Home/Self Care Barriers to Discharge: No Barriers Identified   Patient Goals and CMS Choice Patient states their goals for this hospitalization and ongoing recovery are:: to go home CMS Medicare.gov Compare Post Acute Care list provided to:: Patient    Discharge Placement                       Discharge Plan and Services   Discharge Planning Services: CM Consult                                 Social Determinants of Health (SDOH) Interventions     Readmission Risk Interventions    10/31/2021    9:45 AM  Readmission Risk Prevention Plan  Transportation Screening Complete  Medication Review (RN Care Manager) Complete  PCP or Specialist appointment within 3-5 days of discharge Complete  HRI or Home Care Consult Complete  SW Recovery Care/Counseling Consult Complete  Palliative Care Screening Not Applicable  Skilled Nursing Facility Not Applicable

## 2021-11-02 NOTE — Plan of Care (Signed)
  Problem: Education: Goal: Knowledge of General Education information will improve Description: Including pain rating scale, medication(s)/side effects and non-pharmacologic comfort measures 11/02/2021 1525 by Charmian Muff, RN Outcome: Adequate for Discharge 11/02/2021 1358 by Charmian Muff, RN Outcome: Progressing   Problem: Health Behavior/Discharge Planning: Goal: Ability to manage health-related needs will improve 11/02/2021 1525 by Charmian Muff, RN Outcome: Adequate for Discharge 11/02/2021 1358 by Charmian Muff, RN Outcome: Progressing   Problem: Clinical Measurements: Goal: Ability to maintain clinical measurements within normal limits will improve 11/02/2021 1525 by Charmian Muff, RN Outcome: Adequate for Discharge 11/02/2021 1358 by Charmian Muff, RN Outcome: Progressing Goal: Will remain free from infection Outcome: Adequate for Discharge Goal: Diagnostic test results will improve Outcome: Adequate for Discharge Goal: Respiratory complications will improve Outcome: Adequate for Discharge Goal: Cardiovascular complication will be avoided Outcome: Adequate for Discharge   Problem: Activity: Goal: Risk for activity intolerance will decrease Outcome: Adequate for Discharge   Problem: Nutrition: Goal: Adequate nutrition will be maintained Outcome: Adequate for Discharge   Problem: Coping: Goal: Level of anxiety will decrease Outcome: Adequate for Discharge   Problem: Elimination: Goal: Will not experience complications related to bowel motility Outcome: Adequate for Discharge Goal: Will not experience complications related to urinary retention Outcome: Adequate for Discharge   Problem: Pain Managment: Goal: General experience of comfort will improve Outcome: Adequate for Discharge   Problem: Safety: Goal: Ability to remain free from injury will improve Outcome: Adequate for Discharge   Problem: Skin Integrity: Goal: Risk for impaired  skin integrity will decrease Outcome: Adequate for Discharge   Problem: Acute Rehab PT Goals(only PT should resolve) Goal: Pt Will Go Supine/Side To Sit Outcome: Adequate for Discharge Goal: Pt Will Go Sit To Supine/Side Outcome: Adequate for Discharge Goal: Patient Will Transfer Sit To/From Stand Outcome: Adequate for Discharge Goal: Pt Will Ambulate Outcome: Adequate for Discharge

## 2021-11-02 NOTE — Evaluation (Addendum)
Occupational Therapy Evaluation Patient Details Name: Kurt Foley MRN: [id number] DOB: 08-27-1975 Today's Date: 11/02/2021   History of Present Illness 46 yo male admitted with PE, R LE DVT. 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   Clinical Impression   Patient is a 46 year old male who was noted to have increased need for assistance with donning bilateral shoes and R AFO on this date. Patient was noted to have decreased functional activity tolerance and decreased time wearing resting hand splint for RUE. Patient reported living at home with 24/7 family support. Patient plans to transition home today with family support. Patient would benefit from continued acute skilled OT services to increase independence in ADLs and safety in next level of care.       Recommendations for follow up therapy are one component of a multi-disciplinary discharge planning process, led by the attending physician.  Recommendations may be updated based on patient status, additional functional criteria and insurance authorization.   Follow Up Recommendations  No OT follow up    Assistance Recommended at Discharge    Patient can return home with the following A little help with bathing/dressing/bathroom;Assistance with cooking/housework;Direct supervision/assist for financial management;Assist for transportation;Help with stairs or ramp for entrance;Direct supervision/assist for medications management    Functional Status Assessment  Patient has had a recent decline in their functional status and demonstrates the ability to make significant improvements in function in a reasonable and predictable amount of time.  Equipment Recommendations  None recommended by OT    Recommendations for Other Services       Precautions / Restrictions Precautions Precautions: Fall Precaution Comments: R residual weakness UE/LE; R foot AFO Restrictions Weight  Bearing Restrictions: No      Mobility Bed Mobility Overal bed mobility: Needs Assistance Bed Mobility: Supine to Sit, Sit to Supine     Supine to sit: Modified independent (Device/Increase time) Sit to supine: Modified independent (Device/Increase time)          Balance Overall balance assessment: Mild deficits observed, not formally tested               ADL either performed or assessed with clinical judgement   ADL Overall ADL's : Needs assistance/impaired Eating/Feeding: Set up;Sitting   Grooming: Set up;Sitting   Upper Body Bathing: Moderate assistance;Sitting   Lower Body Bathing: Moderate assistance;Sit to/from stand;Sitting/lateral leans   Upper Body Dressing : Moderate assistance;Sitting   Lower Body Dressing: Sit to/from stand;Moderate assistance   Toilet Transfer: Hydrographic surveyor Details (indicate cue type and reason): hemi walker and AFO and shoes in place. with increased time. Toileting- Architect and Hygiene: Min guard;Sit to/from stand;Sitting/lateral lean Toileting - Clothing Manipulation Details (indicate cue type and reason): simulated     Functional mobility during ADLs: Min guard General ADL Comments: with hemi walker in the hallway with good self monitoring. patient noted to have HR increase to 115-117 bpm with functional moblity. patient reported this is typical for him patient was educated on importance of wearing resting hand splint at home to prevent contracture and promote good positioning of RUE. Patient verbalized understanding.      Vision Patient Visual Report: No change from baseline       Perception     Praxis      Pertinent Vitals/Pain Pain Assessment Pain Assessment: No/denies pain     Hand Dominance Right   Extremity/Trunk Assessment Upper Extremity Assessment Upper Extremity Assessment:  RUE deficits/detail RUE Deficits / Details: no functional use of R UE; R UE contracted, has resting hand  splint at home that patient reported he has not worn in a while. re educated on importance of splint   Lower Extremity Assessment Lower Extremity Assessment: Defer to PT evaluation   Cervical / Trunk Assessment Cervical / Trunk Assessment: Normal   Communication Communication Communication: No difficulties   Cognition Arousal/Alertness: Awake/alert Behavior During Therapy: WFL for tasks assessed/performed Overall Cognitive Status: Within Functional Limits for tasks assessed                         Home Living Family/patient expects to be discharged to:: Private residence Living Arrangements: Other relatives Available Help at Discharge: Family;Available 24 hours/day Type of Home: House Home Access: Stairs to enter [employer] of Steps: curb Entrance Stairs-Rails: Right Home Layout: One level     Bathroom Shower/Tub: Chief Strategy Officer: Standard Bathroom Accessibility: No   Home Equipment: Agricultural consultant (2 wheels);Cane - single point;Shower seat   Additional Comments: Hemi-walker; AFO, resting hand splint R hand      Prior Functioning/Environment Prior Level of Function : Needs assist       Physical Assist : ADLs (physical)   ADLs (physical): Bathing;Dressing;Toileting Mobility Comments: Uses hemi-walker - walks in house, to car; limited community ADLs Comments: Pt has help with ADLs - dressing and bathing        OT Problem List: Decreased activity tolerance;Impaired balance (sitting and/or standing);Decreased safety awareness;Decreased knowledge of precautions;Decreased knowledge of use of DME or AE;Impaired UE functional use      OT Treatment/Interventions: Self-care/ADL training;Therapeutic exercise;Neuromuscular education;Energy conservation;DME and/or AE instruction;Therapeutic activities;Balance training;Patient/family education    OT Goals(Current goals can be found in the care plan section) Acute Rehab OT Goals Patient  Stated Goal: to go home today OT Goal Formulation: With patient Time For Goal Achievement: 11/16/21 Potential to Achieve Goals: Good  OT Frequency: Min 1X/week    AM-PAC OT "6 Clicks" Daily Activity     Outcome Measure Help from another person eating meals?: A Little Help from another person taking care of personal grooming?: A Little Help from another person toileting, which includes using toliet, bedpan, or urinal?: A Little Help from another person bathing (including washing, rinsing, drying)?: A Lot Help from another person to put on and taking off regular upper body clothing?: A Lot Help from another person to put on and taking off regular lower body clothing?: A Lot 6 Click Score: 15   End of Session Equipment Utilized During Treatment: Gait belt  Activity Tolerance: Patient tolerated treatment well Patient left: in bed;with call bell/phone within reach  OT Visit Diagnosis: Unsteadiness on feet (R26.81);Other abnormalities of gait and mobility (R26.89)                Time: 2703-5009 OT Time Calculation (min): 37 min Charges:  OT General Charges $OT Visit: 1 Visit OT Evaluation $OT Eval Low Complexity: 1 Low OT Treatments $Self Care/Home Management : 8-22 mins  Sharyn Blitz OTR/L, MS Acute Rehabilitation Department Office# [phone] Pager# [phone]   Ardyth Harps 11/02/2021, 10:25 AM

## 2021-11-03 ENCOUNTER — Telehealth: Payer: Self-pay

## 2021-11-03 NOTE — Telephone Encounter (Signed)
Transition Care Management Unsuccessful Follow-up Telephone Call  Date of discharge and from where:  11/02/2021, [facility]  Attempts:  1st Attempt  Reason for unsuccessful TCM follow-up call:  Left voice message on # [phone], call back requested.  Patient has a follow up appointment with Geryl Rankins, NP at [facility] - 11/18/2021

## 2021-11-04 ENCOUNTER — Ambulatory Visit: Payer: Medicaid Other | Admitting: Physical Therapy

## 2021-11-04 ENCOUNTER — Encounter: Payer: Self-pay | Admitting: Physical Therapy

## 2021-11-04 ENCOUNTER — Ambulatory Visit: Payer: Medicaid Other | Admitting: Occupational Therapy

## 2021-11-04 ENCOUNTER — Encounter: Payer: Self-pay | Admitting: Occupational Therapy

## 2021-11-04 ENCOUNTER — Telehealth: Payer: Self-pay

## 2021-11-04 DIAGNOSIS — I69351 Hemiplegia and hemiparesis following cerebral infarction affecting right dominant side: Secondary | ICD-10-CM | POA: Diagnosis present

## 2021-11-04 DIAGNOSIS — M6281 Muscle weakness (generalized): Secondary | ICD-10-CM | POA: Diagnosis present

## 2021-11-04 DIAGNOSIS — R2689 Other abnormalities of gait and mobility: Secondary | ICD-10-CM | POA: Diagnosis present

## 2021-11-04 DIAGNOSIS — R2681 Unsteadiness on feet: Secondary | ICD-10-CM

## 2021-11-04 DIAGNOSIS — R208 Other disturbances of skin sensation: Secondary | ICD-10-CM

## 2021-11-04 DIAGNOSIS — R41840 Attention and concentration deficit: Secondary | ICD-10-CM

## 2021-11-04 DIAGNOSIS — R278 Other lack of coordination: Secondary | ICD-10-CM | POA: Diagnosis present

## 2021-11-04 LAB — CULTURE, BLOOD (ROUTINE X 2)
Culture: NO GROWTH
Special Requests: ADEQUATE

## 2021-11-04 NOTE — Therapy (Signed)
OUTPATIENT OCCUPATIONAL THERAPY NEURO EVALUATION  Patient Name: Kurt Foley MRN: [id number] DOB:01/08/1976, 46 y.o., male Today's Date: 11/04/2021  PCP: Gildardo Pounds, NP  REFERRING PROVIDER: Gildardo Pounds, NP    OT End of Session - 11/04/21 [phone]     Visit Number 1    Number of Visits 9    Date for OT Re-Evaluation 12/30/21    Authorization Type [place] [employer]    Authorization - Visit Number 3    Authorization - Number of Visits 27   max combined per year   OT Start Time 0830    OT Stop Time 0915    OT Time Calculation (min) 45 min    Behavior During Therapy [facility] for tasks assessed/performed            Past Medical History:  Diagnosis Date   Depression    HIV infection ([place])    History reviewed. No pertinent surgical history. Patient Active Problem List   Diagnosis Date Noted   Pulmonary embolism ([place]) 10/29/2021   Tinea corporis and facialis 10/07/2021   Acute respiratory infection    Tinea barbae    Dyspnea 10/06/2021   Mass of upper lobe of right lung 10/06/2021   Cough 09/14/2021   Insomnia 08/08/2021   Pancytopenia ([place]) 08/08/2021   Abnormal LFTs 08/08/2021   Neutropenia ([place]) 08/08/2021   Sinus tachycardia    Encephalitis 07/22/2021   Debility 07/20/2021   Hypokalemia 07/19/2021   Hyponatremia 07/18/2021   Malnutrition of moderate degree 07/15/2021   FTT (failure to thrive) in adult 07/13/2021   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[place]) 07/08/2021   CVA (cerebral vascular accident) ([place]) 07/08/2021   History of CVA with residual right hemiparesis 05/25/2021   Rash of mouth present on examination 03/25/2021   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[place]) 03/25/2021   Need for prophylactic vaccination and inoculation against smallpox 03/25/2021   Numbness and tingling of right arm 03/24/2021   AIDS (acquired immune deficiency syndrome) ([place]) 03/05/2021   Emphysema, unspecified ([place]) 03/05/2021    Cocaine-induced mood disorder ([place])    Tinea pedis 08/06/2019   At risk for opportunistic infections 07/14/2019   AKI (acute kidney injury) ([place]) 06/02/2019   Generalized weakness 06/02/2019   Cocaine abuse ([place]) 11/25/2018   Major depressive disorder, recurrent severe without psychotic features ([place]) 11/25/2018   Substance induced mood disorder ([place]) 09/16/2015   Cocaine use disorder, severe, dependence ([place])    Cannabis use disorder, severe, dependence ([place]) 04/09/2015   Severe recurrent major depression without psychotic features ([place]) 04/08/2015   Alcohol abuse 04/08/2015   Screening examination for venereal disease 03/25/2015   Encounter for long-term (current) use of medications 03/25/2015   Tobacco abuse    Visual disturbance 11/18/2013   Human immunodeficiency virus (HIV) disease ([place]) 10/30/2013   Atopic dermatitis 10/30/2013    ONSET DATE: 10/18/21 (referral date)  REFERRING DIAG: I63.9 (ICD-10-CM) - Cerebrovascular accident (CVA), unspecified mechanism ([place])   THERAPY DIAG:  Hemiplegia and hemiparesis following cerebral infarction affecting right dominant side ([place])  Attention and concentration deficit  Muscle weakness (generalized)  Other lack of coordination  Other disturbances of skin sensation  Other abnormalities of gait and mobility  Rationale for Evaluation and Treatment Rehabilitation  SUBJECTIVE:   SUBJECTIVE STATEMENT: Pt arrives to OP OT evaluation w/ primary concern of R-sided weakness and limitations w/ functional use of R, dominant UE. Pt reports his CVA was "8 months ago, probably longer," but OT unable to confirm chronicity of CVA via  chart review. Pt reports he requires assist w/ all BADLs and does not participate in many IADLs. Pt accompanied by: self and family member (mom)  PERTINENT HISTORY: CVA w/ residual R-sided weakness, HIV/AIDS, PML (progressive multifocal leukoencephalopathy, HTN, HLD, MAI infection, disseminated MAC, dyspnea, mass of  upper lobe of R lung, h/o polysubstance use, and recent hospitalization for PE (10/29/21)  PRECAUTIONS: Fall  WEIGHT BEARING RESTRICTIONS No  PAIN:  Are you having pain? No  FALLS: Has patient fallen in last 6 months? No  LIVING ENVIRONMENT: Lives with: lives with their family (between aunt and grandmother) Lives in: House/apartment Stairs:  grandmother's house is single-level; aunt's house is multi-level Has following equipment at home: Hemi walker  PLOF: Requires assistive device for independence and Needs assistance with ADLs  PATIENT GOALS "I want to work on use of my R hand and my R leg"  OBJECTIVE:   HAND DOMINANCE: Right  ADLs: Overall ADLs: Mod A Transfers/ambulation related to ADLs: requires AD for in-home mobility Eating: Needs assist cutting food Grooming: Mod I UB Dressing: Mod A LB Dressing: Mod A Toileting: Mod I Bathing: Mod A Tub Shower transfers: Mod A Equipment: Shower seat without back   IADLs: Shopping: Relies on family for shopping trips Light housekeeping: Not currently participating in housekeeping tasks Meal Prep: Able to complete simple snack prep Community mobility: Not driving Medication management: Independent; uses a pillbox Handwriting: unable  MOBILITY STATUS: Needs Assist: Uses hemi-walker  ACTIVITY TOLERANCE: Reports mild decreased activity tolerance and endurance since recent hospitalization  FUNCTIONAL OUTCOME MEASURES: Upper Extremity Functional Scale (UEFS): To be assessed Barthel Index: 80/100  UPPER EXTREMITY MMT:     MMT Right eval  Shoulder flexion 1/5  Shoulder abduction 0/5  Shoulder adduction 3+/5  Shoulder extension 2+/5  Shoulder internal rotation 3-/5  Shoulder external rotation 0/5  Middle trapezius 2+/5  Elbow flexion 2+/5  Elbow extension 2-/5  Wrist flexion 0/5  Wrist extension   Wrist ulnar deviation   Wrist radial deviation   Wrist pronation   Wrist supination   (Blank rows = not  tested)  HAND FUNCTION: No R hand gross grasp/release at this time  COORDINATION: No active hand finger flexion/extension observed  SENSATION: Light touch: Impaired  Hot/Cold: Impaired  Proprioception: Impaired   MUSCLE TONE: RUE: Hypertonic; particularly Elbow, wrist, and hand  COGNITION: Overall cognitive status: Difficulty to assess and No family/caregiver present to determine baseline cognitive functioning  VISION: Subjective report: Reports blurred vision Baseline vision:  No consistent annual eye exams Visual history:  None reported  VISION ASSESSMENT: Tracking/Visual pursuits: Able to track stimulus in all quads without difficulty Saccades: impaired; difficulty w/ achieving R gaze  Patient has difficulty with following activities due to following visual impairments: Reading  PERCEPTION: Impaired: Inattention/neglect: Impaired- to be further tested in functional concext  TODAY'S TREATMENT:  None   PATIENT EDUCATION: Education details: Education provided on role and purpose of OT, as well as potential interventions and goals for therapy based on initial evaluation findings.  Person educated: Patient Education method: Explanation Education comprehension: verbalized understanding   HOME EXERCISE PROGRAM: Access Code: [email or web address] - Seated Shoulder Shrug  - 3 x daily - 2 sets - 10 reps - Seated Shoulder Flexion AAROM with Hemiparetic UE  - 3 x daily - 2 sets - 10 reps - Seated AAROM Wrist Flexion/Extension with Clasped Hands  - 3 x daily - 2 sets - 5 reps - 5 hold   GOALS: Goals reviewed with patient? Yes  SHORT TERM GOALS: Target date: 12/02/21  STG  Status:  1 Pt will demonstrate independence w/ HEP designed for NMR of RUE GMC Baseline: No HEP at this time INITIAL  2 Pt will demonstrate simulated tub/shower transfer w/ SPV to improve independence w/ bathing tasks Baseline: Mod A INITIAL     LONG TERM GOALS: Target date: 12/30/21  LTG  Status:  1 Pt will  demonstrate UB dressing w/ Mod I, incorporating compensatory strategies/AE prn by d/c Baseline: Mod A INITIAL  2 Pt will be able to don/doff bottoms w/ Min A, incorporating compensatory strategies/AE prn by d/c Baseline: Mod A INITIAL  3 Pt will increase R shoulder flexion to at least 2-/5 to improve participation in functional tasks Baseline: 1/5 INITIAL  4 Pt will demonstrate gross grasp and release w/ R hand to improve participation in functional tasks Baseline: No gross activation of R hand/fingers INITIAL     ASSESSMENT:  CLINICAL IMPRESSION: Pt is a 46 y/o male who presents to OP OT due to R-sided hemiparesis s/p CVA of unknown chronicity. PMH includes HIV/AIDS, PML (progressive multifocal leukoencephalopathy), HTN, HLD, MAI infection, disseminated MAC, dyspnea, mass of upper lobe of R lung, h/o polysubstance use, and recent hospitalization for PE (10/29/21). Pt currently lives with family and is not working at this time. Pt will benefit from skilled occupational therapy services to address strength and coordination, ROM, altered sensation, balance, GM/FM control, cognition, safety awareness, introduction of compensatory strategies/AE prn, visual-perception, and implementation of an HEP to improve participation and safety during ADLs.   PERFORMANCE DEFICITS in functional skills including ADLs, IADLs, coordination, dexterity, proprioception, sensation, tone, ROM, strength, muscle spasms, FMC, GMC, mobility, balance, body mechanics, endurance, decreased knowledge of use of DME, vision, and UE functional use, cognitive skills including attention, problem solving, and safety awareness, and psychosocial skills including environmental adaptation.   IMPAIRMENTS are limiting patient from ADLs, IADLs, work, leisure, and social participation.   COMORBIDITIES has co-morbidities such as PML, dyspnea, h/o PE and recurrent infections  that affects occupational performance. Patient will benefit from skilled  OT to address above impairments and improve overall function.  MODIFICATION OR ASSISTANCE TO COMPLETE EVALUATION: Min-Moderate modification of tasks or assist with assess necessary to complete an evaluation.  OT OCCUPATIONAL PROFILE AND HISTORY: Detailed assessment: Review of records and additional review of physical, cognitive, psychosocial history related to current functional performance.  CLINICAL DECISION MAKING: High - multiple treatment options, significant modification of task necessary  REHAB POTENTIAL: Fair - chronicity of CVA unclear at this time; insurance-based visit limitations  EVALUATION COMPLEXITY: Moderate   PLAN: OT FREQUENCY: 2x/week (to decrease to 1x/week after initial 2 weeks considering insurance-based visit limitations)  OT DURATION: 8 weeks  PLANNED INTERVENTIONS: self care/ADL training, therapeutic exercise, therapeutic activity, neuromuscular re-education, manual therapy, passive range of motion, functional mobility training, aquatic therapy, splinting, electrical stimulation, moist heat, cryotherapy, patient/family education, cognitive remediation/compensation, visual/perceptual remediation/compensation, energy conservation, and DME and/or AE instructions  RECOMMENDED OTHER SERVICES: Currently receiving PT services at this location  CONSULTED AND AGREED WITH PLAN OF CARE: Patient  PLAN FOR NEXT SESSION: NMR of [place], MSOT, OTR/L 11/04/2021, 11:43 AM

## 2021-11-04 NOTE — Telephone Encounter (Signed)
Transition Care Management Follow-up Telephone Call Date of discharge and from where: 11/02/2021, [facility]  How have you been since you were released from the hospital? He stated that he is feeling better.  Any questions or concerns? No Items Reviewed: Did the pt receive and understand the discharge instructions provided? Yes   Medications obtained and verified?  He said he has all of his medications . No question verbalized  Other? No  Any new allergies since your discharge? No  Dietary orders reviewed? No Do you have support at home? Yes  - family   Home Care and Equipment/Supplies: Were home health services ordered? no If so, what is the name of the agency? N/a  Has the agency set up a time to come to the patient's home? not applicable Were any new equipment or medical supplies ordered?  No What is the name of the medical supply agency? N/a Were you able to get the supplies/equipment? not applicable Do you have any questions related to the use of the equipment or supplies? No   He has been referred to outpatient P.    Functional Questionnaire: (I = Independent and D = Dependent) ADLs: he uses a walker with ambulation.  His cousin assists with ADLs as needed.      Follow up appointments reviewed:   PCP Hospital f/u appt confirmed?  Scheduled to see Bertram Denver, NP -11/18/2021.  Specialist Hospital f/u appt confirmed? Yes   Are transportation arrangements needed? No  If their condition worsens, is the pt aware to call PCP or go to the Emergency Dept.? Yes Was the patient provided with contact information for the PCP's office or ED? Yes Was to pt encouraged to call back with questions or concerns? Yes

## 2021-11-04 NOTE — Therapy (Signed)
OUTPATIENT PHYSICAL THERAPY ORTHO TREATMENT   Patient Name: Kurt Foley MRN: [id number] DOB:1976-05-31, 46 y.o., male Today's Date: 11/04/2021   PT End of Session - 11/04/21 1015     Visit Number 4    Number of Visits 16    Date for PT Re-Evaluation 12/13/21    Authorization Type [employer] MCD approved for [facility] and has disability    Authorization Time Period 10/18/21 to 12/13/21    Progress Note Due on Visit 10    PT Start Time 0931    PT Stop Time 1012    PT Time Calculation (min) 41 min    Activity Tolerance Patient tolerated treatment well    Behavior During Therapy [facility] for tasks assessed/performed                Past Medical History:  Diagnosis Date   Depression    HIV infection ([place])    History reviewed. No pertinent surgical history. Patient Active Problem List   Diagnosis Date Noted   Pulmonary embolism ([place]) 10/29/2021   Tinea corporis and facialis 10/07/2021   Acute respiratory infection    Tinea barbae    Dyspnea 10/06/2021   Mass of upper lobe of right lung 10/06/2021   Cough 09/14/2021   Insomnia 08/08/2021   Pancytopenia ([place]) 08/08/2021   Abnormal LFTs 08/08/2021   Neutropenia ([place]) 08/08/2021   Sinus tachycardia    Encephalitis 07/22/2021   Debility 07/20/2021   Hypokalemia 07/19/2021   Hyponatremia 07/18/2021   Malnutrition of moderate degree 07/15/2021   FTT (failure to thrive) in adult 07/13/2021   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[place]) 07/08/2021   CVA (cerebral vascular accident) ([place]) 07/08/2021   History of CVA with residual right hemiparesis 05/25/2021   Rash of mouth present on examination 03/25/2021   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[place]) 03/25/2021   Need for prophylactic vaccination and inoculation against smallpox 03/25/2021   Numbness and tingling of right arm 03/24/2021   AIDS (acquired immune deficiency syndrome) ([place]) 03/05/2021   Emphysema, unspecified ([place]) 03/05/2021    Cocaine-induced mood disorder ([place])    Tinea pedis 08/06/2019   At risk for opportunistic infections 07/14/2019   AKI (acute kidney injury) ([name]) 06/02/2019   Generalized weakness 06/02/2019   Cocaine abuse ([place]) 11/25/2018   Major depressive disorder, recurrent severe without psychotic features ([place]) 11/25/2018   Substance induced mood disorder ([place]) 09/16/2015   Cocaine use disorder, severe, dependence ([place])    Cannabis use disorder, severe, dependence ([place]) 04/09/2015   Severe recurrent major depression without psychotic features ([place]) 04/08/2015   Alcohol abuse 04/08/2015   Screening examination for venereal disease 03/25/2015   Encounter for long-term (current) use of medications 03/25/2015   Tobacco abuse    Visual disturbance 11/18/2013   Human immunodeficiency virus (HIV) disease ([place]) 10/30/2013   Atopic dermatitis 10/30/2013    PCP: Gildardo Pounds, NP REFERRING PROVIDER: Mercy Riding, MD  ONSET DATE: 07/08/21  REFERRING DIAG: R26.81 (ICD-10-CM) - Unsteady gait  THERAPY DIAG:  Muscle weakness (generalized)  Other abnormalities of gait and mobility  Unsteadiness on feet  SUBJECTIVE:  SUBJECTIVE STATEMENT:   I had too much excitement going on the past few days in the hospital. Had no symptoms of SOB when I was here with you last. I don't have any choice but to work on my leg too but I want to work on my arm.   Pt accompanied by: self  PERTINENT HISTORY: CVA (right hemiparesis); HIV, emphysema, CAD  PAIN:  Are you having pain? No  PRECAUTIONS: Other: HIV  WEIGHT BEARING RESTRICTIONS No  FALLS: Has patient fallen in last 6 months? No  LIVING ENVIRONMENT: Lives with: lives with their family Lives in: House/apartment Stairs: No Has following equipment at home: Quad cane  large base  PLOF: Independent  PATIENT GOALS He wants to get better use of his arm and leg.       TREATMENT 10/26/21  WC propulsion x70ft with MinA from PT to steer R side of chair  Bed mobility MinA sit to supine, MinA supine to sit               Transfers from chair to chair: min guard with hemiwalker                Car transfer with min guard assist no device                 Seated               Lateral trunk bends for WB onto hemi UE (ModA from therapist to control motion and for good alignment/safety) 1x10                                 Supine                Bridges 2x10                R LE adduction in hook laying 2x10                 Heel slides with pillow case on shoe to reduce friction 2x10                Marches in supine R LE 2x10                 Standing                Sit to stands with ModA from PT for weight shift over RLE 1x5     TREATMENT 5/19  Standing  Gait training from waiting room to gym (approximately 60-95ft x2 in and out of clinic) hemiwalker S to min guard step to pattern  Stepping forward over TB with lunge forward onto RLE- Min-ModA for control/coordination RLE  Stepping forward over TB with lunge forward L LE- MinA to control hyperextension RLE in stance   Foot taps on 4 inch box first with RLE for motor control 1x10, then with L LE practicing stance in R LE while avoiding knee hyperextension 1x10   Sitting   Nustep L4 6 minutes BLEs only use of band to keep knees in good alignment                   LAQs RLE 1x10 2# 1x10  Seated marches 1x10 2# RLE   TREATMENT 5/26  Nustep L4x6 minutes BLEs only use of band to keep LEs in good alignment (self selected pace today)  Step ups on 4 inch step U UE  support 1x10 B- Mod facilitation for good alignment when stepping up with R LE Double toe taps on 4 inch then 8 inch step 1x10 B- Mod facilitation for good alignment in stance RLE, also to avoid knee hyperextension in stance   Sitting LAQs  2# 1x15 R LE  Seated marches 2# 1x15 R LE  Tried up and overs of cone with R LE in sitting- unable today due to fatigue      PATIENT EDUCATION: Education details: exercise form and purpose, good technique with activities to make sure we are loading RLE well and in good alignment Person educated: patient  Education method: explanation  Education comprehension:agreeable    HOME EXERCISE PROGRAM: Access Code: [email or web address] - Sit to Stand with Armchair  - 2-3 x daily - 7 x weekly - 1-2 sets - 5 reps  Patient Education - walking program     GOALS: Goals reviewed with patient? Yes  SHORT TERM GOALS: Target date: 11/01/2021  Ind with initial HEP Baseline:No HEP Goal status: INITIAL  2.  Patient compliant with daily walking program Baseline: no walking program Goal status: INITIAL   LONG TERM GOALS: Target date: 12/13/2021  Ind with advanced HEP Baseline: No HEP Goal status: INITIAL  2.  Improved 5x sit to stand to <= 11 sec demonstrating increased strength and decreased risk for falls. Baseline: 25.11 sec Goal status: INITIAL  3.  Improved TUG to <= 25 sec to decrease fall risk Baseline: 38.93 sec Goal status: INITIAL  4.  Improved 6MWT to >= 150M showing increased walking endurance for community ambulation Baseline: 87.3 M Goal status: INITIAL  5.  Improved R hip flex/ABDADD by 1/2 to 1 full muscle grade to better assist with transfers and functional mobility.  Baseline: flex/ABDADD: 4-/4/4+ respectively Goal status: INITIAL  6.  Improved R knee strength by /2 to 1 full muscle grade to better assist with transfers and functional mobility Baseline: Flex 2/5, ext 4/5 Goal status: INITIAL  ASSESSMENT:  CLINICAL IMPRESSION: Reggie arrives after having been in the hospital for PE/DVT- monitored vitals today as appropriate. Progressed all mobility and gait based tasks and able and tolerated today after recent hospitalization. HR up to 123 at most/SpO2 WNL on RA  and had increased WOB with activities today but recovered well with seated rest. Continued focus with standing activities and WB with good alignment on RLE. Tolerated session well but I did give him extra rest breaks between activities to allow for recovery. Will continue efforts.    OBJECTIVE IMPAIRMENTS Abnormal gait, cardiopulmonary status limiting activity, decreased balance, decreased endurance, decreased strength, and impaired UE functional use.   ACTIVITY LIMITATIONS community activity.   PERSONAL FACTORS Time since onset of injury/illness/exacerbation and 1-2 comorbidities: Depression, HIV  are also affecting patient's functional outcome.    REHAB POTENTIAL: Good  CLINICAL DECISION MAKING: Evolving/moderate complexity  EVALUATION COMPLEXITY: Low  PLAN: PT FREQUENCY: 2x/week  PT DURATION: 8 weeks  PLANNED INTERVENTIONS: Therapeutic exercises, Therapeutic activity, Neuromuscular re-education, Balance training, Gait training, Patient/Family education, Stair training, Manual therapy, and Self Care  PLAN FOR NEXT SESSION: LE strengthening, cardio, gait including steps, curbs. Monitor HR/O2, recent hospitalization for PE/DVT   Ann Lions PT, DPT, PN2   Supplemental Physical Therapist [place]

## 2021-11-09 ENCOUNTER — Emergency Department (HOSPITAL_COMMUNITY): Payer: Medicaid Other

## 2021-11-09 ENCOUNTER — Ambulatory Visit: Payer: Medicaid Other | Admitting: Physical Therapy

## 2021-11-09 ENCOUNTER — Inpatient Hospital Stay (HOSPITAL_COMMUNITY)
Admission: EM | Admit: 2021-11-09 | Discharge: 2021-12-07 | DRG: 969 | Disposition: A | Discharge status: Home / Self Care | Payer: Medicaid Other | Attending: Family Medicine | Admitting: Family Medicine

## 2021-11-09 ENCOUNTER — Encounter (HOSPITAL_COMMUNITY): Payer: Self-pay | Admitting: Emergency Medicine

## 2021-11-09 ENCOUNTER — Other Ambulatory Visit: Payer: Self-pay

## 2021-11-09 DIAGNOSIS — R627 Adult failure to thrive: Secondary | ICD-10-CM | POA: Diagnosis present

## 2021-11-09 DIAGNOSIS — E860 Dehydration: Secondary | ICD-10-CM | POA: Diagnosis present

## 2021-11-09 DIAGNOSIS — Z888 Allergy status to other drugs, medicaments and biological substances status: Secondary | ICD-10-CM

## 2021-11-09 DIAGNOSIS — B20 Human immunodeficiency virus [HIV] disease: Principal | ICD-10-CM | POA: Diagnosis present

## 2021-11-09 DIAGNOSIS — J439 Emphysema, unspecified: Secondary | ICD-10-CM | POA: Diagnosis present

## 2021-11-09 DIAGNOSIS — F1410 Cocaine abuse, uncomplicated: Secondary | ICD-10-CM | POA: Diagnosis present

## 2021-11-09 DIAGNOSIS — I2510 Atherosclerotic heart disease of native coronary artery without angina pectoris: Secondary | ICD-10-CM | POA: Diagnosis present

## 2021-11-09 DIAGNOSIS — Z6823 Body mass index (BMI) 23.0-23.9, adult: Secondary | ICD-10-CM

## 2021-11-09 DIAGNOSIS — Z8673 Personal history of transient ischemic attack (TIA), and cerebral infarction without residual deficits: Secondary | ICD-10-CM

## 2021-11-09 DIAGNOSIS — N179 Acute kidney failure, unspecified: Secondary | ICD-10-CM | POA: Diagnosis not present

## 2021-11-09 DIAGNOSIS — Z7982 Long term (current) use of aspirin: Secondary | ICD-10-CM

## 2021-11-09 DIAGNOSIS — F1220 Cannabis dependence, uncomplicated: Secondary | ICD-10-CM | POA: Diagnosis present

## 2021-11-09 DIAGNOSIS — D649 Anemia, unspecified: Secondary | ICD-10-CM | POA: Diagnosis present

## 2021-11-09 DIAGNOSIS — R6510 Systemic inflammatory response syndrome (SIRS) of non-infectious origin without acute organ dysfunction: Secondary | ICD-10-CM | POA: Diagnosis present

## 2021-11-09 DIAGNOSIS — R7401 Elevation of levels of liver transaminase levels: Secondary | ICD-10-CM | POA: Diagnosis present

## 2021-11-09 DIAGNOSIS — R531 Weakness: Principal | ICD-10-CM

## 2021-11-09 DIAGNOSIS — R7989 Other specified abnormal findings of blood chemistry: Secondary | ICD-10-CM | POA: Diagnosis present

## 2021-11-09 DIAGNOSIS — G8929 Other chronic pain: Secondary | ICD-10-CM | POA: Diagnosis present

## 2021-11-09 DIAGNOSIS — R591 Generalized enlarged lymph nodes: Secondary | ICD-10-CM | POA: Diagnosis present

## 2021-11-09 DIAGNOSIS — E875 Hyperkalemia: Secondary | ICD-10-CM | POA: Diagnosis present

## 2021-11-09 DIAGNOSIS — R066 Hiccough: Secondary | ICD-10-CM | POA: Diagnosis not present

## 2021-11-09 DIAGNOSIS — K219 Gastro-esophageal reflux disease without esophagitis: Secondary | ICD-10-CM | POA: Diagnosis present

## 2021-11-09 DIAGNOSIS — I2699 Other pulmonary embolism without acute cor pulmonale: Secondary | ICD-10-CM | POA: Diagnosis not present

## 2021-11-09 DIAGNOSIS — R64 Cachexia: Secondary | ICD-10-CM | POA: Diagnosis present

## 2021-11-09 DIAGNOSIS — R000 Tachycardia, unspecified: Secondary | ICD-10-CM

## 2021-11-09 DIAGNOSIS — E43 Unspecified severe protein-calorie malnutrition: Secondary | ICD-10-CM | POA: Insufficient documentation

## 2021-11-09 DIAGNOSIS — B3781 Candidal esophagitis: Secondary | ICD-10-CM | POA: Diagnosis not present

## 2021-11-09 DIAGNOSIS — I700 Atherosclerosis of aorta: Secondary | ICD-10-CM | POA: Diagnosis present

## 2021-11-09 DIAGNOSIS — Z20822 Contact with and (suspected) exposure to covid-19: Secondary | ICD-10-CM | POA: Diagnosis present

## 2021-11-09 DIAGNOSIS — Z86718 Personal history of other venous thrombosis and embolism: Secondary | ICD-10-CM

## 2021-11-09 DIAGNOSIS — Z515 Encounter for palliative care: Secondary | ICD-10-CM

## 2021-11-09 DIAGNOSIS — E872 Acidosis, unspecified: Secondary | ICD-10-CM | POA: Diagnosis present

## 2021-11-09 DIAGNOSIS — A312 Disseminated mycobacterium avium-intracellulare complex (DMAC): Secondary | ICD-10-CM | POA: Diagnosis present

## 2021-11-09 DIAGNOSIS — R590 Localized enlarged lymph nodes: Secondary | ICD-10-CM

## 2021-11-09 DIAGNOSIS — J189 Pneumonia, unspecified organism: Secondary | ICD-10-CM

## 2021-11-09 DIAGNOSIS — F32A Depression, unspecified: Secondary | ICD-10-CM

## 2021-11-09 DIAGNOSIS — L0211 Cutaneous abscess of neck: Secondary | ICD-10-CM | POA: Diagnosis not present

## 2021-11-09 DIAGNOSIS — R29810 Facial weakness: Secondary | ICD-10-CM | POA: Diagnosis present

## 2021-11-09 DIAGNOSIS — A310 Pulmonary mycobacterial infection: Secondary | ICD-10-CM | POA: Diagnosis present

## 2021-11-09 DIAGNOSIS — G4700 Insomnia, unspecified: Secondary | ICD-10-CM | POA: Diagnosis present

## 2021-11-09 DIAGNOSIS — Z792 Long term (current) use of antibiotics: Secondary | ICD-10-CM

## 2021-11-09 DIAGNOSIS — Z7901 Long term (current) use of anticoagulants: Secondary | ICD-10-CM

## 2021-11-09 DIAGNOSIS — Z79899 Other long term (current) drug therapy: Secondary | ICD-10-CM

## 2021-11-09 DIAGNOSIS — E871 Hypo-osmolality and hyponatremia: Secondary | ICD-10-CM | POA: Diagnosis present

## 2021-11-09 DIAGNOSIS — F1910 Other psychoactive substance abuse, uncomplicated: Secondary | ICD-10-CM

## 2021-11-09 DIAGNOSIS — Z7951 Long term (current) use of inhaled steroids: Secondary | ICD-10-CM

## 2021-11-09 DIAGNOSIS — Z87891 Personal history of nicotine dependence: Secondary | ICD-10-CM

## 2021-11-09 DIAGNOSIS — R918 Other nonspecific abnormal finding of lung field: Secondary | ICD-10-CM | POA: Diagnosis present

## 2021-11-09 DIAGNOSIS — T368X6A Underdosing of other systemic antibiotics, initial encounter: Secondary | ICD-10-CM | POA: Diagnosis present

## 2021-11-09 DIAGNOSIS — Z7151 Drug abuse counseling and surveillance of drug abuser: Secondary | ICD-10-CM

## 2021-11-09 DIAGNOSIS — J9811 Atelectasis: Secondary | ICD-10-CM | POA: Diagnosis present

## 2021-11-09 DIAGNOSIS — A812 Progressive multifocal leukoencephalopathy: Secondary | ICD-10-CM | POA: Diagnosis present

## 2021-11-09 DIAGNOSIS — Z86711 Personal history of pulmonary embolism: Secondary | ICD-10-CM

## 2021-11-09 DIAGNOSIS — Z7189 Other specified counseling: Secondary | ICD-10-CM

## 2021-11-09 DIAGNOSIS — F332 Major depressive disorder, recurrent severe without psychotic features: Secondary | ICD-10-CM | POA: Diagnosis present

## 2021-11-09 DIAGNOSIS — I69351 Hemiplegia and hemiparesis following cerebral infarction affecting right dominant side: Secondary | ICD-10-CM

## 2021-11-09 DIAGNOSIS — K6812 Psoas muscle abscess: Secondary | ICD-10-CM | POA: Diagnosis not present

## 2021-11-09 DIAGNOSIS — R1312 Dysphagia, oropharyngeal phase: Secondary | ICD-10-CM

## 2021-11-09 LAB — CBC
HCT: 37.2 % — ABNORMAL LOW (ref 39.0–52.0)
Hemoglobin: 11.8 g/dL — ABNORMAL LOW (ref 13.0–17.0)
MCH: 28.2 pg (ref 26.0–34.0)
MCHC: 31.7 g/dL (ref 30.0–36.0)
MCV: 89 fL (ref 80.0–100.0)
Platelets: 411 10*3/uL — ABNORMAL HIGH (ref 150–400)
RBC: 4.18 MIL/uL — ABNORMAL LOW (ref 4.22–5.81)
RDW: 16.3 % — ABNORMAL HIGH (ref 11.5–15.5)
WBC: 6.6 10*3/uL (ref 4.0–10.5)
nRBC: 0 % (ref 0.0–0.2)

## 2021-11-09 LAB — URINALYSIS, ROUTINE W REFLEX MICROSCOPIC
Bacteria, UA: NONE SEEN
Bilirubin Urine: NEGATIVE
Glucose, UA: NEGATIVE mg/dL
Hgb urine dipstick: NEGATIVE
Ketones, ur: NEGATIVE mg/dL
Leukocytes,Ua: NEGATIVE
Nitrite: NEGATIVE
Protein, ur: 30 mg/dL — AB
Specific Gravity, Urine: 1.021 (ref 1.005–1.030)
pH: 5 (ref 5.0–8.0)

## 2021-11-09 LAB — LACTIC ACID, PLASMA
Lactic Acid, Venous: 2.6 mmol/L (ref 0.5–1.9)
Lactic Acid, Venous: 1.6 mmol/L (ref 0.5–1.9)

## 2021-11-09 LAB — PROCALCITONIN: Procalcitonin: 0.56 ng/mL

## 2021-11-09 LAB — I-STAT CHEM 8, ED
BUN: 25 mg/dL — ABNORMAL HIGH (ref 6–20)
Calcium, Ion: 1.24 mmol/L (ref 1.15–1.40)
Chloride: 107 mmol/L (ref 98–111)
Creatinine, Ser: 1.2 mg/dL (ref 0.61–1.24)
Glucose, Bld: 91 mg/dL (ref 70–99)
HCT: 34 % — ABNORMAL LOW (ref 39.0–52.0)
Hemoglobin: 11.6 g/dL — ABNORMAL LOW (ref 13.0–17.0)
Potassium: 6.3 mmol/L (ref 3.5–5.1)
Sodium: 131 mmol/L — ABNORMAL LOW (ref 135–145)
TCO2: 20 mmol/L — ABNORMAL LOW (ref 22–32)

## 2021-11-09 LAB — BASIC METABOLIC PANEL
Anion gap: 13 (ref 5–15)
BUN: 20 mg/dL (ref 6–20)
CO2: 19 mmol/L — ABNORMAL LOW (ref 22–32)
Calcium: 11.2 mg/dL — ABNORMAL HIGH (ref 8.9–10.3)
Chloride: 105 mmol/L (ref 98–111)
Creatinine, Ser: 1.34 mg/dL — ABNORMAL HIGH (ref 0.61–1.24)
GFR, Estimated: >60 mL/min (ref >60)
Glucose, Bld: 99 mg/dL (ref 70–99)
Potassium: 4.9 mmol/L (ref 3.5–5.1)
Sodium: 137 mmol/L (ref 135–145)

## 2021-11-09 LAB — DIFFERENTIAL
Abs Immature Granulocytes: 0 10*3/uL (ref 0.00–0.07)
Basophils Absolute: 0 10*3/uL (ref 0.0–0.1)
Basophils Relative: 0 %
Eosinophils Absolute: 0.1 10*3/uL (ref 0.0–0.5)
Eosinophils Relative: 1 %
Lymphocytes Relative: 20 %
Lymphs Abs: 1.3 10*3/uL (ref 0.7–4.0)
Monocytes Absolute: 0.5 10*3/uL (ref 0.1–1.0)
Monocytes Relative: 8 %
Neutro Abs: 4.7 10*3/uL (ref 1.7–7.7)
Neutrophils Relative %: 71 %

## 2021-11-09 LAB — HEPATIC FUNCTION PANEL
ALT: 115 U/L — ABNORMAL HIGH (ref 0–44)
AST: 77 U/L — ABNORMAL HIGH (ref 15–41)
Albumin: 3.6 g/dL (ref 3.5–5.0)
Alkaline Phosphatase: 344 U/L — ABNORMAL HIGH (ref 38–126)
Bilirubin, Direct: 0.1 mg/dL (ref 0.0–0.2)
Indirect Bilirubin: 0.4 mg/dL (ref 0.3–0.9)
Total Bilirubin: 0.5 mg/dL (ref 0.3–1.2)
Total Protein: 9.5 g/dL — ABNORMAL HIGH (ref 6.5–8.1)

## 2021-11-09 LAB — APTT: aPTT: 55 seconds — ABNORMAL HIGH (ref 24–36)

## 2021-11-09 LAB — PROTIME-INR
INR: 2.3 — ABNORMAL HIGH (ref 0.8–1.2)
Prothrombin Time: 25.3 seconds — ABNORMAL HIGH (ref 11.4–15.2)

## 2021-11-09 LAB — SARS CORONAVIRUS 2 BY RT PCR: SARS Coronavirus 2 by RT PCR: NEGATIVE

## 2021-11-09 LAB — CBG MONITORING, ED: Glucose-Capillary: 99 mg/dL (ref 70–99)

## 2021-11-09 MED ORDER — PANTOPRAZOLE SODIUM 40 MG PO TBEC
40.0000 mg | DELAYED_RELEASE_TABLET | Freq: Every day | ORAL | Status: DC
  Administered 2021-11-10 – 2021-12-07 (×26): 40 mg via ORAL
  Filled 2021-11-09 (×26): qty 1

## 2021-11-09 MED ORDER — UMECLIDINIUM BROMIDE 62.5 MCG/ACT IN AEPB
1.0000 | INHALATION_SPRAY | Freq: Every day | RESPIRATORY_TRACT | Status: DC
Start: 2021-11-10 — End: 2021-12-07
  Administered 2021-11-10 – 2021-12-07 (×5): 1 via RESPIRATORY_TRACT
  Filled 2021-11-09: qty 7

## 2021-11-09 MED ORDER — LACTATED RINGERS IV SOLN
INTRAVENOUS | Status: AC
Start: 2021-11-09 — End: 2021-11-10

## 2021-11-09 MED ORDER — LACTATED RINGERS IV BOLUS
1000.0000 mL | Freq: Once | INTRAVENOUS | Status: AC
  Administered 2021-11-09: 1000 mL via INTRAVENOUS

## 2021-11-09 MED ORDER — HYDROCODONE-ACETAMINOPHEN 5-325 MG PO TABS
1.0000 | ORAL_TABLET | Freq: Four times a day (QID) | ORAL | Status: AC | PRN
  Administered 2021-11-09: 1 via ORAL
  Filled 2021-11-09: qty 1

## 2021-11-09 MED ORDER — QUETIAPINE FUMARATE 100 MG PO TABS
200.0000 mg | ORAL_TABLET | Freq: Every day | ORAL | Status: DC
  Administered 2021-11-09 – 2021-12-06 (×28): 200 mg via ORAL
  Filled 2021-11-09 (×29): qty 2

## 2021-11-09 MED ORDER — MIRTAZAPINE 15 MG PO TABS
15.0000 mg | ORAL_TABLET | Freq: Every day | ORAL | Status: DC
  Administered 2021-11-09 – 2021-12-06 (×28): 15 mg via ORAL
  Filled 2021-11-09 (×28): qty 1

## 2021-11-09 MED ORDER — MEGESTROL ACETATE 400 MG/10ML PO SUSP
400.0000 mg | Freq: Every morning | ORAL | Status: DC
Start: 2021-11-10 — End: 2021-11-12
  Administered 2021-11-10 – 2021-11-12 (×3): 400 mg via ORAL
  Filled 2021-11-09 (×3): qty 10

## 2021-11-09 MED ORDER — ALBUTEROL SULFATE (2.5 MG/3ML) 0.083% IN NEBU
3.0000 mL | INHALATION_SOLUTION | Freq: Four times a day (QID) | RESPIRATORY_TRACT | Status: DC | PRN
  Administered 2021-11-12: 3 mL via RESPIRATORY_TRACT
  Filled 2021-11-09: qty 3

## 2021-11-09 MED ORDER — IOHEXOL 300 MG/ML  SOLN
75.0000 mL | Freq: Once | INTRAMUSCULAR | Status: AC | PRN
  Administered 2021-11-09: 75 mL via INTRAVENOUS

## 2021-11-09 MED ORDER — SULFAMETHOXAZOLE-TRIMETHOPRIM 800-160 MG PO TABS
1.0000 | ORAL_TABLET | Freq: Every day | ORAL | Status: DC
  Administered 2021-11-10: 1 via ORAL
  Filled 2021-11-09: qty 1

## 2021-11-09 MED ORDER — PIPERACILLIN-TAZOBACTAM 3.375 G IVPB 30 MIN
3.3750 g | INTRAVENOUS | Status: AC
  Administered 2021-11-09: 3.375 g via INTRAVENOUS
  Filled 2021-11-09: qty 50

## 2021-11-09 MED ORDER — BICTEGRAVIR-EMTRICITAB-TENOFOV 50-200-25 MG PO TABS
1.0000 | ORAL_TABLET | Freq: Every day | ORAL | Status: DC
  Administered 2021-11-09 – 2021-11-23 (×15): 1 via ORAL
  Filled 2021-11-09 (×15): qty 1

## 2021-11-09 MED ORDER — MOMETASONE FURO-FORMOTEROL FUM 200-5 MCG/ACT IN AERO
2.0000 | INHALATION_SPRAY | Freq: Two times a day (BID) | RESPIRATORY_TRACT | Status: DC
  Administered 2021-11-10 – 2021-12-07 (×47): 2 via RESPIRATORY_TRACT
  Filled 2021-11-09 (×2): qty 8.8

## 2021-11-09 MED ORDER — VANCOMYCIN HCL 1500 MG/300ML IV SOLN
1500.0000 mg | INTRAVENOUS | Status: AC
  Administered 2021-11-09: 1500 mg via INTRAVENOUS
  Filled 2021-11-09: qty 300

## 2021-11-09 MED ORDER — POLYETHYLENE GLYCOL 3350 17 G PO PACK: 17.0000 g | PACK | Freq: Every day | ORAL | Status: DC | PRN

## 2021-11-09 MED ORDER — ACETAMINOPHEN 650 MG RE SUPP: 650.0000 mg | Freq: Four times a day (QID) | RECTAL | Status: DC | PRN

## 2021-11-09 MED ORDER — ETHAMBUTOL HCL 400 MG PO TABS
800.0000 mg | ORAL_TABLET | Freq: Every day | ORAL | Status: DC
  Administered 2021-11-10 – 2021-12-07 (×28): 800 mg via ORAL
  Filled 2021-11-09 (×28): qty 2

## 2021-11-09 MED ORDER — METOPROLOL SUCCINATE ER 50 MG PO TB24
50.0000 mg | ORAL_TABLET | Freq: Every morning | ORAL | Status: DC
  Administered 2021-11-10 – 2021-11-17 (×8): 50 mg via ORAL
  Filled 2021-11-09 (×8): qty 1

## 2021-11-09 MED ORDER — RIVAROXABAN 15 MG PO TABS
15.0000 mg | ORAL_TABLET | Freq: Two times a day (BID) | ORAL | Status: DC
  Administered 2021-11-09 – 2021-11-11 (×4): 15 mg via ORAL
  Filled 2021-11-09 (×5): qty 1

## 2021-11-09 MED ORDER — ROSUVASTATIN CALCIUM 5 MG PO TABS
5.0000 mg | ORAL_TABLET | Freq: Every morning | ORAL | Status: DC
  Administered 2021-11-10 – 2021-12-07 (×27): 5 mg via ORAL
  Filled 2021-11-09 (×27): qty 1

## 2021-11-09 MED ORDER — LACTATED RINGERS IV BOLUS
2000.0000 mL | Freq: Once | INTRAVENOUS | Status: AC
  Administered 2021-11-09: 2000 mL via INTRAVENOUS

## 2021-11-09 MED ORDER — FENTANYL CITRATE PF 50 MCG/ML IJ SOSY
50.0000 ug | PREFILLED_SYRINGE | Freq: Once | INTRAMUSCULAR | Status: AC
  Administered 2021-11-09: 50 ug via INTRAVENOUS
  Filled 2021-11-09: qty 1

## 2021-11-09 MED ORDER — SODIUM CHLORIDE 0.9% FLUSH
3.0000 mL | Freq: Two times a day (BID) | INTRAVENOUS | Status: DC
Start: 2021-11-09 — End: 2021-12-07
  Administered 2021-11-11 – 2021-12-07 (×35): 3 mL via INTRAVENOUS

## 2021-11-09 MED ORDER — AZITHROMYCIN 250 MG PO TABS
500.0000 mg | ORAL_TABLET | Freq: Every day | ORAL | Status: DC
  Administered 2021-11-09: 500 mg via ORAL
  Filled 2021-11-09: qty 2

## 2021-11-09 MED ORDER — GUAIFENESIN-DM 100-10 MG/5ML PO SYRP
5.0000 mL | ORAL_SOLUTION | ORAL | Status: DC | PRN
  Administered 2021-11-09 – 2021-11-28 (×15): 5 mL via ORAL
  Filled 2021-11-09 (×15): qty 10

## 2021-11-09 MED ORDER — ACETAMINOPHEN 325 MG PO TABS
650.0000 mg | ORAL_TABLET | Freq: Four times a day (QID) | ORAL | Status: DC | PRN
  Administered 2021-11-10 – 2021-12-07 (×25): 650 mg via ORAL
  Filled 2021-11-09 (×25): qty 2

## 2021-11-09 NOTE — ED Provider Notes (Signed)
4:15 PM-checkout from Dr. Tyrone Nine to evaluate patient, after return of testing and arrange for disposition.  He presented earlier today with cough and shortness of breath.  He has been hospitalized recently for pneumonia.  He arrived tachycardic with concerns for dehydration.  Patient has ongoing diagnosis of AIDS.  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  Patient was started on Xarelto during last hospitalization, when he was treated for pneumonia.  Discharged on 11/02/2021.  Plans were being made for outpatient bronchoscopy to evaluate right upper lung mass.  Mass is previously noted with adenopathy, suspicious for cancer.  ED testing-chest x-ray with infiltrate versus cancer right upper lobe, lingula region.  CBC with normal white count and slightly low hemoglobin.  Urinalysis negative.  Lactic acid elevated.  Initial potassium high on i-STAT 8, Bmet potassium normal, elevated creatinine.  Hepatic function with elevated protein, AST, ALT and alkaline phosphatase.  6:10 PM-persistently tachycardic and coughing persistently but not coughing up any secretions.  Patient and mother in the room, findings discussed and questions answered.  Lactate elevated, initially.  Delta pending.  Ordered third bolus of lactated Ringer's, and empiric antibiotics to cover for complicated pneumonia with Zosyn and vancomycin.  Chest x-ray not completely diagnostic, will order IV contrast chest CT.   .Critical Care Performed by: Daleen Bo, MD Authorized by: Daleen Bo, MD   Critical care provider statement:    Critical care time (minutes):  35   Critical care start time:  11/09/2021 4:15 PM   Critical care end time:  11/09/2021 6:21 PM   Critical care time was exclusive of:  Separately billable procedures and treating other patients   Critical care was necessary to treat or prevent imminent or life-threatening  deterioration of the following conditions:  Respiratory failure   Critical care was time spent personally by me on the following activities:  Blood draw for specimens, development of treatment plan with patient or surrogate, discussions with consultants, evaluation of patient's response to treatment, examination of patient, ordering and performing treatments and interventions, ordering and review of laboratory studies, ordering and review of radiographic studies, pulse oximetry, re-evaluation of patient's condition and review of old charts  Case discussed with hospitalist arrange for admission and further care and treatment.  We discussed implications of CT imaging with IV contrast to evaluate for mass or pneumonia, versus CTA to evaluate for progressive venous thromboembolism disorder.    Daleen Bo, MD 11/09/21 [phone]

## 2021-11-09 NOTE — H&P (Signed)
History and Physical   Kurt Foley YRC:[id number] DOB: 26-May-1976 DOA: 11/09/2021  PCP: Claiborne Rigg, NP   Patient coming from: Home  Chief Complaint: Shortness of breath, feeling "dehydrated "  HPI: Kurt Foley is a 46 y.o. male with medical history significant of substance use, right upper lobe mass, HIV, PML, MAI, emphysema, CVA, PE, depression presenting with weakness and shortness of breath.  Patient recently admitted from 5/20 until 5/24 with shortness of breath which is multifactorial.  Noted to have DVT and PE with right heart strain but no right heart failure started discharged on Xarelto, also noted to have a right upper lobe mass due to be evaluated by bronchoscopy by pulmonology after discharge has not yet been done, also with history of MAI on ethambutol, also with HIV/AIDS with CD4 of 46 on suppressive antibiotics, questionable pneumonia was covered with antibiotics.  He states he has been doing well since discharge but worsened in the past 2-3 days.  Noticed cough with sometimes painful cough and sensation of his heart racing.  As above also notes shortness of breath.  Feels like he is dehydrated and has noticed decreased urine output. Also reports creased appetite and decreased PO intake.  He denies fevers, chills, chest pain, abdominal pain, constipation, diarrhea, nausea, vomiting.  ED Course: Vital signs in the ED significant for tachycardia in the 120s to 130s, respiratory rate in the 30s, blood pressure in the 100s to 110s.  Saturating well on room air.  Lab work-up included CMP with bicarb 19, creatinine elevated to 1.34 from baseline of around 1, calcium 11.2, protein 9.5, AST ALT and alk phos all stably elevated at 77, 115, 349 respectively.  CBC showed hemoglobin stable 11.8, platelets 411.  PT and INR elevated at 25 and 2.3 respectively.  PTT elevated 55.  Lactic acid mildly elevated at 2.6 with repeat pending.  Respiratory panel flu and COVID-negative.   Urinalysis normal.  Urine culture, blood culture, procalcitonin pending.  Chest x-ray showed stable right upper lobe and lingular density with evidence of possible adenopathy and evidence of emphysema.  CT chest ordered and is pending.  Patient received vancomycin and Zosyn in the ED as well as 3 L of IV fluids, his home Biktarvy and Xarelto, dose of fentanyl.  Review of Systems: As per HPI otherwise all other systems reviewed and are negative.  Past Medical History:  Diagnosis Date   Depression    HIV infection (HCC)     History reviewed. No pertinent surgical history.  Social History  reports that he has quit smoking. His smoking use included cigarettes. He has a 12.50 pack-year smoking history. He has never used smokeless tobacco. He reports that he does not currently use alcohol after a past usage of about 2.0 standard drinks per week. He reports that he does not currently use drugs after having used the following drugs: Marijuana and Cocaine.  Allergies  Allergen Reactions   Lidocaine Other (See Comments)    Large bruise at site of patch placement    Family History  Problem Relation Age of Onset   Rheum arthritis Mother    Diabetes Mother    Hypertension Mother    Arthritis Mother   Reviewed on admission  Prior to Admission medications   Medication Sig Start Date End Date Taking? Authorizing Provider  acetaminophen (TYLENOL) 325 MG tablet Take 1-2 tablets (325-650 mg total) by mouth every 4 (four) hours as needed for mild pain. 08/05/21  Yes Love, Kurt Foley  S, PA-C  albuterol (VENTOLIN HFA) 108 (90 Base) MCG/ACT inhaler Inhale 2 puffs into the lungs every 6 hours as needed for wheezing or shortness of breath. 11/02/21  Yes Amin, Kurt Chirag, MD  azithromycin (ZITHROMAX) 500 MG tablet Take 1 tablet by mouth at bedtime. 10/07/21  Yes Kurt Riding, MD  bictegravir-emtricitabine-tenofovir AF (BIKTARVY) 50-200-25 MG TABS tablet Take 1 tablet by mouth daily. Patient taking differently:  Take 1 tablet by mouth every morning. 08/05/21  Yes Love, Kurt Anchors, PA-C  budesonide-formoterol (SYMBICORT) 160-4.5 MCG/ACT inhaler Inhale 2 puffs into the lungs in the morning and at bedtime. 10/07/21  Yes Kurt Riding, MD  loratadine (CLARITIN) 10 MG tablet Take 1 tablet (10 mg total) by mouth daily. Patient taking differently: Take 10 mg by mouth every morning. 09/14/21  Yes Comer, Kurt Regal, MD  megestrol (MEGACE) 400 MG/10ML suspension Take 10 mLs (400 mg total) by mouth daily. Patient taking differently: Take 400 mg by mouth every morning. 08/06/21  Yes Love, Kurt Anchors, PA-C  metoprolol succinate (TOPROL-XL) 50 MG 24 hr tablet Take 1 tablet by mouth every morning. Take with or immediately following a meal. 10/07/21  Yes Foley, Kurt T, MD  mirtazapine (REMERON) 15 MG tablet Take 1 tablet (15 mg total) by mouth at bedtime. 08/05/21  Yes Love, Kurt Anchors, PA-C  Multiple Vitamins-Minerals (CERTAVITE/ANTIOXIDANTS) TABS Take 1 tablet by mouth daily. 08/05/21  Yes Love, Kurt Anchors, PA-C  nystatin cream (MYCOSTATIN) Apply 1 application. topically 2 (two) times daily. Patient taking differently: Apply 1 application. topically 2 (two) times daily as needed for dry skin. 10/07/21  Yes Kurt Riding, MD  pantoprazole (PROTONIX) 40 MG tablet Take 1 tablet by mouth daily before breakfast. 11/02/21 12/02/21 Yes Amin, Kurt Chirag, MD  polyethylene glycol (MIRALAX / GLYCOLAX) 17 g packet Take 17 g by mouth daily as needed for mild constipation or moderate constipation. 07/20/21  Yes Foley, Laxman, MD  QUEtiapine (SEROQUEL) 200 MG tablet Take 1 tablet (200 mg total) by mouth at bedtime. 08/05/21  Yes Love, Kurt Anchors, PA-C  RIVAROXABAN Alveda Reasons) VTE STARTER PACK (15 & 20 MG) Follow package directions: Take one $Remove'15mg'zOxwgCR$  tablet by mouth twice a day. On day 22, switch to one $Remo'20mg'MGKYE$  tablet once a day. Take with food. 11/02/21  Yes Amin, Kurt Chirag, MD  rosuvastatin (CRESTOR) 5 MG tablet Take 1 tablet (5 mg total) by mouth daily. Patient  taking differently: Take 5 mg by mouth every morning. 08/17/21  Yes Kurt Pounds, NP  sulfamethoxazole-trimethoprim (BACTRIM DS) 800-160 MG tablet Take 1 tablet by mouth daily. 10/07/21  Yes Kurt Riding, MD  umeclidinium bromide (INCRUSE ELLIPTA) 62.5 MCG/ACT AEPB Inhale 1 puff into the lungs daily. 10/07/21  Yes Kurt Riding, MD  aspirin 81 MG chewable tablet Chew 1 tablet (81 mg total) by mouth daily. Patient not taking: Reported on 11/09/2021 08/05/21   Love, Kurt Anchors, PA-C  ethambutol (MYAMBUTOL) 400 MG tablet Take 2 tablets by mouth daily. 10/07/21   Kurt Riding, MD  senna-docusate (SENOKOT-S) 8.6-50 MG tablet Take 1 tablet by mouth 2 (two) times daily. Patient not taking: Reported on 10/31/2021 08/05/21   Flora Lipps    Physical Exam: Vitals:   11/09/21 1600 11/09/21 1630 11/09/21 1903 11/09/21 1915  BP: 116/90 113/88 114/77 119/87  Pulse: (!) 139 (!) 137 (!) 132 (!) 133  Resp: (!) 29 (!) 45 (!) 22 (!) 40  Temp:      TempSrc:  SpO2: 95% 95% 96% 99%    Physical Exam Constitutional:      General: He is not in acute distress.    Appearance: Normal appearance.  HENT:     Head: Normocephalic and atraumatic.     Mouth/Throat:     Mouth: Mucous membranes are moist.     Pharynx: Oropharynx is clear.  Eyes:     Extraocular Movements: Extraocular movements intact.     Pupils: Pupils are equal, round, and reactive to light.  Cardiovascular:     Rate and Rhythm: Regular rhythm. Tachycardia present.     Pulses: Normal pulses.     Heart sounds: Normal heart sounds.  Pulmonary:     Effort: Tachypnea present. No respiratory distress.     Breath sounds: Decreased breath sounds present.  Abdominal:     General: Bowel sounds are normal. There is no distension.     Palpations: Abdomen is soft.     Tenderness: There is no abdominal tenderness.  Musculoskeletal:        General: No swelling or deformity.  Skin:    General: Skin is warm and dry.  Neurological:     General:  No focal deficit present.     Mental Status: Mental status is at baseline.   Labs on Admission: I have personally reviewed following labs and imaging studies  CBC: Recent Labs  Lab 11/09/21 1541 11/09/21 1600  WBC 6.6  --   NEUTROABS 4.7  --   HGB 11.8* 11.6*  HCT 37.2* 34.0*  MCV 89.0  --   PLT 411*  --     Basic Metabolic Panel: Recent Labs  Lab 11/09/21 1541 11/09/21 1600  NA 137 131*  K 4.9 6.3*  CL 105 107  CO2 19*  --   GLUCOSE 99 91  BUN 20 25*  CREATININE 1.34* 1.20  CALCIUM 11.2*  --     GFR: Estimated Creatinine Clearance: 68.7 mL/min (by C-G formula based on SCr of 1.2 mg/dL).  Liver Function Tests: Recent Labs  Lab 11/09/21 1541  AST 77*  ALT 115*  ALKPHOS 344*  BILITOT 0.5  PROT 9.5*  ALBUMIN 3.6    Urine analysis:    Component Value Date/Time   COLORURINE YELLOW 11/09/2021 1501   APPEARANCEUR CLEAR 11/09/2021 1501   LABSPEC 1.021 11/09/2021 1501   PHURINE 5.0 11/09/2021 1501   GLUCOSEU NEGATIVE 11/09/2021 1501   HGBUR NEGATIVE 11/09/2021 1501   BILIRUBINUR NEGATIVE 11/09/2021 1501   KETONESUR NEGATIVE 11/09/2021 1501   PROTEINUR 30 (A) 11/09/2021 1501   UROBILINOGEN 1.0 03/20/2015 0000   NITRITE NEGATIVE 11/09/2021 1501   LEUKOCYTESUR NEGATIVE 11/09/2021 1501    Radiological Exams on Admission: CT Chest W Contrast  Result Date: 11/09/2021 CLINICAL DATA:  Short of breath, weakness, poor appetite for several days, lung mass, HIV EXAM: CT CHEST WITH CONTRAST TECHNIQUE: Multidetector CT imaging of the chest was performed during intravenous contrast administration. RADIATION DOSE REDUCTION: This exam was performed according to the departmental dose-optimization program which includes automated exposure control, adjustment of the mA and/or kV according to patient size and/or use of iterative reconstruction technique. CONTRAST:  82mL OMNIPAQUE IOHEXOL 300 MG/ML  SOLN COMPARISON:  10/29/2021 FINDINGS: Cardiovascular: This examination is not  tailored for the evaluation of the pulmonary vasculature. The right lower lobe pulmonary emboli seen on prior exam are less pronounced on this study. The heart is unremarkable without pericardial effusion. Diffuse coronary artery atherosclerosis is unchanged. No evidence of thoracic aortic aneurysm or dissection. Stable  aortic atherosclerosis. Mediastinum/Nodes: Stable soft tissue at the left hilum measuring approximate 4.2 x 2.4 cm image 57/2, consistent with lymphadenopathy or primary bronchogenic malignancy. Right hilar adenopathy measuring up to 1.5 cm in short axis unchanged. Stable subcarinal lymph node measuring up to 1.4 cm in short axis. Thyroid, trachea, and esophagus are unremarkable. Lungs/Pleura: Severe bullous emphysematous changes are again noted. Spiculated left upper lobe nodule image 65/5 measures 1 cm, not appreciably changed. Rounded 9 mm left upper lobe solid nodule image 50/5 unchanged. Subpleural consolidation with spiculated margins in the right upper lobe measures 3.6 x 1.3 cm image 34/5, not appreciably changed. No acute airspace disease, effusion, or pneumothorax. Central airways are patent. Upper Abdomen: No acute abnormality. Musculoskeletal: No acute or destructive bony lesions. Reconstructed images demonstrate no additional findings. IMPRESSION: 1. Decreased prominence of the right lower lobe pulmonary emboli, with apparent resolution of the remaining pulmonary emboli seen previously. Evaluation of the pulmonary vasculature is limited on this exam due to technique and timing of contrast bolus. 2. Left upper lobe nodules and right upper lobe masslike consolidation, underlying malignancy not excluded. Follow-up PET CT may be useful if the patient would be a therapy candidate should neoplasm be detected. 3. Stable mediastinal and hilar lymphadenopathy. Differential includes metastatic disease or primary lymphoproliferative disorder in this patient with history of HIV. 4. Aortic  Atherosclerosis (ICD10-I70.0) and Emphysema (ICD10-J43.9). Electronically Signed   By: Randa Ngo M.D.   On: 11/09/2021 18:51   DG Chest Port 1 View  Result Date: 11/09/2021 CLINICAL DATA:  Shortness of breath and weakness.  Poor appetite. EXAM: PORTABLE CHEST 1 VIEW COMPARISON:  Multiple exams, including CTA chest 10/29/2021 FINDINGS: 3.5 by 1.6 cm right upper lobe nodular density. This is roughly similar to prior. Indistinct nodularity in the lingula. The smaller left upper lobe pulmonary nodule shown on chest CT is not readily apparent. Emphysema. Heart size is within normal limits. Indistinct AP window compatible with previously demonstrated adenopathy. No blunting of the costophrenic angles. IMPRESSION: 1. Stable appearance of right upper lobe and lingular densities. Lung cancer is not excluded. 2. Effacement of the AP window compatible with known adenopathy in this vicinity. 3.  Emphysema (ICD10-J43.9).  / Electronically Signed   By: Van Clines M.D.   On: 11/09/2021 14:51    EKG: Independently reviewed.  Sinus tachycardia at 136 bpm.  Nonspecific T wave flattening multiple leads.  Assessment/Plan Principal Problem:   SIRS (systemic inflammatory response syndrome) (HCC) Active Problems:   Mass of upper lobe of right lung   Human immunodeficiency virus (HIV) disease (HCC)   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HCC)   Emphysema, unspecified (HCC)   Abnormal LFTs   History of CVA with residual right hemiparesis   Pulmonary embolism (HCC)   Severe recurrent major depression without psychotic features ([place])   Insomnia   SIRS > Patient presenting with shortness of breath and tachycardia with heart rate in the 130s and respiratory rate in the 30s qualifying for SIRS criteria.  Unclear if there is any true source to make this sepsis considering there is no leukocytosis, however he is immunosuppressed with HIV and recent CD4 of 46. > So far urinalysis is without  infection and chest x-ray shows stable changes from previous was treated for possible pneumonia during recent admission and is covered with prophylactic antibiotics of Bactrim as well as being on ethambutol for MAI. > Possible etiology of his tachycardia and tachypnea is multifactorial due to possible dehydration, recent lung disease, recent pulmonary  embolus, recently discovered lung mass, history of emphysema on inhalers. > Has received vancomycin and Zosyn in the ED to cover for possible infection, procalcitonin is pending.  CT of the chest is also pending.  Initial lactic acid was elevated at 2.6. - We will monitor on progressive for now considering concern for possible instability as he has not yet improved in the ED. - We will hold off on further antibiotics pending results of procalcitonin and CT chest - Follow-up procalcitonin - Continue with home Xarelto - Continue with home Bactrim and ethambutol - Trend lactic acid - Continue with IV fluids - Follow-up CT scan  AKI > Creatinine elevated to 1.34 from baseline of 1.  Patient states he feels dehydrated. > Has received IV fluids in the ED. > Also noted mildly elevated calcium, will continue to monitor for response to fluids - We will continue with IV fluids - Trend renal function electrolytes  Pulmonary embolus > Diagnosed during recent admission.  Is getting repeat CT scan will see if this contrasted scan shows any progression.  Does appear to be taking his Xarelto as his coag studies are elevated.  Possibility of hypercoagulability due to suspicious lung mass that has not yet been fully evaluated. - Continue home Xarelto - Follow-up CT  Right upper lobe mass > Noted during recent admission, somewhat suspicious for cancer due to adenopathy as well.  Appears stable chest x-ray.  CT scan has been ordered and is pending. - Continue to monitor - Follow-up repeat CT scan  History of CVA > Residual right hemiparesis - Continue home  rosuvastatin  Emphysema - Replace home Symbicort with formulary Dulera - Continue home Incruse - As needed albuterol  HIV MAI > Last CD4 count earlier this month was 46.  Currently on prophylactic Bactrim.  Is also on ethambutol for MAI. - Continue home Bactrim and Azithro for MAI - Continue home Biktarvy  Substance use > History of polysubstance use including marijuana, cocaine, alcohol - Continue to monitor  Depression - Continue home Seroquel and mirtazapine  GERD - Continue home PPI  Transaminitis > Chronic transaminitis currently stable with AST 77, ALT 115, alk phos 349. - Trend CMP  DVT prophylaxis: Xarelto Code Status:   Full  Family Communication:  Updated Mother By Phone Disposition Plan:   Patient is from:  Home  Anticipated DC to:  Home  Anticipated DC date:  1 to 7 days  Anticipated DC barriers: None  Consults called:  None Admission status:  Observation, progressive  Severity of Illness: The appropriate patient status for this patient is OBSERVATION. Observation status is judged to be reasonable and necessary in order to provide the required intensity of service to ensure the patient's safety. The patient's presenting symptoms, physical exam findings, and initial radiographic and laboratory data in the context of their medical condition is felt to place them at decreased risk for further clinical deterioration. Furthermore, it is anticipated that the patient will be medically stable for discharge from the hospital within 2 midnights of admission.   Marcelyn Bruins MD [facility]  How to contact the [facility] Attending or Consulting provider [place] or covering provider during after hours [place], for this patient?   Check the care team in [facility] and look for a) attending/consulting [facility] provider listed and b) the [facility] team listed Log into [email or web address] and use [place]'s universal password to access. If you do not have the password, please contact the  hospital operator. Locate the  [place] provider you are looking for under [facility] and page to a number that you can be directly reached. If you still have difficulty reaching the provider, please page the [facility] (Director on Call) for the Hospitalists listed on amion for assistance.  11/09/2021, 7:26 PM

## 2021-11-09 NOTE — ED Triage Notes (Signed)
Pt reports SHOB, weakness, and poor appetite x a few days.

## 2021-11-09 NOTE — Progress Notes (Signed)
A consult was received from an ED physician for Vancomycin and Zosyn per pharmacy dosing.  The patient's profile has been reviewed for ht/wt/allergies/indication/available labs.    A one time order has been placed for Vancomycin 1500mg  IV and Zosyn 3.375g IV.  Further antibiotics/pharmacy consults should be ordered by admitting physician if indicated.                       Thank you, 11/09/2021  6:22 PM

## 2021-11-09 NOTE — ED Provider Notes (Signed)
[name] Provider Note   CSN: [id number] Arrival date & time: 11/09/21  1412     History  Chief Complaint  Patient presents with   Shortness of Breath   Weakness    Kurt Foley is a 46 y.o. male.  46 yo M with a chief complaints cough shortness of breath and feeling dehydrated.  This has been going on for couple days.  The patient was recently in the hospital for opportunistic pneumonia and had improved but gotten worse as stated.  He denies any significant chest pain.  This feeling his heart racing.  Has some abdominal discomfort with coughing.  Feels like he has to urinate and then does not have much urinary output.  Denies any pain with urination.  Denies diarrhea.   Shortness of Breath Weakness Associated symptoms: shortness of breath       Home Medications Prior to Admission medications   Medication Sig Start Date End Date Taking? Authorizing Provider  acetaminophen (TYLENOL) 325 MG tablet Take 1-2 tablets (325-650 mg total) by mouth every 4 (four) hours as needed for mild pain. 08/05/21  Yes Love, Evlyn Kanner, PA-C  albuterol (VENTOLIN HFA) 108 (90 Base) MCG/ACT inhaler Inhale 2 puffs into the lungs every 6 hours as needed for wheezing or shortness of breath. 11/02/21  Yes Amin, Ankit Chirag, MD  azithromycin (ZITHROMAX) 500 MG tablet Take 1 tablet by mouth at bedtime. 10/07/21  Yes Almon Hercules, MD  bictegravir-emtricitabine-tenofovir AF (BIKTARVY) 50-200-25 MG TABS tablet Take 1 tablet by mouth daily. Patient taking differently: Take 1 tablet by mouth every morning. 08/05/21  Yes Love, Evlyn Kanner, PA-C  budesonide-formoterol (SYMBICORT) 160-4.5 MCG/ACT inhaler Inhale 2 puffs into the lungs in the morning and at bedtime. 10/07/21  Yes Almon Hercules, MD  loratadine (CLARITIN) 10 MG tablet Take 1 tablet (10 mg total) by mouth daily. Patient taking differently: Take 10 mg by mouth every morning. 09/14/21  Yes Comer, Belia Heman, MD  megestrol  (MEGACE) 400 MG/10ML suspension Take 10 mLs (400 mg total) by mouth daily. Patient taking differently: Take 400 mg by mouth every morning. 08/06/21  Yes Love, Evlyn Kanner, PA-C  metoprolol succinate (TOPROL-XL) 50 MG 24 hr tablet Take 1 tablet by mouth every morning. Take with or immediately following a meal. 10/07/21  Yes Gonfa, Taye T, MD  mirtazapine (REMERON) 15 MG tablet Take 1 tablet (15 mg total) by mouth at bedtime. 08/05/21  Yes Love, Evlyn Kanner, PA-C  Multiple Vitamins-Minerals (CERTAVITE/ANTIOXIDANTS) TABS Take 1 tablet by mouth daily. 08/05/21  Yes Love, Evlyn Kanner, PA-C  nystatin cream (MYCOSTATIN) Apply 1 application. topically 2 (two) times daily. Patient taking differently: Apply 1 application. topically 2 (two) times daily as needed for dry skin. 10/07/21  Yes Almon Hercules, MD  pantoprazole (PROTONIX) 40 MG tablet Take 1 tablet by mouth daily before breakfast. 11/02/21 12/02/21 Yes Amin, Ankit Chirag, MD  polyethylene glycol (MIRALAX / GLYCOLAX) 17 g packet Take 17 g by mouth daily as needed for mild constipation or moderate constipation. 07/20/21  Yes Pokhrel, Laxman, MD  QUEtiapine (SEROQUEL) 200 MG tablet Take 1 tablet (200 mg total) by mouth at bedtime. 08/05/21  Yes Love, Evlyn Kanner, PA-C  RIVAROXABAN Carlena Hurl) VTE STARTER PACK (15 & 20 MG) Follow package directions: Take one 15mg  tablet by mouth twice a day. On day 22, switch to one 20mg  tablet once a day. Take with food. 11/02/21  Yes Amin, , MD  rosuvastatin (CRESTOR) 5  MG tablet Take 1 tablet (5 mg total) by mouth daily. Patient taking differently: Take 5 mg by mouth every morning. 08/17/21  Yes Claiborne Rigg, NP  sulfamethoxazole-trimethoprim (BACTRIM DS) 800-160 MG tablet Take 1 tablet by mouth daily. 10/07/21  Yes Almon Hercules, MD  umeclidinium bromide (INCRUSE ELLIPTA) 62.5 MCG/ACT AEPB Inhale 1 puff into the lungs daily. 10/07/21  Yes Almon Hercules, MD  aspirin 81 MG chewable tablet Chew 1 tablet (81 mg total) by mouth  daily. Patient not taking: Reported on 11/09/2021 08/05/21   Love, Evlyn Kanner, PA-C  ethambutol (MYAMBUTOL) 400 MG tablet Take 2 tablets by mouth daily. 10/07/21   Almon Hercules, MD  senna-docusate (SENOKOT-S) 8.6-50 MG tablet Take 1 tablet by mouth 2 (two) times daily. Patient not taking: Reported on 10/31/2021 08/05/21   Jacquelynn Cree, PA-C      Allergies    Lidocaine    Review of Systems   Review of Systems  Respiratory:  Positive for shortness of breath.   Neurological:  Positive for weakness.   Physical Exam Updated Vital Signs BP 116/90   Pulse (!) 139   Temp 98.2 F (36.8 C) (Oral)   Resp (!) 29   SpO2 95%  Physical Exam Vitals and nursing note reviewed.  Constitutional:      Appearance: He is well-developed.  HENT:     Head: Normocephalic and atraumatic.  Eyes:     Pupils: Pupils are equal, round, and reactive to light.  Neck:     Vascular: No JVD.  Cardiovascular:     Rate and Rhythm: Regular rhythm. Tachycardia present.     Heart sounds: No murmur heard.   No friction rub. No gallop.  Pulmonary:     Effort: No respiratory distress.     Breath sounds: No wheezing.  Abdominal:     General: There is no distension.     Tenderness: There is no abdominal tenderness. There is no guarding or rebound.  Musculoskeletal:        General: Normal range of motion.     Cervical back: Normal range of motion and neck supple.  Skin:    Coloration: Skin is not pale.     Findings: No rash.  Neurological:     Mental Status: He is alert and oriented to person, place, and time.     Comments: Right-sided facial droop  Butterfly like rash to the face  Psychiatric:        Behavior: Behavior normal.    ED Results / Procedures / Treatments   Labs (all labs ordered are listed, but only abnormal results are displayed) Labs Reviewed  URINALYSIS, ROUTINE W REFLEX MICROSCOPIC - Abnormal; Notable for the following components:      Result Value   Protein, ur 30 (*)    All other  components within normal limits  I-STAT CHEM 8, ED - Abnormal; Notable for the following components:   Sodium 131 (*)    Potassium 6.3 (*)    BUN 25 (*)    TCO2 20 (*)    Hemoglobin 11.6 (*)    HCT 34.0 (*)    All other components within normal limits  CULTURE, BLOOD (ROUTINE X 2)  CULTURE, BLOOD (ROUTINE X 2)  URINE CULTURE  SARS CORONAVIRUS 2 BY RT PCR  BASIC METABOLIC PANEL  CBC  LACTIC ACID, PLASMA  LACTIC ACID, PLASMA  PROTIME-INR  APTT  HEPATIC FUNCTION PANEL  DIFFERENTIAL  CBG MONITORING, ED    EKG  EKG Interpretation  Date/Time:  Wednesday Nov 09 2021 15:02:02 EDT Ventricular Rate:  136 PR Interval:  69 QRS Duration: 82 QT Interval:  290 QTC Calculation: 437 R Axis:   69 Text Interpretation: Sinus tachycardia Borderline T abnormalities, diffuse leads Since last tracing rate faster Otherwise no significant change Confirmed by Melene Plan [id number]) on 11/09/2021 3:06:44 PM  Radiology DG Chest Port 1 View  Result Date: 11/09/2021 CLINICAL DATA:  Shortness of breath and weakness.  Poor appetite. EXAM: PORTABLE CHEST 1 VIEW COMPARISON:  Multiple exams, including CTA chest 10/29/2021 FINDINGS: 3.5 by 1.6 cm right upper lobe nodular density. This is roughly similar to prior. Indistinct nodularity in the lingula. The smaller left upper lobe pulmonary nodule shown on chest CT is not readily apparent. Emphysema. Heart size is within normal limits. Indistinct AP window compatible with previously demonstrated adenopathy. No blunting of the costophrenic angles. IMPRESSION: 1. Stable appearance of right upper lobe and lingular densities. Lung cancer is not excluded. 2. Effacement of the AP window compatible with known adenopathy in this vicinity. 3.  Emphysema (ICD10-J43.9).  / Electronically Signed   By: Gaylyn Rong M.D.   On: 11/09/2021 14:51    Procedures Procedures    Medications Ordered in ED Medications  bictegravir-emtricitabine-tenofovir AF (BIKTARVY) 50-200-25 MG  per tablet 1 tablet (has no administration in time range)  lactated ringers bolus 2,000 mL (2,000 mLs Intravenous New Bag/Given 11/09/21 1547)    ED Course/ Medical Decision Making/ A&P                           Medical Decision Making Amount and/or Complexity of Data Reviewed Labs: ordered. Radiology: ordered.  Risk Prescription drug management.   46 yo M with chief complaints of cough and feels like he is dehydrated.  Has been going on for a few days now.  Patient unfortunately has a history of AIDS and has had to come to the hospital multiple times recently for opportunistic infections.  The patient is quite tachycardic and tachypneic on arrival.  We will give a bolus of IV fluids chest x-ray blood work.  Patient's i-STAT with mild hyperkalemia.  No EKG changes.  Formal metabolic panel.  UA without obvious infection chest x-ray independently turbid by me with a persistent right upper lobe infiltrate.  Awaiting the rest of the blood work.  If no obvious source of the patient's symptoms is found then likely would consider CT.  With him having AIDS likely will need admission.  Patient care was signed out to Dr. Effie Shy, please see his note for further details care in the ED.  The patients results and plan were reviewed and discussed.   Any x-rays performed were independently reviewed by myself.   Differential diagnosis were considered with the presenting HPI.  Medications  bictegravir-emtricitabine-tenofovir AF (BIKTARVY) 50-200-25 MG per tablet 1 tablet (has no administration in time range)  lactated ringers bolus 2,000 mL (2,000 mLs Intravenous New Bag/Given 11/09/21 1547)    Vitals:   11/09/21 1422 11/09/21 1500 11/09/21 1530 11/09/21 1600  BP: (!) 152/129 109/89 112/81 116/90  Pulse: (!) 140 (!) 135 (!) 135 (!) 139  Resp: (!) 22 (!) 32 (!) 26 (!) 29  Temp: 98.2 F (36.8 C)     TempSrc: Oral     SpO2: 98% 96% 97% 95%    Final diagnoses:  Weakness  Dehydration             Final Clinical  Impression(s) / ED Diagnoses Final diagnoses:  Weakness  Dehydration    Rx / DC Orders ED Discharge Orders     None         Melene PlanFloyd, Hannah Crill, DO 11/09/21 1616

## 2021-11-09 NOTE — ED Notes (Signed)
I made MD Adela Lank aware of the critical I Stat Chem 8.

## 2021-11-10 DIAGNOSIS — R64 Cachexia: Secondary | ICD-10-CM | POA: Diagnosis present

## 2021-11-10 DIAGNOSIS — I2510 Atherosclerotic heart disease of native coronary artery without angina pectoris: Secondary | ICD-10-CM | POA: Diagnosis present

## 2021-11-10 DIAGNOSIS — F329 Major depressive disorder, single episode, unspecified: Secondary | ICD-10-CM

## 2021-11-10 DIAGNOSIS — R590 Localized enlarged lymph nodes: Secondary | ICD-10-CM | POA: Diagnosis present

## 2021-11-10 DIAGNOSIS — Z87891 Personal history of nicotine dependence: Secondary | ICD-10-CM | POA: Diagnosis not present

## 2021-11-10 DIAGNOSIS — R7989 Other specified abnormal findings of blood chemistry: Secondary | ICD-10-CM | POA: Diagnosis not present

## 2021-11-10 DIAGNOSIS — L0211 Cutaneous abscess of neck: Secondary | ICD-10-CM | POA: Diagnosis not present

## 2021-11-10 DIAGNOSIS — Z20822 Contact with and (suspected) exposure to covid-19: Secondary | ICD-10-CM | POA: Diagnosis present

## 2021-11-10 DIAGNOSIS — R918 Other nonspecific abnormal finding of lung field: Secondary | ICD-10-CM | POA: Diagnosis not present

## 2021-11-10 DIAGNOSIS — F332 Major depressive disorder, recurrent severe without psychotic features: Secondary | ICD-10-CM | POA: Diagnosis present

## 2021-11-10 DIAGNOSIS — J439 Emphysema, unspecified: Secondary | ICD-10-CM | POA: Diagnosis present

## 2021-11-10 DIAGNOSIS — A812 Progressive multifocal leukoencephalopathy: Secondary | ICD-10-CM | POA: Diagnosis present

## 2021-11-10 DIAGNOSIS — D649 Anemia, unspecified: Secondary | ICD-10-CM | POA: Diagnosis present

## 2021-11-10 DIAGNOSIS — B3781 Candidal esophagitis: Secondary | ICD-10-CM | POA: Diagnosis not present

## 2021-11-10 DIAGNOSIS — B20 Human immunodeficiency virus [HIV] disease: Secondary | ICD-10-CM

## 2021-11-10 DIAGNOSIS — R591 Generalized enlarged lymph nodes: Secondary | ICD-10-CM | POA: Diagnosis not present

## 2021-11-10 DIAGNOSIS — I69351 Hemiplegia and hemiparesis following cerebral infarction affecting right dominant side: Secondary | ICD-10-CM | POA: Diagnosis not present

## 2021-11-10 DIAGNOSIS — A310 Pulmonary mycobacterial infection: Secondary | ICD-10-CM | POA: Diagnosis not present

## 2021-11-10 DIAGNOSIS — I10 Essential (primary) hypertension: Secondary | ICD-10-CM | POA: Diagnosis not present

## 2021-11-10 DIAGNOSIS — E872 Acidosis, unspecified: Secondary | ICD-10-CM | POA: Diagnosis present

## 2021-11-10 DIAGNOSIS — R509 Fever, unspecified: Secondary | ICD-10-CM | POA: Diagnosis not present

## 2021-11-10 DIAGNOSIS — J069 Acute upper respiratory infection, unspecified: Secondary | ICD-10-CM | POA: Diagnosis not present

## 2021-11-10 DIAGNOSIS — J9811 Atelectasis: Secondary | ICD-10-CM | POA: Diagnosis present

## 2021-11-10 DIAGNOSIS — I2699 Other pulmonary embolism without acute cor pulmonale: Secondary | ICD-10-CM | POA: Diagnosis not present

## 2021-11-10 DIAGNOSIS — R6510 Systemic inflammatory response syndrome (SIRS) of non-infectious origin without acute organ dysfunction: Secondary | ICD-10-CM | POA: Diagnosis not present

## 2021-11-10 DIAGNOSIS — E860 Dehydration: Secondary | ICD-10-CM | POA: Diagnosis present

## 2021-11-10 DIAGNOSIS — J189 Pneumonia, unspecified organism: Secondary | ICD-10-CM | POA: Diagnosis present

## 2021-11-10 DIAGNOSIS — K6812 Psoas muscle abscess: Secondary | ICD-10-CM | POA: Diagnosis not present

## 2021-11-10 DIAGNOSIS — E871 Hypo-osmolality and hyponatremia: Secondary | ICD-10-CM | POA: Diagnosis present

## 2021-11-10 DIAGNOSIS — N179 Acute kidney failure, unspecified: Secondary | ICD-10-CM

## 2021-11-10 DIAGNOSIS — A312 Disseminated mycobacterium avium-intracellulare complex (DMAC): Secondary | ICD-10-CM | POA: Diagnosis present

## 2021-11-10 DIAGNOSIS — Z8673 Personal history of transient ischemic attack (TIA), and cerebral infarction without residual deficits: Secondary | ICD-10-CM | POA: Diagnosis not present

## 2021-11-10 DIAGNOSIS — G8929 Other chronic pain: Secondary | ICD-10-CM | POA: Diagnosis not present

## 2021-11-10 DIAGNOSIS — Z515 Encounter for palliative care: Secondary | ICD-10-CM | POA: Diagnosis not present

## 2021-11-10 DIAGNOSIS — I700 Atherosclerosis of aorta: Secondary | ICD-10-CM | POA: Diagnosis present

## 2021-11-10 DIAGNOSIS — F1410 Cocaine abuse, uncomplicated: Secondary | ICD-10-CM | POA: Diagnosis present

## 2021-11-10 DIAGNOSIS — Z7189 Other specified counseling: Secondary | ICD-10-CM | POA: Diagnosis not present

## 2021-11-10 DIAGNOSIS — J449 Chronic obstructive pulmonary disease, unspecified: Secondary | ICD-10-CM | POA: Diagnosis not present

## 2021-11-10 DIAGNOSIS — R531 Weakness: Secondary | ICD-10-CM | POA: Diagnosis not present

## 2021-11-10 DIAGNOSIS — E43 Unspecified severe protein-calorie malnutrition: Secondary | ICD-10-CM | POA: Diagnosis present

## 2021-11-10 DIAGNOSIS — K219 Gastro-esophageal reflux disease without esophagitis: Secondary | ICD-10-CM | POA: Diagnosis present

## 2021-11-10 LAB — URINE CULTURE: Culture: <10000 — AB

## 2021-11-10 LAB — COMPREHENSIVE METABOLIC PANEL
ALT: 90 U/L — ABNORMAL HIGH (ref 0–44)
AST: 67 U/L — ABNORMAL HIGH (ref 15–41)
Albumin: 3.1 g/dL — ABNORMAL LOW (ref 3.5–5.0)
Alkaline Phosphatase: 285 U/L — ABNORMAL HIGH (ref 38–126)
Anion gap: 11 (ref 5–15)
BUN: 13 mg/dL (ref 6–20)
CO2: 17 mmol/L — ABNORMAL LOW (ref 22–32)
Calcium: 10.4 mg/dL — ABNORMAL HIGH (ref 8.9–10.3)
Chloride: 108 mmol/L (ref 98–111)
Creatinine, Ser: 1.05 mg/dL (ref 0.61–1.24)
GFR, Estimated: >60 mL/min (ref >60)
Glucose, Bld: 104 mg/dL — ABNORMAL HIGH (ref 70–99)
Potassium: 4.2 mmol/L (ref 3.5–5.1)
Sodium: 136 mmol/L (ref 135–145)
Total Bilirubin: 0.8 mg/dL (ref 0.3–1.2)
Total Protein: 7.8 g/dL (ref 6.5–8.1)

## 2021-11-10 LAB — CBC
HCT: 31.8 % — ABNORMAL LOW (ref 39.0–52.0)
Hemoglobin: 10 g/dL — ABNORMAL LOW (ref 13.0–17.0)
MCH: 27.9 pg (ref 26.0–34.0)
MCHC: 31.4 g/dL (ref 30.0–36.0)
MCV: 88.8 fL (ref 80.0–100.0)
Platelets: 358 10*3/uL (ref 150–400)
RBC: 3.58 MIL/uL — ABNORMAL LOW (ref 4.22–5.81)
RDW: 16.2 % — ABNORMAL HIGH (ref 11.5–15.5)
WBC: 6.6 10*3/uL (ref 4.0–10.5)
nRBC: 0 % (ref 0.0–0.2)

## 2021-11-10 LAB — PROCALCITONIN: Procalcitonin: 0.46 ng/mL

## 2021-11-10 MED ORDER — LACTATED RINGERS IV BOLUS
500.0000 mL | Freq: Once | INTRAVENOUS | Status: AC
  Administered 2021-11-10: 500 mL via INTRAVENOUS

## 2021-11-10 MED ORDER — SODIUM CHLORIDE 0.9 % IV SOLN
2.0000 g | INTRAVENOUS | Status: DC
  Administered 2021-11-10 – 2021-11-11 (×2): 2 g via INTRAVENOUS
  Filled 2021-11-10 (×2): qty 20

## 2021-11-10 MED ORDER — KETOROLAC TROMETHAMINE 15 MG/ML IJ SOLN
15.0000 mg | Freq: Once | INTRAMUSCULAR | Status: AC
  Administered 2021-11-10: 15 mg via INTRAVENOUS
  Filled 2021-11-10: qty 1

## 2021-11-10 MED ORDER — METHOCARBAMOL 500 MG PO TABS
750.0000 mg | ORAL_TABLET | Freq: Once | ORAL | Status: AC
  Administered 2021-11-10: 750 mg via ORAL
  Filled 2021-11-10: qty 2

## 2021-11-10 MED ORDER — SODIUM CHLORIDE 0.9 % IV SOLN
500.0000 mg | INTRAVENOUS | Status: DC
  Administered 2021-11-10: 500 mg via INTRAVENOUS
  Filled 2021-11-10: qty 5

## 2021-11-10 MED ORDER — B COMPLEX-C PO TABS
1.0000 | ORAL_TABLET | Freq: Every day | ORAL | Status: DC
  Administered 2021-11-10 – 2021-12-06 (×24): 1 via ORAL
  Filled 2021-11-10 (×21): qty 1

## 2021-11-10 MED ORDER — ENSURE ENLIVE PO LIQD
237.0000 mL | Freq: Two times a day (BID) | ORAL | Status: DC
  Administered 2021-11-10 – 2021-11-16 (×11): 237 mL via ORAL

## 2021-11-10 NOTE — Progress Notes (Signed)
Progress Note   Patient: Kurt Foley GHW:[id number] DOB: 1976/03/21 DOA: 11/09/2021     0 DOS: the patient was seen and examined on 11/10/2021   Brief hospital course: 46 y.o. male with medical history significant of substance use, right upper lobe mass, HIV, PML, MAI, emphysema, CVA, PE, depression presenting with weakness and shortness of breath.  Patient recently admitted from 5/20 until 5/24 with shortness of breath which is multifactorial.  Noted to have DVT and PE with right heart strain but no right heart failure started discharged on Xarelto, also noted to have a right upper lobe mass due to be evaluated by bronchoscopy by pulmonology after discharge has not yet been done, also with history of MAI on ethambutol, also with HIV/AIDS with CD4 of 46 on suppressive antibiotics,   Assessment and Plan: No notes have been filed under this hospital service. Service: Hospitalist SIRS > Patient presenting with shortness of breath and tachycardia with heart rate in the 130s and respiratory rate in the 30s qualifying for SIRS criteria.   > Urinalysis is without infection and chest x-ray shows stable changes from previous was treated for possible pneumonia during recent admission and is covered with prophylactic antibiotics of Bactrim as well as being on ethambutol for MAI. >  procalcitonin elevated at 0.5.  Pt febrile this AM -Will cont pt on rocephin and azithro to cover CAP   AKI > Creatinine elevated to 1.34 from baseline of 1.  Patient states he feels dehydrated. > Renal function improved with IVF   Pulmonary embolus > Diagnosed during recent admission.  - Continue home Xarelto - Reviewed repeat CT chest. Findings of improved PE   Right upper lobe mass > Noted during recent admission, somewhat suspicious for cancer due to adenopathy as well.  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  Currently on prophylactic Bactrim.  Is also on ethambutol for MAI. Hold for now as pt to start tx for CAP - Hold home Bactrim and Azithro for MAI - Continue home Biktarvy  Substance use > History of polysubstance use including marijuana, cocaine, alcohol - Continue to monitor  Depression - Continue home Seroquel and mirtazapine  GERD - Continue home PPI   Transaminitis > Chronic transaminitis currently stable with AST 77, ALT 115, alk phos 349. - Trend CMP        Subjective: Reports feeling better  Physical Exam: Vitals:   11/10/21 0917 11/10/21 0949 11/10/21 1256 11/10/21 1516  BP: 112/77 115/72 116/87 120/82  Pulse: (!) 122 (!) 110 (!) 110 (!) 108  Resp: $Remo'20  20 20  'WVmmS$ Temp: 98.5 F (36.9 C)  (!) 101.1 F (38.4 C) 99.7 F (37.6 C)  TempSrc: Oral  Oral Oral  SpO2: 95%  95% 95%  Weight:      Height:       General exam: Awake, laying in bed, in nad Respiratory system: Normal respiratory effort, no wheezing Cardiovascular system: regular rate, s1, s2 Gastrointestinal system: Soft, nondistended, positive BS Central nervous system: CN2-12 grossly intact, strength intact Extremities: Perfused, no clubbing Skin: Normal skin turgor, no notable skin lesions seen Psychiatry: Mood normal // no visual hallucinations   Data Reviewed:  Labs reviewed:  0.46   Family Communication: Pt in room, family not at bedside  Disposition: Status is: Observation The patient will require care spanning > 2 midnights and should be moved to inpatient because: Severity of illness  Planned Discharge Destination: Home    Author: Marylu Lund, MD 11/10/2021 3:28 PM  For on call review [email or web address].

## 2021-11-10 NOTE — Progress Notes (Signed)
Initial Nutrition Assessment  DOCUMENTATION CODES:   Not applicable  INTERVENTION:  - will order Ensure Plus High Protein BID, each supplement provides 350 kcal and 20 grams of protein. - will order 1 tablet vitamin B complex with vitamin C/day. - monitor for possible need to check micronutrient labs.    NUTRITION DIAGNOSIS:   Increased nutrient needs related to acute illness, chronic illness as evidenced by estimated needs.  GOAL:   Patient will meet greater than or equal to 90% of their needs  MONITOR:   PO intake, Supplement acceptance, Labs, Weight trends  REASON FOR ASSESSMENT:   Malnutrition Screening Tool  ASSESSMENT:   46 y.o. male with medical history of substance use, RUL mass pending bronch by Pulmonology to evaluate, HIV/AIDS, PML, MAI, emphysema, CVA with residual R-sided hemiparesis, and depression. He was admitted 5/20-5/24 due to shortness of breath and at that time was noted at that time to have DVT and PE with R-sided heart strain. He returned to the ED due to ongoing shortness of breath, weakness,  cough which is sometimes painful, sensation of his heart racing, and decreased urine output with concern for dehydration. Patient was admitted for SIRS.  Patient laying in bed with no visitors present at the time of RD visit. Patient very drowsy so visit slightly shortened d/t this. Patient was seen by other [place] RDs during hospitalization in 07/2021.  Patient is currently ordered Megace. He shares that he began this outpatient (unsure of when) and that it has been helpful for him. He shares that appetite has now been good with taking Megace and that he was able to eat 100% of breakfast this AM (936 kcal and 12 grams protein).  He denies any chewing or swallowing difficulties or oral or esophageal pain with breakfast this AM. He confirms R-sided hemiparesis and shares that he is able to self-feed without issue with L hand.  Patient shares that redness and  slight swelling to facial area is related to sensitive skin and allergic reaction to some brands of lotion. Will continue to monitor.   Weight yesterday was 150 lb and weight on 10/19/21 was 151 lb. Weight has been trending up over the past 4 months.    Labs reviewed; Ca: 10.4 mg/dl, Alk Phos elevated but down from 5/31, LFTs elevated but down from 5/31.  Medications reviewed; 400 mg megace/day, 15 mg remeron/day, 40 mg oral protonix/day.    NUTRITION - FOCUSED PHYSICAL EXAM:  Flowsheet Row Most Recent Value  Orbital Region No depletion  Upper Arm Region No depletion  Thoracic and Lumbar Region Unable to assess  Buccal Region No depletion  Temple Region No depletion  Clavicle Bone Region No depletion  Clavicle and Acromion Bone Region No depletion  Scapular Bone Region Unable to assess  Dorsal Hand No depletion  Patellar Region No depletion  Anterior Thigh Region No depletion  Posterior Calf Region No depletion  Edema (RD Assessment) None  Hair Reviewed  Eyes Reviewed  Mouth Reviewed  Skin Reviewed  [patchy redness around temples, nasolabial folds, and over R eyelid]  Nails Reviewed       Diet Order:   Diet Order             Diet regular Room service appropriate? Yes; Fluid consistency: Thin  Diet effective now                   EDUCATION NEEDS:   No education needs have been identified at this time  Skin:  Skin Assessment: Reviewed RN Assessment  Last BM:  PTA/unknown  Height:   Ht Readings from Last 1 Encounters:  11/09/21 $RemoveB'5\' 6"'oNAPsJYr$  (1.676 m)    Weight:   Wt Readings from Last 1 Encounters:  11/09/21 67.9 kg     BMI:  Body mass index is 24.16 kg/m.  Estimated Nutritional Needs:  Kcal:  2050-2250 kcal Protein:  102-115 grams Fluid:  >/= 2.2 L/day     Jarome Matin, MS, RD, LDN Registered Dietitian II Inpatient Clinical Nutrition RD pager # and on-call/weekend pager # available in [facility]

## 2021-11-10 NOTE — Hospital Course (Addendum)
46 year old with advanced AIDS, disseminated MAC substance abuse, depression, prior CVA with residual right-sided weakness, pulmonary embolism on Xarelto admitted for cough and palpitations.  Repeat CT showed abnormal lung parenchyma.  He was seen by pulmonary, cultures remain negative.  Underwent bronchoscopy with EBUS on 6/5 with subcarinal lymph node sampling and brushing with BAL of right upper lobe mass.  Brushings remain negative, IR consulted for CT-guided biopsy.  Patient off-and-on spiking fever, discussed with ID and this is likely due to MAC.  Patient has also been seen by palliative care service, wishes to be full code. Unforturnately continues to spike fevers daily leading to tachycardia.   ID is now changing antibiotics and trying IV Zosyn.  Continues to have temperatures and tachycardia along with some back pain but fever seems improved this morning and ID recommends continuing current antibiotics and regimen at this time.  Palliative care is increasing his blood but now and PT OT recommending outpatient PT.  Given his recurrent fevers ID is now checking a CT of the chest abdomen pelvis to see if there is anything new that they could consider; otherwise that they are going to continue his current plan however they have changed his HIV medication from [place] to [place].  He has not changed his morphine p.o. to oxycodone.  Given his poor appetite we will start him on D5W half-normal saline at 75 MLS per hour and also given 500 mill bolus given his slight hypotension.  CT Scan of the Chest/Abd/Pelvis done and showed "Worsening nodal disease since recent imaging from May in April in the chest and abdomen though improved when compared to January. Findings may reflect either sequela of age related infection or could potentially be related to neoplasm/lymphoproliferative disorder. Would suggest correlation with PET imaging to select a site for sampling. This could also be utilized to assess  pulmonary findings that have raise concern on recent imaging. More  masslike appearance of RIGHT upper lobe changes and LEFT upper lobe nodules as discussed above, consideration for  pulmonary neoplasm as discussed on recent imaging. Signs of pulmonary emphysema. Three-vessel coronary artery disease.  Low-attenuation cardiac blood pool compatible with anemia.  Stranding about jejunal loops in the upper abdomen is of uncertain significance but is associated with increasing nodal disease, perhaps related to inflammation or lymphatic congestion. Attention on follow-up.  Bilateral perinephric stranding which was not evident on most recent abdominal imaging. Correlation with urinalysis is suggested. Aortic Atherosclerosis (ICD10-I70.0) and Emphysema."  ID recommends continuing current plan of care transitioning to oral Augmentin for 5 days at discharge.  ID feels that they do not expect any meaningful recovery but patient wishes to continue his current treatments and wants to consider physical rehabilitation.  We will consult PT OT for further evaluation recommendations

## 2021-11-10 NOTE — Progress Notes (Signed)
  Transition of Care [facility]) Screening Note   Patient Details  Name: Kurt Foley Date of Birth: [birth year]   Transition of Care [facility]) CM/SW Contact:    Lanier Clam, RN Phone Number: 11/10/2021, 1:18 PM    Transition of Care Department [facility]) has reviewed patient and no TOC needs have been identified at this time. We will continue to monitor patient advancement through interdisciplinary progression rounds. If new patient transition needs arise, please place a TOC consult.

## 2021-11-11 ENCOUNTER — Ambulatory Visit: Payer: Medicaid Other | Admitting: Occupational Therapy

## 2021-11-11 ENCOUNTER — Ambulatory Visit: Payer: Medicaid Other | Admitting: Physical Therapy

## 2021-11-11 DIAGNOSIS — J189 Pneumonia, unspecified organism: Secondary | ICD-10-CM | POA: Diagnosis not present

## 2021-11-11 DIAGNOSIS — N179 Acute kidney failure, unspecified: Secondary | ICD-10-CM | POA: Diagnosis not present

## 2021-11-11 DIAGNOSIS — R7989 Other specified abnormal findings of blood chemistry: Secondary | ICD-10-CM

## 2021-11-11 DIAGNOSIS — R590 Localized enlarged lymph nodes: Secondary | ICD-10-CM | POA: Diagnosis not present

## 2021-11-11 DIAGNOSIS — F1490 Cocaine use, unspecified, uncomplicated: Secondary | ICD-10-CM

## 2021-11-11 DIAGNOSIS — F1290 Cannabis use, unspecified, uncomplicated: Secondary | ICD-10-CM

## 2021-11-11 DIAGNOSIS — B20 Human immunodeficiency virus [HIV] disease: Secondary | ICD-10-CM | POA: Diagnosis not present

## 2021-11-11 DIAGNOSIS — J439 Emphysema, unspecified: Secondary | ICD-10-CM | POA: Diagnosis not present

## 2021-11-11 DIAGNOSIS — A312 Disseminated mycobacterium avium-intracellulare complex (DMAC): Secondary | ICD-10-CM

## 2021-11-11 DIAGNOSIS — R6510 Systemic inflammatory response syndrome (SIRS) of non-infectious origin without acute organ dysfunction: Secondary | ICD-10-CM | POA: Diagnosis not present

## 2021-11-11 DIAGNOSIS — I2699 Other pulmonary embolism without acute cor pulmonale: Secondary | ICD-10-CM | POA: Diagnosis not present

## 2021-11-11 DIAGNOSIS — Z8673 Personal history of transient ischemic attack (TIA), and cerebral infarction without residual deficits: Secondary | ICD-10-CM | POA: Diagnosis not present

## 2021-11-11 LAB — COMPREHENSIVE METABOLIC PANEL
ALT: 77 U/L — ABNORMAL HIGH (ref 0–44)
AST: 59 U/L — ABNORMAL HIGH (ref 15–41)
Albumin: 2.8 g/dL — ABNORMAL LOW (ref 3.5–5.0)
Alkaline Phosphatase: 280 U/L — ABNORMAL HIGH (ref 38–126)
Anion gap: 9 (ref 5–15)
BUN: 13 mg/dL (ref 6–20)
CO2: 21 mmol/L — ABNORMAL LOW (ref 22–32)
Calcium: 9.7 mg/dL (ref 8.9–10.3)
Chloride: 109 mmol/L (ref 98–111)
Creatinine, Ser: 1.1 mg/dL (ref 0.61–1.24)
GFR, Estimated: >60 mL/min (ref >60)
Glucose, Bld: 112 mg/dL — ABNORMAL HIGH (ref 70–99)
Potassium: 4.2 mmol/L (ref 3.5–5.1)
Sodium: 139 mmol/L (ref 135–145)
Total Bilirubin: 0.4 mg/dL (ref 0.3–1.2)
Total Protein: 7.2 g/dL (ref 6.5–8.1)

## 2021-11-11 LAB — CBC
HCT: 30.7 % — ABNORMAL LOW (ref 39.0–52.0)
Hemoglobin: 9.5 g/dL — ABNORMAL LOW (ref 13.0–17.0)
MCH: 28 pg (ref 26.0–34.0)
MCHC: 30.9 g/dL (ref 30.0–36.0)
MCV: 90.6 fL (ref 80.0–100.0)
Platelets: 326 10*3/uL (ref 150–400)
RBC: 3.39 MIL/uL — ABNORMAL LOW (ref 4.22–5.81)
RDW: 16.1 % — ABNORMAL HIGH (ref 11.5–15.5)
WBC: 5.9 10*3/uL (ref 4.0–10.5)
nRBC: 0 % (ref 0.0–0.2)

## 2021-11-11 LAB — HEPARIN LEVEL (UNFRACTIONATED): Heparin Unfractionated: >1.1 IU/mL — ABNORMAL HIGH (ref 0.30–0.70)

## 2021-11-11 LAB — PROCALCITONIN: Procalcitonin: 0.36 ng/mL

## 2021-11-11 MED ORDER — METHOCARBAMOL 500 MG PO TABS
500.0000 mg | ORAL_TABLET | Freq: Once | ORAL | Status: AC
  Administered 2021-11-11: 500 mg via ORAL
  Filled 2021-11-11: qty 1

## 2021-11-11 MED ORDER — KETOROLAC TROMETHAMINE 15 MG/ML IJ SOLN
15.0000 mg | Freq: Once | INTRAMUSCULAR | Status: AC
  Administered 2021-11-11: 15 mg via INTRAVENOUS
  Filled 2021-11-11: qty 1

## 2021-11-11 MED ORDER — HEPARIN (PORCINE) 25000 UT/250ML-% IV SOLN
1100.0000 [IU]/h | INTRAVENOUS | Status: DC
  Administered 2021-11-11: 1050 [IU]/h via INTRAVENOUS
  Administered 2021-11-12: 1100 [IU]/h via INTRAVENOUS
  Filled 2021-11-11 (×2): qty 250

## 2021-11-11 MED ORDER — KETOCONAZOLE 2 % EX CREA
TOPICAL_CREAM | Freq: Two times a day (BID) | CUTANEOUS | Status: DC
  Administered 2021-11-12 – 2021-12-01 (×4): 1 via TOPICAL
  Filled 2021-11-11 (×2): qty 15

## 2021-11-11 MED ORDER — AZITHROMYCIN 250 MG PO TABS
500.0000 mg | ORAL_TABLET | Freq: Every day | ORAL | Status: DC
  Administered 2021-11-11 – 2021-12-06 (×26): 500 mg via ORAL
  Filled 2021-11-11 (×26): qty 2

## 2021-11-11 NOTE — Progress Notes (Addendum)
ANTICOAGULATION CONSULT NOTE - Initial Consult  Pharmacy Consult for Xarelto to IV heparin Indication: history of pulmonary embolism  Allergies  Allergen Reactions   Lidocaine Other (See Comments)    Large bruise at site of patch placement    Patient Measurements: Height: 5\' 6"  (167.6 cm) Weight: 67.9 kg (149 lb 11.1 oz) IBW/kg (Calculated) : 63.8 Heparin Dosing Weight: 67.9 kg  Vital Signs: Temp: 100.8 F (38.2 C) (06/02 1539) Temp Source: Oral (06/02 1539) BP: 109/66 (06/02 1539) Pulse Rate: 106 (06/02 1539)  Labs: Recent Labs    11/09/21 1541 11/09/21 1600 11/10/21 0502 11/11/21 0611  HGB 11.8* 11.6* 10.0* 9.5*  HCT 37.2* 34.0* 31.8* 30.7*  PLT 411*  --  358 326  APTT 55*  --   --   --   LABPROT 25.3*  --   --   --   INR 2.3*  --   --   --   CREATININE 1.34* 1.20 1.05 1.10    Estimated Creatinine Clearance: 74.9 mL/min (by C-G formula based on SCr of 1.1 mg/dL).   Medical History: Past Medical History:  Diagnosis Date   Depression    HIV infection (HCC)     Medications:  Scheduled:   azithromycin  500 mg Oral Daily   B-complex with vitamin C  1 tablet Oral Daily   bictegravir-emtricitabine-tenofovir AF  1 tablet Oral Daily   ethambutol  800 mg Oral Daily   feeding supplement  237 mL Oral BID BM   ketoconazole   Topical BID   megestrol  400 mg Oral q morning   metoprolol succinate  50 mg Oral q morning   mirtazapine  15 mg Oral QHS   mometasone-formoterol  2 puff Inhalation BID   pantoprazole  40 mg Oral QAC breakfast   QUEtiapine  200 mg Oral QHS   rosuvastatin  5 mg Oral q morning   sodium chloride flush  3 mL Intravenous Q12H   umeclidinium bromide  1 puff Inhalation Daily    Assessment: 46 yo male presenting with shortness of breath.  PMH includes submassive PE and + DVT diagnosed 10/29/21 on Xarelto 15mg  PO BID and mediastinal adenopathy.  PCCM is consulted to perform bronchoscopy tentatively planned on 6/5 or 6/6.  Pharmacy has been  consulted to transition Xarelto to IV heparin in preparation for bronchoscopy. Last dose of Xarelto was 6/2 at 0759.    Baseline coag labs drawn 5/31: aPTT 55, INR 2.3, Hgb 11.8  Goal of Therapy:  Heparin level 0.3-0.7 units/ml aPTT 66-102 seconds Monitor platelets by anticoagulation protocol: Yes Heparin level is falsely elevated at > 1.1 due to recent Xarelto admin- plan to dose IV heparin using aPTT's until level is correlating with heparin level.    Plan:  STAT heparin level No heparin bolus given recent Xarelto administration Begin heparin infusion at 1050 units/hr @ ~1700 this evening (when next dose of Xarelto would be due) Check aPTT 6 hours after heparin initiation Monitor daily heparin level and CBC  8/2, PharmD 11/11/2021 4:41 PM

## 2021-11-11 NOTE — Consult Note (Addendum)
[facility]    Date of Admission:  11/09/2021     Reason for Consult: fever/aids    Referring Provider: Wyline Copas   Abx: 6/1-c azith 6/1-c ceftriaxone 6/1-c ethambutol 5/31-c biktarvy  5/31 vanc/piptazo   Supposed to have been on azith/ethambutol since 06/2021 but didn't appear to fill after 07/2021        Assessment: fever Aids MAC infection LUL mass PML  Hx cva right sided weakness Recent bilateral submassive PE Lft elevation  #fever #lft elevation - improved -A little far out from ART initiation to have IRIS, and also far out since PE diagnosis to have fever related to this -see below regarding the LUL mass and other OI or malignancy in ddx. On bactrim 1 tablet ds daily prophylaxis but there has been breakthrough reported in the transplant population  -this doesn't appear to be TB -I do not see conclusive supportive evidence for drug fever  -He appears to have missed 2 months with his MAC treatment just prior to admission. There is also a small possibility that he could have developed resistance to one of the antibiotics for MAC given he only has been on double agent treatment without rifampicin -Previous recent ID workup 5/31 bcx negative to date 5/31 urine cx <10k insignificant growth; no urinary sx 4/27 hepatitis panel negative 07/2021 crypto ag negative; blasto ag negative   Suspect nonspecific viral infection (lft eletion now improving -- no localizing sign outside of lung mass/nodules) but other consideration above Would consider adding urine histo ag Would also check mycobacterial blood cx given he might not have been taking his azith/ethambutol His lung mass will need to be looked at; if fever ongoing is prohibitive in discharge would do here in hospital if possible I dont feel strongly he has pna - he was treated recently   #rash Suspect seborrheic dermatitis Doesn't appear to be drug allergy Recent rpr negative --  could repeat although he denies new sexual encounter  -nizoral cream -rpr/fta     #LUL mass #bilateral pulm nodules #mediastinal/abd LAD Nocardia/rhodococcus pna or other OI infection possible; already on treatment for MAC and it rather looks atypical for MAC We don't often see IFI (aspergillus/mucor in aids unless other immunodepression as well such as neutropenia or severe diabetes) Lymphoma/malignancy a consideration as well As he still have fever, I think he should have this sampled   See above I would get tissue biopsy of mass or lymph node and send for path/culture.     #MAC -dx'ed outside facility 02/2021 sputum and blood cx positive; started on azith/ethambutol which were stopped 04/21/2021; resumed 06/16/2021  -appears he might have missed the last 2 months of abx  Mycobacterial blood cx Cotninue azith/ethambutol orally  -As above concerned for potential resistance development Would send repeat mycobacterial blood culture  #pml -06/2021 admission [facility] impaired gait; transferred from [facility] to [facility] for concerning brain mri. Presumed pml based on imaging mri brain; csf pcr jc virus negative previously   #hiv/aids #other OI Hiv virologically controlled since 03/2021; reengaged with care/ART since 02/2021 10/19/2021 cd4 46 improved since 05/2021 Since he has been compliant with his ART and no recent pjp prior to biktarvy reinitiation I am ok holding the bactrim prophy for now   Plan: Urine histo ag Blood mycobacterial blood culture Rpr/fta Change azith to oral Stop ceftriaxone Continue ethambutol Continue biktarvy Start nizoral cream to face If ongoing fever and no other  source of infection would discuss with pulmonary about biopsy the left upper lobe and send for pathology, bacterial (aerobic/anaerobic), fungal, afb culture Discussed with primary team and nursing staff     I spent 75 minute reviewing data/chart, and coordinating  care and >50% direct face to face time providing counseling/discussing diagnostics/treatment plan with patient      ------------------------------------------------ Principal Problem:   SIRS (systemic inflammatory response syndrome) ([place]) Active Problems:   Human immunodeficiency virus (HIV) disease ([place])   Severe recurrent major depression without psychotic features ([place])   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[place])   Emphysema, unspecified ([place])   History of CVA with residual right hemiparesis   Insomnia   Abnormal LFTs   Mass of upper lobe of right lung   Pulmonary embolism ([place])   Pneumonia    HPI: Kurt Foley is a 46 y.o. male aids, cva left paresis, pml, mac on tx since 06/2021, lul pulm mass, admitted with a week of fever/malaise/dyspnea   He was admitted 5/20-24 for sob noted to have dvt/pe and right heart strain. Discharged on [place]. At that time noted to have RUL mass -- pulm consulted but deferred outpatient w/u  He was treated empirically for pna at that time  He said his sx didn't change and he came back for ongoing cough/dyspnea. No other sx. No headache, rash, n/v/diarrhea, joint pain, abd pain  He has gained considerable weight since I last saw him half a year ago. He follows dr Linus Salmons in clinic  Reviewed his meds with him He said he has been taking like 10 medication but didn't know which one is for what. It doesn't appear he fills azith/ethambutol since 2-3 months prior to this admission  In the ed: Patient sinus tach and tachypnic, not hypoxic though Slight azotemiacr 1.3. lft elevated Plt 411 Repeat chest ct showed stable lung mass/nodules/lymph node; no other airspace disease CAP tx restarted Biktarvy/ethambutol also started Bactrim hold  He is feeling about the same today   He also complains of a chronic rash on face. He said he was given some cream    Family History  Problem Relation Age of Onset   Rheum arthritis  Mother    Diabetes Mother    Hypertension Mother    Arthritis Mother     Social History   Tobacco Use   Smoking status: Former    Packs/day: 0.50    Years: 25.00    Pack years: 12.50    Types: Cigarettes   Smokeless tobacco: Never   Tobacco comments:    cutting back  Vaping Use   Vaping Use: Never used  Substance Use Topics   Alcohol use: Not Currently    Alcohol/week: 2.0 standard drinks    Types: 2 Standard drinks or equivalent per week    Comment: 3-4 days a week.Last drink: today   Drug use: Not Currently    Types: Marijuana, Cocaine    Comment: Once a week. Last used yesterday.  Cocaine last used today.     Allergies  Allergen Reactions   Lidocaine Other (See Comments)    Large bruise at site of patch placement    Review of Systems: ROS All Other ROS was negative, except mentioned above   Past Medical History:  Diagnosis Date   Depression    HIV infection ([place])        Scheduled Meds:  B-complex with vitamin C  1 tablet Oral Daily   bictegravir-emtricitabine-tenofovir AF  1 tablet Oral Daily  ethambutol  800 mg Oral Daily   feeding supplement  237 mL Oral BID BM   megestrol  400 mg Oral q morning   metoprolol succinate  50 mg Oral q morning   mirtazapine  15 mg Oral QHS   mometasone-formoterol  2 puff Inhalation BID   pantoprazole  40 mg Oral QAC breakfast   QUEtiapine  200 mg Oral QHS   Rivaroxaban  15 mg Oral BID WC   rosuvastatin  5 mg Oral q morning   sodium chloride flush  3 mL Intravenous Q12H   umeclidinium bromide  1 puff Inhalation Daily   Continuous Infusions:  azithromycin 500 mg (11/10/21 2227)   cefTRIAXone (ROCEPHIN)  IV 2 g (11/10/21 1515)   PRN Meds:.acetaminophen **OR** acetaminophen, albuterol, guaiFENesin-dextromethorphan, polyethylene glycol   OBJECTIVE: Blood pressure 99/78, pulse 99, temperature 98.9 F (37.2 C), temperature source Oral, resp. rate 16, height _0  (1.676 m), weight 67.9 kg, SpO2 99 %.  Physical  Exam  General/constitutional: no distress, pleasant, much more weight than I last seen a few months ago HEENT: Normocephalic, PER, Conj Clear, EOMI, Oropharynx clear Neck supple CV: rrr no mrg Lungs: clear to auscultation, normal respiratory effort Abd: Soft, Nontender Ext: no edema Skin: squamous peeling rash on face along beard and hair line Neuro: left sided paresis MSK: no peripheral joint swelling/tenderness/warmth; back spines nontender     Lab Results Lab Results  Component Value Date   WBC 5.9 11/11/2021   HGB 9.5 (L) 11/11/2021   HCT 30.7 (L) 11/11/2021   MCV 90.6 11/11/2021   PLT 326 11/11/2021    Lab Results  Component Value Date   CREATININE 1.10 11/11/2021   BUN 13 11/11/2021   NA 139 11/11/2021   K 4.2 11/11/2021   CL 109 11/11/2021   CO2 21 (L) 11/11/2021    Lab Results  Component Value Date   ALT 77 (H) 11/11/2021   AST 59 (H) 11/11/2021   ALKPHOS 280 (H) 11/11/2021   BILITOT 0.4 11/11/2021      Microbiology: Recent Results (from the past 240 hour(s))  Urine Culture     Status: Abnormal   Collection Time: 11/09/21  3:01 PM   Specimen: Urine, Clean Catch  Result Value Ref Range Status   Specimen Description   Final    URINE, CLEAN CATCH Performed at [facility], [place] [address], [place], [place] [address]    Special Requests   Final    NONE Performed at [facility], [place] [address], [place], [place] [address]    Culture (A)  Final    <10,000 COLONIES/mL INSIGNIFICANT GROWTH Performed at [facility] [address] [place], [place] [address]    Report Status 11/10/2021 FINAL  Final  SARS Coronavirus 2 by RT PCR (hospital order, performed in [facility] hospital lab) *cepheid single result test* Anterior Nasal Swab     Status: None   Collection Time: 11/09/21  3:01 PM   Specimen: Anterior Nasal Swab  Result Value Ref Range Status   SARS Coronavirus 2 by RT PCR NEGATIVE NEGATIVE Final     Comment: (NOTE) SARS-CoV-2 target nucleic acids are NOT DETECTED.  The SARS-CoV-2 RNA is generally detectable in upper and lower respiratory specimens during the acute phase of infection. The lowest concentration of SARS-CoV-2 viral copies this assay can detect is 250 copies / mL. A negative result does not preclude SARS-CoV-2 infection and should not be used as the sole basis for  treatment or other patient management decisions.  A negative result may occur with improper specimen collection / handling, submission of specimen other than nasopharyngeal swab, presence of viral mutation(s) within the areas targeted by this assay, and inadequate number of viral copies (<250 copies / mL). A negative result must be combined with clinical observations, patient history, and epidemiological information.  Fact Sheet for Patients:   https://www.patel.info/  Fact Sheet for Healthcare Providers: https://hall.com/  This test is not yet approved or  cleared by the [place] FDA and has been authorized for detection and/or diagnosis of SARS-CoV-2 by FDA under an Emergency Use Authorization (EUA).  This EUA will remain in effect (meaning this test can be used) for the duration of the COVID-19 declaration under Section 564(b)(1) of the Act, 21 U.S.C. section 360bbb-3(b)(1), unless the authorization is terminated or revoked sooner.  Performed at [facility], [place] [address] [place], [place] [address]   Blood Culture (routine x 2)     Status: None (Preliminary result)   Collection Time: 11/09/21  3:38 PM   Specimen: BLOOD  Result Value Ref Range Status   Specimen Description   Final    BLOOD RIGHT ANTECUBITAL Performed at [facility] [address], [place], [place] [address]    Special Requests   Final    BOTTLES DRAWN AEROBIC AND ANAEROBIC Blood Culture results may not be optimal due to an inadequate volume  of blood received in culture bottles Performed at [facility] [address] [place], [place] [address]    Culture   Final    NO GROWTH < 24 HOURS Performed at [facility] [address], [place], [place] [address]    Report Status PENDING  Incomplete  Blood Culture (routine x 2)     Status: None (Preliminary result)   Collection Time: 11/09/21  5:02 PM   Specimen: BLOOD  Result Value Ref Range Status   Specimen Description   Final    BLOOD SITE NOT SPECIFIED Performed at [facility] [address], [place], [place] [address]    Special Requests   Final    BOTTLES DRAWN AEROBIC AND ANAEROBIC Blood Culture results may not be optimal due to an inadequate volume of blood received in culture bottles Performed at [facility] [address] [place], [place] [address]    Culture   Final    NO GROWTH < 24 HOURS Performed at [facility] [address], [place],  [address]    Report Status PENDING  Incomplete     Serology:    Imaging: If present, new imagings (plain films, ct scans, and mri) have been personally visualized and interpreted; radiology reports have been reviewed. Decision making incorporated into the Impression / Recommendations.  5/31 chest ct with contrast 1. Decreased prominence of the right lower lobe pulmonary emboli, with apparent resolution of the remaining pulmonary emboli seen previously. Evaluation of the pulmonary vasculature is limited on this exam due to technique and timing of contrast bolus. 2. Left upper lobe nodules and right upper lobe masslike consolidation, underlying malignancy not excluded. Follow-up PET CT may be useful if the patient would be a therapy candidate should neoplasm be detected. 3. Stable mediastinal and hilar lymphadenopathy. Differential includes metastatic disease or primary lymphoproliferative disorder in this patient with history of  HIV. 4. Aortic Atherosclerosis    5/31 chest cta 1. Bilateral pulmonary artery emboli with CT evidence of  right heart straining (RV/LV Ratio = 1.4) consistent with at least submassive (intermediate risk) PE. The presence of right heart strain has been associated with an increased risk of morbidity and mortality. 2. Similar appearance of bilateral hilar and mediastinal soft tissue masses or adenopathy as well as bilateral upper lobe pulmonary nodules. 3. Coronary vascular calcification, advanced for the patient's age. 4. Aortic Atherosclerosis (ICD10-I70.0) and Emphysema    4/27 chest abd pelv ct 1. Respiratory motion degrades detail of lower lobe pulmonary arteries. No acute pulmonary embolus identified.   2. Emphysema (ICD10-J43.9) with progressed Mass-like peripheral Right Upper Lobe opacity since January. This is now more suspicious for Malignancy than unresolved Pneumonia. No pleural effusion.   3. But at the same time the previous widespread lymphadenopathy in the chest and abdomen has regressed - compatible with regression of an HIV related lymphoproliferative disorder or leukemia/lymphoma.   4. No other acute or inflammatory process in the chest, abdomen, or pelvis.   5. Calcified coronary artery atherosclerosis and Aortic Atherosclerosis   Jabier Mutton, [facility] [place] [phone] pager    11/11/2021, 10:12 AM

## 2021-11-11 NOTE — Consult Note (Signed)
NAME:  Kurt Foley, MRN:  [id number], DOB:  1975-10-08, LOS: 1 ADMISSION DATE:  11/09/2021, CONSULTATION DATE:  11/11/21 REFERRING MD:  Dr Wyline Copas, CHIEF COMPLAINT:  AID with lung mass, nodules, mediastinal adenopathy. Recent PE   History of Present Illness:  -46 year old patient with advanced AIDS.,  Substance abuse, depression disseminated MAC, prior CVA with residual right-sided weakness recent admission first on 10/06/2021 - 10/07/2021 with dry cough and fever.  Pulmonary embolism negative.  But had progressive masslike peripheral right upper lobe opacity subpleural.  Then again 10/29/2021 - 11/02/2021 with bilateral pulmonary embolism carpal mild treated with the systemic anticoagulation discharged on Xarelto.  Set up to see Dr. Leory Plowman Icard for evaluation of bronchoscopy mid June 2023 but readmitted 11/09/2021 with 2 to 3 days of painful cough palpitations decreased appetite.  Tachycardic.  Mild elevation lactic acid 2.6.  Infectious disease does not think this is a viral syndrome..  Repeat CT scan of the chest confirms the abnormal pulmonary parenchymal findings and mediastinal/hilar/subcarinal adenopathy.  History also reveals that he has missed 2 months of MAC treatment.  No dyspnea or fever.  Infectious disease requesting tissue sampling of the lung masses and also mediastinal adenopathy for pathology and culture.  Therefore pulmonary consulted.  Patient currently on Xarelto.  He is not NPO.  Previous recent ID workup 5/31 bcx negative to date 5/31 urine cx <10k insignificant growth; no urinary sx 4/27 hepatitis panel negative 07/2021 crypto ag negative; blasto ag negative CT chest 11/09/21  Narrative & Impression  CLINICAL DATA:  Short of breath, weakness, poor appetite for several days, lung mass, HIV   EXAM: CT CHEST WITH CONTRAST   TECHNIQUE: Multidetector CT imaging of the chest was performed during intravenous contrast administration.   RADIATION DOSE REDUCTION: This exam was  performed according to the departmental dose-optimization program which includes automated exposure control, adjustment of the mA and/or kV according to patient size and/or use of iterative reconstruction technique.   CONTRAST:  95m OMNIPAQUE IOHEXOL 300 MG/ML  SOLN   COMPARISON:  10/29/2021   FINDINGS: Cardiovascular: This examination is not tailored for the evaluation of the pulmonary vasculature. The right lower lobe pulmonary emboli seen on prior exam are less pronounced on this study.   The heart is unremarkable without pericardial effusion. Diffuse coronary artery atherosclerosis is unchanged. No evidence of thoracic aortic aneurysm or dissection. Stable aortic atherosclerosis.   Mediastinum/Nodes: Stable soft tissue at the left hilum measuring approximate 4.2 x 2.4 cm image 57/2, consistent with lymphadenopathy or primary bronchogenic malignancy. Right hilar adenopathy measuring up to 1.5 cm in short axis unchanged. Stable subcarinal lymph node measuring up to 1.4 cm in short axis.   Thyroid, trachea, and esophagus are unremarkable.   Lungs/Pleura: Severe bullous emphysematous changes are again noted. Spiculated left upper lobe nodule image 65/5 measures 1 cm, not appreciably changed. Rounded 9 mm left upper lobe solid nodule image 50/5 unchanged. Subpleural consolidation with spiculated margins in the right upper lobe measures 3.6 x 1.3 cm image 34/5, not appreciably changed.   No acute airspace disease, effusion, or pneumothorax. Central airways are patent.   Upper Abdomen: No acute abnormality.   Musculoskeletal: No acute or destructive bony lesions. Reconstructed images demonstrate no additional findings.   IMPRESSION: 1. Decreased prominence of the right lower lobe pulmonary emboli, with apparent resolution of the remaining pulmonary emboli seen previously. Evaluation of the pulmonary vasculature is limited on this exam due to technique and timing of  contrast bolus. 2. Left  upper lobe nodules and right upper lobe masslike consolidation, underlying malignancy not excluded. Follow-up PET CT may be useful if the patient would be a therapy candidate should neoplasm be detected. 3. Stable mediastinal and hilar lymphadenopathy. Differential includes metastatic disease or primary lymphoproliferative disorder in this patient with history of HIV. 4. Aortic Atherosclerosis (ICD10-I70.0) and Emphysema (ICD10-J43.9).     Electronically Signed   By: Randa Ngo M.D.   On: 11/09/2021 18:51      Past Medical History:    has a past medical history of Depression and HIV infection ([place]).   reports that he has quit smoking. His smoking use included cigarettes. He has a 12.50 pack-year smoking history. He has never used smokeless tobacco.  History reviewed. No pertinent surgical history.  Allergies  Allergen Reactions   Lidocaine Other (See Comments)    Large bruise at site of patch placement    Immunization History  Administered Date(s) Administered   Hepatitis A, Adult 11/18/2013, 08/31/2014   Influenza,inj,Quad PF,6+ Mos 02/12/2014, 03/21/2015, 06/15/2016, 07/14/2019, 03/24/2021   Meningococcal Mcv4o 06/15/2016   PPD Test 11/18/2013, 04/12/2015   Pneumococcal Polysaccharide-23 11/18/2013   Vaccinia,smallpox Monkeypox Vaccine Live,pf 03/24/2021, 04/21/2021    Family History  Problem Relation Age of Onset   Rheum arthritis Mother    Diabetes Mother    Hypertension Mother    Arthritis Mother      Current Facility-Administered Medications:    acetaminophen (TYLENOL) tablet 650 mg, 650 mg, Oral, Q6H PRN, 650 mg at 11/11/21 0759 **OR** acetaminophen (TYLENOL) suppository 650 mg, 650 mg, Rectal, Q6H PRN, Marcelyn Bruins, MD   albuterol (PROVENTIL) (2.5 MG/3ML) 0.083% nebulizer solution 3 mL, 3 mL, Inhalation, Q6H PRN, Marcelyn Bruins, MD   azithromycin (ZITHROMAX) tablet 500 mg, 500 mg, Oral, Daily, Vu, Trung T, MD    B-complex with vitamin C tablet 1 tablet, 1 tablet, Oral, Daily, Donne Hazel, MD, 1 tablet at 11/11/21 0946   bictegravir-emtricitabine-tenofovir AF (BIKTARVY) 50-200-25 MG per tablet 1 tablet, 1 tablet, Oral, Daily, Marcelyn Bruins, MD, 1 tablet at 11/11/21 0946   ethambutol (MYAMBUTOL) tablet 800 mg, 800 mg, Oral, Daily, Marcelyn Bruins, MD, 800 mg at 11/11/21 0946   feeding supplement (ENSURE ENLIVE / ENSURE PLUS) liquid 237 mL, 237 mL, Oral, BID BM, Donne Hazel, MD, 237 mL at 11/11/21 0946   guaiFENesin-dextromethorphan (ROBITUSSIN DM) 100-10 MG/5ML syrup 5 mL, 5 mL, Oral, Q4H PRN, Marcelyn Bruins, MD, 5 mL at 11/10/21 2046   ketoconazole (NIZORAL) 2 % cream, , Topical, BID, Vu, Trung T, MD   megestrol (MEGACE) 400 MG/10ML suspension 400 mg, 400 mg, Oral, q morning, Marcelyn Bruins, MD, 400 mg at 11/11/21 0946   metoprolol succinate (TOPROL-XL) 24 hr tablet 50 mg, 50 mg, Oral, q morning, Marcelyn Bruins, MD, 50 mg at 11/11/21 0946   mirtazapine (REMERON) tablet 15 mg, 15 mg, Oral, QHS, Marcelyn Bruins, MD, 15 mg at 11/10/21 2228   mometasone-formoterol (DULERA) 200-5 MCG/ACT inhaler 2 puff, 2 puff, Inhalation, BID, Marcelyn Bruins, MD, 2 puff at 11/11/21 1017   pantoprazole (PROTONIX) EC tablet 40 mg, 40 mg, Oral, QAC breakfast, Marcelyn Bruins, MD, 40 mg at 11/11/21 0759   polyethylene glycol (MIRALAX / GLYCOLAX) packet 17 g, 17 g, Oral, Daily PRN, Marcelyn Bruins, MD   QUEtiapine (SEROQUEL) tablet 200 mg, 200 mg, Oral, QHS, Marcelyn Bruins, MD, 200 mg at 11/10/21 2228   Rivaroxaban (XARELTO) tablet 15 mg, 15 mg,  Oral, BID WC, Marcelyn Bruins, MD, 15 mg at 11/11/21 0759   rosuvastatin (CRESTOR) tablet 5 mg, 5 mg, Oral, q morning, Marcelyn Bruins, MD, 5 mg at 11/11/21 0946   sodium chloride flush (NS) 0.9 % injection 3 mL, 3 mL, Intravenous, Q12H, Marcelyn Bruins, MD   umeclidinium bromide (INCRUSE ELLIPTA) 62.5 MCG/ACT 1 puff, 1 puff,  Inhalation, Daily, Marcelyn Bruins, MD, 1 puff at 11/11/21 [facility] Events:  11/09/2021 - admit   Interim History / Subjective:   11/11/2021 - seen in bed [address]  Objective   Blood pressure 109/66, pulse (!) 106, temperature (!) 100.8 F (38.2 C), temperature source Oral, resp. rate 19, height _0  (1.676 m), weight 67.9 kg, SpO2 100 %.        Intake/Output Summary (Last 24 hours) at 11/11/2021 1542 Last data filed at 11/11/2021 0840 Gross per 24 hour  Intake 460 ml  Output 750 ml  Net -290 ml   Filed Weights   11/09/21 2040  Weight: 67.9 kg    Examination: General: emaciated male. Pleasant. No distress. Watchign TV HENT: slurrged speech from old stroke Lungs: CTA bilaterally. No distress.  Cardiovascular: RRR Abdomen: abd soft Extremities: intact Neuro: Alert and Oriented x 3. Rt sided weakness GU: not examined  Resolved Hospital Problem list   x  Assessment & Plan:    HIV/AIDS with low CD4 and MAC    PE - end Jan 2023   Mediastinal ADenopathy  - Left hilar masss  4.2 x 2.4 cm   - Right hilar adenopathy measuring up to 1.5 cm  - Subcarinal lymph node measuring up to 1.4 cm in short axis.  Lung nodule -   - Left Upper Lobe 1cm  - Left IUpper Lobe 9pmm - RUL subpleural 3.6cm (with surrounding bullous emphysematous changes)  P:   Mediastinal adenopathy - EBUS by Dr Elsworth Soho 11/14/21 or 11/15/21 - request made BAL +/- TTBX - LUL nodule - Dr Elsworth Soho 11/14/21 or 11/15/21 BAL (no bx due to surrounding emphysema)  - Dr Elsworth Soho 11/14/21 or 11/15/21 NPO from 23.50 11/13/21 Xarelto for now -> pharmacy to consult to chagne to IV heparin as part of pre-procedure   D/w patient - Risks of pneumothorax, hemothorax, sedation/anesthesia complications such as cardiac or respiratory arrest or hypotension, stroke and bleeding all explained. Benefits of diagnosis but limitations of non-diagnosis also explained. Patient verbalized understanding and wished to proceed.     Best practice (daily eval):  Per triad   Goals of Care:    Family Updates: his mom Jammal Sarr [phone]. PAtient said we can tell mom about his HIV/AIDS etc.,        SIGNATURE    Dr. Brand Males, M.D., F.C.C.P,  Pulmonary and Critical Care Medicine Staff Physician, [place] Director - Interstitial Lung Disease  Program  [facility] [place], [place], [address]  NPI Number:  NPI #[id number]  Pager: [phone], If no answer  -> Check AMION or Try [phone] Telephone (clinical office): [phone] Telephone (research): [phone]  3:42 PM 11/11/2021   11/11/2021 3:42 PM    LABS    PULMONARY Recent Labs  Lab 11/09/21 1600  TCO2 20*    CBC Recent Labs  Lab 11/09/21 1541 11/09/21 1600 11/10/21 0502 11/11/21 0611  HGB 11.8* 11.6* 10.0* 9.5*  HCT 37.2* 34.0* 31.8* 30.7*  WBC 6.6  --  6.6 5.9  PLT 411*  --  358 326    COAGULATION Recent Labs  Lab 11/09/21 1541  INR 2.3*    CARDIAC  No results for input(s): TROPONINI in the last 168 hours. No results for input(s): PROBNP in the last 168 hours.  CHEMISTRY Recent Labs  Lab 11/09/21 1541 11/09/21 1600 11/10/21 0502 11/11/21 0611  NA 137 131* 136 139  K 4.9 6.3* 4.2 4.2  CL 105 107 108 109  CO2 19*  --  17* 21*  GLUCOSE 99 91 104* 112*  BUN 20 25* 13 13  CREATININE 1.34* 1.20 1.05 1.10  CALCIUM 11.2*  --  10.4* 9.7   Estimated Creatinine Clearance: 74.9 mL/min (by C-G formula based on SCr of 1.1 mg/dL).   LIVER Recent Labs  Lab 11/09/21 1541 11/10/21 0502 11/11/21 0611  AST 77* 67* 59*  ALT 115* 90* 77*  ALKPHOS 344* 285* 280*  BILITOT 0.5 0.8 0.4  PROT 9.5* 7.8 7.2  ALBUMIN 3.6 3.1* 2.8*  INR 2.3*  --   --      INFECTIOUS Recent Labs  Lab 11/09/21 1541 11/09/21 1831 11/10/21 0502 11/11/21 0611  LATICACIDVEN 2.6* 1.6  --   --   PROCALCITON 0.56  --  0.46 0.36      ENDOCRINE CBG (last 3)  Recent Labs    11/09/21 1452  GLUCAP 99         IMAGING x48h  - image(s) personally visualized  -   highlighted in bold CT Chest W Contrast  Result Date: 11/09/2021 CLINICAL DATA:  Short of breath, weakness, poor appetite for several days, lung mass, HIV EXAM: CT CHEST WITH CONTRAST TECHNIQUE: Multidetector CT imaging of the chest was performed during intravenous contrast administration. RADIATION DOSE REDUCTION: This exam was performed according to the departmental dose-optimization program which includes automated exposure control, adjustment of the mA and/or kV according to patient size and/or use of iterative reconstruction technique. CONTRAST:  80m OMNIPAQUE IOHEXOL 300 MG/ML  SOLN COMPARISON:  10/29/2021 FINDINGS: Cardiovascular: This examination is not tailored for the evaluation of the pulmonary vasculature. The right lower lobe pulmonary emboli seen on prior exam are less pronounced on this study. The heart is unremarkable without pericardial effusion. Diffuse coronary artery atherosclerosis is unchanged. No evidence of thoracic aortic aneurysm or dissection. Stable aortic atherosclerosis. Mediastinum/Nodes: Stable soft tissue at the left hilum measuring approximate 4.2 x 2.4 cm image 57/2, consistent with lymphadenopathy or primary bronchogenic malignancy. Right hilar adenopathy measuring up to 1.5 cm in short axis unchanged. Stable subcarinal lymph node measuring up to 1.4 cm in short axis. Thyroid, trachea, and esophagus are unremarkable. Lungs/Pleura: Severe bullous emphysematous changes are again noted. Spiculated left upper lobe nodule image 65/5 measures 1 cm, not appreciably changed. Rounded 9 mm left upper lobe solid nodule image 50/5 unchanged. Subpleural consolidation with spiculated margins in the right upper lobe measures 3.6 x 1.3 cm image 34/5, not appreciably changed. No acute airspace disease, effusion, or pneumothorax. Central airways are  patent. Upper Abdomen: No acute abnormality. Musculoskeletal: No acute or destructive bony lesions. Reconstructed images demonstrate no additional findings. IMPRESSION: 1. Decreased prominence of the right lower lobe pulmonary emboli, with apparent resolution of the remaining pulmonary emboli seen previously. Evaluation of the pulmonary vasculature is limited on this exam due to technique and timing of contrast bolus. 2. Left upper lobe nodules and right upper lobe masslike consolidation, underlying malignancy not excluded. Follow-up PET CT may be useful  if the patient would be a therapy candidate should neoplasm be detected. 3. Stable mediastinal and hilar lymphadenopathy. Differential includes metastatic disease or primary lymphoproliferative disorder in this patient with history of HIV. 4. Aortic Atherosclerosis (ICD10-I70.0) and Emphysema (ICD10-J43.9). Electronically Signed   By: Randa Ngo M.D.   On: 11/09/2021 18:51

## 2021-11-11 NOTE — Progress Notes (Signed)
Progress Note   Patient: Kurt Foley PJA:[id number] DOB: August 03, 1975 DOA: 11/09/2021     1 DOS: the patient was seen and examined on 11/11/2021   Brief hospital course: 46 y.o. male with medical history significant of substance use, right upper lobe mass, HIV, PML, MAI, emphysema, CVA, PE, depression presenting with weakness and shortness of breath.  Patient recently admitted from 5/20 until 5/24 with shortness of breath which is multifactorial.  Noted to have DVT and PE with right heart strain but no right heart failure started discharged on Xarelto, also noted to have a right upper lobe mass due to be evaluated by bronchoscopy by pulmonology after discharge has not yet been done, also with history of MAI on ethambutol, also with HIV/AIDS with CD4 of 46 on suppressive antibiotics,   Assessment and Plan: No notes have been filed under this hospital service. Service: Hospitalist SIRS > Patient presenting with shortness of breath and tachycardia with heart rate in the 130s and respiratory rate in the 42s qualifying for SIRS criteria.   > Urinalysis is without infection and chest x-ray shows stable changes from previous was treated for possible pneumonia during recent admission and is covered with prophylactic antibiotics of Bactrim as well as being on ethambutol for MAI. >  Febrile this AM. Appreciate input by ID. Doubt pneumonia with recs to stop rocephin -ID had recommended eval by Pulmonary to consider biopsy/tissue of known lung mass, with concern for fungal vs MAC infection vs malignancy -Pulmonary consulted   AKI > Creatinine elevated to 1.34 from baseline of 1.  Patient stated feeling dehydrated. > Renal function improved with IVF -repeat bmet in AM   Pulmonary embolus > Diagnosed during recent admission.  - Continue home Xarelto - Reviewed repeat CT chest. Findings of improved PE   Right upper lobe mass > Noted during recent admission, somewhat suspicious for cancer due to  adenopathy as well.  -Pt has scheduled appointment with Pulm on 6/12 noted for possible bronch and biopsy - Per above, ID recs to have pulm eval for possible tissue/biopsy here. -Have consulted Pulmonary for recs  History of CVA > Residual right hemiparesis - Continue home rosuvastatin  Emphysema - Replace home Symbicort with formulary Dulera - Continue home Incruse - As needed albuterol  HIV MAI > Last CD4 count earlier this month was 46.  Currently on prophylactic Bactrim.  Is also on ethambutol for MAI. Hold for now as pt to start tx for CAP - bactrim on hold for now per ID -Cont on azithro - Continue home Biktarvy  Substance use > History of polysubstance use including marijuana, cocaine, alcohol - Continue to monitor  Depression - Continue home Seroquel and mirtazapine  GERD - Continue home PPI   Transaminitis > Chronic transaminitis currently stable with AST 59, ALT 77, alk phos 280. - Cont to follow LFT trends        Subjective: Feeling chills this AM. Notes having fevers  Physical Exam: Vitals:   11/11/21 0749 11/11/21 0837 11/11/21 0951 11/11/21 1033  BP: 121/76 97/69 99/78 99/78  Pulse: (!) 114 (!) 109 99 (!) 103  Resp: _0 Temp: (!) 101.6 F (38.7 C) 98.9 F (37.2 C) 98.9 F (37.2 C) (!) 97.4 F (36.3 C)  TempSrc: Oral Oral Oral Oral  SpO2: 95% 95% 99% 98%  Weight:      Height:       General exam: Conversant, in no acute distress Respiratory system: normal chest rise, clear,  no audible wheezing Cardiovascular system: regular rhythm, s1-s2 Gastrointestinal system: Nondistended, nontender, pos BS Central nervous system: No seizures, no tremors Extremities: No cyanosis, no joint deformities Skin: No rashes, no pallor Psychiatry: Affect normal // no auditory hallucinations   Data Reviewed:  Labs reviewed: K 4.2, Cr 1.10, WBC 5.9   Family Communication: Pt in room, family not at bedside  Disposition: Status is: Inpatient Cont  inpatient stay because: Severity of illness  Planned Discharge Destination: Home    Author: Marylu Lund, MD 11/11/2021 3:00 PM  For on call review [email or web address].

## 2021-11-12 DIAGNOSIS — N179 Acute kidney failure, unspecified: Secondary | ICD-10-CM | POA: Diagnosis not present

## 2021-11-12 DIAGNOSIS — B20 Human immunodeficiency virus [HIV] disease: Secondary | ICD-10-CM | POA: Diagnosis not present

## 2021-11-12 DIAGNOSIS — J439 Emphysema, unspecified: Secondary | ICD-10-CM | POA: Diagnosis not present

## 2021-11-12 DIAGNOSIS — R6510 Systemic inflammatory response syndrome (SIRS) of non-infectious origin without acute organ dysfunction: Secondary | ICD-10-CM | POA: Diagnosis not present

## 2021-11-12 DIAGNOSIS — J189 Pneumonia, unspecified organism: Secondary | ICD-10-CM | POA: Diagnosis not present

## 2021-11-12 DIAGNOSIS — I2699 Other pulmonary embolism without acute cor pulmonale: Secondary | ICD-10-CM | POA: Diagnosis not present

## 2021-11-12 LAB — APTT
aPTT: 91 seconds — ABNORMAL HIGH (ref 24–36)
aPTT: 69 seconds — ABNORMAL HIGH (ref 24–36)
aPTT: 61 seconds — ABNORMAL HIGH (ref 24–36)

## 2021-11-12 LAB — CBC
HCT: 29.5 % — ABNORMAL LOW (ref 39.0–52.0)
Hemoglobin: 9.3 g/dL — ABNORMAL LOW (ref 13.0–17.0)
MCH: 27.8 pg (ref 26.0–34.0)
MCHC: 31.5 g/dL (ref 30.0–36.0)
MCV: 88.3 fL (ref 80.0–100.0)
Platelets: 370 10*3/uL (ref 150–400)
RBC: 3.34 MIL/uL — ABNORMAL LOW (ref 4.22–5.81)
RDW: 16.2 % — ABNORMAL HIGH (ref 11.5–15.5)
WBC: 6.6 10*3/uL (ref 4.0–10.5)
nRBC: 0 % (ref 0.0–0.2)

## 2021-11-12 LAB — MAGNESIUM: Magnesium: 2 mg/dL (ref 1.7–2.4)

## 2021-11-12 LAB — RPR: RPR Ser Ql: NONREACTIVE

## 2021-11-12 LAB — PHOSPHORUS: Phosphorus: 4.7 mg/dL — ABNORMAL HIGH (ref 2.5–4.6)

## 2021-11-12 LAB — HEPARIN LEVEL (UNFRACTIONATED): Heparin Unfractionated: >1.1 IU/mL — ABNORMAL HIGH (ref 0.30–0.70)

## 2021-11-12 MED ORDER — HEPARIN (PORCINE) 25000 UT/250ML-% IV SOLN
1450.0000 [IU]/h | INTRAVENOUS | Status: DC
Start: 2021-11-12 — End: 2021-11-14
  Administered 2021-11-12: 1200 [IU]/h via INTRAVENOUS
  Administered 2021-11-13: 1350 [IU]/h via INTRAVENOUS
  Filled 2021-11-12 (×3): qty 250

## 2021-11-12 MED ORDER — MORPHINE SULFATE (PF) 2 MG/ML IV SOLN
1.0000 mg | INTRAVENOUS | Status: DC | PRN
  Administered 2021-11-12 – 2021-11-14 (×4): 1 mg via INTRAVENOUS
  Filled 2021-11-12 (×4): qty 1

## 2021-11-12 NOTE — Progress Notes (Signed)
ANTICOAGULATION CONSULT NOTE   Pharmacy Consult for Xarelto to IV heparin Indication: history of PE/DVT  Allergies  Allergen Reactions   Lidocaine Other (See Comments)    Large bruise at site of patch placement    Patient Measurements: Height: 5\' 6"  (167.6 cm) Weight: 67.9 kg (149 lb 11.1 oz) IBW/kg (Calculated) : 63.8 Heparin Dosing Weight: 67.9 kg  Vital Signs: Temp: 100.5 F (38.1 C) (06/03 2001) Temp Source: Oral (06/03 2001) BP: 98/77 (06/03 2001) Pulse Rate: 107 (06/03 2001)  Labs: Recent Labs    11/10/21 0502 11/11/21 01/11/22 11/11/21 1713 11/12/21 0112 11/12/21 0748 11/12/21 1756  HGB 10.0* 9.5*  --  9.3*  --   --   HCT 31.8* 30.7*  --  29.5*  --   --   PLT 358 326  --  370  --   --   APTT  --   --   --  91* 69* 61*  HEPARINUNFRC  --   --  >1.10* >1.10*  --   --   CREATININE 1.05 1.10  --   --   --   --      Estimated Creatinine Clearance: 74.9 mL/min (by C-G formula based on SCr of 1.1 mg/dL).   Medical History: Past Medical History:  Diagnosis Date   Depression    HIV infection (HCC)    Assessment: 46 yo male presenting with shortness of breath.  PMH includes submassive PE and + DVT diagnosed 10/29/21 on Xarelto 15mg  PO BID and mediastinal adenopathy.  PCCM is consulted to perform bronchoscopy tentatively planned on 6/5 or 6/6.  Pharmacy has been consulted to transition Xarelto to IV heparin in preparation for bronchoscopy. Last dose of Xarelto was 6/2 at 0759.    Baseline coag labs drawn 5/31: aPTT 55, INR 2.3, Hgb 11.8  11/12/2021: aPTT = 61 seconds, slightly subtherapeutic  Heparin level remains >1.1- elevated due to recent Xarelto use CBC: Hgb low but relatively unchanged in past 24h; Pltc WNL No bleeding or infusion related concerns per RN No interruptions in therapy per RN  Goal of Therapy:  Heparin level 0.3-0.7 units/ml aPTT 66-102 seconds Monitor platelets by anticoagulation protocol: Yes Heparin level is falsely elevated at > 1.1 due to  recent Xarelto admin- plan to dose IV heparin using aPTT until level is correlating with heparin level.    Plan:  Increase heparin infusion slightly to 1200 units/hr  Check aPTT 6 hours after rate change Daily CBC, heparin level, aPTT Monitor closely for s/sx of bleeding   6/31, PharmD, BCPS 11/12/2021 8:39 PM

## 2021-11-12 NOTE — Evaluation (Addendum)
Physical Therapy Evaluation Patient Details Name: Kurt Foley MRN: [id number] DOB: 1975/08/02 Today's Date: 11/12/2021  History of Present Illness  46 yo male admitted with SIRS. Kurt Foley hx of PE/DVT. 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  Clinical Impression  On eval, pt was Min guard assist for mobility. Assist required to don/doff socks, shoes, AFO. He walked ~225 feet with a hemiwalker. O2 90% on RA, HR 116 bpm during session. He tolerated session well. Recommend daily OOB/mobility with nursing assistance, in addition to PT session. Will plan to follow during hospital stay. Recommend pt resume OP PT once able/ready.       Recommendations for follow up therapy are one component of a multi-disciplinary discharge planning process, led by the attending physician.  Recommendations may be updated based on patient status, additional functional criteria and insurance authorization.  Follow Up Recommendations Outpatient PT    Assistance Recommended at Discharge PRN  Patient can return home with the following  A little help with walking and/or transfers;Help with stairs or ramp for entrance    Equipment Recommendations None recommended by PT  Recommendations for Other Services       Functional Status Assessment Patient has had a recent decline in their functional status and demonstrates the ability to make significant improvements in function in a reasonable and predictable amount of time.     Precautions / Restrictions Precautions Precautions: Fall Precaution Comments: R residual weakness UE/LE; R foot AFO Restrictions Weight Bearing Restrictions: No      Mobility  Bed Mobility Overal bed mobility: Needs Assistance Bed Mobility: Sidelying to Sit, Sit to Supine     Supine to sit: Modified independent (Device/Increase time) Sit to supine: Modified independent (Device/Increase time)        Transfers Overall  transfer level: Needs assistance Equipment used: Hemi-walker Transfers: Sit to/from Stand Sit to Stand: Supervision           General transfer comment: Supv for safety, lines    Ambulation/Gait Ambulation/Gait assistance: Min guard Gait Distance (Feet): 225 Feet Assistive device: Hemi-walker         General Gait Details: Supv for safety. Mild dyspnea and fatigue. O2 90% on RA. No LOB with hemiwalker use. Good clearance of R foot with AFO use. Slow but steady gait  Stairs            Wheelchair Mobility    Modified Rankin (Stroke Patients Only)       Balance Overall balance assessment: Mild deficits observed, not formally tested                                           Pertinent Vitals/Pain Pain Assessment Pain Assessment: No/denies pain    Home Living Family/patient expects to be discharged to:: Private residence   Available Help at Discharge: Family;Available 24 hours/day Type of Home: House Home Access: Stairs to enter Entrance Stairs-Rails: Right Entrance Stairs-Number of Steps: curb   Home Layout: One level Home Equipment: Agricultural consultant (2 wheels);Cane - single point;Shower seat Additional Comments: Hemi-walker; AFO, resting hand splint R hand    Prior Function Prior Level of Function : Needs assist           ADLs (physical): Bathing;Dressing;Toileting Mobility Comments: Uses hemi-walker - walks in house, to car; limited community ADLs Comments: Pt has help with ADLs - dressing  and bathing     Hand Dominance        Extremity/Trunk Assessment   Upper Extremity Assessment Upper Extremity Assessment: Defer to OT evaluation RUE Deficits / Details: no functional use of R UE; R UE contracted, has resting hand splint at home that patient reported he has not worn in a while.    Lower Extremity Assessment RLE Deficits / Details: able to weightbear with AFO RLE Coordination: decreased fine motor;decreased gross motor     Cervical / Trunk Assessment Cervical / Trunk Assessment: Normal  Communication   Communication: Expressive difficulties (at times)  Cognition Arousal/Alertness: Awake/alert Behavior During Therapy: WFL for tasks assessed/performed Overall Cognitive Status: Within Functional Limits for tasks assessed                                          General Comments      Exercises     Assessment/Plan    PT Assessment Patient needs continued PT services  PT Problem List Decreased mobility;Impaired tone;Decreased coordination       PT Treatment Interventions DME instruction;Gait training;Therapeutic activities;Therapeutic exercise;Patient/family education;Functional mobility training    PT Goals (Current goals can be found in the Care Plan section)  Acute Rehab PT Goals Patient Stated Goal: to be able to mobilize more PT Goal Formulation: With patient Time For Goal Achievement: 11/26/21 Potential to Achieve Goals: Good    Frequency Min 3X/week     Co-evaluation               AM-PAC PT "6 Clicks" Mobility  Outcome Measure Help needed turning from your back to your side while in a flat bed without using bedrails?: A Little Help needed moving from lying on your back to sitting on the side of a flat bed without using bedrails?: A Little Help needed moving to and from a bed to a chair (including a wheelchair)?: A Little Help needed standing up from a chair using your arms (e.g., wheelchair or bedside chair)?: A Little Help needed to walk in hospital room?: A Little Help needed climbing 3-5 steps with a railing? : A Little 6 Click Score: 18    End of Session Equipment Utilized During Treatment: Gait belt Activity Tolerance: Patient tolerated treatment well Patient left: in bed;with call bell/phone within reach;with bed alarm set   PT Visit Diagnosis: Difficulty in walking, not elsewhere classified (R26.2);Other symptoms and signs involving the nervous  system (J67.341)    Time: 9379-0240 PT Time Calculation (min) (ACUTE ONLY): 31 min   Charges:   PT Evaluation $PT Eval Moderate Complexity: 1 Mod            Faye Ramsay, PT Acute Rehabilitation  Office: [phone]

## 2021-11-12 NOTE — Plan of Care (Signed)

## 2021-11-12 NOTE — Progress Notes (Signed)
Progress Note   Patient: Kurt Foley MGQ:[id number] DOB: [birth year] DOA: 11/09/2021     2 DOS: the patient was seen and examined on 11/12/2021   Brief hospital course: 46 y.o. male with medical history significant of substance use, right upper lobe mass, HIV, PML, MAI, emphysema, CVA, PE, depression presenting with weakness and shortness of breath.  Patient recently admitted from 5/20 until 5/24 with shortness of breath which is multifactorial.  Noted to have DVT and PE with right heart strain but no right heart failure started discharged on Xarelto, also noted to have a right upper lobe mass due to be evaluated by bronchoscopy by pulmonology after discharge has not yet been done, also with history of MAI on ethambutol, also with HIV/AIDS with CD4 of 46 on suppressive antibiotics,   Assessment and Plan: No notes have been filed under this hospital service. Service: Hospitalist SIRS > Patient presenting with shortness of breath and tachycardia with heart rate in the 130s and respiratory rate in the 30s qualifying for SIRS criteria.   > Urinalysis is without infection and chest x-ray shows stable changes from previous was treated for possible pneumonia during recent admission and is covered with prophylactic antibiotics of Bactrim as well as being on ethambutol for MAI. >  Febrile this visit. Appreciate input by ID. Doubt pneumonia with recs to stop rocephin -ID had recommended eval by Pulmonary to consider biopsy/tissue of known lung mass, with concern for fungal vs MAC infection vs malignancy -Pulmonary consulted. Plans for bronch 6/5   AKI > Creatinine elevated to 1.34 from baseline of 1.  Patient stated feeling dehydrated. > Renal function improved with IVF -repeat bmet in AM   Pulmonary embolus > Diagnosed during recent admission.  - Continue home Xarelto - Reviewed repeat CT chest. Findings of improved PE   Right upper lobe mass > Noted during recent admission, somewhat  suspicious for cancer due to adenopathy as well.  -Pt has scheduled appointment with Pulm on 6/12 noted for possible bronch and biopsy - Per above, ID recs to have pulm eval for possible tissue/biopsy here. -Have consulted Pulmonary. Plan for bronch with biopsy/tissue on 6/5  History of CVA > Residual right hemiparesis - Continue home rosuvastatin  Emphysema - Replace home Symbicort with formulary Dulera - Continue home Incruse - Cont PRN albuterol  HIV MAI > Last CD4 count earlier this month was 46.  Currently on prophylactic Bactrim.  Is also on ethambutol for MAI. Hold for now as pt to start tx for CAP - bactrim on hold for now per ID -Cont on azithro - Continue home Biktarvy  Substance use > History of polysubstance use including marijuana, cocaine, alcohol - Continue to monitor  Depression - Continue home Seroquel and mirtazapine  GERD - Continue home PPI   Transaminitis > Chronic transaminitis currently stable with AST 59, ALT 77, alk phos 280. - Cont to follow LFT trends       Subjective: Without complaints this AM  Physical Exam: Vitals:   11/11/21 2243 11/12/21 0505 11/12/21 0750 11/12/21 1247  BP: 102/71 102/80  103/76  Pulse: 100 94  (!) 113  Resp: _0 Temp: 98.4 F (36.9 C) 98.1 F (36.7 C)  98.7 F (37.1 C)  TempSrc: Oral Oral  Oral  SpO2: 92% 96% 94% 95%  Weight:      Height:       General exam: Awake, laying in bed, in nad Respiratory system: Normal respiratory effort, no wheezing  Cardiovascular system: regular rate, s1, s2 Gastrointestinal system: Soft, nondistended, positive BS Central nervous system: CN2-12 grossly intact, strength intact Extremities: Perfused, no clubbing Skin: Normal skin turgor, no notable skin lesions seen Psychiatry: Mood normal // no visual hallucinations   Data Reviewed:  Labs reviewed: Phos 4.7, Mg 2.0  Family Communication: Pt in room, family not at bedside  Disposition: Status is:  Inpatient Cont inpatient stay because: Severity of illness  Planned Discharge Destination: Home    Author: Marylu Lund, MD 11/12/2021 3:01 PM  For on call review [email or web address].

## 2021-11-12 NOTE — Progress Notes (Addendum)
ANTICOAGULATION CONSULT NOTE   Pharmacy Consult for Xarelto to IV heparin Indication: history of PE/DVT  Allergies  Allergen Reactions   Lidocaine Other (See Comments)    Large bruise at site of patch placement    Patient Measurements: Height: 5\' 6"  (167.6 cm) Weight: 67.9 kg (149 lb 11.1 oz) IBW/kg (Calculated) : 63.8 Heparin Dosing Weight: 67.9 kg  Vital Signs: Temp: 98.1 F (36.7 C) (06/03 0505) Temp Source: Oral (06/03 0505) BP: 102/80 (06/03 0505) Pulse Rate: 94 (06/03 0505)  Labs: Recent Labs    11/09/21 1541 11/09/21 1600 11/10/21 0502 11/11/21 0611 11/11/21 1713 11/12/21 0112 11/12/21 0748  HGB 11.8* 11.6* 10.0* 9.5*  --  9.3*  --   HCT 37.2* 34.0* 31.8* 30.7*  --  29.5*  --   PLT 411*  --  358 326  --  370  --   APTT 55*  --   --   --   --  91* 69*  LABPROT 25.3*  --   --   --   --   --   --   INR 2.3*  --   --   --   --   --   --   HEPARINUNFRC  --   --   --   --  >1.10* >1.10*  --   CREATININE 1.34* 1.20 1.05 1.10  --   --   --      Estimated Creatinine Clearance: 74.9 mL/min (by C-G formula based on SCr of 1.1 mg/dL).   Medical History: Past Medical History:  Diagnosis Date   Depression    HIV infection (HCC)    Assessment: 46 yo male presenting with shortness of breath.  PMH includes submassive PE and + DVT diagnosed 10/29/21 on Xarelto 15mg  PO BID and mediastinal adenopathy.  PCCM is consulted to perform bronchoscopy tentatively planned on 6/5 or 6/6.  Pharmacy has been consulted to transition Xarelto to IV heparin in preparation for bronchoscopy. Last dose of Xarelto was 6/2 at 0759.    Baseline coag labs drawn 5/31: aPTT 55, INR 2.3, Hgb 11.8  11/12/2021: aPTT = 69 seconds, on lower end of therapeutic range Heparin level remains >1.1- elevated due to recent Xarelto use CBC: Hgb low but relatively unchanged in past 24h; Pltc WNL No bleeding or infusion related concerns per RN No interruptions in therapy per RN  Goal of Therapy:  Heparin  level 0.3-0.7 units/ml aPTT 66-102 seconds Monitor platelets by anticoagulation protocol: Yes Heparin level is falsely elevated at > 1.1 due to recent Xarelto admin- plan to dose IV heparin using aPTT until level is correlating with heparin level.    Plan:  Increase heparin infusion slightly to 1100 units/hr  Check aPTT 6 hours after rate change Daily CBC, heparin level, aPTT Monitor closely for s/sx of bleeding   6/31, PharmD, BCPS Clinical Pharmacist  11/12/2021 9:44 AM

## 2021-11-12 NOTE — Progress Notes (Signed)
ANTICOAGULATION CONSULT NOTE   Pharmacy Consult for Xarelto to IV heparin Indication: history of pulmonary embolism  Allergies  Allergen Reactions   Lidocaine Other (See Comments)    Large bruise at site of patch placement    Patient Measurements: Height: 5\' 6"  (167.6 cm) Weight: 67.9 kg (149 lb 11.1 oz) IBW/kg (Calculated) : 63.8 Heparin Dosing Weight: 67.9 kg  Vital Signs: Temp: 98.4 F (36.9 C) (06/02 2243) Temp Source: Oral (06/02 2243) BP: 102/71 (06/02 2243) Pulse Rate: 100 (06/02 2243)  Labs: Recent Labs    11/09/21 1541 11/09/21 1600 11/10/21 0502 11/11/21 0611 11/11/21 1713 11/12/21 0112  HGB 11.8* 11.6* 10.0* 9.5*  --  9.3*  HCT 37.2* 34.0* 31.8* 30.7*  --  29.5*  PLT 411*  --  358 326  --  370  APTT 55*  --   --   --   --  91*  LABPROT 25.3*  --   --   --   --   --   INR 2.3*  --   --   --   --   --   HEPARINUNFRC  --   --   --   --  >1.10* >1.10*  CREATININE 1.34* 1.20 1.05 1.10  --   --      Estimated Creatinine Clearance: 74.9 mL/min (by C-G formula based on SCr of 1.1 mg/dL).   Medical History: Past Medical History:  Diagnosis Date   Depression    HIV infection (HCC)     Medications:  Scheduled:   azithromycin  500 mg Oral Daily   B-complex with vitamin C  1 tablet Oral Daily   bictegravir-emtricitabine-tenofovir AF  1 tablet Oral Daily   ethambutol  800 mg Oral Daily   feeding supplement  237 mL Oral BID BM   ketoconazole   Topical BID   megestrol  400 mg Oral q morning   metoprolol succinate  50 mg Oral q morning   mirtazapine  15 mg Oral QHS   mometasone-formoterol  2 puff Inhalation BID   pantoprazole  40 mg Oral QAC breakfast   QUEtiapine  200 mg Oral QHS   rosuvastatin  5 mg Oral q morning   sodium chloride flush  3 mL Intravenous Q12H   umeclidinium bromide  1 puff Inhalation Daily    Assessment: 46 yo male presenting with shortness of breath.  PMH includes submassive PE and + DVT diagnosed 10/29/21 on Xarelto 15mg  PO BID  and mediastinal adenopathy.  PCCM is consulted to perform bronchoscopy tentatively planned on 6/5 or 6/6.  Pharmacy has been consulted to transition Xarelto to IV heparin in preparation for bronchoscopy. Last dose of Xarelto was 6/2 at 0759.    Baseline coag labs drawn 5/31: aPTT 55, INR 2.3, Hgb 11.8  11/12/2021: Initial aPTT 91 sec- therapeutic on IV heparin 1050 units/hr Heparin level remains >1.1- elevated due to recent Xarelto use CBC: Hg low but relatively unchanged in past 24h; pltc WNL No bleeding or infusion related concerns per RN  Goal of Therapy:  Heparin level 0.3-0.7 units/ml aPTT 66-102 seconds Monitor platelets by anticoagulation protocol: Yes Heparin level is falsely elevated at > 1.1 due to recent Xarelto admin- plan to dose IV heparin using aPTT's until level is correlating with heparin level.    Plan:  Continue heparin infusion at 1050 units/hr  Check confirmatory aPTT at 0800 Monitor daily heparin level and CBC while on heparin  6/31, PharmD 11/12/2021 2:18 AM

## 2021-11-13 DIAGNOSIS — R590 Localized enlarged lymph nodes: Secondary | ICD-10-CM | POA: Diagnosis not present

## 2021-11-13 DIAGNOSIS — R6510 Systemic inflammatory response syndrome (SIRS) of non-infectious origin without acute organ dysfunction: Secondary | ICD-10-CM | POA: Diagnosis not present

## 2021-11-13 DIAGNOSIS — N179 Acute kidney failure, unspecified: Secondary | ICD-10-CM | POA: Diagnosis not present

## 2021-11-13 DIAGNOSIS — R918 Other nonspecific abnormal finding of lung field: Secondary | ICD-10-CM | POA: Diagnosis not present

## 2021-11-13 DIAGNOSIS — I2699 Other pulmonary embolism without acute cor pulmonale: Secondary | ICD-10-CM

## 2021-11-13 DIAGNOSIS — J189 Pneumonia, unspecified organism: Secondary | ICD-10-CM | POA: Diagnosis not present

## 2021-11-13 DIAGNOSIS — J439 Emphysema, unspecified: Secondary | ICD-10-CM | POA: Diagnosis not present

## 2021-11-13 DIAGNOSIS — B20 Human immunodeficiency virus [HIV] disease: Secondary | ICD-10-CM | POA: Diagnosis not present

## 2021-11-13 LAB — CBC
HCT: 27.8 % — ABNORMAL LOW (ref 39.0–52.0)
Hemoglobin: 8.7 g/dL — ABNORMAL LOW (ref 13.0–17.0)
MCH: 28 pg (ref 26.0–34.0)
MCHC: 31.3 g/dL (ref 30.0–36.0)
MCV: 89.4 fL (ref 80.0–100.0)
Platelets: 383 10*3/uL (ref 150–400)
RBC: 3.11 MIL/uL — ABNORMAL LOW (ref 4.22–5.81)
RDW: 16.2 % — ABNORMAL HIGH (ref 11.5–15.5)
WBC: 4.9 10*3/uL (ref 4.0–10.5)
nRBC: 0 % (ref 0.0–0.2)

## 2021-11-13 LAB — HEPARIN LEVEL (UNFRACTIONATED): Heparin Unfractionated: 0.86 IU/mL — ABNORMAL HIGH (ref 0.30–0.70)

## 2021-11-13 LAB — APTT
aPTT: 56 seconds — ABNORMAL HIGH (ref 24–36)
aPTT: 84 seconds — ABNORMAL HIGH (ref 24–36)
aPTT: 71 seconds — ABNORMAL HIGH (ref 24–36)

## 2021-11-13 MED ORDER — HEPARIN BOLUS VIA INFUSION
1500.0000 [IU] | Freq: Once | INTRAVENOUS | Status: AC
Start: 2021-11-13 — End: 2021-11-13
  Administered 2021-11-13: 1500 [IU] via INTRAVENOUS
  Filled 2021-11-13: qty 1500

## 2021-11-13 NOTE — Progress Notes (Signed)
Progress Note   Patient: Kurt Foley:[id number] DOB: [birth year] DOA: 11/09/2021     3 DOS: the patient was seen and examined on 11/13/2021   Brief hospital course: 46 y.o. male with medical history significant of substance use, right upper lobe mass, HIV, PML, MAI, emphysema, CVA, PE, depression presenting with weakness and shortness of breath.  Patient recently admitted from 5/20 until 5/24 with shortness of breath which is multifactorial.  Noted to have DVT and PE with right heart strain but no right heart failure started discharged on Xarelto, also noted to have a right upper lobe mass due to be evaluated by bronchoscopy by pulmonology after discharge has not yet been done, also with history of MAI on ethambutol, also with HIV/AIDS with CD4 of 46 on suppressive antibiotics,   Assessment and Plan: No notes have been filed under this hospital service. Service: Hospitalist SIRS > Patient presenting with shortness of breath and tachycardia with heart rate in the 130s and respiratory rate in the 30s qualifying for SIRS criteria.   > Urinalysis is without infection and chest x-ray shows stable changes from previous was treated for possible pneumonia during recent admission and is covered with prophylactic antibiotics of Bactrim as well as being on ethambutol for MAI. >  Febrile this visit. Appreciate input by ID. Doubt pneumonia with recs to stop rocephin -ID had recommended eval by Pulmonary to consider biopsy/tissue of known lung mass, with concern for fungal vs MAC infection vs malignancy -Pulmonary following with plan for bronch 6/5   AKI > Creatinine elevated to 1.34 from baseline of 1.  Patient stated feeling dehydrated. > Renal function improved with IVF -recheck bmet in AM   Pulmonary embolus > Diagnosed during recent admission.  - Currently on heparin gtt - Reviewed repeat CT chest. Findings of improved PE   Right upper lobe mass > Noted during recent admission,  somewhat suspicious for cancer due to adenopathy as well.  -Pt has scheduled appointment with Pulm on 6/12 noted for possible bronch and biopsy - Per above, ID recs to have pulm eval for possible tissue/biopsy here. -Pulm following with plans for bronch with biopsy/tissue on 6/5  History of CVA > Residual right hemiparesis - Continue home rosuvastatin  Emphysema - Replace home Symbicort with formulary Dulera - Continue home Incruse - Cont PRN albuterol  HIV MAI > Last CD4 count earlier this month was 46.  Currently on prophylactic Bactrim.  Is also on ethambutol for MAI. Hold for now as pt to start tx for CAP - bactrim on hold for now per ID -Cont on azithro - Continue home Biktarvy  Substance use > History of polysubstance use including marijuana, cocaine, alcohol - Continue to monitor  Depression - Continue home Seroquel and mirtazapine  GERD - Continue home PPI   Transaminitis > Chronic transaminitis  with most recent LFT's stable with AST 59, ALT 77, alk phos 280. - Cont to follow LFT trends       Subjective: No complaints this afternoon  Physical Exam: Vitals:   11/13/21 0735 11/13/21 0957 11/13/21 1001 11/13/21 1325  BP:  106/81 112/75 114/85  Pulse:  (!) 104 (!) 104 95  Resp:  14  14  Temp:  99.9 F (37.7 C)  100.3 F (37.9 C)  TempSrc:  Oral  Oral  SpO2: 96% 100%  97%  Weight:      Height:       General exam: Asleep, laying in bed, in nad Respiratory system: Normal  respiratory effort, no wheezing Cardiovascular system: regular rate, s1, s2 Gastrointestinal system: Soft, nondistended, positive BS Central nervous system: CN2-12 grossly intact, strength intact Extremities: Perfused, no clubbing Skin: Normal skin turgor, no notable skin lesions seen Psychiatry: Mood normal // no visual hallucinations   Data Reviewed:  There are no new results to review at this time.  Family Communication: Pt in room, family not at bedside  Disposition: Status  is: Inpatient Cont inpatient stay because: Severity of illness  Planned Discharge Destination: Home    Author: Marylu Lund, MD 11/13/2021 1:55 PM  For on call review [email or web address].

## 2021-11-13 NOTE — Progress Notes (Signed)
Physical Therapy Treatment Patient Details Name: Kurt Foley MRN: [id number] DOB: [birth year] Today's Date: 11/13/2021   History of Present Illness 46 yo male admitted with SIRS. [name] hx of PE/DVT. 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    PT Comments    Pt participated well. He is always motivated to mobilize. Will continue to follow during hospital stay.    Recommendations for follow up therapy are one component of a multi-disciplinary discharge planning process, led by the attending physician.  Recommendations may be updated based on patient status, additional functional criteria and insurance authorization.  Follow Up Recommendations  Outpatient PT     Assistance Recommended at Discharge PRN  Patient can return home with the following A little help with walking and/or transfers;Help with stairs or ramp for entrance   Equipment Recommendations  None recommended by PT    Recommendations for Other Services       Precautions / Restrictions Precautions Precautions: Fall Precaution Comments: R residual weakness UE/LE; R foot AFO Restrictions Weight Bearing Restrictions: No     Mobility  Bed Mobility   Bed Mobility: Supine to Sit     Supine to sit: Modified independent (Device/Increase time)          Transfers Overall transfer level: Needs assistance Equipment used: Hemi-walker Transfers: Sit to/from Stand Sit to Stand: Supervision           General transfer comment: Supv for safety, lines    Ambulation/Gait Ambulation/Gait assistance: Supervision Gait Distance (Feet): 230 Feet Assistive device: Hemi-walker         General Gait Details: Supv for safety.No LOB with hemiwalker use. Good clearance of R foot with AFO use. Slow but steady gait   Stairs             Wheelchair Mobility    Modified Rankin (Stroke Patients Only)       Balance Overall balance assessment: Mild  deficits observed, not formally tested                                          Cognition Arousal/Alertness: Awake/alert Behavior During Therapy: WFL for tasks assessed/performed Overall Cognitive Status: Within Functional Limits for tasks assessed                                          Exercises      General Comments        Pertinent Vitals/Pain Pain Assessment Pain Assessment: No/denies pain    Home Living                          Prior Function            PT Goals (current goals can now be found in the care plan section) Progress towards PT goals: Progressing toward goals    Frequency    Min 3X/week      PT Plan Current plan remains appropriate    Co-evaluation              AM-PAC PT "6 Clicks" Mobility   Outcome Measure  Help needed turning from your back to your side while in a flat bed without using bedrails?: A Little Help needed moving  from lying on your back to sitting on the side of a flat bed without using bedrails?: A Little Help needed moving to and from a bed to a chair (including a wheelchair)?: A Little Help needed standing up from a chair using your arms (e.g., wheelchair or bedside chair)?: A Little Help needed to walk in hospital room?: A Little Help needed climbing 3-5 steps with a railing? : A Little 6 Click Score: 18    End of Session Equipment Utilized During Treatment: Gait belt Activity Tolerance: Patient tolerated treatment well Patient left: in bed;with call bell/phone within reach   PT Visit Diagnosis: Difficulty in walking, not elsewhere classified (R26.2);Other symptoms and signs involving the nervous system (R29.898)     Time: 1520-1540 PT Time Calculation (min) (ACUTE ONLY): 20 min  Charges:  $Gait Training: 8-22 mins                         Faye Ramsay, PT Acute Rehabilitation  Office: [phone] Pager: [phone]

## 2021-11-13 NOTE — Consult Note (Signed)
NAME:  Kurt Foley, MRN:  [id number], DOB:  03/24/1976, LOS: 3 ADMISSION DATE:  11/09/2021, CONSULTATION DATE:  11/11/21 REFERRING MD:  Dr Wyline Copas, CHIEF COMPLAINT:  AID with lung mass, nodules, mediastinal adenopathy. Recent PE   BRIEF  -46 year old patient with advanced AIDS.,  Substance abuse, depression disseminated MAC, prior CVA with residual right-sided weakness recent admission first on 10/06/2021 - 10/07/2021 with dry cough and fever.  Pulmonary embolism negative.  But had progressive masslike peripheral right upper lobe opacity subpleural.  Then again 10/29/2021 - 11/02/2021 with bilateral pulmonary embolism carpal mild treated with the systemic anticoagulation discharged on Xarelto.  Set up to see Dr. Leory Plowman Icard for evaluation of bronchoscopy mid June 2023 but readmitted 11/09/2021 with 2 to 3 days of painful cough palpitations decreased appetite.  Tachycardic.  Mild elevation lactic acid 2.6.  Infectious disease does not think this is a viral syndrome..  Repeat CT scan of the chest confirms the abnormal pulmonary parenchymal findings and mediastinal/hilar/subcarinal adenopathy.  History also reveals that he has missed 2 months of MAC treatment.  No dyspnea or fever.  Infectious disease requesting tissue sampling of the lung masses and also mediastinal adenopathy for pathology and culture.  Therefore pulmonary consulted.  Patient currently on Xarelto.  He is not NPO.  Previous recent ID workup 5/31 bcx negative to date 5/31 urine cx <10k insignificant growth; no urinary sx 4/27 hepatitis panel negative 07/2021 crypto ag negative; blasto ag negative CT chest 11/09/21  Narrative & Impression  CLINICAL DATA:  Short of breath, weakness, poor appetite for several days, lung mass, HIV   EXAM: CT CHEST WITH CONTRAST   TECHNIQUE: Multidetector CT imaging of the chest was performed during intravenous contrast administration.   RADIATION DOSE REDUCTION: This exam was performed according to  the departmental dose-optimization program which includes automated exposure control, adjustment of the mA and/or kV according to patient size and/or use of iterative reconstruction technique.   CONTRAST:  61m OMNIPAQUE IOHEXOL 300 MG/ML  SOLN   COMPARISON:  10/29/2021   FINDINGS: Cardiovascular: This examination is not tailored for the evaluation of the pulmonary vasculature. The right lower lobe pulmonary emboli seen on prior exam are less pronounced on this study.   The heart is unremarkable without pericardial effusion. Diffuse coronary artery atherosclerosis is unchanged. No evidence of thoracic aortic aneurysm or dissection. Stable aortic atherosclerosis.   Mediastinum/Nodes: Stable soft tissue at the left hilum measuring approximate 4.2 x 2.4 cm image 57/2, consistent with lymphadenopathy or primary bronchogenic malignancy. Right hilar adenopathy measuring up to 1.5 cm in short axis unchanged. Stable subcarinal lymph node measuring up to 1.4 cm in short axis.   Thyroid, trachea, and esophagus are unremarkable.   Lungs/Pleura: Severe bullous emphysematous changes are again noted. Spiculated left upper lobe nodule image 65/5 measures 1 cm, not appreciably changed. Rounded 9 mm left upper lobe solid nodule image 50/5 unchanged. Subpleural consolidation with spiculated margins in the right upper lobe measures 3.6 x 1.3 cm image 34/5, not appreciably changed.   No acute airspace disease, effusion, or pneumothorax. Central airways are patent.   Upper Abdomen: No acute abnormality.   Musculoskeletal: No acute or destructive bony lesions. Reconstructed images demonstrate no additional findings.   IMPRESSION: 1. Decreased prominence of the right lower lobe pulmonary emboli, with apparent resolution of the remaining pulmonary emboli seen previously. Evaluation of the pulmonary vasculature is limited on this exam due to technique and timing of contrast bolus. 2. Left upper  lobe nodules  and right upper lobe masslike consolidation, underlying malignancy not excluded. Follow-up PET CT may be useful if the patient would be a therapy candidate should neoplasm be detected. 3. Stable mediastinal and hilar lymphadenopathy. Differential includes metastatic disease or primary lymphoproliferative disorder in this patient with history of HIV. 4. Aortic Atherosclerosis (ICD10-I70.0) and Emphysema (ICD10-J43.9).     Electronically Signed   By: Randa Ngo M.D.   On: 11/09/2021 18:51      Past Medical History:   has a past medical history of Depression and HIV infection ([place]).   has no past surgical history on file.   Significant Hospital Events:  11/09/2021 - admit   Interim History / Subjective:   11/13/2021 - now on IV heparin gtt. Awaiing bronch.  Intermittent fevers +. HR 103. BP ok. 100% RA. Last dose xarelto 11/11/21 AT 8AM. No issues per RN. Overall stable  Objective   Blood pressure 112/75, pulse (!) 104, temperature 99.9 F (37.7 C), temperature source Oral, resp. rate 14, height _0  (1.676 m), weight 67.9 kg, SpO2 100 %.        Intake/Output Summary (Last 24 hours) at 11/13/2021 1116 Last data filed at 11/13/2021 0925 Gross per 24 hour  Intake 759.63 ml  Output 650 ml  Net 109.63 ml   Filed Weights   11/09/21 2040  Weight: 67.9 kg    Examination: General: emaciated male. Pleasant. No distress. Sleeping. Had BF HENT: resting/sleeping Lungs: CTA bilaterally. No distress.  Cardiovascular: RRR Abdomen: abd soft Extremities: intact Neuro: sleeping. Rt sided weakness GU: not examined  Resolved Hospital Problem list   x  Assessment & Plan:    HIV/AIDS with low CD4 and MAC    PE - end Jan 2023   Mediastinal ADenopathy  - Left hilar masss  4.2 x 2.4 cm   - Right hilar adenopathy measuring up to 1.5 cm  - Subcarinal lymph node measuring up to 1.4 cm in short axis.  Lung nodule -   - Left Upper Lobe 1cm  - Left IUpper Lobe  9pmm - RUL subpleural 3.6cm (with surrounding bullous emphysematous changes)  P:   Mediastinal adenopathy - EBUS  LUL nodule 1cm and 56m -> BAL +/- TTBX  RUL subpleural nodule -> only BAL . No bx due to surrounding emphysema)    NPO from 23.50 11/13/21  IV heparin as part of pre-procedure  - ongoing. To be held 1-2h pre--procedure  Case requiest for 11/14/21 made - Dr AElsworth Sohothe proceduralist aware. Wil lonely know on 11/14/21 the true date of procdure   D/w patient  11/11/21- Risks of pneumothorax, hemothorax, sedation/anesthesia complications such as cardiac or respiratory arrest or hypotension, stroke and bleeding all explained. Benefits of diagnosis but limitations of non-diagnosis also explained. Patient verbalized understanding and wished to proceed.    Best practice (daily eval):  Per triad   Goals of Care:    Family Updates 11/11/21 : his mom CLuman Holway-[phone] PAtient said we can tell mom about his HIV/AIDS etc.,        SIGNATURE    Dr. MBrand Males M.D., F.C.C.P,  Pulmonary and Critical Care Medicine Staff Physician, CGrangerDirector - Interstitial Lung Disease  Program  [place] [address] NPI Number:  NPI ##[id number] Pager: [phone] If no answer  -> Check AMION or Try 3HorineTelephone (clinical office): [phone] Telephone (research):  [phone]  11:16 AM 11/13/2021   11/13/2021 11:16 AM    LABS    PULMONARY Recent Labs  Lab 11/09/21 1600  TCO2 20*    CBC Recent Labs  Lab 11/11/21 0611 11/12/21 0112 11/13/21 0248  HGB 9.5* 9.3* 8.7*  HCT 30.7* 29.5* 27.8*  WBC 5.9 6.6 4.9  PLT 326 370 383    COAGULATION Recent Labs  Lab 11/09/21 1541  INR 2.3*    CARDIAC  No results for input(s): TROPONINI in the last 168 hours. No results for input(s): PROBNP in the last 168 hours.  CHEMISTRY Recent Labs  Lab  11/09/21 1541 11/09/21 1600 11/10/21 0502 11/11/21 0611 11/12/21 0112  NA 137 131* 136 139  --   K 4.9 6.3* 4.2 4.2  --   CL 105 107 108 109  --   CO2 19*  --  17* 21*  --   GLUCOSE 99 91 104* 112*  --   BUN 20 25* 13 13  --   CREATININE 1.34* 1.20 1.05 1.10  --   CALCIUM 11.2*  --  10.4* 9.7  --   MG  --   --   --   --  2.0  PHOS  --   --   --   --  4.7*   Estimated Creatinine Clearance: 74.9 mL/min (by C-G formula based on SCr of 1.1 mg/dL).   LIVER Recent Labs  Lab 11/09/21 1541 11/10/21 0502 11/11/21 0611  AST 77* 67* 59*  ALT 115* 90* 77*  ALKPHOS 344* 285* 280*  BILITOT 0.5 0.8 0.4  PROT 9.5* 7.8 7.2  ALBUMIN 3.6 3.1* 2.8*  INR 2.3*  --   --      INFECTIOUS Recent Labs  Lab 11/09/21 1541 11/09/21 1831 11/10/21 0502 11/11/21 0611  LATICACIDVEN 2.6* 1.6  --   --   PROCALCITON 0.56  --  0.46 0.36     ENDOCRINE CBG (last 3)  No results for input(s): GLUCAP in the last 72 hours.        IMAGING x48h  - image(s) personally visualized  -   highlighted in bold No results found.

## 2021-11-13 NOTE — Progress Notes (Addendum)
ANTICOAGULATION CONSULT NOTE   Pharmacy Consult for Xarelto to IV heparin Indication: history of PE/DVT  Allergies  Allergen Reactions   Lidocaine Other (See Comments)    Large bruise at site of patch placement    Patient Measurements: Height: 5\' 6"  (167.6 cm) Weight: 67.9 kg (149 lb 11.1 oz) IBW/kg (Calculated) : 63.8 Heparin Dosing Weight: 67.9 kg  Vital Signs: Temp: 99.9 F (37.7 C) (06/04 0957) Temp Source: Oral (06/04 0957) BP: 112/75 (06/04 1001) Pulse Rate: 104 (06/04 1001)  Labs: Recent Labs    11/11/21 01/11/22 11/11/21 1713 11/12/21 0112 11/12/21 0748 11/12/21 1756 11/13/21 0248 11/13/21 1118  HGB 9.5*  --  9.3*  --   --  8.7*  --   HCT 30.7*  --  29.5*  --   --  27.8*  --   PLT 326  --  370  --   --  383  --   APTT  --   --  91*   < > 61* 56* 84*  HEPARINUNFRC  --  >1.10* >1.10*  --   --  0.86*  --   CREATININE 1.10  --   --   --   --   --   --    < > = values in this interval not displayed.     Estimated Creatinine Clearance: 74.9 mL/min (by C-G formula based on SCr of 1.1 mg/dL).   Medical History: Past Medical History:  Diagnosis Date   Depression    HIV infection (HCC)    Assessment: 46 yo male presenting with shortness of breath.  PMH includes submassive PE and + DVT diagnosed 10/29/21 on Xarelto 15mg  PO BID and mediastinal adenopathy.  PCCM is consulted to perform bronchoscopy tentatively planned on 6/5 or 6/6.  Pharmacy has been consulted to transition Xarelto to IV heparin in preparation for bronchoscopy. Last dose of Xarelto was 6/2 at 0759.    Baseline coag labs drawn 5/31: aPTT 55, INR 2.3, Hgb 11.8  11/13/2021: 1118 aPTT = 84 seconds, now therapeutic  AM Heparin level 0.86 units/mL - elevated due to recent Xarelto use, but trending down as anticipated CBC: Hgb low , trending down; Pltc WNL No bleeding or infusion related concerns per RN No interruptions in therapy per RN  Goal of Therapy:  Heparin level 0.3-0.7 units/ml aPTT 66-102  seconds Monitor platelets by anticoagulation protocol: Yes Heparin level is falsely elevated due to recent Xarelto admin - plan to dose IV heparin using aPTT until level is correlating with heparin level.    Plan:  Continue heparin infusion at currently ordered rate of 1350 units/hr  Check confirmatory aPTT at 1800 to ensure remains within therapeutic range  Daily CBC, heparin level, aPTT Monitor closely for s/sx of bleeding Noted plans for bronchoscopy on 6/5 or 6/6 - per PCCM, heparin will need to be held 1-2 hours prior to procedure. Will need to confirm actual timing of procedure on Monday.    01/13/2022, PharmD, BCPS Clinical Pharmacist  11/13/2021 12:33 PM   Addendum: 1758 aPTT = 71 seconds, remains therapeutic  Plan: Continue heparin infusion at 1350 units/hr Daily CBC, heparin level, aPTT   Greer Pickerel, PharmD, BCPS Clinical Pharmacist  11/13/2021 6:25 PM

## 2021-11-13 NOTE — Progress Notes (Signed)
The rate of Heparin gtt has been changed from 12 to 13.5  and Heparin bolus given per order. Will continue to monitor any bleeding issues.

## 2021-11-13 NOTE — Plan of Care (Signed)

## 2021-11-13 NOTE — Progress Notes (Signed)
ANTICOAGULATION CONSULT NOTE   Pharmacy Consult for Xarelto to IV heparin Indication: history of PE/DVT  Allergies  Allergen Reactions   Lidocaine Other (See Comments)    Large bruise at site of patch placement    Patient Measurements: Height: 5\' 6"  (167.6 cm) Weight: 67.9 kg (149 lb 11.1 oz) IBW/kg (Calculated) : 63.8 Heparin Dosing Weight: 67.9 kg  Vital Signs: Temp: 99.4 F (37.4 C) (06/04 0438) Temp Source: Oral (06/04 0438) BP: 112/73 (06/04 0438) Pulse Rate: 98 (06/04 0438)  Labs: Recent Labs    11/10/21 0502 11/11/21 01/11/22 11/11/21 01/11/22 11/11/21 1713 11/12/21 0112 11/12/21 0748 11/12/21 1756 11/13/21 0248  HGB 10.0* 9.5*  --   --  9.3*  --   --  8.7*  HCT 31.8* 30.7*  --   --  29.5*  --   --  27.8*  PLT 358 326  --   --  370  --   --  383  APTT  --   --    < >  --  91* 69* 61* 56*  HEPARINUNFRC  --   --   --  >1.10* >1.10*  --   --  0.86*  CREATININE 1.05 1.10  --   --   --   --   --   --    < > = values in this interval not displayed.     Estimated Creatinine Clearance: 74.9 mL/min (by C-G formula based on SCr of 1.1 mg/dL).   Medical History: Past Medical History:  Diagnosis Date   Depression    HIV infection (HCC)    Assessment: 46 yo male presenting with shortness of breath.  PMH includes submassive PE and + DVT diagnosed 10/29/21 on Xarelto 15mg  PO BID and mediastinal adenopathy.  PCCM is consulted to perform bronchoscopy tentatively planned on 6/5 or 6/6.  Pharmacy has been consulted to transition Xarelto to IV heparin in preparation for bronchoscopy. Last dose of Xarelto was 6/2 at 0759.    Baseline coag labs drawn 5/31: aPTT 55, INR 2.3, Hgb 11.8  11/13/2021: aPTT = 56 seconds, subtherapeutic & continues to trend down despite rate increases Heparin level 0.86- elevated due to recent Xarelto use but trending down as anticipated CBC: Hgb low , trending down; Pltc WNL No bleeding or infusion related concerns per RN No interruptions in therapy  per RN  Goal of Therapy:  Heparin level 0.3-0.7 units/ml aPTT 66-102 seconds Monitor platelets by anticoagulation protocol: Yes Heparin level is falsely elevated at > 1.1 due to recent Xarelto admin- plan to dose IV heparin using aPTT until level is correlating with heparin level.    Plan:  Heparin 1500 units IV x1 then increase heparin infusion to 1300 units/hr  Check aPTT 6 hours after rate change Daily CBC, heparin level, aPTT Monitor closely for s/sx of bleeding Noted plans for bronchoscopy on 6/5-->f/u with pulm re: need to hold heparin prior to procedure  6/31, PharmD, BCPS 11/13/2021 4:48 AM

## 2021-11-14 ENCOUNTER — Inpatient Hospital Stay (HOSPITAL_COMMUNITY): Payer: Medicaid Other | Admitting: Anesthesiology

## 2021-11-14 ENCOUNTER — Encounter (HOSPITAL_COMMUNITY): Payer: Self-pay | Admitting: Internal Medicine

## 2021-11-14 ENCOUNTER — Ambulatory Visit: Payer: Medicaid Other | Admitting: Physical Therapy

## 2021-11-14 ENCOUNTER — Inpatient Hospital Stay (HOSPITAL_COMMUNITY): Payer: Medicaid Other

## 2021-11-14 ENCOUNTER — Encounter (HOSPITAL_COMMUNITY)
Admission: EM | Disposition: A | Discharge status: Home / Self Care | Payer: Self-pay | Source: Home / Self Care | Attending: Family Medicine

## 2021-11-14 ENCOUNTER — Ambulatory Visit: Payer: Medicaid Other | Admitting: Occupational Therapy

## 2021-11-14 DIAGNOSIS — I10 Essential (primary) hypertension: Secondary | ICD-10-CM

## 2021-11-14 DIAGNOSIS — B20 Human immunodeficiency virus [HIV] disease: Secondary | ICD-10-CM | POA: Diagnosis not present

## 2021-11-14 DIAGNOSIS — A310 Pulmonary mycobacterial infection: Secondary | ICD-10-CM

## 2021-11-14 DIAGNOSIS — R918 Other nonspecific abnormal finding of lung field: Secondary | ICD-10-CM

## 2021-11-14 DIAGNOSIS — J449 Chronic obstructive pulmonary disease, unspecified: Secondary | ICD-10-CM

## 2021-11-14 DIAGNOSIS — R590 Localized enlarged lymph nodes: Secondary | ICD-10-CM

## 2021-11-14 DIAGNOSIS — N179 Acute kidney failure, unspecified: Secondary | ICD-10-CM | POA: Diagnosis not present

## 2021-11-14 DIAGNOSIS — A812 Progressive multifocal leukoencephalopathy: Secondary | ICD-10-CM

## 2021-11-14 DIAGNOSIS — J189 Pneumonia, unspecified organism: Secondary | ICD-10-CM | POA: Diagnosis not present

## 2021-11-14 DIAGNOSIS — R6510 Systemic inflammatory response syndrome (SIRS) of non-infectious origin without acute organ dysfunction: Secondary | ICD-10-CM | POA: Diagnosis not present

## 2021-11-14 DIAGNOSIS — J439 Emphysema, unspecified: Secondary | ICD-10-CM | POA: Diagnosis not present

## 2021-11-14 DIAGNOSIS — Z87891 Personal history of nicotine dependence: Secondary | ICD-10-CM

## 2021-11-14 DIAGNOSIS — I2699 Other pulmonary embolism without acute cor pulmonale: Secondary | ICD-10-CM | POA: Diagnosis not present

## 2021-11-14 HISTORY — PX: BRONCHIAL WASHINGS: SHX5105

## 2021-11-14 HISTORY — PX: BRONCHIAL NEEDLE ASPIRATION BIOPSY: SHX5106

## 2021-11-14 HISTORY — PX: ENDOBRONCHIAL ULTRASOUND: SHX5096

## 2021-11-14 HISTORY — PX: VIDEO BRONCHOSCOPY: SHX5072

## 2021-11-14 HISTORY — PX: BRONCHIAL BRUSHINGS: SHX5108

## 2021-11-14 LAB — CBC
HCT: 31.1 % — ABNORMAL LOW (ref 39.0–52.0)
Hemoglobin: 9.7 g/dL — ABNORMAL LOW (ref 13.0–17.0)
MCH: 28 pg (ref 26.0–34.0)
MCHC: 31.2 g/dL (ref 30.0–36.0)
MCV: 89.6 fL (ref 80.0–100.0)
Platelets: 413 10*3/uL — ABNORMAL HIGH (ref 150–400)
RBC: 3.47 MIL/uL — ABNORMAL LOW (ref 4.22–5.81)
RDW: 16.3 % — ABNORMAL HIGH (ref 11.5–15.5)
WBC: 5.1 10*3/uL (ref 4.0–10.5)
nRBC: 0 % (ref 0.0–0.2)

## 2021-11-14 LAB — COMPREHENSIVE METABOLIC PANEL
ALT: 72 U/L — ABNORMAL HIGH (ref 0–44)
AST: 60 U/L — ABNORMAL HIGH (ref 15–41)
Albumin: 3.1 g/dL — ABNORMAL LOW (ref 3.5–5.0)
Alkaline Phosphatase: 270 U/L — ABNORMAL HIGH (ref 38–126)
Anion gap: 11 (ref 5–15)
BUN: 13 mg/dL (ref 6–20)
CO2: 18 mmol/L — ABNORMAL LOW (ref 22–32)
Calcium: 9.7 mg/dL (ref 8.9–10.3)
Chloride: 109 mmol/L (ref 98–111)
Creatinine, Ser: 0.83 mg/dL (ref 0.61–1.24)
GFR, Estimated: >60 mL/min (ref >60)
Glucose, Bld: 98 mg/dL (ref 70–99)
Potassium: 4.4 mmol/L (ref 3.5–5.1)
Sodium: 138 mmol/L (ref 135–145)
Total Bilirubin: 0.3 mg/dL (ref 0.3–1.2)
Total Protein: 8.1 g/dL (ref 6.5–8.1)

## 2021-11-14 LAB — CULTURE, BLOOD (ROUTINE X 2)
Culture: NO GROWTH
Culture: NO GROWTH

## 2021-11-14 LAB — APTT: aPTT: 47 seconds — ABNORMAL HIGH (ref 24–36)

## 2021-11-14 LAB — HEPARIN LEVEL (UNFRACTIONATED): Heparin Unfractionated: 0.28 IU/mL — ABNORMAL LOW (ref 0.30–0.70)

## 2021-11-14 LAB — FLUORESCENT TREPONEMAL AB(FTA)-IGG-BLD: Fluorescent Treponemal Ab, IgG: NONREACTIVE

## 2021-11-14 SURGERY — ENDOBRONCHIAL ULTRASOUND (EBUS)
Anesthesia: General | Laterality: Right

## 2021-11-14 MED ORDER — LACTATED RINGERS IV SOLN: INTRAVENOUS | Status: DC

## 2021-11-14 MED ORDER — HYDROCOD POLI-CHLORPHE POLI ER 10-8 MG/5ML PO SUER
5.0000 mL | Freq: Two times a day (BID) | ORAL | Status: DC | PRN
  Administered 2021-11-14 – 2021-11-26 (×4): 5 mL via ORAL
  Filled 2021-11-14 (×4): qty 5

## 2021-11-14 MED ORDER — FENTANYL CITRATE (PF) 100 MCG/2ML IJ SOLN: 25.0000 ug | INTRAMUSCULAR | Status: DC | PRN

## 2021-11-14 MED ORDER — PROPOFOL 10 MG/ML IV BOLUS
INTRAVENOUS | Status: DC | PRN
  Administered 2021-11-14: 150 mg via INTRAVENOUS

## 2021-11-14 MED ORDER — SUCCINYLCHOLINE CHLORIDE 200 MG/10ML IV SOSY
PREFILLED_SYRINGE | INTRAVENOUS | Status: DC | PRN
  Administered 2021-11-14: 110 mg via INTRAVENOUS

## 2021-11-14 MED ORDER — FENTANYL CITRATE (PF) 100 MCG/2ML IJ SOLN
INTRAMUSCULAR | Status: AC
  Filled 2021-11-14: qty 2

## 2021-11-14 MED ORDER — SUGAMMADEX SODIUM 200 MG/2ML IV SOLN
INTRAVENOUS | Status: DC | PRN
  Administered 2021-11-14: 135.8 mg via INTRAVENOUS

## 2021-11-14 MED ORDER — MIDAZOLAM HCL 5 MG/5ML IJ SOLN
INTRAMUSCULAR | Status: DC | PRN
  Administered 2021-11-14: 1 mg via INTRAVENOUS

## 2021-11-14 MED ORDER — MIDAZOLAM HCL 2 MG/2ML IJ SOLN
INTRAMUSCULAR | Status: AC
  Filled 2021-11-14: qty 2

## 2021-11-14 MED ORDER — HEPARIN (PORCINE) 25000 UT/250ML-% IV SOLN
1700.0000 [IU]/h | INTRAVENOUS | Status: DC
  Administered 2021-11-14: 1450 [IU]/h via INTRAVENOUS
  Administered 2021-11-15 – 2021-11-16 (×2): 1550 [IU]/h via INTRAVENOUS
  Filled 2021-11-14 (×3): qty 250

## 2021-11-14 MED ORDER — ONDANSETRON HCL 4 MG/2ML IJ SOLN
4.0000 mg | Freq: Once | INTRAMUSCULAR | Status: AC | PRN
  Administered 2021-11-27: 4 mg via INTRAVENOUS
  Filled 2021-11-14: qty 2

## 2021-11-14 MED ORDER — PHENYLEPHRINE HCL (PRESSORS) 10 MG/ML IV SOLN
INTRAVENOUS | Status: DC | PRN
  Administered 2021-11-14: 40 ug via INTRAVENOUS
  Administered 2021-11-14: 80 ug via INTRAVENOUS

## 2021-11-14 MED ORDER — DEXAMETHASONE SODIUM PHOSPHATE 10 MG/ML IJ SOLN
INTRAMUSCULAR | Status: DC | PRN
  Administered 2021-11-14: 4 mg via INTRAVENOUS

## 2021-11-14 MED ORDER — MORPHINE SULFATE (PF) 2 MG/ML IV SOLN
1.0000 mg | INTRAVENOUS | Status: DC | PRN
  Administered 2021-11-15 – 2021-11-22 (×18): 2 mg via INTRAVENOUS
  Administered 2021-11-23: 1 mg via INTRAVENOUS
  Administered 2021-11-23 – 2021-11-25 (×4): 2 mg via INTRAVENOUS
  Filled 2021-11-14 (×24): qty 1

## 2021-11-14 MED ORDER — ONDANSETRON HCL 4 MG/2ML IJ SOLN
INTRAMUSCULAR | Status: DC | PRN
  Administered 2021-11-14: 4 mg via INTRAVENOUS

## 2021-11-14 MED ORDER — ROCURONIUM BROMIDE 10 MG/ML (PF) SYRINGE
PREFILLED_SYRINGE | INTRAVENOUS | Status: DC | PRN
  Administered 2021-11-14: 20 mg via INTRAVENOUS
  Administered 2021-11-14: 10 mg via INTRAVENOUS

## 2021-11-14 MED ORDER — FENTANYL CITRATE (PF) 250 MCG/5ML IJ SOLN
INTRAMUSCULAR | Status: DC | PRN
  Administered 2021-11-14: 50 ug via INTRAVENOUS
  Administered 2021-11-14 (×2): 25 ug via INTRAVENOUS

## 2021-11-14 NOTE — Anesthesia Procedure Notes (Signed)
Procedure Name: Intubation Date/Time: 11/14/2021 2:25 PM Performed by: Garrel Ridgel, CRNA Pre-anesthesia Checklist: Patient identified, Emergency Drugs available, Suction available and Patient being monitored Patient Re-evaluated:Patient Re-evaluated prior to induction Oxygen Delivery Method: Circle system utilized Preoxygenation: Pre-oxygenation with 100% oxygen Induction Type: IV induction Ventilation: Mask ventilation without difficulty Laryngoscope Size: Mac and 3 Grade View: Grade I Tube type: Oral Tube size: 9.0 mm Number of attempts: 1 Airway Equipment and Method: Stylet and Oral airway Placement Confirmation: ETT inserted through vocal cords under direct vision, positive ETCO2 and breath sounds checked- equal and bilateral Secured at: 23 cm Dental Injury: Teeth and Oropharynx as per pre-operative assessment

## 2021-11-14 NOTE — Interval H&P Note (Signed)
History and Physical Interval Note:  11/14/2021 2:18 PM  Kurt Foley  has presented today for surgery, with the diagnosis of AIDS, Mediastinal/ Hilar Adenopathy,  Rt lung lavage, Fevers.  The various methods of treatment have been discussed with the patient and family. After consideration of risks, benefits and other options for treatment, the patient has consented to  Procedure(s): ENDOBRONCHIAL ULTRASOUND (Bilateral) VIDEO BRONCHOSCOPY WITH FLUORO (Right) as a surgical intervention.  The patient's history has been reviewed, patient examined, no change in status, stable for surgery.  I have reviewed the patient's chart and labs.  Questions were answered to the patient's satisfaction.     Leanna Sato Elsworth Soho

## 2021-11-14 NOTE — Op Note (Signed)
  Name:  [name] MRN:  [id number] DOB:  01/19/1976  PROCEDURE NOTE  Procedure(s): Flexible bronchoscopy [phone]) Brushing [phone]) of the RUL  Bronchial alveolar lavage (49324) of the RUL Endobronchial ultrasound (19914) Transbronchial needle aspiration (44584) of the station 7    Indications:  Hilar / mediastinal lymphadenopathy, RUL lung mass  Consent:  Written informed consent was obtained prior to the procedure. The risks of the procedure including coughing, bleeding and the small chance of lung puncture requiring chest tube were discussed in great detail. The benefits & alternatives including serial follow up were also discussed.  Anesthesia:  General endotracheal.  Procedure summary:  Appropriate equipment was assembled.  The patient was  identified as Kurt Foley. Interim history obtained and brought to the operating room. Safety timeout was performed. The patient was placed supine on the operating table, airway established and general anesthesia administered by Anesthesia team.   After the appropriate level of anesthesia was assured, flexible video bronchoscope was lubricated and inserted through the endotracheal tube.    Airway examination was performed bilaterally to subsegmental level.  Minimal clear secretions were noted, mucosa appeared normal and no endobronchial lesions were identified. Brushings under fluoro & BAL obtained from RUL sub segment  Endobronchial ultrasound video bronchoscope was then lubricated and inserted through the endotracheal tube. Surveillance of the mediastinal and and bilateral hilar lymph node stations was performed.  Pathologically enlarged lymph nodes were noted at station 7 & 11R.  Endobronchial ultrasound guided transbronchial needle aspiration of station 7  (passes x 5), was performed, after which EBUS bronchoscope was withdrawn.    After ensuring hemostasis , the bronchoscope was withdrawn.  The patient was extubated in  endoscopy and transferred to recovery area. Post-procedure chest x-ray was ordered.  Specimens sent: Bronchial alveolar lavage specimen of the RUL for  microbiology and cytology. Brushings for cytology [facility] station 7 for cytology  Complications:  No immediate complications were noted.  Hemodynamic parameters and oxygenation remained stable throughout the procedure.  Estimated blood loss:  Less then 5 mL.   Kara Mead MD. Shade Flood. [facility] Pager [phone] If no response call [phone]   11/14/2021 3:21 PM

## 2021-11-14 NOTE — H&P (View-Only) (Signed)
NAME:  Kurt Foley, MRN:  [id number], DOB:  1975-07-19, LOS: 4 ADMISSION DATE:  11/09/2021, CONSULTATION DATE:  11/11/21 REFERRING MD:  Dr Kurt Foley, CHIEF COMPLAINT:  AID with lung mass, nodules, mediastinal adenopathy. Recent PE   BRIEF  -46 year old patient with advanced AIDS.,  Substance abuse, depression disseminated MAC, prior CVA with residual right-sided weakness recent admission first on 10/06/2021 - 10/07/2021 with dry cough and fever.  Pulmonary embolism negative.  But had progressive masslike peripheral right upper lobe opacity subpleural.  Then again 10/29/2021 - 11/02/2021 with bilateral pulmonary embolism carpal mild treated with the systemic anticoagulation discharged on Xarelto.  Set up to see Dr. Leory Plowman Foley for evaluation of bronchoscopy mid June 2023 but readmitted 11/09/2021 with 2 to 3 days of painful cough palpitations decreased appetite.  Tachycardic.  Mild elevation lactic acid 2.6.  Infectious disease does not think this is a viral syndrome..  Repeat CT scan of the chest confirms the abnormal pulmonary parenchymal findings and mediastinal/hilar/subcarinal adenopathy.  History also reveals that he has missed 2 months of MAC treatment.  No dyspnea or fever.  Infectious disease requesting tissue sampling of the lung masses and also mediastinal adenopathy for pathology and culture.  Therefore pulmonary consulted.  Patient currently on Xarelto.    Previous recent ID workup 5/31 bcx negative to date 5/31 urine cx <10k insignificant growth; no urinary sx 4/27 hepatitis panel negative 07/2021 crypto ag negative; blasto ag negative CT chest 11/09/21    Past Medical History:   has a past medical history of Depression and HIV infection ([place]).    Significant Hospital Events:  11/09/2021 - admit   Interim History / Subjective:   Lying supine in bed, no distress. Afebrile, on room air. Tachycardic   Objective   Blood pressure 117/83, pulse (!) 113, temperature 98.6 F (37 C),  temperature source Axillary, resp. rate (!) 22, height _0  (1.676 m), weight 67.9 kg, SpO2 95 %.        Intake/Output Summary (Last 24 hours) at 11/14/2021 1043 Last data filed at 11/14/2021 1000 Gross per 24 hour  Intake 395 ml  Output 1500 ml  Net -1105 ml    Filed Weights   11/09/21 2040  Weight: 67.9 kg    Examination: General: emaciated male. Pleasant. No distress.  HENT: Mild pallor, no icterus Lungs: Clear to auscultation, no accessory muscle use Cardiovascular: RRR Abdomen: abd soft Extremities: intact Neuro: Right-sided weakness, alert and interactive GU: not examined  Resolved Hospital Problem list   x  Assessment & Plan:    HIV/AIDS with low CD4 and MAC    PE - end Jan 2023   Mediastinal ADenopathy  - Left hilar masss  4.2 x 2.4 cm   - Right hilar adenopathy measuring up to 1.5 cm  - Subcarinal lymph node measuring up to 1.4 cm in short axis. Lymphadenopathy appears to be regressing compared to previous imaging  Lung nodule -   - Left Upper Lobe 1cm  - Left IUpper Lobe 9pmm - RUL subpleural 3.6cm (with surrounding bullous emphysematous changes)  P:   Plan for bronchoscopy today with: Mediastinal adenopathy - EBUS  LUL nodule 1cm and 64m -> BAL +/- TTBX  RUL subpleural nodule -> only BAL . No bx due to surrounding emphysema)    IV heparin has been held Risks of the procedure explained to patient including that of bleeding, lung puncture requiring chest tube and that of general anesthesia.  Discussed with ID   Best practice (  daily eval):  Per triad   Goals of Care:    Family Updates 11/11/21 : his mom Kurt Foley [phone]. PAtient said we can tell mom about his HIV/AIDS etc.,   Kurt Mead MD. FCCP. [facility] Pager : [phone]  If no response to pager , please call [phone] until 7 pm After 7:00 pm call Elink  [phone]   11/14/2021

## 2021-11-14 NOTE — Anesthesia Postprocedure Evaluation (Signed)
Anesthesia Post Note  Patient: Kurt Foley  Procedure(s) Performed: ENDOBRONCHIAL ULTRASOUND (Bilateral) VIDEO BRONCHOSCOPY WITH FLUORO (Right) BRONCHIAL WASHINGS - RUL BRONCHIAL BRUSHINGS - RUL BRONCHIAL NEEDLE ASPIRATION BIOPSIES - STATION 7     Patient location during evaluation: PACU Anesthesia Type: General Level of consciousness: awake and alert and oriented Pain management: pain level controlled Vital Signs Assessment: post-procedure vital signs reviewed and stable Respiratory status: spontaneous breathing, nonlabored ventilation and respiratory function stable Cardiovascular status: blood pressure returned to baseline and stable Postop Assessment: no apparent nausea or vomiting Anesthetic complications: no   No notable events documented.  Last Vitals:  Vitals:   11/14/21 1548 11/14/21 1550  BP:  118/74  Pulse:  (!) 120  Resp:  (!) 28  Temp:    SpO2: 95% 95%    Last Pain:  Vitals:   11/14/21 1540  TempSrc:   PainSc: 0-No pain                 Kia Stavros A.

## 2021-11-14 NOTE — Progress Notes (Signed)
   11/14/21 0545  Vitals  Temp 98.6 F (37 C)  Temp Source Axillary  BP 117/83  MAP (mmHg) 95  BP Location Left Arm  BP Method Automatic  Patient Position (if appropriate) Lying  Pulse Rate (!) 113  Pulse Rate Source Monitor  Resp (!) 22  MEWS COLOR  MEWS Score Color Yellow  Oxygen Therapy  SpO2 95 %  O2 Device Room Air    Pt was previously Yellow MEWS d/t HR and resp and is back to yellow MEWS again. Pt is sleeping and is currently in NPO. Notified to charge nurse and will take Q4 vitals and continue to monitor.

## 2021-11-14 NOTE — Anesthesia Preprocedure Evaluation (Addendum)
Anesthesia Evaluation  Patient identified by MRN, date of birth, ID band Patient awake    Reviewed: Allergy & Precautions, NPO status , Patient's Chart, lab work & pertinent test results, reviewed documented beta blocker date and time   Airway Mallampati: II  TM Distance: >3 FB Neck ROM: Full    Dental  (+) Edentulous Upper, Edentulous Lower   Pulmonary shortness of breath and with exertion, pneumonia, unresolved, COPD,  COPD inhaler, former smoker,  Hx/o MAI pneumonia   breath sounds clear to auscultation + decreased breath sounds      Cardiovascular hypertension, Pt. on medications and Pt. on home beta blockers Normal cardiovascular exam Rhythm:Regular Rate:Normal     Neuro/Psych PSYCHIATRIC DISORDERS Depression Right hemiparesis CVA, Residual Symptoms    GI/Hepatic negative GI ROS, (+)     substance abuse  cocaine use and marijuana use,   Endo/Other    Renal/GU Renal disease  negative genitourinary   Musculoskeletal negative musculoskeletal ROS (+)   Abdominal   Peds  Hematology  (+) Blood dyscrasia, anemia , HIV, AIDS   Anesthesia Other Findings   Reproductive/Obstetrics                           Anesthesia Physical Anesthesia Plan  ASA: 3  Anesthesia Plan: General   Post-op Pain Management: Minimal or no pain anticipated   Induction: Intravenous  PONV Risk Score and Plan: 2 and Treatment may vary due to age or medical condition, Ondansetron and Dexamethasone  Airway Management Planned: Oral ETT  Additional Equipment: None  Intra-op Plan:   Post-operative Plan: Extubation in OR  Informed Consent: I have reviewed the patients History and Physical, chart, labs and discussed the procedure including the risks, benefits and alternatives for the proposed anesthesia with the patient or authorized representative who has indicated his/her understanding and acceptance.     Dental  advisory given  Plan Discussed with: CRNA and Anesthesiologist  Anesthesia Plan Comments:         Anesthesia Quick Evaluation

## 2021-11-14 NOTE — Progress Notes (Signed)
The rate of Heparin drip  has been increased to 13.5 to 14.5 per order. No bleeding or infusion issues.

## 2021-11-14 NOTE — Progress Notes (Signed)
NAME:  Kurt Foley, MRN:  [id number], DOB:  August 12, 1975, LOS: 4 ADMISSION DATE:  11/09/2021, CONSULTATION DATE:  11/11/21 REFERRING MD:  Dr Wyline Copas, CHIEF COMPLAINT:  AID with lung mass, nodules, mediastinal adenopathy. Recent PE   BRIEF  -46 year old patient with advanced AIDS.,  Substance abuse, depression disseminated MAC, prior CVA with residual right-sided weakness recent admission first on 10/06/2021 - 10/07/2021 with dry cough and fever.  Pulmonary embolism negative.  But had progressive masslike peripheral right upper lobe opacity subpleural.  Then again 10/29/2021 - 11/02/2021 with bilateral pulmonary embolism carpal mild treated with the systemic anticoagulation discharged on Xarelto.  Set up to see Dr. Leory Plowman Icard for evaluation of bronchoscopy mid June 2023 but readmitted 11/09/2021 with 2 to 3 days of painful cough palpitations decreased appetite.  Tachycardic.  Mild elevation lactic acid 2.6.  Infectious disease does not think this is a viral syndrome..  Repeat CT scan of the chest confirms the abnormal pulmonary parenchymal findings and mediastinal/hilar/subcarinal adenopathy.  History also reveals that he has missed 2 months of MAC treatment.  No dyspnea or fever.  Infectious disease requesting tissue sampling of the lung masses and also mediastinal adenopathy for pathology and culture.  Therefore pulmonary consulted.  Patient currently on Xarelto.    Previous recent ID workup 5/31 bcx negative to date 5/31 urine cx <10k insignificant growth; no urinary sx 4/27 hepatitis panel negative 07/2021 crypto ag negative; blasto ag negative CT chest 11/09/21    Past Medical History:   has a past medical history of Depression and HIV infection ([place]).    Significant Hospital Events:  11/09/2021 - admit   Interim History / Subjective:   Lying supine in bed, no distress. Afebrile, on room air. Tachycardic   Objective   Blood pressure 117/83, pulse (!) 113, temperature 98.6 F (37 C),  temperature source Axillary, resp. rate (!) 22, height _0  (1.676 m), weight 67.9 kg, SpO2 95 %.        Intake/Output Summary (Last 24 hours) at 11/14/2021 1043 Last data filed at 11/14/2021 1000 Gross per 24 hour  Intake 395 ml  Output 1500 ml  Net -1105 ml    Filed Weights   11/09/21 2040  Weight: 67.9 kg    Examination: General: emaciated male. Pleasant. No distress.  HENT: Mild pallor, no icterus Lungs: Clear to auscultation, no accessory muscle use Cardiovascular: RRR Abdomen: abd soft Extremities: intact Neuro: Right-sided weakness, alert and interactive GU: not examined  Resolved Hospital Problem list   x  Assessment & Plan:    HIV/AIDS with low CD4 and MAC    PE - end Jan 2023   Mediastinal ADenopathy  - Left hilar masss  4.2 x 2.4 cm   - Right hilar adenopathy measuring up to 1.5 cm  - Subcarinal lymph node measuring up to 1.4 cm in short axis. Lymphadenopathy appears to be regressing compared to previous imaging  Lung nodule -   - Left Upper Lobe 1cm  - Left IUpper Lobe 9pmm - RUL subpleural 3.6cm (with surrounding bullous emphysematous changes)  P:   Plan for bronchoscopy today with: Mediastinal adenopathy - EBUS  LUL nodule 1cm and 58m -> BAL +/- TTBX  RUL subpleural nodule -> only BAL . No bx due to surrounding emphysema)    IV heparin has been held Risks of the procedure explained to patient including that of bleeding, lung puncture requiring chest tube and that of general anesthesia.  Discussed with ID   Best practice (  daily eval):  Per triad   Goals of Care:    Family Updates 11/11/21 : his mom Kurt Foley [phone]. PAtient said we can tell mom about his HIV/AIDS etc.,   Kara Mead MD. FCCP. [facility] Pager : [phone]  If no response to pager , please call [phone] until 7 pm After 7:00 pm call Elink  [phone]   11/14/2021

## 2021-11-14 NOTE — Progress Notes (Signed)
[facility]   Reason for visit: Follow up on lung mass, HIV  Interval History: plan for BAL today    Physical Exam: Constitutional:  Vitals:   11/14/21 0545 11/14/21 1233  BP: 117/83 106/78  Pulse: (!) 113 (!) 114  Resp: (!) 22 (!) 27  Temp: 98.6 F (37 C) 98.7 F (37.1 C)  SpO2: 95% 100%   patient appears in NAD; chronically ill-appearing Respiratory: Normal respiratory effort; CTA B GI: soft, nt, nd  Review of Systems: Constitutional: negative for fevers and chills  Lab Results  Component Value Date   WBC 5.1 11/14/2021   HGB 9.7 (L) 11/14/2021   HCT 31.1 (L) 11/14/2021   MCV 89.6 11/14/2021   PLT 413 (H) 11/14/2021    Lab Results  Component Value Date   CREATININE 0.83 11/14/2021   BUN 13 11/14/2021   NA 138 11/14/2021   K 4.4 11/14/2021   CL 109 11/14/2021   CO2 18 (L) 11/14/2021    Lab Results  Component Value Date   ALT 72 (H) 11/14/2021   AST 60 (H) 11/14/2021   ALKPHOS 270 (H) 11/14/2021     Microbiology: Recent Results (from the past 240 hour(s))  Urine Culture     Status: Abnormal   Collection Time: 11/09/21  3:01 PM   Specimen: Urine, Clean Catch  Result Value Ref Range Status   Specimen Description   Final    URINE, CLEAN CATCH Performed at [facility], [place] [address], [place], [place] [address]    Special Requests   Final    NONE Performed at [facility], [place] [address] [place], [place] [address]    Culture (A)  Final    <10,000 COLONIES/mL INSIGNIFICANT GROWTH Performed at [facility] [address], [place], [place] [address]    Report Status 11/10/2021 FINAL  Final  SARS Coronavirus 2 by RT PCR (hospital order, performed in [facility] hospital lab) *cepheid single result test* Anterior Nasal Swab     Status: None   Collection Time: 11/09/21  3:01 PM   Specimen: Anterior Nasal Swab  Result Value Ref Range Status   SARS Coronavirus 2 by RT PCR  NEGATIVE NEGATIVE Final    Comment: (NOTE) SARS-CoV-2 target nucleic acids are NOT DETECTED.  The SARS-CoV-2 RNA is generally detectable in upper and lower respiratory specimens during the acute phase of infection. The lowest concentration of SARS-CoV-2 viral copies this assay can detect is 250 copies / mL. A negative result does not preclude SARS-CoV-2 infection and should not be used as the sole basis for treatment or other patient management decisions.  A negative result may occur with improper specimen collection / handling, submission of specimen other than nasopharyngeal swab, presence of viral mutation(s) within the areas targeted by this assay, and inadequate number of viral copies (<250 copies / mL). A negative result must be combined with clinical observations, patient history, and epidemiological information.  Fact Sheet for Patients:   https://www.patel.info/  Fact Sheet for Healthcare Providers: https://hall.com/  This test is not yet approved or  cleared by the [place] FDA and has been authorized for detection and/or diagnosis of SARS-CoV-2 by FDA under an Emergency Use Authorization (EUA).  This EUA will remain in effect (meaning this test can be used) for the duration of the COVID-19 declaration under Section 564(b)(1) of the Act, 21 U.S.C. section 360bbb-3(b)(1), unless the authorization is terminated or revoked sooner.  Performed  at [facility], [place] [address], [place], [place] [address]   Blood Culture (routine x 2)     Status: None   Collection Time: 11/09/21  3:38 PM   Specimen: BLOOD  Result Value Ref Range Status   Specimen Description   Final    BLOOD RIGHT ANTECUBITAL Performed at [facility] [address] [place], [place] [address]    Special Requests   Final    BOTTLES DRAWN AEROBIC AND ANAEROBIC Blood Culture results may not be optimal due to an inadequate  volume of blood received in culture bottles Performed at [facility] [address], [place], [place] [address]    Culture   Final    NO GROWTH 5 DAYS Performed at [facility], [place] [address], [place], [place] [address]    Report Status 11/14/2021 FINAL  Final  Blood Culture (routine x 2)     Status: None   Collection Time: 11/09/21  5:02 PM   Specimen: BLOOD  Result Value Ref Range Status   Specimen Description   Final    BLOOD SITE NOT SPECIFIED Performed at [facility] [address] [place], [place] [address]    Special Requests   Final    BOTTLES DRAWN AEROBIC AND ANAEROBIC Blood Culture results may not be optimal due to an inadequate volume of blood received in culture bottles Performed at [facility] [address] [place], [place] [address]    Culture   Final    NO GROWTH 5 DAYS Performed at [facility], [place] [address] [place], [place] [address]    Report Status 11/14/2021 FINAL  Final    Impression/Plan:  1. Lung mass - unknown etiology and he has MAI already known complicating this.  I discussed the case with Dr. Elsworth Soho who is doing the BAL today.  Will be able to do brushings and hopefully will find a diagnosis with this.  Otherwise may need IR-guided biopsy.  Will await results.   Discussed with the patient.   2.  MAI - on appropriate treatment, tolerating and will need to continue with this.   3.  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  No other treatment available for this.  Long term prognosis poor.    4.  Weakness - has PML and history of CVA and right sided weakness.  Has been in rehab but this has been complicated by multiple hospitalizations.    5.  HIV - good control now but continue poor immune recovery.  Off of bactrim prophylaxis and I agree this is less of an issue with a suppressed viral load.

## 2021-11-14 NOTE — Progress Notes (Addendum)
[facility] for Xarelto to IV heparin Indication: history of PE/DVT  Allergies  Allergen Reactions   Lidocaine Other (See Comments)    Large bruise at site of patch placement    Patient Measurements: Height: 5\' 6"  (167.6 cm) Weight: 67.9 kg (149 lb 11.1 oz) IBW/kg (Calculated) : 63.8 Heparin Dosing Weight: 67.9 kg  Vital Signs: Temp: 98.6 F (37 C) (06/05 0545) Temp Source: Axillary (06/05 0545) BP: 117/83 (06/05 0545) Pulse Rate: 113 (06/05 0545)  Labs: Recent Labs    11/11/21 0611 11/11/21 1713 11/12/21 0112 11/12/21 0748 11/13/21 0248 11/13/21 1118 11/13/21 1758 11/14/21 0434  HGB 9.5*  --  9.3*  --  8.7*  --   --  9.7*  HCT 30.7*  --  29.5*  --  27.8*  --   --  31.1*  PLT 326  --  370  --  383  --   --  413*  APTT  --   --  91*   < > 56* 84* 71* 47*  HEPARINUNFRC  --    < > >1.10*  --  0.86*  --   --  0.28*  CREATININE 1.10  --   --   --   --   --   --  0.83   < > = values in this interval not displayed.     Estimated Creatinine Clearance: 99.3 mL/min (by C-G formula based on SCr of 0.83 mg/dL).   Medical History: Past Medical History:  Diagnosis Date   Depression    HIV infection ([place])    Assessment: 46 yo male presenting with shortness of breath.  PMH includes submassive PE and + DVT diagnosed 10/29/21 on Xarelto 15mg  PO BID and mediastinal adenopathy.  PCCM is consulted to perform bronchoscopy tentatively planned on 6/5 or 6/6.  Pharmacy has been consulted to transition Xarelto to IV heparin in preparation for bronchoscopy. Last dose of Xarelto was 6/2 at 0759.    Baseline coag labs drawn 5/31: aPTT 55, INR 2.3, Hgb 11.8  11/14/2021: aPTT = 47 seconds, now subtherapeutic  AM Heparin level 0.28 units/mL - now subtherapeutic and correlating to aPTT CBC: Hgb low but improved; Pltc now slightly elevated No bleeding or infusion related concerns per RN No interruptions in therapy per RN  Goal of Therapy:  Heparin  level 0.3-0.7 aPTT 66-102 seconds Monitor platelets by anticoagulation protocol: Yes    Plan:  Increase heparin infusion to 1450 units/hr  Check heparin level in 6h.  Will d/c aPTT monitoring since levels correlating.  Daily CBC, heparin level Monitor closely for s/sx of bleeding Noted plans for bronchoscopy on 6/5 or 6/6 - per PCCM, heparin will need to be held 1-2 hours prior to procedure. Will need to confirm actual timing of procedure on Monday.   Netta Cedars, PharmD, BCPS Clinical Pharmacist  11/14/2021 5:52 AM

## 2021-11-14 NOTE — Progress Notes (Signed)
ANTICOAGULATION CONSULT NOTE   Pharmacy Consult for Xarelto to IV heparin Indication: history of PE/DVT  Allergies  Allergen Reactions   Lidocaine Other (See Comments)    Large bruise at site of patch placement    Patient Measurements: Height: 5\' 6"  (167.6 cm) Weight: 67.9 kg (149 lb 11.1 oz) IBW/kg (Calculated) : 63.8 Heparin Dosing Weight: 67.9 kg  Vital Signs: Temp: 98.5 F (36.9 C) (06/05 1531) Temp Source: Temporal (06/05 1531) BP: 102/87 (06/05 1621) Pulse Rate: 114 (06/05 1621)  Labs: Recent Labs    11/12/21 0112 11/12/21 0748 11/13/21 0248 11/13/21 1118 11/13/21 1758 11/14/21 0434  HGB 9.3*  --  8.7*  --   --  9.7*  HCT 29.5*  --  27.8*  --   --  31.1*  PLT 370  --  383  --   --  413*  APTT 91*   < > 56* 84* 71* 47*  HEPARINUNFRC >1.10*  --  0.86*  --   --  0.28*  CREATININE  --   --   --   --   --  0.83   < > = values in this interval not displayed.     Estimated Creatinine Clearance: 99.3 mL/min (by C-G formula based on SCr of 0.83 mg/dL).   Medical History: Past Medical History:  Diagnosis Date   Depression    HIV infection (H[facility])    Assessment: 46 yo male presenting with shortness of breath.  PMH includes submassive PE and + DVT diagnosed 10/29/21 on Xarelto 15mg  PO BID and mediastinal adenopathy.  PCCM is consulted to perform bronchoscopy 6/5.  Pharmacy has been consulted to transition Xarelto to IV heparin in preparation for bronchoscopy. Last dose of Xarelto was 6/2 at 0759.    11/14/2021: aPTT 47 this morning was subtherapeutic on heparin 1350 un/hr AM Heparin level 0.28 units/mL, subtherapeutic and correlating with aPTT CBC: Hgb low but improved; Pltc now slightly elevated Bronchoscopy with biopsy completed on 6/5 at 1515.  No major bleeding or complications reported.  Per Dr. 8/2, ok to resume heparin 4 hours post procedure.    Goal of Therapy:  Heparin level 0.3-0.7 aPTT 66-102 seconds Monitor platelets by anticoagulation protocol:  Yes    Plan:  Resume heparin infusion 1450 units/hr at 1915 Check heparin level in 6h.   Daily CBC, heparin level Monitor closely for s/sx of bleeding   01/14/2022 PharmD, BCPS [facility] main pharmacy [phone] 11/14/2021 4:31 PM

## 2021-11-14 NOTE — Progress Notes (Signed)
Progress Note   Patient: Kurt Foley JSE:[id number] DOB: 1975-11-22 DOA: 11/09/2021     4 DOS: the patient was seen and examined on 11/14/2021   Brief hospital course: 46 y.o. male with medical history significant of substance use, right upper lobe mass, HIV, PML, MAI, emphysema, CVA, PE, depression presenting with weakness and shortness of breath.  Patient recently admitted from 5/20 until 5/24 with shortness of breath which is multifactorial.  Noted to have DVT and PE with right heart strain but no right heart failure started discharged on Xarelto, also noted to have a right upper lobe mass due to be evaluated by bronchoscopy by pulmonology after discharge has not yet been done, also with history of MAI on ethambutol, also with HIV/AIDS with CD4 of 46 on suppressive antibiotics,   Assessment and Plan: No notes have been filed under this hospital service. Service: Hospitalist SIRS > Patient presenting with shortness of breath and tachycardia with heart rate in the 130s and respiratory rate in the 30s qualifying for SIRS criteria.   > Urinalysis is without infection and chest x-ray shows stable changes from previous was treated for possible pneumonia during recent admission and is covered with prophylactic antibiotics of Bactrim as well as being on ethambutol for MAI. >  Febrile this visit. Appreciate input by ID. Doubt pneumonia with recs to stop rocephin -ID had recommended eval by Pulmonary to consider biopsy/tissue of known lung mass, with concern for fungal vs MAC infection vs malignancy -Pulmonary following and pt is now s/p bronch today. F/u on biopsy   AKI > Creatinine elevated to 1.34 from baseline of 1.  Patient stated feeling dehydrated. > Renal function normalized with hydration -recheck bmet in AM   Pulmonary embolus > Diagnosed during recent admission.  - Currently on heparin gtt - Reviewed repeat CT chest. PE appears improved on CT   Right upper lobe mass > Noted  during recent admission, somewhat suspicious for cancer due to adenopathy as well.  -Pulm following and pt is now s/p bronch. F/u on cultures  History of CVA > Residual right hemiparesis - Continue home rosuvastatin  Emphysema - Replace home Symbicort with formulary Dulera - Continue home Incruse - Cont PRN albuterol  HIV MAI > Last CD4 count earlier this month was 46.  Currently on prophylactic Bactrim.  Is also on ethambutol for MAI. Hold for now as pt to start tx for CAP -Pt with hx PML noted - bactrim on hold for now per ID -Cont on azithro - Continue home Biktarvy  Substance use > History of polysubstance use including marijuana, cocaine, alcohol - Continue to monitor  Depression - Continue home Seroquel and mirtazapine  GERD - Continue home PPI   Transaminitis > Chronic transaminitis  with most recent LFT's stable with AST 60, ALT 72, alk phos 270. - Cont to follow LFT trends       Subjective: Complains of feeling "horrible" after bronch this afternoon. Actively coughing  Physical Exam: Vitals:   11/14/21 1540 11/14/21 1548 11/14/21 1550 11/14/21 1600  BP: 122/79  118/74 115/62  Pulse: (!) 126  (!) 120 (!) 124  Resp: (!) 23  (!) 28 (!) 26  Temp:      TempSrc:      SpO2: 91% 95% 95% 93%  Weight:      Height:       General exam: Conversant, in no acute distress Respiratory system: normal chest rise, actively coughing Cardiovascular system: regular rhythm, s1-s2 Gastrointestinal system: Nondistended,  nontender, pos BS Central nervous system: No seizures, no tremors Extremities: No cyanosis, no joint deformities Skin: No rashes, no pallor Psychiatry: Affect normal // no auditory hallucinations   Data Reviewed:  Labs reviewed: K 4.4, Cr 0.83, Hgb 9.7, AST 60, ALT 72  Family Communication: Pt in room, family not at bedside  Disposition: Status is: Inpatient Cont inpatient stay because: Severity of illness  Planned Discharge Destination:  Home    Author: Marylu Lund, MD 11/14/2021 4:09 PM  For on call review [email or web address].

## 2021-11-14 NOTE — Transfer of Care (Signed)
Immediate Anesthesia Transfer of Care Note  Patient: Kurt Foley  Procedure(s) Performed: ENDOBRONCHIAL ULTRASOUND (Bilateral) VIDEO BRONCHOSCOPY WITH FLUORO (Right) BRONCHIAL WASHINGS - RUL BRONCHIAL BRUSHINGS - RUL BRONCHIAL NEEDLE ASPIRATION BIOPSIES - STATION 7  Patient Location: Endoscopy Unit  Anesthesia Type:General  Level of Consciousness: drowsy and patient cooperative  Airway & Oxygen Therapy: Patient Spontanous Breathing and Patient connected to face mask oxygen  Post-op Assessment: Report given to RN and Post -op Vital signs reviewed and stable  Post vital signs: Reviewed and stable  Last Vitals:  Vitals Value Taken Time  BP 128/96 11/14/21 1531  Temp    Pulse 120 11/14/21 1532  Resp 25 11/14/21 1532  SpO2 100 % 11/14/21 1532  Vitals shown include unvalidated device data.  Last Pain:  Vitals:   11/14/21 1233  TempSrc: Temporal  PainSc: 0-No pain      Patients Stated Pain Goal: 3 (11/13/21 2012)  Complications: No notable events documented.

## 2021-11-15 ENCOUNTER — Encounter (HOSPITAL_COMMUNITY): Payer: Self-pay | Admitting: Pulmonary Disease

## 2021-11-15 DIAGNOSIS — R590 Localized enlarged lymph nodes: Secondary | ICD-10-CM | POA: Diagnosis not present

## 2021-11-15 DIAGNOSIS — A310 Pulmonary mycobacterial infection: Secondary | ICD-10-CM | POA: Diagnosis not present

## 2021-11-15 DIAGNOSIS — J439 Emphysema, unspecified: Secondary | ICD-10-CM | POA: Diagnosis not present

## 2021-11-15 DIAGNOSIS — B20 Human immunodeficiency virus [HIV] disease: Secondary | ICD-10-CM | POA: Diagnosis not present

## 2021-11-15 DIAGNOSIS — N179 Acute kidney failure, unspecified: Secondary | ICD-10-CM | POA: Diagnosis not present

## 2021-11-15 DIAGNOSIS — I2699 Other pulmonary embolism without acute cor pulmonale: Secondary | ICD-10-CM | POA: Diagnosis not present

## 2021-11-15 DIAGNOSIS — R918 Other nonspecific abnormal finding of lung field: Secondary | ICD-10-CM | POA: Diagnosis not present

## 2021-11-15 DIAGNOSIS — R6510 Systemic inflammatory response syndrome (SIRS) of non-infectious origin without acute organ dysfunction: Secondary | ICD-10-CM | POA: Diagnosis not present

## 2021-11-15 LAB — COMPREHENSIVE METABOLIC PANEL
ALT: 79 U/L — ABNORMAL HIGH (ref 0–44)
AST: 64 U/L — ABNORMAL HIGH (ref 15–41)
Albumin: 3 g/dL — ABNORMAL LOW (ref 3.5–5.0)
Alkaline Phosphatase: 274 U/L — ABNORMAL HIGH (ref 38–126)
Anion gap: 11 (ref 5–15)
BUN: 18 mg/dL (ref 6–20)
CO2: 19 mmol/L — ABNORMAL LOW (ref 22–32)
Calcium: 9.8 mg/dL (ref 8.9–10.3)
Chloride: 108 mmol/L (ref 98–111)
Creatinine, Ser: 0.81 mg/dL (ref 0.61–1.24)
GFR, Estimated: >60 mL/min (ref >60)
Glucose, Bld: 163 mg/dL — ABNORMAL HIGH (ref 70–99)
Potassium: 4.4 mmol/L (ref 3.5–5.1)
Sodium: 138 mmol/L (ref 135–145)
Total Bilirubin: 0.6 mg/dL (ref 0.3–1.2)
Total Protein: 7.9 g/dL (ref 6.5–8.1)

## 2021-11-15 LAB — CBC
HCT: 29.5 % — ABNORMAL LOW (ref 39.0–52.0)
Hemoglobin: 9.3 g/dL — ABNORMAL LOW (ref 13.0–17.0)
MCH: 28 pg (ref 26.0–34.0)
MCHC: 31.5 g/dL (ref 30.0–36.0)
MCV: 88.9 fL (ref 80.0–100.0)
Platelets: 391 10*3/uL (ref 150–400)
RBC: 3.32 MIL/uL — ABNORMAL LOW (ref 4.22–5.81)
RDW: 15.9 % — ABNORMAL HIGH (ref 11.5–15.5)
WBC: 5.7 10*3/uL (ref 4.0–10.5)
nRBC: 0 % (ref 0.0–0.2)

## 2021-11-15 LAB — HEPARIN LEVEL (UNFRACTIONATED)
Heparin Unfractionated: 0.27 IU/mL — ABNORMAL LOW (ref 0.30–0.70)
Heparin Unfractionated: 0.42 IU/mL (ref 0.30–0.70)
Heparin Unfractionated: 0.53 IU/mL (ref 0.30–0.70)

## 2021-11-15 NOTE — Progress Notes (Signed)
ANTICOAGULATION CONSULT NOTE   Pharmacy Consult for Xarelto to IV heparin Indication: history of PE/DVT  Allergies  Allergen Reactions   Lidocaine Other (See Comments)    Large bruise at site of patch placement    Patient Measurements: Height: 5\' 6"  (167.6 cm) Weight: 67.9 kg (149 lb 11.1 oz) IBW/kg (Calculated) : 63.8 Heparin Dosing Weight: 67.9 kg  Vital Signs: Temp: 97.9 F (36.6 C) (06/06 0153) Temp Source: Oral (06/06 0153) BP: 102/81 (06/06 0153) Pulse Rate: 97 (06/06 0153)  Labs: Recent Labs    11/13/21 0248 11/13/21 1118 11/13/21 1758 11/14/21 0434 11/15/21 0211 11/15/21 0922  HGB 8.7*  --   --  9.7* 9.3*  --   HCT 27.8*  --   --  31.1* 29.5*  --   PLT 383  --   --  413* 391  --   APTT 56* 84* 71* 47*  --   --   HEPARINUNFRC 0.86*  --   --  0.28* 0.27* 0.42  CREATININE  --   --   --  0.83 0.81  --      Estimated Creatinine Clearance: 101.7 mL/min (by C-G formula based on SCr of 0.81 mg/dL).   Medical History: Past Medical History:  Diagnosis Date   Depression    HIV infection ([facility])    Assessment: 46 yo male presenting with shortness of breath.  PMH includes submassive PE and + DVT diagnosed 10/29/21 on Xarelto 15mg  PO BID and mediastinal adenopathy.  PCCM is consulted to perform bronchoscopy 6/5.  Pharmacy has been consulted to transition Xarelto to IV heparin in preparation for bronchoscopy. Last dose of Xarelto was 6/2 at 0759.   Bronchoscopy with biopsy completed on 6/5 at 1515.  No major bleeding or complications reported.  Per Dr. , ok to resume heparin 4 hours post procedure.  11/15/2021: Heparin level now therapeutic after rate increased to 1550 units/hr early this AM CBC: Hgb low relatively stable; Pltc WNL No bleeding or infusion related concerns reported by RN    Goal of Therapy:  Heparin level 0.3-0.7 Monitor platelets by anticoagulation protocol: Yes   Plan:  Continue IV heparin infusion at 1550 units/hr  Recheck heparin level in  6h as confirmatory level Daily CBC, heparin level Monitor closely for s/sx of bleeding Wait further instructions as to when safe to resume patient's home Xarelto   Vassie Loll, PharmD, BCPS Secure Chat if ?s or find phone # on room assignments 11/15/2021 12:33 PM

## 2021-11-15 NOTE — Progress Notes (Signed)
[facility]ious [facility]   Reason for visit: follow up lung mass, HIV  Interval History: BAL results with no positive findings to date; no new complaints.    Physical Exam: Constitutional:  Vitals:   11/15/21 0736 11/15/21 1311  BP:  97/78  Pulse:  96  Resp:  20  Temp:  97.7 F (36.5 C)  SpO2: 96% 97%  He is chronically ill-appearing, nad Respiratory: normal respiratory effort GI: soft, nt  Review of Systems: Constitutional: negative for fever  Lab Results  Component Value Date   WBC 5.7 11/15/2021   HGB 9.3 (L) 11/15/2021   HCT 29.5 (L) 11/15/2021   MCV 88.9 11/15/2021   PLT 391 11/15/2021    Lab Results  Component Value Date   CREATININE 0.81 11/15/2021   BUN 18 11/15/2021   NA 138 11/15/2021   K 4.4 11/15/2021   CL 108 11/15/2021   CO2 19 (L) 11/15/2021    Lab Results  Component Value Date   ALT 79 (H) 11/15/2021   AST 64 (H) 11/15/2021   ALKPHOS 274 (H) 11/15/2021     Microbiology: Recent Results (from the past 240 hour(s))  Urine Culture     Status: Abnormal   Collection Time: 11/09/21  3:01 PM   Specimen: Urine, Clean Catch  Result Value Ref Range Status   Specimen Description   Final    URINE, CLEAN CATCH Performed at [facility], [place] [address], [place], [place] [address]    Special Requests   Final    NONE Performed at [facility], [place] [address], [place], [place] [address]    Culture (A)  Final    <10,000 COLONIES/mL INSIGNIFICANT GROWTH Performed at [facility] [address], [place], [place] [address]    Report Status 11/10/2021 FINAL  Final  SARS Coronavirus 2 by RT PCR (hospital order, performed in [facility] hospital lab) *cepheid single result test* Anterior Nasal Swab     Status: None   Collection Time: 11/09/21  3:01 PM   Specimen: Anterior Nasal Swab  Result Value Ref Range Status   SARS Coronavirus 2 by RT PCR NEGATIVE NEGATIVE Final    Comment:  (NOTE) SARS-CoV-2 target nucleic acids are NOT DETECTED.  The SARS-CoV-2 RNA is generally detectable in upper and lower respiratory specimens during the acute phase of infection. The lowest concentration of SARS-CoV-2 viral copies this assay can detect is 250 copies / mL. A negative result does not preclude SARS-CoV-2 infection and should not be used as the sole basis for treatment or other patient management decisions.  A negative result may occur with improper specimen collection / handling, submission of specimen other than nasopharyngeal swab, presence of viral mutation(s) within the areas targeted by this assay, and inadequate number of viral copies (<250 copies / mL). A negative result must be combined with clinical observations, patient history, and epidemiological information.  Fact Sheet for Patients:   https://www.patel.info/  Fact Sheet for Healthcare Providers: https://hall.com/  This test is not yet approved or  cleared by the [place] FDA and has been authorized for detection and/or diagnosis of SARS-CoV-2 by FDA under an Emergency Use Authorization (EUA).  This EUA will remain in effect (meaning this test can be used) for the duration of the COVID-19 declaration under Section 564(b)(1) of the Act, 21 U.S.C. section 360bbb-3(b)(1), unless the authorization is terminated or revoked sooner.  Performed at [facility], [place] [address]  [place], [place] [address]   Blood Culture (routine x 2)     Status: None   Collection Time: 11/09/21  3:38 PM   Specimen: BLOOD  Result Value Ref Range Status   Specimen Description   Final    BLOOD RIGHT ANTECUBITAL Performed at [facility] [address], [place], [place] [address]    Special Requests   Final    BOTTLES DRAWN AEROBIC AND ANAEROBIC Blood Culture results may not be optimal due to an inadequate volume of blood received in culture  bottles Performed at [facility] [address], [place], [place] [address]    Culture   Final    NO GROWTH 5 DAYS Performed at [facility], [place] [address], [place], [place] [address]    Report Status 11/14/2021 FINAL  Final  Blood Culture (routine x 2)     Status: None   Collection Time: 11/09/21  5:02 PM   Specimen: BLOOD  Result Value Ref Range Status   Specimen Description   Final    BLOOD SITE NOT SPECIFIED Performed at [facility] [address] [place], [place] [address]    Special Requests   Final    BOTTLES DRAWN AEROBIC AND ANAEROBIC Blood Culture results may not be optimal due to an inadequate volume of blood received in culture bottles Performed at [facility] [address], [place], [place] [address]    Culture   Final    NO GROWTH 5 DAYS Performed at [facility], [place] [address], [place],  [address]    Report Status 11/14/2021 FINAL  Final    Impression/Plan:  1. Lung mass - concern for cancerous mass and BAL results pending.  Will need an IR guided biopsy if no results found.  Discussed with the patient.    2.  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  BAL brushing sent. Continue on treatment.   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   Biopsy from [facility] from 06/24/21.    4.  HIV - on appropriate treatment though long history of poor compliance but doing better now.  Continue Biktarvy.    5.  Palliative care - I briefly discussed with him the concept of palliative care and his very guarded prognosis.  He remains hopeful but is starting to understand the severity of his condition.  I discussed with him that a diagnosis of lung cancer would significantly tip the scales and is worth pursuing the diagnosis for prognostic reasons.  I agree with palliative care involvement.

## 2021-11-15 NOTE — Progress Notes (Signed)
ANTICOAGULATION CONSULT NOTE   Pharmacy Consult for Xarelto to IV heparin Indication: history of PE/DVT  Allergies  Allergen Reactions   Lidocaine Other (See Comments)    Large bruise at site of patch placement    Patient Measurements: Height: 5\' 6"  (167.6 cm) Weight: 67.9 kg (149 lb 11.1 oz) IBW/kg (Calculated) : 63.8 Heparin Dosing Weight: 67.9 kg  Vital Signs: Temp: 97.9 F (36.6 C) (06/06 0153) Temp Source: Oral (06/06 0153) BP: 102/81 (06/06 0153) Pulse Rate: 97 (06/06 0153)  Labs: Recent Labs    11/13/21 0248 11/13/21 1118 11/13/21 1758 11/14/21 0434 11/15/21 0211  HGB 8.7*  --   --  9.7* 9.3*  HCT 27.8*  --   --  31.1* 29.5*  PLT 383  --   --  413* 391  APTT 56* 84* 71* 47*  --   HEPARINUNFRC 0.86*  --   --  0.28* 0.27*  CREATININE  --   --   --  0.83 0.81     Estimated Creatinine Clearance: 101.7 mL/min (by C-G formula based on SCr of 0.81 mg/dL).   Medical History: Past Medical History:  Diagnosis Date   Depression    HIV infection (HCC)    Assessment: 46 yo male presenting with shortness of breath.  PMH includes submassive PE and + DVT diagnosed 10/29/21 on Xarelto 15mg  PO BID and mediastinal adenopathy.  PCCM is consulted to perform bronchoscopy 6/5.  Pharmacy has been consulted to transition Xarelto to IV heparin in preparation for bronchoscopy. Last dose of Xarelto was 6/2 at 0759.   Bronchoscopy with biopsy completed on 6/5 at 1515.  No major bleeding or complications reported.  Per Dr. , ok to resume heparin 4 hours post procedure.  11/15/2021: Heparin level 0.27 units/mL, subtherapeutic on heparin 1450 units/hr CBC: Hgb low relatively stable; Pltc WNL No bleeding or infusion related concerns reported by RN    Goal of Therapy:  Heparin level 0.3-0.7 Monitor platelets by anticoagulation protocol: Yes   Plan:  Increase heparin infusion to 1550 units/hr  Check heparin level in 6h.   Daily CBC, heparin level Monitor closely for s/sx of  bleeding   Vassie Loll PharmD, BCPS 11/15/2021 3:05 AM

## 2021-11-15 NOTE — Progress Notes (Signed)
NAME:  Kurt Foley, MRN:  [id number], DOB:  [birth year], LOS: 5 ADMISSION DATE:  11/09/2021, CONSULTATION DATE:  11/11/21 REFERRING MD:  Dr Wyline Copas, CHIEF COMPLAINT:  AID with lung mass, nodules, mediastinal adenopathy. Recent PE   BRIEF  -46 year old patient with advanced AIDS.,  Substance abuse, depression disseminated MAC, prior CVA with residual right-sided weakness recent admission first on 10/06/2021 - 10/07/2021 with dry cough and fever.  Pulmonary embolism negative.  But had progressive masslike peripheral right upper lobe opacity subpleural.  Then again 10/29/2021 - 11/02/2021 with bilateral pulmonary embolism carpal mild treated with the systemic anticoagulation discharged on Xarelto.  Set up to see Dr. Leory Plowman Icard for evaluation of bronchoscopy mid June 2023 but readmitted 11/09/2021 with 2 to 3 days of painful cough palpitations decreased appetite.  Tachycardic.  Mild elevation lactic acid 2.6.  Infectious disease does not think this is a viral syndrome..  Repeat CT scan of the chest confirms the abnormal pulmonary parenchymal findings and mediastinal/hilar/subcarinal adenopathy.  History also reveals that he has missed 2 months of MAC treatment.  No dyspnea or fever.  Infectious disease requesting tissue sampling of the lung masses and also mediastinal adenopathy for pathology and culture.  Therefore pulmonary consulted.  Patient currently on Xarelto.    Previous recent ID workup 5/31 bcx negative to date 5/31 urine cx <10k insignificant growth; no urinary sx 4/27 hepatitis panel negative 07/2021 crypto ag negative; blasto ag negative CT chest 11/09/21 Bronchoscopy 6/5 with EBUS of subcarinal lymph node, brushings and BAL of right upper lobe mass   Past Medical History:   has a past medical history of Depression and HIV infection ([place]).    Significant Hospital Events:  11/09/2021 - admit   Interim History / Subjective:   Afebrile, no distress 96% on room air No  hemoptysis   Objective   Blood pressure 102/81, pulse 97, temperature 97.9 F (36.6 C), temperature source Oral, resp. rate (!) 25, height _0  (1.676 m), weight 67.9 kg, SpO2 96 %.        Intake/Output Summary (Last 24 hours) at 11/15/2021 1021 Last data filed at 11/15/2021 4098 Gross per 24 hour  Intake 1031.99 ml  Output 450 ml  Net 581.99 ml    Filed Weights   11/09/21 2040  Weight: 67.9 kg    Examination: General: emaciated male. Pleasant. No distress.  HENT: Mild pallor, no icterus Lungs: No accessory muscle use, clear to auscultation Cardiovascular: RRR Abdomen: abd soft Extremities: intact Neuro: Alert, interactive, right-sided weakness, ambulates with a walker GU: not examined  Resolved Hospital Problem list   x  Assessment & Plan:    HIV/AIDS with low CD4 and MAC    PE - end Jan 2023   Mediastinal ADenopathy  - Left hilar masss  4.2 x 2.4 cm   - Right hilar adenopathy measuring up to 1.5 cm  - Subcarinal lymph node measuring up to 1.4 cm in short axis. Lymphadenopathy appears to be regressing compared to previous imaging  Lung nodule -   - Left Upper Lobe 1cm  - Left IUpper Lobe 9pmm - RUL subpleural 3.6cm (with surrounding bullous emphysematous changes)  P:   Underwent bronchoscopy with EBUS of subcarinal lymph node and BAL and brushings of right upper lobe mass , biopsies not taken due to surrounding emphysema -Left hilar adenopathy appears to be in the AP window and not visualized on EBUS -Await results of cytology and BAL AFB culture If negative, can consider CT-guided needle biopsy  of right upper lobe mass by IR  Discussed with TRH and ID, PCCM available as needed   Best practice (daily eval):  Per triad   Goals of Care:    Family Updates 11/11/21 : his mom Glendal Cassaday [phone]. PAtient said we can tell mom about his HIV/AIDS etc.,   Kara Mead MD. FCCP. [facility] Pager : [phone]  If no  response to pager , please call [phone] until 7 pm After 7:00 pm call Elink  [phone]   11/15/2021

## 2021-11-15 NOTE — TOC Progression Note (Signed)
Transition of Care [facility]) - Progression Note    Patient Details  Name: Kurt Foley MRN: [id number] Date of Birth: 1976-03-29  Transition of Care [facility]) CM/SW Contact  Shreena Baines, Olegario Messier, RN Phone Number: 11/15/2021, 3:33 PM  Clinical Narrative: Referral to otpt PT set. Declines SA resources.      Expected Discharge Plan: OP Rehab    Expected Discharge Plan and Services Expected Discharge Plan: OP Rehab                                               Social Determinants of Health (SDOH) Interventions    Readmission Risk Interventions    10/31/2021    9:45 AM  Readmission Risk Prevention Plan  Transportation Screening Complete  Medication Review (RN Care Manager) Complete  PCP or Specialist appointment within 3-5 days of discharge Complete  HRI or Home Care Consult Complete  SW Recovery Care/Counseling Consult Complete  Palliative Care Screening Not Applicable  Skilled Nursing Facility Not Applicable

## 2021-11-15 NOTE — Progress Notes (Signed)
ANTICOAGULATION CONSULT NOTE   Pharmacy Consult for Xarelto to IV heparin Indication: history of PE/DVT  Allergies  Allergen Reactions   Lidocaine Other (See Comments)    Large bruise at site of patch placement    Patient Measurements: Height: 5\' 6"  (167.6 cm) Weight: 67.9 kg (149 lb 11.1 oz) IBW/kg (Calculated) : 63.8 Heparin Dosing Weight: 67.9 kg  Vital Signs: Temp: 97.7 F (36.5 C) (06/06 1311) Temp Source: Oral (06/06 1311) BP: 97/78 (06/06 1311) Pulse Rate: 96 (06/06 1311)  Labs: Recent Labs    11/13/21 0248 11/13/21 1118 11/13/21 1758 11/14/21 0434 11/15/21 0211 11/15/21 0922 11/15/21 1543  HGB 8.7*  --   --  9.7* 9.3*  --   --   HCT 27.8*  --   --  31.1* 29.5*  --   --   PLT 383  --   --  413* 391  --   --   APTT 56* 84* 71* 47*  --   --   --   HEPARINUNFRC 0.86*  --   --  0.28* 0.27* 0.42 0.53  CREATININE  --   --   --  0.83 0.81  --   --      Estimated Creatinine Clearance: 101.7 mL/min (by C-G formula based on SCr of 0.81 mg/dL).   Medical History: Past Medical History:  Diagnosis Date   Depression    HIV infection (HCC)    Assessment: 46 yo male presenting with shortness of breath.  PMH includes submassive PE and + DVT diagnosed 10/29/21 on Xarelto 15mg  PO BID and mediastinal adenopathy.  PCCM is consulted to perform bronchoscopy 6/5.  Pharmacy has been consulted to transition Xarelto to IV heparin in preparation for bronchoscopy. Last dose of Xarelto was 6/2 at 0759.   Bronchoscopy with biopsy completed on 6/5 at 1515.  No major bleeding or complications reported.  Per Dr. , ok to resume heparin 4 hours post procedure.  11/15/2021: Heparin level therapeutic x 2 levels on 1550 units/hr CBC: Hgb low relatively stable; Pltc WNL No bleeding or infusion related concerns reported    Goal of Therapy:  Heparin level 0.3-0.7 Monitor platelets by anticoagulation protocol: Yes   Plan:  Continue IV heparin infusion at 1550 units/hr  Daily CBC,  heparin level Monitor closely for s/sx of bleeding Wait further instructions as to when safe to resume patient's home Xarelto   Vassie Loll, PharmD, BCPS Pharmacy: [phone] 11/15/2021 4:08 PM

## 2021-11-15 NOTE — Progress Notes (Signed)
Progress Note   Patient: Kurt Foley LXB:[id number] DOB: 1976/01/16 DOA: 11/09/2021     5 DOS: the patient was seen and examined on 11/15/2021   Brief hospital course: 46 y.o. male with medical history significant of substance use, right upper lobe mass, HIV, PML, MAI, emphysema, CVA, PE, depression presenting with weakness and shortness of breath.  Patient recently admitted from 5/20 until 5/24 with shortness of breath which is multifactorial.  Noted to have DVT and PE with right heart strain but no right heart failure started discharged on Xarelto, also noted to have a right upper lobe mass due to be evaluated by bronchoscopy by pulmonology after discharge has not yet been done, also with history of MAI on ethambutol, also with HIV/AIDS with CD4 of 46 on suppressive antibiotics,   Assessment and Plan: No notes have been filed under this hospital service. Service: Hospitalist SIRS > Patient presenting with shortness of breath and tachycardia with heart rate in the 130s and respiratory rate in the 30s qualifying for SIRS criteria.   > Urinalysis is without infection and chest x-ray shows stable changes from previous was treated for possible pneumonia during recent admission and is covered with prophylactic antibiotics of Bactrim as well as being on ethambutol for MAI. >  Febrile this visit. Appreciate input by ID. Doubt pneumonia with ID recs to hold rocephin -Pt underwent bronch 6/5 for biopsy of lung mass, with concern for fungal vs MAC infection vs malignancy. Follow up on pathology/culture   AKI > Creatinine elevated to 1.34 from baseline of 1.  > Renal function normalized with hydration -recheck bmet in AM   Pulmonary embolus > Diagnosed during recent admission.  - Currently on heparin gtt, would resume xarelto if no further procedures are planned - Reviewed repeat CT chest. PE appears improved on CT   Right upper lobe mass > Noted during recent admission, somewhat suspicious  for cancer due to adenopathy as well.  -Pulm following and pt is now s/p bronch 6/5. F/u on cultures -If no results found on bronch, then would need IR guided biopsy  History of CVA > Residual right hemiparesis - Continue home rosuvastatin  Emphysema - Replace home Symbicort with formulary Dulera - Continue home Incruse - Cont PRN albuterol  HIV MAI > Last CD4 count earlier this month was 46.  Currently on prophylactic Bactrim.  Is also on ethambutol for MAI. Hold for now as pt to start tx for CAP -Pt with hx PML noted - bactrim on hold for now per ID -Cont on azithro - Continue home Biktarvy  Substance use > History of polysubstance use including marijuana, cocaine, alcohol - Continue to monitor  Depression - Continue home Seroquel and mirtazapine  GERD - Continue home PPI   Transaminitis > Chronic transaminitis  with most recent LFT's stable with AST 64, ALT 79, alk phos 274. - Cont to follow LFT trends  Goals of care -Advanced HIV with very guarded prognosis. ID recs for Palliative Care -wlll consulte Palliative Care for goals of care       Subjective: States feeling better today. Coughing has improved  Physical Exam: Vitals:   11/14/21 2101 11/15/21 0153 11/15/21 0736 11/15/21 1311  BP: 117/85 102/81  97/78  Pulse: 98 97  96  Resp: (!) 25 (!) 25  20  Temp: 97.7 F (36.5 C) 97.9 F (36.6 C)  97.7 F (36.5 C)  TempSrc: Oral Oral  Oral  SpO2: 99% 94% 96% 97%  Weight:  Height:       General exam: Awake, laying in bed, in nad Respiratory system: Normal respiratory effort, no wheezing Cardiovascular system: regular rate, s1, s2 Gastrointestinal system: Soft, nondistended, positive BS Central nervous system: CN2-12 grossly intact, strength intact Extremities: Perfused, no clubbing Skin: Normal skin turgor, no notable skin lesions seen Psychiatry: Mood normal // no visual hallucinations   Data Reviewed:  Labs reviewed: K 4.4, Cr 0.81, Hgb 9.3, AST  64, ALT 79  Family Communication: Pt in room, family not at bedside  Disposition: Status is: Inpatient Cont inpatient stay because: Severity of illness  Planned Discharge Destination: Home    Author: Marylu Lund, MD 11/15/2021 3:01 PM  For on call review [email or web address].

## 2021-11-15 NOTE — Progress Notes (Signed)
Physical Therapy Treatment Patient Details Name: Kurt Foley MRN: [id number] DOB: 1976/03/23 Today's Date: 11/15/2021   History of Present Illness 46 yo male admitted with SIRS. [name] hx of PE/DVT. 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    PT Comments    Pt ambulated 230' with hemiwalker without loss of balance, HR 112 with walking, no dyspnea noted. He is mobilizing well. He is pleasant and motivated.   Recommendations for follow up therapy are one component of a multi-disciplinary discharge planning process, led by the attending physician.  Recommendations may be updated based on patient status, additional functional criteria and insurance authorization.  Follow Up Recommendations  Outpatient PT     Assistance Recommended at Discharge PRN  Patient can return home with the following A little help with walking and/or transfers;Help with stairs or ramp for entrance   Equipment Recommendations  None recommended by PT    Recommendations for Other Services       Precautions / Restrictions Precautions Precautions: Fall Precaution Comments: R residual weakness UE/LE; R foot AFO Restrictions Weight Bearing Restrictions: No     Mobility  Bed Mobility Overal bed mobility: Modified Independent Bed Mobility: Supine to Sit     Supine to sit: Modified independent (Device/Increase time)     General bed mobility comments: used rail, HOB up    Transfers Overall transfer level: Needs assistance Equipment used: Hemi-walker Transfers: Sit to/from Stand Sit to Stand: Supervision           General transfer comment: Supv for safety, lines    Ambulation/Gait Ambulation/Gait assistance: Supervision Gait Distance (Feet): 230 Feet Assistive device: Hemi-walker Gait Pattern/deviations: Step-through pattern, Decreased stride length Gait velocity: decr     General Gait Details: Supv for safety.No LOB with  hemiwalker use. Good clearance of R foot with AFO use. Slow but steady gait   Stairs             Wheelchair Mobility    Modified Rankin (Stroke Patients Only)       Balance Overall balance assessment: Mild deficits observed, not formally tested                                          Cognition Arousal/Alertness: Awake/alert Behavior During Therapy: WFL for tasks assessed/performed Overall Cognitive Status: Within Functional Limits for tasks assessed                                          Exercises      General Comments General comments (skin integrity, edema, etc.): pt denies falls in past 6 months      Pertinent Vitals/Pain Pain Assessment Pain Assessment: No/denies pain    Home Living                          Prior Function            PT Goals (current goals can now be found in the care plan section) Acute Rehab PT Goals Patient Stated Goal: to be able to mobilize more, return to outpatient PT PT Goal Formulation: With patient Time For Goal Achievement: 11/26/21 Potential to Achieve Goals: Good Progress towards PT goals: Progressing toward goals  Frequency    Min 3X/week      PT Plan Current plan remains appropriate    Co-evaluation              AM-PAC PT "6 Clicks" Mobility   Outcome Measure  Help needed turning from your back to your side while in a flat bed without using bedrails?: None Help needed moving from lying on your back to sitting on the side of a flat bed without using bedrails?: A Little Help needed moving to and from a bed to a chair (including a wheelchair)?: A Little Help needed standing up from a chair using your arms (e.g., wheelchair or bedside chair)?: A Little Help needed to walk in hospital room?: A Little Help needed climbing 3-5 steps with a railing? : A Little 6 Click Score: 19    End of Session Equipment Utilized During Treatment: Gait belt Activity  Tolerance: Patient tolerated treatment well Patient left: in chair;with call bell/phone within reach;with family/visitor present Nurse Communication: Mobility status PT Visit Diagnosis: Difficulty in walking, not elsewhere classified (R26.2);Other symptoms and signs involving the nervous system (R29.898)     Time: 2355-7322 PT Time Calculation (min) (ACUTE ONLY): 19 min  Charges:  $Gait Training: 8-22 mins                     Ralene Bathe Kistler PT 11/15/2021  Acute Rehabilitation Services Pager [phone] Office [phone]

## 2021-11-16 DIAGNOSIS — I69351 Hemiplegia and hemiparesis following cerebral infarction affecting right dominant side: Secondary | ICD-10-CM

## 2021-11-16 DIAGNOSIS — R6510 Systemic inflammatory response syndrome (SIRS) of non-infectious origin without acute organ dysfunction: Secondary | ICD-10-CM | POA: Diagnosis not present

## 2021-11-16 DIAGNOSIS — R918 Other nonspecific abnormal finding of lung field: Secondary | ICD-10-CM | POA: Diagnosis not present

## 2021-11-16 DIAGNOSIS — J439 Emphysema, unspecified: Secondary | ICD-10-CM | POA: Diagnosis not present

## 2021-11-16 DIAGNOSIS — J189 Pneumonia, unspecified organism: Secondary | ICD-10-CM

## 2021-11-16 DIAGNOSIS — B20 Human immunodeficiency virus [HIV] disease: Secondary | ICD-10-CM | POA: Diagnosis not present

## 2021-11-16 DIAGNOSIS — G4700 Insomnia, unspecified: Secondary | ICD-10-CM

## 2021-11-16 DIAGNOSIS — R945 Abnormal results of liver function studies: Secondary | ICD-10-CM

## 2021-11-16 DIAGNOSIS — A310 Pulmonary mycobacterial infection: Secondary | ICD-10-CM | POA: Diagnosis not present

## 2021-11-16 LAB — CBC
HCT: 28.2 % — ABNORMAL LOW (ref 39.0–52.0)
Hemoglobin: 8.6 g/dL — ABNORMAL LOW (ref 13.0–17.0)
MCH: 27.8 pg (ref 26.0–34.0)
MCHC: 30.5 g/dL (ref 30.0–36.0)
MCV: 91.3 fL (ref 80.0–100.0)
Platelets: 389 10*3/uL (ref 150–400)
RBC: 3.09 MIL/uL — ABNORMAL LOW (ref 4.22–5.81)
RDW: 16.1 % — ABNORMAL HIGH (ref 11.5–15.5)
WBC: 3.7 10*3/uL — ABNORMAL LOW (ref 4.0–10.5)
nRBC: 0 % (ref 0.0–0.2)

## 2021-11-16 LAB — GLUCOSE, CAPILLARY: Glucose-Capillary: 87 mg/dL (ref 70–99)

## 2021-11-16 LAB — PROCALCITONIN: Procalcitonin: 1.05 ng/mL

## 2021-11-16 LAB — ACID FAST SMEAR (AFB, MYCOBACTERIA)

## 2021-11-16 LAB — HEPARIN LEVEL (UNFRACTIONATED)
Heparin Unfractionated: 0.23 IU/mL — ABNORMAL LOW (ref 0.30–0.70)
Heparin Unfractionated: 0.29 IU/mL — ABNORMAL LOW (ref 0.30–0.70)
Heparin Unfractionated: 0.54 IU/mL (ref 0.30–0.70)

## 2021-11-16 LAB — CYTOLOGY - NON PAP

## 2021-11-16 MED ORDER — METOPROLOL TARTRATE 5 MG/5ML IV SOLN
5.0000 mg | INTRAVENOUS | Status: DC | PRN
  Administered 2021-11-18 – 2021-11-23 (×7): 5 mg via INTRAVENOUS
  Filled 2021-11-16 (×10): qty 5

## 2021-11-16 MED ORDER — HEPARIN (PORCINE) 25000 UT/250ML-% IV SOLN
1900.0000 [IU]/h | INTRAVENOUS | Status: DC
  Administered 2021-11-16 – 2021-11-17 (×2): 1900 [IU]/h via INTRAVENOUS
  Filled 2021-11-16 (×4): qty 250

## 2021-11-16 MED ORDER — VANCOMYCIN HCL 1500 MG/300ML IV SOLN
1500.0000 mg | INTRAVENOUS | Status: AC
  Administered 2021-11-16: 1500 mg via INTRAVENOUS
  Filled 2021-11-16: qty 300

## 2021-11-16 MED ORDER — HYDRALAZINE HCL 20 MG/ML IJ SOLN: 10.0000 mg | INTRAMUSCULAR | Status: DC | PRN

## 2021-11-16 MED ORDER — HEPARIN BOLUS VIA INFUSION
1500.0000 [IU] | Freq: Once | INTRAVENOUS | Status: AC
Start: 2021-11-16 — End: 2021-11-16
  Administered 2021-11-16: 1500 [IU] via INTRAVENOUS
  Filled 2021-11-16: qty 1500

## 2021-11-16 MED ORDER — SENNOSIDES-DOCUSATE SODIUM 8.6-50 MG PO TABS: 1.0000 | ORAL_TABLET | Freq: Every evening | ORAL | Status: DC | PRN

## 2021-11-16 MED ORDER — SODIUM CHLORIDE 0.9 % IV SOLN
2.0000 g | Freq: Three times a day (TID) | INTRAVENOUS | Status: DC
  Administered 2021-11-16 – 2021-11-17 (×2): 2 g via INTRAVENOUS
  Filled 2021-11-16 (×4): qty 12.5

## 2021-11-16 MED ORDER — IPRATROPIUM-ALBUTEROL 0.5-2.5 (3) MG/3ML IN SOLN
3.0000 mL | RESPIRATORY_TRACT | Status: DC | PRN
  Administered 2021-11-18 – 2021-11-19 (×2): 3 mL via RESPIRATORY_TRACT
  Filled 2021-11-16: qty 3
  Filled 2021-11-16: qty 30

## 2021-11-16 MED ORDER — VANCOMYCIN HCL IN DEXTROSE 1-5 GM/200ML-% IV SOLN
1000.0000 mg | Freq: Two times a day (BID) | INTRAVENOUS | Status: DC
Start: 2021-11-17 — End: 2021-11-17
  Administered 2021-11-17: 1000 mg via INTRAVENOUS
  Filled 2021-11-16: qty 200

## 2021-11-16 MED ORDER — IBUPROFEN 200 MG PO TABS
400.0000 mg | ORAL_TABLET | Freq: Once | ORAL | Status: AC
  Administered 2021-11-16: 400 mg via ORAL
  Filled 2021-11-16: qty 2

## 2021-11-16 MED ORDER — ENSURE ENLIVE PO LIQD
237.0000 mL | Freq: Three times a day (TID) | ORAL | Status: DC
  Administered 2021-11-16 – 2021-11-30 (×29): 237 mL via ORAL

## 2021-11-16 NOTE — Progress Notes (Signed)
PROGRESS NOTE    Kurt Foley  JJK:[id number] DOB: 1976-01-23 DOA: 11/09/2021 PCP: Gildardo Pounds, NP   Brief Narrative:  46 year old with advanced AIDS, disseminated MAC substance abuse, depression, prior CVA with residual right-sided weakness, pulmonary embolism on Xarelto admitted for cough and palpitations.  Repeat CT showed abnormal lung parenchyma.  He was seen by pulmonary, cultures remain negative.  Underwent bronchoscopy with EBUS on 6/5 with subcarinal lymph node sampling and brushing with BAL of right upper lobe mass.   Assessment & Plan:  Principal Problem:   SIRS (systemic inflammatory response syndrome) (HCC) Active Problems:   Mass of upper lobe of right lung   Human immunodeficiency virus (HIV) disease (HCC)   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HCC)   Emphysema, unspecified (HCC)   Abnormal LFTs   History of CVA with residual right hemiparesis   Pulmonary embolism (HCC)   Severe recurrent major depression without psychotic features ([place])   Insomnia   Pneumonia   Mediastinal lymphadenopathy     Assessment and Plan: SIRS -Improving.  No obvious source of infection.  Infectious diseases following.  Holding off on antibiotic.  Follow-up culture data from BAL brushing.   AKI -Resolved   Pulmonary embolus -Xarelto is currently on hold.  On heparin drip   Right upper lobe mass -Status post bronchoscopy with EBUS on 6/5.  Follow-up culture data.  History of CVA -Residual right-sided weakness.  On statin  Emphysema -Bronchodilators  HIV with history of MAI. -Infectious diseases following.  currently on Biktarvy.  On azithromycin daily as well.  Substance use -Counseled to quit using.  Depression -On Seroquel and Remeron  GERD -Daily PPI   Transaminitis -Trend LFTs   Goals of care -Advanced HIV with very guarded prognosis. ID recs for Palliative Care -wlll consulte Palliative Care for goals of care      DVT  prophylaxis:   Heparin drip Code Status: Full code Family Communication:    Status is: Inpatient Remains inpatient appropriate because: Continue to follow BAL culture.  ID and pulmonary following.  May require IR for biopsy   Nutritional status    Signs/Symptoms: estimated needs  Interventions: Refer to RD note for recommendations, Ensure Enlive (each supplement provides 350kcal and 20 grams of protein)  Body mass index is 24.16 kg/m.         Subjective: Seen and examined at bedside, no complaints today.  Tolerating orals without any issues.   Examination:  Constitutional: Not in acute distress, cachectic frail Respiratory: Clear to auscultation bilaterally Cardiovascular: Normal sinus rhythm, no rubs Abdomen: Nontender nondistended good bowel sounds Musculoskeletal: No edema noted Skin: No rashes seen Neurologic: CN 2-12 grossly intact.  And nonfocal Psychiatric: Normal judgment and insight. Alert and oriented x 3. Normal mood.  Objective: Vitals:   11/15/21 1311 11/15/21 2226 11/16/21 0454 11/16/21 0749  BP: 97/78 108/88 113/81   Pulse: 96 98 (!) 107   Resp: 20 (!) 23 20   Temp: 97.7 F (36.5 C) 98.5 F (36.9 C) 97.9 F (36.6 C)   TempSrc: Oral Oral Oral   SpO2: 97% 96% 100% 95%  Weight:      Height:        Intake/Output Summary (Last 24 hours) at 11/16/2021 0908 Last data filed at 11/16/2021 0600 Gross per 24 hour  Intake 1064.33 ml  Output 1150 ml  Net -85.67 ml   Filed Weights   11/09/21 2040  Weight: 67.9 kg     Data Reviewed:   CBC: Recent Labs  Lab 11/09/21 1541 11/09/21 1600 11/12/21 0112 11/13/21 0248 11/14/21 0434 11/15/21 0211 11/16/21 0429  WBC 6.6   < > 6.6 4.9 5.1 5.7 3.7*  NEUTROABS 4.7  --   --   --   --   --   --   HGB 11.8*   < > 9.3* 8.7* 9.7* 9.3* 8.6*  HCT 37.2*   < > 29.5* 27.8* 31.1* 29.5* 28.2*  MCV 89.0   < > 88.3 89.4 89.6 88.9 91.3  PLT 411*   < > 370 383 413* 391 389   < > = values in this interval not  displayed.   Basic Metabolic Panel: Recent Labs  Lab 11/09/21 1541 11/09/21 1600 11/10/21 0502 11/11/21 0611 11/12/21 0112 11/14/21 0434 11/15/21 0211  NA 137 131* 136 139  --  138 138  K 4.9 6.3* 4.2 4.2  --  4.4 4.4  CL 105 107 108 109  --  109 108  CO2 19*  --  17* 21*  --  18* 19*  GLUCOSE 99 91 104* 112*  --  98 163*  BUN 20 25* 13 13  --  13 18  CREATININE 1.34* 1.20 1.05 1.10  --  0.83 0.81  CALCIUM 11.2*  --  10.4* 9.7  --  9.7 9.8  MG  --   --   --   --  2.0  --   --   PHOS  --   --   --   --  4.7*  --   --    GFR: Estimated Creatinine Clearance: 101.7 mL/min (by C-G formula based on SCr of 0.81 mg/dL). Liver Function Tests: Recent Labs  Lab 11/09/21 1541 11/10/21 0502 11/11/21 1017 11/14/21 0434 11/15/21 0211  AST 77* 67* 59* 60* 64*  ALT 115* 90* 77* 72* 79*  ALKPHOS 344* 285* 280* 270* 274*  BILITOT 0.5 0.8 0.4 0.3 0.6  PROT 9.5* 7.8 7.2 8.1 7.9  ALBUMIN 3.6 3.1* 2.8* 3.1* 3.0*   No results for input(s): LIPASE, AMYLASE in the last 168 hours. No results for input(s): AMMONIA in the last 168 hours. Coagulation Profile: Recent Labs  Lab 11/09/21 1541  INR 2.3*   Cardiac Enzymes: No results for input(s): CKTOTAL, CKMB, CKMBINDEX, TROPONINI in the last 168 hours. BNP (last 3 results) No results for input(s): PROBNP in the last 8760 hours. HbA1C: No results for input(s): HGBA1C in the last 72 hours. CBG: Recent Labs  Lab 11/09/21 1452  GLUCAP 99   Lipid Profile: No results for input(s): CHOL, HDL, LDLCALC, TRIG, CHOLHDL, LDLDIRECT in the last 72 hours. Thyroid Function Tests: No results for input(s): TSH, T4TOTAL, FREET4, T3FREE, THYROIDAB in the last 72 hours. Anemia Panel: No results for input(s): VITAMINB12, FOLATE, FERRITIN, TIBC, IRON, RETICCTPCT in the last 72 hours. Sepsis Labs: Recent Labs  Lab 11/09/21 1541 11/09/21 1831 11/10/21 0502 11/11/21 0611  PROCALCITON 0.56  --  0.46 0.36  LATICACIDVEN 2.6* 1.6  --   --     Recent  Results (from the past 240 hour(s))  Urine Culture     Status: Abnormal   Collection Time: 11/09/21  3:01 PM   Specimen: Urine, Clean Catch  Result Value Ref Range Status   Specimen Description   Final    URINE, CLEAN CATCH Performed at [facility], [place] [address] [place], [place] [address]    Special Requests   Final    NONE Performed at [facility], [place] [address]  [place], [place] [address]    Culture (A)  Final    <10,000 COLONIES/mL INSIGNIFICANT GROWTH Performed at [facility] [address], [place], [place] [address]    Report Status 11/10/2021 FINAL  Final  SARS Coronavirus 2 by RT PCR (hospital order, performed in [facility] hospital lab) *cepheid single result test* Anterior Nasal Swab     Status: None   Collection Time: 11/09/21  3:01 PM   Specimen: Anterior Nasal Swab  Result Value Ref Range Status   SARS Coronavirus 2 by RT PCR NEGATIVE NEGATIVE Final    Comment: (NOTE) SARS-CoV-2 target nucleic acids are NOT DETECTED.  The SARS-CoV-2 RNA is generally detectable in upper and lower respiratory specimens during the acute phase of infection. The lowest concentration of SARS-CoV-2 viral copies this assay can detect is 250 copies / mL. A negative result does not preclude SARS-CoV-2 infection and should not be used as the sole basis for treatment or other patient management decisions.  A negative result may occur with improper specimen collection / handling, submission of specimen other than nasopharyngeal swab, presence of viral mutation(s) within the areas targeted by this assay, and inadequate number of viral copies (<250 copies / mL). A negative result must be combined with clinical observations, patient history, and epidemiological information.  Fact Sheet for Patients:   https://www.patel.info/  Fact Sheet for Healthcare Providers: https://hall.com/  This test is not  yet approved or  cleared by the [place] FDA and has been authorized for detection and/or diagnosis of SARS-CoV-2 by FDA under an Emergency Use Authorization (EUA).  This EUA will remain in effect (meaning this test can be used) for the duration of the COVID-19 declaration under Section 564(b)(1) of the Act, 21 U.S.C. section 360bbb-3(b)(1), unless the authorization is terminated or revoked sooner.  Performed at [facility], [place] [address], [place], [place] [address]   Blood Culture (routine x 2)     Status: None   Collection Time: 11/09/21  3:38 PM   Specimen: BLOOD  Result Value Ref Range Status   Specimen Description   Final    BLOOD RIGHT ANTECUBITAL Performed at [facility] [address] [place], [place] [address]    Special Requests   Final    BOTTLES DRAWN AEROBIC AND ANAEROBIC Blood Culture results may not be optimal due to an inadequate volume of blood received in culture bottles Performed at [facility] [address] [place], [place] [address]    Culture   Final    NO GROWTH 5 DAYS Performed at [facility], [place] [address], [place], [place] [address]    Report Status 11/14/2021 FINAL  Final  Blood Culture (routine x 2)     Status: None   Collection Time: 11/09/21  5:02 PM   Specimen: BLOOD  Result Value Ref Range Status   Specimen Description   Final    BLOOD SITE NOT SPECIFIED Performed at [facility] [address], [place], [place] [address]    Special Requests   Final    BOTTLES DRAWN AEROBIC AND ANAEROBIC Blood Culture results may not be optimal due to an inadequate volume of blood received in culture bottles Performed at [facility] [address] [place], [place] [address]    Culture   Final    NO GROWTH 5 DAYS Performed at [facility], [place] [address], [place], [place] [address]    Report Status  11/14/2021 FINAL  Final  Culture,  Respiratory w Gram Stain     Status: None (Preliminary result)   Collection Time: 11/14/21  2:42 PM   Specimen: Bronchial Washing, Right; Respiratory  Result Value Ref Range Status   Specimen Description   Final    BRONCHIAL ALVEOLAR LAVAGE RUL Performed at [facility] [address] [place], [place] [address]    Special Requests   Final    NONE Performed at [facility], [place] [address] [place], [place] [address]    Gram Stain   Final    NO WBC SEEN NO ORGANISMS SEEN Performed at [facility], [place] [address] [place], [place] [address]    Culture PENDING  Incomplete   Report Status PENDING  Incomplete         Radiology Studies: DG CHEST PORT 1 VIEW  Result Date: 11/14/2021 CLINICAL DATA:  [id number]. Constant cough status post bronchoscopy with bronchoalveolar lavage EXAM: PORTABLE CHEST 1 VIEW COMPARISON:  CT chest angiography 10/29/2021, CT angio chest 10/06/2021 FINDINGS: The heart and mediastinal contours are within normal limits. Left base atelectasis. Persistent right upper lobe airspace opacity. Chronic coarsened Essure markings within the upper lobes. No pulmonary edema. No pleural effusion. No pneumothorax. No acute osseous abnormality. IMPRESSION: 1. No acute cardiopulmonary abnormality. 2. Persistent right upper lobe mass. Finding is better evaluated on CT angiography chest 10/29/2021. 3.  Emphysema (ICD10-J43.9). Electronically Signed   By: Iven Finn M.D.   On: 11/14/2021 16:10   DG C-Arm 1-60 Min-No Report  Result Date: 11/14/2021 Fluoroscopy was utilized by the requesting physician.  No radiographic interpretation.        Scheduled Meds:  azithromycin  500 mg Oral Daily   B-complex with vitamin C  1 tablet Oral Daily   bictegravir-emtricitabine-tenofovir AF  1 tablet Oral Daily   ethambutol  800 mg Oral Daily   feeding supplement  237 mL Oral BID BM   ketoconazole   Topical BID   metoprolol succinate  50 mg  Oral q morning   mirtazapine  15 mg Oral QHS   mometasone-formoterol  2 puff Inhalation BID   pantoprazole  40 mg Oral QAC breakfast   QUEtiapine  200 mg Oral QHS   rosuvastatin  5 mg Oral q morning   sodium chloride flush  3 mL Intravenous Q12H   umeclidinium bromide  1 puff Inhalation Daily   Continuous Infusions:  heparin 1,700 Units/hr (11/16/21 0616)   lactated ringers 10 mL/hr at 11/14/21 1414     LOS: 6 days   Time spent= 35 mins    Wang [place] Arsenio Loader, MD [facility]  If 7PM-7AM, please contact night-coverage  11/16/2021, 9:08 AM

## 2021-11-16 NOTE — Progress Notes (Signed)
Nutrition Follow-up  DOCUMENTATION CODES:   Not applicable  INTERVENTION:  - continue Ensure and will increase from BID to TID. - weigh patient today.    NUTRITION DIAGNOSIS:   Increased nutrient needs related to acute illness, chronic illness as evidenced by estimated needs. -ongoing  GOAL:   Patient will meet greater than or equal to 90% of their needs -variably met  MONITOR:   PO intake, Supplement acceptance, Labs, Weight trends  ASSESSMENT:   46 y.o. male with medical history of substance use, RUL mass pending bronch by Pulmonology to evaluate, HIV/AIDS, PML, MAI, emphysema, CVA with residual R-sided hemiparesis, and depression. He was admitted 5/20-5/24 due to shortness of breath and at that time was noted at that time to have DVT and PE with R-sided heart strain. He returned to the ED due to ongoing shortness of breath, weakness,  cough which is sometimes painful, sensation of his heart racing, and decreased urine output with concern for dehydration. Patient was admitted for SIRS.  Patient underwent bronch with biopsy d/t lung mass on 6/5.  From 6/2-6/4 he was eating 50-100% at meals. Yesterday the only documented intake was 0% of lunch.  Able to talk with MD following visit to patient's room. RN at bedside at the time of visit. Patient sitting up in bed with no visitors present at the time of RD visit.   Patient shares that he has not had much of an appetite since biopsy. Patient was ordered [facility] outpatient and at the time of RD assessment on 6/1; this order has since been discontinued.   Patient denies oral or esophageal pain. He shares that wraps have been something he tolerates well. He did not eat breakfast this AM due to poor appetite but states that he may order lunch around 1300.   He has been drinking Ensure 100% of the time provided and was sipping on one this AM. He shares that this has been helpful for meal times when he does not feel up to eating.    Palliative Care consult for [place] pending.   He has not been weighed since admission on 5/31. No information documented in the edema section of flow sheet this admission.   Labs reviewed; Alk Phos and LFTs elevated and slightly up from 6/6. Medications reviewed; 1 tablet vitamin B complex with vitamin C/day, 40 mg oral protonix/day.   Diet Order:   Diet Order             Diet regular Room service appropriate? Yes; Fluid consistency: Thin  Diet effective now                   EDUCATION NEEDS:   No education needs have been identified at this time  Skin:  Skin Assessment: Reviewed RN Assessment  Last BM:  6/4 (type 4 x1, medium amount)  Height:   Ht Readings from Last 1 Encounters:  11/09/21 $RemoveB'5\' 6"'uUJmDacy$  (1.676 m)    Weight:   Wt Readings from Last 1 Encounters:  11/09/21 67.9 kg     BMI:  Body mass index is 24.16 kg/m.  Estimated Nutritional Needs:  Kcal:  2050-2250 kcal Protein:  102-115 grams Fluid:  >/= 2.2 L/day     Jarome Matin, MS, RD, LDN Registered Dietitian II Inpatient Clinical Nutrition RD pager # and on-call/weekend pager # available in [facility]

## 2021-11-16 NOTE — Progress Notes (Signed)
ANTICOAGULATION CONSULT NOTE   Pharmacy Consult for Xarelto to IV heparin Indication: history of PE/DVT  Allergies  Allergen Reactions   Lidocaine Other (See Comments)    Large bruise at site of patch placement    Patient Measurements: Height: 5\' 6"  (167.6 cm) Weight: 67.9 kg (149 lb 11.1 oz) IBW/kg (Calculated) : 63.8 Heparin Dosing Weight: 67.9 kg  Vital Signs: Temp: 99.8 F (37.7 C) (06/07 1306) Temp Source: Oral (06/07 1306) BP: 120/87 (06/07 1306) Pulse Rate: 113 (06/07 1306)  Labs: Recent Labs    11/13/21 1758 11/14/21 0434 11/14/21 0434 11/15/21 0211 11/15/21 0922 11/15/21 1543 11/16/21 0429 11/16/21 1304  HGB  --  9.7*   < > 9.3*  --   --  8.6*  --   HCT  --  31.1*  --  29.5*  --   --  28.2*  --   PLT  --  413*  --  391  --   --  389  --   APTT 71* 47*  --   --   --   --   --   --   HEPARINUNFRC  --  0.28*   < > 0.27*   < > 0.53 0.23* 0.29*  CREATININE  --  0.83  --  0.81  --   --   --   --    < > = values in this interval not displayed.     Estimated Creatinine Clearance: 101.7 mL/min (by C-G formula based on SCr of 0.81 mg/dL).   Medical History: Past Medical History:  Diagnosis Date   Depression    HIV infection (HCC)    Assessment: 46 yo male presenting with shortness of breath.  PMH includes submassive PE and + DVT diagnosed 10/29/21 on Xarelto 15mg  PO BID and mediastinal adenopathy.  PCCM is consulted to perform bronchoscopy 6/5.  Pharmacy has been consulted to transition Xarelto to IV heparin in preparation for bronchoscopy. Last dose of Xarelto was 6/2 at 0759.   Bronchoscopy with biopsy completed on 6/5 at 1515.  No major bleeding or complications reported.  Per Dr. , ok to resume heparin 4 hours post procedure.  11/16/2021: Heparin level now just below goal after rate of heparin increased to 1700 units/hr early this morning CBC: Hgb low relatively stable; Pltc WNL No bleeding or infusion related concerns reported by RN   Goal of  Therapy:  Heparin level 0.3-0.7 Monitor platelets by anticoagulation protocol: Yes   Plan:  Increase IV heparin from 1700 units/hr to 1900 units/hr Recheck heparin level 6h after rate change Daily CBC, heparin level Monitor closely for s/sx of bleeding If patient to have biopsy tomorrow 6/8, wait for instructions as to when to hold the IV heparin prior to procedure Wait further instructions as to when safe to resume patient's home Xarelto   02/08/2022, PharmD, BCPS Secure Chat if ?s 11/16/2021 2:54 PM

## 2021-11-16 NOTE — Progress Notes (Signed)
   11/16/21 1748  Vitals  Temp (!) 103.2 F (39.6 C)  Temp Source Oral  BP 107/84  MAP (mmHg) 93  BP Method Automatic  Pulse Rate (!) 130  Pulse Rate Source Monitor  Resp (!) 25  MEWS COLOR  MEWS Score Color Red  Oxygen Therapy  SpO2 94 %  O2 Device Room Air  MEWS Score  MEWS Temp 2  MEWS Systolic 0  MEWS Pulse 3  MEWS RR 1  MEWS LOC 0  MEWS Score 6   MD Amin successfully notified & charge RN Lovette Cliche. Notified. Blood cultures drawn & IV abx started immediately after bcx.

## 2021-11-16 NOTE — Progress Notes (Signed)
Palliative:  Consult received and chart reviewed. Attempted x2 to visit with patient: at first attempt he tells me he is too tired and to come back at 2:30. I went back at 2:30 and patient was sleeping; I gently woke up but he again tells me he is too tired to speak with me and would prefer [place] to come back tomorrow.  I will not be on service after today but will request a teammate follow up.  Gerlean Ren, DNP, AGNP-C Palliative Medicine Team Team Phone # [phone]  Pager # [phone]  NO CHARGE

## 2021-11-16 NOTE — Progress Notes (Signed)
[place] for Xarelto to IV heparin Indication: history of PE/DVT  Allergies  Allergen Reactions   Lidocaine Other (See Comments)    Large bruise at site of patch placement    Patient Measurements: Height: 5\' 6"  (167.6 cm) Weight: 67.9 kg (149 lb 11.1 oz) IBW/kg (Calculated) : 63.8 Heparin Dosing Weight: 67.9 kg  Vital Signs: Temp: 98.1 F (36.7 C) (06/07 2222) Temp Source: Oral (06/07 2222) BP: 91/64 (06/07 2222) Pulse Rate: 78 (06/07 2222)  Labs: Recent Labs    11/14/21 0434 11/15/21 0211 11/15/21 0922 11/16/21 0429 11/16/21 1304 11/16/21 2145  HGB 9.7* 9.3*  --  8.6*  --   --   HCT 31.1* 29.5*  --  28.2*  --   --   PLT 413* 391  --  389  --   --   APTT 47*  --   --   --   --   --   HEPARINUNFRC 0.28* 0.27*   < > 0.23* 0.29* 0.54  CREATININE 0.83 0.81  --   --   --   --    < > = values in this interval not displayed.     Estimated Creatinine Clearance: 101.7 mL/min (by C-G formula based on SCr of 0.81 mg/dL).   Medical History: Past Medical History:  Diagnosis Date   Depression    HIV infection ([place])    Assessment: 46 yo male presenting with shortness of breath.  PMH includes submassive PE and + DVT diagnosed 10/29/21 on Xarelto 15mg  PO BID and mediastinal adenopathy.  PCCM is consulted to perform bronchoscopy 6/5.  Pharmacy has been consulted to transition Xarelto to IV heparin in preparation for bronchoscopy. Last dose of Xarelto was 6/2 at 0759.   Bronchoscopy with biopsy completed on 6/5 at 1515.  No major bleeding or complications reported.  Per Dr. Elsworth Soho, ok to resume heparin 4 hours post procedure.  11/16/2021: Heparin level 0.54-now therapeutic on IV heparin @ 1900 units/hr  CBC: Hgb low relatively stable; Pltc WNL No bleeding or infusion related concerns reported by RN   Goal of Therapy:  Heparin level 0.3-0.7 Monitor platelets by anticoagulation protocol: Yes   Plan:  Continue IV heparin @ 1900  units/hr Recheck heparin level @ 0500 with morning labs Daily CBC, heparin level Monitor closely for s/sx of bleeding If patient to have biopsy tomorrow 6/8, wait for instructions as to when to hold the IV heparin prior to procedure Wait further instructions as to when safe to resume patient's home [place], PharmD, BCPS 11/16/2021 10:38 PM

## 2021-11-16 NOTE — Progress Notes (Signed)
ANTICOAGULATION CONSULT NOTE   Pharmacy Consult for Xarelto to IV heparin Indication: history of PE/DVT  Allergies  Allergen Reactions   Lidocaine Other (See Comments)    Large bruise at site of patch placement    Patient Measurements: Height: 5\' 6"  (167.6 cm) Weight: 67.9 kg (149 lb 11.1 oz) IBW/kg (Calculated) : 63.8 Heparin Dosing Weight: 67.9 kg  Vital Signs: Temp: 97.9 F (36.6 C) (06/07 0454) Temp Source: Oral (06/07 0454) BP: 113/81 (06/07 0454) Pulse Rate: 107 (06/07 0454)  Labs: Recent Labs    11/13/21 1118 11/13/21 1758 11/14/21 0434 11/14/21 0434 11/15/21 0211 11/15/21 0922 11/15/21 1543 11/16/21 0429  HGB  --   --  9.7*   < > 9.3*  --   --  8.6*  HCT  --   --  31.1*  --  29.5*  --   --  28.2*  PLT  --   --  413*  --  391  --   --  389  APTT 84* 71* 47*  --   --   --   --   --   HEPARINUNFRC  --   --  0.28*   < > 0.27* 0.42 0.53 0.23*  CREATININE  --   --  0.83  --  0.81  --   --   --    < > = values in this interval not displayed.     Estimated Creatinine Clearance: 101.7 mL/min (by C-G formula based on SCr of 0.81 mg/dL).   Medical History: Past Medical History:  Diagnosis Date   Depression    HIV infection (HCC)    Assessment: 46 yo male presenting with shortness of breath.  PMH includes submassive PE and + DVT diagnosed 10/29/21 on Xarelto 15mg  PO BID and mediastinal adenopathy.  PCCM is consulted to perform bronchoscopy 6/5.  Pharmacy has been consulted to transition Xarelto to IV heparin in preparation for bronchoscopy. Last dose of Xarelto was 6/2 at 0759.   Bronchoscopy with biopsy completed on 6/5 at 1515.  No major bleeding or complications reported.  Per Dr. , ok to resume heparin 4 hours post procedure.  11/16/2021: Heparin level 0.23- now subtherapeutic on 1550 units/hr CBC: Hgb low relatively stable; Pltc WNL No bleeding or infusion related concerns reported by RN   Goal of Therapy:  Heparin level 0.3-0.7 Monitor platelets by  anticoagulation protocol: Yes   Plan:  Heparin 1500 units IV bolus x1 then increase heparin infusion to 1700 units/hr  Recheck heparin level 6h after rate change Daily CBC, heparin level Monitor closely for s/sx of bleeding Wait further instructions as to when safe to resume patient's home Xarelto  Vassie Loll, PharmD, BCPS 11/16/2021 5:54 AM

## 2021-11-16 NOTE — Progress Notes (Signed)
Pharmacy Antibiotic Note  Kurt Foley is a 46 y.o. male admitted on 11/09/2021 with shortness of breath. Underwent bronchoscopy 6/5 with EBUS of subcarinal lymph node, brushings and BAL of right upper lobe mass. Patient with fever of 103.2 this evening. Pharmacy has been consulted for Vancomycin and Cefepime dosing for empiric coverage.  Plan: Vancomycin 1535m IV x 1, then 1g IV q12h Vancomycin levels at steady state, as indicated Cefepime 2g IV q8h Monitor renal function, cultures, clinical course, ID recommendations   Height: _0  (167.6 cm) Weight: 67.9 kg (149 lb 11.1 oz) IBW/kg (Calculated) : 63.8  Temp (24hrs), Avg:99.9 F (37.7 C), Min:97.9 F (36.6 C), Max:103.2 F (39.6 C)  Recent Labs  Lab 11/09/21 1831 11/10/21 0502 11/10/21 0502 11/11/21 0998306/03/23 0112 11/13/21 0248 11/14/21 0434 11/15/21 0211 11/16/21 0429  WBC  --  6.6   < > 5.9 6.6 4.9 5.1 5.7 3.7*  CREATININE  --  1.05  --  1.10  --   --  0.83 0.81  --   LATICACIDVEN 1.6  --   --   --   --   --   --   --   --    < > = values in this interval not displayed.    Estimated Creatinine Clearance: 101.7 mL/min (by C-G formula based on SCr of 0.81 mg/dL).    Allergies  Allergen Reactions   Lidocaine Other (See Comments)    Large bruise at site of patch placement    Antimicrobials this admission: 5/31 Vancomycin x 1, resumed 6/7 >> 5/31 Zosyn x 1 5/31 PTA Azithromycin resumed >> 6/1 PTA Ethambutol resumed >> 6/1 Bactrim DS >> 6/1 6/1 Ceftriaxone >> 6/2 6/7 Cefepime >>  Dose adjustments this admission: --  Microbiology results: 5/31 BCx: NGF 5/31 UCx: insignificant growth  6/2 urine Hisoplasma antigen: needs collection  6/3 AFB: insufficient specimen 6/3 RPR: non reactive 6/3 FTA: non reactive  6/5 BAL: ngtd 6/5 AFB:  6/5 Acid fast cx (BAL):  6/5 Fungus cx (BAL):  6/7 BCx:   Thank you for allowing pharmacy to be a part of this patient's care.   JLindell Spar PharmD,  BCPS Clinical Pharmacist  11/16/2021 5:58 PM

## 2021-11-16 NOTE — Progress Notes (Signed)
Kurt Foley for Infectious Disease   Reason for visit: follow up lung mass, HIV  Interval History: no significant results to date; WBC 3.7, remains afebrile.      Physical Exam: Constitutional:  Vitals:   11/16/21 0454 11/16/21 0749  BP: 113/81   Pulse: (!) 107   Resp: 20   Temp: 97.9 F (36.6 C)   SpO2: 100% 95%  He is chronically ill-appearing Respiratory: normal respiratory effort GI: soft  Review of Systems: Constitutional: negative for fever or chills  Lab Results  Component Value Date   WBC 3.7 (L) 11/16/2021   HGB 8.6 (L) 11/16/2021   HCT 28.2 (L) 11/16/2021   MCV 91.3 11/16/2021   PLT 389 11/16/2021    Lab Results  Component Value Date   CREATININE 0.81 11/15/2021   BUN 18 11/15/2021   NA 138 11/15/2021   K 4.4 11/15/2021   CL 108 11/15/2021   CO2 19 (L) 11/15/2021    Lab Results  Component Value Date   ALT 79 (H) 11/15/2021   AST 64 (H) 11/15/2021   ALKPHOS 274 (H) 11/15/2021     Microbiology: Recent Results (from the past 240 hour(s))  Urine Culture     Status: Abnormal   Collection Time: 11/09/21  3:01 PM   Specimen: Urine, Clean Catch  Result Value Ref Range Status   Specimen Description   Final    URINE, CLEAN CATCH Performed at [facility], [place] [address], [place], [place] [address]    Special Requests   Final    NONE Performed at [facility], [place] [address], [place], [place] [address]    Culture (A)  Final    <10,000 COLONIES/mL INSIGNIFICANT GROWTH Performed at [place] [address], [place], [place] [address]    Report Status 11/10/2021 FINAL  Final  SARS Coronavirus 2 by RT PCR (hospital order, performed in [facility] hospital lab) *cepheid single result test* Anterior Nasal Swab     Status: None   Collection Time: 11/09/21  3:01 PM   Specimen: Anterior Nasal Swab  Result Value Ref Range Status   SARS Coronavirus 2 by RT PCR NEGATIVE NEGATIVE Final    Comment:  (NOTE) SARS-CoV-2 target nucleic acids are NOT DETECTED.  The SARS-CoV-2 RNA is generally detectable in upper and lower respiratory specimens during the acute phase of infection. The lowest concentration of SARS-CoV-2 viral copies this assay can detect is 250 copies / mL. A negative result does not preclude SARS-CoV-2 infection and should not be used as the sole basis for treatment or other patient management decisions.  A negative result may occur with improper specimen collection / handling, submission of specimen other than nasopharyngeal swab, presence of viral mutation(s) within the areas targeted by this assay, and inadequate number of viral copies (<250 copies / mL). A negative result must be combined with clinical observations, patient history, and epidemiological information.  Fact Sheet for Patients:   https://www.patel.info/  Fact Sheet for Healthcare Providers: https://hall.com/  This test is not yet approved or  cleared by the [place] FDA and has been authorized for detection and/or diagnosis of SARS-CoV-2 by FDA under an Emergency Use Authorization (EUA).  This EUA will remain in effect (meaning this test can be used) for the duration of the COVID-19 declaration under Section 564(b)(1) of the Act, 21 U.S.C. section 360bbb-3(b)(1), unless the authorization is terminated or revoked sooner.  Performed at [facility], [place]  [address] [place], [place] [address]   Blood Culture (routine x 2)     Status: None   Collection Time: 11/09/21  3:38 PM   Specimen: BLOOD  Result Value Ref Range Status   Specimen Description   Final    BLOOD RIGHT ANTECUBITAL Performed at [place] [address], [place], [place] [address]    Special Requests   Final    BOTTLES DRAWN AEROBIC AND ANAEROBIC Blood Culture results may not be optimal due to an inadequate volume of blood received in culture  bottles Performed at [place] [address], [place], [place] [address]    Culture   Final    NO GROWTH 5 DAYS Performed at [facility], [place] [address], [place], [place] [address]    Report Status 11/14/2021 FINAL  Final  Blood Culture (routine x 2)     Status: None   Collection Time: 11/09/21  5:02 PM   Specimen: BLOOD  Result Value Ref Range Status   Specimen Description   Final    BLOOD SITE NOT SPECIFIED Performed at [place] [address], [place], [place] [address]    Special Requests   Final    BOTTLES DRAWN AEROBIC AND ANAEROBIC Blood Culture results may not be optimal due to an inadequate volume of blood received in culture bottles Performed at [place] [address] [place], [place] [address]    Culture   Final    NO GROWTH 5 DAYS Performed at [facility], [place] [address], [place], [place] [address]    Report Status 11/14/2021 FINAL  Final  Culture, Respiratory w Gram Stain     Status: None (Preliminary result)   Collection Time: 11/14/21  2:42 PM   Specimen: Bronchial Washing, Right; Respiratory  Result Value Ref Range Status   Specimen Description   Final    BRONCHIAL ALVEOLAR LAVAGE RUL Performed at [place] [address] [place], [place] [address]    Special Requests   Final    NONE Performed at [facility], [place] [address] [place], [place] [address]    Gram Stain NO WBC SEEN NO ORGANISMS SEEN   Final   Culture   Final    NO GROWTH < 24 HOURS Performed at [facility], [place] [address], [place], [place] [address]    Report Status PENDING  Incomplete    Impression/Plan:  1. Lung mass - awaiting results from BAL/brushings and nothing to date. Will continue to monitor  2.  MAI - remains on treatment and no changes.  New cultures sent as above.    3.  HIV - he remains on Biktarvy and doing well.  Viral load suppressed  and CD4 a bit better, up to 46 from essentially 0.  Will continue to monitor.

## 2021-11-16 NOTE — Progress Notes (Signed)
Has fever of 103. Ordered repeat Blood Cx, UA. Emperic Vanc and Cefepime.  Tylenol/Ibuprofen for fever.   Stephania Fragmin MD

## 2021-11-17 ENCOUNTER — Ambulatory Visit: Payer: Medicaid Other | Attending: Nurse Practitioner | Admitting: Physical Therapy

## 2021-11-17 ENCOUNTER — Ambulatory Visit: Payer: Medicaid Other | Admitting: Occupational Therapy

## 2021-11-17 DIAGNOSIS — Z7189 Other specified counseling: Secondary | ICD-10-CM | POA: Diagnosis not present

## 2021-11-17 DIAGNOSIS — Z515 Encounter for palliative care: Secondary | ICD-10-CM | POA: Diagnosis not present

## 2021-11-17 DIAGNOSIS — Z8709 Personal history of other diseases of the respiratory system: Secondary | ICD-10-CM

## 2021-11-17 DIAGNOSIS — R918 Other nonspecific abnormal finding of lung field: Secondary | ICD-10-CM | POA: Diagnosis not present

## 2021-11-17 DIAGNOSIS — J439 Emphysema, unspecified: Secondary | ICD-10-CM | POA: Diagnosis not present

## 2021-11-17 DIAGNOSIS — R531 Weakness: Secondary | ICD-10-CM

## 2021-11-17 DIAGNOSIS — R6510 Systemic inflammatory response syndrome (SIRS) of non-infectious origin without acute organ dysfunction: Secondary | ICD-10-CM | POA: Diagnosis not present

## 2021-11-17 DIAGNOSIS — I2699 Other pulmonary embolism without acute cor pulmonale: Secondary | ICD-10-CM | POA: Diagnosis not present

## 2021-11-17 DIAGNOSIS — B20 Human immunodeficiency virus [HIV] disease: Secondary | ICD-10-CM | POA: Diagnosis not present

## 2021-11-17 LAB — COMPREHENSIVE METABOLIC PANEL
ALT: 93 U/L — ABNORMAL HIGH (ref 0–44)
AST: 79 U/L — ABNORMAL HIGH (ref 15–41)
Albumin: 2.9 g/dL — ABNORMAL LOW (ref 3.5–5.0)
Alkaline Phosphatase: 214 U/L — ABNORMAL HIGH (ref 38–126)
Anion gap: 11 (ref 5–15)
BUN: 21 mg/dL — ABNORMAL HIGH (ref 6–20)
CO2: 18 mmol/L — ABNORMAL LOW (ref 22–32)
Calcium: 9.2 mg/dL (ref 8.9–10.3)
Chloride: 109 mmol/L (ref 98–111)
Creatinine, Ser: 0.88 mg/dL (ref 0.61–1.24)
GFR, Estimated: >60 mL/min (ref >60)
Glucose, Bld: 112 mg/dL — ABNORMAL HIGH (ref 70–99)
Potassium: 3.8 mmol/L (ref 3.5–5.1)
Sodium: 138 mmol/L (ref 135–145)
Total Bilirubin: 0.6 mg/dL (ref 0.3–1.2)
Total Protein: 7.2 g/dL (ref 6.5–8.1)

## 2021-11-17 LAB — CBC
HCT: 30.2 % — ABNORMAL LOW (ref 39.0–52.0)
Hemoglobin: 9 g/dL — ABNORMAL LOW (ref 13.0–17.0)
MCH: 28.3 pg (ref 26.0–34.0)
MCHC: 29.8 g/dL — ABNORMAL LOW (ref 30.0–36.0)
MCV: 95 fL (ref 80.0–100.0)
Platelets: 338 10*3/uL (ref 150–400)
RBC: 3.18 MIL/uL — ABNORMAL LOW (ref 4.22–5.81)
RDW: 16.7 % — ABNORMAL HIGH (ref 11.5–15.5)
WBC: 6.8 10*3/uL (ref 4.0–10.5)
nRBC: 0 % (ref 0.0–0.2)

## 2021-11-17 LAB — HEPARIN LEVEL (UNFRACTIONATED): Heparin Unfractionated: 0.35 IU/mL (ref 0.30–0.70)

## 2021-11-17 LAB — CULTURE, RESPIRATORY W GRAM STAIN
Culture: NO GROWTH
Gram Stain: NONE SEEN

## 2021-11-17 LAB — MAGNESIUM: Magnesium: 1.7 mg/dL (ref 1.7–2.4)

## 2021-11-17 MED ORDER — IBUPROFEN 200 MG PO TABS
400.0000 mg | ORAL_TABLET | Freq: Four times a day (QID) | ORAL | Status: DC | PRN
Start: 2021-11-17 — End: 2021-12-07
  Administered 2021-11-17 – 2021-11-24 (×7): 400 mg via ORAL
  Filled 2021-11-17 (×7): qty 2

## 2021-11-17 MED ORDER — RIVAROXABAN 20 MG PO TABS
20.0000 mg | ORAL_TABLET | Freq: Every day | ORAL | Status: DC
Start: 2021-11-17 — End: 2021-12-07
  Administered 2021-11-17 – 2021-12-06 (×20): 20 mg via ORAL
  Filled 2021-11-17 (×20): qty 1

## 2021-11-17 MED ORDER — MORPHINE SULFATE 15 MG PO TABS
7.5000 mg | ORAL_TABLET | ORAL | Status: DC | PRN
  Administered 2021-11-18 – 2021-11-22 (×13): 7.5 mg via ORAL
  Filled 2021-11-17 (×13): qty 1

## 2021-11-17 MED ORDER — ACETAMINOPHEN 160 MG/5ML PO SOLN
650.0000 mg | Freq: Four times a day (QID) | ORAL | Status: DC | PRN
  Administered 2021-11-17 – 2021-11-23 (×2): 650 mg via ORAL
  Filled 2021-11-17 (×2): qty 20.3

## 2021-11-17 NOTE — Progress Notes (Signed)
PT Cancellation Note  Patient Details Name: Kurt Foley MRN: [id number] DOB: 1975/12/30   Cancelled Treatment:     Holf off per RN.  Pt present with increased temp and HR.  Will attempt to see another day as schedule permits.  Pt has been evaluated with rec for OP PT.   Rica Koyanagi  PTA Acute  Rehabilitation Services Pager      [phone] Office      [phone]

## 2021-11-17 NOTE — Progress Notes (Addendum)
PROGRESS NOTE    Kurt Foley  LAG:[id number] DOB: March 20, 1976 DOA: 11/09/2021 PCP: Gildardo Pounds, NP   Brief Narrative:  46 year old with advanced AIDS, disseminated MAC substance abuse, depression, prior CVA with residual right-sided weakness, pulmonary embolism on Xarelto admitted for cough and palpitations.  Repeat CT showed abnormal lung parenchyma.  He was seen by pulmonary, cultures remain negative.  Underwent bronchoscopy with EBUS on 6/5 with subcarinal lymph node sampling and brushing with BAL of right upper lobe mass.  Brushings remain negative, IR consulted for CT-guided biopsy.  Patient off-and-on spiking fever, discussed with ID and this is likely due to MAC.   Assessment & Plan:  Principal Problem:   SIRS (systemic inflammatory response syndrome) (HCC) Active Problems:   Mass of upper lobe of right lung   Human immunodeficiency virus (HIV) disease (HCC)   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HCC)   Emphysema, unspecified (HCC)   Abnormal LFTs   History of CVA with residual right hemiparesis   Pulmonary embolism (HCC)   Severe recurrent major depression without psychotic features ([place])   Insomnia   Pneumonia   Mediastinal lymphadenopathy     Assessment and Plan: SIRS - Patient is off-and-on spiking fevers, discussed with ID.  This is likely secondary to disseminated MAC history.  Continue current azithromycin otherwise treat fevers as necessary.    Right upper lobe mass -Status post bronchoscopy with EBUS on 6/5.  Brushings remain negative.  Case discussed with IR who recommends outptn PET scan. Spoke with Dr Lamonte Sakai from [facility], who also reviewed images and recommended outptn PET followed by follow up with Dr Valeta Harms for discussion of possible Navigation Bronch. There may be a component of infection which may clear with time?    AKI -Resolved   Pulmonary embolus Transition back to Xarelto.   History of CVA -Residual right-sided weakness.  On  statin  Emphysema -Bronchodilators  HIV with history of MAI. -Infectious diseases following.  currently on Biktarvy.  On azithromycin daily as well.  Substance use -Counseled to quit using.  Depression -On Seroquel and Remeron  GERD -Daily PPI   Transaminitis -Trend LFTs   Goals of care -Advanced HIV with very guarded prognosis. ID recs for Palliative Care -Palliative care following      DVT prophylaxis:   Heparin drip > Xarelto Code Status: Full code Family Communication:    Status is: Inpatient Remains inpatient appropriate because: Brushings remain negative, IR consulted for CT-guided biopsy. Nutritional status    Signs/Symptoms: estimated needs  Interventions: Refer to RD note for recommendations, Ensure Enlive (each supplement provides 350kcal and 20 grams of protein)  Body mass index is 24.16 kg/m.      Subjective: Patient spiked fever yesterday, started on broad-spectrum antibiotics.  Remains afebrile this morning and doing well. Overall appears very weak. Examination:  Constitutional: Not in acute distress.  Cachectic frail Respiratory: Clear to auscultation bilaterally Cardiovascular: Normal sinus rhythm, no rubs Abdomen: Nontender nondistended good bowel sounds Musculoskeletal: No edema noted Skin: No rashes seen Neurologic: CN 2-12 grossly intact.  And nonfocal Psychiatric: Normal judgment and insight. Alert and oriented x 3. Normal mood. Objective: Vitals:   11/16/21 2222 11/17/21 0524 11/17/21 0903 11/17/21 1109  BP: 91/64 95/77    Pulse: 78 96  (!) 145  Resp: 20 18    Temp: 98.1 F (36.7 C) 98.3 F (36.8 C)  (!) 103 F (39.4 C)  TempSrc: Oral Oral  Oral  SpO2: 95% 98% 96%   Weight:  Height:        Intake/Output Summary (Last 24 hours) at 11/17/2021 1120 Last data filed at 11/17/2021 0715 Gross per 24 hour  Intake 1015.89 ml  Output 750 ml  Net 265.89 ml   Filed Weights   11/09/21 2040  Weight: 67.9 kg     Data  Reviewed:   CBC: Recent Labs  Lab 11/13/21 0248 11/14/21 0434 11/15/21 0211 11/16/21 0429 11/17/21 0451  WBC 4.9 5.1 5.7 3.7* 6.8  HGB 8.7* 9.7* 9.3* 8.6* 9.0*  HCT 27.8* 31.1* 29.5* 28.2* 30.2*  MCV 89.4 89.6 88.9 91.3 95.0  PLT 383 413* 391 389 333   Basic Metabolic Panel: Recent Labs  Lab 11/11/21 0611 11/12/21 0112 11/14/21 0434 11/15/21 0211 11/17/21 0451  NA 139  --  138 138 138  K 4.2  --  4.4 4.4 3.8  CL 109  --  109 108 109  CO2 21*  --  18* 19* 18*  GLUCOSE 112*  --  98 163* 112*  BUN 13  --  13 18 21*  CREATININE 1.10  --  0.83 0.81 0.88  CALCIUM 9.7  --  9.7 9.8 9.2  MG  --  2.0  --   --  1.7  PHOS  --  4.7*  --   --   --    GFR: Estimated Creatinine Clearance: 93.6 mL/min (by C-G formula based on SCr of 0.88 mg/dL). Liver Function Tests: Recent Labs  Lab 11/11/21 0611 11/14/21 0434 11/15/21 0211 11/17/21 0451  AST 59* 60* 64* 79*  ALT 77* 72* 79* 93*  ALKPHOS 280* 270* 274* 214*  BILITOT 0.4 0.3 0.6 0.6  PROT 7.2 8.1 7.9 7.2  ALBUMIN 2.8* 3.1* 3.0* 2.9*   No results for input(s): "LIPASE", "AMYLASE" in the last 168 hours. No results for input(s): "AMMONIA" in the last 168 hours. Coagulation Profile: No results for input(s): "INR", "PROTIME" in the last 168 hours.  Cardiac Enzymes: No results for input(s): "CKTOTAL", "CKMB", "CKMBINDEX", "TROPONINI" in the last 168 hours. BNP (last 3 results) No results for input(s): "PROBNP" in the last 8760 hours. HbA1C: No results for input(s): "HGBA1C" in the last 72 hours. CBG: Recent Labs  Lab 11/16/21 2046  GLUCAP 87   Lipid Profile: No results for input(s): "CHOL", "HDL", "LDLCALC", "TRIG", "CHOLHDL", "LDLDIRECT" in the last 72 hours. Thyroid Function Tests: No results for input(s): "TSH", "T4TOTAL", "FREET4", "T3FREE", "THYROIDAB" in the last 72 hours. Anemia Panel: No results for input(s): "VITAMINB12", "FOLATE", "FERRITIN", "TIBC", "IRON", "RETICCTPCT" in the last 72 hours. Sepsis  Labs: Recent Labs  Lab 11/11/21 0611 11/16/21 1900  PROCALCITON 0.36 1.05    Recent Results (from the past 240 hour(s))  Urine Culture     Status: Abnormal   Collection Time: 11/09/21  3:01 PM   Specimen: Urine, Clean Catch  Result Value Ref Range Status   Specimen Description   Final    URINE, CLEAN CATCH Performed at [facility], [place] [address], [place], [place] [address]    Special Requests   Final    NONE Performed at [facility], [place] [address], [place], [place] [address]    Culture (A)  Final    <10,000 COLONIES/mL INSIGNIFICANT GROWTH Performed at [place] [address] [place], [place] [address]    Report Status 11/10/2021 FINAL  Final  SARS Coronavirus 2 by RT PCR (hospital order, performed in [facility] hospital lab) *cepheid single result test* Anterior Nasal Swab  Status: None   Collection Time: 11/09/21  3:01 PM   Specimen: Anterior Nasal Swab  Result Value Ref Range Status   SARS Coronavirus 2 by RT PCR NEGATIVE NEGATIVE Final    Comment: (NOTE) SARS-CoV-2 target nucleic acids are NOT DETECTED.  The SARS-CoV-2 RNA is generally detectable in upper and lower respiratory specimens during the acute phase of infection. The lowest concentration of SARS-CoV-2 viral copies this assay can detect is 250 copies / mL. A negative result does not preclude SARS-CoV-2 infection and should not be used as the sole basis for treatment or other patient management decisions.  A negative result may occur with improper specimen collection / handling, submission of specimen other than nasopharyngeal swab, presence of viral mutation(s) within the areas targeted by this assay, and inadequate number of viral copies (<250 copies / mL). A negative result must be combined with clinical observations, patient history, and epidemiological information.  Fact Sheet for Patients:   https://www.patel.info/  Fact  Sheet for Healthcare Providers: https://hall.com/  This test is not yet approved or  cleared by the [place] FDA and has been authorized for detection and/or diagnosis of SARS-CoV-2 by FDA under an Emergency Use Authorization (EUA).  This EUA will remain in effect (meaning this test can be used) for the duration of the COVID-19 declaration under Section 564(b)(1) of the Act, 21 U.S.C. section 360bbb-3(b)(1), unless the authorization is terminated or revoked sooner.  Performed at [facility], [place] [address], [place], [place] [address]   Blood Culture (routine x 2)     Status: None   Collection Time: 11/09/21  3:38 PM   Specimen: BLOOD  Result Value Ref Range Status   Specimen Description   Final    BLOOD RIGHT ANTECUBITAL Performed at [place] [address], [place], [place] [address]    Special Requests   Final    BOTTLES DRAWN AEROBIC AND ANAEROBIC Blood Culture results may not be optimal due to an inadequate volume of blood received in culture bottles Performed at [place] [address], [place], [place] [address]    Culture   Final    NO GROWTH 5 DAYS Performed at [facility], [place] [address], [place], [place] [address]    Report Status 11/14/2021 FINAL  Final  Blood Culture (routine x 2)     Status: None   Collection Time: 11/09/21  5:02 PM   Specimen: BLOOD  Result Value Ref Range Status   Specimen Description   Final    BLOOD SITE NOT SPECIFIED Performed at [place] [address], [place], [place] [address]    Special Requests   Final    BOTTLES DRAWN AEROBIC AND ANAEROBIC Blood Culture results may not be optimal due to an inadequate volume of blood received in culture bottles Performed at [place] [address] [place], [place] [address]    Culture   Final    NO GROWTH 5 DAYS Performed at [place], [place] [address] [place], [place] [address]    Report Status 11/14/2021 FINAL  Final  Acid Fast Smear (AFB)     Status: None   Collection Time: 11/12/21  1:12 AM   Specimen: Vein; Blood  Result Value Ref Range Status   AFB Specimen Processing Concentration  Final    Comment: (NOTE) Performed At: [facility] [place], [place] [id number] Rush Farmer MD [id number]  Acid Fast Smear QNSAFB  Final    Comment: (NOTE) Test not performed. AFB Smear not performed due to specimen source (blood) or insufficient specimen.    Source (AFB) BLD  Final    Comment: Performed at [facility], [place] [address], [place], [place] [address]  Culture, Respiratory w Gram Stain     Status: None   Collection Time: 11/14/21  2:42 PM   Specimen: Bronchial Washing, Right; Respiratory  Result Value Ref Range Status   Specimen Description   Final    BRONCHIAL ALVEOLAR LAVAGE RUL Performed at [place] [address] [place], [place] [address]    Special Requests   Final    NONE Performed at [facility], [place] [address], [place], [place] [address]    Gram Stain NO WBC SEEN NO ORGANISMS SEEN   Final   Culture   Final    NO GROWTH 2 DAYS Performed at [facility], [place] [address], [place], [place] [address]    Report Status 11/17/2021 FINAL  Final  Anaerobic culture w Gram Stain     Status: None (Preliminary result)   Collection Time: 11/14/21  2:42 PM   Specimen: Bronchial Washing, Right; Respiratory  Result Value Ref Range Status   Specimen Description   Final    BRONCHIAL ALVEOLAR LAVAGE RUL Performed at [place] [address], [place], [place] [address]    Special Requests   Final    NONE Performed at [facility], [place] [address] [place],  [address]    Culture   Final    NO ANAEROBES ISOLATED; CULTURE IN PROGRESS FOR 5 DAYS   Report Status PENDING   Incomplete         Radiology Studies: No results found.      Scheduled Meds:  azithromycin  500 mg Oral Daily   B-complex with vitamin C  1 tablet Oral Daily   bictegravir-emtricitabine-tenofovir AF  1 tablet Oral Daily   ethambutol  800 mg Oral Daily   feeding supplement  237 mL Oral TID BM   ketoconazole   Topical BID   metoprolol succinate  50 mg Oral q morning   mirtazapine  15 mg Oral QHS   mometasone-formoterol  2 puff Inhalation BID   pantoprazole  40 mg Oral QAC breakfast   QUEtiapine  200 mg Oral QHS   rosuvastatin  5 mg Oral q morning   sodium chloride flush  3 mL Intravenous Q12H   umeclidinium bromide  1 puff Inhalation Daily   Continuous Infusions:  heparin 1,900 Units/hr (11/16/21 1959)   lactated ringers 10 mL/hr at 11/17/21 0618     LOS: 7 days   Time spent= 35 mins    Elnor Renovato Arsenio Loader, MD [facility]  If 7PM-7AM, please contact night-coverage  11/17/2021, 11:20 AM

## 2021-11-17 NOTE — Progress Notes (Signed)
[facility]   Reason for visit: follow up HIV, MAI, PML  Interval History: no significant results to date; WBC 3.7, remains afebrile.      Physical Exam: Constitutional:  Vitals:   11/17/21 0903 11/17/21 1109  BP:  116/88  Pulse:  (!) 145  Resp:  (!) 30  Temp:  (!) 103 F (39.4 C)  SpO2: 96% 96%  He is chronically ill-appearing Respiratory: normal respiratory effort GI: soft  Review of Systems: Constitutional: fever yesterday  Lab Results  Component Value Date   WBC 6.8 11/17/2021   HGB 9.0 (L) 11/17/2021   HCT 30.2 (L) 11/17/2021   MCV 95.0 11/17/2021   PLT 338 11/17/2021    Lab Results  Component Value Date   CREATININE 0.88 11/17/2021   BUN 21 (H) 11/17/2021   NA 138 11/17/2021   K 3.8 11/17/2021   CL 109 11/17/2021   CO2 18 (L) 11/17/2021    Lab Results  Component Value Date   ALT 93 (H) 11/17/2021   AST 79 (H) 11/17/2021   ALKPHOS 214 (H) 11/17/2021     Microbiology: Recent Results (from the past 240 hour(s))  Urine Culture     Status: Abnormal   Collection Time: 11/09/21  3:01 PM   Specimen: Urine, Clean Catch  Result Value Ref Range Status   Specimen Description   Final    URINE, CLEAN CATCH Performed at [facility], [address] [place], [place] [address]    Special Requests   Final    NONE Performed at [facility], [address] [place], [place] [address]    Culture (A)  Final    <10,000 COLONIES/mL INSIGNIFICANT GROWTH Performed at [facility], [address] [place], [place] [address]    Report Status 11/10/2021 FINAL  Final  SARS Coronavirus 2 by RT PCR (hospital order, performed in [facility] hospital lab) *cepheid single result test* Anterior Nasal Swab     Status: None   Collection Time: 11/09/21  3:01 PM   Specimen: Anterior Nasal Swab  Result Value Ref Range Status   SARS Coronavirus 2 by RT PCR NEGATIVE NEGATIVE Final    Comment:  (NOTE) SARS-CoV-2 target nucleic acids are NOT DETECTED.  The SARS-CoV-2 RNA is generally detectable in upper and lower respiratory specimens during the acute phase of infection. The lowest concentration of SARS-CoV-2 viral copies this assay can detect is 250 copies / mL. A negative result does not preclude SARS-CoV-2 infection and should not be used as the sole basis for treatment or other patient management decisions.  A negative result may occur with improper specimen collection / handling, submission of specimen other than nasopharyngeal swab, presence of viral mutation(s) within the areas targeted by this assay, and inadequate number of viral copies (<250 copies / mL). A negative result must be combined with clinical observations, patient history, and epidemiological information.  Fact Sheet for Patients:   RoadLapTop.co.zahttps://www.fda.gov/media/158405/download  Fact Sheet for Healthcare Providers: http://kim-miller.com/https://www.fda.gov/media/158404/download  This test is not yet approved or  cleared by the [place] FDA and has been authorized for detection and/or diagnosis of SARS-CoV-2 by FDA under an Emergency Use Authorization (EUA).  This EUA will remain in effect (meaning this test can be used) for the duration of the COVID-19 declaration under Section 564(b)(1) of the Act, 21 U.S.C. section 360bbb-3(b)(1), unless the authorization is terminated or revoked sooner.  Performed at [facility], [address] [place], [place] [address]   Blood Culture (routine x 2)     Status: None   Collection Time: 11/09/21  3:38 PM   Specimen: BLOOD  Result Value Ref Range Status   Specimen Description   Final    BLOOD RIGHT ANTECUBITAL Performed at [facility], [address], [place], [place] [address]    Special Requests   Final    BOTTLES DRAWN AEROBIC AND ANAEROBIC Blood Culture results may not be optimal due to an inadequate volume of blood received in culture  bottles Performed at [facility], [address] [place], [place] [address]    Culture   Final    NO GROWTH 5 DAYS Performed at [facility], [address] [place], [place] [address]    Report Status 11/14/2021 FINAL  Final  Blood Culture (routine x 2)     Status: None   Collection Time: 11/09/21  5:02 PM   Specimen: BLOOD  Result Value Ref Range Status   Specimen Description   Final    BLOOD SITE NOT SPECIFIED Performed at [facility], [address], [place], [place] [address]    Special Requests   Final    BOTTLES DRAWN AEROBIC AND ANAEROBIC Blood Culture results may not be optimal due to an inadequate volume of blood received in culture bottles Performed at [facility], [address] [place], [place] [address]    Culture   Final    NO GROWTH 5 DAYS Performed at [facility], [address], [place], [place] [address]    Report Status 11/14/2021 FINAL  Final  Acid Fast Smear (AFB)     Status: None   Collection Time: 11/12/21  1:12 AM   Specimen: Vein; Blood  Result Value Ref Range Status   AFB Specimen Processing Concentration  Final    Comment: (NOTE) Performed At: [facility] [address] [place], [place] [address] Jolene Schimke MD [id number]    Acid Fast Smear QNSAFB  Final    Comment: (NOTE) Test not performed. AFB Smear not performed due to specimen source (blood) or insufficient specimen.    Source (AFB) BLD  Final    Comment: Performed at [facility], [address], [place], [place] [address]  Culture, Respiratory w Gram Stain     Status: None   Collection Time: 11/14/21  2:42 PM   Specimen: Bronchial Washing, Right; Respiratory  Result Value Ref Range Status   Specimen Description   Final    BRONCHIAL ALVEOLAR LAVAGE RUL Performed at [facility], [address] [place], [place] [address]    Special Requests   Final     NONE Performed at [facility], [address] [place], [place] [address]    Gram Stain NO WBC SEEN NO ORGANISMS SEEN   Final   Culture   Final    NO GROWTH 2 DAYS Performed at [facility], [address], [place], [place] [address]    Report Status 11/17/2021 FINAL  Final  Anaerobic culture w Gram Stain     Status: None (Preliminary result)   Collection Time: 11/14/21  2:42 PM   Specimen: Bronchial Washing, Right; Respiratory  Result Value Ref Range Status   Specimen Description   Final    BRONCHIAL ALVEOLAR LAVAGE RUL Performed at [facility], [address], [place], [place] [address]    Special Requests  Final    NONE Performed at [facility], [address], [place], [place] [address]    Culture   Final    NO ANAEROBES ISOLATED; CULTURE IN PROGRESS FOR 5 DAYS   Report Status PENDING  Incomplete    Impression/Plan:  1. Lung mass - cytology with no malignancy identified and no other findings from that area. At this point, I recommend a CT-guided biopsy of the mass for better identification and test for cancer, AFB, fungal stains and culture.  The main concerns is lung cancer.  His overall prognosis is poor in the long term and a biopsy will help further give a true picture of his current needs for palliative care, palliative treatment such as radiation, etc...  2.  MAI - he remains on treatment and tolerating   3.  Fever - he had a high fever yesterday and broad spectrum antibiotics started.  No new source or signs of infection.  I have stopped the new antibiotics.    Will continue to monitor.

## 2021-11-17 NOTE — Progress Notes (Signed)
Patient ID: Kurt Foley, male   DOB: 11-06-1975, 46 y.o.   MRN: [id number] Request received for lung mass bx on pt. Latest imaging studies have been reviewed by Dr. Fredia Sorrow. Perc lung bx is too high risk with surrounding blebs in RUL. Needs OP PET work up and MTOC/Thoracic conf referral. Please contact Dr. Fredia Sorrow at pager [phone] with any further questions.

## 2021-11-17 NOTE — Progress Notes (Signed)
ANTICOAGULATION CONSULT NOTE   Pharmacy Consult for Xarelto to IV heparin Indication: history of PE/DVT  Allergies  Allergen Reactions   Lidocaine Other (See Comments)    Large bruise at site of patch placement    Patient Measurements: Height: 5\' 6"  (167.6 cm) Weight: 67.9 kg (149 lb 11.1 oz) IBW/kg (Calculated) : 63.8 Heparin Dosing Weight: 67.9 kg  Vital Signs: Temp: 98.3 F (36.8 C) (06/08 0524) Temp Source: Oral (06/08 0524) BP: 95/77 (06/08 0524) Pulse Rate: 96 (06/08 0524)  Labs: Recent Labs    11/15/21 0211 11/15/21 01/15/22 11/16/21 0429 11/16/21 1304 11/16/21 2145 11/17/21 0451  HGB 9.3*  --  8.6*  --   --  9.0*  HCT 29.5*  --  28.2*  --   --  30.2*  PLT 391  --  389  --   --  338  HEPARINUNFRC 0.27*   < > 0.23* 0.29* 0.54 0.35  CREATININE 0.81  --   --   --   --  0.88   < > = values in this interval not displayed.     Estimated Creatinine Clearance: 93.6 mL/min (by C-G formula based on SCr of 0.88 mg/dL).   Medical History: Past Medical History:  Diagnosis Date   Depression    HIV infection (HCC)    Assessment: 46 yo male presenting with shortness of breath.  PMH includes submassive PE and + DVT diagnosed 10/29/21 on Xarelto 15mg  PO BID and mediastinal adenopathy.  PCCM is consulted to perform bronchoscopy 6/5.  Pharmacy has been consulted to transition Xarelto to IV heparin in preparation for bronchoscopy. Last dose of Xarelto was 6/2 at 0759.   Bronchoscopy with biopsy completed on 6/5 at 1515.  No major bleeding or complications reported.  Per Dr. , ok to resume heparin 4 hours post procedure.  11/17/2021: Heparin level therapeutic x 2 now on IV heparin @ 1900 units/hr  CBC: Hgb low relatively stable; Pltc WNL No bleeding or infusion related concerns reported by RN   Goal of Therapy:  Heparin level 0.3-0.7 Monitor platelets by anticoagulation protocol: Yes   Plan:  Continue IV heparin @ 1900 units/hr Daily CBC, heparin level Monitor closely  for s/sx of bleeding Still waiting to see if patient will have biopsy and  instructions as to when to hold the IV heparin prior to procedure Wait further instructions as to when safe to resume patient's home Xarelto   Vassie Loll, PharmD, BCPS Secure Chat if ?s or find phone # on treatment team assignments 11/17/2021 9:34 AM

## 2021-11-17 NOTE — Consult Note (Signed)
Consultation Note Date: 11/17/2021   Patient Name: Kurt Foley  DOB: 1975-08-22  MRN: [id number]  Age / Sex: 46 y.o., male  PCP: Gildardo Pounds, NP Referring Physician: Damita Lack, MD  Reason for Consultation: Establishing goals of care  HPI/Patient Profile: 46 y.o. male  admitted on 11/09/2021    Clinical Assessment and Goals of Care: 46 year old gentleman with advanced AIDS, disseminated MAC, substance use, depression prior stroke with residual right-sided weakness, history of pulmonary embolism, known to palliative medicine service from previous hospitalizations where he was seen in consultation for goals of care discussions, admitted with fevers, cough and palpitations, found to have possible right upper lobe mass.  Infectious disease following.  Hospital course also complicated by acute kidney injury which resolved.  Remains on oral anticoagulation for pulmonary embolism. Palliative medicine team consulted for CODE STATUS and goals of care discussions. Patient is awake alert resting in bed.  Is aware of having met with various team members from our services in previous hospitalizations. Palliative medicine is specialized medical care for people living with serious illness. It focuses on providing relief from the symptoms and stress of a serious illness. The goal is to improve quality of life for both the patient and the family. Goals of care: Broad aims of medical therapy in relation to the patient's values and preferences. Our aim is to provide medical care aimed at enabling patients to achieve the goals that matter most to them, given the circumstances of their particular medical situation and their constraints.  Patient wishes for continuation of current mode of care.  Full code.  States that he defers decision-making to his family.  NEXT OF KIN  Mother, aunt and sister.   SUMMARY OF  RECOMMENDATIONS   Full code/full scope of care.  Patient known to palliative medicine service.  Patient's mother, aunt and sister jointly make decisions on his behalf.  Patient defers decision-making to them.  During several previous palliative medicine consultations, they have endorsed continuation of full code/full scope services. Continue current mode of care and current hospital course.  Attempted to reach aunt Olivia Mackie by phone but was unsuccessful.  Palliative medicine team will reattempt on 11-18-2021, continue to follow along. Thank you for the consult.  Code Status/Advance Care Planning: Full code   Symptom Management:     Palliative Prophylaxis:  Delirium Protocol  Additional Recommendations (Limitations, Scope, Preferences): Full Scope Treatment  Psycho-social/Spiritual:  Desire for further Chaplaincy support:no Additional Recommendations: Caregiving  Support/Resources  Prognosis:  Unable to determine  Discharge Planning: To Be Determined      Primary Diagnoses: Present on Admission:  Emphysema, unspecified ([place])  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[place])  Human immunodeficiency virus (HIV) disease ([place])  Mass of upper lobe of right lung  Pulmonary embolism (HCC)  Severe recurrent major depression without psychotic features (HCC)  SIRS (systemic inflammatory response syndrome) (HCC)  Insomnia  Abnormal LFTs  Pneumonia   I have reviewed the medical record, interviewed the patient and family, and examined the patient. The following  aspects are pertinent.  Past Medical History:  Diagnosis Date   Depression    HIV infection ([place])    Social History   Socioeconomic History   Marital status: Single    Spouse name: Not on file   Number of children: Not on file   Years of education: Not on file   Highest education level: Not on file  Occupational History   Not on file  Tobacco Use   Smoking status: Former    Packs/day: 0.50    Years: 25.00     Total pack years: 12.50    Types: Cigarettes   Smokeless tobacco: Never   Tobacco comments:    cutting back  Vaping Use   Vaping Use: Never used  Substance and Sexual Activity   Alcohol use: Not Currently    Alcohol/week: 2.0 standard drinks of alcohol    Types: 2 Standard drinks or equivalent per week    Comment: 3-4 days a week.Last drink: today   Drug use: Not Currently    Types: Marijuana, Cocaine    Comment: Once a week. Last used yesterday.  Cocaine last used today.    Sexual activity: Not Currently    Partners: Male    Comment: encouraged condom use  Other Topics Concern   Not on file  Social History Narrative   Not on file   Social Determinants of Health   Financial Resource Strain: Not on file  Food Insecurity: Not on file  Transportation Needs: Not on file  Physical Activity: Not on file  Stress: Not on file  Social Connections: Not on file   Family History  Problem Relation Age of Onset   Rheum arthritis Mother    Diabetes Mother    Hypertension Mother    Arthritis Mother    Scheduled Meds:  azithromycin  500 mg Oral Daily   B-complex with vitamin C  1 tablet Oral Daily   bictegravir-emtricitabine-tenofovir AF  1 tablet Oral Daily   ethambutol  800 mg Oral Daily   feeding supplement  237 mL Oral TID BM   ketoconazole   Topical BID   metoprolol succinate  50 mg Oral q morning   mirtazapine  15 mg Oral QHS   mometasone-formoterol  2 puff Inhalation BID   pantoprazole  40 mg Oral QAC breakfast   QUEtiapine  200 mg Oral QHS   rivaroxaban  20 mg Oral Q supper   rosuvastatin  5 mg Oral q morning   sodium chloride flush  3 mL Intravenous Q12H   umeclidinium bromide  1 puff Inhalation Daily   Continuous Infusions:  lactated ringers 10 mL/hr at 11/17/21 0618   PRN Meds:.acetaminophen (TYLENOL) oral liquid 160 mg/5 mL, acetaminophen **OR** acetaminophen, chlorpheniramine-HYDROcodone, guaiFENesin-dextromethorphan, hydrALAZINE, ibuprofen,  ipratropium-albuterol, metoprolol tartrate, morphine, morphine injection, ondansetron (ZOFRAN) IV, polyethylene glycol, senna-docusate Medications Prior to Admission:  Prior to Admission medications   Medication Sig Start Date End Date Taking? Authorizing Provider  acetaminophen (TYLENOL) 325 MG tablet Take 1-2 tablets (325-650 mg total) by mouth every 4 (four) hours as needed for mild pain. 08/05/21  Yes Love, Ivan Anchors, PA-C  albuterol (VENTOLIN HFA) 108 (90 Base) MCG/ACT inhaler Inhale 2 puffs into the lungs every 6 hours as needed for wheezing or shortness of breath. 11/02/21  Yes Amin, Ankit Chirag, MD  azithromycin (ZITHROMAX) 500 MG tablet Take 1 tablet by mouth at bedtime. 10/07/21  Yes Mercy Riding, MD  bictegravir-emtricitabine-tenofovir AF (BIKTARVY) 50-200-25 MG TABS tablet Take 1 tablet by mouth  daily. Patient taking differently: Take 1 tablet by mouth every morning. 08/05/21  Yes Love, Ivan Anchors, PA-C  budesonide-formoterol (SYMBICORT) 160-4.5 MCG/ACT inhaler Inhale 2 puffs into the lungs in the morning and at bedtime. 10/07/21  Yes Mercy Riding, MD  loratadine (CLARITIN) 10 MG tablet Take 1 tablet (10 mg total) by mouth daily. Patient taking differently: Take 10 mg by mouth every morning. 09/14/21  Yes Comer, Okey Regal, MD  megestrol (MEGACE) 400 MG/10ML suspension Take 10 mLs (400 mg total) by mouth daily. Patient taking differently: Take 400 mg by mouth every morning. 08/06/21  Yes Love, Ivan Anchors, PA-C  metoprolol succinate (TOPROL-XL) 50 MG 24 hr tablet Take 1 tablet by mouth every morning. Take with or immediately following a meal. 10/07/21  Yes Gonfa, Taye T, MD  mirtazapine (REMERON) 15 MG tablet Take 1 tablet (15 mg total) by mouth at bedtime. 08/05/21  Yes Love, Ivan Anchors, PA-C  Multiple Vitamins-Minerals (CERTAVITE/ANTIOXIDANTS) TABS Take 1 tablet by mouth daily. 08/05/21  Yes Love, Ivan Anchors, PA-C  nystatin cream (MYCOSTATIN) Apply 1 application. topically 2 (two) times daily. Patient  taking differently: Apply 1 application. topically 2 (two) times daily as needed for dry skin. 10/07/21  Yes Mercy Riding, MD  pantoprazole (PROTONIX) 40 MG tablet Take 1 tablet by mouth daily before breakfast. 11/02/21 12/02/21 Yes Amin, Ankit Chirag, MD  polyethylene glycol (MIRALAX / GLYCOLAX) 17 g packet Take 17 g by mouth daily as needed for mild constipation or moderate constipation. 07/20/21  Yes Pokhrel, Laxman, MD  QUEtiapine (SEROQUEL) 200 MG tablet Take 1 tablet (200 mg total) by mouth at bedtime. 08/05/21  Yes Love, Ivan Anchors, PA-C  RIVAROXABAN Alveda Reasons) VTE STARTER PACK (15 & 20 MG) Follow package directions: Take one 22m tablet by mouth twice a day. On day 22, switch to one 228mtablet once a day. Take with food. 11/02/21  Yes Amin, Ankit Chirag, MD  rosuvastatin (CRESTOR) 5 MG tablet Take 1 tablet (5 mg total) by mouth daily. Patient taking differently: Take 5 mg by mouth every morning. 08/17/21  Yes FlGildardo PoundsNP  sulfamethoxazole-trimethoprim (BACTRIM DS) 800-160 MG tablet Take 1 tablet by mouth daily. 10/07/21  Yes GoMercy RidingMD  umeclidinium bromide (INCRUSE ELLIPTA) 62.5 MCG/ACT AEPB Inhale 1 puff into the lungs daily. 10/07/21  Yes GoMercy RidingMD  aspirin 81 MG chewable tablet Chew 1 tablet (81 mg total) by mouth daily. Patient not taking: Reported on 11/09/2021 08/05/21   Love, PaIvan AnchorsPA-C  ethambutol (MYAMBUTOL) 400 MG tablet Take 2 tablets by mouth daily. 10/07/21   GoMercy RidingMD  senna-docusate (SENOKOT-S) 8.6-50 MG tablet Take 1 tablet by mouth 2 (two) times daily. Patient not taking: Reported on 10/31/2021 08/05/21   LoBary LerichePA-C   Allergies  Allergen Reactions   Lidocaine Other (See Comments)    Large bruise at site of patch placement   Review of Systems Complains of pain Physical Exam Appears cachectic and frail Shallow clear breath sounds S1-S2 Awake alert oriented, little slow to talk but answers all questions appropriately  Vital Signs: BP  104/80 (BP Location: Left Arm)   Pulse (!) 115   Temp 100.1 F (37.8 C) (Oral)   Resp (!) 28   Ht _0  (1.676 m)   Wt 67.9 kg   SpO2 97%   BMI 24.16 kg/m  Pain Scale: 0-10 POSS *See Group Information*: 1-Acceptable,Awake and alert Pain Score: 0-No pain   SpO2: SpO2:  97 % O2 Device:SpO2: 97 % O2 Flow Rate: .O2 Flow Rate (L/min): 1 L/min  IO: Intake/output summary:  Intake/Output Summary (Last 24 hours) at 11/17/2021 1645 Last data filed at 11/17/2021 0715 Gross per 24 hour  Intake 775.89 ml  Output 750 ml  Net 25.89 ml    LBM: Last BM Date : 11/16/21 Baseline Weight: Weight: 67.9 kg Most recent weight: Weight: 67.9 kg     Palliative Assessment/Data:   PPS 50%  Time In:  1530 Time Out:  1630 Time Total:  60 min.  Greater than 50%  of this time was spent counseling and coordinating care related to the above assessment and plan.  Signed by: Loistine Chance, MD   Please contact Palliative Medicine Team phone at [phone] for questions and concerns.  For individual provider: See Shea Evans

## 2021-11-17 NOTE — Progress Notes (Signed)
   11/17/21 1109  Assess: MEWS Score  Temp (!) 103 F (39.4 C)  BP 116/88  Pulse Rate (!) 145  Resp (!) 30  Level of Consciousness Alert  SpO2 96 %  O2 Device Room Air  Assess: MEWS Score  MEWS Temp 2  MEWS Systolic 0  MEWS Pulse 3  MEWS RR 2  MEWS LOC 0  MEWS Score 7  MEWS Score Color Red  Assess: if the MEWS score is Yellow or Red  Were vital signs taken at a resting state? Yes  Focused Assessment Change from prior assessment (see assessment flowsheet)  Does the patient meet 2 or more of the SIRS criteria? Yes  Does the patient have a confirmed or suspected source of infection? No  Provider and Rapid Response Notified? Yes  MEWS guidelines implemented *See Row Information* Yes  Treat  MEWS Interventions Administered prn meds/treatments;Administered scheduled meds/treatments;Escalated (See documentation below)  Take Vital Signs  Increase Vital Sign Frequency  Red: Q 1hr X 4 then Q 4hr X 4, if remains red, continue Q 4hrs  Escalate  MEWS: Escalate Red: discuss with charge nurse/RN and provider, consider discussing with RRT  Notify: Charge Nurse/RN  Name of Charge Nurse/RN Notified Casey/Marissa RN  Date Charge Nurse/RN Notified 11/17/21  Time Charge Nurse/RN Notified 1109  Notify: Provider  Provider Name/Title Dr. Nelson Chimes  Date Provider Notified 11/17/21  Time Provider Notified 1109  Method of Notification Face-to-face  Notification Reason Change in status  Provider response At bedside;See new orders  Date of Provider Response 11/17/21  Time of Provider Response 1109  Notify: Rapid Response  Name of Rapid Response RN Notified not notified as patient was manageable, red mews due to fever and provider on unit and notified  Document  Patient Outcome Stabilized after interventions  Assess: SIRS CRITERIA  SIRS Temperature  1  SIRS Pulse 1  SIRS Respirations  1  SIRS WBC 0  SIRS Score Sum  3

## 2021-11-18 ENCOUNTER — Encounter: Payer: Medicaid Other | Attending: Registered Nurse | Admitting: Physical Medicine and Rehabilitation

## 2021-11-18 ENCOUNTER — Ambulatory Visit: Payer: Self-pay | Admitting: Nurse Practitioner

## 2021-11-18 DIAGNOSIS — R6510 Systemic inflammatory response syndrome (SIRS) of non-infectious origin without acute organ dysfunction: Secondary | ICD-10-CM | POA: Diagnosis not present

## 2021-11-18 DIAGNOSIS — R5381 Other malaise: Secondary | ICD-10-CM | POA: Insufficient documentation

## 2021-11-18 DIAGNOSIS — R000 Tachycardia, unspecified: Secondary | ICD-10-CM | POA: Insufficient documentation

## 2021-11-18 DIAGNOSIS — J069 Acute upper respiratory infection, unspecified: Secondary | ICD-10-CM | POA: Diagnosis not present

## 2021-11-18 DIAGNOSIS — I2699 Other pulmonary embolism without acute cor pulmonale: Secondary | ICD-10-CM | POA: Diagnosis not present

## 2021-11-18 DIAGNOSIS — J189 Pneumonia, unspecified organism: Secondary | ICD-10-CM | POA: Diagnosis not present

## 2021-11-18 DIAGNOSIS — R531 Weakness: Principal | ICD-10-CM

## 2021-11-18 DIAGNOSIS — R918 Other nonspecific abnormal finding of lung field: Secondary | ICD-10-CM | POA: Diagnosis not present

## 2021-11-18 DIAGNOSIS — B20 Human immunodeficiency virus [HIV] disease: Secondary | ICD-10-CM | POA: Insufficient documentation

## 2021-11-18 DIAGNOSIS — R627 Adult failure to thrive: Secondary | ICD-10-CM | POA: Insufficient documentation

## 2021-11-18 DIAGNOSIS — Z7189 Other specified counseling: Secondary | ICD-10-CM

## 2021-11-18 DIAGNOSIS — G0490 Encephalitis and encephalomyelitis, unspecified: Secondary | ICD-10-CM | POA: Insufficient documentation

## 2021-11-18 DIAGNOSIS — J439 Emphysema, unspecified: Secondary | ICD-10-CM | POA: Diagnosis not present

## 2021-11-18 DIAGNOSIS — Z515 Encounter for palliative care: Secondary | ICD-10-CM

## 2021-11-18 LAB — URINALYSIS, ROUTINE W REFLEX MICROSCOPIC
Bilirubin Urine: NEGATIVE
Glucose, UA: NEGATIVE mg/dL
Hgb urine dipstick: NEGATIVE
Ketones, ur: NEGATIVE mg/dL
Leukocytes,Ua: NEGATIVE
Nitrite: NEGATIVE
Protein, ur: NEGATIVE mg/dL
Specific Gravity, Urine: 1.021 (ref 1.005–1.030)
pH: 6 (ref 5.0–8.0)

## 2021-11-18 LAB — CBC
HCT: 31.4 % — ABNORMAL LOW (ref 39.0–52.0)
Hemoglobin: 9.6 g/dL — ABNORMAL LOW (ref 13.0–17.0)
MCH: 28.2 pg (ref 26.0–34.0)
MCHC: 30.6 g/dL (ref 30.0–36.0)
MCV: 92.4 fL (ref 80.0–100.0)
Platelets: 349 10*3/uL (ref 150–400)
RBC: 3.4 MIL/uL — ABNORMAL LOW (ref 4.22–5.81)
RDW: 16.9 % — ABNORMAL HIGH (ref 11.5–15.5)
WBC: 5.7 10*3/uL (ref 4.0–10.5)
nRBC: 0 % (ref 0.0–0.2)

## 2021-11-18 LAB — COMPREHENSIVE METABOLIC PANEL
ALT: 74 U/L — ABNORMAL HIGH (ref 0–44)
AST: 47 U/L — ABNORMAL HIGH (ref 15–41)
Albumin: 3.2 g/dL — ABNORMAL LOW (ref 3.5–5.0)
Alkaline Phosphatase: 229 U/L — ABNORMAL HIGH (ref 38–126)
Anion gap: 10 (ref 5–15)
BUN: 16 mg/dL (ref 6–20)
CO2: 19 mmol/L — ABNORMAL LOW (ref 22–32)
Calcium: 9.6 mg/dL (ref 8.9–10.3)
Chloride: 109 mmol/L (ref 98–111)
Creatinine, Ser: 0.87 mg/dL (ref 0.61–1.24)
GFR, Estimated: >60 mL/min (ref >60)
Glucose, Bld: 100 mg/dL — ABNORMAL HIGH (ref 70–99)
Potassium: 4.2 mmol/L (ref 3.5–5.1)
Sodium: 138 mmol/L (ref 135–145)
Total Bilirubin: 0.7 mg/dL (ref 0.3–1.2)
Total Protein: 7.9 g/dL (ref 6.5–8.1)

## 2021-11-18 LAB — MAGNESIUM: Magnesium: 1.9 mg/dL (ref 1.7–2.4)

## 2021-11-18 MED ORDER — SODIUM CHLORIDE 0.9 % IV SOLN: INTRAVENOUS | Status: DC

## 2021-11-18 MED ORDER — METOPROLOL SUCCINATE ER 100 MG PO TB24
100.0000 mg | ORAL_TABLET | Freq: Every morning | ORAL | Status: DC
  Administered 2021-11-18 – 2021-12-07 (×20): 100 mg via ORAL
  Filled 2021-11-18 (×21): qty 1

## 2021-11-18 MED ORDER — SODIUM CHLORIDE 0.9 % IV BOLUS
500.0000 mL | Freq: Once | INTRAVENOUS | Status: AC
  Administered 2021-11-18: 500 mL via INTRAVENOUS

## 2021-11-18 NOTE — Progress Notes (Signed)
Daily Progress Note   Patient Name: Kurt Foley       Date: 11/18/2021 DOB: 1975/08/31  Age: 46 y.o. MRN#: [id number] Attending Physician: Damita Lack, MD Primary Care Physician: Gildardo Pounds, NP Admit Date: 11/09/2021  Reason for Consultation/Follow-up: Establishing goals of care  Subjective: Patient patient feels restless this morning.  He is sitting up in bed.  He has been spiking temperatures.  Tmax was 103 F yesterday.  Call placed and today I was able to reach his on to [facility], see below.  Length of Stay: 8  Current Medications: Scheduled Meds:   azithromycin  500 mg Oral Daily   B-complex with vitamin C  1 tablet Oral Daily   bictegravir-emtricitabine-tenofovir AF  1 tablet Oral Daily   ethambutol  800 mg Oral Daily   feeding supplement  237 mL Oral TID BM   ketoconazole   Topical BID   metoprolol succinate  100 mg Oral q morning   mirtazapine  15 mg Oral QHS   mometasone-formoterol  2 puff Inhalation BID   pantoprazole  40 mg Oral QAC breakfast   QUEtiapine  200 mg Oral QHS   rivaroxaban  20 mg Oral Q supper   rosuvastatin  5 mg Oral q morning   sodium chloride flush  3 mL Intravenous Q12H   umeclidinium bromide  1 puff Inhalation Daily    Continuous Infusions:  sodium chloride 75 mL/hr at 11/18/21 1137   lactated ringers 10 mL/hr at 11/17/21 0618    PRN Meds: acetaminophen (TYLENOL) oral liquid 160 mg/5 mL, acetaminophen **OR** acetaminophen, chlorpheniramine-HYDROcodone, guaiFENesin-dextromethorphan, hydrALAZINE, ibuprofen, ipratropium-albuterol, metoprolol tartrate, morphine, morphine injection, ondansetron (ZOFRAN) IV, polyethylene glycol, senna-docusate  Physical Exam         Restless Chronically ill-appearing Normal respiratory effort Has  sinus tachycardia No edema Awake alert  Vital Signs: BP 99/73 (BP Location: Left Arm)   Pulse (!) 108   Temp 98.1 F (36.7 C) (Oral)   Resp 20   Ht _0  (1.676 m)   Wt 69.2 kg   SpO2 99%   BMI 24.62 kg/m  SpO2: SpO2: 99 % O2 Device: O2 Device: Room Air O2 Flow Rate: O2 Flow Rate (L/min): 1 L/min  Intake/output summary:  Intake/Output Summary (Last 24 hours) at 11/18/2021 1435 Last data filed at 11/18/2021 3545  Gross per 24 hour  Intake 393.62 ml  Output 1270 ml  Net -876.38 ml   LBM: Last BM Date :  (UTA; pt does not remember last BM) Baseline Weight: Weight: 67.9 kg Most recent weight: Weight: 69.2 kg       Palliative Assessment/Data:      Patient Active Problem List   Diagnosis Date Noted   Mediastinal lymphadenopathy    Pneumonia 11/10/2021   SIRS (systemic inflammatory response syndrome) ([place]) 11/09/2021   Pulmonary embolism ([place]) 10/29/2021   Tinea corporis and facialis 10/07/2021   Acute respiratory infection    Tinea barbae    Dyspnea 10/06/2021   Mass of upper lobe of right lung 10/06/2021   Cough 09/14/2021   Insomnia 08/08/2021   Pancytopenia ([place]) 08/08/2021   Abnormal LFTs 08/08/2021   Neutropenia ([place]) 08/08/2021   Sinus tachycardia    Encephalitis 07/22/2021   Debility 07/20/2021   Hypokalemia 07/19/2021   Hyponatremia 07/18/2021   Malnutrition of moderate degree 07/15/2021   FTT (failure to thrive) in adult 07/13/2021   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   CVA (cerebral vascular accident) ([place]) 07/08/2021   History of CVA with residual right hemiparesis 05/25/2021   Rash of mouth present on examination 03/25/2021   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[place]) 03/25/2021   Need for prophylactic vaccination and inoculation against smallpox 03/25/2021   Numbness and tingling of right arm 03/24/2021   AIDS (acquired immune deficiency syndrome) ([place]) 03/05/2021   Emphysema, unspecified ([place])  03/05/2021   Cocaine-induced mood disorder ([place])    Tinea pedis 08/06/2019   At risk for opportunistic infections 07/14/2019   AKI (acute kidney injury) ([place]) 06/02/2019   Generalized weakness 06/02/2019   Cocaine abuse ([place]) 11/25/2018   Major depressive disorder, 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/2018   Substance induced mood disorder ([place]) 09/16/2015   Cocaine use disorder, severe, dependence ([place])    Cannabis use disorder, severe, dependence ([place]) 04/09/2015   Severe recurrent major depression without psychotic features ([place]) 04/08/2015   Alcohol abuse 04/08/2015   Screening examination for venereal disease 03/25/2015   Encounter for long-term (current) use of medications 03/25/2015   Tobacco abuse    Visual disturbance 11/18/2013   Human immunodeficiency virus (HIV) disease ([place]) 10/30/2013   Atopic dermatitis 10/30/2013    Palliative Care Assessment & Plan   Patient Profile:    Assessment: 46 year old gentleman with advanced AIDS, disseminated MAC, substance use, depression prior stroke with residual right-sided weakness, history of pulmonary embolism, admitted with cough and palpitations.  CT scan concerning for possible lung mass, underwent bronchoscopy, infectious disease following.  Has right upper lobe mass.  Bronchoscopy brushings negative, consideration was being given to pursuing biopsy however plan is to proceed with outpatient PET scan.  Hospital course complicated by SIRS, patient off-and-on spiking fevers.  Remains on azithromycin. Palliative medicine team following for CODE STATUS and broad goals of care discussions.  Recommendations/Plan: Patient stated that he defers to his family for all decision making.  He has mother, sister and aunt are listed on the demographics section.  Particularly, he asked that I contact his aunt to [place].  Call placed and today was able to reach [facility].  I discussed with her frankly about compassionately about the  patient's current condition and our concerns with regards to the patient's overall condition.  Specifically discussed about CODE STATUS.  We compared and contrasted benefits and disadvantages of full code versus DNR/DNI.  Described to her in detail of all  that entails inside a resuscitation attempt.  Offered my medical recommendation for consideration for DNR/DNI given the patient's acute and chronic underlying conditions.  She wishes to discuss with the other family members however wanted Kurt Foley stated that she understands the serious nature of the patient's condition.  Palliative medicine team to follow over the weekend.  Full code/full scope for now.   Goals of Care and Additional Recommendations: Limitations on Scope of Treatment: Full Scope Treatment  Code Status:    Code Status Orders  (From admission, onward)           Start     Ordered   11/09/21 1838  Full code  Continuous        11/09/21 1840           Code Status History     Date Active Date Inactive Code Status Order ID Comments User Context   10/29/2021 2157 11/02/2021 2052 Full Code [id number]  Orene Desanctis, DO ED   10/06/2021 1134 10/07/2021 2015 Full Code [id number]  Jonnie Finner, DO Inpatient   07/20/2021 1310 08/06/2021 1733 Partial Code [id number]  Bary Leriche, PA-C Inpatient   07/13/2021 2037 07/20/2021 1258 Partial Code [id number]  Florencia Reasons, MD Inpatient   07/13/2021 1928 07/13/2021 2037 Partial Code [id number]  Florencia Reasons, MD Inpatient   06/02/2019 0444 06/04/2019 2009 Full Code [id number]  Edmonia Lynch, DO ED   11/25/2018 1810 11/28/2018 1921 Full Code [id number]  Patrecia Pour, NP Inpatient   11/25/2018 0351 11/25/2018 1738 Full Code [id number]  Margette Fast, MD ED   11/24/2018 2056 11/25/2018 0351 Full Code [id number]  Francine Graven, DO ED   10/09/2015 2143 10/12/2015 1706 Full Code [id number]  Delfin Gant, NP Inpatient   10/09/2015 0457 10/09/2015 2143 Full Code [id number]  Charlann Lange, PA-C ED   09/15/2015 0042  09/16/2015 1830 Full Code [id number]  Ronnie Doss, RN Inpatient   09/14/2015 1705 09/14/2015 2334 Full Code [id number]  Merrily Pew, MD ED   04/07/2015 1833 04/15/2015 2119 Full Code [id number]  Kerrie Buffalo, NP Inpatient   04/06/2015 2106 04/07/2015 1833 Full Code [id number]  Junius Creamer, NP ED   04/06/2015 2105 04/06/2015 2106 Full Code [id number]  Junius Creamer, NP ED   03/20/2015 0434 03/21/2015 1547 Full Code [id number]  Theressa Millard, MD Inpatient       Prognosis:  guarded  Discharge Planning: To Be Determined  Care plan was discussed with patient, additionally call placed and discussed in detail with Kurt Foley as per patient's wishes.   Thank you for allowing the Palliative Medicine Team to assist in the care of this patient.   Time In:  1400 Time Out: 1440 Total Time 40 Prolonged Time Billed No       Greater than 50%  of this time was spent counseling and coordinating care related to the above assessment and plan.  Loistine Chance, MD  Please contact Palliative Medicine Team phone at [phone] for questions and concerns.

## 2021-11-18 NOTE — Progress Notes (Signed)
[facility]   Reason for visit: follow up on HIV, PML, MAI  Interval History: fever yesterday up to 103.  No new symptoms.  Tolerating his medication.      Physical Exam: Constitutional:  Vitals:   11/18/21 0430 11/18/21 0922  BP: 126/89 109/82  Pulse: (!) 115 (!) 143  Resp: (!) 25   Temp: 98.4 F (36.9 C) (!) 101.3 F (38.5 C)  SpO2: 96%   He is chronically ill-appearing Respiratory: normal respiratory effort GI: soft  Review of Systems: Constitutional: + fever, no chills  Lab Results  Component Value Date   WBC 5.7 11/18/2021   HGB 9.6 (L) 11/18/2021   HCT 31.4 (L) 11/18/2021   MCV 92.4 11/18/2021   PLT 349 11/18/2021    Lab Results  Component Value Date   CREATININE 0.87 11/18/2021   BUN 16 11/18/2021   NA 138 11/18/2021   K 4.2 11/18/2021   CL 109 11/18/2021   CO2 19 (L) 11/18/2021    Lab Results  Component Value Date   ALT 74 (H) 11/18/2021   AST 47 (H) 11/18/2021   ALKPHOS 229 (H) 11/18/2021     Microbiology: Recent Results (from the past 240 hour(s))  Urine Culture     Status: Abnormal   Collection Time: 11/09/21  3:01 PM   Specimen: Urine, Clean Catch  Result Value Ref Range Status   Specimen Description   Final    URINE, CLEAN CATCH Performed at [facility], [address], [place], [place] [address]    Special Requests   Final    NONE Performed at [facility], [address], [place], [place] [address]    Culture (A)  Final    <10,000 COLONIES/mL INSIGNIFICANT GROWTH Performed at [facility], [address] [place], [place] [address]    Report Status 11/10/2021 FINAL  Final  SARS Coronavirus 2 by RT PCR (hospital order, performed in [facility] hospital lab) *cepheid single result test* Anterior Nasal Swab     Status: None   Collection Time: 11/09/21  3:01 PM   Specimen: Anterior Nasal Swab  Result Value Ref Range Status   SARS Coronavirus 2 by RT PCR  NEGATIVE NEGATIVE Final    Comment: (NOTE) SARS-CoV-2 target nucleic acids are NOT DETECTED.  The SARS-CoV-2 RNA is generally detectable in upper and lower respiratory specimens during the acute phase of infection. The lowest concentration of SARS-CoV-2 viral copies this assay can detect is 250 copies / mL. A negative result does not preclude SARS-CoV-2 infection and should not be used as the sole basis for treatment or other patient management decisions.  A negative result may occur with improper specimen collection / handling, submission of specimen other than nasopharyngeal swab, presence of viral mutation(s) within the areas targeted by this assay, and inadequate number of viral copies (<250 copies / mL). A negative result must be combined with clinical observations, patient history, and epidemiological information.  Fact Sheet for Patients:   RoadLapTop.co.za  Fact Sheet for Healthcare Providers: http://kim-miller.com/  This test is not yet approved or  cleared by the [place] FDA and has been authorized for detection and/or diagnosis of SARS-CoV-2 by FDA under an Emergency Use Authorization (EUA).  This EUA will remain in effect (meaning this test can be used) for the duration of the COVID-19 declaration under Section 564(b)(1) of the Act, 21 U.S.C. section 360bbb-3(b)(1), unless the authorization is terminated or revoked sooner.  Performed at [facility], [address] [place], [place] [address]   Blood Culture (routine x 2)     Status: None   Collection Time: 11/09/21  3:38 PM   Specimen: BLOOD  Result Value Ref Range Status   Specimen Description   Final    BLOOD RIGHT ANTECUBITAL Performed at [facility], [address] [place], [place] [address]    Special Requests   Final    BOTTLES DRAWN AEROBIC AND ANAEROBIC Blood Culture results may not be optimal due to an inadequate  volume of blood received in culture bottles Performed at [facility], [address] [place], [place] [address]    Culture   Final    NO GROWTH 5 DAYS Performed at [facility], [address] [place], [place] [address]    Report Status 11/14/2021 FINAL  Final  Blood Culture (routine x 2)     Status: None   Collection Time: 11/09/21  5:02 PM   Specimen: BLOOD  Result Value Ref Range Status   Specimen Description   Final    BLOOD SITE NOT SPECIFIED Performed at [facility], [address] [place], [place] [address]    Special Requests   Final    BOTTLES DRAWN AEROBIC AND ANAEROBIC Blood Culture results may not be optimal due to an inadequate volume of blood received in culture bottles Performed at [facility], [address] [place], [place] [address]    Culture   Final    NO GROWTH 5 DAYS Performed at [facility], [address] [place], [place] [address]    Report Status 11/14/2021 FINAL  Final  Acid Fast Smear (AFB)     Status: None   Collection Time: 11/12/21  1:12 AM   Specimen: Vein; Blood  Result Value Ref Range Status   AFB Specimen Processing Concentration  Final    Comment: (NOTE) Performed At: [facility] [address] [place], [place] [id number] Jolene SchimkeNagendra Sanjai MD OZ:[phone]    Acid Fast Smear QNSAFB  Final    Comment: (NOTE) Test not performed. AFB Smear not performed due to specimen source (blood) or insufficient specimen.    Source (AFB) BLD  Final    Comment: Performed at [facility], [address] [place], [place] [address]  Culture, Respiratory w Gram Stain     Status: None   Collection Time: 11/14/21  2:42 PM   Specimen: Bronchial Washing, Right; Respiratory  Result Value Ref Range Status   Specimen Description   Final    BRONCHIAL ALVEOLAR LAVAGE RUL Performed at [facility], [address] [place], [place] [address]     Special Requests   Final    NONE Performed at [facility], [address] [place], [place] [address]    Gram Stain NO WBC SEEN NO ORGANISMS SEEN   Final   Culture   Final    NO GROWTH 2 DAYS Performed at [facility], [address] [place], [place] [address]    Report Status 11/17/2021 FINAL  Final  Anaerobic culture w Gram Stain     Status: None (Preliminary result)   Collection Time: 11/14/21  2:42 PM   Specimen: Bronchial Washing, Right; Respiratory  Result Value Ref Range Status   Specimen Description   Final    BRONCHIAL ALVEOLAR LAVAGE RUL Performed at [facility], [address]  [place], [place] [address]    Special Requests   Final    NONE Performed at [facility], [address] [place], [place] [address]    Culture   Final    NO ANAEROBES ISOLATED; CULTURE IN PROGRESS FOR 5 DAYS   Report Status PENDING  Incomplete  Culture, blood (Routine X 2) w Reflex to ID Panel     Status: None (Preliminary result)   Collection Time: 11/16/21  7:00 PM   Specimen: BLOOD LEFT FOREARM  Result Value Ref Range Status   Specimen Description BLOOD LEFT FOREARM  Final   Special Requests IN PEDIATRIC BOTTLE Blood Culture adequate volume  Final   Culture   Final    NO GROWTH < 24 HOURS Performed at [facility], [address], [place], [place] [address]    Report Status PENDING  Incomplete  Culture, blood (Routine X 2) w Reflex to ID Panel     Status: None (Preliminary result)   Collection Time: 11/16/21  7:00 PM   Specimen: BLOOD LEFT WRIST  Result Value Ref Range Status   Specimen Description   Final    BLOOD LEFT WRIST BLOOD Performed at [facility], [address] [place], [place] [address]    Special Requests IN PEDIATRIC BOTTLE Blood Culture adequate volume  Final   Culture   Final    NO GROWTH < 24 HOURS Performed at [facility], [address] [place], [place]  [address]    Report Status PENDING  Incomplete    Impression/Plan:  1. Lung mass - cytology and other studies unrevealing to date.  IR not comfortable doing a CT-guided biopsy and recommended a PET scan.   Will follow up with a PET and pulmonary for consideration of navigational biopsy.      2.  Fever - multiple possibilities and his ongoing MAI possible.  I will check an AFB blood culture.  He is already on treatment though would consider adding rifabutin   3.  HIV - continues on Biktarvy and no issues.  Suppressed viral load but CD4 remains poor.  He will continue with Biktarvy.    4.  Weakness - he continues to decline while inpatient and may need to consider rehab to get him ready to go back home.  5.  Palliative care - remains full code but overall prognosis poor with all of his underlying complications.  I appreciate their involvement.    Dr. Drue Second is on over the weekend if needed.

## 2021-11-18 NOTE — Progress Notes (Signed)
PROGRESS NOTE    Kurt Foley  LFY:[id number] DOB: Oct 27, 1975 DOA: 11/09/2021 PCP: Gildardo Pounds, NP   Brief Narrative:  46 year old with advanced AIDS, disseminated MAC substance abuse, depression, prior CVA with residual right-sided weakness, pulmonary embolism on Xarelto admitted for cough and palpitations.  Repeat CT showed abnormal lung parenchyma.  He was seen by pulmonary, cultures remain negative.  Underwent bronchoscopy with EBUS on 6/5 with subcarinal lymph node sampling and brushing with BAL of right upper lobe mass.  Brushings remain negative, IR consulted for CT-guided biopsy.  Patient off-and-on spiking fever, discussed with ID and this is likely due to MAC.  Patient has also been seen by palliative care service, wishes to be full code.   Assessment & Plan:  Principal Problem:   SIRS (systemic inflammatory response syndrome) (HCC) Active Problems:   Mass of upper lobe of right lung   Human immunodeficiency virus (HIV) disease (HCC)   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HCC)   Emphysema, unspecified (HCC)   Abnormal LFTs   History of CVA with residual right hemiparesis   Pulmonary embolism (HCC)   Severe recurrent major depression without psychotic features ([place])   Insomnia   Pneumonia   Mediastinal lymphadenopathy     Assessment and Plan: SIRS - Patient is off-and-on spiking fevers, discussed with ID.  This is likely secondary to disseminated MAC history.  Continue current therapy with azithromycin.  Sinus Tachycardia - Combination of intermittent fever, deconditioning and concerns of dehydration.  Will order 400 cc normal saline bolus followed by 75 cc/h.  we will increase his Toprol-XL to 158m daily  Right upper lobe mass -Status post bronchoscopy with EBUS on 6/5.  Brushings remain negative.  Case discussed with IR who recommends outptn PET scan. Spoke with Dr BLamonte Sakaifrom PO[place]et who also reviewed images and recommended outptn PET followed  by follow up with Dr iValeta Harmsfor discussion of possible Navigation Bronch. There may be a component of infection which may clear with time?    AKI -Resolved   Pulmonary embolus Transition back to Xarelto.   History of CVA -Residual right-sided weakness.  On statin  Emphysema -Bronchodilators  HIV with history of MAI. -Infectious diseases following.  currently on Biktarvy.  On azithromycin daily as well.  Substance use -Counseled to quit using.  Depression -On Seroquel and Remeron  GERD -Daily PPI   Transaminitis -Trend LFTs   Goals of care -Advanced HIV with very guarded prognosis.  -Palliative care following-he wishes to be full code      DVT prophylaxis:   Xarelto Code Status: Full code Family Communication:    Status is: Inpatient Remains inpatient appropriate because: Patient is still very deconditioned with frequent tachycardia with heart rate in 130s-140s.  Clinical dehydration, getting IV fluids and bolus today. Nutritional status    Signs/Symptoms: estimated needs  Interventions: Refer to RD note for recommendations, Ensure Enlive (each supplement provides 350kcal and 20 grams of protein)  Body mass index is 24.62 kg/m.      Subjective: Patient seen and examined at bedside this morning, he was febrile with tachycardia with heart rate as high as 170s which started improving after getting Lopressor.  He was also given Tylenol for this.  Examination: Constitutional: Not in acute distress.  Appears chronically ill Respiratory: Clear to auscultation bilaterally Cardiovascular: Sinus tachycardia Abdomen: Nontender nondistended good bowel sounds Musculoskeletal: No edema noted Skin: No rashes seen Neurologic: CN 2-12 grossly intact.  And nonfocal Psychiatric: Normal judgment and insight. Alert and oriented  x 3. Normal mood.  Objective: Vitals:   11/17/21 1940 11/17/21 2100 11/18/21 0136 11/18/21 0430  BP:  98/88  126/89  Pulse:  95  (!) 115   Resp:  (!) 26  (!) 25  Temp:  97.7 F (36.5 C) (!) 97.4 F (36.3 C) 98.4 F (36.9 C)  TempSrc:  Oral Oral Oral  SpO2: 96% 97%  96%  Weight:    69.2 kg  Height:        Intake/Output Summary (Last 24 hours) at 11/18/2021 0806 Last data filed at 11/18/2021 0705 Gross per 24 hour  Intake 393.62 ml  Output 1220 ml  Net -826.38 ml   Filed Weights   11/09/21 2040 11/18/21 0430  Weight: 67.9 kg 69.2 kg     Data Reviewed:   CBC: Recent Labs  Lab 11/14/21 0434 11/15/21 0211 11/16/21 0429 11/17/21 0451 11/18/21 0506  WBC 5.1 5.7 3.7* 6.8 5.7  HGB 9.7* 9.3* 8.6* 9.0* 9.6*  HCT 31.1* 29.5* 28.2* 30.2* 31.4*  MCV 89.6 88.9 91.3 95.0 92.4  PLT 413* 391 389 338 128   Basic Metabolic Panel: Recent Labs  Lab 11/12/21 0112 11/14/21 0434 11/15/21 0211 11/17/21 0451 11/18/21 0506  NA  --  138 138 138 138  K  --  4.4 4.4 3.8 4.2  CL  --  109 108 109 109  CO2  --  18* 19* 18* 19*  GLUCOSE  --  98 163* 112* 100*  BUN  --  13 18 21* 16  CREATININE  --  0.83 0.81 0.88 0.87  CALCIUM  --  9.7 9.8 9.2 9.6  MG 2.0  --   --  1.7 1.9  PHOS 4.7*  --   --   --   --    GFR: Estimated Creatinine Clearance: 94.7 mL/min (by C-G formula based on SCr of 0.87 mg/dL). Liver Function Tests: Recent Labs  Lab 11/14/21 0434 11/15/21 0211 11/17/21 0451 11/18/21 0506  AST 60* 64* 79* 47*  ALT 72* 79* 93* 74*  ALKPHOS 270* 274* 214* 229*  BILITOT 0.3 0.6 0.6 0.7  PROT 8.1 7.9 7.2 7.9  ALBUMIN 3.1* 3.0* 2.9* 3.2*   No results for input(s): "LIPASE", "AMYLASE" in the last 168 hours. No results for input(s): "AMMONIA" in the last 168 hours. Coagulation Profile: No results for input(s): "INR", "PROTIME" in the last 168 hours.  Cardiac Enzymes: No results for input(s): "CKTOTAL", "CKMB", "CKMBINDEX", "TROPONINI" in the last 168 hours. BNP (last 3 results) No results for input(s): "PROBNP" in the last 8760 hours. HbA1C: No results for input(s): "HGBA1C" in the last 72 hours. CBG: Recent  Labs  Lab 11/16/21 2046  GLUCAP 87   Lipid Profile: No results for input(s): "CHOL", "HDL", "LDLCALC", "TRIG", "CHOLHDL", "LDLDIRECT" in the last 72 hours. Thyroid Function Tests: No results for input(s): "TSH", "T4TOTAL", "FREET4", "T3FREE", "THYROIDAB" in the last 72 hours. Anemia Panel: No results for input(s): "VITAMINB12", "FOLATE", "FERRITIN", "TIBC", "IRON", "RETICCTPCT" in the last 72 hours. Sepsis Labs: Recent Labs  Lab 11/16/21 1900  PROCALCITON 1.05    Recent Results (from the past 240 hour(s))  Urine Culture     Status: Abnormal   Collection Time: 11/09/21  3:01 PM   Specimen: Urine, Clean Catch  Result Value Ref Range Status   Specimen Description   Final    URINE, CLEAN CATCH Performed at [facility], [place] [address], [place], [place] [address]    Special Requests   Final  NONE Performed at [facility], [place] [address], [place], [place] [address]    Culture (A)  Final    <10,000 COLONIES/mL INSIGNIFICANT GROWTH Performed at [place] [address], [place], [place] [address]    Report Status 11/10/2021 FINAL  Final  SARS Coronavirus 2 by RT PCR (hospital order, performed in [facility] hospital lab) *cepheid single result test* Anterior Nasal Swab     Status: None   Collection Time: 11/09/21  3:01 PM   Specimen: Anterior Nasal Swab  Result Value Ref Range Status   SARS Coronavirus 2 by RT PCR NEGATIVE NEGATIVE Final    Comment: (NOTE) SARS-CoV-2 target nucleic acids are NOT DETECTED.  The SARS-CoV-2 RNA is generally detectable in upper and lower respiratory specimens during the acute phase of infection. The lowest concentration of SARS-CoV-2 viral copies this assay can detect is 250 copies / mL. A negative result does not preclude SARS-CoV-2 infection and should not be used as the sole basis for treatment or other patient management decisions.  A negative result may occur with improper specimen  collection / handling, submission of specimen other than nasopharyngeal swab, presence of viral mutation(s) within the areas targeted by this assay, and inadequate number of viral copies (<250 copies / mL). A negative result must be combined with clinical observations, patient history, and epidemiological information.  Fact Sheet for Patients:   https://www.patel.info/  Fact Sheet for Healthcare Providers: https://hall.com/  This test is not yet approved or  cleared by the [place] FDA and has been authorized for detection and/or diagnosis of SARS-CoV-2 by FDA under an Emergency Use Authorization (EUA).  This EUA will remain in effect (meaning this test can be used) for the duration of the COVID-19 declaration under Section 564(b)(1) of the Act, 21 U.S.C. section 360bbb-3(b)(1), unless the authorization is terminated or revoked sooner.  Performed at [facility], [place] [address], [place], [place] [address]   Blood Culture (routine x 2)     Status: None   Collection Time: 11/09/21  3:38 PM   Specimen: BLOOD  Result Value Ref Range Status   Specimen Description   Final    BLOOD RIGHT ANTECUBITAL Performed at [place] [address], [place], [place] [address]    Special Requests   Final    BOTTLES DRAWN AEROBIC AND ANAEROBIC Blood Culture results may not be optimal due to an inadequate volume of blood received in culture bottles Performed at [place] [address] [place], [place] [address]    Culture   Final    NO GROWTH 5 DAYS Performed at [facility], [place] [address] [place], [place] [address]    Report Status 11/14/2021 FINAL  Final  Blood Culture (routine x 2)     Status: None   Collection Time: 11/09/21  5:02 PM   Specimen: BLOOD  Result Value Ref Range Status   Specimen Description   Final    BLOOD SITE NOT SPECIFIED Performed at [place] [address], [place], [place] [address]    Special Requests   Final    BOTTLES DRAWN AEROBIC AND ANAEROBIC Blood Culture results may not be optimal due to an inadequate volume of blood received in culture bottles Performed at [place] [address], [place], [place] [address]    Culture   Final    NO GROWTH 5 DAYS Performed at [facility],  [address] [place], [place] [address]    Report Status 11/14/2021 FINAL  Final  Acid Fast Smear (AFB)     Status: None   Collection Time: 11/12/21  1:12 AM   Specimen: Vein; Blood  Result Value Ref Range Status   AFB Specimen Processing Concentration  Final    Comment: (NOTE) Performed At: [facility] [place], [place] [id number] Rush Farmer MD [id number]    Acid Fast Smear QNSAFB  Final    Comment: (NOTE) Test not performed. AFB Smear not performed due to specimen source (blood) or insufficient specimen.    Source (AFB) BLD  Final    Comment: Performed at [facility], [place] [address] [place], [place] [address]  Culture, Respiratory w Gram Stain     Status: None   Collection Time: 11/14/21  2:42 PM   Specimen: Bronchial Washing, Right; Respiratory  Result Value Ref Range Status   Specimen Description   Final    BRONCHIAL ALVEOLAR LAVAGE RUL Performed at [place] [address], [place], [place] [address]    Special Requests   Final    NONE Performed at [facility], [place] [address], [place], [place] [address]    Gram Stain NO WBC SEEN NO ORGANISMS SEEN   Final   Culture   Final    NO GROWTH 2 DAYS Performed at [facility], [place] [address], [place], [place] [address]    Report Status 11/17/2021 FINAL  Final  Anaerobic culture w Gram Stain     Status: None (Preliminary result)   Collection Time: 11/14/21  2:42 PM   Specimen: Bronchial Washing, Right; Respiratory  Result Value  Ref Range Status   Specimen Description   Final    BRONCHIAL ALVEOLAR LAVAGE RUL Performed at [place] [address] [place], [place] [address]    Special Requests   Final    NONE Performed at [facility], [place] [address], [place], [place] [address]    Culture   Final    NO ANAEROBES ISOLATED; CULTURE IN PROGRESS FOR 5 DAYS   Report Status PENDING  Incomplete  Culture, blood (Routine X 2) w Reflex to ID Panel     Status: None (Preliminary result)   Collection Time: 11/16/21  7:00 PM   Specimen: BLOOD LEFT FOREARM  Result Value Ref Range Status   Specimen Description BLOOD LEFT FOREARM  Final   Special Requests IN PEDIATRIC BOTTLE Blood Culture adequate volume  Final   Culture   Final    NO GROWTH < 24 HOURS Performed at [facility], [place] [address] [place], [place] [address]    Report Status PENDING  Incomplete  Culture, blood (Routine X 2) w Reflex to ID Panel     Status: None (Preliminary result)   Collection Time: 11/16/21  7:00 PM   Specimen: BLOOD LEFT WRIST  Result Value Ref Range Status   Specimen Description   Final    BLOOD LEFT WRIST BLOOD Performed at [place] [address] [place], [place] [address]    Special Requests IN PEDIATRIC BOTTLE Blood Culture adequate volume  Final   Culture   Final    NO GROWTH < 24 HOURS Performed at [facility], [place] [address] [place], [place] [address]    Report Status PENDING  Incomplete         Radiology Studies: No results found.  Scheduled Meds:  azithromycin  500 mg Oral Daily   B-complex with vitamin C  1 tablet Oral Daily   bictegravir-emtricitabine-tenofovir AF  1 tablet Oral Daily   ethambutol  800 mg Oral Daily   feeding supplement  237 mL Oral TID BM   ketoconazole   Topical BID   metoprolol succinate  50 mg Oral q morning   mirtazapine  15 mg Oral QHS   mometasone-formoterol  2 puff Inhalation BID   pantoprazole   40 mg Oral QAC breakfast   QUEtiapine  200 mg Oral QHS   rivaroxaban  20 mg Oral Q supper   rosuvastatin  5 mg Oral q morning   sodium chloride flush  3 mL Intravenous Q12H   umeclidinium bromide  1 puff Inhalation Daily   Continuous Infusions:  sodium chloride     lactated ringers 10 mL/hr at 11/17/21 0618   sodium chloride       LOS: 8 days   Time spent= 35 mins    Alenna Russell Arsenio Loader, MD [facility]  If 7PM-7AM, please contact night-coverage  11/18/2021, 8:06 AM

## 2021-11-19 DIAGNOSIS — B20 Human immunodeficiency virus [HIV] disease: Secondary | ICD-10-CM | POA: Diagnosis not present

## 2021-11-19 DIAGNOSIS — R6510 Systemic inflammatory response syndrome (SIRS) of non-infectious origin without acute organ dysfunction: Secondary | ICD-10-CM | POA: Diagnosis not present

## 2021-11-19 DIAGNOSIS — I2699 Other pulmonary embolism without acute cor pulmonale: Secondary | ICD-10-CM | POA: Diagnosis not present

## 2021-11-19 DIAGNOSIS — J439 Emphysema, unspecified: Secondary | ICD-10-CM | POA: Diagnosis not present

## 2021-11-19 DIAGNOSIS — R918 Other nonspecific abnormal finding of lung field: Secondary | ICD-10-CM | POA: Diagnosis not present

## 2021-11-19 LAB — COMPREHENSIVE METABOLIC PANEL
ALT: 57 U/L — ABNORMAL HIGH (ref 0–44)
AST: 41 U/L (ref 15–41)
Albumin: 2.9 g/dL — ABNORMAL LOW (ref 3.5–5.0)
Alkaline Phosphatase: 238 U/L — ABNORMAL HIGH (ref 38–126)
Anion gap: 11 (ref 5–15)
BUN: 10 mg/dL (ref 6–20)
CO2: 19 mmol/L — ABNORMAL LOW (ref 22–32)
Calcium: 9.9 mg/dL (ref 8.9–10.3)
Chloride: 107 mmol/L (ref 98–111)
Creatinine, Ser: 0.85 mg/dL (ref 0.61–1.24)
GFR, Estimated: >60 mL/min (ref >60)
Glucose, Bld: 104 mg/dL — ABNORMAL HIGH (ref 70–99)
Potassium: 4.3 mmol/L (ref 3.5–5.1)
Sodium: 137 mmol/L (ref 135–145)
Total Bilirubin: 0.6 mg/dL (ref 0.3–1.2)
Total Protein: 7.9 g/dL (ref 6.5–8.1)

## 2021-11-19 LAB — CBC
HCT: 29.2 % — ABNORMAL LOW (ref 39.0–52.0)
Hemoglobin: 9 g/dL — ABNORMAL LOW (ref 13.0–17.0)
MCH: 27.6 pg (ref 26.0–34.0)
MCHC: 30.8 g/dL (ref 30.0–36.0)
MCV: 89.6 fL (ref 80.0–100.0)
Platelets: 328 10*3/uL (ref 150–400)
RBC: 3.26 MIL/uL — ABNORMAL LOW (ref 4.22–5.81)
RDW: 16.5 % — ABNORMAL HIGH (ref 11.5–15.5)
WBC: 3.8 10*3/uL — ABNORMAL LOW (ref 4.0–10.5)
nRBC: 0 % (ref 0.0–0.2)

## 2021-11-19 LAB — MAGNESIUM: Magnesium: 1.7 mg/dL (ref 1.7–2.4)

## 2021-11-19 MED ORDER — SODIUM CHLORIDE 0.9 % IV BOLUS
500.0000 mL | Freq: Once | INTRAVENOUS | Status: AC
  Administered 2021-11-19: 500 mL via INTRAVENOUS

## 2021-11-19 MED ORDER — SODIUM CHLORIDE 0.9 % IV SOLN: INTRAVENOUS | Status: AC

## 2021-11-19 NOTE — Progress Notes (Signed)
PROGRESS NOTE    Kurt Foley  OAC:[id number] DOB: [birth year] DOA: 11/09/2021 PCP: Gildardo Pounds, NP   Brief Narrative:  46 year old with advanced AIDS, disseminated MAC substance abuse, depression, prior CVA with residual right-sided weakness, pulmonary embolism on Xarelto admitted for cough and palpitations.  Repeat CT showed abnormal lung parenchyma.  He was seen by pulmonary, cultures remain negative.  Underwent bronchoscopy with EBUS on 6/5 with subcarinal lymph node sampling and brushing with BAL of right upper lobe mass.  Brushings remain negative, IR consulted for CT-guided biopsy.  Patient off-and-on spiking fever, discussed with ID and this is likely due to MAC.  Patient has also been seen by palliative care service, wishes to be full code.   Assessment & Plan:  Principal Problem:   SIRS (systemic inflammatory response syndrome) (HCC) Active Problems:   Mass of upper lobe of right lung   Human immunodeficiency virus (HIV) disease (HCC)   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HCC)   Emphysema, unspecified (HCC)   Abnormal LFTs   History of CVA with residual right hemiparesis   Pulmonary embolism (HCC)   Severe recurrent major depression without psychotic features ([place])   Insomnia   Pneumonia   Mediastinal lymphadenopathy   Goals of care, counseling/discussion   General weakness   Encounter for palliative care     Assessment and Plan: SIRS - Patient is off-and-on spiking fevers, discussed with ID.  This is likely secondary to disseminated MAC history.  Continue current therapy with azithromycin.  Sinus Tachycardia - Combination of intermittent fever, deconditioning and concerns of dehydration.  Will give additional 500 cc normal saline bolus followed by 75 cc/h.  Continue Toprol-XL 100 mg daily.  Right upper lobe mass -Status post bronchoscopy with EBUS on 6/5.  Brushings remain negative.  Case discussed with IR who recommends outptn PET scan.  Spoke with Dr Lamonte Sakai from [place], who also reviewed images and recommended outptn PET followed by follow up with Dr Valeta Harms for discussion of possible Navigation Bronch. There may be a component of infection which may clear with time?    AKI -Resolved   Pulmonary embolus Transition back to Xarelto.   History of CVA -Residual right-sided weakness.  On statin  Emphysema -Bronchodilators  HIV with history of MAI. -Infectious diseases following.  currently on Biktarvy.  On azithromycin daily as well.  Substance use -Counseled to quit using.  Depression -On Seroquel and Remeron  GERD -Daily PPI   Transaminitis -Trend LFTs   Goals of care -Advanced HIV with very guarded prognosis.  -Palliative care following-he wishes to be full code      DVT prophylaxis:   Xarelto Code Status: Full code Family Communication:    Status is: Inpatient Remains inpatient appropriate because: Patient is still severely deconditioned.  Frequently spikes fever as high as 103.  Gets tachycardic into the 140s.  Signs/Symptoms: estimated needs  Interventions: Refer to RD note for recommendations, Ensure Enlive (each supplement provides 350kcal and 20 grams of protein)  Body mass index is 24.62 kg/m.      Subjective: Feels ok, no new complaints.   Examination: Constitutional: Not in acute distress. Chronically ill Respiratory: Clear to auscultation bilaterally Cardiovascular: Sinus tachycardia Abdomen: Nontender nondistended good bowel sounds Musculoskeletal: No edema noted Skin: No rashes seen Neurologic: CN 2-12 grossly intact.  And nonfocal Psychiatric: Normal judgment and insight. Alert and oriented x 3. Normal mood.     Objective: Vitals:   11/18/21 1320 11/18/21 1641 11/18/21 2016 11/19/21 0514  BP: 99/73  111/81 122/86  Pulse: (!) 108  (!) 106 (!) 103  Resp: _0 Temp: 98.1 F (36.7 C)  99.1 F (37.3 C) 98.6 F (37 C)  TempSrc: Oral  Oral Oral  SpO2: 99% 99% 98%  100%  Weight:      Height:        Intake/Output Summary (Last 24 hours) at 11/19/2021 0803 Last data filed at 11/19/2021 0758 Gross per 24 hour  Intake 1696.59 ml  Output 1480 ml  Net 216.59 ml   Filed Weights   11/09/21 2040 11/18/21 0430  Weight: 67.9 kg 69.2 kg     Data Reviewed:   CBC: Recent Labs  Lab 11/14/21 0434 11/15/21 0211 11/16/21 0429 11/17/21 0451 11/18/21 0506  WBC 5.1 5.7 3.7* 6.8 5.7  HGB 9.7* 9.3* 8.6* 9.0* 9.6*  HCT 31.1* 29.5* 28.2* 30.2* 31.4*  MCV 89.6 88.9 91.3 95.0 92.4  PLT 413* 391 389 338 607   Basic Metabolic Panel: Recent Labs  Lab 11/14/21 0434 11/15/21 0211 11/17/21 0451 11/18/21 0506  NA 138 138 138 138  K 4.4 4.4 3.8 4.2  CL 109 108 109 109  CO2 18* 19* 18* 19*  GLUCOSE 98 163* 112* 100*  BUN 13 18 21* 16  CREATININE 0.83 0.81 0.88 0.87  CALCIUM 9.7 9.8 9.2 9.6  MG  --   --  1.7 1.9   GFR: Estimated Creatinine Clearance: 94.7 mL/min (by C-G formula based on SCr of 0.87 mg/dL). Liver Function Tests: Recent Labs  Lab 11/14/21 0434 11/15/21 0211 11/17/21 0451 11/18/21 0506  AST 60* 64* 79* 47*  ALT 72* 79* 93* 74*  ALKPHOS 270* 274* 214* 229*  BILITOT 0.3 0.6 0.6 0.7  PROT 8.1 7.9 7.2 7.9  ALBUMIN 3.1* 3.0* 2.9* 3.2*   No results for input(s): "LIPASE", "AMYLASE" in the last 168 hours. No results for input(s): "AMMONIA" in the last 168 hours. Coagulation Profile: No results for input(s): "INR", "PROTIME" in the last 168 hours.  Cardiac Enzymes: No results for input(s): "CKTOTAL", "CKMB", "CKMBINDEX", "TROPONINI" in the last 168 hours. BNP (last 3 results) No results for input(s): "PROBNP" in the last 8760 hours. HbA1C: No results for input(s): "HGBA1C" in the last 72 hours. CBG: Recent Labs  Lab 11/16/21 2046  GLUCAP 87   Lipid Profile: No results for input(s): "CHOL", "HDL", "LDLCALC", "TRIG", "CHOLHDL", "LDLDIRECT" in the last 72 hours. Thyroid Function Tests: No results for input(s): "TSH",  "T4TOTAL", "FREET4", "T3FREE", "THYROIDAB" in the last 72 hours. Anemia Panel: No results for input(s): "VITAMINB12", "FOLATE", "FERRITIN", "TIBC", "IRON", "RETICCTPCT" in the last 72 hours. Sepsis Labs: Recent Labs  Lab 11/16/21 1900  PROCALCITON 1.05    Recent Results (from the past 240 hour(s))  Urine Culture     Status: Abnormal   Collection Time: 11/09/21  3:01 PM   Specimen: Urine, Clean Catch  Result Value Ref Range Status   Specimen Description   Final    URINE, CLEAN CATCH Performed at [facility], [place] [address] [place], [place] [address]    Special Requests   Final    NONE Performed at [facility], [place] [address], [place], [place] [address]    Culture (A)  Final    <10,000 COLONIES/mL INSIGNIFICANT GROWTH Performed at [place] [address], [place], [place] [address]    Report Status 11/10/2021 FINAL  Final  SARS Coronavirus 2 by RT PCR (hospital order, performed in [facility] hospital lab) *  cepheid single result test* Anterior Nasal Swab     Status: None   Collection Time: 11/09/21  3:01 PM   Specimen: Anterior Nasal Swab  Result Value Ref Range Status   SARS Coronavirus 2 by RT PCR NEGATIVE NEGATIVE Final    Comment: (NOTE) SARS-CoV-2 target nucleic acids are NOT DETECTED.  The SARS-CoV-2 RNA is generally detectable in upper and lower respiratory specimens during the acute phase of infection. The lowest concentration of SARS-CoV-2 viral copies this assay can detect is 250 copies / mL. A negative result does not preclude SARS-CoV-2 infection and should not be used as the sole basis for treatment or other patient management decisions.  A negative result may occur with improper specimen collection / handling, submission of specimen other than nasopharyngeal swab, presence of viral mutation(s) within the areas targeted by this assay, and inadequate number of viral copies (<250 copies / mL). A negative result  must be combined with clinical observations, patient history, and epidemiological information.  Fact Sheet for Patients:   https://www.patel.info/  Fact Sheet for Healthcare Providers: https://hall.com/  This test is not yet approved or  cleared by the [place] FDA and has been authorized for detection and/or diagnosis of SARS-CoV-2 by FDA under an Emergency Use Authorization (EUA).  This EUA will remain in effect (meaning this test can be used) for the duration of the COVID-19 declaration under Section 564(b)(1) of the Act, 21 U.S.C. section 360bbb-3(b)(1), unless the authorization is terminated or revoked sooner.  Performed at [facility], [place] [address], [place], [place] [address]   Blood Culture (routine x 2)     Status: None   Collection Time: 11/09/21  3:38 PM   Specimen: BLOOD  Result Value Ref Range Status   Specimen Description   Final    BLOOD RIGHT ANTECUBITAL Performed at [place] [address], [place], [place] [address]    Special Requests   Final    BOTTLES DRAWN AEROBIC AND ANAEROBIC Blood Culture results may not be optimal due to an inadequate volume of blood received in culture bottles Performed at [place] [address] [place], [place] [address]    Culture   Final    NO GROWTH 5 DAYS Performed at [facility], [place] [address], [place], [place] [address]    Report Status 11/14/2021 FINAL  Final  Blood Culture (routine x 2)     Status: None   Collection Time: 11/09/21  5:02 PM   Specimen: BLOOD  Result Value Ref Range Status   Specimen Description   Final    BLOOD SITE NOT SPECIFIED Performed at [place] [name], [place], [place] [address]    Special Requests   Final    BOTTLES DRAWN AEROBIC AND ANAEROBIC Blood Culture results may not be optimal due to an inadequate volume of blood received in culture  bottles Performed at [place] [address] [place], [place] [address]    Culture   Final    NO GROWTH 5 DAYS Performed at [facility], [place] [address], [place], [place] [address]    Report Status 11/14/2021 FINAL  Final  Acid Fast Smear (AFB)     Status: None   Collection Time: 11/12/21  1:12 AM   Specimen: Vein; Blood  Result Value Ref Range Status   AFB Specimen Processing Concentration  Final    Comment: (NOTE) Performed At: [facility] [address]  [place], [place] [id number] Rush Farmer MD HM:[id number]    Acid Fast Smear QNSAFB  Final    Comment: (NOTE) Test not performed. AFB Smear not performed due to specimen source (blood) or insufficient specimen.    Source (AFB) BLD  Final    Comment: Performed at [facility], [place] [address], [place], [place] [address]  Culture, Respiratory w Gram Stain     Status: None   Collection Time: 11/14/21  2:42 PM   Specimen: Bronchial Washing, Right; Respiratory  Result Value Ref Range Status   Specimen Description   Final    BRONCHIAL ALVEOLAR LAVAGE RUL Performed at [place] [address], [place], [place] [address]    Special Requests   Final    NONE Performed at [facility], [place] [address] [place], [place] [address]    Gram Stain NO WBC SEEN NO ORGANISMS SEEN   Final   Culture   Final    NO GROWTH 2 DAYS Performed at [facility], [place] [address], [place], [place] [address]    Report Status 11/17/2021 FINAL  Final  Anaerobic culture w Gram Stain     Status: None (Preliminary result)   Collection Time: 11/14/21  2:42 PM   Specimen: Bronchial Washing, Right; Respiratory  Result Value Ref Range Status   Specimen Description   Final    BRONCHIAL ALVEOLAR LAVAGE RUL Performed at [place] [address], [place], [place] [address]    Special Requests   Final    NONE Performed at [facility], [place] [address], [place], [place] [address]    Culture   Final    NO ANAEROBES ISOLATED; CULTURE IN PROGRESS FOR 5 DAYS   Report Status PENDING  Incomplete  Culture, blood (Routine X 2) w Reflex to ID Panel     Status: None (Preliminary result)   Collection Time: 11/16/21  7:00 PM   Specimen: BLOOD LEFT FOREARM  Result Value Ref Range Status   Specimen Description BLOOD LEFT FOREARM  Final   Special Requests IN PEDIATRIC BOTTLE Blood Culture adequate volume  Final   Culture   Final    NO GROWTH 2 DAYS Performed at [facility], [place] [address], [place], [place] [address]    Report Status PENDING  Incomplete  Culture, blood (Routine X 2) w Reflex to ID Panel     Status: None (Preliminary result)   Collection Time: 11/16/21  7:00 PM   Specimen: BLOOD LEFT WRIST  Result Value Ref Range Status   Specimen Description   Final    BLOOD LEFT WRIST BLOOD Performed at [place] [address], [place], [place] [address]    Special Requests IN PEDIATRIC BOTTLE Blood Culture adequate volume  Final   Culture   Final    NO GROWTH 2 DAYS Performed at [place] [address] [place], [place] [address]    Report Status PENDING  Incomplete         Radiology Studies: No results found.      Scheduled Meds:  azithromycin  500 mg Oral Daily   B-complex with vitamin C  1 tablet Oral Daily   bictegravir-emtricitabine-tenofovir AF  1 tablet Oral Daily   ethambutol  800 mg Oral Daily   feeding supplement  237 mL Oral TID BM   ketoconazole   Topical BID   metoprolol succinate  100 mg Oral  q morning   mirtazapine  15 mg Oral QHS   mometasone-formoterol  2 puff Inhalation BID   pantoprazole  40 mg Oral QAC breakfast   QUEtiapine  200 mg Oral QHS   rivaroxaban  20 mg Oral Q supper   rosuvastatin  5 mg Oral q morning   sodium chloride flush  3 mL Intravenous Q12H   umeclidinium bromide  1 puff Inhalation Daily   Continuous  Infusions:  sodium chloride     lactated ringers 10 mL/hr at 11/17/21 0618   sodium chloride       LOS: 9 days   Time spent= 35 mins    Michael Walrath Arsenio Loader, MD [facility]  If 7PM-7AM, please contact night-coverage  11/19/2021, 8:03 AM

## 2021-11-20 DIAGNOSIS — B20 Human immunodeficiency virus [HIV] disease: Secondary | ICD-10-CM | POA: Diagnosis not present

## 2021-11-20 DIAGNOSIS — I2699 Other pulmonary embolism without acute cor pulmonale: Secondary | ICD-10-CM | POA: Diagnosis not present

## 2021-11-20 DIAGNOSIS — J069 Acute upper respiratory infection, unspecified: Secondary | ICD-10-CM | POA: Diagnosis not present

## 2021-11-20 DIAGNOSIS — J439 Emphysema, unspecified: Secondary | ICD-10-CM | POA: Diagnosis not present

## 2021-11-20 DIAGNOSIS — R918 Other nonspecific abnormal finding of lung field: Secondary | ICD-10-CM | POA: Diagnosis not present

## 2021-11-20 DIAGNOSIS — J189 Pneumonia, unspecified organism: Secondary | ICD-10-CM | POA: Diagnosis not present

## 2021-11-20 DIAGNOSIS — Z515 Encounter for palliative care: Secondary | ICD-10-CM | POA: Diagnosis not present

## 2021-11-20 DIAGNOSIS — R6510 Systemic inflammatory response syndrome (SIRS) of non-infectious origin without acute organ dysfunction: Secondary | ICD-10-CM | POA: Diagnosis not present

## 2021-11-20 LAB — MAGNESIUM: Magnesium: 1.8 mg/dL (ref 1.7–2.4)

## 2021-11-20 LAB — COMPREHENSIVE METABOLIC PANEL
ALT: 53 U/L — ABNORMAL HIGH (ref 0–44)
AST: 47 U/L — ABNORMAL HIGH (ref 15–41)
Albumin: 2.7 g/dL — ABNORMAL LOW (ref 3.5–5.0)
Alkaline Phosphatase: 218 U/L — ABNORMAL HIGH (ref 38–126)
Anion gap: 10 (ref 5–15)
BUN: 12 mg/dL (ref 6–20)
CO2: 19 mmol/L — ABNORMAL LOW (ref 22–32)
Calcium: 9.1 mg/dL (ref 8.9–10.3)
Chloride: 109 mmol/L (ref 98–111)
Creatinine, Ser: 0.71 mg/dL (ref 0.61–1.24)
GFR, Estimated: >60 mL/min (ref >60)
Glucose, Bld: 109 mg/dL — ABNORMAL HIGH (ref 70–99)
Potassium: 3.9 mmol/L (ref 3.5–5.1)
Sodium: 138 mmol/L (ref 135–145)
Total Bilirubin: 0.5 mg/dL (ref 0.3–1.2)
Total Protein: 7.5 g/dL (ref 6.5–8.1)

## 2021-11-20 LAB — ANAEROBIC CULTURE W GRAM STAIN

## 2021-11-20 LAB — CBC
HCT: 27.8 % — ABNORMAL LOW (ref 39.0–52.0)
Hemoglobin: 8.3 g/dL — ABNORMAL LOW (ref 13.0–17.0)
MCH: 27.6 pg (ref 26.0–34.0)
MCHC: 29.9 g/dL — ABNORMAL LOW (ref 30.0–36.0)
MCV: 92.4 fL (ref 80.0–100.0)
Platelets: 345 10*3/uL (ref 150–400)
RBC: 3.01 MIL/uL — ABNORMAL LOW (ref 4.22–5.81)
RDW: 16.6 % — ABNORMAL HIGH (ref 11.5–15.5)
WBC: 4 10*3/uL (ref 4.0–10.5)
nRBC: 0 % (ref 0.0–0.2)

## 2021-11-20 LAB — ACID FAST SMEAR (AFB, MYCOBACTERIA): Acid Fast Smear: NEGATIVE

## 2021-11-20 MED ORDER — SULFAMETHOXAZOLE-TRIMETHOPRIM 800-160 MG PO TABS
1.0000 | ORAL_TABLET | Freq: Every day | ORAL | Status: DC
  Administered 2021-11-20 – 2021-12-07 (×18): 1 via ORAL
  Filled 2021-11-20 (×19): qty 1

## 2021-11-20 MED ORDER — RIFABUTIN 150 MG PO CAPS
300.0000 mg | ORAL_CAPSULE | Freq: Every day | ORAL | Status: DC
  Administered 2021-11-20 – 2021-12-07 (×18): 300 mg via ORAL
  Filled 2021-11-20 (×18): qty 2

## 2021-11-20 MED ORDER — SODIUM CHLORIDE 0.9 % IV SOLN: INTRAVENOUS | Status: AC

## 2021-11-20 NOTE — Progress Notes (Signed)
Daily Progress Note   Patient Name: Kurt Foley       Date: 11/20/2021 DOB: 1975-07-13  Age: 46 y.o. MRN#: [id number] Attending Physician: Damita Lack, MD Primary Care Physician: Gildardo Pounds, NP Admit Date: 11/09/2021  Reason for Consultation/Follow-up: Establishing goals of care  Subjective: Patient still having fevers, chart reviewed, patient seen, call placed and discussed with [name], see below.  Length of Stay: 10  Current Medications: Scheduled Meds:   azithromycin  500 mg Oral Daily   B-complex with vitamin C  1 tablet Oral Daily   bictegravir-emtricitabine-tenofovir AF  1 tablet Oral Daily   ethambutol  800 mg Oral Daily   feeding supplement  237 mL Oral TID BM   ketoconazole   Topical BID   metoprolol succinate  100 mg Oral q morning   mirtazapine  15 mg Oral QHS   mometasone-formoterol  2 puff Inhalation BID   pantoprazole  40 mg Oral QAC breakfast   QUEtiapine  200 mg Oral QHS   rifabutin  300 mg Oral Daily   rivaroxaban  20 mg Oral Q supper   rosuvastatin  5 mg Oral q morning   sodium chloride flush  3 mL Intravenous Q12H   sulfamethoxazole-trimethoprim  1 tablet Oral Daily   umeclidinium bromide  1 puff Inhalation Daily    Continuous Infusions:  sodium chloride 75 mL/hr at 11/20/21 1154   lactated ringers Stopped (11/20/21 0759)    PRN Meds: acetaminophen (TYLENOL) oral liquid 160 mg/5 mL, acetaminophen **OR** acetaminophen, chlorpheniramine-HYDROcodone, guaiFENesin-dextromethorphan, hydrALAZINE, ibuprofen, ipratropium-albuterol, metoprolol tartrate, morphine, morphine injection, ondansetron (ZOFRAN) IV, polyethylene glycol, senna-docusate  Physical Exam         Chronically ill-appearing Normal respiratory effort Has sinus  tachycardia No edema Awake alert  Vital Signs: BP 105/73 (BP Location: Left Arm)   Pulse (!) 118   Temp (!) 101.3 F (38.5 C) (Oral)   Resp 20   Ht _0  (1.676 m)   Wt 69.2 kg   SpO2 96%   BMI 24.62 kg/m  SpO2: SpO2: 96 % O2 Device: O2 Device: Room Air O2 Flow Rate: O2 Flow Rate (L/min): 1 L/min  Intake/output summary:  Intake/Output Summary (Last 24 hours) at 11/20/2021 1424 Last data filed at 11/20/2021 0900 Gross per 24 hour  Intake 2348.93 ml  Output  1070 ml  Net 1278.93 ml    LBM: Last BM Date : 11/18/21 Baseline Weight: Weight: 67.9 kg Most recent weight: Weight: 69.2 kg       Palliative Assessment/Data:      Patient Active Problem List   Diagnosis Date Noted   Goals of care, counseling/discussion    General weakness    Encounter for palliative care    Mediastinal lymphadenopathy    Pneumonia 11/10/2021   SIRS (systemic inflammatory response syndrome) (Kurt Foley) 11/09/2021   Pulmonary embolism (Kurt Foley) 10/29/2021   Tinea corporis and facialis 10/07/2021   Acute respiratory infection    Tinea barbae    Dyspnea 10/06/2021   Mass of upper lobe of right lung 10/06/2021   Cough 09/14/2021   Insomnia 08/08/2021   Pancytopenia (Kurt Foley) 08/08/2021   Abnormal LFTs 08/08/2021   Neutropenia (Kurt Foley) 08/08/2021   Sinus tachycardia    Encephalitis 07/22/2021   Debility 07/20/2021   Hypokalemia 07/19/2021   Hyponatremia 07/18/2021   Malnutrition of moderate degree 07/15/2021   FTT (failure to thrive) in adult 07/13/2021   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   CVA (cerebral vascular accident) (Kurt Foley) 07/08/2021   History of CVA with residual right hemiparesis 05/25/2021   Rash of mouth present on examination 03/25/2021   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Kurt Foley) 03/25/2021   Need for prophylactic vaccination and inoculation against smallpox 03/25/2021   Numbness and tingling of right arm 03/24/2021   AIDS (acquired immune  deficiency syndrome) (Kurt Foley) 03/05/2021   Emphysema, unspecified ([place]) 03/05/2021   Cocaine-induced mood disorder ([place])    Tinea pedis 08/06/2019   At risk for opportunistic infections 07/14/2019   AKI (acute kidney injury) (Kurt Foley) 06/02/2019   Generalized weakness 06/02/2019   Cocaine abuse (Kurt Foley) 11/25/2018   Major depressive disorder, recurrent severe without psychotic features (Kurt Foley) 11/25/2018   Substance induced mood disorder (Kurt Foley) 09/16/2015   Cocaine use disorder, severe, dependence ([place])    Cannabis use disorder, severe, dependence (Kurt Foley) 04/09/2015   Severe recurrent major depression without psychotic features (Kurt Foley) 04/08/2015   Alcohol abuse 04/08/2015   Screening examination for venereal disease 03/25/2015   Encounter for long-term (current) use of medications 03/25/2015   Tobacco abuse    Visual disturbance 11/18/2013   Human immunodeficiency virus (HIV) disease (Kurt Foley) 10/30/2013   Atopic dermatitis 10/30/2013    Palliative Care Assessment & Plan   Patient Profile:    Assessment: 46 year old gentleman with advanced AIDS, disseminated MAC, substance use, depression prior stroke with residual right-sided weakness, history of pulmonary embolism, admitted with cough and palpitations.  CT scan concerning for possible lung mass, underwent bronchoscopy, infectious disease following.  Has right upper lobe mass.  Bronchoscopy brushings negative, consideration was being given to pursuing biopsy however plan is to proceed with outpatient PET scan.  Hospital course complicated by SIRS, patient off-and-on spiking fevers.  Remains on azithromycin. Palliative medicine team following for CODE STATUS and broad goals of care discussions.  Recommendations/Plan: Full code, full scope care as per patient's family's wishes. ID following, continue current mode of care. PMT to follow peripherally.    Goals of Care and Additional Recommendations: Limitations on Scope of Treatment: Full Scope  Treatment  Code Status:    Code Status Orders  (From admission, onward)           Start     Ordered   11/09/21 1838  Full code  Continuous        11/09/21 1840  Code Status History     Date Active Date Inactive Code Status Order ID Comments User Context   10/29/2021 2157 11/02/2021 2052 Full Code [id number]  Orene Desanctis, DO ED   10/06/2021 1134 10/07/2021 2015 Full Code [id number]  Jonnie Finner, DO Inpatient   07/20/2021 1310 08/06/2021 1733 Partial Code [id number]  Flora Lipps Inpatient   07/13/2021 2037 07/20/2021 1258 Partial Code [id number]  Florencia Reasons, MD Inpatient   07/13/2021 1928 07/13/2021 2037 Partial Code [id number]  Florencia Reasons, MD Inpatient   06/02/2019 0444 06/04/2019 2009 Full Code [id number]  Edmonia Lynch, DO ED   11/25/2018 1810 11/28/2018 1921 Full Code [id number]  Patrecia Pour, NP Inpatient   11/25/2018 0351 11/25/2018 1738 Full Code [id number]  Margette Fast, MD ED   11/24/2018 2056 11/25/2018 0351 Full Code [id number]  Francine Graven, DO ED   10/09/2015 2143 10/12/2015 1706 Full Code [id number]  Delfin Gant, NP Inpatient   10/09/2015 0457 10/09/2015 2143 Full Code [id number]  Charlann Lange, PA-C ED   09/15/2015 0042 09/16/2015 1830 Full Code [id number]  Ronnie Doss, RN Inpatient   09/14/2015 1705 09/14/2015 2334 Full Code [id number]  Merrily Pew, MD ED   04/07/2015 1833 04/15/2015 2119 Full Code [id number]  Kerrie Buffalo, NP Inpatient   04/06/2015 2106 04/07/2015 1833 Full Code [id number]  Junius Creamer, NP ED   04/06/2015 2105 04/06/2015 2106 Full Code [id number]  Junius Creamer, NP ED   03/20/2015 0434 03/21/2015 1547 Full Code [id number]  Theressa Millard, MD Inpatient       Prognosis:  guarded  Discharge Planning: To Be Determined  Care plan was discussed with patient, additionally call placed and discussed in detail with [name] as per patient's wishes.   Thank you for allowing the Palliative Medicine Team to assist in the care of this  patient.   Time In: 1300 Time Out: 1335 Total Time 35 Prolonged Time Billed No       Greater than 50%  of this time was spent counseling and coordinating care related to the above assessment and plan.  Loistine Chance, MD  Please contact Palliative Medicine Team phone at [phone] for questions and concerns.

## 2021-11-20 NOTE — Progress Notes (Signed)
ID PROGRESS NOTE   Patient still having fevers   BP 105/73 (BP Location: Left Arm)   Pulse (!) 118   Temp (!) 101.3 F (38.5 C) (Oral)   Resp 20   Ht _0  (1.676 m)   Wt 69.2 kg   SpO2 96%   BMI 24.62 kg/m   A/P: disseminated mac  - will add 3rd agent of rifabutin 33m daily in addn to ethambutol 8015mdaily and azithromycin 5005maily - hiv disease = continue with biktarvy - oi proph = continue with bactrim  [facility]yn[facility]aren [facility]ins [facility] [phone]

## 2021-11-20 NOTE — Progress Notes (Signed)
PROGRESS NOTE    Kurt Foley  HQI:[id number] DOB: Jun 21, 1975 DOA: 11/09/2021 PCP: Gildardo Pounds, NP   Brief Narrative:  46 year old with advanced AIDS, disseminated MAC substance abuse, depression, prior CVA with residual right-sided weakness, pulmonary embolism on Xarelto admitted for cough and palpitations.  Repeat CT showed abnormal lung parenchyma.  He was seen by pulmonary, cultures remain negative.  Underwent bronchoscopy with EBUS on 6/5 with subcarinal lymph node sampling and brushing with BAL of right upper lobe mass.  Brushings remain negative, IR consulted for CT-guided biopsy.  Patient off-and-on spiking fever, discussed with ID and this is likely due to MAC.  Patient has also been seen by palliative care service, wishes to be full code.   Assessment & Plan:  Principal Problem:   SIRS (systemic inflammatory response syndrome) (HCC) Active Problems:   Mass of upper lobe of right lung   Human immunodeficiency virus (HIV) disease (HCC)   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HCC)   Emphysema, unspecified (HCC)   Abnormal LFTs   History of CVA with residual right hemiparesis   Pulmonary embolism (HCC)   Severe recurrent major depression without psychotic features ([place])   Insomnia   Pneumonia   Mediastinal lymphadenopathy   Goals of care, counseling/discussion   General weakness   Encounter for palliative care     Assessment and Plan: SIRS - Seen by ID.  This is likely secondary to disseminated MAC history.  Current regimen-Biktarvy, azithromycin and ethambutol.  Continues to spike fever, discussed with Dr. Baxter Flattery again who will help with their input today  Sinus Tachycardia - Patient continues to spike fever overnight.  Poor oral intake therefore getting IV fluids.  Continue Toprol-XL 100 mg daily.  Right upper lobe mass -Status post bronchoscopy with EBUS on 6/5.  Brushings remain negative.  Case discussed with IR who recommends outptn PET scan.  Spoke with Dr Lamonte Sakai from [place], who also reviewed images and recommended outptn PET followed by follow up with Dr Valeta Harms for discussion of possible Navigation Bronch. There may be a component of infection which may clear with time?    AKI -Resolved   Pulmonary embolus Transition back to Xarelto.   History of CVA -Residual right-sided weakness.  On statin  Emphysema -Bronchodilators  HIV with history of MAI. -Infectious diseases following.  currently on Biktarvy.  On azithromycin daily as well.  Substance use -Counseled to quit using.  Depression -On Seroquel and Remeron  GERD -Daily PPI   Transaminitis -Trend LFTs   Goals of care -Advanced HIV with very guarded prognosis.  -Palliative care following-he wishes to be full code  DVT prophylaxis:   Xarelto Code Status: Full code Family Communication:  Called Hoyle Sauer  Status is: Inpatient Remains inpatient appropriate because: Patient continues to spike fever leading to tachycardia.  Signs/Symptoms: estimated needs  Interventions: Refer to RD note for recommendations, Ensure Enlive (each supplement provides 350kcal and 20 grams of protein)  Body mass index is 24.62 kg/m.     Subjective: Still spiking fever and gets tachycardic. + Cough  Examination: Constitutional: Not in acute distress. Temporal wasting.  Respiratory: bibasilar rhonchi.  Cardiovascular: Normal sinus rhythm, no rubs Abdomen: Nontender nondistended good bowel sounds Musculoskeletal: No edema noted Skin: No rashes seen Neurologic: CN 2-12 grossly intact.  And nonfocal Psychiatric: Normal judgment and insight. Alert and oriented x 3. Normal mood.    Objective: Vitals:   11/19/21 1944 11/19/21 1957 11/20/21 0539 11/20/21 0800  BP:  104/78 99/70 130/76  Pulse:  95 (!)  119   Resp:  _0 Temp:  98.8 F (37.1 C) 98.3 F (36.8 C) (!) 100.9 F (38.3 C)  TempSrc:  Oral Oral Oral  SpO2: 95% 96% 96% 98%  Weight:      Height:         Intake/Output Summary (Last 24 hours) at 11/20/2021 0831 Last data filed at 11/20/2021 0758 Gross per 24 hour  Intake 1988.93 ml  Output 1720 ml  Net 268.93 ml   Filed Weights   11/09/21 2040 11/18/21 0430  Weight: 67.9 kg 69.2 kg     Data Reviewed:   CBC: Recent Labs  Lab 11/16/21 0429 11/17/21 0451 11/18/21 0506 11/19/21 1143 11/20/21 0531  WBC 3.7* 6.8 5.7 3.8* 4.0  HGB 8.6* 9.0* 9.6* 9.0* 8.3*  HCT 28.2* 30.2* 31.4* 29.2* 27.8*  MCV 91.3 95.0 92.4 89.6 92.4  PLT 389 338 349 328 546   Basic Metabolic Panel: Recent Labs  Lab 11/15/21 0211 11/17/21 0451 11/18/21 0506 11/19/21 1143 11/20/21 0531  NA 138 138 138 137 138  K 4.4 3.8 4.2 4.3 3.9  CL 108 109 109 107 109  CO2 19* 18* 19* 19* 19*  GLUCOSE 163* 112* 100* 104* 109*  BUN 18 21* _1 CREATININE 0.81 0.88 0.87 0.85 0.71  CALCIUM 9.8 9.2 9.6 9.9 9.1  MG  --  1.7 1.9 1.7 1.8   GFR: Estimated Creatinine Clearance: 103 mL/min (by C-G formula based on SCr of 0.71 mg/dL). Liver Function Tests: Recent Labs  Lab 11/15/21 0211 11/17/21 0451 11/18/21 0506 11/19/21 1143 11/20/21 0531  AST 64* 79* 47* 41 47*  ALT 79* 93* 74* 57* 53*  ALKPHOS 274* 214* 229* 238* 218*  BILITOT 0.6 0.6 0.7 0.6 0.5  PROT 7.9 7.2 7.9 7.9 7.5  ALBUMIN 3.0* 2.9* 3.2* 2.9* 2.7*   No results for input(s): "LIPASE", "AMYLASE" in the last 168 hours. No results for input(s): "AMMONIA" in the last 168 hours. Coagulation Profile: No results for input(s): "INR", "PROTIME" in the last 168 hours.  Cardiac Enzymes: No results for input(s): "CKTOTAL", "CKMB", "CKMBINDEX", "TROPONINI" in the last 168 hours. BNP (last 3 results) No results for input(s): "PROBNP" in the last 8760 hours. HbA1C: No results for input(s): "HGBA1C" in the last 72 hours. CBG: Recent Labs  Lab 11/16/21 2046  GLUCAP 87   Lipid Profile: No results for input(s): "CHOL", "HDL", "LDLCALC", "TRIG", "CHOLHDL", "LDLDIRECT" in the last 72 hours. Thyroid  Function Tests: No results for input(s): "TSH", "T4TOTAL", "FREET4", "T3FREE", "THYROIDAB" in the last 72 hours. Anemia Panel: No results for input(s): "VITAMINB12", "FOLATE", "FERRITIN", "TIBC", "IRON", "RETICCTPCT" in the last 72 hours. Sepsis Labs: Recent Labs  Lab 11/16/21 1900  PROCALCITON 1.05    Recent Results (from the past 240 hour(s))  Acid Fast Smear (AFB)     Status: None   Collection Time: 11/12/21  1:12 AM   Specimen: Vein; Blood  Result Value Ref Range Status   AFB Specimen Processing Concentration  Final    Comment: (NOTE) Performed At: [facility] [place], [place] [id number] Rush Farmer MD [id number]    Acid Fast Smear QNSAFB  Final    Comment: (NOTE) Test not performed. AFB Smear not performed due to specimen source (blood) or insufficient specimen.    Source (AFB) BLD  Final    Comment: Performed at [facility], [place] [address] [place], [place] [address]  Culture, Respiratory w Gram Stain  Status: None   Collection Time: 11/14/21  2:42 PM   Specimen: Bronchial Washing, Right; Respiratory  Result Value Ref Range Status   Specimen Description   Final    BRONCHIAL ALVEOLAR LAVAGE RUL Performed at [place] [address] [place], [place] [address]    Special Requests   Final    NONE Performed at [facility], [place] [address] [place], [place] [address]    Gram Stain NO WBC SEEN NO ORGANISMS SEEN   Final   Culture   Final    NO GROWTH 2 DAYS Performed at [facility], [place] [address] [place], [place] [address]    Report Status 11/17/2021 FINAL  Final  Anaerobic culture w Gram Stain     Status: None (Preliminary result)   Collection Time: 11/14/21  2:42 PM   Specimen: Bronchial Washing, Right; Respiratory  Result Value Ref Range Status   Specimen Description   Final    BRONCHIAL ALVEOLAR LAVAGE RUL Performed at [place] [address], [place], [place] [address]    Special Requests   Final    NONE Performed at [facility], [place] [address] [place], [place] [address]    Culture   Final    NO ANAEROBES ISOLATED; CULTURE IN PROGRESS FOR 5 DAYS   Report Status PENDING  Incomplete  Culture, blood (Routine X 2) w Reflex to ID Panel     Status: None (Preliminary result)   Collection Time: 11/16/21  7:00 PM   Specimen: BLOOD LEFT FOREARM  Result Value Ref Range Status   Specimen Description BLOOD LEFT FOREARM  Final   Special Requests IN PEDIATRIC BOTTLE Blood Culture adequate volume  Final   Culture   Final    NO GROWTH 3 DAYS Performed at [facility], [place] [address], [place], [place] [address]    Report Status PENDING  Incomplete  Culture, blood (Routine X 2) w Reflex to ID Panel     Status: None (Preliminary result)   Collection Time: 11/16/21  7:00 PM   Specimen: BLOOD LEFT WRIST  Result Value Ref Range Status   Specimen Description   Final    BLOOD LEFT WRIST BLOOD Performed at [place] [address], [place], [place] [address]    Special Requests IN PEDIATRIC BOTTLE Blood Culture adequate volume  Final   Culture   Final    NO GROWTH 3 DAYS Performed at [facility], [place] [address] [place], [place] [address]    Report Status PENDING  Incomplete         Radiology Studies: No results found.      Scheduled Meds:  azithromycin  500 mg Oral Daily   B-complex with vitamin C  1 tablet Oral Daily   bictegravir-emtricitabine-tenofovir AF  1 tablet Oral Daily   ethambutol  800 mg Oral Daily   feeding supplement  237 mL Oral TID BM   ketoconazole   Topical BID   metoprolol succinate  100 mg Oral q morning   mirtazapine  15 mg Oral QHS   mometasone-formoterol  2 puff Inhalation BID   pantoprazole  40 mg Oral QAC breakfast   QUEtiapine  200 mg Oral QHS   rivaroxaban  20 mg Oral Q supper   rosuvastatin  5 mg Oral q morning   sodium  chloride flush  3 mL Intravenous Q12H   umeclidinium bromide  1 puff Inhalation Daily  Continuous Infusions:  sodium chloride 75 mL/hr at 11/20/21 0536   lactated ringers Stopped (11/20/21 0759)     LOS: 10 days   Time spent= 35 mins    Desarae Placide Arsenio Loader, MD [facility]  If 7PM-7AM, please contact night-coverage  11/20/2021, 8:31 AM

## 2021-11-21 ENCOUNTER — Ambulatory Visit: Payer: Medicaid Other | Admitting: Physical Therapy

## 2021-11-21 ENCOUNTER — Inpatient Hospital Stay: Payer: Self-pay | Admitting: Pulmonary Disease

## 2021-11-21 DIAGNOSIS — Z8619 Personal history of other infectious and parasitic diseases: Secondary | ICD-10-CM

## 2021-11-21 DIAGNOSIS — F1990 Other psychoactive substance use, unspecified, uncomplicated: Secondary | ICD-10-CM

## 2021-11-21 DIAGNOSIS — E43 Unspecified severe protein-calorie malnutrition: Secondary | ICD-10-CM

## 2021-11-21 DIAGNOSIS — R000 Tachycardia, unspecified: Secondary | ICD-10-CM

## 2021-11-21 DIAGNOSIS — R6510 Systemic inflammatory response syndrome (SIRS) of non-infectious origin without acute organ dysfunction: Secondary | ICD-10-CM | POA: Diagnosis not present

## 2021-11-21 LAB — CBC
HCT: 29 % — ABNORMAL LOW (ref 39.0–52.0)
Hemoglobin: 8.7 g/dL — ABNORMAL LOW (ref 13.0–17.0)
MCH: 27.8 pg (ref 26.0–34.0)
MCHC: 30 g/dL (ref 30.0–36.0)
MCV: 92.7 fL (ref 80.0–100.0)
Platelets: 341 10*3/uL (ref 150–400)
RBC: 3.13 MIL/uL — ABNORMAL LOW (ref 4.22–5.81)
RDW: 16.7 % — ABNORMAL HIGH (ref 11.5–15.5)
WBC: 4.9 10*3/uL (ref 4.0–10.5)
nRBC: 0 % (ref 0.0–0.2)

## 2021-11-21 LAB — CULTURE, BLOOD (ROUTINE X 2)
Culture: NO GROWTH
Culture: NO GROWTH
Special Requests: ADEQUATE
Special Requests: ADEQUATE

## 2021-11-21 LAB — COMPREHENSIVE METABOLIC PANEL
ALT: 54 U/L — ABNORMAL HIGH (ref 0–44)
AST: 54 U/L — ABNORMAL HIGH (ref 15–41)
Albumin: 2.8 g/dL — ABNORMAL LOW (ref 3.5–5.0)
Alkaline Phosphatase: 230 U/L — ABNORMAL HIGH (ref 38–126)
Anion gap: 9 (ref 5–15)
BUN: 12 mg/dL (ref 6–20)
CO2: 19 mmol/L — ABNORMAL LOW (ref 22–32)
Calcium: 9.4 mg/dL (ref 8.9–10.3)
Chloride: 109 mmol/L (ref 98–111)
Creatinine, Ser: 0.89 mg/dL (ref 0.61–1.24)
GFR, Estimated: >60 mL/min (ref >60)
Glucose, Bld: 89 mg/dL (ref 70–99)
Potassium: 4.2 mmol/L (ref 3.5–5.1)
Sodium: 137 mmol/L (ref 135–145)
Total Bilirubin: 0.7 mg/dL (ref 0.3–1.2)
Total Protein: 7.3 g/dL (ref 6.5–8.1)

## 2021-11-21 LAB — MAGNESIUM: Magnesium: 1.8 mg/dL (ref 1.7–2.4)

## 2021-11-21 MED ORDER — SODIUM CHLORIDE 0.9 % IV SOLN: INTRAVENOUS | Status: DC

## 2021-11-21 MED ORDER — MEGESTROL ACETATE 400 MG/10ML PO SUSP
200.0000 mg | Freq: Two times a day (BID) | ORAL | Status: DC
Start: 2021-11-21 — End: 2021-11-21

## 2021-11-21 MED ORDER — LACTATED RINGERS IV BOLUS
500.0000 mL | Freq: Once | INTRAVENOUS | Status: AC
  Administered 2021-11-21: 500 mL via INTRAVENOUS

## 2021-11-21 MED ORDER — SODIUM CHLORIDE 0.9 % IV BOLUS
500.0000 mL | Freq: Once | INTRAVENOUS | Status: AC
  Administered 2021-11-21: 500 mL via INTRAVENOUS

## 2021-11-21 MED ORDER — DRONABINOL 2.5 MG PO CAPS
2.5000 mg | ORAL_CAPSULE | Freq: Two times a day (BID) | ORAL | Status: DC
Start: 2021-11-21 — End: 2021-11-24
  Administered 2021-11-21 – 2021-11-24 (×7): 2.5 mg via ORAL
  Filled 2021-11-21 (×7): qty 1

## 2021-11-21 NOTE — Progress Notes (Addendum)
PT Cancellation Note  Patient Details Name: Kurt Foley MRN: [id number] DOB: 05-23-1976   Cancelled Treatment:    Reason Eval/Treat Not Completed: Patient declined, no reason specified (pt requesting PT return at later time as his back pain is limiting him. Checked back at later time and HR elevated to 126 bpm with pt supine in bed. Will follow up at later date/time as schedule allows.)   Renaldo Fiddler PT, DPT Acute Rehabilitation Services Office [phone] Pager [phone]  11/21/21 10:07 AM

## 2021-11-21 NOTE — Progress Notes (Signed)
PROGRESS NOTE    Kurt Foley  VFI:[id number] DOB: [birth year] DOA: 11/09/2021 PCP: Gildardo Pounds, NP   Brief Narrative:  46 year old with advanced AIDS, disseminated MAC substance abuse, depression, prior CVA with residual right-sided weakness, pulmonary embolism on Xarelto admitted for cough and palpitations.  Repeat CT showed abnormal lung parenchyma.  He was seen by pulmonary, cultures remain negative.  Underwent bronchoscopy with EBUS on 6/5 with subcarinal lymph node sampling and brushing with BAL of right upper lobe mass.  Brushings remain negative, IR consulted for CT-guided biopsy.  Patient off-and-on spiking fever, discussed with ID and this is likely due to MAC.  Patient has also been seen by palliative care service, wishes to be full code.   Assessment & Plan:  Principal Problem:   SIRS (systemic inflammatory response syndrome) (HCC) Active Problems:   Mass of upper lobe of right lung   Human immunodeficiency virus (HIV) disease (HCC)   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HCC)   Emphysema, unspecified (HCC)   Abnormal LFTs   History of CVA with residual right hemiparesis   Pulmonary embolism (HCC)   Severe recurrent major depression without psychotic features ([place])   Insomnia   Pneumonia   Mediastinal lymphadenopathy   Goals of care, counseling/discussion   General weakness   Encounter for palliative care     Assessment and Plan: SIRS History of disseminated MAC infection - Seen by ID.  This is likely secondary to disseminated MAC history.  Current regimen-Biktarvy, azithromycin and ethambutol.  ID added rifabutin  Sinus Tachycardia - Still spiking low-grade temperatures and off-and-on having tachycardia.  Continue Toprol-XL 100 mg daily  Right upper lobe mass -Status post bronchoscopy with EBUS on 6/5.  Brushings remain negative.  Case discussed with IR who recommends outptn PET scan. Spoke with Dr Lamonte Sakai from [place], who also reviewed images  and recommended outptn PET followed by follow up with Dr Valeta Harms for discussion of possible Navigation Bronch. There may be a component of infection which may clear with time?    AKI -Resolved   Pulmonary embolus Transition back to Xarelto.   History of CVA -Residual right-sided weakness.  On statin  Emphysema -Bronchodilators  HIV with history of MAI. -Infectious diseases following.  currently on Biktarvy.  On azithromycin daily as well.  Substance use -Counseled to quit using.  Depression -On Seroquel and Remeron  GERD -Daily PPI   Transaminitis -Trend LFTs   Goals of care Severe protein calorie malnutrition -Advanced HIV with very guarded prognosis.  -Palliative care following-he wishes to be full code -Supplements.  Marinol added  DVT prophylaxis:   Xarelto Code Status: Full code Family Communication: Hoyle Sauer updated periodically  Status is: Inpatient Remains inpatient appropriate because: Patient continues to spike fever leading to tachycardia.  Signs/Symptoms: estimated needs  Interventions: Refer to RD note for recommendations, Ensure Enlive (each supplement provides 350kcal and 20 grams of protein)  Body mass index is 24.62 kg/m.     Subjective: Had some fevers overnight followed by tachycardia. This morning appears tired.  Poor oral intake for last 24 hours.  Examination: Constitutional: Not in acute distress, frail-appearing.  Chronically ill. Respiratory: Clear to auscultation bilaterally Cardiovascular: Normal sinus rhythm, no rubs Abdomen: Nontender nondistended good bowel sounds Musculoskeletal: No edema noted Skin: No rashes seen Neurologic: CN 2-12 grossly intact.  And nonfocal Psychiatric: Normal judgment and insight. Alert and oriented x 3. Normal mood.  Objective: Vitals:   11/20/21 2205 11/21/21 0037 11/21/21 0453 11/21/21 0739  BP: 125/86 96/66 115/88  Pulse: (!) 121 (!) 110 (!) 109   Resp: _0 Temp: (!) 101 F (38.3  C) 98.9 F (37.2 C) 99 F (37.2 C)   TempSrc: Oral Oral    SpO2: 92% 100% 99% 98%  Weight:      Height:        Intake/Output Summary (Last 24 hours) at 11/21/2021 0753 Last data filed at 11/21/2021 0630 Gross per 24 hour  Intake 2568.64 ml  Output 1475 ml  Net 1093.64 ml   Filed Weights   11/09/21 2040 11/18/21 0430  Weight: 67.9 kg 69.2 kg     Data Reviewed:   CBC: Recent Labs  Lab 11/17/21 0451 11/18/21 0506 11/19/21 1143 11/20/21 0531 11/21/21 0431  WBC 6.8 5.7 3.8* 4.0 4.9  HGB 9.0* 9.6* 9.0* 8.3* 8.7*  HCT 30.2* 31.4* 29.2* 27.8* 29.0*  MCV 95.0 92.4 89.6 92.4 92.7  PLT 338 349 328 345 709   Basic Metabolic Panel: Recent Labs  Lab 11/17/21 0451 11/18/21 0506 11/19/21 1143 11/20/21 0531 11/21/21 0431  NA 138 138 137 138 137  K 3.8 4.2 4.3 3.9 4.2  CL 109 109 107 109 109  CO2 18* 19* 19* 19* 19*  GLUCOSE 112* 100* 104* 109* 89  BUN 21* _1 CREATININE 0.88 0.87 0.85 0.71 0.89  CALCIUM 9.2 9.6 9.9 9.1 9.4  MG 1.7 1.9 1.7 1.8 1.8   GFR: Estimated Creatinine Clearance: 92.6 mL/min (by C-G formula based on SCr of 0.89 mg/dL). Liver Function Tests: Recent Labs  Lab 11/17/21 0451 11/18/21 0506 11/19/21 1143 11/20/21 0531 11/21/21 0431  AST 79* 47* 41 47* 54*  ALT 93* 74* 57* 53* 54*  ALKPHOS 214* 229* 238* 218* 230*  BILITOT 0.6 0.7 0.6 0.5 0.7  PROT 7.2 7.9 7.9 7.5 7.3  ALBUMIN 2.9* 3.2* 2.9* 2.7* 2.8*   No results for input(s): "LIPASE", "AMYLASE" in the last 168 hours. No results for input(s): "AMMONIA" in the last 168 hours. Coagulation Profile: No results for input(s): "INR", "PROTIME" in the last 168 hours.  Cardiac Enzymes: No results for input(s): "CKTOTAL", "CKMB", "CKMBINDEX", "TROPONINI" in the last 168 hours. BNP (last 3 results) No results for input(s): "PROBNP" in the last 8760 hours. HbA1C: No results for input(s): "HGBA1C" in the last 72 hours. CBG: Recent Labs  Lab 11/16/21 2046  GLUCAP 87   Lipid  Profile: No results for input(s): "CHOL", "HDL", "LDLCALC", "TRIG", "CHOLHDL", "LDLDIRECT" in the last 72 hours. Thyroid Function Tests: No results for input(s): "TSH", "T4TOTAL", "FREET4", "T3FREE", "THYROIDAB" in the last 72 hours. Anemia Panel: No results for input(s): "VITAMINB12", "FOLATE", "FERRITIN", "TIBC", "IRON", "RETICCTPCT" in the last 72 hours. Sepsis Labs: Recent Labs  Lab 11/16/21 1900  PROCALCITON 1.05    Recent Results (from the past 240 hour(s))  Acid Fast Smear (AFB)     Status: None   Collection Time: 11/12/21  1:12 AM   Specimen: Vein; Blood  Result Value Ref Range Status   AFB Specimen Processing Concentration  Final    Comment: (NOTE) Performed At: [facility] [place] [facility] [place], [place] [id number] Rush Farmer MD [id number]    Acid Fast Smear QNSAFB  Final    Comment: (NOTE) Test not performed. AFB Smear not performed due to specimen source (blood) or insufficient specimen.    Source (AFB) BLD  Final    Comment: Performed at [facility], [place] [address] [place], [place] [address]  Culture, Respiratory w Gram Stain  Status: None   Collection Time: 11/14/21  2:42 PM   Specimen: Bronchial Washing, Right; Respiratory  Result Value Ref Range Status   Specimen Description   Final    BRONCHIAL ALVEOLAR LAVAGE RUL Performed at [place] [address], [place], [place] [address]    Special Requests   Final    NONE Performed at [facility], [place] [address] [place], [place] [address]    Gram Stain NO WBC SEEN NO ORGANISMS SEEN   Final   Culture   Final    NO GROWTH 2 DAYS Performed at [facility], [place] [address] [place], [place] [address]    Report Status 11/17/2021 FINAL  Final  Acid Fast Smear (AFB)     Status: None   Collection Time: 11/14/21  2:42 PM   Specimen: Bronchial Washing, Right; Respiratory  Result Value Ref Range Status   AFB Specimen  Processing Concentration  Final   Acid Fast Smear Negative  Final    Comment: (NOTE) Performed At: [facility] [place], [place] [id number] Rush Farmer MD [id number]    Source (AFB) BRONCHIAL ALVEOLAR LAVAGE  Final    Comment: Performed at [facility], [place] [address] [place], [place] [address]  Anaerobic culture w Gram Stain     Status: None   Collection Time: 11/14/21  2:42 PM   Specimen: Bronchial Washing, Right; Respiratory  Result Value Ref Range Status   Specimen Description   Final    BRONCHIAL ALVEOLAR LAVAGE RUL Performed at [place] [address], [place], [place] [address]    Special Requests   Final    NONE Performed at [facility], [place] [address] [place], [place] [address]    Culture   Final    NO ANAEROBES ISOLATED Performed at [facility], [place] [address], [place], [place] [address]    Report Status 11/20/2021 FINAL  Final  Culture, blood (Routine X 2) w Reflex to ID Panel     Status: None (Preliminary result)   Collection Time: 11/16/21  7:00 PM   Specimen: BLOOD LEFT FOREARM  Result Value Ref Range Status   Specimen Description BLOOD LEFT FOREARM  Final   Special Requests IN PEDIATRIC BOTTLE Blood Culture adequate volume  Final   Culture   Final    NO GROWTH 4 DAYS Performed at [facility], [place] [address] [place], [place] [address]    Report Status PENDING  Incomplete  Culture, blood (Routine X 2) w Reflex to ID Panel     Status: None (Preliminary result)   Collection Time: 11/16/21  7:00 PM   Specimen: BLOOD LEFT WRIST  Result Value Ref Range Status   Specimen Description   Final    BLOOD LEFT WRIST BLOOD Performed at [place] [address] [place], [place] [address]    Special Requests IN PEDIATRIC BOTTLE Blood Culture adequate volume  Final   Culture   Final    NO GROWTH 4 DAYS Performed at [facility],  [place] [address], [place], [place] [address]    Report Status PENDING  Incomplete         Radiology Studies: No results found.      Scheduled Meds:  azithromycin  500 mg Oral Daily   B-complex with vitamin C  1 tablet Oral Daily   bictegravir-emtricitabine-tenofovir AF  1 tablet Oral Daily   ethambutol  800 mg Oral Daily   feeding supplement  237 mL Oral TID BM   ketoconazole   Topical BID   megestrol  200 mg Oral BID   metoprolol succinate  100 mg Oral q morning   mirtazapine  15 mg Oral QHS   mometasone-formoterol  2 puff Inhalation BID   pantoprazole  40 mg Oral QAC breakfast   QUEtiapine  200 mg Oral QHS   rifabutin  300 mg Oral Daily   rivaroxaban  20 mg Oral Q supper   rosuvastatin  5 mg Oral q morning   sodium chloride flush  3 mL Intravenous Q12H   sulfamethoxazole-trimethoprim  1 tablet Oral Daily   umeclidinium bromide  1 puff Inhalation Daily   Continuous Infusions:  sodium chloride 75 mL/hr at 11/20/21 1154   lactated ringers Stopped (11/20/21 0759)     LOS: 11 days   Time spent= 35 mins    Laura Caldas Arsenio Loader, MD [facility]  If 7PM-7AM, please contact night-coverage  11/21/2021, 7:53 AM

## 2021-11-21 NOTE — Progress Notes (Signed)
ID Brief Note   Chart reviewed  Patient having intermittent episodes of fevers since admission with decreasing T max last few days. T max later this afternoon at 103.2 F. Ongoing tachycardia. Fevers presumed to be due to disseminated MAC.  Mild elevation in AST and ALT noted, less likely to be rifabutin related as it was added yesterday only.   Will get a set of blood cultures as last blood cx was several days ago  Continue current therapy for AIDs and MAC as is Monitor fever curve, CMP, pending blood and BAL cx Plan for OP PET as CT guided lung biopsy noted to be high risk Dr Linus Salmons back from tomorrow  Rosiland Oz, MD Infectious Disease Physician [facility] [address] [place],  [address] Phone: [phone]  Fax: [phone]

## 2021-11-22 DIAGNOSIS — R918 Other nonspecific abnormal finding of lung field: Secondary | ICD-10-CM | POA: Diagnosis not present

## 2021-11-22 DIAGNOSIS — Z7189 Other specified counseling: Secondary | ICD-10-CM | POA: Diagnosis not present

## 2021-11-22 DIAGNOSIS — R6510 Systemic inflammatory response syndrome (SIRS) of non-infectious origin without acute organ dysfunction: Secondary | ICD-10-CM | POA: Diagnosis not present

## 2021-11-22 DIAGNOSIS — R531 Weakness: Secondary | ICD-10-CM | POA: Diagnosis not present

## 2021-11-22 DIAGNOSIS — B20 Human immunodeficiency virus [HIV] disease: Secondary | ICD-10-CM | POA: Diagnosis not present

## 2021-11-22 MED ORDER — SODIUM CHLORIDE 0.9 % IV BOLUS
500.0000 mL | Freq: Once | INTRAVENOUS | Status: AC
  Administered 2021-11-22: 500 mL via INTRAVENOUS

## 2021-11-22 MED ORDER — SODIUM CHLORIDE 0.9 % IV SOLN: INTRAVENOUS | Status: AC

## 2021-11-22 NOTE — Progress Notes (Signed)
Daily Progress Note   Patient Name: Kurt Foley       Date: 11/22/2021 DOB: 1975/08/22  Age: 46 y.o. MRN#: [id number] Attending Physician: Damita Lack, MD Primary Care Physician: Gildardo Pounds, NP Admit Date: 11/09/2021  Reason for Consultation/Follow-up: Establishing goals of care  Subjective: Patient still having fevers, chart reviewed, patient seen, he complains of low back pain, no distress currently, PO intake slowly improving,see below.  Length of Stay: 12  Current Medications: Scheduled Meds:   azithromycin  500 mg Oral Daily   B-complex with vitamin C  1 tablet Oral Daily   bictegravir-emtricitabine-tenofovir AF  1 tablet Oral Daily   dronabinol  2.5 mg Oral BID AC   ethambutol  800 mg Oral Daily   feeding supplement  237 mL Oral TID BM   ketoconazole   Topical BID   metoprolol succinate  100 mg Oral q morning   mirtazapine  15 mg Oral QHS   mometasone-formoterol  2 puff Inhalation BID   pantoprazole  40 mg Oral QAC breakfast   QUEtiapine  200 mg Oral QHS   rifabutin  300 mg Oral Daily   rivaroxaban  20 mg Oral Q supper   rosuvastatin  5 mg Oral q morning   sodium chloride flush  3 mL Intravenous Q12H   sulfamethoxazole-trimethoprim  1 tablet Oral Daily   umeclidinium bromide  1 puff Inhalation Daily    Continuous Infusions:  sodium chloride 100 mL/hr at 11/22/21 0757    PRN Meds: acetaminophen (TYLENOL) oral liquid 160 mg/5 mL, acetaminophen **OR** acetaminophen, chlorpheniramine-HYDROcodone, guaiFENesin-dextromethorphan, hydrALAZINE, ibuprofen, ipratropium-albuterol, metoprolol tartrate, morphine, morphine injection, ondansetron (ZOFRAN) IV, polyethylene glycol, senna-docusate  Physical Exam         Chronically ill-appearing Normal respiratory  effort Has sinus tachycardia No edema Awake alert  Vital Signs: BP (!) 82/57 (BP Location: Left Arm)   Pulse 100   Temp 98.6 F (37 C) (Oral)   Resp (!) 23   Ht _0  (1.676 m)   Wt 69.2 kg   SpO2 93%   BMI 24.62 kg/m  SpO2: SpO2: 93 % O2 Device: O2 Device: Room Air O2 Flow Rate: O2 Flow Rate (L/min): 1 L/min  Intake/output summary:  Intake/Output Summary (Last 24 hours) at 11/22/2021 1021 Last data filed at 11/22/2021 0800 Gross per 24  hour  Intake 3324.16 ml  Output 1650 ml  Net 1674.16 ml    LBM: Last BM Date : 11/19/21 Baseline Weight: Weight: 67.9 kg Most recent weight: Weight: 69.2 kg       Palliative Assessment/Data:      Patient Active Problem List   Diagnosis Date Noted   Goals of care, counseling/discussion    General weakness    Encounter for palliative care    Mediastinal lymphadenopathy    Pneumonia 11/10/2021   SIRS (systemic inflammatory response syndrome) ([place]) 11/09/2021   Pulmonary embolism ([place]) 10/29/2021   Tinea corporis and facialis 10/07/2021   Acute respiratory infection    Tinea barbae    Dyspnea 10/06/2021   Mass of upper lobe of right lung 10/06/2021   Cough 09/14/2021   Insomnia 08/08/2021   Pancytopenia ([place]) 08/08/2021   Abnormal LFTs 08/08/2021   Neutropenia ([place]) 08/08/2021   Sinus tachycardia    Encephalitis 07/22/2021   Debility 07/20/2021   Hypokalemia 07/19/2021   Hyponatremia 07/18/2021   Malnutrition of moderate degree 07/15/2021   FTT (failure to thrive) in adult 07/13/2021   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   CVA (cerebral vascular accident) ([place]) 07/08/2021   History of CVA with residual right hemiparesis 05/25/2021   Rash of mouth present on examination 03/25/2021   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[place]) 03/25/2021   Need for prophylactic vaccination and inoculation against smallpox 03/25/2021   Numbness and tingling of right arm 03/24/2021   AIDS  (acquired immune deficiency syndrome) ([place]) 03/05/2021   Emphysema, unspecified ([place]) 03/05/2021   Cocaine-induced mood disorder ([place])    Tinea pedis 08/06/2019   At risk for opportunistic infections 07/14/2019   AKI (acute kidney injury) ([place]) 06/02/2019   Generalized weakness 06/02/2019   Cocaine abuse ([place]) 11/25/2018   Major depressive disorder, recurrent severe without psychotic features ([place]) 11/25/2018   Substance induced mood disorder () 09/16/2015   Cocaine use disorder, severe, dependence ([place])    Cannabis use disorder, severe, dependence ([place]) 04/09/2015   Severe recurrent major depression without psychotic features ([place]) 04/08/2015   Alcohol abuse 04/08/2015   Screening examination for venereal disease 03/25/2015   Encounter for long-term (current) use of medications 03/25/2015   Tobacco abuse    Visual disturbance 11/18/2013   Human immunodeficiency virus (HIV) disease ([place]) 10/30/2013   Atopic dermatitis 10/30/2013    Palliative Care Assessment & Plan   Patient Profile:    Assessment: 46 year old gentleman with advanced AIDS, disseminated MAC, substance use, depression prior stroke with residual right-sided weakness, history of pulmonary embolism, admitted with cough and palpitations.  CT scan concerning for possible lung mass, underwent bronchoscopy, infectious disease following.  Has right upper lobe mass.  Bronchoscopy brushings negative, consideration was being given to pursuing biopsy however plan is to proceed with outpatient PET scan.  Hospital course complicated by SIRS, patient off-and-on spiking fevers.  Remains on azithromycin. Palliative medicine team following for CODE STATUS and broad goals of care discussions.  Recommendations/Plan: Full code, full scope care as per patient and his family's wishes. ID following, continue current mode of care. PMT to follow peripherally.  Pain medication history noted, he is on PRN Po and IV Morphine.   Goals of Care  and Additional Recommendations: Limitations on Scope of Treatment: Full Scope Treatment  Code Status:    Code Status Orders  (From admission, onward)           Start     Ordered   11/09/21 1838  Full code  Continuous        11/09/21 1840           Code Status History     Date Active Date Inactive Code Status Order ID Comments User Context   10/29/2021 2157 11/02/2021 2052 Full Code [id number]  Orene Desanctis, DO ED   10/06/2021 1134 10/07/2021 2015 Full Code [id number]  Jonnie Finner, DO Inpatient   07/20/2021 1310 08/06/2021 1733 Partial Code [id number]  Flora Lipps Inpatient   07/13/2021 2037 07/20/2021 1258 Partial Code [id number]  Florencia Reasons, MD Inpatient   07/13/2021 1928 07/13/2021 2037 Partial Code [id number]  Florencia Reasons, MD Inpatient   06/02/2019 0444 06/04/2019 2009 Full Code [id number]  Edmonia Lynch, DO ED   11/25/2018 1810 11/28/2018 1921 Full Code [id number]  Patrecia Pour, NP Inpatient   11/25/2018 0351 11/25/2018 1738 Full Code [id number]  Margette Fast, MD ED   11/24/2018 2056 11/25/2018 0351 Full Code [id number]  Francine Graven, DO ED   10/09/2015 2143 10/12/2015 1706 Full Code [id number]  Delfin Gant, NP Inpatient   10/09/2015 0457 10/09/2015 2143 Full Code [id number]  Charlann Lange, PA-C ED   09/15/2015 0042 09/16/2015 1830 Full Code [id number]  Ronnie Doss, RN Inpatient   09/14/2015 1705 09/14/2015 2334 Full Code [id number]  Merrily Pew, MD ED   04/07/2015 1833 04/15/2015 2119 Full Code [id number]  Kerrie Buffalo, NP Inpatient   04/06/2015 2106 04/07/2015 1833 Full Code [id number]  Junius Creamer, NP ED   04/06/2015 2105 04/06/2015 2106 Full Code [id number]  Junius Creamer, NP ED   03/20/2015 0434 03/21/2015 1547 Full Code [id number]  Theressa Millard, MD Inpatient       Prognosis:  guarded  Discharge Planning: To Be Determined  Care plan was discussed with patient   Thank you for allowing the Palliative Medicine Team to assist in the care of this patient.   Time  In: 9 Time Out: 9.25 Total Time 25 Prolonged Time Billed No       Greater than 50%  of this time was spent counseling and coordinating care related to the above assessment and plan.  Loistine Chance, MD  Please contact Palliative Medicine Team phone at [phone] for questions and concerns.

## 2021-11-22 NOTE — Progress Notes (Signed)
PT Cancellation Note  Patient Details Name: Kurt Foley MRN: [id number] DOB: 1975/12/08   Cancelled Treatment:     per RN, pt unable to tolerate Physical Therapy today.  Will continue to follow and attempt to see another day.   Felecia Shelling  PTA Acute  Rehabilitation Services Pager      [phone] Office      [phone]

## 2021-11-22 NOTE — Progress Notes (Signed)
PROGRESS NOTE    Kurt Foley  OAC:[id number] DOB: 20-Jan-1976 DOA: 11/09/2021 PCP: Gildardo Pounds, NP   Brief Narrative:  46 year old with advanced AIDS, disseminated MAC substance abuse, depression, prior CVA with residual right-sided weakness, pulmonary embolism on Xarelto admitted for cough and palpitations.  Repeat CT showed abnormal lung parenchyma.  He was seen by pulmonary, cultures remain negative.  Underwent bronchoscopy with EBUS on 6/5 with subcarinal lymph node sampling and brushing with BAL of right upper lobe mass.  Brushings remain negative, IR consulted for CT-guided biopsy.  Patient off-and-on spiking fever, discussed with ID and this is likely due to MAC.  Patient has also been seen by palliative care service, wishes to be full code. Unforturnately continues to spike fevers daily leading to tachycardia.    Assessment & Plan:  Principal Problem:   SIRS (systemic inflammatory response syndrome) (HCC) Active Problems:   Mass of upper lobe of right lung   Human immunodeficiency virus (HIV) disease (HCC)   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HCC)   Emphysema, unspecified (HCC)   Abnormal LFTs   History of CVA with residual right hemiparesis   Pulmonary embolism (H[facility])   Severe recurrent major depression without psychotic features ([place])   Insomnia   Pneumonia   Mediastinal lymphadenopathy   Goals of care, counseling/discussion   General weakness   Encounter for palliative care     Assessment and Plan: SIRS History of disseminated MAC infection - Seen by ID.  This is likely secondary to disseminated MAC history.  Current regimen-Biktarvy, azithromycin and ethambutol. Now on Rifabutin as well.   Sinus Tachycardia with fevers.  - Continues to spike fevers and off-and-on having tachycardia.  Continue Toprol-XL 100 mg daily.  Continue aggressive IV fluids  Right upper lobe mass -Status post bronchoscopy with EBUS on 6/5.  Brushings remain  negative.  Case discussed with IR who recommends outptn PET scan. Spoke with Dr Lamonte Sakai from [facility], who also reviewed images and recommended outptn PET followed by follow up with Dr Valeta Harms for discussion of possible Navigation Bronch. There may be a component of infection which may clear with time?    AKI -Resolved   Pulmonary embolus Transition back to Xarelto.   History of CVA -Residual right-sided weakness.  On statin  Emphysema -Bronchodilators  HIV with history of MAI. -Infectious diseases following.  currently on Biktarvy.  On azithromycin daily as well.  Substance use -Counseled to quit using.  Depression -On Seroquel and Remeron  GERD -Daily PPI   Transaminitis -Trend LFTs   Goals of care Severe protein calorie malnutrition -Advanced HIV with very guarded prognosis.  -Palliative care following-he wishes to be full code -Supplements.  Marinol added  DVT prophylaxis:   Xarelto Code Status: Full code Family Communication: Hoyle Sauer updated periodically  Status is: Inpatient Remains inpatient appropriate because: Patient continues to spike fever leading to tachycardia. Unable to discharge him with periodic fevers,  tachycardia, poor po intake. Palliative has had several conversation with the family regarding goals of care  Signs/Symptoms: estimated needs  Interventions: Refer to RD note for recommendations, Ensure Enlive (each supplement provides 350kcal and 20 grams of protein)  Body mass index is 24.62 kg/m.     Subjective: Seen and examined at bedside, he was tachycardic during my visit with heart rate in 1 teens with soft blood pressure requiring 500 cc normal saline bolus.  His urine has been dark overnight.  Bladder scan shows minimal urine  Examination: Constitutional: Not in acute distress, cachectic frail.  Chronically ill-appearing. Respiratory: Clear to auscultation bilaterally Cardiovascular: Sinus tachycardia Abdomen: Nontender nondistended good  bowel sounds Musculoskeletal: No edema noted Skin: No rashes seen Neurologic: CN 2-12 grossly intact.  And nonfocal Psychiatric: Normal judgment and insight. Alert and oriented x 3. Normal mood.  Objective: Vitals:   11/21/21 2300 11/22/21 0540 11/22/21 0654 11/22/21 0741  BP: 90/63 (!) 156/99 (!) 146/91   Pulse: 96 (!) 118 (!) 134   Resp: 20 18 (!) 30   Temp: 98.4 F (36.9 C) 99.1 F (37.3 C) (!) 103 F (39.4 C)   TempSrc: Oral Oral Oral   SpO2: 96% 99% 96% 94%  Weight:      Height:        Intake/Output Summary (Last 24 hours) at 11/22/2021 0747 Last data filed at 11/22/2021 0545 Gross per 24 hour  Intake 2880.96 ml  Output 2000 ml  Net 880.96 ml   Filed Weights   11/09/21 2040 11/18/21 0430  Weight: 67.9 kg 69.2 kg     Data Reviewed:   CBC: Recent Labs  Lab 11/17/21 0451 11/18/21 0506 11/19/21 1143 11/20/21 0531 11/21/21 0431  WBC 6.8 5.7 3.8* 4.0 4.9  HGB 9.0* 9.6* 9.0* 8.3* 8.7*  HCT 30.2* 31.4* 29.2* 27.8* 29.0*  MCV 95.0 92.4 89.6 92.4 92.7  PLT 338 349 328 345 707   Basic Metabolic Panel: Recent Labs  Lab 11/17/21 0451 11/18/21 0506 11/19/21 1143 11/20/21 0531 11/21/21 0431  NA 138 138 137 138 137  K 3.8 4.2 4.3 3.9 4.2  CL 109 109 107 109 109  CO2 18* 19* 19* 19* 19*  GLUCOSE 112* 100* 104* 109* 89  BUN 21* _0 CREATININE 0.88 0.87 0.85 0.71 0.89  CALCIUM 9.2 9.6 9.9 9.1 9.4  MG 1.7 1.9 1.7 1.8 1.8   GFR: Estimated Creatinine Clearance: 92.6 mL/min (by C-G formula based on SCr of 0.89 mg/dL). Liver Function Tests: Recent Labs  Lab 11/17/21 0451 11/18/21 0506 11/19/21 1143 11/20/21 0531 11/21/21 0431  AST 79* 47* 41 47* 54*  ALT 93* 74* 57* 53* 54*  ALKPHOS 214* 229* 238* 218* 230*  BILITOT 0.6 0.7 0.6 0.5 0.7  PROT 7.2 7.9 7.9 7.5 7.3  ALBUMIN 2.9* 3.2* 2.9* 2.7* 2.8*   No results for input(s): "LIPASE", "AMYLASE" in the last 168 hours. No results for input(s): "AMMONIA" in the last 168 hours. Coagulation  Profile: No results for input(s): "INR", "PROTIME" in the last 168 hours.  Cardiac Enzymes: No results for input(s): "CKTOTAL", "CKMB", "CKMBINDEX", "TROPONINI" in the last 168 hours. BNP (last 3 results) No results for input(s): "PROBNP" in the last 8760 hours. HbA1C: No results for input(s): "HGBA1C" in the last 72 hours. CBG: Recent Labs  Lab 11/16/21 2046  GLUCAP 87   Lipid Profile: No results for input(s): "CHOL", "HDL", "LDLCALC", "TRIG", "CHOLHDL", "LDLDIRECT" in the last 72 hours. Thyroid Function Tests: No results for input(s): "TSH", "T4TOTAL", "FREET4", "T3FREE", "THYROIDAB" in the last 72 hours. Anemia Panel: No results for input(s): "VITAMINB12", "FOLATE", "FERRITIN", "TIBC", "IRON", "RETICCTPCT" in the last 72 hours. Sepsis Labs: Recent Labs  Lab 11/16/21 1900  PROCALCITON 1.05    Recent Results (from the past 240 hour(s))  Fungus Culture With Stain     Status: None (Preliminary result)   Collection Time: 11/14/21  2:42 PM   Specimen: Bronchial Washing, Right; Respiratory  Result Value Ref Range Status   Fungus Stain Final report  Final    Comment: (NOTE) Performed At: [facility] [place]  [place], [place] [id number] Rush Farmer MD VO:[id number]    Fungus (Mycology) Culture PENDING  Incomplete   Fungal Source BRONCHIAL ALVEOLAR LAVAGE  Final    Comment: Performed at [facility] [address] [place], [place] [address]  Culture, Respiratory w Gram Stain     Status: None   Collection Time: 11/14/21  2:42 PM   Specimen: Bronchial Washing, Right; Respiratory  Result Value Ref Range Status   Specimen Description   Final    BRONCHIAL ALVEOLAR LAVAGE RUL Performed at [facility] [address], [place], [place] [address]    Special Requests   Final    NONE Performed at [facility], [place] [address] [place], [place] [address]    Gram Stain NO WBC SEEN NO ORGANISMS SEEN    Final   Culture   Final    NO GROWTH 2 DAYS Performed at [facility], [place] [address] [place], [place] [address]    Report Status 11/17/2021 FINAL  Final  Acid Fast Smear (AFB)     Status: None   Collection Time: 11/14/21  2:42 PM   Specimen: Bronchial Washing, Right; Respiratory  Result Value Ref Range Status   AFB Specimen Processing Concentration  Final   Acid Fast Smear Negative  Final    Comment: (NOTE) Performed At: [facility] [place], [place] [id number] Rush Farmer MD EL:[id number]    Source (AFB) BRONCHIAL ALVEOLAR LAVAGE  Final    Comment: Performed at [facility], [place] [address] [place], [place] [address]  Anaerobic culture w Gram Stain     Status: None   Collection Time: 11/14/21  2:42 PM   Specimen: Bronchial Washing, Right; Respiratory  Result Value Ref Range Status   Specimen Description   Final    BRONCHIAL ALVEOLAR LAVAGE RUL Performed at [facility] [address] [place], [place] [address]    Special Requests   Final    NONE Performed at [facility], [place] [address], [place], [place] [address]    Culture   Final    NO ANAEROBES ISOLATED Performed at [facility], [place] [address] [place], [place] [address]    Report Status 11/20/2021 FINAL  Final  Fungus Culture Result     Status: None   Collection Time: 11/14/21  2:42 PM  Result Value Ref Range Status   Result 1 Comment  Final    Comment: (NOTE) KOH/Calcofluor preparation:  no fungus observed. Performed At: [facility] [place], [place] [id number] Rush Farmer MD JK:[id number]   Culture, blood (Routine X 2) w Reflex to ID Panel     Status: None   Collection Time: 11/16/21  7:00 PM   Specimen: BLOOD LEFT FOREARM  Result Value Ref Range Status   Specimen Description BLOOD LEFT FOREARM  Final   Special Requests IN PEDIATRIC BOTTLE Blood Culture adequate volume  Final    Culture   Final    NO GROWTH 5 DAYS Performed at [facility], [place] [address] [place], [place] [address]    Report Status 11/21/2021 FINAL  Final  Culture, blood (Routine X 2) w Reflex to ID Panel     Status: None   Collection Time: 11/16/21  7:00 PM   Specimen: BLOOD LEFT WRIST  Result Value Ref Range Status   Specimen Description   Final    BLOOD LEFT WRIST BLOOD Performed  at [facility], [place] [address] [place], [place] [address]    Special Requests IN PEDIATRIC BOTTLE Blood Culture adequate volume  Final   Culture   Final    NO GROWTH 5 DAYS Performed at [facility], [place] [address] [place], [place] [address]    Report Status 11/21/2021 FINAL  Final         Radiology Studies: No results found.      Scheduled Meds:  azithromycin  500 mg Oral Daily   B-complex with vitamin C  1 tablet Oral Daily   bictegravir-emtricitabine-tenofovir AF  1 tablet Oral Daily   dronabinol  2.5 mg Oral BID AC   ethambutol  800 mg Oral Daily   feeding supplement  237 mL Oral TID BM   ketoconazole   Topical BID   metoprolol succinate  100 mg Oral q morning   mirtazapine  15 mg Oral QHS   mometasone-formoterol  2 puff Inhalation BID   pantoprazole  40 mg Oral QAC breakfast   QUEtiapine  200 mg Oral QHS   rifabutin  300 mg Oral Daily   rivaroxaban  20 mg Oral Q supper   rosuvastatin  5 mg Oral q morning   sodium chloride flush  3 mL Intravenous Q12H   sulfamethoxazole-trimethoprim  1 tablet Oral Daily   umeclidinium bromide  1 puff Inhalation Daily   Continuous Infusions:  sodium chloride 100 mL/hr at 11/22/21 0721   lactated ringers Stopped (11/20/21 0759)     LOS: 12 days   Time spent= 35 mins    Londa Mackowski Arsenio Loader, MD [facility]  If 7PM-7AM, please contact night-coverage  11/22/2021, 7:47 AM

## 2021-11-22 NOTE — Progress Notes (Signed)
[facility]   Reason for visit: Follow up on HIV, PML, pulmonary mass  Interval History: continues to spike a fever, blood cultures and AFB cultures sent and ngtd.  Poor po intake.     Physical Exam: Constitutional:  Vitals:   11/22/21 0937 11/22/21 1229  BP: (!) 82/57 93/69  Pulse:  96  Resp:  (!) 34  Temp:  98.3 F (36.8 C)  SpO2:  97%   patient appears chronically ill, fatigued Respiratory: Normal respiratory effort; CTA B Cardiovascular: tachy RR  Review of Systems: Constitutional: positive for fevers, sweats, fatigue, and anorexia  Respiratory: negative for cough or sputum  Lab Results  Component Value Date   WBC 4.9 11/21/2021   HGB 8.7 (L) 11/21/2021   HCT 29.0 (L) 11/21/2021   MCV 92.7 11/21/2021   PLT 341 11/21/2021    Lab Results  Component Value Date   CREATININE 0.89 11/21/2021   BUN 12 11/21/2021   NA 137 11/21/2021   K 4.2 11/21/2021   CL 109 11/21/2021   CO2 19 (L) 11/21/2021    Lab Results  Component Value Date   ALT 54 (H) 11/21/2021   AST 54 (H) 11/21/2021   ALKPHOS 230 (H) 11/21/2021     Microbiology: Recent Results (from the past 240 hour(s))  Fungus Culture With Stain     Status: None (Preliminary result)   Collection Time: 11/14/21  2:42 PM   Specimen: Bronchial Washing, Right; Respiratory  Result Value Ref Range Status   Fungus Stain Final report  Final    Comment: (NOTE) Performed At: [facility] [address] [place], [place] [id number] Jolene Schimke MD [id number]    Fungus (Mycology) Culture PENDING  Incomplete   Fungal Source BRONCHIAL ALVEOLAR LAVAGE  Final    Comment: Performed at [facility], [address], [place], [place] [address]  Culture, Respiratory w Gram Stain     Status: None   Collection Time: 11/14/21  2:42 PM   Specimen: Bronchial Washing, Right; Respiratory  Result Value Ref Range Status   Specimen Description   Final    BRONCHIAL ALVEOLAR  LAVAGE RUL Performed at [facility], [address] [place], [place] [address]    Special Requests   Final    NONE Performed at [facility], [address], [place], [place] [address]    Gram Stain NO WBC SEEN NO ORGANISMS SEEN   Final   Culture   Final    NO GROWTH 2 DAYS Performed at [facility], [address], [place], [place] [address]    Report Status 11/17/2021 FINAL  Final  Acid Fast Smear (AFB)     Status: None   Collection Time: 11/14/21  2:42 PM   Specimen: Bronchial Washing, Right; Respiratory  Result Value Ref Range Status   AFB Specimen Processing Concentration  Final   Acid Fast Smear Negative  Final    Comment: (NOTE) Performed At: [facility] [address] [place], [place] [id number] Jolene Schimke MD [id number]    Source (AFB) BRONCHIAL ALVEOLAR LAVAGE  Final    Comment: Performed at [facility], [address] [place], [place] [address]  Anaerobic culture w Gram Stain     Status: None   Collection Time: 11/14/21  2:42 PM   Specimen: Bronchial Washing, Right; Respiratory  Result Value Ref Range Status   Specimen Description   Final    BRONCHIAL ALVEOLAR LAVAGE  RUL Performed at [facility], [address], [place], [place] [address]    Special Requests   Final    NONE Performed at [facility], [address] [place], [place] [address]    Culture   Final    NO ANAEROBES ISOLATED Performed at [facility], [address] [place], [place] [address]    Report Status 11/20/2021 FINAL  Final  Fungus Culture Result     Status: None   Collection Time: 11/14/21  2:42 PM  Result Value Ref Range Status   Result 1 Comment  Final    Comment: (NOTE) KOH/Calcofluor preparation:  no fungus observed. Performed At: [facility] [address] [place], [place] [id number] Jolene Schimke MD [id number]   Culture, blood (Routine  X 2) w Reflex to ID Panel     Status: None   Collection Time: 11/16/21  7:00 PM   Specimen: BLOOD LEFT FOREARM  Result Value Ref Range Status   Specimen Description BLOOD LEFT FOREARM  Final   Special Requests IN PEDIATRIC BOTTLE Blood Culture adequate volume  Final   Culture   Final    NO GROWTH 5 DAYS Performed at [facility], [address] [place], [place] [address]    Report Status 11/21/2021 FINAL  Final  Culture, blood (Routine X 2) w Reflex to ID Panel     Status: None   Collection Time: 11/16/21  7:00 PM   Specimen: BLOOD LEFT WRIST  Result Value Ref Range Status   Specimen Description   Final    BLOOD LEFT WRIST BLOOD Performed at [facility], [address], [place], [place] [address]    Special Requests IN PEDIATRIC BOTTLE Blood Culture adequate volume  Final   Culture   Final    NO GROWTH 5 DAYS Performed at [facility], [address] [place], [place] [address]    Report Status 11/21/2021 FINAL  Final  Culture, blood (Routine X 2) w Reflex to ID Panel     Status: None (Preliminary result)   Collection Time: 11/21/21  4:18 PM   Specimen: BLOOD  Result Value Ref Range Status   Specimen Description   Final    BLOOD BLOOD LEFT FOREARM Performed at [facility], [address] [place], [place] [address]    Special Requests   Final    BOTTLES DRAWN AEROBIC ONLY Blood Culture results may not be optimal due to an inadequate volume of blood received in culture bottles Performed at [facility], [address], [place], [place] [address]    Culture   Final    NO GROWTH < 24 HOURS Performed at [facility], [address] [place], [place] [address]    Report Status PENDING  Incomplete  Culture, blood (Routine X 2) w Reflex to ID Panel     Status: None (Preliminary result)   Collection Time: 11/21/21  4:18 PM   Specimen: BLOOD  Result Value Ref Range Status   Specimen Description   Final     BLOOD BLOOD LEFT WRIST Performed at [facility], [address] [place], [place] [address]    Special Requests   Final    IN PEDIATRIC BOTTLE Blood Culture adequate volume Performed at [facility], [address], [place], [place] [address]    Culture   Final    NO GROWTH < 24 HOURS Performed at [facility], [address] [place], [place] [address]    Report Status PENDING  Incomplete    Impression/Plan:  1. Fever - he continues to spike a fever with a Tmax of 103 this am.  Repeat blood cultures not growing anything and no other obvious source. Lungs clear, no hypoxia, urine without concerns.  I suspect this is mainly from his ongoing underlying issues including MAI infection, pulmonary mass.  Will continue to monitor.   2.  Risk of opportunistic infections - Bactrim restarted on 6/11.    3.  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 No new growth on blood cultures.  Will continue with the same.    4.  HIV - continues to have poor immune reconstitution, despite being on appropriate ARVs.  No changes.    5.  Disposition - needs rehab but with ongoing fevers, not yet a candidate.  Also with poor po intake.  He unfortunately will continue to decline here and likely will decline regardless.  He is not very ambulatory so not really able to go home.  Will continue to monitor his fever and consider rehab if he gets more stable.    Discussed with Dr. Nelson ChimesAmin.

## 2021-11-22 NOTE — Progress Notes (Signed)
   11/22/21 0654  Assess: MEWS Score  Temp (!) 103 F (39.4 C)  BP (!) 146/91  MAP (mmHg) 108  Pulse Rate (!) 134  Resp (!) 30  SpO2 96 %  O2 Device Room Air  Assess: MEWS Score  MEWS Temp 2  MEWS Systolic 0  MEWS Pulse 3  MEWS RR 2  MEWS LOC 0  MEWS Score 7  MEWS Score Color Red  Assess: if the MEWS score is Yellow or Red  Were vital signs taken at a resting state? Yes  Focused Assessment No change from prior assessment  Does the patient meet 2 or more of the SIRS criteria? Yes  Does the patient have a confirmed or suspected source of infection? Yes  Provider and Rapid Response Notified? Yes  MEWS guidelines implemented *See Row Information* No, previously red, continue vital signs every 4 hours  Take Vital Signs  Increase Vital Sign Frequency  Red: Q 1hr X 4 then Q 4hr X 4, if remains red, continue Q 4hrs  Escalate  MEWS: Escalate Red: discuss with charge nurse/RN and provider, consider discussing with RRT  Notify: Charge Nurse/RN  Name of Charge Nurse/RN Notified Rocco Pauls RN  Date Charge Nurse/RN Notified 11/22/21  Time Charge Nurse/RN Notified 2633  Notify: Provider  Provider Name/Title Johann Capers, NP  Date Provider Notified 11/22/21  Time Provider Notified [phone]  Method of Notification  (secure chat)  Notification Reason Change in status  Provider response No new orders  Date of Provider Response 11/22/21  Time of Provider Response 0658  Assess: SIRS CRITERIA  SIRS Temperature  1  SIRS Pulse 1  SIRS Respirations  1  SIRS WBC 0  SIRS Score Sum  3

## 2021-11-23 DIAGNOSIS — R7989 Other specified abnormal findings of blood chemistry: Secondary | ICD-10-CM | POA: Diagnosis not present

## 2021-11-23 DIAGNOSIS — B20 Human immunodeficiency virus [HIV] disease: Secondary | ICD-10-CM | POA: Diagnosis not present

## 2021-11-23 DIAGNOSIS — R509 Fever, unspecified: Secondary | ICD-10-CM

## 2021-11-23 DIAGNOSIS — R531 Weakness: Secondary | ICD-10-CM | POA: Diagnosis not present

## 2021-11-23 DIAGNOSIS — R6510 Systemic inflammatory response syndrome (SIRS) of non-infectious origin without acute organ dysfunction: Secondary | ICD-10-CM | POA: Diagnosis not present

## 2021-11-23 LAB — CBC WITH DIFFERENTIAL/PLATELET
Abs Immature Granulocytes: 0.81 10*3/uL — ABNORMAL HIGH (ref 0.00–0.07)
Basophils Absolute: 0 10*3/uL (ref 0.0–0.1)
Basophils Relative: 1 %
Eosinophils Absolute: 0 10*3/uL (ref 0.0–0.5)
Eosinophils Relative: 0 %
HCT: 26.9 % — ABNORMAL LOW (ref 39.0–52.0)
Hemoglobin: 7.7 g/dL — ABNORMAL LOW (ref 13.0–17.0)
Immature Granulocytes: 13 %
Lymphocytes Relative: 9 %
Lymphs Abs: 0.5 10*3/uL — ABNORMAL LOW (ref 0.7–4.0)
MCH: 27.3 pg (ref 26.0–34.0)
MCHC: 28.6 g/dL — ABNORMAL LOW (ref 30.0–36.0)
MCV: 95.4 fL (ref 80.0–100.0)
Monocytes Absolute: 0.6 10*3/uL (ref 0.1–1.0)
Monocytes Relative: 10 %
Neutro Abs: 4.1 10*3/uL (ref 1.7–7.7)
Neutrophils Relative %: 67 %
Platelets: 301 10*3/uL (ref 150–400)
RBC: 2.82 MIL/uL — ABNORMAL LOW (ref 4.22–5.81)
RDW: 16.9 % — ABNORMAL HIGH (ref 11.5–15.5)
WBC: 6.1 10*3/uL (ref 4.0–10.5)
nRBC: 0 % (ref 0.0–0.2)

## 2021-11-23 LAB — COMPREHENSIVE METABOLIC PANEL
ALT: 46 U/L — ABNORMAL HIGH (ref 0–44)
AST: 56 U/L — ABNORMAL HIGH (ref 15–41)
Albumin: 2.6 g/dL — ABNORMAL LOW (ref 3.5–5.0)
Alkaline Phosphatase: 259 U/L — ABNORMAL HIGH (ref 38–126)
Anion gap: 9 (ref 5–15)
BUN: 10 mg/dL (ref 6–20)
CO2: 15 mmol/L — ABNORMAL LOW (ref 22–32)
Calcium: 8.9 mg/dL (ref 8.9–10.3)
Chloride: 112 mmol/L — ABNORMAL HIGH (ref 98–111)
Creatinine, Ser: 0.9 mg/dL (ref 0.61–1.24)
GFR, Estimated: >60 mL/min (ref >60)
Glucose, Bld: 82 mg/dL (ref 70–99)
Potassium: 4.2 mmol/L (ref 3.5–5.1)
Sodium: 136 mmol/L (ref 135–145)
Total Bilirubin: 0.8 mg/dL (ref 0.3–1.2)
Total Protein: 6.6 g/dL (ref 6.5–8.1)

## 2021-11-23 LAB — MAGNESIUM: Magnesium: 1.8 mg/dL (ref 1.7–2.4)

## 2021-11-23 LAB — PHOSPHORUS: Phosphorus: 4.2 mg/dL (ref 2.5–4.6)

## 2021-11-23 LAB — GLUCOSE, CAPILLARY: Glucose-Capillary: 115 mg/dL — ABNORMAL HIGH (ref 70–99)

## 2021-11-23 MED ORDER — LACTATED RINGERS IV BOLUS
500.0000 mL | Freq: Once | INTRAVENOUS | Status: AC
  Administered 2021-11-23: 500 mL via INTRAVENOUS

## 2021-11-23 MED ORDER — ABACAVIR-DOLUTEGRAVIR-LAMIVUD 600-50-300 MG PO TABS
1.0000 | ORAL_TABLET | Freq: Every day | ORAL | Status: DC
  Administered 2021-11-24 – 2021-12-07 (×14): 1 via ORAL
  Filled 2021-11-23 (×14): qty 1

## 2021-11-23 MED ORDER — LACTATED RINGERS IV BOLUS
500.0000 mL | Freq: Once | INTRAVENOUS | Status: AC
Start: 2021-11-23 — End: 2021-11-23
  Administered 2021-11-23: 500 mL via INTRAVENOUS

## 2021-11-23 MED ORDER — PIPERACILLIN-TAZOBACTAM 3.375 G IVPB
3.3750 g | Freq: Three times a day (TID) | INTRAVENOUS | Status: DC
  Administered 2021-11-23 – 2021-11-28 (×16): 3.375 g via INTRAVENOUS
  Filled 2021-11-23 (×15): qty 50

## 2021-11-23 NOTE — Progress Notes (Signed)
[facility]   Reason for visit: follow up on pulmonary mass, fever, HIV  Interval History: he continues to spike a fever, Tmax 103.3 this am. No new complaints.     Physical Exam: Constitutional:  Vitals:   11/23/21 1210 11/23/21 1310  BP: 122/80 100/69  Pulse: (!) 123 (!) 110  Resp: (!) 32 (!) 30  Temp: 99.6 F (37.6 C) 99 F (37.2 C)  SpO2: 99% 95%  Patient appears chronically ill, fatigued Respiratory: normal respiratory effort, CTA B Cardiovascular: tachy RR  Review of Systems: Constitutional: positive for fever, chills Respiratory: negative for new cough Lab Results  Component Value Date   WBC 6.1 11/23/2021   HGB 7.7 (L) 11/23/2021   HCT 26.9 (L) 11/23/2021   MCV 95.4 11/23/2021   PLT 301 11/23/2021    Lab Results  Component Value Date   CREATININE 0.90 11/23/2021   BUN 10 11/23/2021   NA 136 11/23/2021   K 4.2 11/23/2021   CL 112 (H) 11/23/2021   CO2 15 (L) 11/23/2021    Lab Results  Component Value Date   ALT 46 (H) 11/23/2021   AST 56 (H) 11/23/2021   ALKPHOS 259 (H) 11/23/2021     Microbiology: Recent Results (from the past 240 hour(s))  Fungus Culture With Stain     Status: None (Preliminary result)   Collection Time: 11/14/21  2:42 PM   Specimen: Bronchial Washing, Right; Respiratory  Result Value Ref Range Status   Fungus Stain Final report  Final    Comment: (NOTE) Performed At: [facility] [address] [place], [place] [id number] Rush Farmer MD [id number]    Fungus (Mycology) Culture PENDING  Incomplete   Fungal Source BRONCHIAL ALVEOLAR LAVAGE  Final    Comment: Performed at [facility] [place] [address], [place], [place] [address]  Culture, Respiratory w Gram Stain     Status: None   Collection Time: 11/14/21  2:42 PM   Specimen: Bronchial Washing, Right; Respiratory  Result Value Ref Range Status   Specimen Description   Final    BRONCHIAL ALVEOLAR LAVAGE  RUL Performed at [place] [address], [place], [place] [address]    Special Requests   Final    NONE Performed at [facility], [place] [address], [place], [place] [address]    Gram Stain NO WBC SEEN NO ORGANISMS SEEN   Final   Culture   Final    NO GROWTH 2 DAYS Performed at [facility], [place] [address], [place], [place] [address]    Report Status 11/17/2021 FINAL  Final  Acid Fast Smear (AFB)     Status: None   Collection Time: 11/14/21  2:42 PM   Specimen: Bronchial Washing, Right; Respiratory  Result Value Ref Range Status   AFB Specimen Processing Concentration  Final   Acid Fast Smear Negative  Final    Comment: (NOTE) Performed At: [facility] [place], [place] [id number] Rush Farmer MD [id number]    Source (AFB) BRONCHIAL ALVEOLAR LAVAGE  Final    Comment: Performed at [facility], [place] [address] [place], [place] [address]  Anaerobic culture w Gram Stain     Status: None   Collection Time: 11/14/21  2:42 PM   Specimen: Bronchial Washing, Right; Respiratory  Result Value Ref Range Status   Specimen Description   Final    BRONCHIAL ALVEOLAR LAVAGE RUL Performed at [facility], [place] [address] [place], [place] [address]    Special Requests   Final    NONE Performed at [facility], [place] [address], [place], [place] [address]    Culture   Final    NO ANAEROBES ISOLATED Performed at [facility], [place] [address], [place], [place] [address]    Report Status 11/20/2021 FINAL  Final  Fungus Culture Result     Status: None   Collection Time: 11/14/21  2:42 PM  Result Value Ref Range Status   Result 1 Comment  Final    Comment: (NOTE) KOH/Calcofluor preparation:  no fungus observed. Performed At: [facility] [place], [place] [id number] Rush Farmer MD [id number]   Culture, blood (Routine X 2) w  Reflex to ID Panel     Status: None   Collection Time: 11/16/21  7:00 PM   Specimen: BLOOD LEFT FOREARM  Result Value Ref Range Status   Specimen Description BLOOD LEFT FOREARM  Final   Special Requests IN PEDIATRIC BOTTLE Blood Culture adequate volume  Final   Culture   Final    NO GROWTH 5 DAYS Performed at [facility], [place] [address], [place], [place] [address]    Report Status 11/21/2021 FINAL  Final  Culture, blood (Routine X 2) w Reflex to ID Panel     Status: None   Collection Time: 11/16/21  7:00 PM   Specimen: BLOOD LEFT WRIST  Result Value Ref Range Status   Specimen Description   Final    BLOOD LEFT WRIST BLOOD Performed at [place] [address], [place], [place] [address]    Special Requests IN PEDIATRIC BOTTLE Blood Culture adequate volume  Final   Culture   Final    NO GROWTH 5 DAYS Performed at [facility], [place] [address], [place], [place] [address]    Report Status 11/21/2021 FINAL  Final  Culture, blood (Routine X 2) w Reflex to ID Panel     Status: None (Preliminary result)   Collection Time: 11/21/21  4:18 PM   Specimen: BLOOD  Result Value Ref Range Status   Specimen Description   Final    BLOOD BLOOD LEFT FOREARM Performed at [place] [address] [place], [place] [address]    Special Requests   Final    BOTTLES DRAWN AEROBIC ONLY Blood Culture results may not be optimal due to an inadequate volume of blood received in culture bottles Performed at [place] [address], [place], [place] [address]    Culture   Final    NO GROWTH 2 DAYS Performed at [place] [address], [place], [place] [address]    Report Status PENDING  Incomplete  Culture, blood (Routine X 2) w Reflex to ID Panel     Status: None (Preliminary result)   Collection Time: 11/21/21  4:18 PM   Specimen: BLOOD  Result Value Ref Range Status   Specimen Description   Final    BLOOD  BLOOD LEFT WRIST Performed at [place] [address], [place], [place] [address]    Special Requests   Final    IN PEDIATRIC BOTTLE Blood Culture adequate volume Performed at [place] [address], [place], [place] [address]    Culture   Final    NO GROWTH 2 DAYS Performed at [place] [address],  [place], [place] [address]    Report Status PENDING  Incomplete    Impression/Plan:  1. Fever - he continues to have a high fever and no obvious new source.  It is very possible this is due to his underlying conditions including his lung mass, disseminated MAI infection.  An occult infection also possible, though less likely.  At this point, I am going to start a trial of IV piperacillin/tazobactam for empiric treatment of an occult infection and monitor his fever.     2.  MAI disseminated infection   he continues on three drug therapy.  Will continue.   3.  HIV - some drug interaction with rifabutin so will change him to Triumeq for now.

## 2021-11-23 NOTE — Progress Notes (Signed)
Nutrition Follow-up  DOCUMENTATION CODES:   Not applicable  INTERVENTION:  - continue Ensure TID. - weigh patient today.  - if meal intakes do not improve in the next 48-72 hours, may need to consider feeding tube and initiation of tube feeding for patient who is Full Code  NUTRITION DIAGNOSIS:   Increased nutrient needs related to acute illness, chronic illness as evidenced by estimated needs. -ongoing  GOAL:   Patient will meet greater than or equal to 90% of their needs -unmet  MONITOR:   PO intake, Supplement acceptance, Labs, Weight trends  ASSESSMENT:   46 y.o. male with medical history of substance use, RUL mass pending bronch by Pulmonology to evaluate, HIV/AIDS, PML, MAI, emphysema, CVA with residual R-sided hemiparesis, and depression. He was admitted 5/20-5/24 due to shortness of breath and at that time was noted at that time to have DVT and PE with R-sided heart strain. He returned to the ED due to ongoing shortness of breath, weakness,  cough which is sometimes painful, sensation of his heart racing, and decreased urine output with concern for dehydration. Patient was admitted for SIRS.  Palliative Care last saw patient yesterday. He remains Full Code with guarded prognosis.   Meal completions the past 3 days: 6/11- 0% of breakfast; 50% of lunch 6/12- 50% of breakfast, 0% of lunch, 25% of dinner (also documented as 100% of dinner) 6/13- 0% of breakfast   He has been accepting Ensure 100% of the time offered. Noted marinol order placed starting at 1200 on 6/12 (less than 48 hours ago).  He has not been weighed since 6/9. Non-pitting edema to BLE documented in the edema section of flow sheet yesterday and on 6/7 he was noted to have no edema.   Per notes: - SIRS - on and off fevers - RUL mass s/p bronch and EBUS on 6/5--negative; recommendation for outpatient PET scan - AKI--resolved - advanced HIV--on Biktarvy   Labs reviewed; CBG: 115 mg/dl, Alk Phos  elevated and remaining fairly stable, LFTs elevated mainly trending down.   Medications reviewed; 1 tablet vitamin B complex with vitamin C, 1 tablet biktarvy, 2.5 mg marinol BID started on 6/12, 15 mg remeron/day.   Diet Order:   Diet Order             Diet regular Room service appropriate? Yes; Fluid consistency: Thin  Diet effective now                   EDUCATION NEEDS:   No education needs have been identified at this time  Skin:  Skin Assessment: Reviewed RN Assessment  Last BM:  6/12 (type 4 x1, large amount)  Height:   Ht Readings from Last 1 Encounters:  11/09/21 _0  (1.676 m)    Weight:   Wt Readings from Last 1 Encounters:  11/18/21 69.2 kg     BMI:  Body mass index is 24.62 kg/m.  Estimated Nutritional Needs:  Kcal:  2050-2250 kcal Protein:  102-115 grams Fluid:  >/= 2.2 L/day     Jarome Matin, MS, RD, LDN Registered Dietitian II Inpatient Clinical Nutrition RD pager # and on-call/weekend pager # available in [facility]

## 2021-11-23 NOTE — Plan of Care (Signed)
?  Problem: Health Behavior/Discharge Planning: ?Goal: Ability to manage health-related needs will improve ?Outcome: Progressing ?  ?Problem: Coping: ?Goal: Level of anxiety will decrease ?Outcome: Progressing ?  ?Problem: Safety: ?Goal: Ability to remain free from injury will improve ?Outcome: Progressing ?  ?

## 2021-11-23 NOTE — Progress Notes (Signed)
PROGRESS NOTE    Kurt Foley  QQV:[id number] DOB: 1975-09-30 DOA: 11/09/2021 PCP: Gildardo Pounds, NP   Brief Narrative:  46 year old with advanced AIDS, disseminated MAC substance abuse, depression, prior CVA with residual right-sided weakness, pulmonary embolism on Xarelto admitted for cough and palpitations.  Repeat CT showed abnormal lung parenchyma.  He was seen by pulmonary, cultures remain negative.  Underwent bronchoscopy with EBUS on 6/5 with subcarinal lymph node sampling and brushing with BAL of right upper lobe mass.  Brushings remain negative, IR consulted for CT-guided biopsy.  Patient off-and-on spiking fever, discussed with ID and this is likely due to MAC.  Patient has also been seen by palliative care service, wishes to be full code. Unforturnately continues to spike fevers daily leading to tachycardia.   ID is now changing antibiotics and trying IV Unasyn.  Continues to have temperatures and tachycardia along with some back pain   Assessment and Plan:  SIRS with recurrent fevers History of disseminated MAC infection - Seen by ID.  This is likely secondary to disseminated MAC history.  Current regimen-Biktarvy, azithromycin and ethambutol. Now on Rifabutin as well but given his interactions with drugs he was changed to Triumeq -ID feels that he has no real source of his fever but could be secondary to his disseminated MRSA infection -ID is now adding IV Zosyn for empiric treatment of occult infection and monitoring fever   Sinus Tachycardia with fevers.  - Continues to spike fevers and off-and-on having tachycardia.  Continue Toprol-XL 100 mg daily.  Continue aggressive IV fluids -Also on IV metoprolol   Right upper lobe mass -Status post bronchoscopy with EBUS on 6/5.  Brushings remain negative.  Case discussed with IR who recommends outptn PET scan. Spoke with Dr Lamonte Sakai from [facility], who also reviewed images and recommended outptn PET followed by follow up with Dr  Valeta Harms for discussion of possible Navigation Bronch. There may be a component of infection which may clear with time?  -On IV Zosyn now   AKI Metabolic acidosis - AKI has resolved resolved -Patient's BUNs/creatinine is now 20/0.90 -Patient has a metabolic acidosis now with a CO2 of 15, anion gap of 9, chloride level of 112 -Avoid nephrotoxic medications, contrast dyes, hypotension and adjust medications   Pulmonary embolus Transition back to Xarelto.   History of CVA -Residual right-sided weakness.  On statin  Emphysema -Bronchodilators  HIV with history of MAI. -Infectious diseases following.  currently on Biktarvy.  On azithromycin daily as well and now going on IV Zosyn  Substance use -Counseled to quit using.  Depression -On Seroquel and Remeron  Normocytic Anemia -Patient's hemoglobin/hematocrit is now 7.7/26.9 -Trended down from 9.6/31.4 -Check anemia panel in a.m. -Continue monitor for signs of a bleeding; no overt bleeding noted  GERD -Daily PPI   Transaminitis -Trend LFTs -LFTs have been slightly abnormal and AST is now 56 ALT is now 46 Continue monitor and if necessary will obtain a right upper quadrant ultrasound   Goals of care Severe protein calorie malnutrition -Advanced HIV with very guarded prognosis.  -Palliative care following-he wishes to be full code -Supplements.  Marinol added  DVT prophylaxis:  rivaroxaban (XARELTO) tablet 20 mg    Code Status: Full Code Family Communication: No family currently at bedside  Disposition Plan:  Level of care: Progressive Status is: Inpatient Remains inpatient appropriate because: Remains febrile and tachycardic still and ID is following   Consultants:  Infectious diseases Pulmonary Palliative care medicine Interventional radiology   Procedures:  Procedure(s): Flexible bronchoscopy (62952) Brushing (84132) of the RUL  Bronchial alveolar lavage (44010) of the RUL Endobronchial ultrasound  (27253) Transbronchial needle aspiration (66440) of the station 7   Antimicrobials:  Anti-infectives (From admission, onward)    Start     Dose/Rate Route Frequency Ordered Stop   11/24/21 1000  abacavir-dolutegravir-lamiVUDine (TRIUMEQ) 600-50-300 MG per tablet 1 tablet        1 tablet Oral Daily 11/23/21 1107     11/23/21 1130  piperacillin-tazobactam (ZOSYN) IVPB 3.375 g        3.375 g 12.5 mL/hr over 240 Minutes Intravenous Every 8 hours 11/23/21 1042     11/20/21 1500  rifabutin (MYCOBUTIN) capsule 300 mg        300 mg Oral Daily 11/20/21 1341     11/20/21 1430  sulfamethoxazole-trimethoprim (BACTRIM DS) 800-160 MG per tablet 1 tablet        1 tablet Oral Daily 11/20/21 1342     11/17/21 0800  vancomycin (VANCOCIN) IVPB 1000 mg/200 mL premix  Status:  Discontinued        1,000 mg 200 mL/hr over 60 Minutes Intravenous Every 12 hours 11/16/21 1807 11/17/21 1048   11/16/21 1830  vancomycin (VANCOREADY) IVPB 1500 mg/300 mL        1,500 mg 150 mL/hr over 120 Minutes Intravenous STAT 11/16/21 1758 11/16/21 2048   11/16/21 1800  ceFEPIme (MAXIPIME) 2 g in sodium chloride 0.9 % 100 mL IVPB  Status:  Discontinued        2 g 200 mL/hr over 30 Minutes Intravenous Every 8 hours 11/16/21 1756 11/17/21 1048   11/11/21 2200  azithromycin (ZITHROMAX) tablet 500 mg        500 mg Oral Daily 11/11/21 1511     11/10/21 2200  azithromycin (ZITHROMAX) 500 mg in sodium chloride 0.9 % 250 mL IVPB  Status:  Discontinued        500 mg 250 mL/hr over 60 Minutes Intravenous Every 24 hours 11/10/21 1316 11/11/21 1511   11/10/21 1400  cefTRIAXone (ROCEPHIN) 2 g in sodium chloride 0.9 % 100 mL IVPB  Status:  Discontinued        2 g 200 mL/hr over 30 Minutes Intravenous Every 24 hours 11/10/21 1316 11/11/21 1511   11/10/21 1000  ethambutol (MYAMBUTOL) tablet 800 mg        800 mg Oral Daily 11/09/21 1840     11/10/21 1000  sulfamethoxazole-trimethoprim (BACTRIM DS) 800-160 MG per tablet 1 tablet  Status:   Discontinued        1 tablet Oral Daily 11/09/21 1840 11/10/21 1316   11/09/21 2200  azithromycin (ZITHROMAX) tablet 500 mg  Status:  Discontinued        500 mg Oral Daily at bedtime 11/09/21 1909 11/10/21 1316   11/09/21 1830  vancomycin (VANCOREADY) IVPB 1500 mg/300 mL        1,500 mg 150 mL/hr over 120 Minutes Intravenous STAT 11/09/21 1817 11/09/21 2157   11/09/21 1830  piperacillin-tazobactam (ZOSYN) IVPB 3.375 g        3.375 g 100 mL/hr over 30 Minutes Intravenous STAT 11/09/21 1817 11/09/21 1932   11/09/21 1700  bictegravir-emtricitabine-tenofovir AF (BIKTARVY) 50-200-25 MG per tablet 1 tablet  Status:  Discontinued        1 tablet Oral Daily 11/09/21 1608 11/23/21 1107       Subjective: Seen and examined at bedside and he is still febrile and tachycardic and does not feel as well.  States that he  is cold and needed assistance to go to the bathroom.  He denies any chest pain but has some back pain.  No other concerns or complaints at this time.  Objective: Vitals:   11/23/21 1400 11/23/21 1426 11/23/21 1510 11/23/21 1552  BP: _0 97/73  Pulse: (!) 110 (!) 102 90 80  Resp:  (!) 34 (!) 28 (!) 32  Temp: 99 F (37.2 C) 98.8 F (37.1 C) 98.7 F (37.1 C) 98.4 F (36.9 C)  TempSrc:  Oral  Oral  SpO2: 94% 93% 94% 94%  Weight:      Height:        Intake/Output Summary (Last 24 hours) at 11/23/2021 1959 Last data filed at 11/23/2021 1839 Gross per 24 hour  Intake 1029.93 ml  Output 1550 ml  Net -520.07 ml   Filed Weights   11/09/21 2040 11/18/21 0430  Weight: 67.9 kg 69.2 kg   Examination: Physical Exam:  Constitutional: Thin African-American male, in no acute distress appears uncomfortable and is still tachycardic Respiratory: Diminished to auscultation bilaterally, no wheezing, rales, rhonchi or crackles. Normal respiratory effort and patient is not tachypenic. No accessory muscle use.  Unlabored breathing Cardiovascular: Tachycardic rate and regular  rhythm, no murmurs / rubs / gallops. S1 and S2 auscultated.  Abdomen: Soft, non-tender, non-distended.  Bowel sounds positive.  GU: Deferred. Musculoskeletal: No clubbing / cyanosis of digits/nails. No joint deformity upper and lower extremities.   Neurologic: CN 2-12 grossly intact with no focal deficits.  Romberg sign and cerebellar reflexes not assessed.  Psychiatric: Normal judgment and insight. Alert and oriented x 3. Normal mood and appropriate affect.   Data Reviewed: I have personally reviewed following labs and imaging studies  CBC: Recent Labs  Lab 11/18/21 0506 11/19/21 1143 11/20/21 0531 11/21/21 0431 11/23/21 1029  WBC 5.7 3.8* 4.0 4.9 6.1  NEUTROABS  --   --   --   --  4.1  HGB 9.6* 9.0* 8.3* 8.7* 7.7*  HCT 31.4* 29.2* 27.8* 29.0* 26.9*  MCV 92.4 89.6 92.4 92.7 95.4  PLT 349 328 345 341 417   Basic Metabolic Panel: Recent Labs  Lab 11/18/21 0506 11/19/21 1143 11/20/21 0531 11/21/21 0431 11/23/21 1029  NA 138 137 138 137 136  K 4.2 4.3 3.9 4.2 4.2  CL 109 107 109 109 112*  CO2 19* 19* 19* 19* 15*  GLUCOSE 100* 104* 109* 89 82  BUN _1 CREATININE 0.87 0.85 0.71 0.89 0.90  CALCIUM 9.6 9.9 9.1 9.4 8.9  MG 1.9 1.7 1.8 1.8 1.8  PHOS  --   --   --   --  4.2   GFR: Estimated Creatinine Clearance: 91.6 mL/min (by C-G formula based on SCr of 0.9 mg/dL). Liver Function Tests: Recent Labs  Lab 11/18/21 0506 11/19/21 1143 11/20/21 0531 11/21/21 0431 11/23/21 1029  AST 47* 41 47* 54* 56*  ALT 74* 57* 53* 54* 46*  ALKPHOS 229* 238* 218* 230* 259*  BILITOT 0.7 0.6 0.5 0.7 0.8  PROT 7.9 7.9 7.5 7.3 6.6  ALBUMIN 3.2* 2.9* 2.7* 2.8* 2.6*   No results for input(s): "LIPASE", "AMYLASE" in the last 168 hours. No results for input(s): "AMMONIA" in the last 168 hours. Coagulation Profile: No results for input(s): "INR", "PROTIME" in the last 168 hours. Cardiac Enzymes: No results for input(s): "CKTOTAL", "CKMB", "CKMBINDEX", "TROPONINI" in the last  168 hours. BNP (last 3 results) No results for input(s): "PROBNP" in the last 8760 hours. HbA1C:  No results for input(s): "HGBA1C" in the last 72 hours. CBG: Recent Labs  Lab 11/16/21 2046 11/23/21 0716  GLUCAP 87 115*   Lipid Profile: No results for input(s): "CHOL", "HDL", "LDLCALC", "TRIG", "CHOLHDL", "LDLDIRECT" in the last 72 hours. Thyroid Function Tests: No results for input(s): "TSH", "T4TOTAL", "FREET4", "T3FREE", "THYROIDAB" in the last 72 hours. Anemia Panel: No results for input(s): "VITAMINB12", "FOLATE", "FERRITIN", "TIBC", "IRON", "RETICCTPCT" in the last 72 hours. Sepsis Labs: No results for input(s): "PROCALCITON", "LATICACIDVEN" in the last 168 hours.  Recent Results (from the past 240 hour(s))  Fungus Culture With Stain     Status: None (Preliminary result)   Collection Time: 11/14/21  2:42 PM   Specimen: Bronchial Washing, Right; Respiratory  Result Value Ref Range Status   Fungus Stain Final report  Final    Comment: (NOTE) Performed At: [facility] [place], [place] [id number] Rush Farmer MD XN:[id number]    Fungus (Mycology) Culture PENDING  Incomplete   Fungal Source BRONCHIAL ALVEOLAR LAVAGE  Final    Comment: Performed at [facility], [place] [address], [place], [place] [address]  Culture, Respiratory w Gram Stain     Status: None   Collection Time: 11/14/21  2:42 PM   Specimen: Bronchial Washing, Right; Respiratory  Result Value Ref Range Status   Specimen Description   Final    BRONCHIAL ALVEOLAR LAVAGE RUL Performed at [place] [address], [place], [place] [address]    Special Requests   Final    NONE Performed at [facility], [place] [address] [place], [place] [address]    Gram Stain NO WBC SEEN NO ORGANISMS SEEN   Final   Culture   Final    NO GROWTH 2 DAYS Performed at [facility], [place] [address], [place], [place] [address]    Report  Status 11/17/2021 FINAL  Final  Acid Fast Smear (AFB)     Status: None   Collection Time: 11/14/21  2:42 PM   Specimen: Bronchial Washing, Right; Respiratory  Result Value Ref Range Status   AFB Specimen Processing Concentration  Final   Acid Fast Smear Negative  Final    Comment: (NOTE) Performed At: [facility] [place], [place] [id number] Rush Farmer MD [id number]    Source (AFB) BRONCHIAL ALVEOLAR LAVAGE  Final    Comment: Performed at [facility], [place] [address] [place], [place] [address]  Anaerobic culture w Gram Stain     Status: None   Collection Time: 11/14/21  2:42 PM   Specimen: Bronchial Washing, Right; Respiratory  Result Value Ref Range Status   Specimen Description   Final    BRONCHIAL ALVEOLAR LAVAGE RUL Performed at [place] [name], [place], [place] [address]    Special Requests   Final    NONE Performed at [facility], [place] [address] [place], [place] [address]    Culture   Final    NO ANAEROBES ISOLATED Performed at [facility], [place] [address] [place], [place] [address]    Report Status 11/20/2021 FINAL  Final  Fungus Culture Result     Status: None   Collection Time: 11/14/21  2:42 PM  Result Value Ref Range Status   Result 1 Comment  Final    Comment: (NOTE) KOH/Calcofluor preparation:  no fungus observed. Performed At: [facility] [place], [place] [id number] Rush Farmer MD  JH:[id number]   Culture, blood (Routine X 2) w Reflex to ID Panel     Status: None   Collection Time: 11/16/21  7:00 PM   Specimen: BLOOD LEFT FOREARM  Result Value Ref Range Status   Specimen Description BLOOD LEFT FOREARM  Final   Special Requests IN PEDIATRIC BOTTLE Blood Culture adequate volume  Final   Culture   Final    NO GROWTH 5 DAYS Performed at [facility], [place] [address], [place], [place] [address]    Report Status  11/21/2021 FINAL  Final  Culture, blood (Routine X 2) w Reflex to ID Panel     Status: None   Collection Time: 11/16/21  7:00 PM   Specimen: BLOOD LEFT WRIST  Result Value Ref Range Status   Specimen Description   Final    BLOOD LEFT WRIST BLOOD Performed at [place] [address] [place], [place] [address]    Special Requests IN PEDIATRIC BOTTLE Blood Culture adequate volume  Final   Culture   Final    NO GROWTH 5 DAYS Performed at [facility], [place] [address], [place], [place] [address]    Report Status 11/21/2021 FINAL  Final  Culture, blood (Routine X 2) w Reflex to ID Panel     Status: None (Preliminary result)   Collection Time: 11/21/21  4:18 PM   Specimen: BLOOD  Result Value Ref Range Status   Specimen Description   Final    BLOOD BLOOD LEFT FOREARM Performed at [place] [address] [place], [place] [address]    Special Requests   Final    BOTTLES DRAWN AEROBIC ONLY Blood Culture results may not be optimal due to an inadequate volume of blood received in culture bottles Performed at [place] [address], [place], [place] [address]    Culture   Final    NO GROWTH 2 DAYS Performed at [place] [address] [place], [place] [address]    Report Status PENDING  Incomplete  Culture, blood (Routine X 2) w Reflex to ID Panel     Status: None (Preliminary result)   Collection Time: 11/21/21  4:18 PM   Specimen: BLOOD  Result Value Ref Range Status   Specimen Description   Final    BLOOD BLOOD LEFT WRIST Performed at [place] [address] [place], [place] [address]    Special Requests   Final    IN PEDIATRIC BOTTLE Blood Culture adequate volume Performed at [place] [address] [place], [place] [address]    Culture   Final    NO GROWTH 2 DAYS Performed at [place] [address], [place], [place] [address]     Report Status PENDING  Incomplete     Radiology Studies: No results found.   Scheduled Meds:  [START ON 11/24/2021] abacavir-dolutegravir-lamiVUDine  1 tablet Oral Daily   azithromycin  500 mg Oral Daily   B-complex with vitamin C  1 tablet Oral Daily   dronabinol  2.5 mg Oral BID AC   ethambutol  800 mg Oral Daily   feeding supplement  237 mL Oral TID BM   ketoconazole   Topical BID   metoprolol succinate  100 mg Oral q morning   mirtazapine  15 mg Oral QHS   mometasone-formoterol  2 puff Inhalation BID   pantoprazole  40 mg Oral QAC breakfast   QUEtiapine  200 mg Oral QHS   rifabutin  300 mg Oral Daily   rivaroxaban  20 mg Oral Q supper   rosuvastatin  5 mg Oral q morning   sodium chloride flush  3 mL Intravenous Q12H   sulfamethoxazole-trimethoprim  1 tablet Oral Daily   umeclidinium bromide  1 puff Inhalation Daily   Continuous Infusions:  piperacillin-tazobactam (ZOSYN)  IV Stopped (11/23/21 1620)     LOS: 13 days   Raiford Noble, DO [facility] Available via Epic secure chat 7am-7pm After these hours, please refer to coverage provider listed on [email or web address] 11/23/2021, 7:59 PM

## 2021-11-23 NOTE — Progress Notes (Signed)
The patient is alert, oriented x4. Pt complaining of generalized aching pain 6/10.   11/23/21 1210  Vitals  Temp 99.6 F (37.6 C)  Temp Source Oral  BP 122/80  MAP (mmHg) 93  BP Location Left Arm  BP Method Automatic  Patient Position (if appropriate) Lying  Pulse Rate (!) 123  Pulse Rate Source Monitor  Resp (!) 32  MEWS COLOR  MEWS Score Color Red  Oxygen Therapy  SpO2 99 %  O2 Device Room Air  MEWS Score  MEWS Temp 0  MEWS Systolic 0  MEWS Pulse 2  MEWS RR 2  MEWS LOC 0  MEWS Score 4

## 2021-11-23 NOTE — Progress Notes (Addendum)
PT Cancellation Note  Patient Details Name: LEWI DROST MRN: [id number] DOB: [birth year]   Cancelled Treatment:    Reason Eval/Treat Not Completed: Fatigue/lethargy limiting ability to participate (Pt asleep, has had a fever today, RN requesting hold at this time.) Will check back as schedule and pt status allows which may be another day.  Jamesetta Geralds, PT, DPT [facility] Office: [phone] Pager: [phone]   Jamesetta Geralds 11/23/2021, 6:31 PM

## 2021-11-24 ENCOUNTER — Ambulatory Visit: Payer: Medicaid Other | Admitting: Physical Therapy

## 2021-11-24 DIAGNOSIS — R627 Adult failure to thrive: Secondary | ICD-10-CM

## 2021-11-24 DIAGNOSIS — R531 Weakness: Secondary | ICD-10-CM | POA: Diagnosis not present

## 2021-11-24 DIAGNOSIS — B20 Human immunodeficiency virus [HIV] disease: Secondary | ICD-10-CM | POA: Diagnosis not present

## 2021-11-24 DIAGNOSIS — R509 Fever, unspecified: Secondary | ICD-10-CM | POA: Diagnosis not present

## 2021-11-24 DIAGNOSIS — Z8673 Personal history of transient ischemic attack (TIA), and cerebral infarction without residual deficits: Secondary | ICD-10-CM | POA: Diagnosis not present

## 2021-11-24 DIAGNOSIS — F332 Major depressive disorder, recurrent severe without psychotic features: Secondary | ICD-10-CM

## 2021-11-24 DIAGNOSIS — R7989 Other specified abnormal findings of blood chemistry: Secondary | ICD-10-CM | POA: Diagnosis not present

## 2021-11-24 DIAGNOSIS — Z7189 Other specified counseling: Secondary | ICD-10-CM | POA: Diagnosis not present

## 2021-11-24 DIAGNOSIS — R6510 Systemic inflammatory response syndrome (SIRS) of non-infectious origin without acute organ dysfunction: Secondary | ICD-10-CM | POA: Diagnosis not present

## 2021-11-24 DIAGNOSIS — Z515 Encounter for palliative care: Secondary | ICD-10-CM | POA: Diagnosis not present

## 2021-11-24 DIAGNOSIS — G8929 Other chronic pain: Secondary | ICD-10-CM

## 2021-11-24 LAB — CBC
HCT: 24.8 % — ABNORMAL LOW (ref 39.0–52.0)
Hemoglobin: 7.5 g/dL — ABNORMAL LOW (ref 13.0–17.0)
MCH: 27.4 pg (ref 26.0–34.0)
MCHC: 30.2 g/dL (ref 30.0–36.0)
MCV: 90.5 fL (ref 80.0–100.0)
Platelets: 369 10*3/uL (ref 150–400)
RBC: 2.74 MIL/uL — ABNORMAL LOW (ref 4.22–5.81)
RDW: 16.6 % — ABNORMAL HIGH (ref 11.5–15.5)
WBC: 4.4 10*3/uL (ref 4.0–10.5)
nRBC: 0 % (ref 0.0–0.2)

## 2021-11-24 LAB — BASIC METABOLIC PANEL
Anion gap: 8 (ref 5–15)
BUN: 14 mg/dL (ref 6–20)
CO2: 18 mmol/L — ABNORMAL LOW (ref 22–32)
Calcium: 9.2 mg/dL (ref 8.9–10.3)
Chloride: 115 mmol/L — ABNORMAL HIGH (ref 98–111)
Creatinine, Ser: 1.05 mg/dL (ref 0.61–1.24)
GFR, Estimated: >60 mL/min (ref >60)
Glucose, Bld: 122 mg/dL — ABNORMAL HIGH (ref 70–99)
Potassium: 3.7 mmol/L (ref 3.5–5.1)
Sodium: 141 mmol/L (ref 135–145)

## 2021-11-24 LAB — MAGNESIUM: Magnesium: 2 mg/dL (ref 1.7–2.4)

## 2021-11-24 MED ORDER — DRONABINOL 2.5 MG PO CAPS
5.0000 mg | ORAL_CAPSULE | Freq: Two times a day (BID) | ORAL | Status: DC
  Administered 2021-11-24 – 2021-11-29 (×11): 5 mg via ORAL
  Filled 2021-11-24 (×11): qty 2

## 2021-11-24 NOTE — Progress Notes (Signed)
PT Cancellation Note  Patient Details Name: Kurt Foley MRN: [id number] DOB: 1975-06-29   Cancelled Treatment:    Reason Eval/Treat Not Completed: (P) Fatigue/lethargy limiting ability to participate;Patient declined, no reason specified (Pt requesting return in the afternoon.) will follow up as schedule and pt status allows.  Jamesetta Geralds, PT, DPT [facility] Office: [phone] Pager: [phone]   Jamesetta Geralds 11/24/2021, 10:39 AM

## 2021-11-24 NOTE — Progress Notes (Signed)
PROGRESS NOTE    Kurt Foley  NUU:[id number] DOB: [birth year] DOA: 11/09/2021 PCP: Gildardo Pounds, NP   Brief Narrative:  46 year old with advanced AIDS, disseminated MAC substance abuse, depression, prior CVA with residual right-sided weakness, pulmonary embolism on Xarelto admitted for cough and palpitations.  Repeat CT showed abnormal lung parenchyma.  He was seen by pulmonary, cultures remain negative.  Underwent bronchoscopy with EBUS on 6/5 with subcarinal lymph node sampling and brushing with BAL of right upper lobe mass.  Brushings remain negative, IR consulted for CT-guided biopsy.  Patient off-and-on spiking fever, discussed with ID and this is likely due to MAC.  Patient has also been seen by palliative care service, wishes to be full code. Unforturnately continues to spike fevers daily leading to tachycardia.   ID is now changing antibiotics and trying IV Zosyn.  Continues to have temperatures and tachycardia along with some back pain but fever seems improved this morning and ID recommends continuing current antibiotics and regimen at this time.  Palliative care is increasing his blood but now and PT OT recommending outpatient PT.   Assessment and Plan:  SIRS with recurrent fevers History of disseminated MAC infection - Seen by ID.  This is likely secondary to disseminated MAC history.  Current regimen-Biktarvy, azithromycin and ethambutol. Now on Rifabutin as well but given his interactions with drugs he was changed to Triumeq -ID feels that he has no real source of his fever but could be secondary to his disseminated MRSA infection -ID is now adding IV Zosyn for empiric treatment of occult infection and monitoring fever and fever seems to have improved today.   Sinus Tachycardia with fevers.  - Continues to spike fevers and off-and-on having tachycardia.  Continue Toprol-XL 100 mg daily.  Continue aggressive IV fluids -Also on IV metoprolol   Right upper lobe  mass -Status post bronchoscopy with EBUS on 6/5.  Brushings remain negative.  Case discussed with IR who recommends outptn PET scan. Spoke with Dr Lamonte Sakai from [place], who also reviewed images and recommended outptn PET followed by follow up with Dr Valeta Harms for discussion of possible Navigation Bronch. There may be a component of infection which may clear with time?  -On IV Zosyn now we will continue for now   AKI Metabolic acidosis - AKI has resolved resolved -Patient's BUNs/creatinine is now 20/0.90 yesterday and today it is 14/1.05 and slightly trending upwards -Patient's metabolic acidosis is improving slightly and he has a CO2 of 18 now, anion gap of 8, chloride level 150 -Avoid nephrotoxic medications, contrast dyes, hypotension and adjust medications   Pulmonary embolus -Transitioned back to Xarelto.   History of CVA -Residual right-sided weakness.  On statin  Emphysema -Bronchodilators to be continued.  HIV with history of MAI. -Infectious diseases following.  currently on Biktarvy.  On azithromycin daily as well and now going on IV Zosyn  Substance use -Counseled to quit using.  Depression -Continue with on Seroquel and Remeron   Normocytic Anemia -Patient's hemoglobin/hematocrit is now 7.7/26.9 yesterday and now today is 7.5/24.8 -Trended down from 9.6/31.4 on 6 8 -Check anemia panel in a.m. -Continue monitor for signs of a bleeding; no overt bleeding noted -Repeat CBC in the a.m.  GERD -Daily PPI   Transaminitis -Trend LFTs -LFTs have been slightly abnormal and AST is now 56 ALT is now 46 on last check and not repeated this morning for unclear reasons Continue monitor and if necessary will obtain a right upper quadrant ultrasound   Goals  of care Severe protein calorie malnutrition -Advanced HIV with very guarded prognosis.  -Palliative care following-he wishes to be full code -Supplements.  Marinol added and dose increased today given that he has extremely poor  p.o. intake  DVT prophylaxis:  rivaroxaban (XARELTO) tablet 20 mg    Code Status: Full Code Family Communication: No family currently at bedside  Disposition Plan:  Level of care: Progressive Status is: Inpatient Remains inpatient appropriate because: Continues to have tachycardia and being on significant treatment delineated by ID   Consultants:  Infectious diseases Palliative care medicine Pulmonary Interventional radiology  Procedures:  Procedure(s): Flexible bronchoscopy (56256) Brushing (38937) of the RUL  Bronchial alveolar lavage (34287) of the RUL Endobronchial ultrasound (68115) Transbronchial needle aspiration (72620) of the station 7   Antimicrobials:  Anti-infectives (From admission, onward)    Start     Dose/Rate Route Frequency Ordered Stop   11/24/21 1000  abacavir-dolutegravir-lamiVUDine (TRIUMEQ) 600-50-300 MG per tablet 1 tablet        1 tablet Oral Daily 11/23/21 1107     11/23/21 1130  piperacillin-tazobactam (ZOSYN) IVPB 3.375 g        3.375 g 12.5 mL/hr over 240 Minutes Intravenous Every 8 hours 11/23/21 1042     11/20/21 1500  rifabutin (MYCOBUTIN) capsule 300 mg        300 mg Oral Daily 11/20/21 1341     11/20/21 1430  sulfamethoxazole-trimethoprim (BACTRIM DS) 800-160 MG per tablet 1 tablet        1 tablet Oral Daily 11/20/21 1342     11/17/21 0800  vancomycin (VANCOCIN) IVPB 1000 mg/200 mL premix  Status:  Discontinued        1,000 mg 200 mL/hr over 60 Minutes Intravenous Every 12 hours 11/16/21 1807 11/17/21 1048   11/16/21 1830  vancomycin (VANCOREADY) IVPB 1500 mg/300 mL        1,500 mg 150 mL/hr over 120 Minutes Intravenous STAT 11/16/21 1758 11/16/21 2048   11/16/21 1800  ceFEPIme (MAXIPIME) 2 g in sodium chloride 0.9 % 100 mL IVPB  Status:  Discontinued        2 g 200 mL/hr over 30 Minutes Intravenous Every 8 hours 11/16/21 1756 11/17/21 1048   11/11/21 2200  azithromycin (ZITHROMAX) tablet 500 mg        500 mg Oral Daily 11/11/21 1511      11/10/21 2200  azithromycin (ZITHROMAX) 500 mg in sodium chloride 0.9 % 250 mL IVPB  Status:  Discontinued        500 mg 250 mL/hr over 60 Minutes Intravenous Every 24 hours 11/10/21 1316 11/11/21 1511   11/10/21 1400  cefTRIAXone (ROCEPHIN) 2 g in sodium chloride 0.9 % 100 mL IVPB  Status:  Discontinued        2 g 200 mL/hr over 30 Minutes Intravenous Every 24 hours 11/10/21 1316 11/11/21 1511   11/10/21 1000  ethambutol (MYAMBUTOL) tablet 800 mg        800 mg Oral Daily 11/09/21 1840     11/10/21 1000  sulfamethoxazole-trimethoprim (BACTRIM DS) 800-160 MG per tablet 1 tablet  Status:  Discontinued        1 tablet Oral Daily 11/09/21 1840 11/10/21 1316   11/09/21 2200  azithromycin (ZITHROMAX) tablet 500 mg  Status:  Discontinued        500 mg Oral Daily at bedtime 11/09/21 1909 11/10/21 1316   11/09/21 1830  vancomycin (VANCOREADY) IVPB 1500 mg/300 mL        1,500 mg 150  mL/hr over 120 Minutes Intravenous STAT 11/09/21 1817 11/09/21 2157   11/09/21 1830  piperacillin-tazobactam (ZOSYN) IVPB 3.375 g        3.375 g 100 mL/hr over 30 Minutes Intravenous STAT 11/09/21 1817 11/09/21 1932   11/09/21 1700  bictegravir-emtricitabine-tenofovir AF (BIKTARVY) 50-200-25 MG per tablet 1 tablet  Status:  Discontinued        1 tablet Oral Daily 11/09/21 1608 11/23/21 1107        Subjective: Seen and examined and states that he feels a little bit better today than he did yesterday.  States he also slept a little bit better.  No nausea or vomiting.  Is feeling cold still.  Denies any chest pain.  No other concerns or complaints at this time.  Objective: Vitals:   11/24/21 0900 11/24/21 0954 11/24/21 1144 11/24/21 1620  BP:  105/72 140/90 94/73  Pulse:  (!) 102 (!) 110 93  Resp:  20 (!) 32 (!) 28  Temp:  98.7 F (37.1 C) 99 F (37.2 C) 98.5 F (36.9 C)  TempSrc:   Oral Oral  SpO2: 96% 99% 98% 97%  Weight:      Height:        Intake/Output Summary (Last 24 hours) at 11/24/2021 1807 Last  data filed at 11/24/2021 1147 Gross per 24 hour  Intake 400.04 ml  Output 25 ml  Net 375.04 ml   Filed Weights   11/09/21 2040 11/18/21 0430  Weight: 67.9 kg 69.2 kg    Examination: Physical Exam:  Constitutional: Thin African-American male currently no acute distress appears slightly uncomfortable but is resting now and continues to be a little bit tachycardic Respiratory: Diminished to auscultation bilaterally with coarse breath sounds, no wheezing, rales, rhonchi or crackles. Normal respiratory effort and patient is not tachypenic. No accessory muscle use.  Unlabored breathing Cardiovascular: RRR, no murmurs / rubs / gallops. S1 and S2 auscultated.  No appreciable extremity edema Abdomen: Soft, non-tender, nondistended.  Bowel sounds positive.  GU: Deferred. Musculoskeletal: No clubbing / cyanosis of digits/nails. No joint deformity upper and lower extremities.  Neurologic: CN 2-12 grossly intact with no focal deficits. Romberg sign and cerebellar reflexes not assessed.  Psychiatric: Normal judgment and insight. Alert and oriented x 3. Normal mood and appropriate affect.   Data Reviewed: I have personally reviewed following labs and imaging studies  CBC: Recent Labs  Lab 11/19/21 1143 11/20/21 0531 11/21/21 0431 11/23/21 1029 11/24/21 0455  WBC 3.8* 4.0 4.9 6.1 4.4  NEUTROABS  --   --   --  4.1  --   HGB 9.0* 8.3* 8.7* 7.7* 7.5*  HCT 29.2* 27.8* 29.0* 26.9* 24.8*  MCV 89.6 92.4 92.7 95.4 90.5  PLT 328 345 341 301 262   Basic Metabolic Panel: Recent Labs  Lab 11/19/21 1143 11/20/21 0531 11/21/21 0431 11/23/21 1029 11/24/21 0455  NA 137 138 137 136 141  K 4.3 3.9 4.2 4.2 3.7  CL 107 109 109 112* 115*  CO2 19* 19* 19* 15* 18*  GLUCOSE 104* 109* 89 82 122*  BUN _0 CREATININE 0.85 0.71 0.89 0.90 1.05  CALCIUM 9.9 9.1 9.4 8.9 9.2  MG 1.7 1.8 1.8 1.8 2.0  PHOS  --   --   --  4.2  --    GFR: Estimated Creatinine Clearance: 78.5 mL/min (by C-G formula  based on SCr of 1.05 mg/dL). Liver Function Tests: Recent Labs  Lab 11/18/21 0506 11/19/21 1143 11/20/21 0531 11/21/21 0431 11/23/21  1029  AST 47* 41 47* 54* 56*  ALT 74* 57* 53* 54* 46*  ALKPHOS 229* 238* 218* 230* 259*  BILITOT 0.7 0.6 0.5 0.7 0.8  PROT 7.9 7.9 7.5 7.3 6.6  ALBUMIN 3.2* 2.9* 2.7* 2.8* 2.6*   No results for input(s): "LIPASE", "AMYLASE" in the last 168 hours. No results for input(s): "AMMONIA" in the last 168 hours. Coagulation Profile: No results for input(s): "INR", "PROTIME" in the last 168 hours. Cardiac Enzymes: No results for input(s): "CKTOTAL", "CKMB", "CKMBINDEX", "TROPONINI" in the last 168 hours. BNP (last 3 results) No results for input(s): "PROBNP" in the last 8760 hours. HbA1C: No results for input(s): "HGBA1C" in the last 72 hours. CBG: Recent Labs  Lab 11/23/21 0716  GLUCAP 115*   Lipid Profile: No results for input(s): "CHOL", "HDL", "LDLCALC", "TRIG", "CHOLHDL", "LDLDIRECT" in the last 72 hours. Thyroid Function Tests: No results for input(s): "TSH", "T4TOTAL", "FREET4", "T3FREE", "THYROIDAB" in the last 72 hours. Anemia Panel: No results for input(s): "VITAMINB12", "FOLATE", "FERRITIN", "TIBC", "IRON", "RETICCTPCT" in the last 72 hours. Sepsis Labs: No results for input(s): "PROCALCITON", "LATICACIDVEN" in the last 168 hours.  Recent Results (from the past 240 hour(s))  Culture, blood (Routine X 2) w Reflex to ID Panel     Status: None   Collection Time: 11/16/21  7:00 PM   Specimen: BLOOD LEFT FOREARM  Result Value Ref Range Status   Specimen Description BLOOD LEFT FOREARM  Final   Special Requests IN PEDIATRIC BOTTLE Blood Culture adequate volume  Final   Culture   Final    NO GROWTH 5 DAYS Performed at [facility], [address] [place], [place] [address]    Report Status 11/21/2021 FINAL  Final  Culture, blood (Routine X 2) w Reflex to ID Panel     Status: None   Collection Time: 11/16/21  7:00 PM   Specimen:  BLOOD LEFT WRIST  Result Value Ref Range Status   Specimen Description   Final    BLOOD LEFT WRIST BLOOD Performed at [facility] [address] [place], [place] [address]    Special Requests IN PEDIATRIC BOTTLE Blood Culture adequate volume  Final   Culture   Final    NO GROWTH 5 DAYS Performed at [facility], [place] [address], [place], [place] [address]    Report Status 11/21/2021 FINAL  Final  Culture, blood (Routine X 2) w Reflex to ID Panel     Status: None (Preliminary result)   Collection Time: 11/21/21  4:18 PM   Specimen: BLOOD  Result Value Ref Range Status   Specimen Description   Final    BLOOD BLOOD LEFT FOREARM Performed at [facility] [address] [place], [place] [address]    Special Requests   Final    BOTTLES DRAWN AEROBIC ONLY Blood Culture results may not be optimal due to an inadequate volume of blood received in culture bottles Performed at [facility] [address] [place], [place] [address]    Culture   Final    NO GROWTH 3 DAYS Performed at [facility], [place] [address], [place], [place] [address]    Report Status PENDING  Incomplete  Culture, blood (Routine X 2) w Reflex to ID Panel     Status: None (Preliminary result)   Collection Time: 11/21/21  4:18 PM   Specimen: BLOOD  Result Value Ref Range Status   Specimen Description   Final  BLOOD BLOOD LEFT WRIST Performed at [facility] [address], [place], [place] [address]    Special Requests   Final    IN PEDIATRIC BOTTLE Blood Culture adequate volume Performed at [facility] [address], [place], [place] [address]    Culture   Final    NO GROWTH 3 DAYS Performed at [facility], [place] [address] [place], [place] [address]    Report Status PENDING  Incomplete     Radiology Studies: No results found.   Scheduled Meds:  abacavir-dolutegravir-lamiVUDine  1  tablet Oral Daily   azithromycin  500 mg Oral Daily   B-complex with vitamin C  1 tablet Oral Daily   dronabinol  5 mg Oral BID AC   ethambutol  800 mg Oral Daily   feeding supplement  237 mL Oral TID BM   ketoconazole   Topical BID   metoprolol succinate  100 mg Oral q morning   mirtazapine  15 mg Oral QHS   mometasone-formoterol  2 puff Inhalation BID   pantoprazole  40 mg Oral QAC breakfast   QUEtiapine  200 mg Oral QHS   rifabutin  300 mg Oral Daily   rivaroxaban  20 mg Oral Q supper   rosuvastatin  5 mg Oral q morning   sodium chloride flush  3 mL Intravenous Q12H   sulfamethoxazole-trimethoprim  1 tablet Oral Daily   umeclidinium bromide  1 puff Inhalation Daily   Continuous Infusions:  piperacillin-tazobactam (ZOSYN)  IV 3.375 g (11/24/21 1318)    LOS: 14 days   Raiford Noble, DO [facility] Available via Epic secure chat 7am-7pm After these hours, please refer to coverage provider listed on [email or web address] 11/24/2021, 6:07 PM

## 2021-11-24 NOTE — Progress Notes (Signed)
Physical Therapy Treatment Patient Details Name: Kurt Foley MRN: [id number] DOB: [birth year] Today's Date: 11/24/2021   History of Present Illness 46 yo male admitted with SIRS. [name] hx of PE/DVT. 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    PT Comments    Pt sitting in recliner and agreeable to be seen. Donned shoes and R AFO for ambulation, pt required assist to complete donning of shoes. Supervision for transfers, min guard for ambulation in hallway with hemi-walker, recliner follow for safety, ~39ft, two seated rest breaks with VSS throughout. Pt limited by fatigue but expressing desire to do more. Pt completed short range of motion exercises for LUE and LLE, encouraged pt to complete with every commercial break to reduce stiffness and maintain ROM. Pt would benefit from additional ambulation outside of therapy sessions if possible. We will continue to follow the pt acutely.    Recommendations for follow up therapy are one component of a multi-disciplinary discharge planning process, led by the attending physician.  Recommendations may be updated based on patient status, additional functional criteria and insurance authorization.  Follow Up Recommendations  Outpatient PT     Assistance Recommended at Discharge PRN  Patient can return home with the following A little help with walking and/or transfers;Help with stairs or ramp for entrance;A little help with bathing/dressing/bathroom;Assistance with cooking/housework   Equipment Recommendations  None recommended by PT    Recommendations for Other Services       Precautions / Restrictions Precautions Precautions: Fall Precaution Comments: R residual weakness UE/LE; R foot AFO Restrictions Weight Bearing Restrictions: No     Mobility  Bed Mobility Overal bed mobility: Needs Assistance Bed Mobility: Sit to Supine       Sit to supine: Min assist   General bed  mobility comments: Pt required min assist to bring BLE up into bed.    Transfers Overall transfer level: Needs assistance Equipment used: Hemi-walker Transfers: Sit to/from Stand Sit to Stand: Supervision           General transfer comment: Supv for safety, lines    Ambulation/Gait Ambulation/Gait assistance: Min guard Gait Distance (Feet): 80 Feet Assistive device: Hemi-walker (AFO) Gait Pattern/deviations: Decreased stride length, Step-to pattern, Decreased weight shift to right Gait velocity: decreased     General Gait Details: Min guard for ambulation with hemi-walker in hallway, 59ft, recliner follow for safety .No LOB with hemiwalker use. Good clearance of R foot with AFO use. Slow but steady gait. Pt took two seated rest breaks. VSS throughout.   Stairs             Wheelchair Mobility    Modified Rankin (Stroke Patients Only)       Balance Overall balance assessment: Mild deficits observed, not formally tested                                          Cognition Arousal/Alertness: Awake/alert Behavior During Therapy: WFL for tasks assessed/performed Overall Cognitive Status: Within Functional Limits for tasks assessed                                          Exercises Other Exercises Other Exercises: Left shoulder/elbow/wrist AROM, mostly, flexion extension, x5. Encouraged AAROM of RUE, pt declined  to perform. Other Exercises: LLE heel slides, ankle pumps. Encouraged use of RLE as able.    General Comments        Pertinent Vitals/Pain Pain Assessment Pain Assessment: No/denies pain    Home Living                          Prior Function            PT Goals (current goals can now be found in the care plan section) Acute Rehab PT Goals Patient Stated Goal: to be able to mobilize more, return to outpatient PT PT Goal Formulation: With patient Time For Goal Achievement: 11/26/21 Potential to  Achieve Goals: Good Progress towards PT goals: Progressing toward goals    Frequency    Min 3X/week      PT Plan Current plan remains appropriate    Co-evaluation              AM-PAC PT "6 Clicks" Mobility   Outcome Measure  Help needed turning from your back to your side while in a flat bed without using bedrails?: None Help needed moving from lying on your back to sitting on the side of a flat bed without using bedrails?: A Little Help needed moving to and from a bed to a chair (including a wheelchair)?: A Little Help needed standing up from a chair using your arms (e.g., wheelchair or bedside chair)?: A Little Help needed to walk in hospital room?: A Little Help needed climbing 3-5 steps with a railing? : A Lot 6 Click Score: 18    End of Session Equipment Utilized During Treatment: Gait belt Activity Tolerance: Patient limited by fatigue Patient left: with call bell/phone within reach;in bed;with bed alarm set Nurse Communication: Mobility status PT Visit Diagnosis: Difficulty in walking, not elsewhere classified (R26.2);Other symptoms and signs involving the nervous system (Z61.096)     Time: 0454-0981 PT Time Calculation (min) (ACUTE ONLY): 32 min  Charges:  $Gait Training: 23-37 mins                     Jamesetta Geralds, PT, DPT [facility] Department Office: [phone] Pager: [phone]   Jamesetta Geralds 11/24/2021, 2:34 PM

## 2021-11-24 NOTE — Progress Notes (Addendum)
    [facility]   Reason for visit: Follow up on HIV, MAI, fever  Interval History: fever trending down  Physical Exam: Constitutional:  Vitals:   11/24/21 0954 11/24/21 1144  BP: 105/72 140/90  Pulse: (!) 102 (!) 110  Resp: 20 (!) 32  Temp: 98.7 F (37.1 C) 99 F (37.2 C)  SpO2: 99% 98%   patient appears in NAD  Impression: fever and multiple issues.  No fever this am.    Plan: 1.  No changes, continue antibiotics; seems improved empirically with addition of piperacillin/tazobactam. Will continue to monitor.

## 2021-11-24 NOTE — Progress Notes (Signed)
Daily Progress Note   Patient Name: Kurt Foley       Date: 11/24/2021 DOB: [birth year]  Age: 46 y.o. MRN#: [id number] Attending Physician: Kerney Elbe, DO Primary Care Physician: Gildardo Pounds, NP Admit Date: 11/09/2021  Reason for Consultation/Follow-up: Establishing goals of care  Subjective: Reports poor appetite despite Marinol Pain well controlled with current interventions  Length of Stay: 14  Current Medications: Scheduled Meds:   abacavir-dolutegravir-lamiVUDine  1 tablet Oral Daily   azithromycin  500 mg Oral Daily   B-complex with vitamin C  1 tablet Oral Daily   dronabinol  5 mg Oral BID AC   ethambutol  800 mg Oral Daily   feeding supplement  237 mL Oral TID BM   ketoconazole   Topical BID   metoprolol succinate  100 mg Oral q morning   mirtazapine  15 mg Oral QHS   mometasone-formoterol  2 puff Inhalation BID   pantoprazole  40 mg Oral QAC breakfast   QUEtiapine  200 mg Oral QHS   rifabutin  300 mg Oral Daily   rivaroxaban  20 mg Oral Q supper   rosuvastatin  5 mg Oral q morning   sodium chloride flush  3 mL Intravenous Q12H   sulfamethoxazole-trimethoprim  1 tablet Oral Daily   umeclidinium bromide  1 puff Inhalation Daily    Continuous Infusions:  piperacillin-tazobactam (ZOSYN)  IV 3.375 g (11/24/21 1318)    PRN Meds: acetaminophen (TYLENOL) oral liquid 160 mg/5 mL, acetaminophen **OR** acetaminophen, chlorpheniramine-HYDROcodone, guaiFENesin-dextromethorphan, hydrALAZINE, ibuprofen, ipratropium-albuterol, metoprolol tartrate, morphine, morphine injection, ondansetron (ZOFRAN) IV, polyethylene glycol, senna-docusate  Physical Exam Constitutional:      General: He is not in acute distress.    Appearance: He is ill-appearing.  Pulmonary:     Effort:  Pulmonary effort is normal.  Skin:    General: Skin is warm and dry.  Neurological:     Mental Status: He is alert and oriented to person, place, and time.             Vital Signs: BP 94/73 (BP Location: Left Arm)   Pulse 93   Temp 98.5 F (36.9 C) (Oral)   Resp (!) 28   Ht _0  (1.676 m)   Wt 69.2 kg   SpO2 97%   BMI 24.62 kg/m  SpO2: SpO2: 97 % O2 Device: O2 Device: Room Air O2 Flow Rate: O2 Flow Rate (L/min): 2 L/min  Intake/output summary:  Intake/Output Summary (Last 24 hours) at 11/24/2021 1641 Last data filed at 11/24/2021 1147 Gross per 24 hour  Intake 520.04 ml  Output 475 ml  Net 45.04 ml   LBM: Last BM Date : 11/24/21 Baseline Weight: Weight: 67.9 kg Most recent weight: Weight: 69.2 kg       Palliative Assessment/Data: PPS 40%      Patient Active Problem List   Diagnosis Date Noted   Goals of care, counseling/discussion    General weakness    Encounter for palliative care    Mediastinal lymphadenopathy    Pneumonia 11/10/2021   SIRS (systemic inflammatory response syndrome) (HCC) 11/09/2021   Pulmonary embolism ([place]) 10/29/2021   Tinea corporis and facialis 10/07/2021   Acute respiratory infection  Tinea barbae    Dyspnea 10/06/2021   Mass of upper lobe of right lung 10/06/2021   Cough 09/14/2021   Insomnia 08/08/2021   Pancytopenia ([place]) 08/08/2021   Abnormal LFTs 08/08/2021   Neutropenia ([place]) 08/08/2021   Sinus tachycardia    Encephalitis 07/22/2021   Debility 07/20/2021   Hypokalemia 07/19/2021   Hyponatremia 07/18/2021   Malnutrition of moderate degree 07/15/2021   FTT (failure to thrive) in adult 07/13/2021   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   CVA (cerebral vascular accident) ([employer]) 07/08/2021   History of CVA with residual right hemiparesis 05/25/2021   Rash of mouth present on examination 03/25/2021   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[place]) 03/25/2021   Need for prophylactic  vaccination and inoculation against smallpox 03/25/2021   Numbness and tingling of right arm 03/24/2021   AIDS (acquired immune deficiency syndrome) ([place]) 03/05/2021   Emphysema, unspecified ([place]) 03/05/2021   Cocaine-induced mood disorder ([place])    Tinea pedis 08/06/2019   At risk for opportunistic infections 07/14/2019   AKI (acute kidney injury) ([place]) 06/02/2019   Generalized weakness 06/02/2019   Cocaine abuse ([place]) 11/25/2018   Major depressive disorder, 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/2018   Substance induced mood disorder ([place]) 09/16/2015   Cocaine use disorder, severe, dependence ([place])    Cannabis use disorder, severe, dependence ([place]) 04/09/2015   Severe recurrent major depression without psychotic features ([place]) 04/08/2015   Alcohol abuse 04/08/2015   Screening examination for venereal disease 03/25/2015   Encounter for long-term (current) use of medications 03/25/2015   Tobacco abuse    Visual disturbance 11/18/2013   Human immunodeficiency virus (HIV) disease ([place]) 10/30/2013   Atopic dermatitis 10/30/2013    Palliative Care Assessment & Plan   HPI: 46 year old gentleman with advanced AIDS, disseminated MAC, substance use, depression prior stroke with residual right-sided weakness, history of pulmonary embolism, admitted with cough and palpitations.  CT scan concerning for possible lung mass, underwent bronchoscopy, infectious disease following.  Has right upper lobe mass.  Bronchoscopy brushings negative, consideration was being given to pursuing biopsy however plan is to proceed with outpatient PET scan.  Hospital course complicated by SIRS, patient off-and-on spiking fevers.  Remains on azithromycin. Palliative medicine team following for CODE STATUS and broad goals of care discussions  Assessment: Follow-up today with patient.  Goals remain the same.  He does report ongoing poor appetite.  We discussed increasing Marinol and he is agreeable.  We  also discussed sipping on nutritional supplements throughout the day for additional nutrients and calories.  He tells me pain medications are working though he is still having some chronic pain off-and-on.  We discussed heating pad and he is agreeable to this.  All questions and concerns answered.  Recommendations/Plan: Increase marinol to 5 mg BID K pad Full code full scope  Goals of Care and Additional Recommendations: Limitations on Scope of Treatment: Full Scope Treatment  Code Status: Full code  Discharge Planning: To Be Determined  Care plan was discussed with patient, RN, family member at bedside  Thank you for allowing the Palliative Medicine Team to assist in the care of this patient.  *Please note that this is a verbal dictation therefore any spelling or grammatical errors are due to the "[employer]" system interpretation.  Juel Burrow, DNP, [facility] Palliative Medicine Team Team Phone # [phone]  Pager [phone]

## 2021-11-25 ENCOUNTER — Inpatient Hospital Stay (HOSPITAL_COMMUNITY): Payer: Medicaid Other

## 2021-11-25 DIAGNOSIS — Z7189 Other specified counseling: Secondary | ICD-10-CM | POA: Diagnosis not present

## 2021-11-25 DIAGNOSIS — B20 Human immunodeficiency virus [HIV] disease: Secondary | ICD-10-CM | POA: Diagnosis not present

## 2021-11-25 DIAGNOSIS — R7989 Other specified abnormal findings of blood chemistry: Secondary | ICD-10-CM | POA: Diagnosis not present

## 2021-11-25 DIAGNOSIS — Z8673 Personal history of transient ischemic attack (TIA), and cerebral infarction without residual deficits: Secondary | ICD-10-CM | POA: Diagnosis not present

## 2021-11-25 DIAGNOSIS — Z515 Encounter for palliative care: Secondary | ICD-10-CM | POA: Diagnosis not present

## 2021-11-25 DIAGNOSIS — R6510 Systemic inflammatory response syndrome (SIRS) of non-infectious origin without acute organ dysfunction: Secondary | ICD-10-CM | POA: Diagnosis not present

## 2021-11-25 DIAGNOSIS — R531 Weakness: Secondary | ICD-10-CM | POA: Diagnosis not present

## 2021-11-25 DIAGNOSIS — R509 Fever, unspecified: Secondary | ICD-10-CM | POA: Diagnosis not present

## 2021-11-25 LAB — COMPREHENSIVE METABOLIC PANEL
ALT: 41 U/L (ref 0–44)
AST: 45 U/L — ABNORMAL HIGH (ref 15–41)
Albumin: 2.6 g/dL — ABNORMAL LOW (ref 3.5–5.0)
Alkaline Phosphatase: 311 U/L — ABNORMAL HIGH (ref 38–126)
Anion gap: 11 (ref 5–15)
BUN: 9 mg/dL (ref 6–20)
CO2: 20 mmol/L — ABNORMAL LOW (ref 22–32)
Calcium: 9.3 mg/dL (ref 8.9–10.3)
Chloride: 108 mmol/L (ref 98–111)
Creatinine, Ser: 0.96 mg/dL (ref 0.61–1.24)
GFR, Estimated: >60 mL/min (ref >60)
Glucose, Bld: 88 mg/dL (ref 70–99)
Potassium: 4.4 mmol/L (ref 3.5–5.1)
Sodium: 139 mmol/L (ref 135–145)
Total Bilirubin: 0.6 mg/dL (ref 0.3–1.2)
Total Protein: 7.4 g/dL (ref 6.5–8.1)

## 2021-11-25 LAB — PHOSPHORUS: Phosphorus: 5 mg/dL — ABNORMAL HIGH (ref 2.5–4.6)

## 2021-11-25 LAB — CBC WITH DIFFERENTIAL/PLATELET
Abs Immature Granulocytes: 0.47 10*3/uL — ABNORMAL HIGH (ref 0.00–0.07)
Basophils Absolute: 0 10*3/uL (ref 0.0–0.1)
Basophils Relative: 0 %
Eosinophils Absolute: 0 10*3/uL (ref 0.0–0.5)
Eosinophils Relative: 0 %
HCT: 27.5 % — ABNORMAL LOW (ref 39.0–52.0)
Hemoglobin: 8.1 g/dL — ABNORMAL LOW (ref 13.0–17.0)
Immature Granulocytes: 7 %
Lymphocytes Relative: 12 %
Lymphs Abs: 0.8 10*3/uL (ref 0.7–4.0)
MCH: 27.1 pg (ref 26.0–34.0)
MCHC: 29.5 g/dL — ABNORMAL LOW (ref 30.0–36.0)
MCV: 92 fL (ref 80.0–100.0)
Monocytes Absolute: 0.7 10*3/uL (ref 0.1–1.0)
Monocytes Relative: 10 %
Neutro Abs: 4.8 10*3/uL (ref 1.7–7.7)
Neutrophils Relative %: 71 %
Platelets: 422 10*3/uL — ABNORMAL HIGH (ref 150–400)
RBC: 2.99 MIL/uL — ABNORMAL LOW (ref 4.22–5.81)
RDW: 16.7 % — ABNORMAL HIGH (ref 11.5–15.5)
WBC: 6.8 10*3/uL (ref 4.0–10.5)
nRBC: 0 % (ref 0.0–0.2)

## 2021-11-25 LAB — GLUCOSE, CAPILLARY: Glucose-Capillary: 106 mg/dL — ABNORMAL HIGH (ref 70–99)

## 2021-11-25 LAB — MAGNESIUM: Magnesium: 1.9 mg/dL (ref 1.7–2.4)

## 2021-11-25 MED ORDER — IOHEXOL 9 MG/ML PO SOLN
500.0000 mL | ORAL | Status: AC
  Administered 2021-11-25 (×2): 500 mL via ORAL

## 2021-11-25 MED ORDER — MORPHINE SULFATE (PF) 2 MG/ML IV SOLN
1.0000 mg | INTRAVENOUS | Status: DC | PRN
  Administered 2021-11-25 – 2021-11-26 (×2): 2 mg via INTRAVENOUS
  Filled 2021-11-25 (×2): qty 1

## 2021-11-25 MED ORDER — MORPHINE SULFATE 15 MG PO TABS
7.5000 mg | ORAL_TABLET | ORAL | Status: DC | PRN
  Administered 2021-11-25 – 2021-11-26 (×3): 7.5 mg via ORAL
  Filled 2021-11-25 (×3): qty 1

## 2021-11-25 NOTE — Progress Notes (Signed)
PROGRESS NOTE    Kurt Foley  ZOX:[id number] DOB: 1975-10-11 DOA: 11/09/2021 PCP: Gildardo Pounds, NP   Brief Narrative:  46 year old with advanced AIDS, disseminated MAC substance abuse, depression, prior CVA with residual right-sided weakness, pulmonary embolism on Xarelto admitted for cough and palpitations.  Repeat CT showed abnormal lung parenchyma.  He was seen by pulmonary, cultures remain negative.  Underwent bronchoscopy with EBUS on 6/5 with subcarinal lymph node sampling and brushing with BAL of right upper lobe mass.  Brushings remain negative, IR consulted for CT-guided biopsy.  Patient off-and-on spiking fever, discussed with ID and this is likely due to MAC.  Patient has also been seen by palliative care service, wishes to be full code. Unforturnately continues to spike fevers daily leading to tachycardia.   ID is now changing antibiotics and trying IV Zosyn.  Continues to have temperatures and tachycardia along with some back pain but fever seems improved this morning and ID recommends continuing current antibiotics and regimen at this time.  Palliative care is increasing his blood but now and PT OT recommending outpatient PT.   Given his recurrent fevers ID is now checking a CT of the chest abdomen pelvis to see if there is anything new that they could consider; otherwise that they are going to continue his current plan however they have changed his HIV medication from [place] to [place].   Assessment and Plan:  SIRS with recurrent fevers History of disseminated MAC infection - Seen by ID.  This is likely secondary to disseminated MAC history.  Current regimen-Biktarvy, azithromycin and ethambutol. Now on Rifabutin as well but given his interactions with drugs he was changed to Triumeq -ID feels that he has no real source of his fever but could be secondary to his disseminated MRSA infection -ID is now adding IV Zosyn for empiric treatment of occult infection and  monitoring fever and fever continues to recur and continues to have high temperatures even though he had a period where he is fever free but likely in the anti-inflammatory effects of antibiotics -ID is now ordering a CT of the chest abdomen abdomen and pelvis to look for anything new that they can consider to treat including new atypical pneumonia, occult abscess or more lymphadenopathy   Sinus Tachycardia with fevers.  - Continues to spike fevers and off-and-on having tachycardia.  Continue Toprol-XL 100 mg daily.  Continue aggressive IV fluids -Also on IV metoprolol   Right upper lobe mass Dyspnea with shortness of breath  -Status post bronchoscopy with EBUS on 6/5.  Brushings remain negative.  Case discussed with IR who recommends outptn PET scan. Spoke with Dr Lamonte Sakai from [facility], who also reviewed images and recommended outptn PET followed by follow up with Dr Valeta Harms for discussion of possible Navigation Bronch. There may be a component of infection which may clear with time?  -Getting a CT of the chest abdomen pelvis today -On IV Zosyn now we will continue for now   AKI Metabolic acidosis - AKI has resolved resolved -Patient's BUNs/creatinine is now 20/0.90 yesterday and today it is 14/1.05 and slightly trending upwards -Patient's metabolic acidosis is improving slightly and he has a CO2 of 20 with a -Avoid nephrotoxic medications, contrast dyes, hypotension and adjust medications   Pulmonary embolus Has a history of DVT -Transitioned back to Xarelto.   History of CVA -Residual right-sided weakness.  On statin  Emphysema -Bronchodilators to be continued.  HIV with history of MAI. -Infectious diseases following.  currently on Triumeq  and ethambutol and rifabutin. On azithromycin daily as well and now going on IV Zosyn -He is on 3 drugs and they will continue and they feel it is difficult with his underlying poor immune recovery  Substance use -Counseled to quit  using.  Depression -Continue with on Seroquel and Remeron   Normocytic Anemia -Patient's hemoglobin/hematocrit is now 7.7/26.9 the day before yesterday and now yesterday is 7.5/24.8 and is now 8.1/27.5 -Trended down from 9.6/31.4 on 6/8 -Check anemia panel in a.m. -Continue monitor for signs of a bleeding; no overt bleeding noted -Repeat CBC in the a.m.  GERD -Daily PPI   Transaminitis -Trend LFTs -LFTs have been slightly abnormal but have now been improving and AST is now 45 and ALT 41 -Continue monitor and if necessary will obtain a right upper quadrant ultrasound   Goals of care Severe protein calorie malnutrition -Advanced HIV with very guarded prognosis.  -Palliative care following-he wishes to be full code -Supplements.  Marinol added and dose increased today given that he has extremely poor p.o. intake -Marinol has been increased to 5 mg twice daily  DVT prophylaxis:  rivaroxaban (XARELTO) tablet 20 mg    Code Status: Full Code Family Communication: No family currently at bedside  Disposition Plan:  Level of care: Progressive Status is: Inpatient Remains inpatient appropriate because: Needs to have fevers under control and ID clearance prior to safe discharge disposition   Consultants:  Infectious diseases Palliative care medicine Pulmonary Interventional radiology  Procedures:  Procedure(s): Flexible bronchoscopy (63845) Brushing (36468) of the RUL  Bronchial alveolar lavage (03212) of the RUL Endobronchial ultrasound (24825) Transbronchial needle aspiration (00370) of the station 7   He will be undergoing a CT of the chest abdomen pelvis today ordered by ID  Antimicrobials:  Anti-infectives (From admission, onward)    Start     Dose/Rate Route Frequency Ordered Stop   11/24/21 1000  abacavir-dolutegravir-lamiVUDine (TRIUMEQ) 600-50-300 MG per tablet 1 tablet        1 tablet Oral Daily 11/23/21 1107     11/23/21 1130  piperacillin-tazobactam (ZOSYN)  IVPB 3.375 g        3.375 g 12.5 mL/hr over 240 Minutes Intravenous Every 8 hours 11/23/21 1042     11/20/21 1500  rifabutin (MYCOBUTIN) capsule 300 mg        300 mg Oral Daily 11/20/21 1341     11/20/21 1430  sulfamethoxazole-trimethoprim (BACTRIM DS) 800-160 MG per tablet 1 tablet        1 tablet Oral Daily 11/20/21 1342     11/17/21 0800  vancomycin (VANCOCIN) IVPB 1000 mg/200 mL premix  Status:  Discontinued        1,000 mg 200 mL/hr over 60 Minutes Intravenous Every 12 hours 11/16/21 1807 11/17/21 1048   11/16/21 1830  vancomycin (VANCOREADY) IVPB 1500 mg/300 mL        1,500 mg 150 mL/hr over 120 Minutes Intravenous STAT 11/16/21 1758 11/16/21 2048   11/16/21 1800  ceFEPIme (MAXIPIME) 2 g in sodium chloride 0.9 % 100 mL IVPB  Status:  Discontinued        2 g 200 mL/hr over 30 Minutes Intravenous Every 8 hours 11/16/21 1756 11/17/21 1048   11/11/21 2200  azithromycin (ZITHROMAX) tablet 500 mg        500 mg Oral Daily 11/11/21 1511     11/10/21 2200  azithromycin (ZITHROMAX) 500 mg in sodium chloride 0.9 % 250 mL IVPB  Status:  Discontinued  500 mg 250 mL/hr over 60 Minutes Intravenous Every 24 hours 11/10/21 1316 11/11/21 1511   11/10/21 1400  cefTRIAXone (ROCEPHIN) 2 g in sodium chloride 0.9 % 100 mL IVPB  Status:  Discontinued        2 g 200 mL/hr over 30 Minutes Intravenous Every 24 hours 11/10/21 1316 11/11/21 1511   11/10/21 1000  ethambutol (MYAMBUTOL) tablet 800 mg        800 mg Oral Daily 11/09/21 1840     11/10/21 1000  sulfamethoxazole-trimethoprim (BACTRIM DS) 800-160 MG per tablet 1 tablet  Status:  Discontinued        1 tablet Oral Daily 11/09/21 1840 11/10/21 1316   11/09/21 2200  azithromycin (ZITHROMAX) tablet 500 mg  Status:  Discontinued        500 mg Oral Daily at bedtime 11/09/21 1909 11/10/21 1316   11/09/21 1830  vancomycin (VANCOREADY) IVPB 1500 mg/300 mL        1,500 mg 150 mL/hr over 120 Minutes Intravenous STAT 11/09/21 1817 11/09/21 2157    11/09/21 1830  piperacillin-tazobactam (ZOSYN) IVPB 3.375 g        3.375 g 100 mL/hr over 30 Minutes Intravenous STAT 11/09/21 1817 11/09/21 1932   11/09/21 1700  bictegravir-emtricitabine-tenofovir AF (BIKTARVY) 50-200-25 MG per tablet 1 tablet  Status:  Discontinued        1 tablet Oral Daily 11/09/21 1608 11/23/21 1107       Subjective: Seen and examined at bedside and he was curled up and he states he did not feel as well today.  Was not very hungry.  Continue to have some pain.  Denies any chest pain or lightheadedness or dizziness.  Had some slightly darker colored urine.  No nausea or vomiting.  No other concerns or complaints at this time.  Objective: Vitals:   11/25/21 0358 11/25/21 1040 11/25/21 1324 11/25/21 1808  BP: 117/79 124/76 117/80 118/80  Pulse: (!) 112 (!) 111 98 90  Resp: 20 (!) 24 (!) 21 20  Temp: 99.8 F (37.7 C) 98.3 F (36.8 C) 98.8 F (37.1 C) 98.2 F (36.8 C)  TempSrc: Oral Oral Oral Oral  SpO2: 100% 96% 99% 98%  Weight:      Height:        Intake/Output Summary (Last 24 hours) at 11/25/2021 1942 Last data filed at 11/25/2021 1830 Gross per 24 hour  Intake --  Output 1150 ml  Net -1150 ml   Filed Weights   11/09/21 2040 11/18/21 0430  Weight: 67.9 kg 69.2 kg   Examination: Physical Exam:  Constitutional: Thin African-American male currently in mild distress appears uncomfortable and continues to be tachycardic Respiratory: Diminished to auscultation bilaterally with coarse breath sounds, no wheezing, rales, rhonchi or crackles. Normal respiratory effort and patient is not tachypenic. No accessory muscle use.  Unlabored breathing Cardiovascular: RRR, no murmurs / rubs / gallops. S1 and S2 auscultated.  Has mild edema on the right Abdomen: Soft, non-tender, non-distended. Bowel sounds positive.  GU: Deferred. Musculoskeletal: No clubbing / cyanosis of digits/nails. No joint deformity upper and lower extremities. Neurologic: CN 2-12 grossly intact  with no focal deficits. Romberg sign and cerebellar reflexes not assessed.  Psychiatric: Normal judgment and insight. Alert and oriented x 3. Normal mood and appropriate affect.   Data Reviewed: I have personally reviewed following labs and imaging studies  CBC: Recent Labs  Lab 11/20/21 0531 11/21/21 0431 11/23/21 1029 11/24/21 0455 11/25/21 0439  WBC 4.0 4.9 6.1 4.4 6.8  NEUTROABS  --   --  4.1  --  4.8  HGB 8.3* 8.7* 7.7* 7.5* 8.1*  HCT 27.8* 29.0* 26.9* 24.8* 27.5*  MCV 92.4 92.7 95.4 90.5 92.0  PLT 345 341 301 369 672*   Basic Metabolic Panel: Recent Labs  Lab 11/20/21 0531 11/21/21 0431 11/23/21 1029 11/24/21 0455 11/25/21 0439  NA 138 137 136 141 139  K 3.9 4.2 4.2 3.7 4.4  CL 109 109 112* 115* 108  CO2 19* 19* 15* 18* 20*  GLUCOSE 109* 89 82 122* 88  BUN _0 CREATININE 0.71 0.89 0.90 1.05 0.96  CALCIUM 9.1 9.4 8.9 9.2 9.3  MG 1.8 1.8 1.8 2.0 1.9  PHOS  --   --  4.2  --  5.0*   GFR: Estimated Creatinine Clearance: 85.8 mL/min (by C-G formula based on SCr of 0.96 mg/dL). Liver Function Tests: Recent Labs  Lab 11/19/21 1143 11/20/21 0531 11/21/21 0431 11/23/21 1029 11/25/21 0439  AST 41 47* 54* 56* 45*  ALT 57* 53* 54* 46* 41  ALKPHOS 238* 218* 230* 259* 311*  BILITOT 0.6 0.5 0.7 0.8 0.6  PROT 7.9 7.5 7.3 6.6 7.4  ALBUMIN 2.9* 2.7* 2.8* 2.6* 2.6*   No results for input(s): "LIPASE", "AMYLASE" in the last 168 hours. No results for input(s): "AMMONIA" in the last 168 hours. Coagulation Profile: No results for input(s): "INR", "PROTIME" in the last 168 hours. Cardiac Enzymes: No results for input(s): "CKTOTAL", "CKMB", "CKMBINDEX", "TROPONINI" in the last 168 hours. BNP (last 3 results) No results for input(s): "PROBNP" in the last 8760 hours. HbA1C: No results for input(s): "HGBA1C" in the last 72 hours. CBG: Recent Labs  Lab 11/23/21 0716  GLUCAP 115*   Lipid Profile: No results for input(s): "CHOL", "HDL", "LDLCALC", "TRIG",  "CHOLHDL", "LDLDIRECT" in the last 72 hours. Thyroid Function Tests: No results for input(s): "TSH", "T4TOTAL", "FREET4", "T3FREE", "THYROIDAB" in the last 72 hours. Anemia Panel: No results for input(s): "VITAMINB12", "FOLATE", "FERRITIN", "TIBC", "IRON", "RETICCTPCT" in the last 72 hours. Sepsis Labs: No results for input(s): "PROCALCITON", "LATICACIDVEN" in the last 168 hours.  Recent Results (from the past 240 hour(s))  Culture, blood (Routine X 2) w Reflex to ID Panel     Status: None   Collection Time: 11/16/21  7:00 PM   Specimen: BLOOD LEFT FOREARM  Result Value Ref Range Status   Specimen Description BLOOD LEFT FOREARM  Final   Special Requests IN PEDIATRIC BOTTLE Blood Culture adequate volume  Final   Culture   Final    NO GROWTH 5 DAYS Performed at [facility], [address], [place], [place] [address]    Report Status 11/21/2021 FINAL  Final  Culture, blood (Routine X 2) w Reflex to ID Panel     Status: None   Collection Time: 11/16/21  7:00 PM   Specimen: BLOOD LEFT WRIST  Result Value Ref Range Status   Specimen Description   Final    BLOOD LEFT WRIST BLOOD Performed at [facility] [address] [place], [place] [address]    Special Requests IN PEDIATRIC BOTTLE Blood Culture adequate volume  Final   Culture   Final    NO GROWTH 5 DAYS Performed at [facility], [place] [address], [place], [place] [address]    Report Status 11/21/2021 FINAL  Final  Culture, blood (Routine X 2) w Reflex to ID Panel     Status: None (Preliminary result)   Collection Time: 11/21/21  4:18 PM  Specimen: BLOOD  Result Value Ref Range Status   Specimen Description   Final    BLOOD BLOOD LEFT FOREARM Performed at [facility] [address] [place], [place] [address]    Special Requests   Final    BOTTLES DRAWN AEROBIC ONLY Blood Culture results may not be optimal due to an inadequate volume of blood received in culture  bottles Performed at [facility] [address], [place], [place] [address]    Culture   Final    NO GROWTH 4 DAYS Performed at [facility], [place] [address], [place], [place] [address]    Report Status PENDING  Incomplete  Culture, blood (Routine X 2) w Reflex to ID Panel     Status: None (Preliminary result)   Collection Time: 11/21/21  4:18 PM   Specimen: BLOOD  Result Value Ref Range Status   Specimen Description   Final    BLOOD BLOOD LEFT WRIST Performed at [facility] [address], [place], [place] [address]    Special Requests   Final    IN PEDIATRIC BOTTLE Blood Culture adequate volume Performed at [facility] [address], [place], [place] [address]    Culture   Final    NO GROWTH 4 DAYS Performed at [facility], [place] [address] [place],  [address]    Report Status PENDING  Incomplete     Radiology Studies: CT CHEST ABDOMEN PELVIS WO CONTRAST  Result Date: 11/25/2021 CLINICAL DATA:  Ongoing fever in a 46 year old male. * Tracking Code: BO * EXAM: CT CHEST, ABDOMEN AND PELVIS WITHOUT CONTRAST TECHNIQUE: Multidetector CT imaging of the chest, abdomen and pelvis was performed following the standard protocol without IV contrast. RADIATION DOSE REDUCTION: This exam was performed according to the departmental dose-optimization program which includes automated exposure control, adjustment of the mA and/or kV according to patient size and/or use of iterative reconstruction technique. COMPARISON:  CT of the chest October 06, 2021 and CT of the chest of May of 2023. FINDINGS: CT CHEST FINDINGS Cardiovascular: Calcified aortic atherosclerosis. Three-vessel coronary artery disease. Low-attenuation cardiac blood pool compatible with anemia. Normal caliber of central pulmonary vessels. Limited assessment of cardiovascular structures given lack of intravenous contrast. Mediastinum/Nodes: Fullness of the LEFT  supraclavicular region. Suggestion of soft tissue between vessels in the LEFT neck and between musculature of the neck (image 2/2) 12 mm. Increased soft tissue in the AP window.  (Image 22/2) 18 mm. Borderline enlarged RIGHT paratracheal and subcarinal lymph nodes. No gross hilar lymphadenopathy though there is suprahilar masslike consolidation that is contiguous with soft tissue in the AP window and has increased steadily over time dating back to January of 2022. On the January exam there is clear evidence of enlarged lymph nodes in the LEFT supraclavicular region. These images were obtained at an outside facility, [facility]. At that time there was diffuse adenopathy in the chest and abdomen. Lungs/Pleura: Pleural base nodule in the RIGHT upper lobe associated with bandlike opacity but with more nodular component (image 44/6) 2.3 x 1.4 cm the area measured on today's study corresponds to an area that measured approximately 2.5 x 1.3 cm previously. Bandlike thickening tracks along the pleural surface into the RIGHT lung apex. Signs of pulmonary emphysema. Stable LEFT upper lobe pulmonary nodules (image 58/6 as an example an 8 mm nodule with a slightly larger nodule in the medial LEFT upper lobe. Musculoskeletal: See below  for full musculoskeletal details. CT ABDOMEN PELVIS FINDINGS Hepatobiliary: Liver with smooth contours. No pericholecystic stranding. No gross biliary duct distension. Pancreas: Normal contour. No gross signs of inflammation. Mild pancreatic ductal dilation is unchanged. Spleen: Normal size and contour. Adrenals/Urinary Tract: Adrenal glands are normal. Smooth renal contours showing mild perinephric stranding. No perinephric fluid. No hydronephrosis. Urinary bladder with smooth contours without bladder wall thickening or adjacent stranding. Stomach/Bowel: Mild stranding in the LEFT upper quadrant jejunal mesentery. It increasing lymph nodes within the central small bowel  mesentery (image 75/2) 17 mm lymph node is more irregular and denser than lymph nodes that were present in this area previously. Stranding about the jejunum in the mesentery is slightly increased as well. Another example of increased enlargement of lymph nodes in the upper abdomen (image 76/2) 15 mm at the root of the small bowel mesentery, largest lymph node previously 10 mm. Vascular/Lymphatic: Juxta crural lymph node (image 52/2) 13 mm previously less than a cm. Enlargement of nodal tissue adjacent the LEFT adrenal gland (image 58/2) 13 mm previously less than a cm. LEFT and RIGHT paratracheal nodal tissue up to 15 mm short axis (image 67/2) previously 11 mm. Reproductive: Unremarkable by CT. Other: No ascites Musculoskeletal: No acute bone finding or destructive bone process avascular necrosis of RIGHT humeral head. This is unchanged compared to recent comparison imaging. IMPRESSION: 1. Worsening nodal disease since recent imaging from May in April in the chest and abdomen though improved when compared to January. Findings may reflect either sequela of age related infection or could potentially be related to neoplasm/lymphoproliferative disorder. Would suggest correlation with PET imaging to select a site for sampling. This could also be utilized to assess pulmonary findings that have raise concern on recent imaging. 2. More masslike appearance of RIGHT upper lobe changes and LEFT upper lobe nodules as discussed above, consideration for pulmonary neoplasm as discussed on recent imaging. 3. Signs of pulmonary emphysema. 4. Three-vessel coronary artery disease. 5. Low-attenuation cardiac blood pool compatible with anemia. 6. Stranding about jejunal loops in the upper abdomen is of uncertain significance but is associated with increasing nodal disease, perhaps related to inflammation or lymphatic congestion. Attention on follow-up. 7. Bilateral perinephric stranding which was not evident on most recent abdominal  imaging. Correlation with urinalysis is suggested. Aortic Atherosclerosis (ICD10-I70.0) and Emphysema (ICD10-J43.9). Electronically Signed   By: Zetta Bills M.D.   On: 11/25/2021 15:35     Scheduled Meds:  abacavir-dolutegravir-lamiVUDine  1 tablet Oral Daily   azithromycin  500 mg Oral Daily   B-complex with vitamin C  1 tablet Oral Daily   dronabinol  5 mg Oral BID AC   ethambutol  800 mg Oral Daily   feeding supplement  237 mL Oral TID BM   ketoconazole   Topical BID   metoprolol succinate  100 mg Oral q morning   mirtazapine  15 mg Oral QHS   mometasone-formoterol  2 puff Inhalation BID   pantoprazole  40 mg Oral QAC breakfast   QUEtiapine  200 mg Oral QHS   rifabutin  300 mg Oral Daily   rivaroxaban  20 mg Oral Q supper   rosuvastatin  5 mg Oral q morning   sodium chloride flush  3 mL Intravenous Q12H   sulfamethoxazole-trimethoprim  1 tablet Oral Daily   umeclidinium bromide  1 puff Inhalation Daily   Continuous Infusions:  piperacillin-tazobactam (ZOSYN)  IV 3.375 g (11/25/21 1257)    LOS: 15 days   Raiford Noble, DO  [facility] Available via Epic secure chat 7am-7pm After these hours, please refer to coverage provider listed on [email or web address] 11/25/2021, 7:42 PM

## 2021-11-25 NOTE — Progress Notes (Signed)
Daily Progress Note   Patient Name: Kurt Foley       Date: 11/25/2021 DOB: 02-Mar-1976  Age: 46 y.o. MRN#: [id number] Attending Physician: Kerney Elbe, DO Primary Care Physician: Gildardo Pounds, NP Admit Date: 11/09/2021  Reason for Consultation/Follow-up: Establishing goals of care  Subjective: Patient reports pain well controlled with morphine Tolerating increased dose of marinol though he cannot tell it has helped his appetite much, encouraged sips of nutritional supplements as much as he can tolerate Requesting k pad for pain  Length of Stay: 15  Current Medications: Scheduled Meds:   abacavir-dolutegravir-lamiVUDine  1 tablet Oral Daily   azithromycin  500 mg Oral Daily   B-complex with vitamin C  1 tablet Oral Daily   dronabinol  5 mg Oral BID AC   ethambutol  800 mg Oral Daily   feeding supplement  237 mL Oral TID BM   ketoconazole   Topical BID   metoprolol succinate  100 mg Oral q morning   mirtazapine  15 mg Oral QHS   mometasone-formoterol  2 puff Inhalation BID   pantoprazole  40 mg Oral QAC breakfast   QUEtiapine  200 mg Oral QHS   rifabutin  300 mg Oral Daily   rivaroxaban  20 mg Oral Q supper   rosuvastatin  5 mg Oral q morning   sodium chloride flush  3 mL Intravenous Q12H   sulfamethoxazole-trimethoprim  1 tablet Oral Daily   umeclidinium bromide  1 puff Inhalation Daily    Continuous Infusions:  piperacillin-tazobactam (ZOSYN)  IV 3.375 g (11/25/21 1257)    PRN Meds: acetaminophen (TYLENOL) oral liquid 160 mg/5 mL, acetaminophen **OR** acetaminophen, chlorpheniramine-HYDROcodone, guaiFENesin-dextromethorphan, hydrALAZINE, ibuprofen, ipratropium-albuterol, metoprolol tartrate, morphine, morphine injection, ondansetron (ZOFRAN) IV, polyethylene glycol,  senna-docusate  Physical Exam Constitutional:      General: He is not in acute distress.    Appearance: He is ill-appearing.  Pulmonary:     Effort: Pulmonary effort is normal.  Skin:    General: Skin is warm and dry.  Neurological:     Mental Status: He is alert and oriented to person, place, and time.             Vital Signs: BP 117/80   Pulse 98   Temp 98.8 F (37.1 C) (Oral)   Resp (!) 21   Ht _0  (1.676 m)   Wt 69.2 kg   SpO2 99%   BMI 24.62 kg/m  SpO2: SpO2: 99 % O2 Device: O2 Device: Room Air O2 Flow Rate: O2 Flow Rate (L/min): 2 L/min  Intake/output summary:  Intake/Output Summary (Last 24 hours) at 11/25/2021 1346 Last data filed at 11/25/2021 1302 Gross per 24 hour  Intake --  Output 500 ml  Net -500 ml    LBM: Last BM Date : 11/25/21 Baseline Weight: Weight: 67.9 kg Most recent weight: Weight: 69.2 kg       Palliative Assessment/Data: PPS 40%      Patient Active Problem List   Diagnosis Date Noted   Goals of care, counseling/discussion    General weakness    Encounter for palliative care    Mediastinal lymphadenopathy    Pneumonia 11/10/2021   SIRS (systemic  inflammatory response syndrome) ([place]) 11/09/2021   Pulmonary embolism ([place]) 10/29/2021   Tinea corporis and facialis 10/07/2021   Acute respiratory infection    Tinea barbae    Dyspnea 10/06/2021   Mass of upper lobe of right lung 10/06/2021   Cough 09/14/2021   Insomnia 08/08/2021   Pancytopenia ([place]) 08/08/2021   Abnormal LFTs 08/08/2021   Neutropenia ([place]) 08/08/2021   Sinus tachycardia    Encephalitis 07/22/2021   Debility 07/20/2021   Hypokalemia 07/19/2021   Hyponatremia 07/18/2021   Malnutrition of moderate degree 07/15/2021   FTT (failure to thrive) in adult 07/13/2021   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   CVA (cerebral vascular accident) ([place]) 07/08/2021   History of CVA with residual right hemiparesis 05/25/2021   Rash of mouth  present on examination 03/25/2021   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[place]) 03/25/2021   Need for prophylactic vaccination and inoculation against smallpox 03/25/2021   Numbness and tingling of right arm 03/24/2021   AIDS (acquired immune deficiency syndrome) ([place]) 03/05/2021   Emphysema, unspecified ([place]) 03/05/2021   Cocaine-induced mood disorder ([place])    Tinea pedis 08/06/2019   At risk for opportunistic infections 07/14/2019   AKI (acute kidney injury) ([place]) 06/02/2019   Generalized weakness 06/02/2019   Cocaine abuse ([place]) 11/25/2018   Major depressive disorder, 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/2018   Substance induced mood disorder ([place]) 09/16/2015   Cocaine use disorder, severe, dependence ([place])    Cannabis use disorder, severe, dependence ([place]) 04/09/2015   Severe recurrent major depression without psychotic features ([place]) 04/08/2015   Alcohol abuse 04/08/2015   Screening examination for venereal disease 03/25/2015   Encounter for long-term (current) use of medications 03/25/2015   Tobacco abuse    Visual disturbance 11/18/2013   Human immunodeficiency virus (HIV) disease ([place]) 10/30/2013   Atopic dermatitis 10/30/2013    Palliative Care Assessment & Plan   HPI: 46 year old gentleman with advanced AIDS, disseminated MAC, substance use, depression prior stroke with residual right-sided weakness, history of pulmonary embolism, admitted with cough and palpitations.  CT scan concerning for possible lung mass, underwent bronchoscopy, infectious disease following.  Has right upper lobe mass.  Bronchoscopy brushings negative, consideration was being given to pursuing biopsy however plan is to proceed with outpatient PET scan.  Hospital course complicated by SIRS, patient off-and-on spiking fevers.  Remains on azithromycin. Palliative medicine team following for CODE STATUS and broad goals of care discussions  Assessment: Goals continue for  full code/full scope care  Recommendations/Plan: Continue marinol 5 mg BID K pad - have requested again Full code full scope  Goals of Care and Additional Recommendations: Limitations on Scope of Treatment: Full Scope Treatment  Code Status: Full code  Discharge Planning: To Be Determined  Care plan was discussed with patient, RN  Thank you for allowing the Palliative Medicine Team to assist in the care of this patient.  *Please note that this is a verbal dictation therefore any spelling or grammatical errors are due to the "[place] [employer]" system interpretation.  Juel Burrow, DNP, [facility] Palliative Medicine Team Team Phone # [phone]  Pager [phone]

## 2021-11-25 NOTE — Progress Notes (Signed)
[facility]   Reason for visit: Follow up on fever, HIV, PML, MAI, pulmonary mass  Interval History: continues to have fever with a Tmax of 103 overnight.  Some more sob, no cough.  More fatigued.    Physical Exam: Constitutional:  Vitals:   11/25/21 0358 11/25/21 1040  BP: 117/79 124/76  Pulse: (!) 112 (!) 111  Resp: 20 (!) 24  Temp: 99.8 F (37.7 C) 98.3 F (36.8 C)  SpO2: 100% 96%   patient appears in NAD, fatigued appearing Respiratory: Normal respiratory effort; CTA B Cardiovascular: tachy RR  Review of Systems: Gastrointestinal: negative for nausea and diarrhea  Lab Results  Component Value Date   WBC 6.8 11/25/2021   HGB 8.1 (L) 11/25/2021   HCT 27.5 (L) 11/25/2021   MCV 92.0 11/25/2021   PLT 422 (H) 11/25/2021    Lab Results  Component Value Date   CREATININE 0.96 11/25/2021   BUN 9 11/25/2021   NA 139 11/25/2021   K 4.4 11/25/2021   CL 108 11/25/2021   CO2 20 (L) 11/25/2021    Lab Results  Component Value Date   ALT 41 11/25/2021   AST 45 (H) 11/25/2021   ALKPHOS 311 (H) 11/25/2021     Microbiology: Recent Results (from the past 240 hour(s))  Culture, blood (Routine X 2) w Reflex to ID Panel     Status: None   Collection Time: 11/16/21  7:00 PM   Specimen: BLOOD LEFT FOREARM  Result Value Ref Range Status   Specimen Description BLOOD LEFT FOREARM  Final   Special Requests IN PEDIATRIC BOTTLE Blood Culture adequate volume  Final   Culture   Final    NO GROWTH 5 DAYS Performed at [facility], [address] [place], [place] [address]    Report Status 11/21/2021 FINAL  Final  Culture, blood (Routine X 2) w Reflex to ID Panel     Status: None   Collection Time: 11/16/21  7:00 PM   Specimen: BLOOD LEFT WRIST  Result Value Ref Range Status   Specimen Description   Final    BLOOD LEFT WRIST BLOOD Performed at [facility], [address] [place], [place] [address]    Special Requests IN  PEDIATRIC BOTTLE Blood Culture adequate volume  Final   Culture   Final    NO GROWTH 5 DAYS Performed at [facility], [address], [place], [place] [address]    Report Status 11/21/2021 FINAL  Final  Culture, blood (Routine X 2) w Reflex to ID Panel     Status: None (Preliminary result)   Collection Time: 11/21/21  4:18 PM   Specimen: BLOOD  Result Value Ref Range Status   Specimen Description   Final    BLOOD BLOOD LEFT FOREARM Performed at [facility], [address], [place], [place] [address]    Special Requests   Final    BOTTLES DRAWN AEROBIC ONLY Blood Culture results may not be optimal due to an inadequate volume of blood received in culture bottles Performed at [facility], [address], [place], [place] [address]    Culture   Final    NO GROWTH 4 DAYS Performed at [facility], [address] [place], [place] [address]    Report Status PENDING  Incomplete  Culture, blood (Routine X 2) w Reflex to ID Panel     Status: None (Preliminary result)  Collection Time: 11/21/21  4:18 PM   Specimen: BLOOD  Result Value Ref Range Status   Specimen Description   Final    BLOOD BLOOD LEFT WRIST Performed at [facility], [address] [place], [place] [address]    Special Requests   Final    IN PEDIATRIC BOTTLE Blood Culture adequate volume Performed at [facility], [address] [place], [place] [address]    Culture   Final    NO GROWTH 4 DAYS Performed at [facility], [address] [place], [place] [address]    Report Status PENDING  Incomplete    Impression/Plan:  1. Fever - I started empiric piperacillin/tazobactam in hopes of some improvement of the fever by treating a presumed occult infection.  However he continues to spike high fevers.  There was a longer period fever-free but likely antiinflammatory effects of the antibiotics.  At this point, I will check a  CT c/a/p for anything new that we could consider - new atypical pneumonia, occult abscess, more lymphadenopathy?  Other differential may include lymphoproliferative disorder.   Will continue with current plan  2.  MAI infection - on treatment now with three drugs and will continue.  It is difficult with his underlying poor immune recovery.  Will continue.    3.  Shortness of breath - will check a CT scan of his lungs as above to see if there is any worsening, new changes or anything else.    I will continue to monitor as needed over the weekend

## 2021-11-25 NOTE — Progress Notes (Addendum)
Patient ate 0% of breakfast and lunch.  Drank most of one scheduled nutritional supplement (Ensure).  Received PRN morphine IV and PO for pain throughout shift.  Nursing staff assisted patient to chair at breakfast time and again at dinnertime.  Patient noted to have amber/pink urine.  MD notified.  Bradd Burner, RN

## 2021-11-25 NOTE — Plan of Care (Signed)

## 2021-11-26 DIAGNOSIS — G8929 Other chronic pain: Secondary | ICD-10-CM | POA: Diagnosis not present

## 2021-11-26 DIAGNOSIS — R6510 Systemic inflammatory response syndrome (SIRS) of non-infectious origin without acute organ dysfunction: Secondary | ICD-10-CM | POA: Diagnosis not present

## 2021-11-26 DIAGNOSIS — Z8673 Personal history of transient ischemic attack (TIA), and cerebral infarction without residual deficits: Secondary | ICD-10-CM | POA: Diagnosis not present

## 2021-11-26 DIAGNOSIS — Z7189 Other specified counseling: Secondary | ICD-10-CM | POA: Diagnosis not present

## 2021-11-26 DIAGNOSIS — B20 Human immunodeficiency virus [HIV] disease: Secondary | ICD-10-CM | POA: Diagnosis not present

## 2021-11-26 DIAGNOSIS — R531 Weakness: Secondary | ICD-10-CM | POA: Diagnosis not present

## 2021-11-26 DIAGNOSIS — R7989 Other specified abnormal findings of blood chemistry: Secondary | ICD-10-CM | POA: Diagnosis not present

## 2021-11-26 DIAGNOSIS — R509 Fever, unspecified: Secondary | ICD-10-CM | POA: Diagnosis not present

## 2021-11-26 LAB — COMPREHENSIVE METABOLIC PANEL
ALT: 37 U/L (ref 0–44)
AST: 40 U/L (ref 15–41)
Albumin: 2.6 g/dL — ABNORMAL LOW (ref 3.5–5.0)
Alkaline Phosphatase: 275 U/L — ABNORMAL HIGH (ref 38–126)
Anion gap: 9 (ref 5–15)
BUN: 10 mg/dL (ref 6–20)
CO2: 19 mmol/L — ABNORMAL LOW (ref 22–32)
Calcium: 9.4 mg/dL (ref 8.9–10.3)
Chloride: 108 mmol/L (ref 98–111)
Creatinine, Ser: 0.88 mg/dL (ref 0.61–1.24)
GFR, Estimated: >60 mL/min (ref >60)
Glucose, Bld: 95 mg/dL (ref 70–99)
Potassium: 4.3 mmol/L (ref 3.5–5.1)
Sodium: 136 mmol/L (ref 135–145)
Total Bilirubin: 0.3 mg/dL (ref 0.3–1.2)
Total Protein: 7.3 g/dL (ref 6.5–8.1)

## 2021-11-26 LAB — CBC WITH DIFFERENTIAL/PLATELET
Abs Immature Granulocytes: 0.12 10*3/uL — ABNORMAL HIGH (ref 0.00–0.07)
Basophils Absolute: 0 10*3/uL (ref 0.0–0.1)
Basophils Relative: 1 %
Eosinophils Absolute: 0 10*3/uL (ref 0.0–0.5)
Eosinophils Relative: 1 %
HCT: 26.7 % — ABNORMAL LOW (ref 39.0–52.0)
Hemoglobin: 8 g/dL — ABNORMAL LOW (ref 13.0–17.0)
Immature Granulocytes: 2 %
Lymphocytes Relative: 12 %
Lymphs Abs: 0.7 10*3/uL (ref 0.7–4.0)
MCH: 27.2 pg (ref 26.0–34.0)
MCHC: 30 g/dL (ref 30.0–36.0)
MCV: 90.8 fL (ref 80.0–100.0)
Monocytes Absolute: 0.5 10*3/uL (ref 0.1–1.0)
Monocytes Relative: 9 %
Neutro Abs: 4.4 10*3/uL (ref 1.7–7.7)
Neutrophils Relative %: 75 %
Platelets: 466 10*3/uL — ABNORMAL HIGH (ref 150–400)
RBC: 2.94 MIL/uL — ABNORMAL LOW (ref 4.22–5.81)
RDW: 16.7 % — ABNORMAL HIGH (ref 11.5–15.5)
WBC: 5.8 10*3/uL (ref 4.0–10.5)
nRBC: 0 % (ref 0.0–0.2)

## 2021-11-26 LAB — CULTURE, BLOOD (ROUTINE X 2)
Culture: NO GROWTH
Culture: NO GROWTH
Special Requests: ADEQUATE

## 2021-11-26 LAB — MAGNESIUM: Magnesium: 2.1 mg/dL (ref 1.7–2.4)

## 2021-11-26 LAB — PHOSPHORUS: Phosphorus: 4.4 mg/dL (ref 2.5–4.6)

## 2021-11-26 MED ORDER — OXYCODONE HCL 5 MG PO TABS
5.0000 mg | ORAL_TABLET | ORAL | Status: DC | PRN
  Administered 2021-11-26 (×2): 10 mg via ORAL
  Administered 2021-11-27: 5 mg via ORAL
  Administered 2021-11-27 – 2021-11-28 (×5): 10 mg via ORAL
  Administered 2021-11-29: 5 mg via ORAL
  Administered 2021-11-29 – 2021-11-30 (×2): 10 mg via ORAL
  Administered 2021-11-30: 5 mg via ORAL
  Administered 2021-11-30 – 2021-12-07 (×24): 10 mg via ORAL
  Filled 2021-11-26 (×5): qty 2
  Filled 2021-11-26: qty 1
  Filled 2021-11-26 (×13): qty 2
  Filled 2021-11-26: qty 1
  Filled 2021-11-26 (×13): qty 2
  Filled 2021-11-26: qty 1
  Filled 2021-11-26 (×2): qty 2

## 2021-11-26 MED ORDER — BACLOFEN 10 MG PO TABS
5.0000 mg | ORAL_TABLET | Freq: Three times a day (TID) | ORAL | Status: DC | PRN
Start: 2021-11-26 — End: 2021-12-07
  Administered 2021-11-26 – 2021-12-01 (×7): 5 mg via ORAL
  Filled 2021-11-26 (×7): qty 1

## 2021-11-26 MED ORDER — MORPHINE SULFATE (PF) 2 MG/ML IV SOLN
2.0000 mg | INTRAVENOUS | Status: DC | PRN
  Administered 2021-11-26 – 2021-12-04 (×7): 2 mg via INTRAVENOUS
  Filled 2021-11-26 (×8): qty 1

## 2021-11-26 MED ORDER — KCL IN DEXTROSE-NACL 20-5-0.45 MEQ/L-%-% IV SOLN
INTRAVENOUS | Status: AC
  Filled 2021-11-26: qty 1000

## 2021-11-26 MED ORDER — SODIUM CHLORIDE 0.9 % IV BOLUS
500.0000 mL | Freq: Once | INTRAVENOUS | Status: AC
  Administered 2021-11-26: 500 mL via INTRAVENOUS

## 2021-11-26 MED ORDER — KCL IN DEXTROSE-NACL 20-5-0.45 MEQ/L-%-% IV SOLN: INTRAVENOUS | Status: DC

## 2021-11-26 NOTE — Progress Notes (Signed)
PROGRESS NOTE    Kurt Foley  ZOX:[id number] DOB: 1976/06/12 DOA: 11/09/2021 PCP: Gildardo Pounds, NP   Brief Narrative:  46 year old with advanced AIDS, disseminated MAC substance abuse, depression, prior CVA with residual right-sided weakness, pulmonary embolism on Xarelto admitted for cough and palpitations.  Repeat CT showed abnormal lung parenchyma.  He was seen by pulmonary, cultures remain negative.  Underwent bronchoscopy with EBUS on 6/5 with subcarinal lymph node sampling and brushing with BAL of right upper lobe mass.  Brushings remain negative, IR consulted for CT-guided biopsy.  Patient off-and-on spiking fever, discussed with ID and this is likely due to MAC.  Patient has also been seen by palliative care service, wishes to be full code. Unforturnately continues to spike fevers daily leading to tachycardia.   ID is now changing antibiotics and trying IV Zosyn.  Continues to have temperatures and tachycardia along with some back pain but fever seems improved this morning and ID recommends continuing current antibiotics and regimen at this time.  Palliative care is increasing his blood but now and PT OT recommending outpatient PT.  Given his recurrent fevers ID is now checking a CT of the chest abdomen pelvis to see if there is anything new that they could consider; otherwise that they are going to continue his current plan however they have changed his HIV medication from [place] to [place].  He has not changed his morphine p.o. to oxycodone.  Given his poor appetite we will start him on D5W half-normal saline at 75 MLS per hour and also given 500 mill bolus given his slight hypotension.  CT Scan of the Chest/Abd/Pelvis done and showed "Worsening nodal disease since recent imaging from May in April in the chest and abdomen though improved when compared to January. Findings may reflect either sequela of age related infection or could potentially be related to  neoplasm/lymphoproliferative disorder. Would suggest correlation with PET imaging to select a site for sampling. This could also be utilized to assess pulmonary findings that have raise concern on recent imaging. More  masslike appearance of RIGHT upper lobe changes and LEFT upper lobe nodules as discussed above, consideration for  pulmonary neoplasm as discussed on recent imaging. Signs of pulmonary emphysema. Three-vessel coronary artery disease.  Low-attenuation cardiac blood pool compatible with anemia.  Stranding about jejunal loops in the upper abdomen is of uncertain significance but is associated with increasing nodal disease, perhaps related to inflammation or lymphatic congestion. Attention on follow-up.  Bilateral perinephric stranding which was not evident on most recent abdominal imaging. Correlation with urinalysis is suggested. Aortic Atherosclerosis (ICD10-I70.0) and Emphysema."  ID recommends continuing current plan of care.   Assessment and Plan:  SIRS with recurrent fevers History of disseminated MAC infection - Seen by ID.  This is likely secondary to disseminated MAC history.  Current regimen-Biktarvy, azithromycin and ethambutol. Now on Rifabutin as well but given his interactions with drugs he was changed to Triumeq -ID feels that he has no real source of his fever but could be secondary to his disseminated MRSA infection -ID is now adding IV Zosyn for empiric treatment of occult infection and monitoring fever and fever continues to recur and continues to have high temperatures even though he had a period where he is fever free but likely in the anti-inflammatory effects of antibiotics -ID is now ordering a CT of the chest abdomen abdomen and pelvis to look for anything new that they can consider to treat including new atypical pneumonia, occult abscess or  more lymphadenopathy -CT Scan of the Chest/Abd/Pelvis done and showed "Worsening nodal disease since recent imaging from May in  April in the chest and abdomen though improved when compared to January. Findings may reflect either sequela of age related infection or could potentially be related to neoplasm/lymphoproliferative disorder. Would suggest correlation with PET imaging to select a site for sampling. This could also be utilized to assess pulmonary findings that have raise concern on recent imaging. More  masslike appearance of RIGHT upper lobe changes and LEFT upper lobe nodules as discussed above, consideration for  pulmonary neoplasm as discussed on recent imaging. Signs of pulmonary emphysema. Three-vessel coronary artery disease.  Low-attenuation cardiac blood pool compatible with anemia.  Stranding about jejunal loops in the upper abdomen is of uncertain significance but is associated with increasing nodal disease, perhaps related to inflammation or lymphatic congestion. Attention on follow-up.  Bilateral perinephric stranding which was not evident on most recent abdominal imaging. Correlation with urinalysis is suggested. Aortic Atherosclerosis (ICD10-I70.0) and Emphysema." -ID recommends continuing current plan of care as his fever has improved   Sinus Tachycardia with fevers.  - Continues to spike fevers and off-and-on having tachycardia.  Continue Toprol-XL 100 mg daily.  IV fluid hydration has stopped but is now resumed at D5 half-normal saline at 75 mils per hour and will give another 500 bolus -Also on IV metoprolol as needed   Right upper lobe mass Dyspnea with shortness of breath  -Status post bronchoscopy with EBUS on 6/5.  Brushings remain negative.  Case discussed with IR who recommends outptn PET scan. Spoke with Dr Lamonte Sakai from [facility], who also reviewed images and recommended outptn PET followed by follow up with Dr Valeta Harms for discussion of possible Navigation Bronch. There may be a component of infection which may clear with time?  -Getting a CT of the chest abdomen pelvis as above -On IV Zosyn now we will  continue for now -He will need follow-up on his mass noted   AKI Metabolic acidosis - AKI has resolved resolved -Patient's BUN/creatinine is now stable at 10/0.88 -Patient's metabolic acidosis is improving slightly and he has a CO2 of 19, anion gap of 9, chloride level of 108 -Avoid nephrotoxic medications, contrast dyes, hypotension and adjust medications   Pulmonary Embolus Has a history of DVT -Transitioned back to Xarelto.   History of CVA -Residual right-sided weakness with right hand and arm contractures.  On statin  Emphysema -Bronchodilators to be continued.  HIV with history of MAI. -Infectious diseases following.  currently on Triumeq and ethambutol and rifabutin. On azithromycin daily as well and now going on IV Zosyn -He is on 3 drugs and they will continue and they feel it is difficult with his underlying poor immune recovery -See above  Substance use -Counseled to quit using.  Depression -Continue with on Seroquel and Remeron   Normocytic Anemia -Patient's hemoglobin/hematocrit is now 7.7/26.9 -> 7.5/24.8 -> 8.1/27.5 -> 8.0/26.7 -Trended down from 9.6/31.4 on 6/8 -Check anemia panel in a.m. -Continue monitor for signs of a bleeding; no overt bleeding noted -Repeat CBC in the a.m.  GERD -Daily PPI with Pantoprazole 40 mg po Daily   Transaminitis -Trend LFTs -LFTs have been slightly abnormal but have now been improving and AST is now 40 and ALT 37 -Continue monitor and if necessary will obtain a right upper quadrant ultrasound   Goals of care Severe protein calorie malnutrition -Advanced HIV with very guarded prognosis.  -Palliative care following-he wishes to be full code -Supplements.  Marinol added and dose increased today given that he has extremely poor p.o. intake -Palliative care has adjusted his medication and is now on Oxycodone IR 5 to 10 mg every 4 hours as needed for moderate-severe pain -Marinol has been increased to 5 mg twice daily -See  IV fluid hydration as above given his poor p.o. intake  DVT prophylaxis:  rivaroxaban (XARELTO) tablet 20 mg    Code Status: Full Code Family Communication: No family currently at bedside  Disposition Plan:  Level of care: Progressive Status is: Inpatient Remains inpatient appropriate because: Needs SNF and continues to have temperatures; Had a T Max of 100.2   Consultants:  Infectious diseases Palliative care medicine Pulmonary Interventional radiology  Procedures:  Procedure(s): Flexible bronchoscopy (16109) Brushing (60454) of the RUL  Bronchial alveolar lavage (09811) of the RUL Endobronchial ultrasound (91478) Transbronchial needle aspiration (29562) of the station 7    He will be underwent a CT of the chest abdomen pelvis yesterday ordered by ID  Antimicrobials:  Anti-infectives (From admission, onward)    Start     Dose/Rate Route Frequency Ordered Stop   11/24/21 1000  abacavir-dolutegravir-lamiVUDine (TRIUMEQ) 600-50-300 MG per tablet 1 tablet        1 tablet Oral Daily 11/23/21 1107     11/23/21 1130  piperacillin-tazobactam (ZOSYN) IVPB 3.375 g        3.375 g 12.5 mL/hr over 240 Minutes Intravenous Every 8 hours 11/23/21 1042     11/20/21 1500  rifabutin (MYCOBUTIN) capsule 300 mg        300 mg Oral Daily 11/20/21 1341     11/20/21 1430  sulfamethoxazole-trimethoprim (BACTRIM DS) 800-160 MG per tablet 1 tablet        1 tablet Oral Daily 11/20/21 1342     11/17/21 0800  vancomycin (VANCOCIN) IVPB 1000 mg/200 mL premix  Status:  Discontinued        1,000 mg 200 mL/hr over 60 Minutes Intravenous Every 12 hours 11/16/21 1807 11/17/21 1048   11/16/21 1830  vancomycin (VANCOREADY) IVPB 1500 mg/300 mL        1,500 mg 150 mL/hr over 120 Minutes Intravenous STAT 11/16/21 1758 11/16/21 2048   11/16/21 1800  ceFEPIme (MAXIPIME) 2 g in sodium chloride 0.9 % 100 mL IVPB  Status:  Discontinued        2 g 200 mL/hr over 30 Minutes Intravenous Every 8 hours 11/16/21 1756  11/17/21 1048   11/11/21 2200  azithromycin (ZITHROMAX) tablet 500 mg        500 mg Oral Daily 11/11/21 1511     11/10/21 2200  azithromycin (ZITHROMAX) 500 mg in sodium chloride 0.9 % 250 mL IVPB  Status:  Discontinued        500 mg 250 mL/hr over 60 Minutes Intravenous Every 24 hours 11/10/21 1316 11/11/21 1511   11/10/21 1400  cefTRIAXone (ROCEPHIN) 2 g in sodium chloride 0.9 % 100 mL IVPB  Status:  Discontinued        2 g 200 mL/hr over 30 Minutes Intravenous Every 24 hours 11/10/21 1316 11/11/21 1511   11/10/21 1000  ethambutol (MYAMBUTOL) tablet 800 mg        800 mg Oral Daily 11/09/21 1840     11/10/21 1000  sulfamethoxazole-trimethoprim (BACTRIM DS) 800-160 MG per tablet 1 tablet  Status:  Discontinued        1 tablet Oral Daily 11/09/21 1840 11/10/21 1316   11/09/21 2200  azithromycin (ZITHROMAX) tablet 500 mg  Status:  Discontinued        500 mg Oral Daily at bedtime 11/09/21 1909 11/10/21 1316   11/09/21 1830  vancomycin (VANCOREADY) IVPB 1500 mg/300 mL        1,500 mg 150 mL/hr over 120 Minutes Intravenous STAT 11/09/21 1817 11/09/21 2157   11/09/21 1830  piperacillin-tazobactam (ZOSYN) IVPB 3.375 g        3.375 g 100 mL/hr over 30 Minutes Intravenous STAT 11/09/21 1817 11/09/21 1932   11/09/21 1700  bictegravir-emtricitabine-tenofovir AF (BIKTARVY) 50-200-25 MG per tablet 1 tablet  Status:  Discontinued        1 tablet Oral Daily 11/09/21 1608 11/23/21 1107       Subjective: Seen and examined at bedside he is wanting to rest and sleep.  States he felt a little bit better and did not have a temperature.  Continues to have some pain.  Is not eating very much.  Continues to feel little cold.  No other concerns or complaints at this time.  Objective: Vitals:   11/26/21 0200 11/26/21 0547 11/26/21 1311 11/26/21 1440  BP: 107/77 108/75 90/65 (!) 86/58  Pulse: (!) 106 (!) 102 100   Resp: _0 Temp: 98.3 F (36.8 C) 100.2 F (37.9 C) 98.4 F (36.9 C)   TempSrc: Oral  Oral Oral   SpO2: 97% 96% 97%   Weight:      Height:        Intake/Output Summary (Last 24 hours) at 11/26/2021 1528 Last data filed at 11/26/2021 0800 Gross per 24 hour  Intake 429.99 ml  Output 1075 ml  Net -645.01 ml   Filed Weights   11/09/21 2040 11/18/21 0430  Weight: 67.9 kg 69.2 kg   Examination: Physical Exam:  Constitutional: Thin African-American male currently in no acute distress and is a little bit more comfortable and is resting but when asleep Respiratory: Diminished to auscultation bilaterally with coarse breath sounds, no wheezing, rales, rhonchi or crackles. Normal respiratory effort and patient is not tachypenic. No accessory muscle use.  Unlabored breathing Cardiovascular: RRR, no murmurs / rubs / gallops. S1 and S2 auscultated.  Has some slight lower extremity edema on the right Abdomen: Soft, non-tender, non-distended. Bowel sounds positive.  GU: Deferred. Musculoskeletal: No clubbing / cyanosis of digits/nails.  Has a right arm contracture and hand contracture in the setting of his prior CVA Skin: No rashes, lesions, ulcers or skin evaluation. No induration; Warm and dry.  Neurologic: CN 2-12 grossly intact with no focal deficits.  Romberg sign and cerebellar reflexes not assessed.  Psychiatric: Normal judgment and insight. Alert and oriented x 3.  Appears a little somnolent and wanting to sleep.  Does appear a little bit depressed  Data Reviewed: I have personally reviewed following labs and imaging studies  CBC: Recent Labs  Lab 11/21/21 0431 11/23/21 1029 11/24/21 0455 11/25/21 0439 11/26/21 0530  WBC 4.9 6.1 4.4 6.8 5.8  NEUTROABS  --  4.1  --  4.8 4.4  HGB 8.7* 7.7* 7.5* 8.1* 8.0*  HCT 29.0* 26.9* 24.8* 27.5* 26.7*  MCV 92.7 95.4 90.5 92.0 90.8  PLT 341 301 369 422* 790*   Basic Metabolic Panel: Recent Labs  Lab 11/21/21 0431 11/23/21 1029 11/24/21 0455 11/25/21 0439 11/26/21 0530  NA 137 136 141 139 136  K 4.2 4.2 3.7 4.4 4.3  CL  109 112* 115* 108 108  CO2 19* 15* 18* 20* 19*  GLUCOSE 89 82 122* 88 95  BUN 12 10 14  9 10  CREATININE 0.89 0.90 1.05 0.96 0.88  CALCIUM 9.4 8.9 9.2 9.3 9.4  MG 1.8 1.8 2.0 1.9 2.1  PHOS  --  4.2  --  5.0* 4.4   GFR: Estimated Creatinine Clearance: 93.6 mL/min (by C-G formula based on SCr of 0.88 mg/dL). Liver Function Tests: Recent Labs  Lab 11/20/21 0531 11/21/21 0431 11/23/21 1029 11/25/21 0439 11/26/21 0530  AST 47* 54* 56* 45* 40  ALT 53* 54* 46* 41 37  ALKPHOS 218* 230* 259* 311* 275*  BILITOT 0.5 0.7 0.8 0.6 0.3  PROT 7.5 7.3 6.6 7.4 7.3  ALBUMIN 2.7* 2.8* 2.6* 2.6* 2.6*   No results for input(s): "LIPASE", "AMYLASE" in the last 168 hours. No results for input(s): "AMMONIA" in the last 168 hours. Coagulation Profile: No results for input(s): "INR", "PROTIME" in the last 168 hours. Cardiac Enzymes: No results for input(s): "CKTOTAL", "CKMB", "CKMBINDEX", "TROPONINI" in the last 168 hours. BNP (last 3 results) No results for input(s): "PROBNP" in the last 8760 hours. HbA1C: No results for input(s): "HGBA1C" in the last 72 hours. CBG: Recent Labs  Lab 11/23/21 0716 11/25/21 2105  GLUCAP 115* 106*   Lipid Profile: No results for input(s): "CHOL", "HDL", "LDLCALC", "TRIG", "CHOLHDL", "LDLDIRECT" in the last 72 hours. Thyroid Function Tests: No results for input(s): "TSH", "T4TOTAL", "FREET4", "T3FREE", "THYROIDAB" in the last 72 hours. Anemia Panel: No results for input(s): "VITAMINB12", "FOLATE", "FERRITIN", "TIBC", "IRON", "RETICCTPCT" in the last 72 hours. Sepsis Labs: No results for input(s): "PROCALCITON", "LATICACIDVEN" in the last 168 hours.  Recent Results (from the past 240 hour(s))  Culture, blood (Routine X 2) w Reflex to ID Panel     Status: None   Collection Time: 11/16/21  7:00 PM   Specimen: BLOOD LEFT FOREARM  Result Value Ref Range Status   Specimen Description BLOOD LEFT FOREARM  Final   Special Requests IN PEDIATRIC BOTTLE Blood Culture  adequate volume  Final   Culture   Final    NO GROWTH 5 DAYS Performed at [facility], [address] [place], [place] [address]    Report Status 11/21/2021 FINAL  Final  Culture, blood (Routine X 2) w Reflex to ID Panel     Status: None   Collection Time: 11/16/21  7:00 PM   Specimen: BLOOD LEFT WRIST  Result Value Ref Range Status   Specimen Description   Final    BLOOD LEFT WRIST BLOOD Performed at [facility] [address] [place], [place] [address]    Special Requests IN PEDIATRIC BOTTLE Blood Culture adequate volume  Final   Culture   Final    NO GROWTH 5 DAYS Performed at [facility], [place] [address], [place], [place] [address]    Report Status 11/21/2021 FINAL  Final  Culture, blood (Routine X 2) w Reflex to ID Panel     Status: None   Collection Time: 11/21/21  4:18 PM   Specimen: BLOOD  Result Value Ref Range Status   Specimen Description   Final    BLOOD BLOOD LEFT FOREARM Performed at [facility] [address] [place], [place] [address]    Special Requests   Final    BOTTLES DRAWN AEROBIC ONLY Blood Culture results may not be optimal due to an inadequate volume of blood received in culture bottles Performed at [facility] [address], [place], [place] [address]    Culture   Final    NO  GROWTH 5 DAYS Performed at [facility], [place] [address], [place], [place] [address]    Report Status 11/26/2021 FINAL  Final  Culture, blood (Routine X 2) w Reflex to ID Panel     Status: None   Collection Time: 11/21/21  4:18 PM   Specimen: BLOOD  Result Value Ref Range Status   Specimen Description   Final    BLOOD BLOOD LEFT WRIST Performed at [facility] [address], [place], [place] [address]    Special Requests   Final    IN PEDIATRIC BOTTLE Blood Culture adequate volume Performed at [facility] [address] [place], [place]  [address]    Culture   Final    NO GROWTH 5 DAYS Performed at [facility], [place] [address], [place], [place] [address]    Report Status 11/26/2021 FINAL  Final     Radiology Studies: CT CHEST ABDOMEN PELVIS WO CONTRAST  Result Date: 11/25/2021 CLINICAL DATA:  Ongoing fever in a 46 year old male. * Tracking Code: BO * EXAM: CT CHEST, ABDOMEN AND PELVIS WITHOUT CONTRAST TECHNIQUE: Multidetector CT imaging of the chest, abdomen and pelvis was performed following the standard protocol without IV contrast. RADIATION DOSE REDUCTION: This exam was performed according to the departmental dose-optimization program which includes automated exposure control, adjustment of the mA and/or kV according to patient size and/or use of iterative reconstruction technique. COMPARISON:  CT of the chest October 06, 2021 and CT of the chest of May of 2023. FINDINGS: CT CHEST FINDINGS Cardiovascular: Calcified aortic atherosclerosis. Three-vessel coronary artery disease. Low-attenuation cardiac blood pool compatible with anemia. Normal caliber of central pulmonary vessels. Limited assessment of cardiovascular structures given lack of intravenous contrast. Mediastinum/Nodes: Fullness of the LEFT supraclavicular region. Suggestion of soft tissue between vessels in the LEFT neck and between musculature of the neck (image 2/2) 12 mm. Increased soft tissue in the AP window.  (Image 22/2) 18 mm. Borderline enlarged RIGHT paratracheal and subcarinal lymph nodes. No gross hilar lymphadenopathy though there is suprahilar masslike consolidation that is contiguous with soft tissue in the AP window and has increased steadily over time dating back to January of 2022. On the January exam there is clear evidence of enlarged lymph nodes in the LEFT supraclavicular region. These images were obtained at an outside facility, [facility]. At that time there was diffuse adenopathy in the chest and abdomen. Lungs/Pleura:  Pleural base nodule in the RIGHT upper lobe associated with bandlike opacity but with more nodular component (image 44/6) 2.3 x 1.4 cm the area measured on today's study corresponds to an area that measured approximately 2.5 x 1.3 cm previously. Bandlike thickening tracks along the pleural surface into the RIGHT lung apex. Signs of pulmonary emphysema. Stable LEFT upper lobe pulmonary nodules (image 58/6 as an example an 8 mm nodule with a slightly larger nodule in the medial LEFT upper lobe. Musculoskeletal: See below for full musculoskeletal details. CT ABDOMEN PELVIS FINDINGS Hepatobiliary: Liver with smooth contours. No pericholecystic stranding. No gross biliary duct distension. Pancreas: Normal contour. No gross signs of inflammation. Mild pancreatic ductal dilation is unchanged. Spleen: Normal size and contour. Adrenals/Urinary Tract: Adrenal glands are normal. Smooth renal contours showing mild perinephric stranding. No perinephric fluid. No hydronephrosis. Urinary bladder with smooth contours without bladder wall thickening or adjacent stranding. Stomach/Bowel: Mild stranding in the LEFT upper quadrant jejunal mesentery. It increasing lymph nodes within the central small bowel mesentery (image 75/2)  17 mm lymph node is more irregular and denser than lymph nodes that were present in this area previously. Stranding about the jejunum in the mesentery is slightly increased as well. Another example of increased enlargement of lymph nodes in the upper abdomen (image 76/2) 15 mm at the root of the small bowel mesentery, largest lymph node previously 10 mm. Vascular/Lymphatic: Juxta crural lymph node (image 52/2) 13 mm previously less than a cm. Enlargement of nodal tissue adjacent the LEFT adrenal gland (image 58/2) 13 mm previously less than a cm. LEFT and RIGHT paratracheal nodal tissue up to 15 mm short axis (image 67/2) previously 11 mm. Reproductive: Unremarkable by CT. Other: No ascites Musculoskeletal: No  acute bone finding or destructive bone process avascular necrosis of RIGHT humeral head. This is unchanged compared to recent comparison imaging. IMPRESSION: 1. Worsening nodal disease since recent imaging from May in April in the chest and abdomen though improved when compared to January. Findings may reflect either sequela of age related infection or could potentially be related to neoplasm/lymphoproliferative disorder. Would suggest correlation with PET imaging to select a site for sampling. This could also be utilized to assess pulmonary findings that have raise concern on recent imaging. 2. More masslike appearance of RIGHT upper lobe changes and LEFT upper lobe nodules as discussed above, consideration for pulmonary neoplasm as discussed on recent imaging. 3. Signs of pulmonary emphysema. 4. Three-vessel coronary artery disease. 5. Low-attenuation cardiac blood pool compatible with anemia. 6. Stranding about jejunal loops in the upper abdomen is of uncertain significance but is associated with increasing nodal disease, perhaps related to inflammation or lymphatic congestion. Attention on follow-up. 7. Bilateral perinephric stranding which was not evident on most recent abdominal imaging. Correlation with urinalysis is suggested. Aortic Atherosclerosis (ICD10-I70.0) and Emphysema (ICD10-J43.9). Electronically Signed   By: Zetta Bills M.D.   On: 11/25/2021 15:35     Scheduled Meds:  abacavir-dolutegravir-lamiVUDine  1 tablet Oral Daily   azithromycin  500 mg Oral Daily   B-complex with vitamin C  1 tablet Oral Daily   dronabinol  5 mg Oral BID AC   ethambutol  800 mg Oral Daily   feeding supplement  237 mL Oral TID BM   ketoconazole   Topical BID   metoprolol succinate  100 mg Oral q morning   mirtazapine  15 mg Oral QHS   mometasone-formoterol  2 puff Inhalation BID   pantoprazole  40 mg Oral QAC breakfast   QUEtiapine  200 mg Oral QHS   rifabutin  300 mg Oral Daily   rivaroxaban  20 mg Oral  Q supper   rosuvastatin  5 mg Oral q morning   sodium chloride flush  3 mL Intravenous Q12H   sulfamethoxazole-trimethoprim  1 tablet Oral Daily   umeclidinium bromide  1 puff Inhalation Daily   Continuous Infusions:  dextrose 5 % and 0.45 % NaCl with KCl 20 mEq/L     piperacillin-tazobactam (ZOSYN)  IV 3.375 g (11/26/21 1359)    LOS: 16 days   Raiford Noble, DO [facility] Available via Epic secure chat 7am-7pm After these hours, please refer to coverage provider listed on [email or web address] 11/26/2021, 3:28 PM

## 2021-11-26 NOTE — Progress Notes (Signed)
    [facility]   Reason for visit: Follow up on fever, HIV  Interval History: CT without any particular new findings; fever curve trending down  Physical Exam: Constitutional:  Vitals:   11/26/21 0200 11/26/21 0547  BP: 107/77 108/75  Pulse: (!) 106 (!) 102  Resp: 20 20  Temp: 98.3 F (36.8 C) 100.2 F (37.9 C)  SpO2: 97% 96%   patient appears in NAD  Impression/Plan: fever and improved since starting piperacillin/tazobactam.  Will continue for now.  No changes otherwise.

## 2021-11-26 NOTE — Progress Notes (Signed)
Daily Progress Note   Patient Name: Kurt Foley       Date: 11/26/2021 DOB: 20-Feb-1976  Age: 46 y.o. MRN#: [id number] Attending Physician: Kurt Elbe, DO Primary Care Physician: Kurt Pounds, NP Admit Date: 11/09/2021  Reason for Consultation/Follow-up: Establishing goals of care  Subjective: I saw and examined Kurt Foley.  He was lying in bed in no distress time my encounter.  We discussed pain management.  He reports that he has been having some increased pain.  Talked about what has been working and he reports that he feels that currently ordered morphine is not as effective as medications he has been on in the past.  We discussed this and he tells me that he was on oxycodone 5 to 10 mg as needed during time in CIR and he was discharged home with this medication at the end of that stay.  He felt this medication treated his pain better than current ordered medication of morphine 7.5 mg as needed.  We discussed trial of rotating back to oxycodone as needed.  Also discussed bowel regimen.  Reports that he is not have any problem with constipation and actually has watery stool on many occasions.  We also discussed his appetite.  Reports he has been "a little more hungry" and eating a little more since starting Marinol.  Would like to continue with Marinol and is hopeful to continue to improve his intake overall.  We also reviewed his overall goals and he remains invested in plan to continue with any and all offered interventions.  Length of Stay: 16  Current Medications: Scheduled Meds:   abacavir-dolutegravir-lamiVUDine  1 tablet Oral Daily   azithromycin  500 mg Oral Daily   B-complex with vitamin C  1 tablet Oral Daily   dronabinol  5 mg Oral BID AC   ethambutol  800 mg Oral Daily    feeding supplement  237 mL Oral TID BM   ketoconazole   Topical BID   metoprolol succinate  100 mg Oral q morning   mirtazapine  15 mg Oral QHS   mometasone-formoterol  2 puff Inhalation BID   pantoprazole  40 mg Oral QAC breakfast   QUEtiapine  200 mg Oral QHS   rifabutin  300 mg Oral Daily   rivaroxaban  20 mg Oral Q supper   rosuvastatin  5 mg Oral q morning   sodium chloride flush  3 mL Intravenous Q12H   sulfamethoxazole-trimethoprim  1 tablet Oral Daily   umeclidinium bromide  1 puff Inhalation Daily    Continuous Infusions:  dextrose 5 % and 0.45 % NaCl with KCl 20 mEq/L     piperacillin-tazobactam (ZOSYN)  IV 3.375 g (11/26/21 1359)    PRN Meds: acetaminophen (TYLENOL) oral liquid 160 mg/5 mL, acetaminophen **OR** acetaminophen, chlorpheniramine-HYDROcodone, guaiFENesin-dextromethorphan, hydrALAZINE, ibuprofen, ipratropium-albuterol, metoprolol tartrate, morphine injection, ondansetron (ZOFRAN) IV, oxyCODONE, polyethylene glycol, senna-docusate  Physical Exam Constitutional:      General: He is not in acute distress.    Appearance: He is ill-appearing.  Pulmonary:     Effort: Pulmonary effort is normal.  Skin:    General: Skin is warm and dry.  Neurological:     Mental Status:  He is alert and oriented to person, place, and time.            Vital Signs: BP (!) 86/58 (BP Location: Left Arm)   Pulse 100   Temp 98.4 F (36.9 C) (Oral)   Resp 20   Ht _0  (1.676 m)   Wt 69.2 kg   SpO2 97%   BMI 24.62 kg/m  SpO2: SpO2: 97 % O2 Device: O2 Device: Room Air O2 Flow Rate: O2 Flow Rate (L/min): 2 L/min  Intake/output summary:  Intake/Output Summary (Last 24 hours) at 11/26/2021 1543 Last data filed at 11/26/2021 0800 Gross per 24 hour  Intake 429.99 ml  Output 1075 ml  Net -645.01 ml    LBM: Last BM Date : 11/26/21 Baseline Weight: Weight: 67.9 kg Most recent weight: Weight: 69.2 kg       Palliative Assessment/Data: PPS 40%      Patient Active  Problem List   Diagnosis Date Noted   Cocaine abuse ([place]) 11/25/2018   Mass of upper lobe of right lung 10/06/2021   Tinea corporis and facialis 10/07/2021   Abnormal LFTs 08/08/2021   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[place]) 03/25/2021   Emphysema, unspecified ([place]) 03/05/2021   Human immunodeficiency virus (HIV) disease ([place]) 10/30/2013   Pulmonary embolism ([place]) 10/29/2021   History of CVA with residual right hemiparesis 05/25/2021   Dyspnea 10/06/2021   Goals of care, counseling/discussion    General weakness    Encounter for palliative care    Mediastinal lymphadenopathy    Pneumonia 11/10/2021   SIRS (systemic inflammatory response syndrome) ([facility]) 11/09/2021   Acute respiratory infection    Tinea barbae    Cough 09/14/2021   Insomnia 08/08/2021   Pancytopenia ([place]) 08/08/2021   Neutropenia ([place]) 08/08/2021   Sinus tachycardia    Encephalitis 07/22/2021   Debility 07/20/2021   Hypokalemia 07/19/2021   Hyponatremia 07/18/2021   Malnutrition of moderate degree 07/15/2021   FTT (failure to thrive) in adult 07/13/2021   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   CVA (cerebral vascular accident) ([place]) 07/08/2021   Rash of mouth present on examination 03/25/2021   Need for prophylactic vaccination and inoculation against smallpox 03/25/2021   Numbness and tingling of right arm 03/24/2021   AIDS (acquired immune deficiency syndrome) ([place]) 03/05/2021   Cocaine-induced mood disorder ([place])    Tinea pedis 08/06/2019   At risk for opportunistic infections 07/14/2019   AKI (acute kidney injury) ([place]) 06/02/2019   Generalized weakness 06/02/2019   Major depressive disorder, 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   Substance induced mood disorder ([place]) 09/16/2015   Cocaine use disorder, severe, dependence ([place])    Cannabis use disorder, severe, dependence ([place]) 04/09/2015   Severe recurrent major depression without  psychotic features ([place]) 04/08/2015   Alcohol abuse 04/08/2015   Screening examination for venereal disease 03/25/2015   Encounter for long-term (current) use of medications 03/25/2015   Tobacco abuse    Visual disturbance 11/18/2013   Atopic dermatitis 10/30/2013    Palliative Care Assessment & Plan   HPI: 46 year old gentleman with advanced AIDS, disseminated MAC, substance use, depression prior stroke with residual right-sided weakness, history of pulmonary embolism, admitted with cough and palpitations.  CT scan concerning for possible lung mass, underwent bronchoscopy, infectious disease following.  Has right upper lobe mass.  Bronchoscopy brushings negative, consideration was being given to pursuing biopsy however plan is to proceed with outpatient PET scan.  Hospital course complicated by SIRS, patient off-and-on spiking fevers.  Remains on azithromycin. Palliative medicine team following for CODE STATUS and broad goals of care discussions  Assessment: Goals continue for full code/full scope care  Recommendations/Plan: Nausea/anorexia/cachexia: continue marinol 5 mg BID.  He feels Marinol has been somewhat beneficial in improving his appetite. Pain: We will rotate from morphine IR to oxycodone 5 to 10 mg every 4 hours as needed.  Also left backup medication of IV morphine to be used 45 minutes after oral medication if oral medication is ineffective to relieve his pain.  He has been on and tolerated oxycodone well in the past. Full code full scope  Goals of Care and Additional Recommendations: Limitations on Scope of Treatment: Full Scope Treatment  Code Status: Full code  Discharge Planning: To Be Determined  Care plan was discussed with patient, RN  Thank you for allowing the Palliative Medicine Team to assist in the care of this patient.  Total time: 51 minutes  Micheline Rough, MD [place] Team [phone]

## 2021-11-26 NOTE — Progress Notes (Signed)
Assisted patient to [facility], patient had large mushy, mucusy bowel movement.  Patient denies pain, nausea.  As with yesterday, patient noted to have seemingly unprovoked episode of SOB, gasping for air, lasting about 10 seconds.  Patient reports feeling "scared that I'm gonna choke."  Patient requested to leave door open in case he cannot breathe and requires attention.  Patient's urine more amber colored today, versus pink-tinged yesterday.  Bradd Burner, RN

## 2021-11-27 DIAGNOSIS — B20 Human immunodeficiency virus [HIV] disease: Secondary | ICD-10-CM | POA: Diagnosis not present

## 2021-11-27 DIAGNOSIS — G8929 Other chronic pain: Secondary | ICD-10-CM | POA: Diagnosis not present

## 2021-11-27 DIAGNOSIS — R7989 Other specified abnormal findings of blood chemistry: Secondary | ICD-10-CM | POA: Diagnosis not present

## 2021-11-27 DIAGNOSIS — Z515 Encounter for palliative care: Secondary | ICD-10-CM | POA: Diagnosis not present

## 2021-11-27 DIAGNOSIS — Z8673 Personal history of transient ischemic attack (TIA), and cerebral infarction without residual deficits: Secondary | ICD-10-CM | POA: Diagnosis not present

## 2021-11-27 DIAGNOSIS — R509 Fever, unspecified: Secondary | ICD-10-CM | POA: Diagnosis not present

## 2021-11-27 DIAGNOSIS — R6510 Systemic inflammatory response syndrome (SIRS) of non-infectious origin without acute organ dysfunction: Secondary | ICD-10-CM | POA: Diagnosis not present

## 2021-11-27 DIAGNOSIS — R531 Weakness: Secondary | ICD-10-CM | POA: Diagnosis not present

## 2021-11-27 LAB — CBC WITH DIFFERENTIAL/PLATELET
Abs Immature Granulocytes: 0.29 10*3/uL — ABNORMAL HIGH (ref 0.00–0.07)
Basophils Absolute: 0 10*3/uL (ref 0.0–0.1)
Basophils Relative: 0 %
Eosinophils Absolute: 0.1 10*3/uL (ref 0.0–0.5)
Eosinophils Relative: 1 %
HCT: 26.6 % — ABNORMAL LOW (ref 39.0–52.0)
Hemoglobin: 7.9 g/dL — ABNORMAL LOW (ref 13.0–17.0)
Immature Granulocytes: 6 %
Lymphocytes Relative: 12 %
Lymphs Abs: 0.7 10*3/uL (ref 0.7–4.0)
MCH: 27.1 pg (ref 26.0–34.0)
MCHC: 29.7 g/dL — ABNORMAL LOW (ref 30.0–36.0)
MCV: 91.1 fL (ref 80.0–100.0)
Monocytes Absolute: 0.5 10*3/uL (ref 0.1–1.0)
Monocytes Relative: 10 %
Neutro Abs: 3.7 10*3/uL (ref 1.7–7.7)
Neutrophils Relative %: 71 %
Platelets: 445 10*3/uL — ABNORMAL HIGH (ref 150–400)
RBC: 2.92 MIL/uL — ABNORMAL LOW (ref 4.22–5.81)
RDW: 16.4 % — ABNORMAL HIGH (ref 11.5–15.5)
WBC: 5.3 10*3/uL (ref 4.0–10.5)
nRBC: 0.4 % — ABNORMAL HIGH (ref 0.0–0.2)

## 2021-11-27 LAB — COMPREHENSIVE METABOLIC PANEL
ALT: 39 U/L (ref 0–44)
AST: 47 U/L — ABNORMAL HIGH (ref 15–41)
Albumin: 2.6 g/dL — ABNORMAL LOW (ref 3.5–5.0)
Alkaline Phosphatase: 262 U/L — ABNORMAL HIGH (ref 38–126)
Anion gap: 9 (ref 5–15)
BUN: 8 mg/dL (ref 6–20)
CO2: 18 mmol/L — ABNORMAL LOW (ref 22–32)
Calcium: 9.4 mg/dL (ref 8.9–10.3)
Chloride: 106 mmol/L (ref 98–111)
Creatinine, Ser: 0.89 mg/dL (ref 0.61–1.24)
GFR, Estimated: >60 mL/min (ref >60)
Glucose, Bld: 98 mg/dL (ref 70–99)
Potassium: 4.4 mmol/L (ref 3.5–5.1)
Sodium: 133 mmol/L — ABNORMAL LOW (ref 135–145)
Total Bilirubin: 0.5 mg/dL (ref 0.3–1.2)
Total Protein: 7 g/dL (ref 6.5–8.1)

## 2021-11-27 LAB — ACID FAST SMEAR (AFB, MYCOBACTERIA)

## 2021-11-27 LAB — PHOSPHORUS: Phosphorus: 3.9 mg/dL (ref 2.5–4.6)

## 2021-11-27 LAB — MAGNESIUM: Magnesium: 2 mg/dL (ref 1.7–2.4)

## 2021-11-27 NOTE — Progress Notes (Signed)
PROGRESS NOTE    Kurt Foley  NFA:[id number] DOB: 1975/09/02 DOA: 11/09/2021 PCP: Gildardo Pounds, NP   Brief Narrative:  46 year old with advanced AIDS, disseminated MAC substance abuse, depression, prior CVA with residual right-sided weakness, pulmonary embolism on Xarelto admitted for cough and palpitations.  Repeat CT showed abnormal lung parenchyma.  He was seen by pulmonary, cultures remain negative.  Underwent bronchoscopy with EBUS on 6/5 with subcarinal lymph node sampling and brushing with BAL of right upper lobe mass.  Brushings remain negative, IR consulted for CT-guided biopsy.  Patient off-and-on spiking fever, discussed with ID and this is likely due to MAC.  Patient has also been seen by palliative care service, wishes to be full code. Unforturnately continues to spike fevers daily leading to tachycardia.   ID is now changing antibiotics and trying IV Zosyn.  Continues to have temperatures and tachycardia along with some back pain but fever seems improved this morning and ID recommends continuing current antibiotics and regimen at this time.  Palliative care is increasing his blood but now and PT OT recommending outpatient PT.  Given his recurrent fevers ID is now checking a CT of the chest abdomen pelvis to see if there is anything new that they could consider; otherwise that they are going to continue his current plan however they have changed his HIV medication from [place] to [place].  He has not changed his morphine p.o. to oxycodone.  Given his poor appetite we will start him on D5W half-normal saline at 75 MLS per hour and also given 500 mill bolus given his slight hypotension.  CT Scan of the Chest/Abd/Pelvis done and showed "Worsening nodal disease since recent imaging from May in April in the chest and abdomen though improved when compared to January. Findings may reflect either sequela of age related infection or could potentially be related to  neoplasm/lymphoproliferative disorder. Would suggest correlation with PET imaging to select a site for sampling. This could also be utilized to assess pulmonary findings that have raise concern on recent imaging. More  masslike appearance of RIGHT upper lobe changes and LEFT upper lobe nodules as discussed above, consideration for  pulmonary neoplasm as discussed on recent imaging. Signs of pulmonary emphysema. Three-vessel coronary artery disease.  Low-attenuation cardiac blood pool compatible with anemia.  Stranding about jejunal loops in the upper abdomen is of uncertain significance but is associated with increasing nodal disease, perhaps related to inflammation or lymphatic congestion. Attention on follow-up.  Bilateral perinephric stranding which was not evident on most recent abdominal imaging. Correlation with urinalysis is suggested. Aortic Atherosclerosis (ICD10-I70.0) and Emphysema."  ID recommends continuing current plan of care transitioning to oral Augmentin for 5 days at discharge.  ID feels that they do not expect any meaningful recovery but patient wishes to continue his current treatments and wants to consider physical rehabilitation.  We will consult PT OT for further evaluation recommendations   Assessment and Plan:  SIRS with recurrent fevers History of disseminated MAC infection - Seen by ID.  This is likely secondary to disseminated MAC history.  Current regimen-Biktarvy, azithromycin and ethambutol. Now on Rifabutin as well but given his interactions with drugs he was changed to Triumeq -ID feels that he has no real source of his fever but could be secondary to his disseminated MRSA infection -ID is now adding IV Zosyn for empiric treatment of occult infection and monitoring fever and fever continues to recur and continues to have high temperatures even though he had a period  where he is fever free but likely in the anti-inflammatory effects of antibiotics -ID is now ordering a CT  of the chest abdomen abdomen and pelvis to look for anything new that they can consider to treat including new atypical pneumonia, occult abscess or more lymphadenopathy -CT Scan of the Chest/Abd/Pelvis done and showed "Worsening nodal disease since recent imaging from May in April in the chest and abdomen though improved when compared to January. Findings may reflect either sequela of age related infection or could potentially be related to neoplasm/lymphoproliferative disorder. Would suggest correlation with PET imaging to select a site for sampling. This could also be utilized to assess pulmonary findings that have raise concern on recent imaging. More  masslike appearance of RIGHT upper lobe changes and LEFT upper lobe nodules as discussed above, consideration for  pulmonary neoplasm as discussed on recent imaging. Signs of pulmonary emphysema. Three-vessel coronary artery disease.  Low-attenuation cardiac blood pool compatible with anemia.  Stranding about jejunal loops in the upper abdomen is of uncertain significance but is associated with increasing nodal disease, perhaps related to inflammation or lymphatic congestion. Attention on follow-up.  Bilateral perinephric stranding which was not evident on most recent abdominal imaging. Correlation with urinalysis is suggested. Aortic Atherosclerosis (ICD10-I70.0) and Emphysema." -ID recommends continuing current plan of care as his fever has improved and will continue IV Zosyn for now and transition to oral Augmentin for 5 days at discharge but will need PT and OT to further evaluate and treat given that he is extremely weak   Sinus Tachycardia with fevers.  - Continues to spike fevers and off-and-on having tachycardia.  Continue Toprol-XL 100 mg daily.  IV fluid hydration has stopped but is now resumed at D5 half-normal saline at 75 mils per hour and will give another 500 bolus -Also on IV metoprolol as needed   Right upper lobe mass Dyspnea with  shortness of breath  -Status post bronchoscopy with EBUS on 6/5.  Brushings remain negative.  Case discussed with IR who recommends outptn PET scan. Spoke with Dr Lamonte Sakai from [facility], who also reviewed images and recommended outptn PET followed by follow up with Dr Valeta Harms for discussion of possible Navigation Bronch. There may be a component of infection which may clear with time?  -Getting a CT of the chest abdomen pelvis as above -On IV Zosyn now we will continue for now -He will need follow-up on his mass noted   AKI Metabolic acidosis - AKI has resolved resolved -Patient's BUN/creatinine is now stable at 8/0.89 -Patient's metabolic acidosis is improving slightly and he has a CO2 of 18, anion gap of 8, chloride level of 106 -Avoid nephrotoxic medications, contrast dyes, hypotension and adjust medications   Pulmonary Embolus Has a history of DVT -Transitioned back to Xarelto.   History of CVA -Residual right-sided weakness with right hand and arm contractures.  On statin -We will get PT and OT to further evaluate and treat  Emphysema -Bronchodilators to be continued.  HIV with history of MAI. -Infectious diseases following.  currently on Triumeq and ethambutol and rifabutin. On azithromycin daily as well and now going on IV Zosyn -He is on 3 drugs and they will continue and they feel it is difficult with his underlying poor immune recovery -See above  Substance use -Counseled to quit using.  Depression -Continue with on Seroquel and Remeron  Hyponatremia -Patient's sodium is now 133 -Monitor and trend and repeat CMP in a.m.   Normocytic Anemia -Patient's hemoglobin/hematocrit is now  7.7/26.9 -> 7.5/24.8 -> 8.1/27.5 -> 8.0/26.7 -> 7.9/26.6 -Trended down from 9.6/31.4 on 6/8 -Check anemia panel in a.m. -Continue monitor for signs of a bleeding; no overt bleeding noted -Repeat CBC in the a.m.  GERD -Daily PPI with Pantoprazole 40 mg po Daily   Transaminitis -Trend  LFTs -LFTs have been slightly abnormal but have now been improving and AST is now 47 and ALT 39 -Continue monitor and if necessary will obtain a right upper quadrant ultrasound   Goals of care Severe protein calorie malnutrition -Advanced HIV with very guarded prognosis.  -Palliative care following-he wishes to be full code -Supplements.  Marinol added and dose increased today given that he has extremely poor p.o. intake -Palliative care has adjusted his medication and is now on Oxycodone IR 5 to 10 mg every 4 hours as needed for moderate-severe pain -Marinol has been increased to 5 mg twice daily -See IV fluid hydration as above given his poor p.o. intake  DVT prophylaxis:  rivaroxaban (XARELTO) tablet 20 mg    Code Status: Full Code Family Communication: No family currently at bedside  Disposition Plan:  Level of care: Progressive Status is: Inpatient Remains inpatient appropriate because: Has unsafe discharge disposition currently continues to remain on IV antibiotics and continues to have significant pain   Consultants:  Infectious diseases Palliative care medicine Pulmonary Interventional radiology  Procedures:  Procedure(s): Flexible bronchoscopy (94765) Brushing (46503) of the RUL  Bronchial alveolar lavage (54656) of the RUL Endobronchial ultrasound (81275) Transbronchial needle aspiration (17001) of the station 7    He will be underwent a CT of the chest abdomen pelvis yesterday ordered by ID  Antimicrobials:  Anti-infectives (From admission, onward)    Start     Dose/Rate Route Frequency Ordered Stop   11/24/21 1000  abacavir-dolutegravir-lamiVUDine (TRIUMEQ) 600-50-300 MG per tablet 1 tablet        1 tablet Oral Daily 11/23/21 1107     11/23/21 1130  piperacillin-tazobactam (ZOSYN) IVPB 3.375 g        3.375 g 12.5 mL/hr over 240 Minutes Intravenous Every 8 hours 11/23/21 1042     11/20/21 1500  rifabutin (MYCOBUTIN) capsule 300 mg        300 mg Oral Daily  11/20/21 1341     11/20/21 1430  sulfamethoxazole-trimethoprim (BACTRIM DS) 800-160 MG per tablet 1 tablet        1 tablet Oral Daily 11/20/21 1342     11/17/21 0800  vancomycin (VANCOCIN) IVPB 1000 mg/200 mL premix  Status:  Discontinued        1,000 mg 200 mL/hr over 60 Minutes Intravenous Every 12 hours 11/16/21 1807 11/17/21 1048   11/16/21 1830  vancomycin (VANCOREADY) IVPB 1500 mg/300 mL        1,500 mg 150 mL/hr over 120 Minutes Intravenous STAT 11/16/21 1758 11/16/21 2048   11/16/21 1800  ceFEPIme (MAXIPIME) 2 g in sodium chloride 0.9 % 100 mL IVPB  Status:  Discontinued        2 g 200 mL/hr over 30 Minutes Intravenous Every 8 hours 11/16/21 1756 11/17/21 1048   11/11/21 2200  azithromycin (ZITHROMAX) tablet 500 mg        500 mg Oral Daily 11/11/21 1511     11/10/21 2200  azithromycin (ZITHROMAX) 500 mg in sodium chloride 0.9 % 250 mL IVPB  Status:  Discontinued        500 mg 250 mL/hr over 60 Minutes Intravenous Every 24 hours 11/10/21 1316 11/11/21 1511   11/10/21  1400  cefTRIAXone (ROCEPHIN) 2 g in sodium chloride 0.9 % 100 mL IVPB  Status:  Discontinued        2 g 200 mL/hr over 30 Minutes Intravenous Every 24 hours 11/10/21 1316 11/11/21 1511   11/10/21 1000  ethambutol (MYAMBUTOL) tablet 800 mg        800 mg Oral Daily 11/09/21 1840     11/10/21 1000  sulfamethoxazole-trimethoprim (BACTRIM DS) 800-160 MG per tablet 1 tablet  Status:  Discontinued        1 tablet Oral Daily 11/09/21 1840 11/10/21 1316   11/09/21 2200  azithromycin (ZITHROMAX) tablet 500 mg  Status:  Discontinued        500 mg Oral Daily at bedtime 11/09/21 1909 11/10/21 1316   11/09/21 1830  vancomycin (VANCOREADY) IVPB 1500 mg/300 mL        1,500 mg 150 mL/hr over 120 Minutes Intravenous STAT 11/09/21 1817 11/09/21 2157   11/09/21 1830  piperacillin-tazobactam (ZOSYN) IVPB 3.375 g        3.375 g 100 mL/hr over 30 Minutes Intravenous STAT 11/09/21 1817 11/09/21 1932   11/09/21 1700   bictegravir-emtricitabine-tenofovir AF (BIKTARVY) 50-200-25 MG per tablet 1 tablet  Status:  Discontinued        1 tablet Oral Daily 11/09/21 1608 11/23/21 1107       Subjective: Seen and examined at bedside and he was more awake and states that he is hurting quite a bit today.  Did not have a temperature.  Is not eating much but thinks the Marinol helps.  Appears uncomfortable.  No other concerns or complaints at this time.  Objective: Vitals:   11/27/21 0008 11/27/21 0409 11/27/21 0603 11/27/21 1241  BP: 110/83 (!) 113/98  97/70  Pulse: (!) 102 99  (!) 101  Resp: _0 Temp: 98.6 F (37 C) 99.3 F (37.4 C)  99.3 F (37.4 C)  TempSrc: Oral Oral  Oral  SpO2: 97% 97% 92% 96%  Weight:      Height:        Intake/Output Summary (Last 24 hours) at 11/27/2021 1716 Last data filed at 11/27/2021 1118 Gross per 24 hour  Intake 1203.56 ml  Output 1301 ml  Net -97.44 ml   Filed Weights   11/09/21 2040 11/18/21 0430  Weight: 67.9 kg 69.2 kg   Examination: Physical Exam:  Constitutional: Thin African-American male currently appears uncomfortable and complaining of some pain Respiratory: Diminished to auscultation bilaterally with coarse breath sounds, no wheezing, rales, rhonchi or crackles. Normal respiratory effort and patient is not tachypenic. No accessory muscle use.  Unlabored breathing Cardiovascular: RRR, no murmurs / rubs / gallops. S1 and S2 auscultated.  Has some edema  abdomen: Soft, non-tender, non-distended.  Bowel sounds positive.  GU: Deferred. Musculoskeletal: No clubbing / cyanosis of digits/nails.  Has some contractures and has a right arm contracture and hand contracture in the setting of his prior CVA Neurologic: CN 2-12 grossly intact with no focal deficits.  Romberg sign and cerebellar reflexes not assessed.  Psychiatric: Normal judgment and insight. Alert and oriented x 3.  Anxious mood  Data Reviewed: I have personally reviewed following labs and imaging  studies  CBC: Recent Labs  Lab 11/23/21 1029 11/24/21 0455 11/25/21 0439 11/26/21 0530 11/27/21 0505  WBC 6.1 4.4 6.8 5.8 5.3  NEUTROABS 4.1  --  4.8 4.4 3.7  HGB 7.7* 7.5* 8.1* 8.0* 7.9*  HCT 26.9* 24.8* 27.5* 26.7* 26.6*  MCV 95.4 90.5 92.0 90.8  91.1  PLT 301 369 422* 466* 443*   Basic Metabolic Panel: Recent Labs  Lab 11/23/21 1029 11/24/21 0455 11/25/21 0439 11/26/21 0530 11/27/21 0505  NA 136 141 139 136 133*  K 4.2 3.7 4.4 4.3 4.4  CL 112* 115* 108 108 106  CO2 15* 18* 20* 19* 18*  GLUCOSE 82 122* 88 95 98  BUN _0 CREATININE 0.90 1.05 0.96 0.88 0.89  CALCIUM 8.9 9.2 9.3 9.4 9.4  MG 1.8 2.0 1.9 2.1 2.0  PHOS 4.2  --  5.0* 4.4 3.9   GFR: Estimated Creatinine Clearance: 92.6 mL/min (by C-G formula based on SCr of 0.89 mg/dL). Liver Function Tests: Recent Labs  Lab 11/21/21 0431 11/23/21 1029 11/25/21 0439 11/26/21 0530 11/27/21 0505  AST 54* 56* 45* 40 47*  ALT 54* 46* 41 37 39  ALKPHOS 230* 259* 311* 275* 262*  BILITOT 0.7 0.8 0.6 0.3 0.5  PROT 7.3 6.6 7.4 7.3 7.0  ALBUMIN 2.8* 2.6* 2.6* 2.6* 2.6*   No results for input(s): "LIPASE", "AMYLASE" in the last 168 hours. No results for input(s): "AMMONIA" in the last 168 hours. Coagulation Profile: No results for input(s): "INR", "PROTIME" in the last 168 hours. Cardiac Enzymes: No results for input(s): "CKTOTAL", "CKMB", "CKMBINDEX", "TROPONINI" in the last 168 hours. BNP (last 3 results) No results for input(s): "PROBNP" in the last 8760 hours. HbA1C: No results for input(s): "HGBA1C" in the last 72 hours. CBG: Recent Labs  Lab 11/23/21 0716 11/25/21 2105  GLUCAP 115* 106*   Lipid Profile: No results for input(s): "CHOL", "HDL", "LDLCALC", "TRIG", "CHOLHDL", "LDLDIRECT" in the last 72 hours. Thyroid Function Tests: No results for input(s): "TSH", "T4TOTAL", "FREET4", "T3FREE", "THYROIDAB" in the last 72 hours. Anemia Panel: No results for input(s): "VITAMINB12", "FOLATE",  "FERRITIN", "TIBC", "IRON", "RETICCTPCT" in the last 72 hours. Sepsis Labs: No results for input(s): "PROCALCITON", "LATICACIDVEN" in the last 168 hours.  Recent Results (from the past 240 hour(s))  Culture, blood (Routine X 2) w Reflex to ID Panel     Status: None   Collection Time: 11/21/21  4:18 PM   Specimen: BLOOD  Result Value Ref Range Status   Specimen Description   Final    BLOOD BLOOD LEFT FOREARM Performed at [facility] [address], [place], [place] [address]    Special Requests   Final    BOTTLES DRAWN AEROBIC ONLY Blood Culture results may not be optimal due to an inadequate volume of blood received in culture bottles Performed at [facility] [address], [place], [place] [address]    Culture   Final    NO GROWTH 5 DAYS Performed at [facility], [place] [address], [place], [place] [address]    Report Status 11/26/2021 FINAL  Final  Culture, blood (Routine X 2) w Reflex to ID Panel     Status: None   Collection Time: 11/21/21  4:18 PM   Specimen: BLOOD  Result Value Ref Range Status   Specimen Description   Final    BLOOD BLOOD LEFT WRIST Performed at [facility] [address], [place], [place] [address]    Special Requests   Final    IN PEDIATRIC BOTTLE Blood Culture adequate volume Performed at [facility] [address], [place], [place] [address]    Culture   Final    NO GROWTH 5 DAYS Performed at [facility], [place] [address]  [place], [place] [address]    Report Status 11/26/2021 FINAL  Final    Radiology Studies: No results found.  Scheduled Meds:  abacavir-dolutegravir-lamiVUDine  1 tablet Oral Daily   azithromycin  500 mg Oral Daily   B-complex with vitamin C  1 tablet Oral Daily   dronabinol  5 mg Oral BID AC   ethambutol  800 mg Oral Daily   feeding supplement  237 mL Oral TID BM   ketoconazole   Topical BID   metoprolol succinate  100 mg Oral  q morning   mirtazapine  15 mg Oral QHS   mometasone-formoterol  2 puff Inhalation BID   pantoprazole  40 mg Oral QAC breakfast   QUEtiapine  200 mg Oral QHS   rifabutin  300 mg Oral Daily   rivaroxaban  20 mg Oral Q supper   rosuvastatin  5 mg Oral q morning   sodium chloride flush  3 mL Intravenous Q12H   sulfamethoxazole-trimethoprim  1 tablet Oral Daily   umeclidinium bromide  1 puff Inhalation Daily   Continuous Infusions:  piperacillin-tazobactam (ZOSYN)  IV Stopped (11/27/21 1654)    LOS: 17 days   Raiford Noble, DO [facility] Available via Epic secure chat 7am-7pm After these hours, please refer to coverage provider listed on [email or web address] 11/27/2021, 5:16 PM

## 2021-11-27 NOTE — Progress Notes (Signed)
Daily Progress Note   Patient Name: Kurt Foley       Date: 11/27/2021 DOB: [birth year]  Age: 46 y.o. MRN#: [id number] Attending Physician: Kerney Elbe, DO Primary Care Physician: Gildardo Pounds, NP Admit Date: 11/09/2021  Reason for Consultation/Follow-up: Establishing goals of care  Subjective: I saw and examined Kurt Foley.  He was lying in bed in no distress time my encounter.  We again discussed pain management.  He states that his pain has been better controlled since starting on the oxycodone and he thinks that it works better than previously ordered oral morphine.  MAR reviewed and he has used 3 doses 10 mg in the last 24 hours.  He has also had 1 rescue dose of IV morphine for a total oral morphine equivalent of 96 mg of oral morphine.  Discussed again regarding.  He does think his appetite is improving little by little.  He would like to continue trial to see if it continues to improve.  Length of Stay: 17  Current Medications: Scheduled Meds:   abacavir-dolutegravir-lamiVUDine  1 tablet Oral Daily   azithromycin  500 mg Oral Daily   B-complex with vitamin C  1 tablet Oral Daily   dronabinol  5 mg Oral BID AC   ethambutol  800 mg Oral Daily   feeding supplement  237 mL Oral TID BM   ketoconazole   Topical BID   metoprolol succinate  100 mg Oral q morning   mirtazapine  15 mg Oral QHS   mometasone-formoterol  2 puff Inhalation BID   pantoprazole  40 mg Oral QAC breakfast   QUEtiapine  200 mg Oral QHS   rifabutin  300 mg Oral Daily   rivaroxaban  20 mg Oral Q supper   rosuvastatin  5 mg Oral q morning   sodium chloride flush  3 mL Intravenous Q12H   sulfamethoxazole-trimethoprim  1 tablet Oral Daily   umeclidinium bromide  1 puff Inhalation Daily    Continuous  Infusions:  piperacillin-tazobactam (ZOSYN)  IV 3.375 g (11/27/21 1251)    PRN Meds: acetaminophen (TYLENOL) oral liquid 160 mg/5 mL, acetaminophen **OR** acetaminophen, baclofen, chlorpheniramine-HYDROcodone, guaiFENesin-dextromethorphan, hydrALAZINE, ibuprofen, ipratropium-albuterol, metoprolol tartrate, morphine injection, oxyCODONE, polyethylene glycol, senna-docusate  Physical Exam Constitutional:      General: He is not in acute distress.    Appearance: He is ill-appearing.  Pulmonary:     Effort: Pulmonary effort is normal.  Skin:    General: Skin is warm and dry.  Neurological:     Mental Status: He is alert and oriented to person, place, and time.             Vital Signs: BP 97/70 (BP Location: Left Arm)   Pulse (!) 101   Temp 99.3 F (37.4 C) (Oral)   Resp 16   Ht _0  (1.676 m)   Wt 69.2 kg   SpO2 96%   BMI 24.62 kg/m  SpO2: SpO2: 96 % O2 Device: O2 Device: Room Air O2 Flow Rate: O2 Flow Rate (L/min): 2 L/min  Intake/output summary:  Intake/Output Summary (Last 24 hours) at 11/27/2021 1429 Last data filed at 11/27/2021 1118 Gross per 24 hour  Intake 1203.56 ml  Output 1301 ml  Net -97.44 ml    LBM: Last BM Date : 11/26/21 Baseline Weight: Weight: 67.9 kg Most recent weight: Weight: 69.2 kg       Palliative Assessment/Data: PPS 40%      Patient Active Problem List   Diagnosis Date Noted   Cocaine abuse ([place]) 11/25/2018   Mass of upper lobe of right lung 10/06/2021   Tinea corporis and facialis 10/07/2021   Abnormal LFTs 08/08/2021   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[place]) 03/25/2021   Emphysema, unspecified ([place]) 03/05/2021   Human immunodeficiency virus (HIV) disease ([place]) 10/30/2013   Pulmonary embolism ([place]) 10/29/2021   History of CVA with residual right hemiparesis 05/25/2021   Dyspnea 10/06/2021   Goals of care, counseling/discussion    General weakness    Encounter for palliative care    Mediastinal lymphadenopathy     Pneumonia 11/10/2021   SIRS (systemic inflammatory response syndrome) ([place]) 11/09/2021   Acute respiratory infection    Tinea barbae    Cough 09/14/2021   Insomnia 08/08/2021   Pancytopenia ([place]) 08/08/2021   Neutropenia ([place]) 08/08/2021   Sinus tachycardia    Encephalitis 07/22/2021   Debility 07/20/2021   Hypokalemia 07/19/2021   Hyponatremia 07/18/2021   Malnutrition of moderate degree 07/15/2021   FTT (failure to thrive) in adult 07/13/2021   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   CVA (cerebral vascular accident) ([place]) 07/08/2021   Rash of mouth present on examination 03/25/2021   Need for prophylactic vaccination and inoculation against smallpox 03/25/2021   Numbness and tingling of right arm 03/24/2021   AIDS (acquired immune deficiency syndrome) ([place]) 03/05/2021   Cocaine-induced mood disorder ([place])    Tinea pedis 08/06/2019   At risk for opportunistic infections 07/14/2019   AKI (acute kidney injury) ([place]) 06/02/2019   Generalized weakness 06/02/2019   Major depressive disorder, recurrent severe without psychotic features ([place]) 11/25/2018   Substance induced mood disorder ([place]) 09/16/2015   Cocaine use disorder, severe, dependence ([place])    Cannabis use disorder, severe, dependence ([place]) 04/09/2015   Severe recurrent major depression without psychotic features ([place]) 04/08/2015   Alcohol abuse 04/08/2015   Screening examination for venereal disease 03/25/2015   Encounter for long-term (current) use of medications 03/25/2015   Tobacco abuse    Visual disturbance 11/18/2013   Atopic dermatitis 10/30/2013    Palliative Care Assessment & Plan   HPI: 46 year old gentleman with advanced AIDS, disseminated MAC, substance use, depression prior stroke with residual right-sided weakness, history of pulmonary embolism, admitted with cough and palpitations.  CT scan concerning for possible lung mass, underwent bronchoscopy, infectious disease  following.  Has right upper lobe mass.  Bronchoscopy brushings negative, consideration was being given to pursuing biopsy however plan is to proceed with outpatient PET scan.  Hospital course complicated by SIRS, patient off-and-on spiking fevers.  Remains on azithromycin. Palliative medicine team following for CODE STATUS and broad goals of care discussions  Assessment: Goals continue for full code/full scope care  Recommendations/Plan: Nausea/anorexia/cachexia: continue marinol 5 mg BID.  He feels Marinol has been somewhat beneficial in improving his appetite. Pain: Continue oxycodone 5 to 10 mg every 4 hours as needed for breakthrough pain.  Also left backup medication of IV morphine to be used 45 minutes after oral medication if oral medication is ineffective to relieve his pain.  Full code full scope  Goals of Care and Additional Recommendations: Limitations on Scope of Treatment: Full Scope Treatment  Code Status: Full code  Discharge Planning:  To Be Determined  Care plan was discussed with patient, RN  Thank you for allowing the Palliative Medicine Team to assist in the care of this patient.   Micheline Rough, MD [place] Team [phone]

## 2021-11-27 NOTE — Plan of Care (Signed)
  Problem: Education: Goal: Knowledge of General Education information will improve Description Including pain rating scale, medication(s)/side effects and non-pharmacologic comfort measures Outcome: Progressing   

## 2021-11-27 NOTE — Progress Notes (Signed)
[facility]   Reason for visit: Follow up on fever  Interval History: he remains afebrile now; WBC 5.3.  CT scan with no new findings.  Tearful.     Physical Exam: Constitutional:  Vitals:   11/27/21 0603 11/27/21 1241  BP:  97/70  Pulse:  (!) 101  Resp:  16  Temp:  99.3 F (37.4 C)  SpO2: 92% 96%   patient appears cachectic, fatigued Respiratory: Normal respiratory effort Cardiovascular: tachy RR   Review of Systems: Constitutional: negative for fevers and chills Gastrointestinal: negative for nausea and diarrhea  Lab Results  Component Value Date   WBC 5.3 11/27/2021   HGB 7.9 (L) 11/27/2021   HCT 26.6 (L) 11/27/2021   MCV 91.1 11/27/2021   PLT 445 (H) 11/27/2021    Lab Results  Component Value Date   CREATININE 0.89 11/27/2021   BUN 8 11/27/2021   NA 133 (L) 11/27/2021   K 4.4 11/27/2021   CL 106 11/27/2021   CO2 18 (L) 11/27/2021    Lab Results  Component Value Date   ALT 39 11/27/2021   AST 47 (H) 11/27/2021   ALKPHOS 262 (H) 11/27/2021     Microbiology: Recent Results (from the past 240 hour(s))  Culture, blood (Routine X 2) w Reflex to ID Panel     Status: None   Collection Time: 11/21/21  4:18 PM   Specimen: BLOOD  Result Value Ref Range Status   Specimen Description   Final    BLOOD BLOOD LEFT FOREARM Performed at [facility], [address], [place], [place] [address]    Special Requests   Final    BOTTLES DRAWN AEROBIC ONLY Blood Culture results may not be optimal due to an inadequate volume of blood received in culture bottles Performed at [facility], [address], [place], [place] [address]    Culture   Final    NO GROWTH 5 DAYS Performed at [facility], [address], [place], [place] [address]    Report Status 11/26/2021 FINAL  Final  Culture, blood (Routine X 2) w Reflex to ID Panel     Status: None   Collection Time: 11/21/21  4:18 PM   Specimen:  BLOOD  Result Value Ref Range Status   Specimen Description   Final    BLOOD BLOOD LEFT WRIST Performed at [facility], [address], [place], [place] [address]    Special Requests   Final    IN PEDIATRIC BOTTLE Blood Culture adequate volume Performed at [facility], [address], [place], [place] [address]    Culture   Final    NO GROWTH 5 DAYS Performed at [facility], [address], [place], [place] [address]    Report Status 11/26/2021 FINAL  Final    Impression/Plan:  1. Fever - seems to have resolved with empiric piperacillin/tazobactam.  I recommend he continue with this then transition to oral Augmentin for 5 days at discharge.    2.  HIV - on ARVs and tolerating.  Poor immune reconstitution overall.    3.  Disposition - I again discussed with him his current condition and that I do not expect any meaningful recovery.  He is tearful, tired and continues to feel discomfort overall.  I again recommend he consider a more palliative course but he wants to continue with his current treatments and wants to consider physical rehab.

## 2021-11-28 DIAGNOSIS — Z87891 Personal history of nicotine dependence: Secondary | ICD-10-CM

## 2021-11-28 DIAGNOSIS — R1312 Dysphagia, oropharyngeal phase: Secondary | ICD-10-CM

## 2021-11-28 DIAGNOSIS — Z8673 Personal history of transient ischemic attack (TIA), and cerebral infarction without residual deficits: Secondary | ICD-10-CM | POA: Diagnosis not present

## 2021-11-28 DIAGNOSIS — J189 Pneumonia, unspecified organism: Secondary | ICD-10-CM

## 2021-11-28 DIAGNOSIS — R509 Fever, unspecified: Secondary | ICD-10-CM | POA: Diagnosis not present

## 2021-11-28 DIAGNOSIS — R7989 Other specified abnormal findings of blood chemistry: Secondary | ICD-10-CM | POA: Diagnosis not present

## 2021-11-28 DIAGNOSIS — R000 Tachycardia, unspecified: Secondary | ICD-10-CM

## 2021-11-28 DIAGNOSIS — R531 Weakness: Secondary | ICD-10-CM | POA: Diagnosis not present

## 2021-11-28 DIAGNOSIS — B20 Human immunodeficiency virus [HIV] disease: Secondary | ICD-10-CM | POA: Diagnosis not present

## 2021-11-28 DIAGNOSIS — R6510 Systemic inflammatory response syndrome (SIRS) of non-infectious origin without acute organ dysfunction: Secondary | ICD-10-CM | POA: Diagnosis not present

## 2021-11-28 LAB — CBC WITH DIFFERENTIAL/PLATELET
Abs Immature Granulocytes: 0.36 10*3/uL — ABNORMAL HIGH (ref 0.00–0.07)
Basophils Absolute: 0 10*3/uL (ref 0.0–0.1)
Basophils Relative: 0 %
Eosinophils Absolute: 0.1 10*3/uL (ref 0.0–0.5)
Eosinophils Relative: 1 %
HCT: 25.9 % — ABNORMAL LOW (ref 39.0–52.0)
Hemoglobin: 7.9 g/dL — ABNORMAL LOW (ref 13.0–17.0)
Immature Granulocytes: 6 %
Lymphocytes Relative: 12 %
Lymphs Abs: 0.8 10*3/uL (ref 0.7–4.0)
MCH: 27.1 pg (ref 26.0–34.0)
MCHC: 30.5 g/dL (ref 30.0–36.0)
MCV: 89 fL (ref 80.0–100.0)
Monocytes Absolute: 0.7 10*3/uL (ref 0.1–1.0)
Monocytes Relative: 11 %
Neutro Abs: 4.7 10*3/uL (ref 1.7–7.7)
Neutrophils Relative %: 70 %
Platelets: 474 10*3/uL — ABNORMAL HIGH (ref 150–400)
RBC: 2.91 MIL/uL — ABNORMAL LOW (ref 4.22–5.81)
RDW: 16.1 % — ABNORMAL HIGH (ref 11.5–15.5)
WBC: 6.6 10*3/uL (ref 4.0–10.5)
nRBC: 0 % (ref 0.0–0.2)

## 2021-11-28 LAB — COMPREHENSIVE METABOLIC PANEL
ALT: 43 U/L (ref 0–44)
AST: 52 U/L — ABNORMAL HIGH (ref 15–41)
Albumin: 2.6 g/dL — ABNORMAL LOW (ref 3.5–5.0)
Alkaline Phosphatase: 273 U/L — ABNORMAL HIGH (ref 38–126)
Anion gap: 12 (ref 5–15)
BUN: 10 mg/dL (ref 6–20)
CO2: 19 mmol/L — ABNORMAL LOW (ref 22–32)
Calcium: 10 mg/dL (ref 8.9–10.3)
Chloride: 105 mmol/L (ref 98–111)
Creatinine, Ser: 0.86 mg/dL (ref 0.61–1.24)
GFR, Estimated: >60 mL/min (ref >60)
Glucose, Bld: 94 mg/dL (ref 70–99)
Potassium: 4.3 mmol/L (ref 3.5–5.1)
Sodium: 136 mmol/L (ref 135–145)
Total Bilirubin: 0.7 mg/dL (ref 0.3–1.2)
Total Protein: 7.5 g/dL (ref 6.5–8.1)

## 2021-11-28 LAB — MAGNESIUM: Magnesium: 2.1 mg/dL (ref 1.7–2.4)

## 2021-11-28 LAB — PHOSPHORUS: Phosphorus: 4.6 mg/dL (ref 2.5–4.6)

## 2021-11-28 MED ORDER — FOSTEMSAVIR TROMETHAMINE ER 600 MG PO TB12
600.0000 mg | ORAL_TABLET | Freq: Two times a day (BID) | ORAL | Status: DC
  Administered 2021-11-28 – 2021-12-07 (×18): 600 mg via ORAL
  Filled 2021-11-28 (×19): qty 1

## 2021-11-28 MED ORDER — SODIUM CHLORIDE 0.9 % IV SOLN
50.0000 mg | INTRAVENOUS | Status: DC
  Administered 2021-11-28: 50 mg via INTRAVENOUS
  Filled 2021-11-28: qty 2.5

## 2021-11-28 NOTE — Plan of Care (Signed)
  Problem: Activity: Goal: Risk for activity intolerance will decrease Outcome: Progressing   Problem: Nutrition: Goal: Adequate nutrition will be maintained Outcome: Progressing   Problem: Coping: Goal: Level of anxiety will decrease Outcome: Progressing   

## 2021-11-28 NOTE — Evaluation (Signed)
Occupational Therapy Evaluation Patient Details Name: Kurt Foley MRN: [id number] DOB: 25-Jun-1975 Today's Date: 11/28/2021   History of Present Illness 46 yo male admitted with SIRS. [name] hx of PE/DVT. Hx of CVA 12/22, 1/23 with R residual weakness, HIV/AIDS, R lung mass, depression, drug/ETOH use.Underwent bronchoscopy with EBUS on 6/5 with subcarinal lymph node sampling and brushing with BAL of right upper lobe mass.   Clinical Impression   Patient is a 46 year old male who was admitted for above. Patient lives at home with family support for ADLs. Patient was noted to have decreased functional activity tolernace, decreased ROM, decreased endurance,  decreased standing balanced, decreased safety awareness, and decreased knowledge of AE/AD impacting participation in ADLs. Patient would continue to benefit from skilled OT services at this time while admitted and after d/c to address noted deficits in order to improve overall safety and independence in ADLs.         Recommendations for follow up therapy are one component of a multi-disciplinary discharge planning process, led by the attending physician.  Recommendations may be updated based on patient status, additional functional criteria and insurance authorization.   Follow Up Recommendations  Outpatient OT    Assistance Recommended at Discharge    Patient can return home with the following A little help with bathing/dressing/bathroom;Assistance with cooking/housework;Direct supervision/assist for financial management;Assist for transportation;Help with stairs or ramp for entrance;Direct supervision/assist for medications management    Functional Status Assessment  Patient has had a recent decline in their functional status and demonstrates the ability to make significant improvements in function in a reasonable and predictable amount of time.  Equipment Recommendations  None recommended by OT    Recommendations for Other  Services       Precautions / Restrictions Precautions Precautions: Fall Precaution Comments: R residual weakness UE/LE; R foot AFO Restrictions Weight Bearing Restrictions: No      Mobility Bed Mobility Overal bed mobility: Needs Assistance Bed Mobility: Sit to Supine       Sit to supine: Min assist   General bed mobility comments: Pt required min assist to bring BLE up into bed.    Transfers                          Balance Overall balance assessment: Mild deficits observed, not formally tested                                         ADL either performed or assessed with clinical judgement   ADL Overall ADL's : Needs assistance/impaired Eating/Feeding: Set up;Sitting   Grooming: Set up;Sitting   Upper Body Bathing: Moderate assistance;Sitting   Lower Body Bathing: Moderate assistance;Sit to/from stand;Sitting/lateral leans   Upper Body Dressing : Moderate assistance;Sitting   Lower Body Dressing: Sit to/from stand;Moderate assistance   Toilet Transfer: Magazine features editor Details (indicate cue type and reason): hemi walker and AFO and shoes in place. with increased time. Toileting- Water quality scientist and Hygiene: Min guard;Sit to/from stand;Sitting/lateral lean Toileting - Clothing Manipulation Details (indicate cue type and reason): simulated     Functional mobility during ADLs: Min guard General ADL Comments: with hemi walker in the hallway with good self monitoring. patient noted to have HR increase to 115-117 bpm with functional moblity. patient reported this is typical for him     Vision Patient Visual Report: No  change from baseline       Perception     Praxis      Pertinent Vitals/Pain Pain Assessment Pain Assessment: No/denies pain     Hand Dominance Right   Extremity/Trunk Assessment Upper Extremity Assessment Upper Extremity Assessment: RUE deficits/detail RUE Deficits / Details: no functional use  of R UE; R UE contracted, has resting hand splint at home that patient reported he has not worn in a while.   Lower Extremity Assessment Lower Extremity Assessment: Defer to PT evaluation   Cervical / Trunk Assessment Cervical / Trunk Assessment: Normal   Communication Communication Communication: Expressive difficulties   Cognition Arousal/Alertness: Awake/alert Behavior During Therapy: WFL for tasks assessed/performed Overall Cognitive Status: Within Functional Limits for tasks assessed                                       General Comments       Exercises     Shoulder Instructions      Home Living Family/patient expects to be discharged to:: Private residence Living Arrangements: Other relatives (grandmother and aunt) Available Help at Discharge: Family;Available 24 hours/day Type of Home: House Home Access: Stairs to enter [employer] of Steps: curb Entrance Stairs-Rails: Right Home Layout: One level     Bathroom Shower/Tub: Teacher, early years/pre: Standard Bathroom Accessibility: No   Home Equipment: Conservation officer, nature (2 wheels);Cane - single point;Shower seat   Additional Comments: Hemi-walker; AFO, resting hand splint R hand      Prior Functioning/Environment Prior Level of Function : Needs assist       Physical Assist : ADLs (physical)   ADLs (physical): Bathing;Dressing;Toileting Mobility Comments: Uses hemi-walker - walks in house, to car; limited community ADLs Comments: Pt has help with ADLs - dressing and bathing        OT Problem List: Decreased activity tolerance;Impaired balance (sitting and/or standing);Decreased safety awareness;Decreased knowledge of precautions;Decreased knowledge of use of DME or AE;Impaired UE functional use      OT Treatment/Interventions: Self-care/ADL training;Therapeutic exercise;Neuromuscular education;Energy conservation;DME and/or AE instruction;Therapeutic activities;Balance  training;Patient/family education    OT Goals(Current goals can be found in the care plan section) Acute Rehab OT Goals Patient Stated Goal: to walk with PT OT Goal Formulation: With patient Time For Goal Achievement: 12/12/21 Potential to Achieve Goals: Good  OT Frequency: Min 1X/week    Co-evaluation              AM-PAC OT "6 Clicks" Daily Activity     Outcome Measure Help from another person eating meals?: A Little Help from another person taking care of personal grooming?: A Little Help from another person toileting, which includes using toliet, bedpan, or urinal?: A Little Help from another person bathing (including washing, rinsing, drying)?: A Lot Help from another person to put on and taking off regular upper body clothing?: A Lot Help from another person to put on and taking off regular lower body clothing?: A Lot 6 Click Score: 15   End of Session Equipment Utilized During Treatment: Gait belt Nurse Communication: Mobility status  Activity Tolerance: Patient tolerated treatment well Patient left: in bed;with call bell/phone within reach  OT Visit Diagnosis: Unsteadiness on feet (R26.81);Other abnormalities of gait and mobility (R26.89)                Time: 1331-1411 OT Time Calculation (min): 40 min Charges:  OT General Charges $  OT Visit: 1 Visit OT Evaluation $OT Eval Low Complexity: 1 Low OT Treatments $Self Care/Home Management : 23-37 mins  Jackelyn Poling OTR/L, MS Acute Rehabilitation Department Office# [phone] Pager# [phone]   Marcellina Millin 11/28/2021, 3:08 PM

## 2021-11-28 NOTE — Progress Notes (Signed)
Physical Therapy Treatment Patient Details Name: Kurt Foley MRN: [id number] DOB: 14-Oct-1975 Today's Date: 11/28/2021   History of Present Illness 46 yo male admitted with SIRS. [name] hx of PE/DVT. Hx of CVA 12/22, 1/23 with R residual weakness, HIV/AIDS, R lung mass, depression, drug/ETOH use.Underwent bronchoscopy with EBUS on 6/5 with subcarinal lymph node sampling and brushing with BAL of right upper lobe mass.    PT Comments    Scheduled visit with pain medication administration and arrived to find pt supine in bed, agreeable to be seen. Pt donned shoes and R AFO for ambulation, pt required assist to complete donning of shoes. Supervision for transfers, min guard for ambulation in hallway with hemi-walker, recliner follow for safety, ~27f, one seated rest break with VSS throughout. Pt would benefit from additional ambulation outside of therapy sessions if possible. We will continue to follow the pt acutely.    Recommendations for follow up therapy are one component of a multi-disciplinary discharge planning process, led by the attending physician.  Recommendations may be updated based on patient status, additional functional criteria and insurance authorization.  Follow Up Recommendations  Outpatient PT     Assistance Recommended at Discharge PRN  Patient can return home with the following A little help with walking and/or transfers;Help with stairs or ramp for entrance;A little help with bathing/dressing/bathroom;Assistance with cooking/housework   Equipment Recommendations  None recommended by PT    Recommendations for Other Services       Precautions / Restrictions Precautions Precautions: Fall Precaution Comments: R residual weakness UE/LE; R foot AFO Restrictions Weight Bearing Restrictions: No     Mobility  Bed Mobility Overal bed mobility: Needs Assistance Bed Mobility: Sit to Supine     Supine to sit: Supervision     General bed mobility comments:  Supervision for safety only, no physical assist required    Transfers Overall transfer level: Needs assistance Equipment used: Hemi-walker Transfers: Sit to/from Stand Sit to Stand: Supervision           General transfer comment: Supv for safety, lines    Ambulation/Gait Ambulation/Gait assistance: Supervision Gait Distance (Feet): 60 Feet Assistive device: Hemi-walker (AFO) Gait Pattern/deviations: Decreased stride length, Step-to pattern, Decreased weight shift to right Gait velocity: decreased     General Gait Details: Supervision for ambulation with RW in hallway, 648f recliner follow for safety, one seated rest break after 4091fNo LOB with hemiwalker use. Good clearance of R foot with AFO use. Slow but steady gait. VSS throughout.   Stairs             Wheelchair Mobility    Modified Rankin (Stroke Patients Only)       Balance Overall balance assessment: Mild deficits observed, not formally tested                                          Cognition Arousal/Alertness: Awake/alert Behavior During Therapy: WFL for tasks assessed/performed Overall Cognitive Status: Within Functional Limits for tasks assessed                                          Exercises      General Comments        Pertinent Vitals/Pain      Home Living Family/patient expects to be discharged  to:: Private residence Living Arrangements: Other relatives (grandmother and aunt) Available Help at Discharge: Family;Available 24 hours/day Type of Home: House Home Access: Stairs to enter Entrance Stairs-Rails: Right Entrance Stairs-Number of Steps: curb   Home Layout: One level Home Equipment: Conservation officer, nature (2 wheels);Cane - single point;Shower seat Additional Comments: Hemi-walker; AFO, resting hand splint R hand    Prior Function            PT Goals (current goals can now be found in the care plan section) Acute Rehab PT  Goals Patient Stated Goal: to be able to mobilize more, return to outpatient PT PT Goal Formulation: With patient Time For Goal Achievement: 12/12/21 Potential to Achieve Goals: Good Progress towards PT goals: Progressing toward goals    Frequency    Min 3X/week      PT Plan Current plan remains appropriate    Co-evaluation              AM-PAC PT "6 Clicks" Mobility   Outcome Measure  Help needed turning from your back to your side while in a flat bed without using bedrails?: None Help needed moving from lying on your back to sitting on the side of a flat bed without using bedrails?: A Little Help needed moving to and from a bed to a chair (including a wheelchair)?: A Little Help needed standing up from a chair using your arms (e.g., wheelchair or bedside chair)?: A Little Help needed to walk in hospital room?: A Little Help needed climbing 3-5 steps with a railing? : A Lot 6 Click Score: 18    End of Session Equipment Utilized During Treatment: Gait belt Activity Tolerance: Patient limited by fatigue Patient left: with call bell/phone within reach;in bed;with bed alarm set Nurse Communication: Mobility status PT Visit Diagnosis: Difficulty in walking, not elsewhere classified (R26.2);Other symptoms and signs involving the nervous system (V40.086)     Time: 7619-5093 PT Time Calculation (min) (ACUTE ONLY): 25 min  Charges:  $Gait Training: 23-37 mins                    Coolidge Breeze, PT, DPT [place] Department Office: [phone] Pager: [phone]   Coolidge Breeze 11/28/2021, 3:44 PM

## 2021-11-28 NOTE — Progress Notes (Signed)
PROGRESS NOTE    Kurt Foley  EYC:[id number] DOB: [birth year] DOA: 11/09/2021 PCP: Gildardo Pounds, NP   Brief Narrative:  46 year old with advanced AIDS, disseminated MAC substance abuse, depression, prior CVA with residual right-sided weakness, pulmonary embolism on Xarelto admitted for cough and palpitations.  Repeat CT showed abnormal lung parenchyma.  He was seen by pulmonary, cultures remain negative.  Underwent bronchoscopy with EBUS on 6/5 with subcarinal lymph node sampling and brushing with BAL of right upper lobe mass.  Brushings remain negative, IR consulted for CT-guided biopsy.  Patient off-and-on spiking fever, discussed with ID and this is likely due to MAC.  Patient has also been seen by palliative care service, wishes to be full code. Unforturnately continues to spike fevers daily leading to tachycardia.   ID is now changing antibiotics and trying IV Zosyn.  Continues to have temperatures and tachycardia along with some back pain but fever seems improved this morning and ID recommends continuing current antibiotics and regimen at this time.  Palliative care is increasing his blood but now and PT OT recommending outpatient PT.  Given his recurrent fevers ID is now checking a CT of the chest abdomen pelvis to see if there is anything new that they could consider; otherwise that they are going to continue his current plan however they have changed his HIV medication from [place] to [place].  He has not changed his morphine p.o. to oxycodone.  Given his poor appetite we will start him on D5W half-normal saline at 75 MLS per hour and also given 500 mill bolus given his slight hypotension.  CT Scan of the Chest/Abd/Pelvis done and showed "Worsening nodal disease since recent imaging from May in April in the chest and abdomen though improved when compared to January. Findings may reflect either sequela of age related infection or could potentially be related to  neoplasm/lymphoproliferative disorder. Would suggest correlation with PET imaging to select a site for sampling. This could also be utilized to assess pulmonary findings that have raise concern on recent imaging. More  masslike appearance of RIGHT upper lobe changes and LEFT upper lobe nodules as discussed above, consideration for  pulmonary neoplasm as discussed on recent imaging. Signs of pulmonary emphysema. Three-vessel coronary artery disease.  Low-attenuation cardiac blood pool compatible with anemia.  Stranding about jejunal loops in the upper abdomen is of uncertain significance but is associated with increasing nodal disease, perhaps related to inflammation or lymphatic congestion. Attention on follow-up.  Bilateral perinephric stranding which was not evident on most recent abdominal imaging. Correlation with urinalysis is suggested. Aortic Atherosclerosis (ICD10-I70.0) and Emphysema."  ID recommends continuing current plan of care transitioning to oral Augmentin for 5 days at discharge.  ID feels that they do not expect any meaningful recovery but patient wishes to continue his current treatments and wants to consider physical rehabilitation.  We will consult PT OT for further evaluation recommendations and they are recommending outpatient PT and OT.  [name] infectious diseases reached out to me to see if we can get a GI consult for this patient given his nausea vomiting and difficulty with his food.  GI evaluated and feels that multiple medications can account for his nausea vomiting but he does not think this is a primary GI process and feels that the patient's main concern is singultus.  GI has recommended hospice but is declined.  GI feels there is no need for endoscopy currently but if there is concern for infectious esophagitis empiric treatment such as fluconazole  to be considered we will defer this to infectious diseases.  We will place the patient on scheduled antiemetics and [name] spoke with  [name].  If he remains stable likely can be discharged in next 24 to 48 hours.  Nutrition recommending changing Marinol to 8 AM and 3 PM daily for peak effectiveness   Assessment and Plan: No notes have been filed under this hospital service. Service: Hospitalist  SIRS with recurrent fevers History of disseminated MAC infection - Seen by ID.  This is likely secondary to disseminated MAC history.  Current regimen-Biktarvy, azithromycin and ethambutol. Now on Rifabutin as well but given his interactions with drugs he was changed to Triumeq -ID feels that he has no real source of his fever but could be secondary to his disseminated MRSA infection -ID is now adding IV Zosyn for empiric treatment of occult infection and monitoring fever and fever continues to recur and continues to have high temperatures even though he had a period where he is fever free but likely in the anti-inflammatory effects of antibiotics -ID is now ordering a CT of the chest abdomen abdomen and pelvis to look for anything new that they can consider to treat including new atypical pneumonia, occult abscess or more lymphadenopathy -CT Scan of the Chest/Abd/Pelvis done and showed "Worsening nodal disease since recent imaging from May in April in the chest and abdomen though improved when compared to January. Findings may reflect either sequela of age related infection or could potentially be related to neoplasm/lymphoproliferative disorder. Would suggest correlation with PET imaging to select a site for sampling. This could also be utilized to assess pulmonary findings that have raise concern on recent imaging. More  masslike appearance of RIGHT upper lobe changes and LEFT upper lobe nodules as discussed above, consideration for  pulmonary neoplasm as discussed on recent imaging. Signs of pulmonary emphysema. Three-vessel coronary artery disease.  Low-attenuation cardiac blood pool compatible with anemia.  Stranding about jejunal  loops in the upper abdomen is of uncertain significance but is associated with increasing nodal disease, perhaps related to inflammation or lymphatic congestion. Attention on follow-up.  Bilateral perinephric stranding which was not evident on most recent abdominal imaging. Correlation with urinalysis is suggested. Aortic Atherosclerosis (ICD10-I70.0) and Emphysema." -ID recommended GI evaluation given inability to keep food down and nausea and vomiting but GI evaluated and feels that he has no evidence of this and the patient's main issue is singultus.  [name] does not feel that his nausea vomiting is a primary GI process and recommending symptomatic treatment and scheduling antiemetics as well as possible treatment with fluconazole if concern for infectious esophagitis -GI signed off the case and has recommended hospice but patient has declined -ID recommends continuing current plan of care as his fever has improved and will continue IV Zosyn for now and transition to oral Augmentin for 5 days at discharge but will need PT and OT to further evaluate and treat given that he is extremely weak -PT OT recommending home health PT and OT   Sinus Tachycardia with fevers.  - Continues to spike fevers and off-and-on having tachycardia.  Continue Toprol-XL 100 mg daily.  IV fluid hydration has stopped but is now resumed at D5 half-normal saline at 75 mils per hour and will give another 500 bolus -Also on IV metoprolol as needed   Right upper lobe mass Dyspnea with shortness of breath  -Status post bronchoscopy with EBUS on 6/5.  Brushings remain negative.  Case discussed  with IR who recommends outptn PET scan. Spoke with Dr Lamonte Sakai from [facility], who also reviewed images and recommended outptn PET followed by follow up with Dr Valeta Harms for discussion of possible Navigation Bronch. There may be a component of infection which may clear with time?  -Getting a CT of the chest abdomen pelvis as above -On IV Zosyn now we  will continue for now -He will need follow-up on his mass noted   AKI Metabolic acidosis - AKI has resolved resolved -Patient's BUN/creatinine is now stable at 10/0.86 -Patient's metabolic acidosis is improving slightly and he has a CO2 of 19, anion gap of 12, chloride level of 105 -Avoid nephrotoxic medications, contrast dyes, hypotension and adjust medications   Pulmonary Embolus Has a history of DVT -Transitioned back to Xarelto.   History of CVA -Residual right-sided weakness with right hand and arm contractures.  On statin -We will get PT and OT to further evaluate and treat  Emphysema -Bronchodilators to be continued.  HIV with history of MAI. -Infectious diseases following.  currently on Triumeq and ethambutol and rifabutin. On azithromycin daily as well and now going on IV Zosyn -He is on 3 drugs and they will continue and they feel it is difficult with his underlying poor immune recovery -See above  Substance use -Counseled to quit using.  Depression -Continue with on Seroquel and Remeron   Hyponatremia -Patient's sodium is 133 yesterday and now today is 136 -Monitor and trend and repeat CMP in a.m.   Normocytic Anemia -Patient's hemoglobin/hematocrit is now 7.7/26.9 -> 7.5/24.8 -> 8.1/27.5 -> 8.0/26.7 -> 7.9/26.6 and is stable today at 7.9/25.9 -Trended down from 9.6/31.4 on 6/8 -Check anemia panel in a.m. -Continue monitor for signs of a bleeding; no overt bleeding noted -Repeat CBC in the a.m.  GERD -Daily PPI with Pantoprazole 40 mg po Daily   Transaminitis -Trend LFTs -LFTs have been slightly abnormal but have now been improving and AST is now 52 and ALT 43 -Continue monitor and if necessary will obtain a right upper quadrant ultrasound   Goals of care Severe protein calorie malnutrition -Advanced HIV with very guarded prognosis.  -Palliative care following-he wishes to be full code -Supplements.  Marinol added and dose increased today given that  he has extremely poor p.o. intake and has now been changed to 8 AM and 3 PM dosing given dietary recommendations -Palliative care has adjusted his medication and is now on Oxycodone IR 5 to 10 mg every 4 hours as needed for moderate-severe pain -Marinol has been increased to 5 mg twice daily -See IV fluid hydration as above given his poor p.o. intake  DVT prophylaxis:  rivaroxaban (XARELTO) tablet 20 mg    Code Status: Full Code Family Communication: No family currently at bedside  Disposition Plan:  Level of care: Progressive Status is: Inpatient Remains inpatient appropriate because: Continues to have some nausea and vomiting and pain.  Tachycardia and fevers are improving now.  PT OT recommending home health   Consultants:  Infectious diseases Palliative care medicine Pulmonary Interventional radiology Gastroenterology   Procedures:  Procedure(s): Flexible bronchoscopy (47829) Brushing (56213) of the RUL  Bronchial alveolar lavage (08657) of the RUL Endobronchial ultrasound (84696) Transbronchial needle aspiration (29528) of the station 7    He will be underwent a CT of the chest abdomen pelvis  Antimicrobials:  Anti-infectives (From admission, onward)    Start     Dose/Rate Route Frequency Ordered Stop   11/24/21 1000  abacavir-dolutegravir-lamiVUDine (TRIUMEQ) 600-50-300 MG per  tablet 1 tablet        1 tablet Oral Daily 11/23/21 1107     11/23/21 1130  piperacillin-tazobactam (ZOSYN) IVPB 3.375 g        3.375 g 12.5 mL/hr over 240 Minutes Intravenous Every 8 hours 11/23/21 1042     11/20/21 1500  rifabutin (MYCOBUTIN) capsule 300 mg        300 mg Oral Daily 11/20/21 1341     11/20/21 1430  sulfamethoxazole-trimethoprim (BACTRIM DS) 800-160 MG per tablet 1 tablet        1 tablet Oral Daily 11/20/21 1342     11/17/21 0800  vancomycin (VANCOCIN) IVPB 1000 mg/200 mL premix  Status:  Discontinued        1,000 mg 200 mL/hr over 60 Minutes Intravenous Every 12 hours  11/16/21 1807 11/17/21 1048   11/16/21 1830  vancomycin (VANCOREADY) IVPB 1500 mg/300 mL        1,500 mg 150 mL/hr over 120 Minutes Intravenous STAT 11/16/21 1758 11/16/21 2048   11/16/21 1800  ceFEPIme (MAXIPIME) 2 g in sodium chloride 0.9 % 100 mL IVPB  Status:  Discontinued        2 g 200 mL/hr over 30 Minutes Intravenous Every 8 hours 11/16/21 1756 11/17/21 1048   11/11/21 2200  azithromycin (ZITHROMAX) tablet 500 mg        500 mg Oral Daily 11/11/21 1511     11/10/21 2200  azithromycin (ZITHROMAX) 500 mg in sodium chloride 0.9 % 250 mL IVPB  Status:  Discontinued        500 mg 250 mL/hr over 60 Minutes Intravenous Every 24 hours 11/10/21 1316 11/11/21 1511   11/10/21 1400  cefTRIAXone (ROCEPHIN) 2 g in sodium chloride 0.9 % 100 mL IVPB  Status:  Discontinued        2 g 200 mL/hr over 30 Minutes Intravenous Every 24 hours 11/10/21 1316 11/11/21 1511   11/10/21 1000  ethambutol (MYAMBUTOL) tablet 800 mg        800 mg Oral Daily 11/09/21 1840     11/10/21 1000  sulfamethoxazole-trimethoprim (BACTRIM DS) 800-160 MG per tablet 1 tablet  Status:  Discontinued        1 tablet Oral Daily 11/09/21 1840 11/10/21 1316   11/09/21 2200  azithromycin (ZITHROMAX) tablet 500 mg  Status:  Discontinued        500 mg Oral Daily at bedtime 11/09/21 1909 11/10/21 1316   11/09/21 1830  vancomycin (VANCOREADY) IVPB 1500 mg/300 mL        1,500 mg 150 mL/hr over 120 Minutes Intravenous STAT 11/09/21 1817 11/09/21 2157   11/09/21 1830  piperacillin-tazobactam (ZOSYN) IVPB 3.375 g        3.375 g 100 mL/hr over 30 Minutes Intravenous STAT 11/09/21 1817 11/09/21 1932   11/09/21 1700  bictegravir-emtricitabine-tenofovir AF (BIKTARVY) 50-200-25 MG per tablet 1 tablet  Status:  Discontinued        1 tablet Oral Daily 11/09/21 1608 11/23/21 1107       Subjective: Seen and examined at bedside and thinks the pain is easing off little bit.  Apparently he does complain of some nausea and vomiting but his main  concern is hiccups.  Feels okay.  Appears calmer.  No lightheadedness or dizziness.  No other concerns or complaints at this time but does state that he is feels cold again.  Objective: Vitals:   11/28/21 0830 11/28/21 1048 11/28/21 1450 11/28/21 1624  BP:  104/77 101/68   Pulse:  Marland Kitchen)  105 (!) 104   Resp:   20   Temp:   98.6 F (37 C)   TempSrc:   Oral   SpO2: 99%  99%   Weight:    65.5 kg  Height:        Intake/Output Summary (Last 24 hours) at 11/28/2021 1638 Last data filed at 11/28/2021 1614 Gross per 24 hour  Intake 308.51 ml  Output 895 ml  Net -586.49 ml   Filed Weights   11/09/21 2040 11/18/21 0430 11/28/21 1624  Weight: 67.9 kg 69.2 kg 65.5 kg   Examination: Physical Exam:  Constitutional: Thin African-American male currently no acute distress appears calmer and a little bit more comfortable Respiratory: Diminished to auscultation bilaterally, no wheezing, rales, rhonchi or crackles. Normal respiratory effort and patient is not tachypenic. No accessory muscle use.  Unlabored breathing Cardiovascular: RRR, no murmurs / rubs / gallops. S1 and S2 auscultated.  Has some slight lower extremity edema Abdomen: Soft, non-tender, non-distended. Bowel sounds positive.  GU: Deferred. Musculoskeletal: Has some right arm and right hand contractures in the setting of his CVA Skin: No rashes, lesions, ulcers on limited skin evaluation. No induration; Warm and dry.  Neurologic: CN 2-12 grossly intact with no focal deficits. Romberg sign and cerebellar reflexes not assessed.  Psychiatric: Normal judgment and insight. Alert and oriented x 3.  Appears calmer today  Data Reviewed: I have personally reviewed following labs and imaging studies  CBC: Recent Labs  Lab 11/23/21 1029 11/24/21 0455 11/25/21 0439 11/26/21 0530 11/27/21 0505 11/28/21 0344  WBC 6.1 4.4 6.8 5.8 5.3 6.6  NEUTROABS 4.1  --  4.8 4.4 3.7 4.7  HGB 7.7* 7.5* 8.1* 8.0* 7.9* 7.9*  HCT 26.9* 24.8* 27.5* 26.7*  26.6* 25.9*  MCV 95.4 90.5 92.0 90.8 91.1 89.0  PLT 301 369 422* 466* 445* 884*   Basic Metabolic Panel: Recent Labs  Lab 11/23/21 1029 11/24/21 0455 11/25/21 0439 11/26/21 0530 11/27/21 0505 11/28/21 0344  NA 136 141 139 136 133* 136  K 4.2 3.7 4.4 4.3 4.4 4.3  CL 112* 115* 108 108 106 105  CO2 15* 18* 20* 19* 18* 19*  GLUCOSE 82 122* 88 95 98 94  BUN _0 CREATININE 0.90 1.05 0.96 0.88 0.89 0.86  CALCIUM 8.9 9.2 9.3 9.4 9.4 10.0  MG 1.8 2.0 1.9 2.1 2.0 2.1  PHOS 4.2  --  5.0* 4.4 3.9 4.6   GFR: Estimated Creatinine Clearance: 95.8 mL/min (by C-G formula based on SCr of 0.86 mg/dL). Liver Function Tests: Recent Labs  Lab 11/23/21 1029 11/25/21 0439 11/26/21 0530 11/27/21 0505 11/28/21 0344  AST 56* 45* 40 47* 52*  ALT 46* 41 37 39 43  ALKPHOS 259* 311* 275* 262* 273*  BILITOT 0.8 0.6 0.3 0.5 0.7  PROT 6.6 7.4 7.3 7.0 7.5  ALBUMIN 2.6* 2.6* 2.6* 2.6* 2.6*   No results for input(s): "LIPASE", "AMYLASE" in the last 168 hours. No results for input(s): "AMMONIA" in the last 168 hours. Coagulation Profile: No results for input(s): "INR", "PROTIME" in the last 168 hours. Cardiac Enzymes: No results for input(s): "CKTOTAL", "CKMB", "CKMBINDEX", "TROPONINI" in the last 168 hours. BNP (last 3 results) No results for input(s): "PROBNP" in the last 8760 hours. HbA1C: No results for input(s): "HGBA1C" in the last 72 hours. CBG: Recent Labs  Lab 11/23/21 0716 11/25/21 2105  GLUCAP 115* 106*   Lipid Profile: No results for input(s): "CHOL", "HDL", "LDLCALC", "TRIG", "CHOLHDL", "LDLDIRECT" in the  last 72 hours. Thyroid Function Tests: No results for input(s): "TSH", "T4TOTAL", "FREET4", "T3FREE", "THYROIDAB" in the last 72 hours. Anemia Panel: No results for input(s): "VITAMINB12", "FOLATE", "FERRITIN", "TIBC", "IRON", "RETICCTPCT" in the last 72 hours. Sepsis Labs: No results for input(s): "PROCALCITON", "LATICACIDVEN" in the last 168 hours.  Recent  Results (from the past 240 hour(s))  Acid Fast Smear (AFB)     Status: None   Collection Time: 11/19/21 11:44 AM   Specimen: Vein; Blood  Result Value Ref Range Status   AFB Specimen Processing Concentration  Final    Comment: (NOTE) Performed At: [facility] [place], [place] [id number] Rush Farmer MD [id number]    Acid Fast Smear QNSAFB  Final    Comment: (NOTE) Test not performed. AFB Smear not performed due to specimen source (blood) or insufficient specimen.    Source (AFB) BLOOD  Final    Comment: Performed at [facility], [place] [address], [place], [place] [address]  Culture, blood (Routine X 2) w Reflex to ID Panel     Status: None   Collection Time: 11/21/21  4:18 PM   Specimen: BLOOD  Result Value Ref Range Status   Specimen Description   Final    BLOOD BLOOD LEFT FOREARM Performed at [place] [address] [place], [place] [address]    Special Requests   Final    BOTTLES DRAWN AEROBIC ONLY Blood Culture results may not be optimal due to an inadequate volume of blood received in culture bottles Performed at [facility] [address] [place], [place] [address]    Culture   Final    NO GROWTH 5 DAYS Performed at [facility], [place] [address], [place], [place] [address]    Report Status 11/26/2021 FINAL  Final  Culture, blood (Routine X 2) w Reflex to ID Panel     Status: None   Collection Time: 11/21/21  4:18 PM   Specimen: BLOOD  Result Value Ref Range Status   Specimen Description   Final    BLOOD BLOOD LEFT WRIST Performed at [place] [address], [place], [place] [address]    Special Requests   Final    IN PEDIATRIC BOTTLE Blood Culture adequate volume Performed at [place] [address], [place], [place] [address]    Culture   Final    NO GROWTH 5 DAYS Performed at [facility], [place] [address], [place], [place] [address]    Report Status 11/26/2021 FINAL  Final     Radiology Studies: No results found.   Scheduled Meds:  abacavir-dolutegravir-lamiVUDine  1 tablet Oral Daily   azithromycin  500 mg Oral Daily   B-complex with vitamin C  1 tablet Oral Daily   dronabinol  5 mg Oral BID AC   ethambutol  800 mg Oral Daily   feeding supplement  237 mL Oral TID BM   ketoconazole   Topical BID   metoprolol succinate  100 mg Oral q morning   mirtazapine  15 mg Oral QHS   mometasone-formoterol  2 puff Inhalation BID   pantoprazole  40 mg Oral QAC breakfast   QUEtiapine  200 mg Oral QHS   rifabutin  300 mg Oral Daily   rivaroxaban  20 mg Oral Q supper   rosuvastatin  5 mg Oral q morning   sodium chloride flush  3 mL Intravenous Q12H   sulfamethoxazole-trimethoprim  1 tablet Oral Daily   umeclidinium bromide  1 puff Inhalation Daily   Continuous Infusions:  piperacillin-tazobactam (ZOSYN)  IV 3.375 g (11/28/21 1321)    LOS: 18 days   Raiford Noble, DO [facility] Available via Epic secure chat 7am-7pm After these hours, please refer to coverage provider listed on [email or web address] 11/28/2021, 4:38 PM

## 2021-11-28 NOTE — Progress Notes (Signed)
PT Cancellation Note  Patient Details Name: Kurt Foley MRN: [id number] DOB: [birth year]   Cancelled Treatment:    Reason Eval/Treat Not Completed: (P) Pain limiting ability to participate, pt reporting "10/10 pain all over" and requests PT return at later time. Will follow up as schedule and pt status allows.  Jamesetta Geralds, PT, DPT [facility] Department Office: [phone] Pager: [phone]   Jamesetta Geralds 11/28/2021, 11:15 AM

## 2021-11-28 NOTE — Consult Note (Signed)
[facility] Consultation Note  Referring Provider: [facility] Primary Care Physician:  Gildardo Pounds, NP  Reason for Consultation:  nausea/vomiting  HPI: Kurt Foley is a 46 y.o. male with multiple issues (poorly controlled HIV, not compliant), disseminated MAC, lung lesion.  We were asked to see for nausea/vomiting.  Upon direct and repeated discussion with patient, he denies dysphagia/odynophagia.  He reports having hiccups and thinks that's the issue.  Reports no appetite for food, but I can't get a history of vomiting.  No blood in stool.  Slow progressive weight loss.   Past Medical History:  Diagnosis Date   Depression    HIV infection ([place])     Past Surgical History:  Procedure Laterality Date   BRONCHIAL BRUSHINGS  11/14/2021   Procedure: BRONCHIAL BRUSHINGS - RUL;  Surgeon: Rigoberto Noel, MD;  Location: [facility];  Service: Cardiopulmonary;;   BRONCHIAL NEEDLE ASPIRATION BIOPSY  11/14/2021   Procedure: BRONCHIAL NEEDLE ASPIRATION BIOPSIES - STATION 7;  Surgeon: Rigoberto Noel, MD;  Location: [facility];  Service: Cardiopulmonary;;   BRONCHIAL WASHINGS  11/14/2021   Procedure: BRONCHIAL WASHINGS - RUL;  Surgeon: Rigoberto Noel, MD;  Location: [facility];  Service: Cardiopulmonary;;   ENDOBRONCHIAL ULTRASOUND Bilateral 11/14/2021   Procedure: ENDOBRONCHIAL ULTRASOUND;  Surgeon: Rigoberto Noel, MD;  Location: [facility];  Service: Cardiopulmonary;  Laterality: Bilateral;   VIDEO BRONCHOSCOPY Right 11/14/2021   Procedure: VIDEO BRONCHOSCOPY WITH FLUORO;  Surgeon: Rigoberto Noel, MD;  Location: [facility];  Service: Cardiopulmonary;  Laterality: Right;    Prior to Admission medications   Medication Sig Start Date End Date Taking? Authorizing Provider  acetaminophen (TYLENOL) 325 MG tablet Take 1-2 tablets (325-650 mg total) by mouth every 4 (four) hours as needed for mild pain. 08/05/21  Yes Love, Ivan Anchors, PA-C  albuterol (VENTOLIN HFA) 108 (90 Base)  MCG/ACT inhaler Inhale 2 puffs into the lungs every 6 hours as needed for wheezing or shortness of breath. 11/02/21  Yes Amin, Ankit Chirag, MD  azithromycin (ZITHROMAX) 500 MG tablet Take 1 tablet by mouth at bedtime. 10/07/21  Yes Mercy Riding, MD  bictegravir-emtricitabine-tenofovir AF (BIKTARVY) 50-200-25 MG TABS tablet Take 1 tablet by mouth daily. Patient taking differently: Take 1 tablet by mouth every morning. 08/05/21  Yes Love, Ivan Anchors, PA-C  budesonide-formoterol (SYMBICORT) 160-4.5 MCG/ACT inhaler Inhale 2 puffs into the lungs in the morning and at bedtime. 10/07/21  Yes Mercy Riding, MD  loratadine (CLARITIN) 10 MG tablet Take 1 tablet (10 mg total) by mouth daily. Patient taking differently: Take 10 mg by mouth every morning. 09/14/21  Yes Comer, Okey Regal, MD  megestrol (MEGACE) 400 MG/10ML suspension Take 10 mLs (400 mg total) by mouth daily. Patient taking differently: Take 400 mg by mouth every morning. 08/06/21  Yes Love, Ivan Anchors, PA-C  metoprolol succinate (TOPROL-XL) 50 MG 24 hr tablet Take 1 tablet by mouth every morning. Take with or immediately following a meal. 10/07/21  Yes Gonfa, Taye T, MD  mirtazapine (REMERON) 15 MG tablet Take 1 tablet (15 mg total) by mouth at bedtime. 08/05/21  Yes Love, Ivan Anchors, PA-C  Multiple Vitamins-Minerals (CERTAVITE/ANTIOXIDANTS) TABS Take 1 tablet by mouth daily. 08/05/21  Yes Love, Ivan Anchors, PA-C  nystatin cream (MYCOSTATIN) Apply 1 application. topically 2 (two) times daily. Patient taking differently: Apply 1 application. topically 2 (two) times daily as needed for dry skin. 10/07/21  Yes Mercy Riding, MD  pantoprazole (PROTONIX) 40 MG tablet Take  1 tablet by mouth daily before breakfast. 11/02/21 12/02/21 Yes Amin, Jeanella Flattery, MD  polyethylene glycol (MIRALAX / GLYCOLAX) 17 g packet Take 17 g by mouth daily as needed for mild constipation or moderate constipation. 07/20/21  Yes Pokhrel, Laxman, MD  QUEtiapine (SEROQUEL) 200 MG tablet Take 1 tablet  (200 mg total) by mouth at bedtime. 08/05/21  Yes Love, Ivan Anchors, PA-C  RIVAROXABAN Alveda Reasons) VTE STARTER PACK (15 & 20 MG) Follow package directions: Take one 43m tablet by mouth twice a day. On day 22, switch to one 251mtablet once a day. Take with food. 11/02/21  Yes Amin, Ankit Chirag, MD  rosuvastatin (CRESTOR) 5 MG tablet Take 1 tablet (5 mg total) by mouth daily. Patient taking differently: Take 5 mg by mouth every morning. 08/17/21  Yes FlGildardo PoundsNP  sulfamethoxazole-trimethoprim (BACTRIM DS) 800-160 MG tablet Take 1 tablet by mouth daily. 10/07/21  Yes GoMercy RidingMD  umeclidinium bromide (INCRUSE ELLIPTA) 62.5 MCG/ACT AEPB Inhale 1 puff into the lungs daily. 10/07/21  Yes GoMercy RidingMD  aspirin 81 MG chewable tablet Chew 1 tablet (81 mg total) by mouth daily. Patient not taking: Reported on 11/09/2021 08/05/21   Love, PaIvan AnchorsPA-C  ethambutol (MYAMBUTOL) 400 MG tablet Take 2 tablets by mouth daily. 10/07/21   GoMercy RidingMD  senna-docusate (SENOKOT-S) 8.6-50 MG tablet Take 1 tablet by mouth 2 (two) times daily. Patient not taking: Reported on 10/31/2021 08/05/21   LoBary LerichePA-C    Current Facility-Administered Medications  Medication Dose Route Frequency Provider Last Rate Last Admin   abacavir-dolutegravir-lamiVUDine (TRIUMEQ) 60707-86-754G per tablet 1 tablet  1 tablet Oral Daily CoThayer HeadingsMD   1 tablet at 11/28/21 1048   acetaminophen (TYLENOL) 160 MG/5ML solution 650 mg  650 mg Oral Q6H PRN Amin, Ankit Chirag, MD   650 mg at 11/23/21 1210   acetaminophen (TYLENOL) tablet 650 mg  650 mg Oral Q6H PRN MeMarcelyn BruinsMD   650 mg at 11/24/21 2146   Or   acetaminophen (TYLENOL) suppository 650 mg  650 mg Rectal Q6H PRN MeMarcelyn BruinsMD       azithromycin ([facility]Quill[facility] Rehabilitation Hospitaltablet 500 mg  500 mg Oral Daily Vu, Trung T, MD   500 mg at 11/27/21 2126   B-complex with vitamin C tablet 1 tablet  1 tablet Oral Daily ChDonne HazelMD   1 tablet at 11/28/21 1108    baclofen (LIORESAL) tablet 5 mg  5 mg Oral TID PRN ShRaiford Nobleatif, DO   5 mg at 11/28/21 1439   chlorpheniramine-HYDROcodone 10-8 MG/5ML suspension 5 mL  5 mL Oral Q12H PRN ChDonne HazelMD   5 mL at 11/26/21 1806   dronabinol (MARINOL) capsule 5 mg  5 mg Oral BID AC Sheikh, Omair LaBrooksDO   5 mg at 11/28/21 1317   ethambutol (MYAMBUTOL) tablet 800 mg  800 mg Oral Daily MeMarcelyn BruinsMD   800 mg at 11/28/21 1048   feeding supplement (ENSURE ENLIVE / ENSURE PLUS) liquid 237 mL  237 mL Oral TID BM Amin, Ankit Chirag, MD   237 mL at 11/28/21 1322   guaiFENesin-dextromethorphan (ROBITUSSIN DM) 100-10 MG/5ML syrup 5 mL  5 mL Oral Q4H PRN MeMarcelyn BruinsMD   5 mL at 11/27/21 2126   hydrALAZINE (APRESOLINE) injection 10 mg  10 mg Intravenous Q4H PRN Amin, AnJeanella FlatteryMD       ibuprofen (  ADVIL) tablet 400 mg  400 mg Oral Q6H PRN Amin, Ankit Chirag, MD   400 mg at 11/24/21 0951   ipratropium-albuterol (DUONEB) 0.5-2.5 (3) MG/3ML nebulizer solution 3 mL  3 mL Nebulization Q4H PRN Amin, Ankit Chirag, MD   3 mL at 11/19/21 1549   ketoconazole (NIZORAL) 2 % cream   Topical BID Vu, Trung T, MD   Given at 11/27/21 0908   metoprolol succinate (TOPROL-XL) 24 hr tablet 100 mg  100 mg Oral q morning Amin, Ankit Chirag, MD   100 mg at 11/28/21 1048   metoprolol tartrate (LOPRESSOR) injection 5 mg  5 mg Intravenous Q4H PRN Amin, Ankit Chirag, MD   5 mg at 11/23/21 1311   mirtazapine (REMERON) tablet 15 mg  15 mg Oral QHS Marcelyn Bruins, MD   15 mg at 11/27/21 2125   mometasone-formoterol (DULERA) 200-5 MCG/ACT inhaler 2 puff  2 puff Inhalation BID Marcelyn Bruins, MD   2 puff at 11/28/21 0829   morphine (PF) 2 MG/ML injection 2 mg  2 mg Intravenous Q4H PRN Micheline Rough, MD   2 mg at 11/28/21 0116   oxyCODONE (Oxy IR/ROXICODONE) immediate release tablet 5-10 mg  5-10 mg Oral Q4H PRN Micheline Rough, MD   10 mg at 11/28/21 1049   pantoprazole (PROTONIX) EC tablet 40 mg  40 mg Oral QAC  breakfast Marcelyn Bruins, MD   40 mg at 11/28/21 1048   piperacillin-tazobactam (ZOSYN) IVPB 3.375 g  3.375 g Intravenous Q8H Thayer Headings, MD 12.5 mL/hr at 11/28/21 1321 3.375 g at 11/28/21 1321   polyethylene glycol (MIRALAX / GLYCOLAX) packet 17 g  17 g Oral Daily PRN Marcelyn Bruins, MD       QUEtiapine (SEROQUEL) tablet 200 mg  200 mg Oral QHS Marcelyn Bruins, MD   200 mg at 11/27/21 2125   rifabutin (MYCOBUTIN) capsule 300 mg  300 mg Oral Daily Carlyle Basques, MD   300 mg at 11/28/21 1048   rivaroxaban (XARELTO) tablet 20 mg  20 mg Oral Q supper Adrian Saran, RPH   20 mg at 11/27/21 1800   rosuvastatin (CRESTOR) tablet 5 mg  5 mg Oral q morning Marcelyn Bruins, MD   5 mg at 11/28/21 1048   senna-docusate (Senokot-S) tablet 1 tablet  1 tablet Oral QHS PRN Amin, Ankit Chirag, MD       sodium chloride flush (NS) 0.9 % injection 3 mL  3 mL Intravenous Q12H Marcelyn Bruins, MD   3 mL at 11/28/21 1055   sulfamethoxazole-trimethoprim (BACTRIM DS) 800-160 MG per tablet 1 tablet  1 tablet Oral Daily Carlyle Basques, MD   1 tablet at 11/28/21 1048   umeclidinium bromide (INCRUSE ELLIPTA) 62.5 MCG/ACT 1 puff  1 puff Inhalation Daily Marcelyn Bruins, MD   1 puff at 11/21/21 0738    Allergies as of 11/09/2021 - Review Complete 11/09/2021  Allergen Reaction Noted   Lidocaine Other (See Comments) 08/08/2021    Family History  Problem Relation Age of Onset   Rheum arthritis Mother    Diabetes Mother    Hypertension Mother    Arthritis Mother     Social History   Socioeconomic History   Marital status: Single    Spouse name: Not on file   Number of children: Not on file   Years of education: Not on file   Highest education level: Not on file  Occupational History   Not on file  Tobacco  Use   Smoking status: Former    Packs/day: 0.50    Years: 25.00    Total pack years: 12.50    Types: Cigarettes   Smokeless tobacco: Never   Tobacco comments:    cutting  back  Vaping Use   Vaping Use: Never used  Substance and Sexual Activity   Alcohol use: Not Currently    Alcohol/week: 2.0 standard drinks of alcohol    Types: 2 Standard drinks or equivalent per week    Comment: 3-4 days a week.Last drink: today   Drug use: Not Currently    Types: Marijuana, Cocaine    Comment: Once a week. Last used yesterday.  Cocaine last used today.    Sexual activity: Not Currently    Partners: Male    Comment: encouraged condom use  Other Topics Concern   Not on file  Social History Narrative   Not on file   Social Determinants of Health   Financial Resource Strain: Not on file  Food Insecurity: Not on file  Transportation Needs: Not on file  Physical Activity: Not on file  Stress: Not on file  Social Connections: Not on file  Intimate Partner Violence: Not on file    Review of Systems: As per HPI, all others negative  Physical Exam: Vital signs in last 24 hours: Temp:  [98.6 F (37 C)-99.7 F (37.6 C)] 98.6 F (37 C) (06/19 1450) Pulse Rate:  [100-105] 104 (06/19 1450) Resp:  [20-22] 20 (06/19 1450) BP: (101-108)/(68-89) 101/68 (06/19 1450) SpO2:  [91 %-99 %] 99 % (06/19 1450) Last BM Date : 11/27/21 General:   Thin, cachectic, chronically ill-appearing Head:  Normocephalic and atraumatic. Eyes:  Sclera clear, no icterus.   Conjunctiva pink. Ears:  Normal auditory acuity. Nose:  No deformity, discharge,  or lesions. Mouth:  No deformity or lesions.  Oropharynx dry and cracked. Neck:  Supple; no masses or thyromegaly. Abdomen:  Soft, nontender and nondistended. No masses, hepatosplenomegaly or hernias noted. Normal bowel sounds, without guarding, and without rebound.     Msk:  Symmetrical without gross deformities. Normal posture. Pulses:  Normal pulses noted. Extremities:  Without clubbing or edema. Neurologic:  Alert and  oriented x4;  grossly normal neurologically. Skin:  Intact without significant lesions or rashes. Psych:   Somnolent, arousable. Depressed mood, flat affect   Lab Results: Recent Labs    11/26/21 0530 11/27/21 0505 11/28/21 0344  WBC 5.8 5.3 6.6  HGB 8.0* 7.9* 7.9*  HCT 26.7* 26.6* 25.9*  PLT 466* 445* 474*   BMET Recent Labs    11/26/21 0530 11/27/21 0505 11/28/21 0344  NA 136 133* 136  K 4.3 4.4 4.3  CL 108 106 105  CO2 19* 18* 19*  GLUCOSE 95 98 94  BUN _0 CREATININE 0.88 0.89 0.86  CALCIUM 9.4 9.4 10.0   LFT Recent Labs    11/28/21 0344  PROT 7.5  ALBUMIN 2.6*  AST 52*  ALT 43  ALKPHOS 273*  BILITOT 0.7   PT/INR No results for input(s): "LABPROT", "INR" in the last 72 hours.  Studies/Results: No results found.  Impression:   Nausea/vomiting.  Patient denies this is a major issue for him.  Multiple medications (especially MAC meds and narcotics) could account for this.  I do not think this is a primary GI process. Hiccups, patient's primary concern. Disseminated MAC, poorly controlled HIV, lung lesion.  Plan:  By all measures, patient appears best suited for hospice care but has thus  far declined. Patient is in no shape, now or ever, for endoscopy in absence of life-threatening bleeding (and even then not sure he would be candidate). He has no odynophagia symptoms but if there is concern for infectious esophagitis, empiric treatment (fluconazole) could be considered. Scheduled antiemetics x 48 hours. Patient is taking 3-4 doses of narcotics a day and is on regular diet.  I discussed decreasing diet to clears due to his nausea and cutting back on narcotics, neither option of which he desires to do. I have reviewed case with [name], ID, who saw patient over weekend and follows patient as outpatient in ID clinic. [facility] will sign-off; please call with questions; thank you for the consultation.   LOS: 18 days   Sabian Kuba M  11/28/2021, 3:57 PM  Cell [phone] If no answer or after 5 PM call [phone]

## 2021-11-28 NOTE — Progress Notes (Signed)
Subjective:  "I am hungry"   Antibiotics:  Anti-infectives (From admission, onward)    Start     Dose/Rate Route Frequency Ordered Stop   11/24/21 1000  abacavir-dolutegravir-lamiVUDine (TRIUMEQ) 600-50-300 MG per tablet 1 tablet        1 tablet Oral Daily 11/23/21 1107     11/23/21 1130  piperacillin-tazobactam (ZOSYN) IVPB 3.375 g        3.375 g 12.5 mL/hr over 240 Minutes Intravenous Every 8 hours 11/23/21 1042     11/20/21 1500  rifabutin (MYCOBUTIN) capsule 300 mg        300 mg Oral Daily 11/20/21 1341     11/20/21 1430  sulfamethoxazole-trimethoprim (BACTRIM DS) 800-160 MG per tablet 1 tablet        1 tablet Oral Daily 11/20/21 1342     11/17/21 0800  vancomycin (VANCOCIN) IVPB 1000 mg/200 mL premix  Status:  Discontinued        1,000 mg 200 mL/hr over 60 Minutes Intravenous Every 12 hours 11/16/21 1807 11/17/21 1048   11/16/21 1830  vancomycin (VANCOREADY) IVPB 1500 mg/300 mL        1,500 mg 150 mL/hr over 120 Minutes Intravenous STAT 11/16/21 1758 11/16/21 2048   11/16/21 1800  ceFEPIme (MAXIPIME) 2 g in sodium chloride 0.9 % 100 mL IVPB  Status:  Discontinued        2 g 200 mL/hr over 30 Minutes Intravenous Every 8 hours 11/16/21 1756 11/17/21 1048   11/11/21 2200  azithromycin (ZITHROMAX) tablet 500 mg        500 mg Oral Daily 11/11/21 1511     11/10/21 2200  azithromycin (ZITHROMAX) 500 mg in sodium chloride 0.9 % 250 mL IVPB  Status:  Discontinued        500 mg 250 mL/hr over 60 Minutes Intravenous Every 24 hours 11/10/21 1316 11/11/21 1511   11/10/21 1400  cefTRIAXone (ROCEPHIN) 2 g in sodium chloride 0.9 % 100 mL IVPB  Status:  Discontinued        2 g 200 mL/hr over 30 Minutes Intravenous Every 24 hours 11/10/21 1316 11/11/21 1511   11/10/21 1000  ethambutol (MYAMBUTOL) tablet 800 mg        800 mg Oral Daily 11/09/21 1840     11/10/21 1000  sulfamethoxazole-trimethoprim (BACTRIM DS) 800-160 MG per tablet 1 tablet  Status:  Discontinued        1 tablet  Oral Daily 11/09/21 1840 11/10/21 1316   11/09/21 2200  azithromycin (ZITHROMAX) tablet 500 mg  Status:  Discontinued        500 mg Oral Daily at bedtime 11/09/21 1909 11/10/21 1316   11/09/21 1830  vancomycin (VANCOREADY) IVPB 1500 mg/300 mL        1,500 mg 150 mL/hr over 120 Minutes Intravenous STAT 11/09/21 1817 11/09/21 2157   11/09/21 1830  piperacillin-tazobactam (ZOSYN) IVPB 3.375 g        3.375 g 100 mL/hr over 30 Minutes Intravenous STAT 11/09/21 1817 11/09/21 1932   11/09/21 1700  bictegravir-emtricitabine-tenofovir AF (BIKTARVY) 50-200-25 MG per tablet 1 tablet  Status:  Discontinued        1 tablet Oral Daily 11/09/21 1608 11/23/21 1107       Medications: Scheduled Meds:  abacavir-dolutegravir-lamiVUDine  1 tablet Oral Daily   azithromycin  500 mg Oral Daily   B-complex with vitamin C  1 tablet Oral Daily   dronabinol  5 mg Oral BID AC   ethambutol  800 mg Oral Daily   feeding supplement  237 mL Oral TID BM   ketoconazole   Topical BID   metoprolol succinate  100 mg Oral q morning   mirtazapine  15 mg Oral QHS   mometasone-formoterol  2 puff Inhalation BID   pantoprazole  40 mg Oral QAC breakfast   QUEtiapine  200 mg Oral QHS   rifabutin  300 mg Oral Daily   rivaroxaban  20 mg Oral Q supper   rosuvastatin  5 mg Oral q morning   sodium chloride flush  3 mL Intravenous Q12H   sulfamethoxazole-trimethoprim  1 tablet Oral Daily   umeclidinium bromide  1 puff Inhalation Daily   Continuous Infusions:  piperacillin-tazobactam (ZOSYN)  IV 3.375 g (11/28/21 1321)   PRN Meds:.acetaminophen (TYLENOL) oral liquid 160 mg/5 mL, acetaminophen **OR** acetaminophen, baclofen, chlorpheniramine-HYDROcodone, guaiFENesin-dextromethorphan, hydrALAZINE, ibuprofen, ipratropium-albuterol, metoprolol tartrate, morphine injection, oxyCODONE, polyethylene glycol, senna-docusate    Objective: Weight change:   Intake/Output Summary (Last 24 hours) at 11/28/2021 1930 Last data filed at  11/28/2021 1614 Gross per 24 hour  Intake 285.85 ml  Output 695 ml  Net -409.15 ml   Blood pressure 101/68, pulse (!) 104, temperature 98.6 F (37 C), temperature source Oral, resp. rate 20, height 5\' 6"  (1.676 m), weight 65.5 kg, SpO2 99 %. Temp:  [98.6 F (37 C)-99.7 F (37.6 C)] 98.6 F (37 C) (06/19 1450) Pulse Rate:  [100-105] 104 (06/19 1450) Resp:  [20-22] 20 (06/19 1450) BP: (101-108)/(68-89) 101/68 (06/19 1450) SpO2:  [91 %-99 %] 99 % (06/19 1450) Weight:  [65.5 kg] 65.5 kg (06/19 1624)  Physical Exam: Physical Exam Constitutional:      Appearance: He is cachectic.  HENT:     Head: Normocephalic.  Cardiovascular:     Rate and Rhythm: Tachycardia present.  Pulmonary:     Effort: Pulmonary effort is normal. No respiratory distress.     Breath sounds: No wheezing.  Abdominal:     General: There is distension.  Skin:    General: Skin is warm and dry.  Neurological:     General: No focal deficit present.     Mental Status: He is alert.  Psychiatric:        Attention and Perception: Attention normal.        Mood and Affect: Mood is depressed.        Speech: Speech is delayed.        Behavior: Behavior normal. Behavior is cooperative.      CBC:    BMET Recent Labs    11/27/21 0505 11/28/21 0344  NA 133* 136  K 4.4 4.3  CL 106 105  CO2 18* 19*  GLUCOSE 98 94  BUN 8 10  CREATININE 0.89 0.86  CALCIUM 9.4 10.0     Liver Panel  Recent Labs    11/27/21 0505 11/28/21 0344  PROT 7.0 7.5  ALBUMIN 2.6* 2.6*  AST 47* 52*  ALT 39 43  ALKPHOS 262* 273*  BILITOT 0.5 0.7       Sedimentation Rate No results for input(s): "ESRSEDRATE" in the last 72 hours. C-Reactive Protein No results for input(s): "CRP" in the last 72 hours.  Micro Results: Recent Results (from the past 720 hour(s))  MRSA Next Gen by PCR, Nasal     Status: None   Collection Time: 10/29/21 11:14 PM   Specimen: Nasal Mucosa; Nasal Swab  Result Value Ref Range Status   MRSA  by PCR Next Gen NOT DETECTED NOT DETECTED  Final    Comment: (NOTE) The GeneXpert MRSA Assay (FDA approved for NASAL specimens only), is one component of a comprehensive MRSA colonization surveillance program. It is not intended to diagnose MRSA infection nor to guide or monitor treatment for MRSA infections. Test performance is not FDA approved in patients less than 56 years old. Performed at [facility], [address], [place], [place] [address]   Expectorated Sputum Assessment w Gram Stain, Rflx to Resp Cult     Status: None   Collection Time: 10/30/21 12:34 PM   Specimen: Expectorated Sputum  Result Value Ref Range Status   Specimen Description EXPECTORATED SPUTUM  Final   Special Requests NONE  Final   Sputum evaluation   Final    Sputum specimen not acceptable for testing.  Please recollect.   Performed at [facility], [address], [place], [place] [address]    Report Status 10/30/2021 FINAL  Final  Urine Culture     Status: Abnormal   Collection Time: 11/09/21  3:01 PM   Specimen: Urine, Clean Catch  Result Value Ref Range Status   Specimen Description   Final    URINE, CLEAN CATCH Performed at [facility], [address], [place], [place] [address]    Special Requests   Final    NONE Performed at [facility], [address], [place], [place] [address]    Culture (A)  Final    <10,000 COLONIES/mL INSIGNIFICANT GROWTH Performed at [facility], [address] [place], [place] [address]    Report Status 11/10/2021 FINAL  Final  SARS Coronavirus 2 by RT PCR (hospital order, performed in [facility] hospital lab) *cepheid single result test* Anterior Nasal Swab     Status: None   Collection Time: 11/09/21  3:01 PM   Specimen: Anterior Nasal Swab  Result Value Ref Range Status   SARS Coronavirus 2 by RT PCR NEGATIVE NEGATIVE Final    Comment: (NOTE) SARS-CoV-2 target nucleic acids  are NOT DETECTED.  The SARS-CoV-2 RNA is generally detectable in upper and lower respiratory specimens during the acute phase of infection. The lowest concentration of SARS-CoV-2 viral copies this assay can detect is 250 copies / mL. A negative result does not preclude SARS-CoV-2 infection and should not be used as the sole basis for treatment or other patient management decisions.  A negative result may occur with improper specimen collection / handling, submission of specimen other than nasopharyngeal swab, presence of viral mutation(s) within the areas targeted by this assay, and inadequate number of viral copies (<250 copies / mL). A negative result must be combined with clinical observations, patient history, and epidemiological information.  Fact Sheet for Patients:   RoadLapTop.co.za  Fact Sheet for Healthcare Providers: http://kim-miller.com/  This test is not yet approved or  cleared by the [place] FDA and has been authorized for detection and/or diagnosis of SARS-CoV-2 by FDA under an Emergency Use Authorization (EUA).  This EUA will remain in effect (meaning this test can be used) for the duration of the COVID-19 declaration under Section 564(b)(1) of the Act, 21 U.S.C. section 360bbb-3(b)(1), unless the authorization is terminated or revoked sooner.  Performed at [facility], [address] [place], [place] [address]   Blood Culture (routine x 2)     Status: None   Collection Time: 11/09/21  3:38 PM   Specimen: BLOOD  Result Value Ref Range Status   Specimen Description  Final    BLOOD RIGHT ANTECUBITAL Performed at [facility], [address] [place], [place] [address]    Special Requests   Final    BOTTLES DRAWN AEROBIC AND ANAEROBIC Blood Culture results may not be optimal due to an inadequate volume of blood received in culture bottles Performed at [facility], [address], [place], [place] [address]    Culture   Final    NO GROWTH 5 DAYS Performed at [name], [address] [place], [place] [address]    Report Status 11/14/2021 FINAL  Final  Blood Culture (routine x 2)     Status: None   Collection Time: 11/09/21  5:02 PM   Specimen: BLOOD  Result Value Ref Range Status   Specimen Description   Final    BLOOD SITE NOT SPECIFIED Performed at [facility], [address], [place], [place] [address]    Special Requests   Final    BOTTLES DRAWN AEROBIC AND ANAEROBIC Blood Culture results may not be optimal due to an inadequate volume of blood received in culture bottles Performed at [facility], [address], [place], [place] [address]    Culture   Final    NO GROWTH 5 DAYS Performed at [facility], [address] [place], [place] [address]    Report Status 11/14/2021 FINAL  Final  Acid Fast Smear (AFB)     Status: None   Collection Time: 11/12/21  1:12 AM   Specimen: Vein; Blood  Result Value Ref Range Status   AFB Specimen Processing Concentration  Final    Comment: (NOTE) Performed At: [facility] [address] [place], [place] [id number] Jolene Schimke MD [id number]    Acid Fast Smear QNSAFB  Final    Comment: (NOTE) Test not performed. AFB Smear not performed due to specimen source (blood) or insufficient specimen.    Source (AFB) BLD  Final    Comment: Performed at [facility], [address] [place], [place] [address]  Fungus Culture With Stain     Status: None (Preliminary result)   Collection Time: 11/14/21  2:42 PM   Specimen: Bronchial Washing, Right; Respiratory  Result Value Ref Range Status   Fungus Stain Final report  Final    Comment: (NOTE) Performed At: [facility] [address] [place], [place] [id number] Jolene Schimke MD [id number]    Fungus (Mycology) Culture PENDING   Incomplete   Fungal Source BRONCHIAL ALVEOLAR LAVAGE  Final    Comment: Performed at [facility], [address] [place], [place] [address]  Culture, Respiratory w Gram Stain     Status: None   Collection Time: 11/14/21  2:42 PM   Specimen: Bronchial Washing, Right; Respiratory  Result Value Ref Range Status   Specimen Description   Final    BRONCHIAL ALVEOLAR LAVAGE RUL Performed at [facility], [address], [place], [place] [address]    Special Requests   Final    NONE Performed at [facility], [address] [place], [place] [address]    Gram Stain NO WBC SEEN NO ORGANISMS SEEN   Final   Culture   Final    NO GROWTH 2 DAYS Performed at [facility], [address], [place], [place] [address]    Report Status 11/17/2021 FINAL  Final  Acid Fast Smear (AFB)     Status: None  Collection Time: 11/14/21  2:42 PM   Specimen: Bronchial Washing, Right; Respiratory  Result Value Ref Range Status   AFB Specimen Processing Concentration  Final   Acid Fast Smear Negative  Final    Comment: (NOTE) Performed At: [facility] [address] [place], [place] [id number] Jolene Schimke MD TD:[id number]    Source (AFB) BRONCHIAL ALVEOLAR LAVAGE  Final    Comment: Performed at [facility], [address], [place], [place] [address]  Anaerobic culture w Gram Stain     Status: None   Collection Time: 11/14/21  2:42 PM   Specimen: Bronchial Washing, Right; Respiratory  Result Value Ref Range Status   Specimen Description   Final    BRONCHIAL ALVEOLAR LAVAGE RUL Performed at [facility], [address], [place], [place] [address]    Special Requests   Final    NONE Performed at [facility], [address] [place], [place] [address]    Culture   Final    NO ANAEROBES ISOLATED Performed at [facility], [address], [place], [place]  [address]    Report Status 11/20/2021 FINAL  Final  Fungus Culture Result     Status: None   Collection Time: 11/14/21  2:42 PM  Result Value Ref Range Status   Result 1 Comment  Final    Comment: (NOTE) KOH/Calcofluor preparation:  no fungus observed. Performed At: [facility] [address] [place], [place] [id number] Jolene Schimke MD RC:[id number]   Culture, blood (Routine X 2) w Reflex to ID Panel     Status: None   Collection Time: 11/16/21  7:00 PM   Specimen: BLOOD LEFT FOREARM  Result Value Ref Range Status   Specimen Description BLOOD LEFT FOREARM  Final   Special Requests IN PEDIATRIC BOTTLE Blood Culture adequate volume  Final   Culture   Final    NO GROWTH 5 DAYS Performed at [facility], [address], [place], [place] [address]    Report Status 11/21/2021 FINAL  Final  Culture, blood (Routine X 2) w Reflex to ID Panel     Status: None   Collection Time: 11/16/21  7:00 PM   Specimen: BLOOD LEFT WRIST  Result Value Ref Range Status   Specimen Description   Final    BLOOD LEFT WRIST BLOOD Performed at [facility], [address] [place], [place] [address]    Special Requests IN PEDIATRIC BOTTLE Blood Culture adequate volume  Final   Culture   Final    NO GROWTH 5 DAYS Performed at [facility], [address], [place], [place] [address]    Report Status 11/21/2021 FINAL  Final  Acid Fast Smear (AFB)     Status: None   Collection Time: 11/19/21 11:44 AM   Specimen: Vein; Blood  Result Value Ref Range Status   AFB Specimen Processing Concentration  Final    Comment: (NOTE) Performed At: [facility] [address] [place], [place] [id number] Jolene Schimke MD [id number]    Acid Fast Smear QNSAFB  Final    Comment: (NOTE) Test not performed. AFB Smear not performed due to specimen source (blood) or insufficient specimen.    Source (AFB) BLOOD  Final    Comment: Performed at [facility], [address] [place], [place] [address]  Culture, blood (Routine X 2) w Reflex to ID Panel     Status: None   Collection  Time: 11/21/21  4:18 PM   Specimen: BLOOD  Result Value Ref Range Status   Specimen Description   Final    BLOOD BLOOD LEFT FOREARM Performed at [facility], [address] [place], [place] [address]    Special Requests   Final    BOTTLES DRAWN AEROBIC ONLY Blood Culture results may not be optimal due to an inadequate volume of blood received in culture bottles Performed at [facility], [address] [place], [place] [address]    Culture   Final    NO GROWTH 5 DAYS Performed at [facility], [address] [place], [place] [address]    Report Status 11/26/2021 FINAL  Final  Culture, blood (Routine X 2) w Reflex to ID Panel     Status: None   Collection Time: 11/21/21  4:18 PM   Specimen: BLOOD  Result Value Ref Range Status   Specimen Description   Final    BLOOD BLOOD LEFT WRIST Performed at [facility], [address] [place], [place] [address]    Special Requests   Final    IN PEDIATRIC BOTTLE Blood Culture adequate volume Performed at [facility], [address] [place], [place] [address]    Culture   Final    NO GROWTH 5 DAYS Performed at [facility], [address] [place], [place] [address]    Report Status 11/26/2021 FINAL  Final    Studies/Results: No results found.    Assessment/Plan:  INTERVAL HISTORY:  When I talked to the patient and asked him if he had anything that was bothering him or any other complaints he said "I am hungry" when I tried to understand why he was not able to eat he claims that food was coming back up when he attempted to eat it.  Apparently he denied this later when interviewed by gastroenterology   Principal Problem:   SIRS (systemic inflammatory response syndrome) (HCC) Active Problems:   Human  immunodeficiency virus (HIV) disease (HCC)   Severe recurrent major depression without psychotic features (HCC)   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HCC)   Emphysema, unspecified (HCC)   History of CVA with residual right hemiparesis   Insomnia   Abnormal LFTs   Mass of upper lobe of right lung   Pulmonary embolism (HCC)   Pneumonia   Mediastinal lymphadenopathy   Goals of care, counseling/discussion   General weakness   Encounter for palliative care    Kurt Foley is a 46 y.o. 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  It improved with empiric Zosyn despite a clear obvious target for the antibiotic.  I am going to stop Zosyn at this point in time and watch him off antibiotics  #2 reported dysphagia to me when I interviewed the patient.  He had no evidence of thrush in his oropharynx when I examined him but the differential for potential esophageal pathology in a patient with a CD4 count is low but certainly extensive including candidal esophagitis HSV CMV infection etc. I  had recommended GI consultation but GI are not wanting to do anything aggressive with this patient at this point time.  That being the case I will start him on empiric [place] and then therapy for possible esophageal thrush to see if this alleviates what he told me today  was problems with dysphagia  Would recommend observing what he actually eats calorie counts etc.   #3 HIV/AIDS:  While he has a severely depressed CD4 cell count I would note that it has risen from being undetectable from being 45 which is a fairly dramatic increase.  He appears by numbers in our system to have been virologically suppressed on 2021.  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  Goals of care: Continued desire for aggressive care.  I will therefore add fostemsavir if we have an in-house\\  I spent 37  minutes with the patient including  personally reviewing along with review of medical records in preparation for the visit and during the visit and in coordination of his care.    LOS: 18 days   Kurt Foley 11/28/2021, 7:30 PM

## 2021-11-28 NOTE — Progress Notes (Signed)
Nutrition Follow-up  DOCUMENTATION CODES:   Not applicable  INTERVENTION:  - continue Ensure TID. - recommend changing timing of marinol to 8 AM and 3 PM daily d/t peak effectiveness occurring 2-4 hours after ingestion--sent secure chat to MD to request change. - re-weigh patient today.   NUTRITION DIAGNOSIS:   Increased nutrient needs related to acute illness, chronic illness as evidenced by estimated needs. -ongoing  GOAL:   Patient will meet greater than or equal to 90% of their needs -unmet  MONITOR:   PO intake, Supplement acceptance, Labs, Weight trends  ASSESSMENT:   46 y.o. male with medical history of substance use, RUL mass pending bronch by Pulmonology to evaluate, HIV/AIDS, PML, MAI, emphysema, CVA with residual R-sided hemiparesis, and depression. He was admitted 5/20-5/24 due to shortness of breath and at that time was noted at that time to have DVT and PE with R-sided heart strain. He returned to the ED due to ongoing shortness of breath, weakness,  cough which is sometimes painful, sensation of his heart racing, and decreased urine output with concern for dehydration. Patient was admitted for SIRS.  Last RD follow-up was on 6/14 and since that time only 3 meal completion percentages documented: 80% of breakfast on 6/15, 0% of breakfast on 6/16, 25% of lunch on 6/18.  Patient walking in the hall with PT.  Able to talk with RN who shares that patient has been accepting Ensure, he refused breakfast, and lunch tray went mainly untouched (patient pretended he was going to eat but it did not appear that he ate much if anything from the tray).   He has not been weighed since 6/9. Non-pitting edema to RUE and BLE documented in the edema section of flow sheet.   Notes indicate patient with very guarded prognosis.    Labs reviewed; Alk Phos elevated.  Medications reviewed; 1 tablet vitamin B complex with vitamin C/day, 5 mg marinol BID--dose increased on 6/15, 15 mg  remeron/day, 40 mg oral protonix/day.   Diet Order:   Diet Order             Diet regular Room service appropriate? Yes; Fluid consistency: Thin  Diet effective now                   EDUCATION NEEDS:   No education needs have been identified at this time  Skin:  Skin Assessment: Reviewed RN Assessment  Last BM:  6/19 (type 6 x1, small amount)  Height:   Ht Readings from Last 1 Encounters:  11/09/21 $RemoveB'5\' 6"'[id number]$  (1.676 m)    Weight:   Wt Readings from Last 1 Encounters:  11/18/21 69.2 kg     BMI:  Body mass index is 24.62 kg/m.  Estimated Nutritional Needs:  Kcal:  2050-2250 kcal Protein:  102-115 grams Fluid:  >/= 2.2 L/day     Kurt Matin, MS, RD, LDN, CNSC Registered Dietitian II Inpatient Clinical Nutrition RD pager # and on-call/weekend pager # available in [facility]

## 2021-11-29 DIAGNOSIS — R531 Weakness: Secondary | ICD-10-CM | POA: Diagnosis not present

## 2021-11-29 DIAGNOSIS — R6510 Systemic inflammatory response syndrome (SIRS) of non-infectious origin without acute organ dysfunction: Secondary | ICD-10-CM | POA: Diagnosis not present

## 2021-11-29 DIAGNOSIS — R7989 Other specified abnormal findings of blood chemistry: Secondary | ICD-10-CM | POA: Diagnosis not present

## 2021-11-29 DIAGNOSIS — Z8673 Personal history of transient ischemic attack (TIA), and cerebral infarction without residual deficits: Secondary | ICD-10-CM | POA: Diagnosis not present

## 2021-11-29 LAB — COMPREHENSIVE METABOLIC PANEL
ALT: 43 U/L (ref 0–44)
AST: 51 U/L — ABNORMAL HIGH (ref 15–41)
Albumin: 2.8 g/dL — ABNORMAL LOW (ref 3.5–5.0)
Alkaline Phosphatase: 276 U/L — ABNORMAL HIGH (ref 38–126)
Anion gap: 11 (ref 5–15)
BUN: 11 mg/dL (ref 6–20)
CO2: 18 mmol/L — ABNORMAL LOW (ref 22–32)
Calcium: 10.3 mg/dL (ref 8.9–10.3)
Chloride: 106 mmol/L (ref 98–111)
Creatinine, Ser: 0.98 mg/dL (ref 0.61–1.24)
GFR, Estimated: >60 mL/min (ref >60)
Glucose, Bld: 90 mg/dL (ref 70–99)
Potassium: 4.3 mmol/L (ref 3.5–5.1)
Sodium: 135 mmol/L (ref 135–145)
Total Bilirubin: 0.9 mg/dL (ref 0.3–1.2)
Total Protein: 7.8 g/dL (ref 6.5–8.1)

## 2021-11-29 LAB — CBC WITH DIFFERENTIAL/PLATELET
Abs Immature Granulocytes: 0.18 10*3/uL — ABNORMAL HIGH (ref 0.00–0.07)
Basophils Absolute: 0 10*3/uL (ref 0.0–0.1)
Basophils Relative: 1 %
Eosinophils Absolute: 0 10*3/uL (ref 0.0–0.5)
Eosinophils Relative: 1 %
HCT: 28.7 % — ABNORMAL LOW (ref 39.0–52.0)
Hemoglobin: 8.8 g/dL — ABNORMAL LOW (ref 13.0–17.0)
Immature Granulocytes: 3 %
Lymphocytes Relative: 10 %
Lymphs Abs: 0.6 10*3/uL — ABNORMAL LOW (ref 0.7–4.0)
MCH: 27.8 pg (ref 26.0–34.0)
MCHC: 30.7 g/dL (ref 30.0–36.0)
MCV: 90.5 fL (ref 80.0–100.0)
Monocytes Absolute: 0.6 10*3/uL (ref 0.1–1.0)
Monocytes Relative: 9 %
Neutro Abs: 5.1 10*3/uL (ref 1.7–7.7)
Neutrophils Relative %: 76 %
Platelets: 497 10*3/uL — ABNORMAL HIGH (ref 150–400)
RBC: 3.17 MIL/uL — ABNORMAL LOW (ref 4.22–5.81)
RDW: 16 % — ABNORMAL HIGH (ref 11.5–15.5)
WBC: 6.6 10*3/uL (ref 4.0–10.5)
nRBC: 0 % (ref 0.0–0.2)

## 2021-11-29 LAB — PHOSPHORUS: Phosphorus: 3.9 mg/dL (ref 2.5–4.6)

## 2021-11-29 LAB — MAGNESIUM: Magnesium: 2 mg/dL (ref 1.7–2.4)

## 2021-11-29 MED ORDER — NYSTATIN 100000 UNIT/ML MT SUSP
5.0000 mL | Freq: Four times a day (QID) | OROMUCOSAL | Status: DC
  Administered 2021-11-29 – 2021-12-07 (×31): 500000 [IU] via OROMUCOSAL
  Filled 2021-11-29 (×31): qty 5

## 2021-11-29 MED ORDER — SODIUM CHLORIDE 0.9 % IV SOLN
150.0000 mg | INTRAVENOUS | Status: DC
Start: 2021-11-30 — End: 2021-11-30
  Administered 2021-11-29: 150 mg via INTRAVENOUS
  Filled 2021-11-29: qty 7.5

## 2021-11-29 NOTE — Progress Notes (Signed)
Daily Progress Note   Patient Name: Kurt Foley       Date: 11/29/2021 DOB: 1976-05-01  Age: 46 y.o. MRN#: [id number] Attending Physician: Kerney Elbe, DO Primary Care Physician: Gildardo Pounds, NP Admit Date: 11/09/2021  Reason for Consultation/Follow-up: Establishing goals of care  Subjective: I saw and examined Kurt Foley.  He was lying in bed in no distress time my encounter.  We discussed pain management.  He is consistently using oxycodone 30m TID. Pain is controlled.  He is worried about his appetite. Minimal PO intake.  Length of Stay: 19  Current Medications: Scheduled Meds:   abacavir-dolutegravir-lamiVUDine  1 tablet Oral Daily   azithromycin  500 mg Oral Daily   B-complex with vitamin C  1 tablet Oral Daily   dronabinol  5 mg Oral BID AC   ethambutol  800 mg Oral Daily   feeding supplement  237 mL Oral TID BM   fostemsavir tromethamine  600 mg Oral BID   ketoconazole   Topical BID   metoprolol succinate  100 mg Oral q morning   mirtazapine  15 mg Oral QHS   mometasone-formoterol  2 puff Inhalation BID   pantoprazole  40 mg Oral QAC breakfast   QUEtiapine  200 mg Oral QHS   rifabutin  300 mg Oral Daily   rivaroxaban  20 mg Oral Q supper   rosuvastatin  5 mg Oral q morning   sodium chloride flush  3 mL Intravenous Q12H   sulfamethoxazole-trimethoprim  1 tablet Oral Daily   umeclidinium bromide  1 puff Inhalation Daily    Continuous Infusions:  micafungin (MYCAMINE) 50 mg in sodium chloride 0.9 % 100 mL IVPB 50 mg (11/28/21 2327)    PRN Meds: acetaminophen (TYLENOL) oral liquid 160 mg/5 mL, acetaminophen **OR** acetaminophen, baclofen, chlorpheniramine-HYDROcodone, guaiFENesin-dextromethorphan, hydrALAZINE, ibuprofen, ipratropium-albuterol, metoprolol  tartrate, morphine injection, oxyCODONE, polyethylene glycol, senna-docusate  Physical Exam Constitutional:      General: He is not in acute distress.    Appearance: He is ill-appearing.  Pulmonary:     Effort: Pulmonary effort is normal.  Skin:    General: Skin is warm and dry.  Neurological:     Mental Status: He is alert and oriented to person, place, and time.             Vital Signs: BP 92/69 (BP Location: Left Wrist)   Pulse 98   Temp 98.3 F (36.8 C) (Oral)   Resp 15   Ht _0  (1.676 m)   Wt 65.5 kg   SpO2 100%   BMI 23.31 kg/m  SpO2: SpO2: 100 % O2 Device: O2 Device: Room Air O2 Flow Rate: O2 Flow Rate (L/min): 2 L/min  Intake/output summary:  Intake/Output Summary (Last 24 hours) at 11/29/2021 0646 Last data filed at 11/29/2021 0400 Gross per 24 hour  Intake 260.12 ml  Output 500 ml  Net -239.88 ml    LBM: Last BM Date : 11/27/21 Baseline Weight: Weight: 67.9 kg Most recent weight: Weight: 65.5 kg       Palliative Assessment/Data: PPS 40%      Patient Active Problem List   Diagnosis Date Noted   Oropharyngeal dysphagia  Goals of care, counseling/discussion    Weakness    Encounter for palliative care    Mediastinal lymphadenopathy    Community acquired pneumonia of right lung 11/10/2021   SIRS (systemic inflammatory response syndrome) (HCC) 11/09/2021   Pulmonary embolism ([place]) 10/29/2021   Tinea corporis and facialis 10/07/2021   Acute respiratory infection    Tinea barbae    Dyspnea 10/06/2021   Mass of upper lobe of right lung 10/06/2021   Cough 09/14/2021   Insomnia 08/08/2021   Pancytopenia ([place]) 08/08/2021   Abnormal LFTs 08/08/2021   Neutropenia ([place]) 08/08/2021   Tachycardia    Encephalitis 07/22/2021   Debility 07/20/2021   Hypokalemia 07/19/2021   Hyponatremia 07/18/2021   Malnutrition of moderate degree 07/15/2021   FTT (failure to thrive) in adult 07/13/2021   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/2021   CVA (cerebral vascular accident) ([place]) 07/08/2021   History of CVA with residual right hemiparesis 05/25/2021   Rash of mouth present on examination 03/25/2021   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[place]) 03/25/2021   Need for prophylactic vaccination and inoculation against smallpox 03/25/2021   Numbness and tingling of right arm 03/24/2021   AIDS (acquired immune deficiency syndrome) ([place]) 03/05/2021   Emphysema, unspecified ([place]) 03/05/2021   Cocaine-induced mood disorder ([place])    Tinea pedis 08/06/2019   At risk for opportunistic infections 07/14/2019   AKI (acute kidney injury) ([place]) 06/02/2019   Generalized weakness 06/02/2019   Cocaine abuse ([place]) 11/25/2018   Major depressive disorder, 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/2018   Substance induced mood disorder ([place]) 09/16/2015   Cocaine use disorder, severe, dependence ([place])    Cannabis use disorder, severe, dependence ([place]) 04/09/2015   Severe recurrent major depression without psychotic features ([place]) 04/08/2015   Alcohol abuse 04/08/2015   Screening examination for venereal disease 03/25/2015   Encounter for long-term (current) use of medications 03/25/2015   Tobacco abuse    Visual disturbance 11/18/2013   Human immunodeficiency virus (HIV) disease ([place]) 10/30/2013   Atopic dermatitis 10/30/2013    Palliative Care Assessment & Plan   HPI: 46 year old gentleman with advanced AIDS, disseminated MAC, substance use, depression prior stroke with residual right-sided weakness, history of pulmonary embolism, admitted with cough and palpitations.  CT scan concerning for possible lung mass, underwent bronchoscopy, infectious disease following.  Has right upper lobe mass.  Bronchoscopy brushings negative, consideration was being given to pursuing biopsy however plan is to proceed with outpatient PET scan.  Hospital course complicated by SIRS, patient off-and-on spiking fevers.  Remains  on azithromycin. Palliative medicine team following for CODE STATUS and broad goals of care discussions  Assessment: Goals continue for full code/full scope care  Recommendations/Plan: Pain: Continue oxycodone 5 to 10 mg every 4 hours as needed for breakthrough pain.   Full code full scope  Goals of Care and Additional Recommendations: Limitations on Scope of Treatment: Full Scope Treatment  Code Status: Full code  Discharge Planning: To Be Determined  Care plan was discussed with patient.  Thank you for allowing the Palliative Medicine Team to assist in the care of this patient.  Lane Hacker, DO Palliative Medicine  Time:50 min

## 2021-11-29 NOTE — Plan of Care (Signed)
  Problem: Education: Goal: Knowledge of General Education information will improve Description: Including pain rating scale, medication(s)/side effects and non-pharmacologic comfort measures Outcome: Progressing   Problem: Health Behavior/Discharge Planning: Goal: Ability to manage health-related needs will improve Outcome: Progressing   Problem: Activity: Goal: Risk for activity intolerance will decrease Outcome: Progressing   

## 2021-11-29 NOTE — Progress Notes (Addendum)
NUTRITION NOTE  Full follow-up note yesterday at 1519.  Consult received this AM for assessment of nutrition requirements and status and for 48 hour Calorie Count.   Able to talk with Attending, Palliative Care MD, and ID MD via secure chat this AM about medical course and interventions.  RD will follow-up 6/21 and 6/22 with Calorie Count results. Unclear at this time if nutrition support will be pursued.   RN aware of Calorie Count order. She shares that patient refused breakfast this AM and that lunch order has been placed but not yet arrived.     Trenton Gammon, MS, RD, LDN, CNSC Registered Dietitian II Inpatient Clinical Nutrition RD pager # and on-call/weekend pager # available in [facility]

## 2021-11-29 NOTE — Progress Notes (Addendum)
Subjective:  He climbs to be hungry but he is also refusing the food that he has been given per nursing notes   Antibiotics:  Anti-infectives (From admission, onward)    Start     Dose/Rate Route Frequency Ordered Stop   11/30/21 0000  micafungin (MYCAMINE) 150 mg in sodium chloride 0.9 % 100 mL IVPB        150 mg 107.5 mL/hr over 1 Hours Intravenous Every 24 hours 11/29/21 1340     11/29/21 0000  micafungin (MYCAMINE) 50 mg in sodium chloride 0.9 % 100 mL IVPB  Status:  Discontinued        50 mg 102.5 mL/hr over 1 Hours Intravenous Every 24 hours 11/28/21 1939 11/29/21 1340   11/28/21 2200  fostemsavir tromethamine (RUKOBIA) ER tablet 600 mg        600 mg Oral 2 times daily 11/28/21 1944     11/24/21 1000  abacavir-dolutegravir-lamiVUDine (TRIUMEQ) 600-50-300 MG per tablet 1 tablet        1 tablet Oral Daily 11/23/21 1107     11/23/21 1130  piperacillin-tazobactam (ZOSYN) IVPB 3.375 g  Status:  Discontinued        3.375 g 12.5 mL/hr over 240 Minutes Intravenous Every 8 hours 11/23/21 1042 11/28/21 1937   11/20/21 1500  rifabutin (MYCOBUTIN) capsule 300 mg        300 mg Oral Daily 11/20/21 1341     11/20/21 1430  sulfamethoxazole-trimethoprim (BACTRIM DS) 800-160 MG per tablet 1 tablet        1 tablet Oral Daily 11/20/21 1342     11/17/21 0800  vancomycin (VANCOCIN) IVPB 1000 mg/200 mL premix  Status:  Discontinued        1,000 mg 200 mL/hr over 60 Minutes Intravenous Every 12 hours 11/16/21 1807 11/17/21 1048   11/16/21 1830  vancomycin (VANCOREADY) IVPB 1500 mg/300 mL        1,500 mg 150 mL/hr over 120 Minutes Intravenous STAT 11/16/21 1758 11/16/21 2048   11/16/21 1800  ceFEPIme (MAXIPIME) 2 g in sodium chloride 0.9 % 100 mL IVPB  Status:  Discontinued        2 g 200 mL/hr over 30 Minutes Intravenous Every 8 hours 11/16/21 1756 11/17/21 1048   11/11/21 2200  azithromycin (ZITHROMAX) tablet 500 mg        500 mg Oral Daily 11/11/21 1511     11/10/21 2200   azithromycin (ZITHROMAX) 500 mg in sodium chloride 0.9 % 250 mL IVPB  Status:  Discontinued        500 mg 250 mL/hr over 60 Minutes Intravenous Every 24 hours 11/10/21 1316 11/11/21 1511   11/10/21 1400  cefTRIAXone (ROCEPHIN) 2 g in sodium chloride 0.9 % 100 mL IVPB  Status:  Discontinued        2 g 200 mL/hr over 30 Minutes Intravenous Every 24 hours 11/10/21 1316 11/11/21 1511   11/10/21 1000  ethambutol (MYAMBUTOL) tablet 800 mg        800 mg Oral Daily 11/09/21 1840     11/10/21 1000  sulfamethoxazole-trimethoprim (BACTRIM DS) 800-160 MG per tablet 1 tablet  Status:  Discontinued        1 tablet Oral Daily 11/09/21 1840 11/10/21 1316   11/09/21 2200  azithromycin (ZITHROMAX) tablet 500 mg  Status:  Discontinued        500 mg Oral Daily at bedtime 11/09/21 1909 11/10/21 1316   11/09/21 1830  vancomycin (VANCOREADY) IVPB 1500  mg/300 mL        1,500 mg 150 mL/hr over 120 Minutes Intravenous STAT 11/09/21 1817 11/09/21 2157   11/09/21 1830  piperacillin-tazobactam (ZOSYN) IVPB 3.375 g        3.375 g 100 mL/hr over 30 Minutes Intravenous STAT 11/09/21 1817 11/09/21 1932   11/09/21 1700  bictegravir-emtricitabine-tenofovir AF (BIKTARVY) 50-200-25 MG per tablet 1 tablet  Status:  Discontinued        1 tablet Oral Daily 11/09/21 1608 11/23/21 1107       Medications: Scheduled Meds:  abacavir-dolutegravir-lamiVUDine  1 tablet Oral Daily   azithromycin  500 mg Oral Daily   B-complex with vitamin C  1 tablet Oral Daily   dronabinol  5 mg Oral BID AC   ethambutol  800 mg Oral Daily   feeding supplement  237 mL Oral TID BM   fostemsavir tromethamine  600 mg Oral BID   ketoconazole   Topical BID   metoprolol succinate  100 mg Oral q morning   mirtazapine  15 mg Oral QHS   mometasone-formoterol  2 puff Inhalation BID   nystatin  5 mL Mouth/Throat QID   pantoprazole  40 mg Oral QAC breakfast   QUEtiapine  200 mg Oral QHS   rifabutin  300 mg Oral Daily   rivaroxaban  20 mg Oral Q supper    rosuvastatin  5 mg Oral q morning   sodium chloride flush  3 mL Intravenous Q12H   sulfamethoxazole-trimethoprim  1 tablet Oral Daily   umeclidinium bromide  1 puff Inhalation Daily   Continuous Infusions:  [START ON 11/30/2021] micafungin (MYCAMINE) 150 mg in sodium chloride 0.9 % 100 mL IVPB     PRN Meds:.acetaminophen (TYLENOL) oral liquid 160 mg/5 mL, acetaminophen **OR** acetaminophen, baclofen, chlorpheniramine-HYDROcodone, guaiFENesin-dextromethorphan, hydrALAZINE, ibuprofen, ipratropium-albuterol, metoprolol tartrate, morphine injection, oxyCODONE, polyethylene glycol, senna-docusate    Objective: Weight change:   Intake/Output Summary (Last 24 hours) at 11/29/2021 1702 Last data filed at 11/29/2021 1115 Gross per 24 hour  Intake 176.54 ml  Output 1150 ml  Net -973.46 ml    Blood pressure 112/82, pulse 93, temperature 98.3 F (36.8 C), resp. rate (!) 22, height 5\' 6"  (1.676 m), weight 65.5 kg, SpO2 95 %. Temp:  [97.7 F (36.5 C)-98.3 F (36.8 C)] 98.3 F (36.8 C) (06/20 1254) Pulse Rate:  [93-103] 93 (06/20 1254) Resp:  [15-22] 22 (06/20 1254) BP: (92-118)/(69-104) 112/82 (06/20 1254) SpO2:  [95 %-100 %] 95 % (06/20 1254)  Physical Exam: Physical Exam HENT:     Head: Normocephalic and atraumatic.  Eyes:     Conjunctiva/sclera: Conjunctivae normal.  Cardiovascular:     Rate and Rhythm: Normal rate and regular rhythm.  Pulmonary:     Effort: Pulmonary effort is normal. No respiratory distress.     Breath sounds: No stridor. No wheezing.  Abdominal:     General: There is no distension.     Palpations: Abdomen is soft.  Musculoskeletal:        General: Normal range of motion.     Cervical back: Normal range of motion and neck supple.  Skin:    General: Skin is warm and dry.  Neurological:     Mental Status: He is alert and oriented to person, place, and time.  Psychiatric:        Mood and Affect: Mood normal.        Speech: Speech is delayed.         Behavior: Behavior is slowed.  Thought Content: Thought content normal.        Cognition and Memory: Cognition is impaired. He exhibits impaired remote memory.        Judgment: Judgment normal.      CBC:    BMET Recent Labs    11/28/21 0344 11/29/21 0443  NA 136 135  K 4.3 4.3  CL 105 106  CO2 19* 18*  GLUCOSE 94 90  BUN 10 11  CREATININE 0.86 0.98  CALCIUM 10.0 10.3      Liver Panel  Recent Labs    11/28/21 0344 11/29/21 0443  PROT 7.5 7.8  ALBUMIN 2.6* 2.8*  AST 52* 51*  ALT 43 43  ALKPHOS 273* 276*  BILITOT 0.7 0.9        Sedimentation Rate No results for input(s): "ESRSEDRATE" in the last 72 hours. C-Reactive Protein No results for input(s): "CRP" in the last 72 hours.  Micro Results: Recent Results (from the past 720 hour(s))  Urine Culture     Status: Abnormal   Collection Time: 11/09/21  3:01 PM   Specimen: Urine, Clean Catch  Result Value Ref Range Status   Specimen Description   Final    URINE, CLEAN CATCH Performed at [facility], [address], [place], [place] [address]    Special Requests   Final    NONE Performed at [facility], [address] [place], [place] [address]    Culture (A)  Final    <10,000 COLONIES/mL INSIGNIFICANT GROWTH Performed at [facility], [address] [place], [place] [address]    Report Status 11/10/2021 FINAL  Final  SARS Coronavirus 2 by RT PCR (hospital order, performed in [facility] hospital lab) *cepheid single result test* Anterior Nasal Swab     Status: None   Collection Time: 11/09/21  3:01 PM   Specimen: Anterior Nasal Swab  Result Value Ref Range Status   SARS Coronavirus 2 by RT PCR NEGATIVE NEGATIVE Final    Comment: (NOTE) SARS-CoV-2 target nucleic acids are NOT DETECTED.  The SARS-CoV-2 RNA is generally detectable in upper and lower respiratory specimens during the acute phase of infection. The lowest concentration of SARS-CoV-2  viral copies this assay can detect is 250 copies / mL. A negative result does not preclude SARS-CoV-2 infection and should not be used as the sole basis for treatment or other patient management decisions.  A negative result may occur with improper specimen collection / handling, submission of specimen other than nasopharyngeal swab, presence of viral mutation(s) within the areas targeted by this assay, and inadequate number of viral copies (<250 copies / mL). A negative result must be combined with clinical observations, patient history, and epidemiological information.  Fact Sheet for Patients:   RoadLapTop.co.za  Fact Sheet for Healthcare Providers: http://kim-miller.com/  This test is not yet approved or  cleared by the [place] FDA and has been authorized for detection and/or diagnosis of SARS-CoV-2 by FDA under an Emergency Use Authorization (EUA).  This EUA will remain in effect (meaning this test can be used) for the duration of the COVID-19 declaration under Section 564(b)(1) of the Act, 21 U.S.C. section 360bbb-3(b)(1), unless the authorization is terminated or revoked sooner.  Performed at [facility], [address] [place], [place] [address]   Blood Culture (routine x 2)     Status: None   Collection Time: 11/09/21  3:38 PM   Specimen: BLOOD  Result Value Ref Range Status  Specimen Description   Final    BLOOD RIGHT ANTECUBITAL Performed at [facility], [address], [place], [place] [address]    Special Requests   Final    BOTTLES DRAWN AEROBIC AND ANAEROBIC Blood Culture results may not be optimal due to an inadequate volume of blood received in culture bottles Performed at [facility], [address], [place], [place] [address]    Culture   Final    NO GROWTH 5 DAYS Performed at [facility], [address], [place], [place] [address]     Report Status 11/14/2021 FINAL  Final  Blood Culture (routine x 2)     Status: None   Collection Time: 11/09/21  5:02 PM   Specimen: BLOOD  Result Value Ref Range Status   Specimen Description   Final    BLOOD SITE NOT SPECIFIED Performed at [facility], [address], [place], [place] [address]    Special Requests   Final    BOTTLES DRAWN AEROBIC AND ANAEROBIC Blood Culture results may not be optimal due to an inadequate volume of blood received in culture bottles Performed at [facility], [address], [place], [place] [address]    Culture   Final    NO GROWTH 5 DAYS Performed at [facility], [address] [place], [place] [address]    Report Status 11/14/2021 FINAL  Final  Acid Fast Smear (AFB)     Status: None   Collection Time: 11/12/21  1:12 AM   Specimen: Vein; Blood  Result Value Ref Range Status   AFB Specimen Processing Concentration  Final    Comment: (NOTE) Performed At: [facility] [address] [place], [place] [id number] Jolene Schimke MD VF:[id number]    Acid Fast Smear QNSAFB  Final    Comment: (NOTE) Test not performed. AFB Smear not performed due to specimen source (blood) or insufficient specimen.    Source (AFB) BLD  Final    Comment: Performed at [facility], [address] [place], [place] [address]  Fungus Culture With Stain     Status: None (Preliminary result)   Collection Time: 11/14/21  2:42 PM   Specimen: Bronchial Washing, Right; Respiratory  Result Value Ref Range Status   Fungus Stain Final report  Final    Comment: (NOTE) Performed At: [facility] [address] [place], [place] [id number] Jolene Schimke MD [id number]    Fungus (Mycology) Culture PENDING  Incomplete   Fungal Source BRONCHIAL ALVEOLAR LAVAGE  Final    Comment: Performed at [facility], [address] [place], [place] [address]  Culture, Respiratory w Gram  Stain     Status: None   Collection Time: 11/14/21  2:42 PM   Specimen: Bronchial Washing, Right; Respiratory  Result Value Ref Range Status   Specimen Description   Final    BRONCHIAL ALVEOLAR LAVAGE RUL Performed at [facility], [address], [place], [place] [address]    Special Requests   Final    NONE Performed at [facility], [address], [place], [place] [address]    Gram Stain NO WBC SEEN NO ORGANISMS SEEN   Final   Culture   Final    NO GROWTH 2 DAYS Performed at [facility], [address], [place], [place] [address]    Report Status 11/17/2021 FINAL  Final  Acid Fast Smear (AFB)  Status: None   Collection Time: 11/14/21  2:42 PM   Specimen: Bronchial Washing, Right; Respiratory  Result Value Ref Range Status   AFB Specimen Processing Concentration  Final   Acid Fast Smear Negative  Final    Comment: (NOTE) Performed At: [facility] [address] [place], [place] [id number] Jolene Schimke MD WU:[id number]    Source (AFB) BRONCHIAL ALVEOLAR LAVAGE  Final    Comment: Performed at [facility], [address], [place], [place] [address]  Anaerobic culture w Gram Stain     Status: None   Collection Time: 11/14/21  2:42 PM   Specimen: Bronchial Washing, Right; Respiratory  Result Value Ref Range Status   Specimen Description   Final    BRONCHIAL ALVEOLAR LAVAGE RUL Performed at [facility], [address], [place], [place] [address]    Special Requests   Final    NONE Performed at [facility], [address] [place], [place] [address]    Culture   Final    NO ANAEROBES ISOLATED Performed at [facility], [address], [place], [place] [address]    Report Status 11/20/2021 FINAL  Final  Fungus Culture Result     Status: None   Collection Time: 11/14/21  2:42 PM  Result Value Ref Range Status   Result 1 Comment  Final    Comment:  (NOTE) KOH/Calcofluor preparation:  no fungus observed. Performed At: [facility] [address] [place], [place] [id number] Jolene Schimke MD UU:[id number]   Culture, blood (Routine X 2) w Reflex to ID Panel     Status: None   Collection Time: 11/16/21  7:00 PM   Specimen: BLOOD LEFT FOREARM  Result Value Ref Range Status   Specimen Description BLOOD LEFT FOREARM  Final   Special Requests IN PEDIATRIC BOTTLE Blood Culture adequate volume  Final   Culture   Final    NO GROWTH 5 DAYS Performed at [facility], [address], [place], [place] [address]    Report Status 11/21/2021 FINAL  Final  Culture, blood (Routine X 2) w Reflex to ID Panel     Status: None   Collection Time: 11/16/21  7:00 PM   Specimen: BLOOD LEFT WRIST  Result Value Ref Range Status   Specimen Description   Final    BLOOD LEFT WRIST BLOOD Performed at [facility], [address] [place], [place] [address]    Special Requests IN PEDIATRIC BOTTLE Blood Culture adequate volume  Final   Culture   Final    NO GROWTH 5 DAYS Performed at [facility], [address] [place], [place] [address]    Report Status 11/21/2021 FINAL  Final  Acid Fast Smear (AFB)     Status: None   Collection Time: 11/19/21 11:44 AM   Specimen: Vein; Blood  Result Value Ref Range Status   AFB Specimen Processing Concentration  Final    Comment: (NOTE) Performed At: [facility] [address] [place], [place] [id number] Jolene Schimke MD YS:[id number]    Acid Fast Smear QNSAFB  Final    Comment: (NOTE) Test not performed. AFB Smear not performed due to specimen source (blood) or insufficient specimen.    Source (AFB) BLOOD  Final    Comment: Performed at [facility], [address], [place], [place] [address]  Culture, blood (Routine X 2) w Reflex to ID Panel     Status: None  Collection Time: 11/21/21  4:18 PM   Specimen: BLOOD  Result Value Ref Range  Status   Specimen Description   Final    BLOOD BLOOD LEFT FOREARM Performed at [facility], [address], [place], [place] [address]    Special Requests   Final    BOTTLES DRAWN AEROBIC ONLY Blood Culture results may not be optimal due to an inadequate volume of blood received in culture bottles Performed at [facility], [address], [place], [place] [address]    Culture   Final    NO GROWTH 5 DAYS Performed at [facility], [address] [place], [place] [address]    Report Status 11/26/2021 FINAL  Final  Culture, blood (Routine X 2) w Reflex to ID Panel     Status: None   Collection Time: 11/21/21  4:18 PM   Specimen: BLOOD  Result Value Ref Range Status   Specimen Description   Final    BLOOD BLOOD LEFT WRIST Performed at [facility], [address] [place], [place] [address]    Special Requests   Final    IN PEDIATRIC BOTTLE Blood Culture adequate volume Performed at [facility], [address] [place], [place] [address]    Culture   Final    NO GROWTH 5 DAYS Performed at [facility], [address] [place] [address], [place], [place] [address]    Report Status 11/26/2021 FINAL  Final    Studies/Results: No results found.    Assessment/Plan:  INTERVAL HISTORY:    Patient is giving conflicting stories about appetite and dysphagia   Principal Problem:   SIRS (systemic inflammatory response syndrome) (HCC) Active Problems:   Human immunodeficiency virus (HIV) disease (HCC)   Severe recurrent major depression without psychotic features (HCC)   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HCC)   Emphysema, unspecified (HCC)   History of CVA with residual right hemiparesis   Tachycardia   Insomnia   Abnormal LFTs   Mass of upper lobe of right lung   Pulmonary embolism (HCC)   Community acquired pneumonia of right lung   Mediastinal lymphadenopathy   Goals of care,  counseling/discussion   Weakness   Encounter for palliative care   Oropharyngeal dysphagia    Kurt Foley is a 46 y.o. 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  It improved with empiric Zosyn despite a clear obvious target for the antibiotic.  I have stopped this antibiotic however continue to monitor him off antibacterial therapy  #2 reported dysphagia: History is not completely consistent I started micafungin yesterday and we contemplated using oral fluconazole but we are running into problems with drug drug interactions rifabutin so we will use nystatin swish and swallow for now as empiric therapy for possible candidal esophagitis   Would recommend observing what he actually eats calorie counts etc.   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  #5 PML with IRIS : This was diagnosed in January of 2023 while he was successfully virologically controlled and I suspect IRIS was a good portion of this pathology  Goals of care: He continues to express desire for aggressive care when I have spoken with him   I spent 37  minutes with the patient discussing goals of care his HIV treatment history rationale for current medications including the desiccation of Rukobia personally reviewing imaging along with review of medical records in preparation for the visit and during the visit and in coordination of his care.    LOS: 19 days    Acey Lav 11/29/2021, 5:02 PM

## 2021-11-29 NOTE — Progress Notes (Signed)
PROGRESS NOTE    Kurt Foley  ATF:[id number] DOB: 1975/11/22 DOA: 11/09/2021 PCP: Gildardo Pounds, NP   Brief Narrative:  46 year old with advanced AIDS, disseminated MAC substance abuse, depression, prior CVA with residual right-sided weakness, pulmonary embolism on Xarelto admitted for cough and palpitations.  Repeat CT showed abnormal lung parenchyma.  He was seen by pulmonary, cultures remain negative.  Underwent bronchoscopy with EBUS on 6/5 with subcarinal lymph node sampling and brushing with BAL of right upper lobe mass.  Brushings remain negative, IR consulted for CT-guided biopsy.  Patient off-and-on spiking fever, discussed with ID and this is likely due to MAC.  Patient has also been seen by palliative care service, wishes to be full code. Unforturnately continues to spike fevers daily leading to tachycardia.   ID is now changing antibiotics and trying IV Zosyn.  Continues to have temperatures and tachycardia along with some back pain but fever seems improved this morning and ID recommends continuing current antibiotics and regimen at this time.  Palliative care is increasing his blood but now and PT OT recommending outpatient PT.  Given his recurrent fevers ID is now checking a CT of the chest abdomen pelvis to see if there is anything new that they could consider; otherwise that they are going to continue his current plan however they have changed his HIV medication from [place] to [place].  He has not changed his morphine p.o. to oxycodone.  Given his poor appetite we will start him on D5W half-normal saline at 75 MLS per hour and also given 500 mill bolus given his slight hypotension.  CT Scan of the Chest/Abd/Pelvis done and showed "Worsening nodal disease since recent imaging from May in April in the chest and abdomen though improved when compared to January. Findings may reflect either sequela of age related infection or could potentially be related to  neoplasm/lymphoproliferative disorder. Would suggest correlation with PET imaging to select a site for sampling. This could also be utilized to assess pulmonary findings that have raise concern on recent imaging. More  masslike appearance of RIGHT upper lobe changes and LEFT upper lobe nodules as discussed above, consideration for  pulmonary neoplasm as discussed on recent imaging. Signs of pulmonary emphysema. Three-vessel coronary artery disease.  Low-attenuation cardiac blood pool compatible with anemia.  Stranding about jejunal loops in the upper abdomen is of uncertain significance but is associated with increasing nodal disease, perhaps related to inflammation or lymphatic congestion. Attention on follow-up.  Bilateral perinephric stranding which was not evident on most recent abdominal imaging. Correlation with urinalysis is suggested. Aortic Atherosclerosis (ICD10-I70.0) and Emphysema."  ID recommends continuing current plan of care transitioning to oral Augmentin for 5 days at discharge.  ID feels that they do not expect any meaningful recovery but patient wishes to continue his current treatments and wants to consider physical rehabilitation.  We will consult PT OT for further evaluation recommendations and they are recommending outpatient PT and OT.  [name] infectious diseases reached out to me to see if we can get a GI consult for this patient given his nausea vomiting and difficulty with his food.  GI evaluated and feels that multiple medications can account for his nausea vomiting but he does not think this is a primary GI process and feels that the patient's main concern is singultus.  GI has recommended hospice but is declined.  GI feels there is no need for endoscopy currently but if there is concern for infectious esophagitis empiric treatment such as fluconazole  to be considered we will defer this to infectious diseases.  We will place the patient on scheduled antiemetics and [name] spoke with  [name].  Nutrition recommending changing Marinol to 8 AM and 3 PM daily for peak effectiveness.  Is unclear how much she is actually ingesting and taking in for his food and oral intake.  Calorie count has been initiated and ID has started the patient on micafungin given that he has reported dysphagia and cannot essentially rule out thrush; his CD4 count is improving.  Palliative is involved for further goals of care discussion    Assessment and Plan:  SIRS with recurrent fevers History of disseminated MAC infection - Seen by ID.  This is likely secondary to disseminated MAC history.  Current regimen-Biktarvy, azithromycin and ethambutol. Now on Rifabutin as well but given his interactions with drugs he was changed to Triumeq -ID feels that he has no real source of his fever but could be secondary to his disseminated MRSA infection -ID is now adding IV Zosyn for empiric treatment of occult infection and monitoring fever and fever continues to recur and continues to have high temperatures even though he had a period where he is fever free but likely in the anti-inflammatory effects of antibiotics -ID is now ordering a CT of the chest abdomen abdomen and pelvis to look for anything new that they can consider to treat including new atypical pneumonia, occult abscess or more lymphadenopathy -CT Scan of the Chest/Abd/Pelvis done and showed "Worsening nodal disease since recent imaging from May in April in the chest and abdomen though improved when compared to January. Findings may reflect either sequela of age related infection or could potentially be related to neoplasm/lymphoproliferative disorder. Would suggest correlation with PET imaging to select a site for sampling. This could also be utilized to assess pulmonary findings that have raise concern on recent imaging. More  masslike appearance of RIGHT upper lobe changes and LEFT upper lobe nodules as discussed above, consideration for  pulmonary  neoplasm as discussed on recent imaging. Signs of pulmonary emphysema. Three-vessel coronary artery disease.  Low-attenuation cardiac blood pool compatible with anemia.  Stranding about jejunal loops in the upper abdomen is of uncertain significance but is associated with increasing nodal disease, perhaps related to inflammation or lymphatic congestion. Attention on follow-up.  Bilateral perinephric stranding which was not evident on most recent abdominal imaging. Correlation with urinalysis is suggested. Aortic Atherosclerosis (ICD10-I70.0) and Emphysema." -ID recommended GI evaluation given inability to keep food down and nausea and vomiting but GI evaluated and feels that he has no evidence of this and the patient's main issue is singultus.  [name] does not feel that his nausea vomiting is a primary GI process and recommending symptomatic treatment and scheduling antiemetics as well as possible treatment with fluconazole if concern for infectious esophagitis -GI signed off the case and has recommended hospice but patient has declined; palliative continues to have goals of care discussion; ID started the patient on micafungin -ID recommends continuing current plan of care as his fever has improved and will continue IV Zosyn for now and transition to oral Augmentin for 5 days at discharge but will need PT and OT to further evaluate and treat given that he is extremely weak; ID has now stopped his Zosyn to evaluate for recurrence of fevers and watch him off of antibiotics currently -PT OT recommending home health PT and OT   Sinus Tachycardia with fevers.  - Continues to spike fevers  and off-and-on having tachycardia.  Continue Toprol-XL 100 mg daily.  IV fluid hydration has stopped but is now resumed at D5 half-normal saline at 75 mils per hour and will give another 500 bolus -Also on IV metoprolol as needed   Right upper lobe mass Dyspnea with shortness of breath  -Status post bronchoscopy with EBUS  on 6/5.  Brushings remain negative.  Case discussed with IR who recommends outptn PET scan. Spoke with Dr Lamonte Sakai from [facility], who also reviewed images and recommended outptn PET followed by follow up with Dr Valeta Harms for discussion of possible Navigation Bronch. There may be a component of infection which may clear with time?  -Getting a CT of the chest abdomen pelvis as above -On IV Zosyn now we will continue for now -He will need follow-up on his mass noted in the outpatient setting   AKI Metabolic acidosis - AKI has resolved resolved -Patient's BUN/creatinine is now stable at 11/0.98 -Patient's metabolic acidosis is improving slightly and he has a CO2 of 18, anion gap of 11, chloride level of 106 -Avoid nephrotoxic medications, contrast dyes, hypotension and adjust medications   Pulmonary Embolus Has a history of DVT -Transitioned back to Xarelto.   History of CVA -Residual right-sided weakness with right hand and arm contractures.  On statin -We will get PT and OT to further evaluate and treat  Emphysema -Bronchodilators to be continued.  HIV with history of MAI. -Infectious diseases following.  currently on Triumeq and ethambutol and rifabutin. On azithromycin daily as well and now going on IV Zosyn -He is on 3 drugs and they will continue and they feel it is difficult with his underlying poor immune recovery -See above  Substance use -Counseled to quit using.  Depression -Continue with on Seroquel and Remeron   Hyponatremia -Patient's sodium is 133 yesterday and now today is 136 yesterday and today is 135 -Monitor and trend and repeat CMP in a.m.   Normocytic Anemia -Patient's hemoglobin/hematocrit is now 7.7/26.9 -> 7.5/24.8 -> 8.1/27.5 -> 8.0/26.7 -> 7.9/26.6 and is stable yesterday at 7.9/25.9 and today is 8.8/28.7 -Trended down from 9.6/31.4 on 6/8 -Check anemia panel in a.m. -Continue monitor for signs of a bleeding; no overt bleeding noted -Repeat CBC in the  a.m.  GERD -Daily PPI with Pantoprazole 40 mg po Daily   Transaminitis -Trend LFTs -LFTs have been slightly abnormal but have now been improving and AST is now 51 and ALT 43 -Continue monitor and if necessary will obtain a right upper quadrant ultrasound   Goals of care Severe protein calorie malnutrition -Advanced HIV with very guarded prognosis.  -Palliative care following-he wishes to be full code -Supplements.  Marinol added and dose increased today given that he has extremely poor p.o. intake and has now been changed to 8 AM and 3 PM dosing given dietary recommendations -Palliative care has adjusted his medication and is now on Oxycodone IR 5 to 10 mg every 4 hours as needed for moderate-severe pain -Marinol has been increased to 5 mg twice daily -See IV fluid hydration as above given his poor p.o. intake; unclear he when she is actually taking given his mixed reports of oral intake; have consulted nutrition for calorie count and ID has started him on micafungin for esophageal candidiasis   DVT prophylaxis:  rivaroxaban (XARELTO) tablet 20 mg    Code Status: Full Code Family Communication: No family currently at bedside  Disposition Plan:  Level of care: Progressive Status is: Inpatient Remains inpatient appropriate because:  Needs ID clearance and needs further evaluation for his oral intake to see if he is actually eating; calorie count initiated and RD will follow-up in [phone] with results    Consultants:  Infectious diseases Palliative care medicine Pulmonary Interventional radiology Gastroenterology   Procedures:  Procedure(s): Flexible bronchoscopy (35329) Brushing (92426) of the RUL  Bronchial alveolar lavage (83419) of the RUL Endobronchial ultrasound (62229) Transbronchial needle aspiration (79892) of the station 7    He will be underwent a CT of the chest abdomen pelvis  Antimicrobials:  Anti-infectives (From admission, onward)    Start     Dose/Rate  Route Frequency Ordered Stop   11/30/21 0000  micafungin (MYCAMINE) 150 mg in sodium chloride 0.9 % 100 mL IVPB        150 mg 107.5 mL/hr over 1 Hours Intravenous Every 24 hours 11/29/21 1340     11/29/21 0000  micafungin (MYCAMINE) 50 mg in sodium chloride 0.9 % 100 mL IVPB  Status:  Discontinued        50 mg 102.5 mL/hr over 1 Hours Intravenous Every 24 hours 11/28/21 1939 11/29/21 1340   11/28/21 2200  fostemsavir tromethamine (RUKOBIA) ER tablet 600 mg        600 mg Oral 2 times daily 11/28/21 1944     11/24/21 1000  abacavir-dolutegravir-lamiVUDine (TRIUMEQ) 600-50-300 MG per tablet 1 tablet        1 tablet Oral Daily 11/23/21 1107     11/23/21 1130  piperacillin-tazobactam (ZOSYN) IVPB 3.375 g  Status:  Discontinued        3.375 g 12.5 mL/hr over 240 Minutes Intravenous Every 8 hours 11/23/21 1042 11/28/21 1937   11/20/21 1500  rifabutin (MYCOBUTIN) capsule 300 mg        300 mg Oral Daily 11/20/21 1341     11/20/21 1430  sulfamethoxazole-trimethoprim (BACTRIM DS) 800-160 MG per tablet 1 tablet        1 tablet Oral Daily 11/20/21 1342     11/17/21 0800  vancomycin (VANCOCIN) IVPB 1000 mg/200 mL premix  Status:  Discontinued        1,000 mg 200 mL/hr over 60 Minutes Intravenous Every 12 hours 11/16/21 1807 11/17/21 1048   11/16/21 1830  vancomycin (VANCOREADY) IVPB 1500 mg/300 mL        1,500 mg 150 mL/hr over 120 Minutes Intravenous STAT 11/16/21 1758 11/16/21 2048   11/16/21 1800  ceFEPIme (MAXIPIME) 2 g in sodium chloride 0.9 % 100 mL IVPB  Status:  Discontinued        2 g 200 mL/hr over 30 Minutes Intravenous Every 8 hours 11/16/21 1756 11/17/21 1048   11/11/21 2200  azithromycin (ZITHROMAX) tablet 500 mg        500 mg Oral Daily 11/11/21 1511     11/10/21 2200  azithromycin (ZITHROMAX) 500 mg in sodium chloride 0.9 % 250 mL IVPB  Status:  Discontinued        500 mg 250 mL/hr over 60 Minutes Intravenous Every 24 hours 11/10/21 1316 11/11/21 1511   11/10/21 1400  cefTRIAXone  (ROCEPHIN) 2 g in sodium chloride 0.9 % 100 mL IVPB  Status:  Discontinued        2 g 200 mL/hr over 30 Minutes Intravenous Every 24 hours 11/10/21 1316 11/11/21 1511   11/10/21 1000  ethambutol (MYAMBUTOL) tablet 800 mg        800 mg Oral Daily 11/09/21 1840     11/10/21 1000  sulfamethoxazole-trimethoprim (BACTRIM DS) 800-160 MG per  tablet 1 tablet  Status:  Discontinued        1 tablet Oral Daily 11/09/21 1840 11/10/21 1316   11/09/21 2200  azithromycin (ZITHROMAX) tablet 500 mg  Status:  Discontinued        500 mg Oral Daily at bedtime 11/09/21 1909 11/10/21 1316   11/09/21 1830  vancomycin (VANCOREADY) IVPB 1500 mg/300 mL        1,500 mg 150 mL/hr over 120 Minutes Intravenous STAT 11/09/21 1817 11/09/21 2157   11/09/21 1830  piperacillin-tazobactam (ZOSYN) IVPB 3.375 g        3.375 g 100 mL/hr over 30 Minutes Intravenous STAT 11/09/21 1817 11/09/21 1932   11/09/21 1700  bictegravir-emtricitabine-tenofovir AF (BIKTARVY) 50-200-25 MG per tablet 1 tablet  Status:  Discontinued        1 tablet Oral Daily 11/09/21 1608 11/23/21 1107        Subjective: Seen and examined at bedside and thinks he is doing a little bit better but states that he has not been eating very well and continues to have the hiccups.  Complaining of some pain and asking for his pain medication.  Feels okay and seems to be a little bit more improved.  Last tachycardic and not as uncomfortable appearing.  No other concerns or complaints at this time but wanted to get from the bed to the chair.  No other concerns or complaints at this time.  Objective: Vitals:   11/29/21 0616 11/29/21 0822 11/29/21 0917 11/29/21 1254  BP: 92/69  (!) 118/104 112/82  Pulse: 98  (!) 103 93  Resp: 15   (!) 22  Temp: 98.3 F (36.8 C)   98.3 F (36.8 C)  TempSrc: Oral     SpO2: 100% 98%  95%  Weight:      Height:        Intake/Output Summary (Last 24 hours) at 11/29/2021 1823 Last data filed at 11/29/2021 1115 Gross per 24 hour   Intake 176.54 ml  Output 1150 ml  Net -973.46 ml   Filed Weights   11/09/21 2040 11/18/21 0430 11/28/21 1624  Weight: 67.9 kg 69.2 kg 65.5 kg   Examination: Physical Exam:  Constitutional: Thin chronically ill-appearing African-American male currently no acute distress appears little bit more comfortable but is hiccuping in the bed Respiratory: Diminished to auscultation bilaterally, no wheezing, rales, rhonchi or crackles. Normal respiratory effort and patient is not tachypenic. No accessory muscle use.  Unlabored breathing Cardiovascular: Slightly tachycardic, no murmurs / rubs / gallops. S1 and S2 auscultated.  Slight lower extremity edema on the right Abdomen: Soft, non-tender, non-distended.  Bowel sounds positive.  GU: Deferred. Musculoskeletal: Has right leg and arm contractures in the setting of his CVA Neurologic: CN 2-12 grossly intact with no focal deficits.  Romberg sign and cerebellar reflexes not assessed.  Psychiatric: Normal judgment and insight. Alert and oriented x 3. Normal mood and appropriate affect.   Data Reviewed: I have personally reviewed following labs and imaging studies  CBC: Recent Labs  Lab 11/25/21 0439 11/26/21 0530 11/27/21 0505 11/28/21 0344 11/29/21 0443  WBC 6.8 5.8 5.3 6.6 6.6  NEUTROABS 4.8 4.4 3.7 4.7 5.1  HGB 8.1* 8.0* 7.9* 7.9* 8.8*  HCT 27.5* 26.7* 26.6* 25.9* 28.7*  MCV 92.0 90.8 91.1 89.0 90.5  PLT 422* 466* 445* 474* 010*   Basic Metabolic Panel: Recent Labs  Lab 11/25/21 0439 11/26/21 0530 11/27/21 0505 11/28/21 0344 11/29/21 0443  NA 139 136 133* 136 135  K 4.4 4.3  4.4 4.3 4.3  CL 108 108 106 105 106  CO2 20* 19* 18* 19* 18*  GLUCOSE 88 95 98 94 90  BUN _0 CREATININE 0.96 0.88 0.89 0.86 0.98  CALCIUM 9.3 9.4 9.4 10.0 10.3  MG 1.9 2.1 2.0 2.1 2.0  PHOS 5.0* 4.4 3.9 4.6 3.9   GFR: Estimated Creatinine Clearance: 84.1 mL/min (by C-G formula based on SCr of 0.98 mg/dL). Liver Function Tests: Recent  Labs  Lab 11/25/21 0439 11/26/21 0530 11/27/21 0505 11/28/21 0344 11/29/21 0443  AST 45* 40 47* 52* 51*  ALT 41 37 39 43 43  ALKPHOS 311* 275* 262* 273* 276*  BILITOT 0.6 0.3 0.5 0.7 0.9  PROT 7.4 7.3 7.0 7.5 7.8  ALBUMIN 2.6* 2.6* 2.6* 2.6* 2.8*   No results for input(s): "LIPASE", "AMYLASE" in the last 168 hours. No results for input(s): "AMMONIA" in the last 168 hours. Coagulation Profile: No results for input(s): "INR", "PROTIME" in the last 168 hours. Cardiac Enzymes: No results for input(s): "CKTOTAL", "CKMB", "CKMBINDEX", "TROPONINI" in the last 168 hours. BNP (last 3 results) No results for input(s): "PROBNP" in the last 8760 hours. HbA1C: No results for input(s): "HGBA1C" in the last 72 hours. CBG: Recent Labs  Lab 11/23/21 0716 11/25/21 2105  GLUCAP 115* 106*   Lipid Profile: No results for input(s): "CHOL", "HDL", "LDLCALC", "TRIG", "CHOLHDL", "LDLDIRECT" in the last 72 hours. Thyroid Function Tests: No results for input(s): "TSH", "T4TOTAL", "FREET4", "T3FREE", "THYROIDAB" in the last 72 hours. Anemia Panel: No results for input(s): "VITAMINB12", "FOLATE", "FERRITIN", "TIBC", "IRON", "RETICCTPCT" in the last 72 hours. Sepsis Labs: No results for input(s): "PROCALCITON", "LATICACIDVEN" in the last 168 hours.  Recent Results (from the past 240 hour(s))  Culture, blood (Routine X 2) w Reflex to ID Panel     Status: None   Collection Time: 11/21/21  4:18 PM   Specimen: BLOOD  Result Value Ref Range Status   Specimen Description   Final    BLOOD BLOOD LEFT FOREARM Performed at [facility] [address] [place], [place] [address]    Special Requests   Final    BOTTLES DRAWN AEROBIC ONLY Blood Culture results may not be optimal due to an inadequate volume of blood received in culture bottles Performed at [facility] [address], [place], [place] [address]    Culture   Final    NO GROWTH 5 DAYS Performed at  [facility], [place] [address] [place], [place] [address]    Report Status 11/26/2021 FINAL  Final  Culture, blood (Routine X 2) w Reflex to ID Panel     Status: None   Collection Time: 11/21/21  4:18 PM   Specimen: BLOOD  Result Value Ref Range Status   Specimen Description   Final    BLOOD BLOOD LEFT WRIST Performed at [facility] [address] [place], [place] [address]    Special Requests   Final    IN PEDIATRIC BOTTLE Blood Culture adequate volume Performed at [facility] [address] [place], [place] [address]    Culture   Final    NO GROWTH 5 DAYS Performed at [facility], [place] [address] [place], [place] [address]    Report Status 11/26/2021 FINAL  Final     Radiology Studies: No results found.   Scheduled Meds:  abacavir-dolutegravir-lamiVUDine  1 tablet Oral Daily   azithromycin  500 mg Oral  Daily   B-complex with vitamin C  1 tablet Oral Daily   dronabinol  5 mg Oral BID AC   ethambutol  800 mg Oral Daily   feeding supplement  237 mL Oral TID BM   fostemsavir tromethamine  600 mg Oral BID   ketoconazole   Topical BID   metoprolol succinate  100 mg Oral q morning   mirtazapine  15 mg Oral QHS   mometasone-formoterol  2 puff Inhalation BID   nystatin  5 mL Mouth/Throat QID   pantoprazole  40 mg Oral QAC breakfast   QUEtiapine  200 mg Oral QHS   rifabutin  300 mg Oral Daily   rivaroxaban  20 mg Oral Q supper   rosuvastatin  5 mg Oral q morning   sodium chloride flush  3 mL Intravenous Q12H   sulfamethoxazole-trimethoprim  1 tablet Oral Daily   umeclidinium bromide  1 puff Inhalation Daily   Continuous Infusions:  [START ON 11/30/2021] micafungin (MYCAMINE) 150 mg in sodium chloride 0.9 % 100 mL IVPB       LOS: 19 days   Raiford Noble, DO [facility] Available via Epic secure chat 7am-7pm After these hours, please refer to coverage provider listed on [email or web address] 11/29/2021, 6:23 PM

## 2021-11-30 DIAGNOSIS — Z8673 Personal history of transient ischemic attack (TIA), and cerebral infarction without residual deficits: Secondary | ICD-10-CM | POA: Diagnosis not present

## 2021-11-30 DIAGNOSIS — R7989 Other specified abnormal findings of blood chemistry: Secondary | ICD-10-CM | POA: Diagnosis not present

## 2021-11-30 DIAGNOSIS — R6510 Systemic inflammatory response syndrome (SIRS) of non-infectious origin without acute organ dysfunction: Secondary | ICD-10-CM | POA: Diagnosis not present

## 2021-11-30 DIAGNOSIS — R531 Weakness: Secondary | ICD-10-CM | POA: Diagnosis not present

## 2021-11-30 LAB — COMPREHENSIVE METABOLIC PANEL
ALT: 13 U/L (ref 0–44)
AST: 29 U/L (ref 15–41)
Albumin: 2.4 g/dL — ABNORMAL LOW (ref 3.5–5.0)
Alkaline Phosphatase: 286 U/L — ABNORMAL HIGH (ref 38–126)
Anion gap: 7 (ref 5–15)
BUN: 23 mg/dL — ABNORMAL HIGH (ref 6–20)
CO2: 25 mmol/L (ref 22–32)
Calcium: 11.1 mg/dL — ABNORMAL HIGH (ref 8.9–10.3)
Chloride: 104 mmol/L (ref 98–111)
Creatinine, Ser: 1.5 mg/dL — ABNORMAL HIGH (ref 0.61–1.24)
GFR, Estimated: 57 mL/min — ABNORMAL LOW (ref >60)
Glucose, Bld: 92 mg/dL (ref 70–99)
Potassium: 4.9 mmol/L (ref 3.5–5.1)
Sodium: 136 mmol/L (ref 135–145)
Total Bilirubin: 0.6 mg/dL (ref 0.3–1.2)
Total Protein: 6.9 g/dL (ref 6.5–8.1)

## 2021-11-30 LAB — CBC WITH DIFFERENTIAL/PLATELET
Abs Immature Granulocytes: 0.06 10*3/uL (ref 0.00–0.07)
Basophils Absolute: 0.1 10*3/uL (ref 0.0–0.1)
Basophils Relative: 1 %
Eosinophils Absolute: 0.3 10*3/uL (ref 0.0–0.5)
Eosinophils Relative: 2 %
HCT: 26.8 % — ABNORMAL LOW (ref 39.0–52.0)
Hemoglobin: 8.3 g/dL — ABNORMAL LOW (ref 13.0–17.0)
Immature Granulocytes: 0 %
Lymphocytes Relative: 5 %
Lymphs Abs: 0.8 10*3/uL (ref 0.7–4.0)
MCH: 28.6 pg (ref 26.0–34.0)
MCHC: 31 g/dL (ref 30.0–36.0)
MCV: 92.4 fL (ref 80.0–100.0)
Monocytes Absolute: 1.8 10*3/uL — ABNORMAL HIGH (ref 0.1–1.0)
Monocytes Relative: 12 %
Neutro Abs: 12.1 10*3/uL — ABNORMAL HIGH (ref 1.7–7.7)
Neutrophils Relative %: 80 %
Platelets: 453 10*3/uL — ABNORMAL HIGH (ref 150–400)
RBC: 2.9 MIL/uL — ABNORMAL LOW (ref 4.22–5.81)
RDW: 15.9 % — ABNORMAL HIGH (ref 11.5–15.5)
WBC: 15 10*3/uL — ABNORMAL HIGH (ref 4.0–10.5)
nRBC: 0 % (ref 0.0–0.2)

## 2021-11-30 LAB — PHOSPHORUS: Phosphorus: 3.4 mg/dL (ref 2.5–4.6)

## 2021-11-30 LAB — MAGNESIUM: Magnesium: 2.1 mg/dL (ref 1.7–2.4)

## 2021-11-30 MED ORDER — SODIUM CHLORIDE 0.9 % IV SOLN: INTRAVENOUS | Status: DC

## 2021-11-30 MED ORDER — ENOXAPARIN SODIUM 40 MG/0.4ML IJ SOSY
40.0000 mg | PREFILLED_SYRINGE | INTRAMUSCULAR | Status: DC
Start: 2021-11-30 — End: 2021-11-30

## 2021-11-30 MED ORDER — METOCLOPRAMIDE HCL 5 MG/ML IJ SOLN
5.0000 mg | Freq: Four times a day (QID) | INTRAMUSCULAR | Status: DC
Start: 2021-11-30 — End: 2021-12-07
  Administered 2021-11-30 – 2021-12-07 (×26): 5 mg via INTRAVENOUS
  Filled 2021-11-30 (×27): qty 2

## 2021-11-30 MED ORDER — ENSURE ENLIVE PO LIQD
237.0000 mL | Freq: Four times a day (QID) | ORAL | Status: DC
  Administered 2021-11-30 – 2021-12-07 (×15): 237 mL via ORAL

## 2021-11-30 MED ORDER — DRONABINOL 2.5 MG PO CAPS
5.0000 mg | ORAL_CAPSULE | Freq: Three times a day (TID) | ORAL | Status: DC
  Administered 2021-11-30 – 2021-12-07 (×21): 5 mg via ORAL
  Filled 2021-11-30 (×21): qty 2

## 2021-11-30 NOTE — Progress Notes (Signed)
PROGRESS NOTE    Kurt Foley  UYQ:[id number] DOB: Sep 09, 1975 DOA: 11/09/2021 PCP: Gildardo Pounds, NP   Brief Narrative:  46 year old with advanced AIDS, disseminated MAC substance abuse, depression, prior CVA with residual right-sided weakness, pulmonary embolism on Xarelto admitted for cough and palpitations.  Repeat CT showed abnormal lung parenchyma.  He was seen by pulmonary, cultures remain negative.  Underwent bronchoscopy with EBUS on 6/5 with subcarinal lymph node sampling and brushing with BAL of right upper lobe mass.  Brushings remain negative, IR consulted for CT-guided biopsy.  Patient off-and-on spiking fever, discussed with ID and this is likely due to MAC.  Patient has also been seen by palliative care service, wishes to be full code. Unforturnately continues to spike fevers daily leading to tachycardia.    ID is now changing antibiotics and trying IV Zosyn.  Continues to have temperatures and tachycardia along with some back pain but fever seems improved this morning and ID recommends continuing current antibiotics and regimen at this time.  Palliative care is increasing his blood but now and PT OT recommending outpatient PT.   Given his recurrent fevers ID is now checking a CT of the chest abdomen pelvis to see if there is anything new that they could consider; otherwise that they are going to continue his current plan however they have changed his HIV medication from [name] to [name].  He has not changed his morphine p.o. to oxycodone.  Given his poor appetite we will start him on D5W half-normal saline at 75 MLS per hour and also given 500 mill bolus given his slight hypotension.   CT Scan of the Chest/Abd/Pelvis done and showed "Worsening nodal disease since recent imaging from May in April in the chest and abdomen though improved when compared to January. Findings may reflect either sequela of age related infection or could potentially be related to  neoplasm/lymphoproliferative disorder. Would suggest correlation with PET imaging to select a site for sampling. This could also be utilized to assess pulmonary findings that have raise concern on recent imaging. More  masslike appearance of RIGHT upper lobe changes and LEFT upper lobe nodules as discussed above, consideration for  pulmonary neoplasm as discussed on recent imaging. Signs of pulmonary emphysema. Three-vessel coronary artery disease.  Low-attenuation cardiac blood pool compatible with anemia.  Stranding about jejunal loops in the upper abdomen is of uncertain significance but is associated with increasing nodal disease, perhaps related to inflammation or lymphatic congestion. Attention on follow-up.  Bilateral perinephric stranding which was not evident on most recent abdominal imaging. Correlation with urinalysis is suggested. Aortic Atherosclerosis (ICD10-I70.0) and Emphysema."   ID recommends continuing current plan of care transitioning to oral Augmentin for 5 days at discharge.  ID feels that they do not expect any meaningful recovery but patient wishes to continue his current treatments and wants to consider physical rehabilitation.  We will consult PT OT for further evaluation recommendations and they are recommending outpatient PT and OT.  [name] infectious diseases reached out to me to see if we can get a GI consult for this patient given his nausea vomiting and difficulty with his food.  GI evaluated and feels that multiple medications can account for his nausea vomiting but he does not think this is a primary GI process and feels that the patient's main concern is singultus.  GI has recommended hospice but is declined.  GI feels there is no need for endoscopy currently but if there is concern for infectious esophagitis empiric  treatment such as fluconazole to be considered we will defer this to infectious diseases.  We will place the patient on scheduled antiemetics and [name] spoke  with [name].   Nutrition recommending changing Marinol to 8 AM and 3 PM daily for peak effectiveness.  Is unclear how much she is actually ingesting and taking in for his food and oral intake.  Calorie count has been initiated and ID has started the patient on micafungin given that he has reported dysphagia and cannot essentially rule out thrush; his CD4 count is improving.  Palliative is involved for further goals of care discussion     Assessment & Plan:   Principal Problem:   SIRS (systemic inflammatory response syndrome) (HCC) Active Problems:   Mass of upper lobe of right lung   Human immunodeficiency virus (HIV) disease (HCC)   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HCC)   Emphysema, unspecified ([place])   Abnormal LFTs   History of CVA with residual right hemiparesis   Pulmonary embolism (HCC)   Severe recurrent major depression without psychotic features ([place])   Tachycardia   Insomnia   Community acquired pneumonia of right lung   Mediastinal lymphadenopathy   Goals of care, counseling/discussion   Weakness   Encounter for palliative care   Oropharyngeal dysphagia  SIRS with recurrent fevers History of disseminated MAC infection - Seen by ID.  This is likely secondary to disseminated MAC history.  Current regimen-Biktarvy, azithromycin and ethambutol. Now on Rifabutin as well.  -ID feels that he has no real source of his fever but could be secondary to his disseminated MRSA infection -ID recommended GI evaluation given inability to keep food down and nausea and vomiting but GI evaluated and feels that he has no evidence of this and the patient's main issue is singultus.  [name] does not feel that his nausea vomiting is a primary GI process and recommending symptomatic treatment and scheduling antiemetics as well as possible treatment with fluconazole if concern for infectious esophagitis -GI signed off the case and has recommended hospice but patient has  declined; palliative continues to have goals of care discussion; ID started the patient on Mycobutin. D discontinued antibiotics and recommends continuing current plan of care as his fever has improved.    Sinus Tachycardia with fevers: Finally patient has remained afebrile now.  His tachycardia is also fairly controlled.  Continue Toprol-XL.  He has been started on IV fluids as well.  Right upper lobe mass Dyspnea with shortness of breath  -Status post bronchoscopy with EBUS on 6/5.  Brushings remain negative.  Case discussed with IR who recommends outptn PET scan. Spoke with Dr Lamonte Sakai from [facility], who also reviewed images and recommended outptn PET followed by follow up with Dr Valeta Harms for discussion of possible Navigation Bronch. There may be a component of infection which may clear with time?   AKI/metabolic acidosis: Both had resolved initially.  Now creatinine jumped back to 1.5 today.  Started on IV fluids.  History of PE and DVT: Continue Xarelto.  History of CVA -Residual right-sided weakness with right hand and arm contractures.  On statin.  PT OT on board.  Emphysema -Bronchodilators to be continued.  Substance use -Counseled to quit using.  Depression -Continue with on Seroquel and Remeron   Hyponatremia: Resolved.   Normocytic Anemia: Stable.  GERD -Daily PPI with Pantoprazole 40 mg po Daily   Elevated LFTs: Improved.  Nausea/poor p.o. intake: Apparently patient has poor p.o. intake which is being presumed due  to nausea which could in turn be due to presumed esophageal candidiasis.  He is getting antifungal treatment for that.  He is going to be started on scheduled Reglan by palliative care.  Nutrition on board as well.   Goals of care Severe protein calorie malnutrition -Advanced HIV with very guarded prognosis.  -Palliative care following-he wishes to be full code -Supplements.  Marinol added.  Appreciate palliative care help.    DVT prophylaxis: enoxaparin  (LOVENOX) injection 40 mg Start: 11/30/21 1445   Code Status: Full Code  Family Communication:  None present at bedside.  Plan of care discussed with patient in length and he/she verbalized understanding and agreed with it.  Status is: Inpatient Remains inpatient appropriate because: With poor p.o. intake and symptomatic with nausea.   Estimated body mass index is 23.31 kg/m as calculated from the following:   Height as of this encounter: _0  (1.676 m).   Weight as of this encounter: 65.5 kg.    Nutritional Assessment: Body mass index is 23.31 kg/m.Marland Kitchen Seen by dietician.  I agree with the assessment and plan as outlined below: Nutrition Status: Nutrition Problem: Increased nutrient needs Etiology: acute illness, chronic illness Signs/Symptoms: estimated needs Interventions: Refer to RD note for recommendations, Ensure Enlive (each supplement provides 350kcal and 20 grams of protein)  . Skin Assessment: I have examined the patient's skin and I agree with the wound assessment as performed by the wound care RN as outlined below:    Consultants:  ID Palliative care  Procedures:  none  Antimicrobials:  Anti-infectives (From admission, onward)    Start     Dose/Rate Route Frequency Ordered Stop   11/30/21 0000  micafungin (MYCAMINE) 150 mg in sodium chloride 0.9 % 100 mL IVPB  Status:  Discontinued        150 mg 107.5 mL/hr over 1 Hours Intravenous Every 24 hours 11/29/21 1340 11/30/21 0756   11/29/21 0000  micafungin (MYCAMINE) 50 mg in sodium chloride 0.9 % 100 mL IVPB  Status:  Discontinued        50 mg 102.5 mL/hr over 1 Hours Intravenous Every 24 hours 11/28/21 1939 11/29/21 1340   11/28/21 2200  fostemsavir tromethamine (RUKOBIA) ER tablet 600 mg        600 mg Oral 2 times daily 11/28/21 1944     11/24/21 1000  abacavir-dolutegravir-lamiVUDine (TRIUMEQ) 600-50-300 MG per tablet 1 tablet        1 tablet Oral Daily 11/23/21 1107     11/23/21 1130   piperacillin-tazobactam (ZOSYN) IVPB 3.375 g  Status:  Discontinued        3.375 g 12.5 mL/hr over 240 Minutes Intravenous Every 8 hours 11/23/21 1042 11/28/21 1937   11/20/21 1500  rifabutin (MYCOBUTIN) capsule 300 mg        300 mg Oral Daily 11/20/21 1341     11/20/21 1430  sulfamethoxazole-trimethoprim (BACTRIM DS) 800-160 MG per tablet 1 tablet        1 tablet Oral Daily 11/20/21 1342     11/17/21 0800  vancomycin (VANCOCIN) IVPB 1000 mg/200 mL premix  Status:  Discontinued        1,000 mg 200 mL/hr over 60 Minutes Intravenous Every 12 hours 11/16/21 1807 11/17/21 1048   11/16/21 1830  vancomycin (VANCOREADY) IVPB 1500 mg/300 mL        1,500 mg 150 mL/hr over 120 Minutes Intravenous STAT 11/16/21 1758 11/16/21 2048   11/16/21 1800  ceFEPIme (MAXIPIME) 2 g in sodium chloride 0.9 %  100 mL IVPB  Status:  Discontinued        2 g 200 mL/hr over 30 Minutes Intravenous Every 8 hours 11/16/21 1756 11/17/21 1048   11/11/21 2200  azithromycin (ZITHROMAX) tablet 500 mg        500 mg Oral Daily 11/11/21 1511     11/10/21 2200  azithromycin (ZITHROMAX) 500 mg in sodium chloride 0.9 % 250 mL IVPB  Status:  Discontinued        500 mg 250 mL/hr over 60 Minutes Intravenous Every 24 hours 11/10/21 1316 11/11/21 1511   11/10/21 1400  cefTRIAXone (ROCEPHIN) 2 g in sodium chloride 0.9 % 100 mL IVPB  Status:  Discontinued        2 g 200 mL/hr over 30 Minutes Intravenous Every 24 hours 11/10/21 1316 11/11/21 1511   11/10/21 1000  ethambutol (MYAMBUTOL) tablet 800 mg        800 mg Oral Daily 11/09/21 1840     11/10/21 1000  sulfamethoxazole-trimethoprim (BACTRIM DS) 800-160 MG per tablet 1 tablet  Status:  Discontinued        1 tablet Oral Daily 11/09/21 1840 11/10/21 1316   11/09/21 2200  azithromycin (ZITHROMAX) tablet 500 mg  Status:  Discontinued        500 mg Oral Daily at bedtime 11/09/21 1909 11/10/21 1316   11/09/21 1830  vancomycin (VANCOREADY) IVPB 1500 mg/300 mL        1,500 mg 150 mL/hr over  120 Minutes Intravenous STAT 11/09/21 1817 11/09/21 2157   11/09/21 1830  piperacillin-tazobactam (ZOSYN) IVPB 3.375 g        3.375 g 100 mL/hr over 30 Minutes Intravenous STAT 11/09/21 1817 11/09/21 1932   11/09/21 1700  bictegravir-emtricitabine-tenofovir AF (BIKTARVY) 50-200-25 MG per tablet 1 tablet  Status:  Discontinued        1 tablet Oral Daily 11/09/21 1608 11/23/21 1107         Subjective: Patient seen and examined.  He has no complaints.  Objective: Vitals:   11/30/21 0541 11/30/21 0747 11/30/21 0944 11/30/21 1238  BP: 109/84  107/75 102/80  Pulse: (!) 107  (!) 105 (!) 117  Resp: 15   16  Temp: 98.6 F (37 C)   97.6 F (36.4 C)  TempSrc: Oral   Oral  SpO2: 98% 95%  100%  Weight:      Height:        Intake/Output Summary (Last 24 hours) at 11/30/2021 1358 Last data filed at 11/30/2021 1030 Gross per 24 hour  Intake 444.8 ml  Output 300 ml  Net 144.8 ml   Filed Weights   11/09/21 2040 11/18/21 0430 11/28/21 1624  Weight: 67.9 kg 69.2 kg 65.5 kg    Examination:  General exam: Appears calm and comfortable , appears cachectic and chronically sick Respiratory system: Clear to auscultation. Respiratory effort normal. Cardiovascular system: S1 & S2 heard, RRR. No JVD, murmurs, rubs, gallops or clicks. No pedal edema. Gastrointestinal system: Abdomen is nondistended, soft and nontender. No organomegaly or masses felt. Normal bowel sounds heard. Central nervous system: Alert and oriented.  Contractures in the right lower extremity, right facial droop. Extremities: Symmetric 5 x 5 power. Skin: No rashes, lesions or ulcers  Data Reviewed: I have personally reviewed following labs and imaging studies  CBC: Recent Labs  Lab 11/26/21 0530 11/27/21 0505 11/28/21 0344 11/29/21 0443 11/30/21 0557  WBC 5.8 5.3 6.6 6.6 15.0*  NEUTROABS 4.4 3.7 4.7 5.1 12.1*  HGB 8.0* 7.9* 7.9* 8.8*  8.3*  HCT 26.7* 26.6* 25.9* 28.7* 26.8*  MCV 90.8 91.1 89.0 90.5 92.4  PLT 466*  445* 474* 497* 892*   Basic Metabolic Panel: Recent Labs  Lab 11/26/21 0530 11/27/21 0505 11/28/21 0344 11/29/21 0443 11/30/21 0557  NA 136 133* 136 135 136  K 4.3 4.4 4.3 4.3 4.9  CL 108 106 105 106 104  CO2 19* 18* 19* 18* 25  GLUCOSE 95 98 94 90 92  BUN _0 23*  CREATININE 0.88 0.89 0.86 0.98 1.50*  CALCIUM 9.4 9.4 10.0 10.3 11.1*  MG 2.1 2.0 2.1 2.0 2.1  PHOS 4.4 3.9 4.6 3.9 3.4   GFR: Estimated Creatinine Clearance: 54.9 mL/min (A) (by C-G formula based on SCr of 1.5 mg/dL (H)). Liver Function Tests: Recent Labs  Lab 11/26/21 0530 11/27/21 0505 11/28/21 0344 11/29/21 0443 11/30/21 0557  AST 40 47* 52* 51* 29  ALT 37 39 43 43 13  ALKPHOS 275* 262* 273* 276* 286*  BILITOT 0.3 0.5 0.7 0.9 0.6  PROT 7.3 7.0 7.5 7.8 6.9  ALBUMIN 2.6* 2.6* 2.6* 2.8* 2.4*   No results for input(s): "LIPASE", "AMYLASE" in the last 168 hours. No results for input(s): "AMMONIA" in the last 168 hours. Coagulation Profile: No results for input(s): "INR", "PROTIME" in the last 168 hours. Cardiac Enzymes: No results for input(s): "CKTOTAL", "CKMB", "CKMBINDEX", "TROPONINI" in the last 168 hours. BNP (last 3 results) No results for input(s): "PROBNP" in the last 8760 hours. HbA1C: No results for input(s): "HGBA1C" in the last 72 hours. CBG: Recent Labs  Lab 11/25/21 2105  GLUCAP 106*   Lipid Profile: No results for input(s): "CHOL", "HDL", "LDLCALC", "TRIG", "CHOLHDL", "LDLDIRECT" in the last 72 hours. Thyroid Function Tests: No results for input(s): "TSH", "T4TOTAL", "FREET4", "T3FREE", "THYROIDAB" in the last 72 hours. Anemia Panel: No results for input(s): "VITAMINB12", "FOLATE", "FERRITIN", "TIBC", "IRON", "RETICCTPCT" in the last 72 hours. Sepsis Labs: No results for input(s): "PROCALCITON", "LATICACIDVEN" in the last 168 hours.  Recent Results (from the past 240 hour(s))  Culture, blood (Routine X 2) w Reflex to ID Panel     Status: None   Collection Time: 11/21/21   4:18 PM   Specimen: BLOOD  Result Value Ref Range Status   Specimen Description   Final    BLOOD BLOOD LEFT FOREARM Performed at [facility] [address] [place], [place] [address]    Special Requests   Final    BOTTLES DRAWN AEROBIC ONLY Blood Culture results may not be optimal due to an inadequate volume of blood received in culture bottles Performed at [facility] [address] [place], [place] [address]    Culture   Final    NO GROWTH 5 DAYS Performed at [facility], [place] [address], [place], [place] [address]    Report Status 11/26/2021 FINAL  Final  Culture, blood (Routine X 2) w Reflex to ID Panel     Status: None   Collection Time: 11/21/21  4:18 PM   Specimen: BLOOD  Result Value Ref Range Status   Specimen Description   Final    BLOOD BLOOD LEFT WRIST Performed at [facility] [address], [place], [place] [address]    Special Requests   Final    IN PEDIATRIC BOTTLE Blood Culture adequate volume Performed at [facility] [address] [place], [place] [address]    Culture   Final    NO GROWTH  5 DAYS Performed at [facility], [place] [address] [place], [place] [address]    Report Status 11/26/2021 FINAL  Final     Radiology Studies: No results found.  Scheduled Meds:  abacavir-dolutegravir-lamiVUDine  1 tablet Oral Daily   azithromycin  500 mg Oral Daily   B-complex with vitamin C  1 tablet Oral Daily   dronabinol  5 mg Oral TID AC   enoxaparin (LOVENOX) injection  40 mg Subcutaneous Q24H   ethambutol  800 mg Oral Daily   feeding supplement  237 mL Oral QID   fostemsavir tromethamine  600 mg Oral BID   ketoconazole   Topical BID   metoCLOPramide (REGLAN) injection  5 mg Intravenous Q6H   metoprolol succinate  100 mg Oral q morning   mirtazapine  15 mg Oral QHS   mometasone-formoterol  2 puff Inhalation BID   nystatin  5 mL Mouth/Throat QID   pantoprazole   40 mg Oral QAC breakfast   QUEtiapine  200 mg Oral QHS   rifabutin  300 mg Oral Daily   rivaroxaban  20 mg Oral Q supper   rosuvastatin  5 mg Oral q morning   sodium chloride flush  3 mL Intravenous Q12H   sulfamethoxazole-trimethoprim  1 tablet Oral Daily   umeclidinium bromide  1 puff Inhalation Daily   Continuous Infusions:  sodium chloride 125 mL/hr at 11/30/21 1136     LOS: 20 days   Darliss Cheney, MD [facility]  11/30/2021, 1:58 PM   *Please note that this is a verbal dictation therefore any spelling or grammatical errors are due to the "[employer]" system interpretation.  Please page via [place] and do not message via secure chat for urgent patient care matters. Secure chat can be used for non urgent patient care matters.  How to contact the [facility] Attending or Consulting provider [place] or covering provider during after hours [place], for this patient?  Check the care team in [facility] and look for a) attending/consulting [facility] provider listed and b) the [facility] team listed. Page or secure chat [phone]. Log into [email or web address] and use [facility]'s universal password to access. If you do not have the password, please contact the hospital operator. Locate the [facility] provider you are looking for under [facility] and page to a number that you can be directly reached. If you still have difficulty reaching the provider, please page the [facility] (Director on Call) for the Hospitalists listed on amion for assistance.

## 2021-11-30 NOTE — Progress Notes (Signed)
Physical Therapy Treatment Patient Details Name: Kurt Foley MRN: [id number] DOB: [birth year] Today's Date: 11/30/2021   History of Present Illness 46 yo male admitted with SIRS. [name] hx of PE/DVT. Hx of CVA 12/22, 1/23 with R residual weakness, HIV/AIDS, R lung mass, depression, drug/ETOH use.Underwent bronchoscopy with EBUS on 6/5 with subcarinal lymph node sampling and brushing with BAL of right upper lobe mass.    PT Comments    AxO x 3 very pleasant.  Assisted OOB to amb a functional distance in hallway.  General Gait Details: Supervision for ambulation with Hemi walker  in hallway, 63f, recliner follow for safety and 50% VC's on proper Hemi walker placement "off to the side vs in front"   Recommendations for follow up therapy are one component of a multi-disciplinary discharge planning process, led by the attending physician.  Recommendations may be updated based on patient status, additional functional criteria and insurance authorization.  Follow Up Recommendations  Outpatient PT     Assistance Recommended at Discharge    Patient can return home with the following A little help with walking and/or transfers;Help with stairs or ramp for entrance;A little help with bathing/dressing/bathroom;Assistance with cooking/housework   Equipment Recommendations  None recommended by PT    Recommendations for Other Services       Precautions / Restrictions Precautions Precautions: Fall Precaution Comments: R residual weakness UE/LE; R foot AFO Restrictions Weight Bearing Restrictions: No     Mobility  Bed Mobility Overal bed mobility: Needs Assistance Bed Mobility: Supine to Sit     Supine to sit: Supervision     General bed mobility comments: Supervision for safety only, no physical assist required just required increased time and MinGuard to guide R LE off bed    Transfers Overall transfer level: Needs assistance Equipment used: Hemi-walker Transfers: Sit  to/from Stand Sit to Stand: Supervision           General transfer comment: Supervision for safety    Ambulation/Gait Ambulation/Gait assistance: Supervision, Min guard Gait Distance (Feet): 62 Feet Assistive device: Hemi-walker Gait Pattern/deviations: Decreased stride length, Step-to pattern, Decreased weight shift to right Gait velocity: decreased     General Gait Details: Supervision for ambulation with Hemi walker  in hallway, 676f recliner follow for safety and 50% VC's on proper Hemi walker placement "off to the side vs in front"   Stairs             Wheelchair Mobility    Modified Rankin (Stroke Patients Only)       Balance                                            Cognition Arousal/Alertness: Awake/alert Behavior During Therapy: WFL for tasks assessed/performed Overall Cognitive Status: Within Functional Limits for tasks assessed                                 General Comments: AxO x 3 very pleasant        Exercises      General Comments        Pertinent Vitals/Pain Pain Assessment Pain Assessment: No/denies pain    Home Living                          Prior Function  PT Goals (current goals can now be found in the care plan section) Progress towards PT goals: Progressing toward goals    Frequency    Min 3X/week      PT Plan Current plan remains appropriate    Co-evaluation              AM-PAC PT "6 Clicks" Mobility   Outcome Measure  Help needed turning from your back to your side while in a flat bed without using bedrails?: A Little Help needed moving from lying on your back to sitting on the side of a flat bed without using bedrails?: A Little Help needed moving to and from a bed to a chair (including a wheelchair)?: A Little Help needed standing up from a chair using your arms (e.g., wheelchair or bedside chair)?: A Little Help needed to walk in hospital  room?: A Little Help needed climbing 3-5 steps with a railing? : A Lot 6 Click Score: 17    End of Session Equipment Utilized During Treatment: Gait belt Activity Tolerance: Patient tolerated treatment well Patient left: in chair;with call bell/phone within reach;with chair alarm set Nurse Communication: Mobility status PT Visit Diagnosis: Difficulty in walking, not elsewhere classified (R26.2);Other symptoms and signs involving the nervous system (R29.898)     Time: 4132-4401 PT Time Calculation (min) (ACUTE ONLY): 19 min  Charges:  $Gait Training: 8-22 mins                     Rica Koyanagi  PTA [place] [facility] M-F          [phone] Weekend pager [phone]

## 2021-11-30 NOTE — Progress Notes (Signed)
NUTRITION NOTE  Follow-up for day #1 results of Calorie Count. Patient up walking with PT at the time of RD visit. He shares that he did not eat anything for breakfast this AM.   Secure chat discussion with Attending, Palliative Care MD, and ID MD; medications being adjusted for symptom management. Ongoing monitoring in the hope that these changes will aid in increased PO intake.  Able to talk with RN. She shares that patient refused all meals yesterday and breakfast today. Plan is to order lunch shortly and to have staff at bedside after meal delivery to encourage intake.  RN shares that when patient does not eat a meal she provides him with Ensure and also uses Ensure for medication administration for PO meds. RN states that if patient is handed the supplement he will only take a few sips but if ongoing encouragement is provided he will consume most of the bottle.  Yesterday he had 2 bottles of Ensure Plus High Protein and a bottle of Boost Plus; 100% completion of each of these would provide 1060 kcal and 56 grams protein.   RN shares that patient's mom visited on Monday and that his aunt visits daily. For dinner last night his aunt brought a baked potato for him; he only consumed 1 bite.  RD will follow-up 6/22 for day #2 results.   Estimated Nutritional Needs:  Kcal:  2050-2250 kcal Protein:  102-115 grams Fluid:  >/= 2.2 L/day     Kurt Gammon, MS, RD, LDN, CNSC Registered Dietitian II Inpatient Clinical Nutrition RD pager # and on-call/weekend pager # available in [facility]

## 2021-11-30 NOTE — Progress Notes (Signed)
Palliative Care Progress Note  Palliative Care Assessment & Plan    Patient Narrative: 46 year old gentleman with advanced AIDS, disseminated MAC, substance use, depression prior stroke with residual right-sided weakness, history of pulmonary embolism, admitted with cough and palpitations.  CT scan concerning for possible lung mass, underwent bronchoscopy, infectious disease following.  Has right upper lobe mass.  Bronchoscopy brushings negative, consideration was being given to pursuing biopsy however plan is to proceed with outpatient PET scan.  Hospital course complicated by SIRS, patient off-and-on spiking fevers.  Remains on azithromycin. Palliative medicine team following for CODE STATUS and broad goals of care discussions   Assessment: Goals continue for full code/full scope care. His pain is under good control. Normal BM. The main issue is poor PO intake, weight loss, no appetite. I questioned him in depth and it appears nausea is that main thing he is experiencing along with hiccups and reflux symptoms. Etiology is likely multifactorial- but notably his corrected calcium is 12.5-?from MAC infection or lung mass.   Recommendations/Plan: Nausea: Scheduled reglan, monitor closely for extrapyramidal side effects since he is on Seroqel, mirtazapine etc.. Hypercalcemia: IV hydration, address etiology of hypercalcemia Pain: Continue oxycodone 5 to 10 mg every 4 hours as needed for breakthrough pain.     Goals of Care and Additional Recommendations: Full scope, wants treatment and more time before considering hospice care. Would need to get his family involved for further conversation.   Code Status: Full code   Discharge Planning: To Be Determined- he has multiple family members helping him at home-Would recommend [place] was discussed with patient, ID, hospitalist and Nutrition    Thank you for allowing the Palliative Medicine Team to assist in the care  of this patient.   Lane Hacker, DO Palliative Medicine  Time: 60 minutes

## 2021-11-30 NOTE — Progress Notes (Signed)
IV infusing micafungin when pt called RN in room for pain and itching at IV site which was red and swollen. Infusion was stopped, IV removed, and on-call provider notified with no new orders.

## 2021-11-30 NOTE — Progress Notes (Signed)
Subjective:  No new complaints   Antibiotics:  Anti-infectives (From admission, onward)    Start     Dose/Rate Route Frequency Ordered Stop   11/30/21 0000  micafungin (MYCAMINE) 150 mg in sodium chloride 0.9 % 100 mL IVPB  Status:  Discontinued        150 mg 107.5 mL/hr over 1 Hours Intravenous Every 24 hours 11/29/21 1340 11/30/21 0756   11/29/21 0000  micafungin (MYCAMINE) 50 mg in sodium chloride 0.9 % 100 mL IVPB  Status:  Discontinued        50 mg 102.5 mL/hr over 1 Hours Intravenous Every 24 hours 11/28/21 1939 11/29/21 1340   11/28/21 2200  fostemsavir tromethamine (RUKOBIA) ER tablet 600 mg        600 mg Oral 2 times daily 11/28/21 1944     11/24/21 1000  abacavir-dolutegravir-lamiVUDine (TRIUMEQ) 600-50-300 MG per tablet 1 tablet        1 tablet Oral Daily 11/23/21 1107     11/23/21 1130  piperacillin-tazobactam (ZOSYN) IVPB 3.375 g  Status:  Discontinued        3.375 g 12.5 mL/hr over 240 Minutes Intravenous Every 8 hours 11/23/21 1042 11/28/21 1937   11/20/21 1500  rifabutin (MYCOBUTIN) capsule 300 mg        300 mg Oral Daily 11/20/21 1341     11/20/21 1430  sulfamethoxazole-trimethoprim (BACTRIM DS) 800-160 MG per tablet 1 tablet        1 tablet Oral Daily 11/20/21 1342     11/17/21 0800  vancomycin (VANCOCIN) IVPB 1000 mg/200 mL premix  Status:  Discontinued        1,000 mg 200 mL/hr over 60 Minutes Intravenous Every 12 hours 11/16/21 1807 11/17/21 1048   11/16/21 1830  vancomycin (VANCOREADY) IVPB 1500 mg/300 mL        1,500 mg 150 mL/hr over 120 Minutes Intravenous STAT 11/16/21 1758 11/16/21 2048   11/16/21 1800  ceFEPIme (MAXIPIME) 2 g in sodium chloride 0.9 % 100 mL IVPB  Status:  Discontinued        2 g 200 mL/hr over 30 Minutes Intravenous Every 8 hours 11/16/21 1756 11/17/21 1048   11/11/21 2200  azithromycin (ZITHROMAX) tablet 500 mg        500 mg Oral Daily 11/11/21 1511     11/10/21 2200  azithromycin (ZITHROMAX) 500 mg in sodium chloride  0.9 % 250 mL IVPB  Status:  Discontinued        500 mg 250 mL/hr over 60 Minutes Intravenous Every 24 hours 11/10/21 1316 11/11/21 1511   11/10/21 1400  cefTRIAXone (ROCEPHIN) 2 g in sodium chloride 0.9 % 100 mL IVPB  Status:  Discontinued        2 g 200 mL/hr over 30 Minutes Intravenous Every 24 hours 11/10/21 1316 11/11/21 1511   11/10/21 1000  ethambutol (MYAMBUTOL) tablet 800 mg        800 mg Oral Daily 11/09/21 1840     11/10/21 1000  sulfamethoxazole-trimethoprim (BACTRIM DS) 800-160 MG per tablet 1 tablet  Status:  Discontinued        1 tablet Oral Daily 11/09/21 1840 11/10/21 1316   11/09/21 2200  azithromycin (ZITHROMAX) tablet 500 mg  Status:  Discontinued        500 mg Oral Daily at bedtime 11/09/21 1909 11/10/21 1316   11/09/21 1830  vancomycin (VANCOREADY) IVPB 1500 mg/300 mL        1,500 mg 150 mL/hr  over 120 Minutes Intravenous STAT 11/09/21 1817 11/09/21 2157   11/09/21 1830  piperacillin-tazobactam (ZOSYN) IVPB 3.375 g        3.375 g 100 mL/hr over 30 Minutes Intravenous STAT 11/09/21 1817 11/09/21 1932   11/09/21 1700  bictegravir-emtricitabine-tenofovir AF (BIKTARVY) 50-200-25 MG per tablet 1 tablet  Status:  Discontinued        1 tablet Oral Daily 11/09/21 1608 11/23/21 1107       Medications: Scheduled Meds:  abacavir-dolutegravir-lamiVUDine  1 tablet Oral Daily   azithromycin  500 mg Oral Daily   B-complex with vitamin C  1 tablet Oral Daily   dronabinol  5 mg Oral BID AC   ethambutol  800 mg Oral Daily   feeding supplement  237 mL Oral TID BM   fostemsavir tromethamine  600 mg Oral BID   ketoconazole   Topical BID   metoprolol succinate  100 mg Oral q morning   mirtazapine  15 mg Oral QHS   mometasone-formoterol  2 puff Inhalation BID   nystatin  5 mL Mouth/Throat QID   pantoprazole  40 mg Oral QAC breakfast   QUEtiapine  200 mg Oral QHS   rifabutin  300 mg Oral Daily   rivaroxaban  20 mg Oral Q supper   rosuvastatin  5 mg Oral q morning   sodium  chloride flush  3 mL Intravenous Q12H   sulfamethoxazole-trimethoprim  1 tablet Oral Daily   umeclidinium bromide  1 puff Inhalation Daily   Continuous Infusions:   PRN Meds:.acetaminophen (TYLENOL) oral liquid 160 mg/5 mL, acetaminophen **OR** acetaminophen, baclofen, chlorpheniramine-HYDROcodone, guaiFENesin-dextromethorphan, hydrALAZINE, ibuprofen, ipratropium-albuterol, metoprolol tartrate, morphine injection, oxyCODONE, polyethylene glycol, senna-docusate    Objective: Weight change:   Intake/Output Summary (Last 24 hours) at 11/30/2021 1059 Last data filed at 11/30/2021 1030 Gross per 24 hour  Intake 444.8 ml  Output 400 ml  Net 44.8 ml    Blood pressure 107/75, pulse (!) 105, temperature 98.6 F (37 C), temperature source Oral, resp. rate 15, height 5\' 6"  (1.676 m), weight 65.5 kg, SpO2 95 %. Temp:  [98.3 F (36.8 C)-98.8 F (37.1 C)] 98.6 F (37 C) (06/21 0541) Pulse Rate:  [93-107] 105 (06/21 0944) Resp:  [15-22] 15 (06/21 0541) BP: (107-112)/(75-84) 107/75 (06/21 0944) SpO2:  [95 %-98 %] 95 % (06/21 0747)  Physical Exam: Physical Exam Constitutional:      Appearance: He is well-developed.  HENT:     Head: Normocephalic and atraumatic.  Eyes:     Conjunctiva/sclera: Conjunctivae normal.  Cardiovascular:     Rate and Rhythm: Normal rate and regular rhythm.  Pulmonary:     Effort: Pulmonary effort is normal. No respiratory distress.     Breath sounds: Normal breath sounds. No stridor. No wheezing.  Abdominal:     General: There is no distension.     Palpations: Abdomen is soft.  Musculoskeletal:        General: Normal range of motion.     Cervical back: Normal range of motion and neck supple.  Skin:    General: Skin is warm and dry.     Findings: No erythema or rash.  Neurological:     Mental Status: He is alert and oriented to person, place, and time.  Psychiatric:        Mood and Affect: Mood normal.        Speech: Speech is delayed.        Behavior:  Behavior normal. Behavior is cooperative.  Thought Content: Thought content normal.        Cognition and Memory: Cognition is impaired. He exhibits impaired remote memory.        Judgment: Judgment normal.      CBC:    BMET Recent Labs    11/29/21 0443 11/30/21 0557  NA 135 136  K 4.3 4.9  CL 106 104  CO2 18* 25  GLUCOSE 90 92  BUN 11 23*  CREATININE 0.98 1.50*  CALCIUM 10.3 11.1*      Liver Panel  Recent Labs    11/29/21 0443 11/30/21 0557  PROT 7.8 6.9  ALBUMIN 2.8* 2.4*  AST 51* 29  ALT 43 13  ALKPHOS 276* 286*  BILITOT 0.9 0.6        Sedimentation Rate No results for input(s): "ESRSEDRATE" in the last 72 hours. C-Reactive Protein No results for input(s): "CRP" in the last 72 hours.  Micro Results: Recent Results (from the past 720 hour(s))  Urine Culture     Status: Abnormal   Collection Time: 11/09/21  3:01 PM   Specimen: Urine, Clean Catch  Result Value Ref Range Status   Specimen Description   Final    URINE, CLEAN CATCH Performed at [facility], [address] [place], [place] [address]    Special Requests   Final    NONE Performed at [facility], [address] [place], [place] [address]    Culture (A)  Final    <10,000 COLONIES/mL INSIGNIFICANT GROWTH Performed at [facility], [address], [place], [place] [address]    Report Status 11/10/2021 FINAL  Final  SARS Coronavirus 2 by RT PCR (hospital order, performed in [facility] hospital lab) *cepheid single result test* Anterior Nasal Swab     Status: None   Collection Time: 11/09/21  3:01 PM   Specimen: Anterior Nasal Swab  Result Value Ref Range Status   SARS Coronavirus 2 by RT PCR NEGATIVE NEGATIVE Final    Comment: (NOTE) SARS-CoV-2 target nucleic acids are NOT DETECTED.  The SARS-CoV-2 RNA is generally detectable in upper and lower respiratory specimens during the acute phase of infection. The lowest concentration  of SARS-CoV-2 viral copies this assay can detect is 250 copies / mL. A negative result does not preclude SARS-CoV-2 infection and should not be used as the sole basis for treatment or other patient management decisions.  A negative result may occur with improper specimen collection / handling, submission of specimen other than nasopharyngeal swab, presence of viral mutation(s) within the areas targeted by this assay, and inadequate number of viral copies (<250 copies / mL). A negative result must be combined with clinical observations, patient history, and epidemiological information.  Fact Sheet for Patients:   RoadLapTop.co.za  Fact Sheet for Healthcare Providers: http://kim-miller.com/  This test is not yet approved or  cleared by the [place] FDA and has been authorized for detection and/or diagnosis of SARS-CoV-2 by FDA under an Emergency Use Authorization (EUA).  This EUA will remain in effect (meaning this test can be used) for the duration of the COVID-19 declaration under Section 564(b)(1) of the Act, 21 U.S.C. section 360bbb-3(b)(1), unless the authorization is terminated or revoked sooner.  Performed at [facility], [address], [place], [place] [address]   Blood Culture (routine x 2)     Status: None   Collection Time: 11/09/21  3:38 PM   Specimen: BLOOD  Result Value Ref Range Status  Specimen Description   Final    BLOOD RIGHT ANTECUBITAL Performed at [facility], [address] [place], [place] [address]    Special Requests   Final    BOTTLES DRAWN AEROBIC AND ANAEROBIC Blood Culture results may not be optimal due to an inadequate volume of blood received in culture bottles Performed at [facility], [address] [place], [place] [address]    Culture   Final    NO GROWTH 5 DAYS Performed at [facility], [address] [place], [place]  [address]    Report Status 11/14/2021 FINAL  Final  Blood Culture (routine x 2)     Status: None   Collection Time: 11/09/21  5:02 PM   Specimen: BLOOD  Result Value Ref Range Status   Specimen Description   Final    BLOOD SITE NOT SPECIFIED Performed at [facility], [address], [place], [place] [address]    Special Requests   Final    BOTTLES DRAWN AEROBIC AND ANAEROBIC Blood Culture results may not be optimal due to an inadequate volume of blood received in culture bottles Performed at [facility], [address], [place], [place] [address]    Culture   Final    NO GROWTH 5 DAYS Performed at [facility], [address], [place], [place] [address]    Report Status 11/14/2021 FINAL  Final  Acid Fast Smear (AFB)     Status: None   Collection Time: 11/12/21  1:12 AM   Specimen: Vein; Blood  Result Value Ref Range Status   AFB Specimen Processing Concentration  Final    Comment: (NOTE) Performed At: [facility] [address] [place], [place] [id number] Jolene Schimke MD [id number]    Acid Fast Smear QNSAFB  Final    Comment: (NOTE) Test not performed. AFB Smear not performed due to specimen source (blood) or insufficient specimen.    Source (AFB) BLD  Final    Comment: Performed at [facility], [address], [place], [place] [address]  Fungus Culture With Stain     Status: None (Preliminary result)   Collection Time: 11/14/21  2:42 PM   Specimen: Bronchial Washing, Right; Respiratory  Result Value Ref Range Status   Fungus Stain Final report  Final    Comment: (NOTE) Performed At: [facility] [address] [place], [place] [id number] Jolene Schimke MD [id number]    Fungus (Mycology) Culture PENDING  Incomplete   Fungal Source BRONCHIAL ALVEOLAR LAVAGE  Final    Comment: Performed at [facility], [address], [place], [place] [address]  Culture,  Respiratory w Gram Stain     Status: None   Collection Time: 11/14/21  2:42 PM   Specimen: Bronchial Washing, Right; Respiratory  Result Value Ref Range Status   Specimen Description   Final    BRONCHIAL ALVEOLAR LAVAGE RUL Performed at [facility], [address] [place], [place] [address]    Special Requests   Final    NONE Performed at [facility], [address], [place], [place] [address]    Gram Stain NO WBC SEEN NO ORGANISMS SEEN   Final   Culture   Final    NO GROWTH 2 DAYS Performed at [facility], [address], [place], [place] [address]    Report Status 11/17/2021 FINAL  Final  Acid Fast Smear (AFB)  Status: None   Collection Time: 11/14/21  2:42 PM   Specimen: Bronchial Washing, Right; Respiratory  Result Value Ref Range Status   AFB Specimen Processing Concentration  Final   Acid Fast Smear Negative  Final    Comment: (NOTE) Performed At: [facility] [address] [place], [place] [id number] Jolene SchimkeNagendra Sanjai MD WU:[phone]    Source (AFB) BRONCHIAL ALVEOLAR LAVAGE  Final    Comment: Performed at [facility], [address] [place], [place] [address]  Anaerobic culture w Gram Stain     Status: None   Collection Time: 11/14/21  2:42 PM   Specimen: Bronchial Washing, Right; Respiratory  Result Value Ref Range Status   Specimen Description   Final    BRONCHIAL ALVEOLAR LAVAGE RUL Performed at [facility], [address] [place], [place] [address]    Special Requests   Final    NONE Performed at [facility], [address] [place], [place] [address]    Culture   Final    NO ANAEROBES ISOLATED Performed at [facility], [address] [place], [place] [address]    Report Status 11/20/2021 FINAL  Final  Fungus Culture Result     Status: None   Collection Time: 11/14/21  2:42 PM  Result Value Ref Range Status   Result 1 Comment   Final    Comment: (NOTE) KOH/Calcofluor preparation:  no fungus observed. Performed At: [facility] [address] [place], [place] [id number] Jolene SchimkeNagendra Sanjai MD UU:[phone]   Culture, blood (Routine X 2) w Reflex to ID Panel     Status: None   Collection Time: 11/16/21  7:00 PM   Specimen: BLOOD LEFT FOREARM  Result Value Ref Range Status   Specimen Description BLOOD LEFT FOREARM  Final   Special Requests IN PEDIATRIC BOTTLE Blood Culture adequate volume  Final   Culture   Final    NO GROWTH 5 DAYS Performed at [facility], [address] [place], [place] [address]    Report Status 11/21/2021 FINAL  Final  Culture, blood (Routine X 2) w Reflex to ID Panel     Status: None   Collection Time: 11/16/21  7:00 PM   Specimen: BLOOD LEFT WRIST  Result Value Ref Range Status   Specimen Description   Final    BLOOD LEFT WRIST BLOOD Performed at [facility], [address] [place], [place] [address]    Special Requests IN PEDIATRIC BOTTLE Blood Culture adequate volume  Final   Culture   Final    NO GROWTH 5 DAYS Performed at [facility], [address] [place], [place] [address]    Report Status 11/21/2021 FINAL  Final  Acid Fast Smear (AFB)     Status: None   Collection Time: 11/19/21 11:44 AM   Specimen: Vein; Blood  Result Value Ref Range Status   AFB Specimen Processing Concentration  Final    Comment: (NOTE) Performed At: [facility] [address] [place], [place] [id number] Jolene SchimkeNagendra Sanjai MD YS:[phone]    Acid Fast Smear QNSAFB  Final    Comment: (NOTE) Test not performed. AFB Smear not performed due to specimen source (blood) or insufficient specimen.    Source (AFB) BLOOD  Final    Comment: Performed at [facility], [address] [place], [place] [address]  Culture, blood (Routine X 2) w Reflex to ID Panel     Status: None  Collection Time: 11/21/21  4:18 PM   Specimen: BLOOD  Result  Value Ref Range Status   Specimen Description   Final    BLOOD BLOOD LEFT FOREARM Performed at [facility], [address], [place], [place] [address]    Special Requests   Final    BOTTLES DRAWN AEROBIC ONLY Blood Culture results may not be optimal due to an inadequate volume of blood received in culture bottles Performed at [facility], [address], [place], [place] [address]    Culture   Final    NO GROWTH 5 DAYS Performed at [facility], [address] [place], [place] [address]    Report Status 11/26/2021 FINAL  Final  Culture, blood (Routine X 2) w Reflex to ID Panel     Status: None   Collection Time: 11/21/21  4:18 PM   Specimen: BLOOD  Result Value Ref Range Status   Specimen Description   Final    BLOOD BLOOD LEFT WRIST Performed at [facility], [address], [place], [place] [address]    Special Requests   Final    IN PEDIATRIC BOTTLE Blood Culture adequate volume Performed at [facility], [address], [place], [place] [address]    Culture   Final    NO GROWTH 5 DAYS Performed at [facility], [address] [place], [place] [address]    Report Status 11/26/2021 FINAL  Final    Studies/Results: No results found.    Assessment/Plan:  INTERVAL HISTORY:   Patient had pain and itching at the site of micafungin infusion infusion was stopped and his IV was removed overnight.    Principal Problem:   SIRS (systemic inflammatory response syndrome) (HCC) Active Problems:   Human immunodeficiency virus (HIV) disease (HCC)   Severe recurrent major depression without psychotic features (HCC)   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HCC)   Emphysema, unspecified (HCC)   History of CVA with residual right hemiparesis   Tachycardia   Insomnia   Abnormal LFTs   Mass of upper lobe of right lung   Pulmonary embolism (HCC)   Community acquired pneumonia  of right lung   Mediastinal lymphadenopathy   Goals of care, counseling/discussion   Weakness   Encounter for palliative care   Oropharyngeal dysphagia    Kurt Foley is a 46 y.o. 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  It improved with empiric Zosyn despite a clear obvious target for the antibiotic.  I stopped his antibacterial antibiotics and he has been afebrile off antibiotics  #2 reported dysphagia: See past notes not entirely clear if he really has dysphagia not due to his potential reaction to micafungin and problems trying to give him fluconazole with his current regimen we will continue with nystatin swish and swallow alone for now  Would recommend observing what he actually eats calorie counts etc.   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  #5 PML with IRIS : This was diagnosed in January of 2023 while he was successfully virologically controlled and I suspect IRIS was a good portion of this pathology  #6  Leukocytosis and acute creatinine elevation: Suspect he may be dehydrated from poor oral intake would follow-up labs in the morning  Goals of care: He continues to express desire for aggressive care when I have spoken with him     LOS: 20 days   Acey Lav 11/30/2021, 10:59 AM

## 2021-12-01 ENCOUNTER — Inpatient Hospital Stay (HOSPITAL_COMMUNITY): Payer: Medicaid Other

## 2021-12-01 DIAGNOSIS — R7989 Other specified abnormal findings of blood chemistry: Secondary | ICD-10-CM | POA: Diagnosis not present

## 2021-12-01 DIAGNOSIS — R6510 Systemic inflammatory response syndrome (SIRS) of non-infectious origin without acute organ dysfunction: Secondary | ICD-10-CM | POA: Diagnosis not present

## 2021-12-01 DIAGNOSIS — N179 Acute kidney failure, unspecified: Secondary | ICD-10-CM | POA: Diagnosis not present

## 2021-12-01 DIAGNOSIS — J189 Pneumonia, unspecified organism: Secondary | ICD-10-CM

## 2021-12-01 DIAGNOSIS — B20 Human immunodeficiency virus [HIV] disease: Secondary | ICD-10-CM | POA: Diagnosis not present

## 2021-12-01 DIAGNOSIS — E43 Unspecified severe protein-calorie malnutrition: Secondary | ICD-10-CM | POA: Insufficient documentation

## 2021-12-01 LAB — COMPREHENSIVE METABOLIC PANEL
ALT: 50 U/L — ABNORMAL HIGH (ref 0–44)
AST: 66 U/L — ABNORMAL HIGH (ref 15–41)
Albumin: 2.8 g/dL — ABNORMAL LOW (ref 3.5–5.0)
Alkaline Phosphatase: 330 U/L — ABNORMAL HIGH (ref 38–126)
Anion gap: 10 (ref 5–15)
BUN: 11 mg/dL (ref 6–20)
CO2: 19 mmol/L — ABNORMAL LOW (ref 22–32)
Calcium: 10.2 mg/dL (ref 8.9–10.3)
Chloride: 107 mmol/L (ref 98–111)
Creatinine, Ser: 0.96 mg/dL (ref 0.61–1.24)
GFR, Estimated: >60 mL/min (ref >60)
Glucose, Bld: 94 mg/dL (ref 70–99)
Potassium: 4.9 mmol/L (ref 3.5–5.1)
Sodium: 136 mmol/L (ref 135–145)
Total Bilirubin: 0.6 mg/dL (ref 0.3–1.2)
Total Protein: 8 g/dL (ref 6.5–8.1)

## 2021-12-01 LAB — CRYPTOCOCCAL ANTIGEN: Crypto Ag: NEGATIVE

## 2021-12-01 MED ORDER — IOHEXOL 9 MG/ML PO SOLN
500.0000 mL | ORAL | Status: AC
  Administered 2021-11-30: 500 mL via ORAL

## 2021-12-01 MED ORDER — IOHEXOL 300 MG/ML  SOLN
100.0000 mL | Freq: Once | INTRAMUSCULAR | Status: AC | PRN
Start: 2021-12-01 — End: 2021-12-01
  Administered 2021-12-01: 100 mL via INTRAVENOUS

## 2021-12-01 MED ORDER — IOHEXOL 9 MG/ML PO SOLN
ORAL | Status: AC
  Administered 2021-12-01: 500 mL via ORAL
  Filled 2021-12-01: qty 1000

## 2021-12-01 NOTE — Progress Notes (Signed)
   11/30/21 2127  Assess: MEWS Score  Temp (!) 102.1 F (38.9 C)  BP 97/65  MAP (mmHg) 75  Pulse Rate (!) 105  Resp (!) 40  SpO2 100 %  O2 Device Room Air  Assess: MEWS Score  MEWS Temp 2  MEWS Systolic 1  MEWS Pulse 1  MEWS RR 3  MEWS LOC 0  MEWS Score 7  MEWS Score Color Red  Assess: SIRS CRITERIA  SIRS Temperature  1  SIRS Pulse 1  SIRS Respirations  1  SIRS WBC 0  SIRS Score Sum  3   PRN Tylenol administered, ice packs applied to underarms, room temperature lowered. Charge nurse, on-call, and RR nurse notified. No new orders. Temp recheck was 98.5 F.  During bedside report, pt somewhat interactive with no complaints. During shift assessment, pt had tachypnea, was warm to touch, and irritable. RN removed IV which was infiltrated. Pt refused a new IV site and refused when RN asked again at 0100. RN educated pt that IV was needed for IV fluids and Reglan. Pt said to ask again at a later time. On-call notified and no new orders given.

## 2021-12-01 NOTE — Progress Notes (Signed)
Nutrition Follow-up  DOCUMENTATION CODES:   Severe malnutrition in context of acute illness/injury  INTERVENTION:  - continue Ensure QID.  - recommend PEG/G-tube and initiation of tube feeding as patient meeting <50% estimated nutrition needs for >/= 5 days and now meets criteria for severe malnutrition.    NUTRITION DIAGNOSIS:   Severe Malnutrition related to acute illness as evidenced by energy intake < or equal to 50% for > or equal to 5 days, percent weight loss. -revised  GOAL:   Patient will meet greater than or equal to 90% of their needs -unmet  MONITOR:   PO intake, Supplement acceptance, Labs, Weight trends  ASSESSMENT:   46 y.o. male with medical history of substance use, RUL mass pending bronch by Pulmonology to evaluate, HIV/AIDS, PML, MAI, emphysema, CVA with residual R-sided hemiparesis, and depression. He was admitted 5/20-5/24 due to shortness of breath and at that time was noted at that time to have DVT and PE with R-sided heart strain. He returned to the ED due to ongoing shortness of breath, weakness,  cough which is sometimes painful, sensation of his heart racing, and decreased urine output with concern for dehydration. Patient was admitted for SIRS.  Calorie Count day #2 results. No meal tickets in envelope on patient's door. Flow sheet documentation indicates he ate 0% at all meals yesterday.  Patient sleeping at the time of visit with no visitors present at that time. An opened bottle of Ensure that feels full on bedside table.   Patient easily awakes to name call x2. He reports that he had no visitors yesterday or so far today. He states that he ate a few bites of collards and chicken yesterday but unable to provide further detail on this.  Patient in and out of sleep during RD visit, often mumbling, and at times reporting "I don't know" to questions such as asking if he is having oral or esophageal (mouth or throat) pain, abdominal (stomach) pain, or  nausea.    He states that he has not had breakfast today. He is receptive to chicken salad sandwich for lunch.  Patient has lost 6 lb (4% body weight) in the 3 weeks since admission.   He remains Full Code.   Labs reviewed; Alk Phos elevated, LFT elevated.  Medications reviewed; 1 tablet vitamin B complex with vitamin C/day,5 mg IV reglan QID, 15 mg oral remeron/day, 5 ml mycostatin QID, 40 mg oral protonix/day.  IVF; NS @ 125 ml/hr.    Diet Order:   Diet Order             Diet regular Room service appropriate? Yes; Fluid consistency: Thin  Diet effective now                   EDUCATION NEEDS:   No education needs have been identified at this time  Skin:  Skin Assessment: Reviewed RN Assessment  Last BM:  6/22 (type 7 x1)  Height:   Ht Readings from Last 1 Encounters:  11/09/21 _0  (1.676 m)    Weight:   Wt Readings from Last 1 Encounters:  11/28/21 65.5 kg     BMI:  Body mass index is 23.31 kg/m.  Estimated Nutritional Needs:  Kcal:  2050-2250 kcal Protein:  102-115 grams Fluid:  >/= 2.2 L/day     Jarome Matin, MS, RD, LDN, CNSC Registered Dietitian II Inpatient Clinical Nutrition RD pager # and on-call/weekend pager # available in [facility]

## 2021-12-01 NOTE — Progress Notes (Signed)
PROGRESS NOTE    Kurt Foley  ZOX:[id number] DOB: 1975-06-23 DOA: 11/09/2021 PCP: Kurt Pounds, NP   Brief Narrative:  46 year old with advanced AIDS, disseminated MAC substance abuse, depression, prior CVA with residual right-sided weakness, pulmonary embolism on Xarelto admitted for cough and palpitations.  Repeat CT showed abnormal lung parenchyma.  He was seen by pulmonary, cultures remain negative.  Underwent bronchoscopy with EBUS on 6/5 with subcarinal lymph node sampling and brushing with BAL of right upper lobe mass.  Brushings remain negative, IR consulted for CT-guided biopsy.  Patient off-and-on spiking fever, discussed with ID and this is likely due to MAC.  Patient has also been seen by palliative care service, wishes to be full code. Unforturnately continues to spike fevers daily leading to tachycardia.    ID is now changing antibiotics and trying IV Zosyn.  Continues to have temperatures and tachycardia along with some back pain but fever seems improved this morning and ID recommends continuing current antibiotics and regimen at this time.  Palliative care is increasing his blood but now and PT OT recommending outpatient PT.   Given his recurrent fevers ID is now checking a CT of the chest abdomen pelvis to see if there is anything new that they could consider; otherwise that they are going to continue his current plan however they have changed his HIV medication from Kurt Foley to Kurt Foley.  He has not changed his morphine p.o. to oxycodone.  Given his poor appetite we will start him on D5W half-normal saline at 75 MLS per hour and also given 500 mill bolus given his slight hypotension.   CT Scan of the Chest/Abd/Pelvis done and showed "Worsening nodal disease since recent imaging from May in April in the chest and abdomen though improved when compared to January. Findings may reflect either sequela of age related infection or could potentially be related to  neoplasm/lymphoproliferative disorder. Would suggest correlation with PET imaging to select a site for sampling. This could also be utilized to assess pulmonary findings that have raise concern on recent imaging. More  masslike appearance of RIGHT upper lobe changes and LEFT upper lobe nodules as discussed above, consideration for  pulmonary neoplasm as discussed on recent imaging. Signs of pulmonary emphysema. Three-vessel coronary artery disease.  Low-attenuation cardiac blood pool compatible with anemia.  Stranding about jejunal loops in the upper abdomen is of uncertain significance but is associated with increasing nodal disease, perhaps related to inflammation or lymphatic congestion. Attention on follow-up.  Bilateral perinephric stranding which was not evident on most recent abdominal imaging. Correlation with urinalysis is suggested. Aortic Atherosclerosis (ICD10-I70.0) and Emphysema."   ID recommends continuing current plan of care transitioning to oral Augmentin for 5 days at discharge.  ID feels that they do not expect any meaningful recovery but patient wishes to continue his current treatments and wants to consider physical rehabilitation.  We will consult PT OT for further evaluation recommendations and they are recommending outpatient PT and OT.  Kurt Foley infectious diseases reached out to me to see if we can get a GI consult for this patient given his nausea vomiting and difficulty with his food.  GI evaluated and feels that multiple medications can account for his nausea vomiting but he does not think this is a primary GI process and feels that the patient's main concern is singultus.  GI has recommended hospice but is declined.  GI feels there is no need for endoscopy currently but if there is concern for infectious esophagitis empiric  treatment such as fluconazole to be considered we will defer this to infectious diseases.  We will place the patient on scheduled antiemetics and Kurt Foley spoke  with Kurt Foley.   Nutrition recommending changing Marinol to 8 AM and 3 PM daily for peak effectiveness.  Is unclear how much she is actually ingesting and taking in for his food and oral intake.  Calorie count has been initiated and ID has started the patient on micafungin given that he has reported dysphagia and cannot essentially rule out thrush; his CD4 count is improving.  Palliative is involved for further goals of care discussion     Assessment & Plan:   Principal Problem:   SIRS (systemic inflammatory response syndrome) (HCC) Active Problems:   Mass of upper lobe of right lung   Human immunodeficiency virus (HIV) disease (HCC)   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HCC)   Emphysema, unspecified ([place])   Abnormal LFTs   History of CVA with residual right hemiparesis   Pulmonary embolism (HCC)   Severe recurrent major depression without psychotic features ([place])   Tachycardia   Insomnia   Community acquired pneumonia of right lung   Mediastinal lymphadenopathy   Goals of care, counseling/discussion   Weakness   Encounter for palliative care   Oropharyngeal dysphagia   Protein-calorie malnutrition, severe  SIRS with recurrent fevers History of disseminated MAC infection - Seen by ID.  This is likely secondary to disseminated MAC history.  Current regimen-Biktarvy, azithromycin and ethambutol. Now on Rifabutin as well.  -ID feels that he has no real source of his fever but could be secondary to his disseminated MRSA infection -ID recommended GI evaluation given inability to keep food down and nausea and vomiting but GI evaluated and feels that he has no evidence of this and the patient's main issue is singultus.  Kurt Foley does not feel that his nausea vomiting is a primary GI process and recommending symptomatic treatment and scheduling antiemetics as well as possible treatment with fluconazole if concern for infectious esophagitis -GI signed off the case and has  recommended hospice but patient has declined; palliative continues to have goals of care discussion; ID started the patient on Mycobutin. ID discontinued antibiotics and recommends continuing current plan of care as his fever had improved.  However, patient once again spiked high-grade fever of 102.1 around 9 PM on 11/30/2021.  Awaiting ID to see patient and provide further recommendations.  Continue IV fluids.  Right upper lobe mass Dyspnea with shortness of breath  -Status post bronchoscopy with EBUS on 6/5.  Brushings remain negative.  Case discussed with IR who recommends outptn PET scan. Spoke with Dr Lamonte Sakai from [facility], who also reviewed images and recommended outptn PET followed by follow up with Dr Valeta Harms for discussion of possible Navigation Bronch. There may be a component of infection which may clear with time?   AKI/metabolic acidosis: Both had resolved initially.  creatinine jumped back to 1.5 on 11/30/2021, IV fluids started, AKI resolved.  We will continue IV fluids.  History of PE and DVT: Continue Xarelto.  History of CVA -Residual right-sided weakness with right hand and arm contractures.  On statin.  PT OT on board.  Emphysema -Bronchodilators to be continued.  Substance use -Counseled to quit using.  Depression -Continue with on Seroquel and Remeron   Hyponatremia: Resolved.   Normocytic Anemia: Stable.  GERD -Daily PPI with Pantoprazole 40 mg po Daily   Elevated LFTs: Improved.  Nausea/poor p.o. intake: Apparently patient has poor  p.o. intake which is being presumed due to nausea which could in turn be due to presumed esophageal candidiasis.  He is getting antifungal treatment for that.  Started on Reglan scheduled by [facility] care on 11/30/2021, he is denying any nausea.  He said that he ate some of his breakfast today as well.   Goals of care Severe protein calorie malnutrition -Advanced HIV with very guarded prognosis.  -Palliative care following-he wishes to be  full code -Supplements.  Marinol added.  Appreciate palliative care help.    DVT prophylaxis:    Code Status: Full Code  Family Communication:  None present at bedside.  Plan of care discussed with patient in length and he/she verbalized understanding and agreed with it.  Status is: Inpatient Remains inpatient appropriate because: With poor p.o. intake and symptomatic with nausea.   Estimated body mass index is 23.31 kg/m as calculated from the following:   Height as of this encounter: _0  (1.676 m).   Weight as of this encounter: 65.5 kg.    Nutritional Assessment: Body mass index is 23.31 kg/m.Marland Kitchen Seen by dietician.  I agree with the assessment and plan as outlined below: Nutrition Status: Nutrition Problem: Severe Malnutrition Etiology: acute illness Signs/Symptoms: energy intake < or equal to 50% for > or equal to 5 days, percent weight loss Interventions: Refer to RD note for recommendations, Ensure Enlive (each supplement provides 350kcal and 20 grams of protein)  . Skin Assessment: I have examined the patient's skin and I agree with the wound assessment as performed by the wound care RN as outlined below:    Consultants:  ID Palliative care  Procedures:  none  Antimicrobials:  Anti-infectives (From admission, onward)    Start     Dose/Rate Route Frequency Ordered Stop   11/30/21 0000  micafungin (MYCAMINE) 150 mg in sodium chloride 0.9 % 100 mL IVPB  Status:  Discontinued        150 mg 107.5 mL/hr over 1 Hours Intravenous Every 24 hours 11/29/21 1340 11/30/21 0756   11/29/21 0000  micafungin (MYCAMINE) 50 mg in sodium chloride 0.9 % 100 mL IVPB  Status:  Discontinued        50 mg 102.5 mL/hr over 1 Hours Intravenous Every 24 hours 11/28/21 1939 11/29/21 1340   11/28/21 2200  fostemsavir tromethamine (RUKOBIA) ER tablet 600 mg        600 mg Oral 2 times daily 11/28/21 1944     11/24/21 1000  abacavir-dolutegravir-lamiVUDine (TRIUMEQ) 600-50-300 MG per tablet  1 tablet        1 tablet Oral Daily 11/23/21 1107     11/23/21 1130  piperacillin-tazobactam (ZOSYN) IVPB 3.375 g  Status:  Discontinued        3.375 g 12.5 mL/hr over 240 Minutes Intravenous Every 8 hours 11/23/21 1042 11/28/21 1937   11/20/21 1500  rifabutin (MYCOBUTIN) capsule 300 mg        300 mg Oral Daily 11/20/21 1341     11/20/21 1430  sulfamethoxazole-trimethoprim (BACTRIM DS) 800-160 MG per tablet 1 tablet        1 tablet Oral Daily 11/20/21 1342     11/17/21 0800  vancomycin (VANCOCIN) IVPB 1000 mg/200 mL premix  Status:  Discontinued        1,000 mg 200 mL/hr over 60 Minutes Intravenous Every 12 hours 11/16/21 1807 11/17/21 1048   11/16/21 1830  vancomycin (VANCOREADY) IVPB 1500 mg/300 mL        1,500 mg 150 mL/hr over  120 Minutes Intravenous STAT 11/16/21 1758 11/16/21 2048   11/16/21 1800  ceFEPIme (MAXIPIME) 2 g in sodium chloride 0.9 % 100 mL IVPB  Status:  Discontinued        2 g 200 mL/hr over 30 Minutes Intravenous Every 8 hours 11/16/21 1756 11/17/21 1048   11/11/21 2200  azithromycin (ZITHROMAX) tablet 500 mg        500 mg Oral Daily 11/11/21 1511     11/10/21 2200  azithromycin (ZITHROMAX) 500 mg in sodium chloride 0.9 % 250 mL IVPB  Status:  Discontinued        500 mg 250 mL/hr over 60 Minutes Intravenous Every 24 hours 11/10/21 1316 11/11/21 1511   11/10/21 1400  cefTRIAXone (ROCEPHIN) 2 g in sodium chloride 0.9 % 100 mL IVPB  Status:  Discontinued        2 g 200 mL/hr over 30 Minutes Intravenous Every 24 hours 11/10/21 1316 11/11/21 1511   11/10/21 1000  ethambutol (MYAMBUTOL) tablet 800 mg        800 mg Oral Daily 11/09/21 1840     11/10/21 1000  sulfamethoxazole-trimethoprim (BACTRIM DS) 800-160 MG per tablet 1 tablet  Status:  Discontinued        1 tablet Oral Daily 11/09/21 1840 11/10/21 1316   11/09/21 2200  azithromycin (ZITHROMAX) tablet 500 mg  Status:  Discontinued        500 mg Oral Daily at bedtime 11/09/21 1909 11/10/21 1316   11/09/21 1830   vancomycin (VANCOREADY) IVPB 1500 mg/300 mL        1,500 mg 150 mL/hr over 120 Minutes Intravenous STAT 11/09/21 1817 11/09/21 2157   11/09/21 1830  piperacillin-tazobactam (ZOSYN) IVPB 3.375 g        3.375 g 100 mL/hr over 30 Minutes Intravenous STAT 11/09/21 1817 11/09/21 1932   11/09/21 1700  bictegravir-emtricitabine-tenofovir AF (BIKTARVY) 50-200-25 MG per tablet 1 tablet  Status:  Discontinued        1 tablet Oral Daily 11/09/21 1608 11/23/21 1107         Subjective:  Patient seen and examined.  He is alert and oriented.  He has no complaints.  Objective: Vitals:   11/30/21 1936 11/30/21 2127 12/01/21 0000 12/01/21 0058  BP:  97/65 95/68 107/63  Pulse:  (!) 105 (!) 102 82  Resp:  (!) 40 18 18  Temp:  (!) 102.1 F (38.9 C) 98.5 F (36.9 C) 99.2 F (37.3 C)  TempSrc:  Oral Oral Axillary  SpO2: 100% 100% 95% 96%  Weight:      Height:        Intake/Output Summary (Last 24 hours) at 12/01/2021 1312 Last data filed at 12/01/2021 1211 Gross per 24 hour  Intake 1187.93 ml  Output 750 ml  Net 437.93 ml    Filed Weights   11/09/21 2040 11/18/21 0430 11/28/21 1624  Weight: 67.9 kg 69.2 kg 65.5 kg    Examination:  General exam: Appears calm and comfortable, chronically sick and cachectic Respiratory system: Clear to auscultation. Respiratory effort normal. Cardiovascular system: S1 & S2 heard, RRR. No JVD, murmurs, rubs, gallops or clicks. No pedal edema. Gastrointestinal system: Abdomen is nondistended, soft and nontender. No organomegaly or masses felt. Normal bowel sounds heard. Central nervous system: Alert and oriented.  Right hemiparesis with contractures of the right upper and lower extremity and right facial droop. Extremities: Symmetric 5 x 5 power. Skin: No rashes, lesions or ulcers.   Data Reviewed: I have personally reviewed following labs  and imaging studies  CBC: Recent Labs  Lab 11/26/21 0530 11/27/21 0505 11/28/21 0344 11/29/21 0443  11/30/21 0557  WBC 5.8 5.3 6.6 6.6 15.0*  NEUTROABS 4.4 3.7 4.7 5.1 12.1*  HGB 8.0* 7.9* 7.9* 8.8* 8.3*  HCT 26.7* 26.6* 25.9* 28.7* 26.8*  MCV 90.8 91.1 89.0 90.5 92.4  PLT 466* 445* 474* 497* 453*    Basic Metabolic Panel: Recent Labs  Lab 11/26/21 0530 11/27/21 0505 11/28/21 0344 11/29/21 0443 11/30/21 0557 12/01/21 0456  NA 136 133* 136 135 136 136  K 4.3 4.4 4.3 4.3 4.9 4.9  CL 108 106 105 106 104 107  CO2 19* 18* 19* 18* 25 19*  GLUCOSE 95 98 94 90 92 94  BUN _0 23* 11  CREATININE 0.88 0.89 0.86 0.98 1.50* 0.96  CALCIUM 9.4 9.4 10.0 10.3 11.1* 10.2  MG 2.1 2.0 2.1 2.0 2.1  --   PHOS 4.4 3.9 4.6 3.9 3.4  --     GFR: Estimated Creatinine Clearance: 85.8 mL/min (by C-G formula based on SCr of 0.96 mg/dL). Liver Function Tests: Recent Labs  Lab 11/27/21 0505 11/28/21 0344 11/29/21 0443 11/30/21 0557 12/01/21 0456  AST 47* 52* 51* 29 66*  ALT 39 43 43 13 50*  ALKPHOS 262* 273* 276* 286* 330*  BILITOT 0.5 0.7 0.9 0.6 0.6  PROT 7.0 7.5 7.8 6.9 8.0  ALBUMIN 2.6* 2.6* 2.8* 2.4* 2.8*    No results for input(s): "LIPASE", "AMYLASE" in the last 168 hours. No results for input(s): "AMMONIA" in the last 168 hours. Coagulation Profile: No results for input(s): "INR", "PROTIME" in the last 168 hours. Cardiac Enzymes: No results for input(s): "CKTOTAL", "CKMB", "CKMBINDEX", "TROPONINI" in the last 168 hours. BNP (last 3 results) No results for input(s): "PROBNP" in the last 8760 hours. HbA1C: No results for input(s): "HGBA1C" in the last 72 hours. CBG: Recent Labs  Lab 11/25/21 2105  GLUCAP 106*    Lipid Profile: No results for input(s): "CHOL", "HDL", "LDLCALC", "TRIG", "CHOLHDL", "LDLDIRECT" in the last 72 hours. Thyroid Function Tests: No results for input(s): "TSH", "T4TOTAL", "FREET4", "T3FREE", "THYROIDAB" in the last 72 hours. Anemia Panel: No results for input(s): "VITAMINB12", "FOLATE", "FERRITIN", "TIBC", "IRON", "RETICCTPCT" in the last  72 hours. Sepsis Labs: No results for input(s): "PROCALCITON", "LATICACIDVEN" in the last 168 hours.  Recent Results (from the past 240 hour(s))  Culture, blood (Routine X 2) w Reflex to ID Panel     Status: None   Collection Time: 11/21/21  4:18 PM   Specimen: BLOOD  Result Value Ref Range Status   Specimen Description   Final    BLOOD BLOOD LEFT FOREARM Performed at [facility] [address], [place], [place] [address]    Special Requests   Final    BOTTLES DRAWN AEROBIC ONLY Blood Culture results may not be optimal due to an inadequate volume of blood received in culture bottles Performed at [facility] [address] [place], [place] [address]    Culture   Final    NO GROWTH 5 DAYS Performed at [facility], [place] [address], [place], [place] [address]    Report Status 11/26/2021 FINAL  Final  Culture, blood (Routine X 2) w Reflex to ID Panel     Status: None   Collection Time: 11/21/21  4:18 PM   Specimen: BLOOD  Result Value Ref Range Status   Specimen Description   Final    BLOOD BLOOD LEFT  WRIST Performed at [facility], [place] [address], [place], [place] [address]    Special Requests   Final    IN PEDIATRIC BOTTLE Blood Culture adequate volume Performed at [facility] [address] [place], [place] [address]    Culture   Final    NO GROWTH 5 DAYS Performed at [facility], [place] [address] [place], [place] [address]    Report Status 11/26/2021 FINAL  Final     Radiology Studies: No results found.  Scheduled Meds:  abacavir-dolutegravir-lamiVUDine  1 tablet Oral Daily   azithromycin  500 mg Oral Daily   B-complex with vitamin C  1 tablet Oral Daily   dronabinol  5 mg Oral TID AC   ethambutol  800 mg Oral Daily   feeding supplement  237 mL Oral QID   fostemsavir tromethamine  600 mg Oral BID   iohexol  500 mL Oral Q1H   ketoconazole   Topical BID   metoCLOPramide  (REGLAN) injection  5 mg Intravenous Q6H   metoprolol succinate  100 mg Oral q morning   mirtazapine  15 mg Oral QHS   mometasone-formoterol  2 puff Inhalation BID   nystatin  5 mL Mouth/Throat QID   pantoprazole  40 mg Oral QAC breakfast   QUEtiapine  200 mg Oral QHS   rifabutin  300 mg Oral Daily   rivaroxaban  20 mg Oral Q supper   rosuvastatin  5 mg Oral q morning   sodium chloride flush  3 mL Intravenous Q12H   sulfamethoxazole-trimethoprim  1 tablet Oral Daily   umeclidinium bromide  1 puff Inhalation Daily   Continuous Infusions:  sodium chloride Stopped (11/30/21 2113)     LOS: 21 days   Darliss Cheney, MD [facility]  12/01/2021, 1:12 PM   *Please note that this is a verbal dictation therefore any spelling or grammatical errors are due to the "[place] [employer]" system interpretation.  Please page via [place] and do not message via secure chat for urgent patient care matters. Secure chat can be used for non urgent patient care matters.  How to contact the [facility] Attending or Consulting provider [place] or covering provider during after hours [place], for this patient?  Check the care team in [facility] and look for a) attending/consulting [facility] provider listed and b) the [facility] team listed. Page or secure chat [phone]. Log into [email or web address] and use [place]'s universal password to access. If you do not have the password, please contact the hospital operator. Locate the [facility] provider you are looking for under [facility] and page to a number that you can be directly reached. If you still have difficulty reaching the provider, please page the [facility] (Director on Call) for the Hospitalists listed on amion for assistance.

## 2021-12-01 NOTE — Progress Notes (Signed)
   12/01/21 1331  Assess: MEWS Score  Temp (!) 101.8 F (38.8 C)  BP 101/73  MAP (mmHg) 82  Pulse Rate (!) 115  Resp (!) 43  Level of Consciousness Alert  SpO2 94 %  O2 Device Room Air  Assess: MEWS Score  MEWS Temp 2  MEWS Systolic 0  MEWS Pulse 2  MEWS RR 3  MEWS LOC 0  MEWS Score 7  MEWS Score Color Red  Assess: if the MEWS score is Yellow or Red  Were vital signs taken at a resting state? Yes  Focused Assessment Change from prior assessment (see assessment flowsheet)  Does the patient meet 2 or more of the SIRS criteria? Yes  Does the patient have a confirmed or suspected source of infection? Yes  Provider and Rapid Response Notified? Yes  MEWS guidelines implemented *See Row Information* Yes  Treat  MEWS Interventions Administered prn meds/treatments  Pain Scale 0-10  Pain Score 10  Pain Type Chronic pain  Pain Location Back  Pain Orientation Lower  Pain Descriptors / Indicators Aching  Pain Frequency Constant  Pain Onset On-going  Pain Intervention(s) Medication (See eMAR)  Interventions Medication (see MAR)  Take Vital Signs  Increase Vital Sign Frequency  Red: Q 1hr X 4 then Q 4hr X 4, if remains red, continue Q 4hrs  Escalate  MEWS: Escalate Red: discuss with charge nurse/RN and provider, consider discussing with RRT  Assess: SIRS CRITERIA  SIRS Temperature  1  SIRS Pulse 1  SIRS Respirations  1  SIRS WBC 0  SIRS Score Sum  3   MD notified. CN notified.

## 2021-12-01 NOTE — Progress Notes (Addendum)
Pt refused IV insertion. York Spaniel he would talk about getting one later in the morning.

## 2021-12-02 ENCOUNTER — Inpatient Hospital Stay (HOSPITAL_COMMUNITY): Payer: Medicaid Other

## 2021-12-02 DIAGNOSIS — R591 Generalized enlarged lymph nodes: Secondary | ICD-10-CM | POA: Diagnosis present

## 2021-12-02 DIAGNOSIS — R7989 Other specified abnormal findings of blood chemistry: Secondary | ICD-10-CM | POA: Diagnosis not present

## 2021-12-02 DIAGNOSIS — N179 Acute kidney failure, unspecified: Secondary | ICD-10-CM | POA: Diagnosis not present

## 2021-12-02 DIAGNOSIS — R6510 Systemic inflammatory response syndrome (SIRS) of non-infectious origin without acute organ dysfunction: Secondary | ICD-10-CM | POA: Diagnosis not present

## 2021-12-02 DIAGNOSIS — B20 Human immunodeficiency virus [HIV] disease: Secondary | ICD-10-CM | POA: Diagnosis not present

## 2021-12-02 LAB — LACTATE DEHYDROGENASE: LDH: 179 U/L (ref 98–192)

## 2021-12-02 MED ORDER — GADOBUTROL 1 MMOL/ML IV SOLN
7.0000 mL | Freq: Once | INTRAVENOUS | Status: AC | PRN
  Administered 2021-12-02: 7 mL via INTRAVENOUS

## 2021-12-02 MED ORDER — SODIUM CHLORIDE 0.9 % IV SOLN
3.0000 g | Freq: Four times a day (QID) | INTRAVENOUS | Status: DC
  Administered 2021-12-02 – 2021-12-07 (×20): 3 g via INTRAVENOUS
  Filled 2021-12-02 (×23): qty 8

## 2021-12-02 MED ORDER — ORAL CARE MOUTH RINSE: 15.0000 mL | OROMUCOSAL | Status: DC | PRN

## 2021-12-02 MED ORDER — LORAZEPAM 2 MG/ML IJ SOLN
1.0000 mg | Freq: Once | INTRAMUSCULAR | Status: AC
  Administered 2021-12-02: 1 mg via INTRAVENOUS
  Filled 2021-12-02 (×2): qty 1

## 2021-12-02 NOTE — Progress Notes (Signed)
Request received from ID for neck fluid collection aspiration that is recurrent. Pt consulted at bedside for aspiration tentatively scheduled for 12/05/21. Procedure was explained to patient. He understands that aspiration will be with local anesthetic. The patient states he has no questions.  Consent signed in chart.    Alex Gardener, AGNP-BC 12/02/2021, 4:22 PM

## 2021-12-03 ENCOUNTER — Inpatient Hospital Stay (HOSPITAL_COMMUNITY): Payer: Medicaid Other

## 2021-12-03 ENCOUNTER — Encounter (HOSPITAL_COMMUNITY): Payer: Self-pay | Admitting: Internal Medicine

## 2021-12-03 DIAGNOSIS — R591 Generalized enlarged lymph nodes: Secondary | ICD-10-CM | POA: Diagnosis not present

## 2021-12-03 LAB — CBC WITH DIFFERENTIAL/PLATELET
Abs Immature Granulocytes: 0.11 10*3/uL — ABNORMAL HIGH (ref 0.00–0.07)
Basophils Absolute: 0 10*3/uL (ref 0.0–0.1)
Basophils Relative: 0 %
Eosinophils Absolute: 0.1 10*3/uL (ref 0.0–0.5)
Eosinophils Relative: 1 %
HCT: 27 % — ABNORMAL LOW (ref 39.0–52.0)
Hemoglobin: 8 g/dL — ABNORMAL LOW (ref 13.0–17.0)
Immature Granulocytes: 1 %
Lymphocytes Relative: 10 %
Lymphs Abs: 0.8 10*3/uL (ref 0.7–4.0)
MCH: 27 pg (ref 26.0–34.0)
MCHC: 29.6 g/dL — ABNORMAL LOW (ref 30.0–36.0)
MCV: 91.2 fL (ref 80.0–100.0)
Monocytes Absolute: 0.6 10*3/uL (ref 0.1–1.0)
Monocytes Relative: 7 %
Neutro Abs: 6.8 10*3/uL (ref 1.7–7.7)
Neutrophils Relative %: 81 %
Platelets: 481 10*3/uL — ABNORMAL HIGH (ref 150–400)
RBC: 2.96 MIL/uL — ABNORMAL LOW (ref 4.22–5.81)
RDW: 16.1 % — ABNORMAL HIGH (ref 11.5–15.5)
WBC: 8.4 10*3/uL (ref 4.0–10.5)
nRBC: 0 % (ref 0.0–0.2)

## 2021-12-03 LAB — BASIC METABOLIC PANEL
Anion gap: 9 (ref 5–15)
BUN: 5 mg/dL — ABNORMAL LOW (ref 6–20)
CO2: 16 mmol/L — ABNORMAL LOW (ref 22–32)
Calcium: 9.1 mg/dL (ref 8.9–10.3)
Chloride: 110 mmol/L (ref 98–111)
Creatinine, Ser: 0.69 mg/dL (ref 0.61–1.24)
GFR, Estimated: >60 mL/min (ref >60)
Glucose, Bld: 124 mg/dL — ABNORMAL HIGH (ref 70–99)
Potassium: 3.7 mmol/L (ref 3.5–5.1)
Sodium: 135 mmol/L (ref 135–145)

## 2021-12-03 MED ORDER — CHLORPROMAZINE HCL 25 MG PO TABS
25.0000 mg | ORAL_TABLET | Freq: Three times a day (TID) | ORAL | Status: DC | PRN
Start: 2021-12-03 — End: 2021-12-07
  Administered 2021-12-03 – 2021-12-05 (×3): 25 mg via ORAL
  Filled 2021-12-03 (×5): qty 1

## 2021-12-03 MED ORDER — FENTANYL CITRATE (PF) 100 MCG/2ML IJ SOLN
INTRAMUSCULAR | Status: AC
  Filled 2021-12-03: qty 2

## 2021-12-03 MED ORDER — MIDAZOLAM HCL 2 MG/2ML IJ SOLN
INTRAMUSCULAR | Status: AC
  Filled 2021-12-03: qty 2

## 2021-12-03 MED ORDER — FENTANYL CITRATE (PF) 100 MCG/2ML IJ SOLN
INTRAMUSCULAR | Status: AC | PRN
  Administered 2021-12-03: 25 ug via INTRAVENOUS

## 2021-12-03 MED ORDER — BUPIVACAINE HCL (PF) 0.5 % IJ SOLN
INTRAMUSCULAR | Status: AC
  Filled 2021-12-03: qty 30

## 2021-12-03 MED ORDER — LIDOCAINE HCL 1 % IJ SOLN
INTRAMUSCULAR | Status: DC
Start: 2021-12-03 — End: 2021-12-03
  Filled 2021-12-03: qty 20

## 2021-12-03 MED ORDER — MIDAZOLAM HCL 2 MG/2ML IJ SOLN
INTRAMUSCULAR | Status: AC | PRN
  Administered 2021-12-03: .5 mg via INTRAVENOUS

## 2021-12-03 NOTE — Sedation Documentation (Signed)
7ml of fluid aspirated and sent to lab

## 2021-12-03 NOTE — Plan of Care (Signed)

## 2021-12-04 DIAGNOSIS — R591 Generalized enlarged lymph nodes: Secondary | ICD-10-CM | POA: Diagnosis not present

## 2021-12-04 NOTE — Progress Notes (Signed)
PROGRESS NOTE    Kurt Foley  ZDG:[id number] DOB: Oct 09, 1975 DOA: 11/09/2021 PCP: Claiborne Rigg, NP   Brief Narrative:  46 year old with advanced AIDS, disseminated MAC substance abuse, depression, prior CVA with residual right-sided weakness, pulmonary embolism on Xarelto admitted for cough and palpitations.  Repeat CT showed abnormal lung parenchyma.  He was seen by pulmonary, cultures remain negative.  Underwent bronchoscopy with EBUS on 6/5 with subcarinal lymph node sampling and brushing with BAL of right upper lobe mass.  Brushings remain negative, IR consulted for CT-guided biopsy.  Patient off-and-on spiking fever, discussed with ID and this is likely due to MAC.  Patient has also been seen by palliative care service, wishes to be full code. Unforturnately continues to spike fevers daily leading to tachycardia.    ID is now changing antibiotics and trying IV Zosyn.  Continues to have temperatures and tachycardia along with some back pain but fever seems improved this morning and ID recommends continuing current antibiotics and regimen at this time.  Palliative care is increasing his blood but now and PT OT recommending outpatient PT.   Given his recurrent fevers ID is now checking a CT of the chest abdomen pelvis to see if there is anything new that they could consider; otherwise that they are going to continue his current plan however they have changed his HIV medication from [place] to Triumeq.  He has not changed his morphine p.o. to oxycodone.  Given his poor appetite we will start him on D5W half-normal saline at 75 MLS per hour and also given 500 mill bolus given his slight hypotension.   CT Scan of the Chest/Abd/Pelvis done and showed "Worsening nodal disease since recent imaging from May in April in the chest and abdomen though improved when compared to January. Findings may reflect either sequela of age related infection or could potentially be related to  neoplasm/lymphoproliferative disorder. Would suggest correlation with PET imaging to select a site for sampling. This could also be utilized to assess pulmonary findings that have raise concern on recent imaging. More  masslike appearance of RIGHT upper lobe changes and LEFT upper lobe nodules as discussed above, consideration for  pulmonary neoplasm as discussed on recent imaging. Signs of pulmonary emphysema. Three-vessel coronary artery disease.  Low-attenuation cardiac blood pool compatible with anemia.  Stranding about jejunal loops in the upper abdomen is of uncertain significance but is associated with increasing nodal disease, perhaps related to inflammation or lymphatic congestion. Attention on follow-up.  Bilateral perinephric stranding which was not evident on most recent abdominal imaging. Correlation with urinalysis is suggested. Aortic Atherosclerosis (ICD10-I70.0) and Emphysema."   ID recommends continuing current plan of care transitioning to oral Augmentin for 5 days at discharge.  ID feels that they do not expect any meaningful recovery but patient wishes to continue his current treatments and wants to consider physical rehabilitation.  We will consult PT OT for further evaluation recommendations and they are recommending outpatient PT and OT.  [name] infectious diseases reached out to me to see if we can get a GI consult for this patient given his nausea vomiting and difficulty with his food.  GI evaluated and feels that multiple medications can account for his nausea vomiting but he does not think this is a primary GI process and feels that the patient's main concern is singultus.  GI has recommended hospice but is declined.  GI feels there is no need for endoscopy currently but if there is concern for infectious esophagitis empiric  treatment such as fluconazole to be considered we will defer this to infectious diseases.  We will place the patient on scheduled antiemetics and Dr. Dulce Sellar spoke  with Dr. Luciana Axe.   Nutrition recommending changing Marinol to 8 AM and 3 PM daily for peak effectiveness.  Is unclear how much she is actually ingesting and taking in for his food and oral intake.  Calorie count has been initiated and ID has started the patient on micafungin given that he has reported dysphagia and cannot essentially rule out thrush; his CD4 count is improving.  Palliative is involved for further goals of care discussion     Assessment & Plan:   Principal Problem:   Lymphadenopathy Active Problems:   Mass of upper lobe of right lung   Human immunodeficiency virus (HIV) disease (HCC)   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HCC)   Emphysema, unspecified (HCC)   Abnormal LFTs   History of CVA with residual right hemiparesis   Pulmonary embolism (HCC)   Severe recurrent major depression without psychotic features (HCC)   Tachycardia   Insomnia   SIRS (systemic inflammatory response syndrome) (HCC)   Community acquired pneumonia of right lung   Mediastinal lymphadenopathy   Goals of care, counseling/discussion   Weakness   Encounter for palliative care   Oropharyngeal dysphagia   Protein-calorie malnutrition, severe  SIRS with recurrent fevers History of disseminated MAC infection - Seen by ID.  This is likely secondary to disseminated MAC history.  Current regimen-Biktarvy, azithromycin and ethambutol. Now on Rifabutin as well.  -ID feels that he has no real source of his fever but could be secondary to his disseminated MRSA infection -ID recommended GI evaluation given inability to keep food down and nausea and vomiting but GI evaluated and feels that he has no evidence of this and the patient's main issue is singultus.  Dr. Modena Nunnery does not feel that his nausea vomiting is a primary GI process and recommending symptomatic treatment and scheduling antiemetics as well as possible treatment with fluconazole if concern for infectious esophagitis -GI signed off  the case and has recommended hospice but patient has declined; palliative continues to have goals of care discussion; ID started the patient on Mycobutin. ID discontinued antibiotics and decided to watch off antibiotics.  However, patient has been having persistent fever since 11/30/2021.  CT chest abdomen ordered by ID on 12/01/2021 shows extensive necrotic appearing lymph nodes in the chest and abdomen with right upper lobe lesion as well as psoas abscesses that seem associated with necrotic adenopathy in the retroperitoneum.  ID has reinitiated patient on Zosyn.  IR has been consulted and patient is scheduled to have biopsy of 1 of those lymph nodes.  IR also drained his iliopsoas abscess, cultures pending.  Antibiotics per ID.    Right upper lobe mass: Dyspnea with shortness of breath  -Status post bronchoscopy with EBUS on 6/5.  Brushings remain negative.  Case discussed with IR who recommends outptn PET scan. Spoke with Dr Delton Coombes from [place], who also reviewed images and recommended outptn PET followed by follow up with Dr Tonia Brooms for discussion of possible Navigation Bronch. There may be a component of infection which may clear with time?   AKI/metabolic acidosis: Both had resolved initially.  creatinine jumped back to 1.5 on 11/30/2021, IV fluids started, AKI resolved.  Will discontinue IV fluids now.  History of PE and DVT: Continue Xarelto, no recommendations to hold Xarelto from IR.  History of CVA -Residual right-sided weakness with right hand  and arm contractures.  On statin.  PT OT on board.  Emphysema -Bronchodilators to be continued.  Substance use -Counseled to quit using.  Depression -Continue with on Seroquel and Remeron   Hyponatremia: Resolved.   Normocytic Anemia: Stable.  GERD -Daily PPI with Pantoprazole 40 mg po Daily   Elevated LFTs: Improved.  Nausea/poor p.o. intake: Symptoms improving.  Continue Reglan.   Goals of care Severe protein calorie  malnutrition -Advanced HIV with very guarded prognosis.  -Palliative care following-he wishes to be full code -Supplements.  Marinol added.  Appreciate palliative care help.    DVT prophylaxis:    Code Status: Full Code  Family Communication:  None present at bedside.  Plan of care discussed with patient in length and he/she verbalized understanding and agreed with it.  Received a message from primary nurse that his aunt French Ana was requesting a phone call.  I tried calling yesterday as well as today, no one picked up, voicemail box is full.  Status is: Inpatient Remains inpatient appropriate because: Patient with persistent fevers.  Estimated body mass index is 23.31 kg/m as calculated from the following:   Height as of this encounter: 5\' 6"  (1.676 m).   Weight as of this encounter: 65.5 kg.    Nutritional Assessment: Body mass index is 23.31 kg/m.Marland Kitchen Seen by dietician.  I agree with the assessment and plan as outlined below: Nutrition Status: Nutrition Problem: Severe Malnutrition Etiology: acute illness Signs/Symptoms: energy intake < or equal to 50% for > or equal to 5 days, percent weight loss Interventions: Refer to RD note for recommendations, Ensure Enlive (each supplement provides 350kcal and 20 grams of protein)  . Skin Assessment: I have examined the patient's skin and I agree with the wound assessment as performed by the wound care RN as outlined below:    Consultants:  ID Palliative care  Procedures:  As above  Antimicrobials:  Anti-infectives (From admission, onward)    Start     Dose/Rate Route Frequency Ordered Stop   12/02/21 1415  Ampicillin-Sulbactam (UNASYN) 3 g in sodium chloride 0.9 % 100 mL IVPB        3 g 200 mL/hr over 30 Minutes Intravenous Every 6 hours 12/02/21 1327     11/30/21 0000  micafungin (MYCAMINE) 150 mg in sodium chloride 0.9 % 100 mL IVPB  Status:  Discontinued        150 mg 107.5 mL/hr over 1 Hours Intravenous Every 24 hours  11/29/21 1340 11/30/21 0756   11/29/21 0000  micafungin (MYCAMINE) 50 mg in sodium chloride 0.9 % 100 mL IVPB  Status:  Discontinued        50 mg 102.5 mL/hr over 1 Hours Intravenous Every 24 hours 11/28/21 1939 11/29/21 1340   11/28/21 2200  fostemsavir tromethamine (RUKOBIA) ER tablet 600 mg        600 mg Oral 2 times daily 11/28/21 1944     11/24/21 1000  abacavir-dolutegravir-lamiVUDine (TRIUMEQ) 600-50-300 MG per tablet 1 tablet        1 tablet Oral Daily 11/23/21 1107     11/23/21 1130  piperacillin-tazobactam (ZOSYN) IVPB 3.375 g  Status:  Discontinued        3.375 g 12.5 mL/hr over 240 Minutes Intravenous Every 8 hours 11/23/21 1042 11/28/21 1937   11/20/21 1500  rifabutin (MYCOBUTIN) capsule 300 mg        300 mg Oral Daily 11/20/21 1341     11/20/21 1430  sulfamethoxazole-trimethoprim (BACTRIM DS) 800-160 MG per tablet  1 tablet        1 tablet Oral Daily 11/20/21 1342     11/17/21 0800  vancomycin (VANCOCIN) IVPB 1000 mg/200 mL premix  Status:  Discontinued        1,000 mg 200 mL/hr over 60 Minutes Intravenous Every 12 hours 11/16/21 1807 11/17/21 1048   11/16/21 1830  vancomycin (VANCOREADY) IVPB 1500 mg/300 mL        1,500 mg 150 mL/hr over 120 Minutes Intravenous STAT 11/16/21 1758 11/16/21 2048   11/16/21 1800  ceFEPIme (MAXIPIME) 2 g in sodium chloride 0.9 % 100 mL IVPB  Status:  Discontinued        2 g 200 mL/hr over 30 Minutes Intravenous Every 8 hours 11/16/21 1756 11/17/21 1048   11/11/21 2200  azithromycin (ZITHROMAX) tablet 500 mg        500 mg Oral Daily 11/11/21 1511     11/10/21 2200  azithromycin (ZITHROMAX) 500 mg in sodium chloride 0.9 % 250 mL IVPB  Status:  Discontinued        500 mg 250 mL/hr over 60 Minutes Intravenous Every 24 hours 11/10/21 1316 11/11/21 1511   11/10/21 1400  cefTRIAXone (ROCEPHIN) 2 g in sodium chloride 0.9 % 100 mL IVPB  Status:  Discontinued        2 g 200 mL/hr over 30 Minutes Intravenous Every 24 hours 11/10/21 1316 11/11/21 1511    11/10/21 1000  ethambutol (MYAMBUTOL) tablet 800 mg        800 mg Oral Daily 11/09/21 1840     11/10/21 1000  sulfamethoxazole-trimethoprim (BACTRIM DS) 800-160 MG per tablet 1 tablet  Status:  Discontinued        1 tablet Oral Daily 11/09/21 1840 11/10/21 1316   11/09/21 2200  azithromycin (ZITHROMAX) tablet 500 mg  Status:  Discontinued        500 mg Oral Daily at bedtime 11/09/21 1909 11/10/21 1316   11/09/21 1830  vancomycin (VANCOREADY) IVPB 1500 mg/300 mL        1,500 mg 150 mL/hr over 120 Minutes Intravenous STAT 11/09/21 1817 11/09/21 2157   11/09/21 1830  piperacillin-tazobactam (ZOSYN) IVPB 3.375 g        3.375 g 100 mL/hr over 30 Minutes Intravenous STAT 11/09/21 1817 11/09/21 1932   11/09/21 1700  bictegravir-emtricitabine-tenofovir AF (BIKTARVY) 50-200-25 MG per tablet 1 tablet  Status:  Discontinued        1 tablet Oral Daily 11/09/21 1608 11/23/21 1107         Subjective:  Seen and examined.  He feels better.  No new complaint.  Objective: Vitals:   12/04/21 0500 12/04/21 0805 12/04/21 0944 12/04/21 1253  BP: 139/76 112/82 97/70 94/75   Pulse:  (!) 131 (!) 117 (!) 109  Resp: (!) 32 (!) 24 (!) 32 18  Temp:  (!) 100.7 F (38.2 C) 100 F (37.8 C) 97.9 F (36.6 C)  TempSrc:  Oral Oral Oral  SpO2: 95% 93% 96% 100%  Weight:      Height:        Intake/Output Summary (Last 24 hours) at 12/04/2021 1322 Last data filed at 12/04/2021 0630 Gross per 24 hour  Intake 2713.83 ml  Output 925 ml  Net 1788.83 ml    Filed Weights   11/09/21 2040 11/18/21 0430 11/28/21 1624  Weight: 67.9 kg 69.2 kg 65.5 kg    Examination:  General exam: Appears calm and comfortable, cachectic and chronically sick looking Respiratory system: Clear to auscultation. Respiratory effort normal.  Cardiovascular system: S1 & S2 heard, RRR. No JVD, murmurs, rubs, gallops or clicks. No pedal edema. Gastrointestinal system: Abdomen is nondistended, soft and nontender. No organomegaly or masses  felt. Normal bowel sounds heard. Central nervous system: Alert and oriented.  Right hemiparesis with contractures and right facial droop. Extremities: Symmetric 5 x 5 power. Skin: No rashes, lesions or ulcers.  Psychiatry: Judgement and insight appear poor Data Reviewed: I have personally reviewed following labs and imaging studies  CBC: Recent Labs  Lab 11/28/21 0344 11/29/21 0443 11/30/21 0557 12/03/21 1035  WBC 6.6 6.6 15.0* 8.4  NEUTROABS 4.7 5.1 12.1* 6.8  HGB 7.9* 8.8* 8.3* 8.0*  HCT 25.9* 28.7* 26.8* 27.0*  MCV 89.0 90.5 92.4 91.2  PLT 474* 497* 453* 481*    Basic Metabolic Panel: Recent Labs  Lab 11/28/21 0344 11/29/21 0443 11/30/21 0557 12/01/21 0456 12/03/21 1036  NA 136 135 136 136 135  K 4.3 4.3 4.9 4.9 3.7  CL 105 106 104 107 110  CO2 19* 18* 25 19* 16*  GLUCOSE 94 90 92 94 124*  BUN 10 11 23* 11 5*  CREATININE 0.86 0.98 1.50* 0.96 0.69  CALCIUM 10.0 10.3 11.1* 10.2 9.1  MG 2.1 2.0 2.1  --   --   PHOS 4.6 3.9 3.4  --   --     GFR: Estimated Creatinine Clearance: 103 mL/min (by C-G formula based on SCr of 0.69 mg/dL). Liver Function Tests: Recent Labs  Lab 11/28/21 0344 11/29/21 0443 11/30/21 0557 12/01/21 0456  AST 52* 51* 29 66*  ALT 43 43 13 50*  ALKPHOS 273* 276* 286* 330*  BILITOT 0.7 0.9 0.6 0.6  PROT 7.5 7.8 6.9 8.0  ALBUMIN 2.6* 2.8* 2.4* 2.8*    No results for input(s): "LIPASE", "AMYLASE" in the last 168 hours. No results for input(s): "AMMONIA" in the last 168 hours. Coagulation Profile: No results for input(s): "INR", "PROTIME" in the last 168 hours. Cardiac Enzymes: No results for input(s): "CKTOTAL", "CKMB", "CKMBINDEX", "TROPONINI" in the last 168 hours. BNP (last 3 results) No results for input(s): "PROBNP" in the last 8760 hours. HbA1C: No results for input(s): "HGBA1C" in the last 72 hours. CBG: No results for input(s): "GLUCAP" in the last 168 hours.  Lipid Profile: No results for input(s): "CHOL", "HDL",  "LDLCALC", "TRIG", "CHOLHDL", "LDLDIRECT" in the last 72 hours. Thyroid Function Tests: No results for input(s): "TSH", "T4TOTAL", "FREET4", "T3FREE", "THYROIDAB" in the last 72 hours. Anemia Panel: No results for input(s): "VITAMINB12", "FOLATE", "FERRITIN", "TIBC", "IRON", "RETICCTPCT" in the last 72 hours. Sepsis Labs: No results for input(s): "PROCALCITON", "LATICACIDVEN" in the last 168 hours.  Recent Results (from the past 240 hour(s))  Aerobic/Anaerobic Culture w Gram Stain (surgical/deep wound)     Status: None (Preliminary result)   Collection Time: 12/03/21 12:19 PM   Specimen: Abscess  Result Value Ref Range Status   Specimen Description   Final    ABSCESS Performed at [facility], [address], [place], [place] [address]    Special Requests   Final    Immunocompromised Performed at [facility], [address] [place], [place] [address]    Gram Stain   Final    ABUNDANT WBC PRESENT,BOTH PMN AND MONONUCLEAR NO ORGANISMS SEEN    Culture   Final    NO GROWTH < 24 HOURS Performed at [facility], [address] [place], [place] [address]    Report Status PENDING  Incomplete  Radiology Studies: CT ASPIRATION  Result Date: 12/03/2021 INDICATION: Small bilateral psoas abscesses, left greater than right, and history of chronic HIV. Aspiration of psoas collection requested for diagnostic purposes. EXAM: ASPIRATION OF LEFT PSOAS ABSCESS UNDER CT GUIDANCE MEDICATIONS: The patient is currently admitted to the hospital and receiving intravenous antibiotics. The antibiotics were administered within an appropriate time frame prior to the initiation of the procedure. ANESTHESIA/SEDATION: Moderate (conscious) sedation was employed during this procedure. A total of Versed 1.0 mg and Fentanyl 25 mcg was administered intravenously by the radiology nurse. Total intra-service moderate Sedation Time: 10 minutes. The patient's level of  consciousness and vital signs were monitored continuously by radiology nursing throughout the procedure under my direct supervision. COMPLICATIONS: None immediate. PROCEDURE: Informed written consent was obtained from the patient after a thorough discussion of the procedural risks, benefits and alternatives. All questions were addressed. Maximal Sterile Barrier Technique was utilized including caps, mask, sterile gowns, sterile gloves, sterile drape, hand hygiene and skin antiseptic. A timeout was performed prior to the initiation of the procedure. CT was performed through the abdomen in a prone position. A site was chosen along the left translumbar region and the skin prepped with chlorhexidine. Local anesthesia was provided with 0.5% bupivacaine as the patient is allergic to lidocaine. An 18 gauge trocar needle was advanced from a left translumbar approach into the left psoas muscle at roughly the L3 level. Fluid was aspirated from the needle and the needle removed. The entire fluid sample was sent for culture analysis. FINDINGS: Fluid collection in the anterior aspect of the left psoas muscle was targeted. Aspiration yielded purulent fluid. A total of 7 mL of fluid was able to be aspirated from the collection. IMPRESSION: CT-guided aspiration of left anterior psoas abscess fluid collection yielding 7 mL of purulent fluid. The fluid sample was sent for culture analysis. Electronically Signed   By: Irish Lack M.D.   On: 12/03/2021 12:50   MR Lumbar Spine W Wo Contrast  Result Date: 12/02/2021 CLINICAL DATA:  Initial evaluation for possible spinal infection. 46 year old male with history of HIV with psoas abscesses on prior CT. EXAM: MRI LUMBAR SPINE WITHOUT AND WITH CONTRAST TECHNIQUE: Multiplanar and multiecho pulse sequences of the lumbar spine were obtained without and with intravenous contrast. CONTRAST:  7mL GADAVIST GADOBUTROL 1 MMOL/ML IV SOLN COMPARISON:  Prior CT from 12/01/2021. FINDINGS:  Segmentation: Transitional lumbosacral anatomy with sacralization of the L5 vertebral body. Same numbering system employed as on previous exam. Alignment: Vertebral bodies normally aligned with preservation of the normal lumbar lordosis. No listhesis. Vertebrae: Vertebral body height maintained without acute or chronic fracture. Bone marrow signal diffusely heterogeneous without discrete or worrisome osseous lesion. No abnormal marrow edema or enhancement to suggest acute osteomyelitis discitis or septic arthritis. Conus medullaris and cauda equina: Conus extends to the T12 level. Conus and cauda equina appear normal. No epidural abscess or other collection. No abnormal enhancement. Paraspinal and other soft tissues: Necrotic retroperitoneal adenopathy with scattered soft tissue stranding noted, better characterized on recent CT. Associated intramuscular abscesses involving the bilateral psoas muscles again seen. Abscess at the anterior margin of the left psoas muscle measures 2.0 x 1.3 x 6.4 cm (series 13, image 25). Abscess involving the anterior aspect of the right psoas muscle measures 0.9 x 1.3 x 3.6 cm (series 13, image 23). These collections are close proximity to the adjacent adenopathy, likely reflecting extra capsular/extra nodal extension of infection with abscess formation. No MRI evidence for involvement of the underlying  vertebral column or spinal canal at this time. Remainder of the visualized paraspinous soft tissues otherwise within normal limits. Disc levels: L1-2:  Unremarkable. L2-3:  Unremarkable. L3-4: Negative interspace. Mild facet spurring. No canal or foraminal stenosis. L4-5: Negative interspace. Mild bilateral facet hypertrophy. No significant canal or foraminal stenosis. L5-S1: Transitional lumbosacral anatomy with rudimentary L5-S1 interspace. No disc bulge or focal disc herniation. No stenosis. IMPRESSION: 1. Necrotic retroperitoneal adenopathy with associated intramuscular abscesses  involving the bilateral psoas muscles as above, similar as compared to recent CT from 12/01/2021. No evidence for associated osteomyelitis discitis or septic arthritis within the adjacent lumbar spine. No epidural abscess or other collection. 2. Mild lower lumbar facet hypertrophy. No significant disc pathology or stenosis. Electronically Signed   By: Rise Mu M.D.   On: 12/02/2021 20:41    Scheduled Meds:  abacavir-dolutegravir-lamiVUDine  1 tablet Oral Daily   azithromycin  500 mg Oral Daily   B-complex with vitamin C  1 tablet Oral Daily   dronabinol  5 mg Oral TID AC   ethambutol  800 mg Oral Daily   feeding supplement  237 mL Oral QID   fostemsavir tromethamine  600 mg Oral BID   ketoconazole   Topical BID   metoCLOPramide (REGLAN) injection  5 mg Intravenous Q6H   metoprolol succinate  100 mg Oral q morning   mirtazapine  15 mg Oral QHS   mometasone-formoterol  2 puff Inhalation BID   nystatin  5 mL Mouth/Throat QID   pantoprazole  40 mg Oral QAC breakfast   QUEtiapine  200 mg Oral QHS   rifabutin  300 mg Oral Daily   rivaroxaban  20 mg Oral Q supper   rosuvastatin  5 mg Oral q morning   sodium chloride flush  3 mL Intravenous Q12H   sulfamethoxazole-trimethoprim  1 tablet Oral Daily   umeclidinium bromide  1 puff Inhalation Daily   Continuous Infusions:  sodium chloride 125 mL/hr at 12/04/21 1135   ampicillin-sulbactam (UNASYN) IV 3 g (12/04/21 0801)     LOS: 24 days   Hughie Closs, MD [facility]  12/04/2021, 1:22 PM   *Please note that this is a verbal dictation therefore any spelling or grammatical errors are due to the "Dragon [employer] One" system interpretation.  Please page via Amion and do not message via secure chat for urgent patient care matters. Secure chat can be used for non urgent patient care matters.  How to contact the [facility] Attending or Consulting provider 7A - 7P or covering provider during after hours 7P -7A, for this patient?  Check  the care team in [facility] and look for a) attending/consulting [facility] provider listed and b) the [facility] team listed. Page or secure chat 7A-7P. Log into [email or web address] and use [facility]'s universal password to access. If you do not have the password, please contact the hospital operator. Locate the [facility] provider you are looking for under [facility] and page to a number that you can be directly reached. If you still have difficulty reaching the provider, please page the [facility] (Director on Call) for the Hospitalists listed on amion for assistance.

## 2021-12-05 ENCOUNTER — Inpatient Hospital Stay (HOSPITAL_COMMUNITY): Payer: Medicaid Other

## 2021-12-05 DIAGNOSIS — R591 Generalized enlarged lymph nodes: Secondary | ICD-10-CM | POA: Diagnosis not present

## 2021-12-05 LAB — CBC WITH DIFFERENTIAL/PLATELET
Abs Immature Granulocytes: 0.11 10*3/uL — ABNORMAL HIGH (ref 0.00–0.07)
Basophils Absolute: 0 10*3/uL (ref 0.0–0.1)
Basophils Relative: 0 %
Eosinophils Absolute: 0.1 10*3/uL (ref 0.0–0.5)
Eosinophils Relative: 1 %
HCT: 25.6 % — ABNORMAL LOW (ref 39.0–52.0)
Hemoglobin: 7.8 g/dL — ABNORMAL LOW (ref 13.0–17.0)
Immature Granulocytes: 1 %
Lymphocytes Relative: 12 %
Lymphs Abs: 0.9 10*3/uL (ref 0.7–4.0)
MCH: 27.2 pg (ref 26.0–34.0)
MCHC: 30.5 g/dL (ref 30.0–36.0)
MCV: 89.2 fL (ref 80.0–100.0)
Monocytes Absolute: 0.7 10*3/uL (ref 0.1–1.0)
Monocytes Relative: 9 %
Neutro Abs: 6.1 10*3/uL (ref 1.7–7.7)
Neutrophils Relative %: 77 %
Platelets: 464 10*3/uL — ABNORMAL HIGH (ref 150–400)
RBC: 2.87 MIL/uL — ABNORMAL LOW (ref 4.22–5.81)
RDW: 15.9 % — ABNORMAL HIGH (ref 11.5–15.5)
WBC: 7.9 10*3/uL (ref 4.0–10.5)
nRBC: 0 % (ref 0.0–0.2)

## 2021-12-05 LAB — BASIC METABOLIC PANEL
Anion gap: 11 (ref 5–15)
BUN: <5 mg/dL — ABNORMAL LOW (ref 6–20)
CO2: 18 mmol/L — ABNORMAL LOW (ref 22–32)
Calcium: 9.2 mg/dL (ref 8.9–10.3)
Chloride: 109 mmol/L (ref 98–111)
Creatinine, Ser: 0.72 mg/dL (ref 0.61–1.24)
GFR, Estimated: >60 mL/min (ref >60)
Glucose, Bld: 90 mg/dL (ref 70–99)
Potassium: 3.7 mmol/L (ref 3.5–5.1)
Sodium: 138 mmol/L (ref 135–145)

## 2021-12-05 LAB — ACID FAST SMEAR (AFB, MYCOBACTERIA): Acid Fast Smear: NEGATIVE

## 2021-12-05 MED ORDER — BUPIVACAINE HCL (PF) 0.5 % IJ SOLN
INTRAMUSCULAR | Status: AC
  Administered 2021-12-05: 10 mL
  Filled 2021-12-05: qty 30

## 2021-12-05 NOTE — Progress Notes (Signed)
PT Cancellation Note  Patient Details Name: KATON MARALDO MRN: [id number] DOB: Nov 10, 1975   Cancelled Treatment:    Reason Eval/Treat Not Completed: Patient at procedure or test/unavailable (biopsy per RN)   Janan Halter Payson 12/05/2021, 1:06 PM Paulino Door, DPT Acute Rehabilitation Services Pager: [phone] Office: [phone]

## 2021-12-05 NOTE — Progress Notes (Signed)
PROGRESS NOTE    Kurt Foley  OZH:[id number] DOB: [birth year] DOA: 11/09/2021 PCP: Claiborne Rigg, NP   Brief Narrative:  46 year old with advanced AIDS, disseminated MAC substance abuse, depression, prior CVA with residual right-sided weakness, pulmonary embolism on Xarelto admitted for cough and palpitations.  Repeat CT showed abnormal lung parenchyma.  He was seen by pulmonary, cultures remain negative.  Underwent bronchoscopy with EBUS on 6/5 with subcarinal lymph node sampling and brushing with BAL of right upper lobe mass.  Brushings remain negative, IR consulted for CT-guided biopsy.  Patient off-and-on spiking fever, discussed with ID and this is likely due to MAC.  Patient has also been seen by palliative care service, wishes to be full code. Unforturnately continues to spike fevers daily leading to tachycardia.    ID is now changing antibiotics and trying IV Zosyn.  Continues to have temperatures and tachycardia along with some back pain but fever seems improved this morning and ID recommends continuing current antibiotics and regimen at this time.  Palliative care is increasing his blood but now and PT OT recommending outpatient PT.   Given his recurrent fevers ID is now checking a CT of the chest abdomen pelvis to see if there is anything new that they could consider; otherwise that they are going to continue his current plan however they have changed his HIV medication from McMullen to Triumeq.  He has not changed his morphine p.o. to oxycodone.  Given his poor appetite we will start him on D5W half-normal saline at 75 MLS per hour and also given 500 mill bolus given his slight hypotension.   CT Scan of the Chest/Abd/Pelvis done and showed "Worsening nodal disease since recent imaging from May in April in the chest and abdomen though improved when compared to January. Findings may reflect either sequela of age related infection or could potentially be related to  neoplasm/lymphoproliferative disorder. Would suggest correlation with PET imaging to select a site for sampling. This could also be utilized to assess pulmonary findings that have raise concern on recent imaging. More  masslike appearance of RIGHT upper lobe changes and LEFT upper lobe nodules as discussed above, consideration for  pulmonary neoplasm as discussed on recent imaging. Signs of pulmonary emphysema. Three-vessel coronary artery disease.  Low-attenuation cardiac blood pool compatible with anemia.  Stranding about jejunal loops in the upper abdomen is of uncertain significance but is associated with increasing nodal disease, perhaps related to inflammation or lymphatic congestion. Attention on follow-up.  Bilateral perinephric stranding which was not evident on most recent abdominal imaging. Correlation with urinalysis is suggested. Aortic Atherosclerosis (ICD10-I70.0) and Emphysema."   ID recommends continuing current plan of care transitioning to oral Augmentin for 5 days at discharge.  ID feels that they do not expect any meaningful recovery but patient wishes to continue his current treatments and wants to consider physical rehabilitation.  We will consult PT OT for further evaluation recommendations and they are recommending outpatient PT and OT.  [name] infectious diseases reached out to me to see if we can get a GI consult for this patient given his nausea vomiting and difficulty with his food.  GI evaluated and feels that multiple medications can account for his nausea vomiting but he does not think this is a primary GI process and feels that the patient's main concern is singultus.  GI has recommended hospice but is declined.  GI feels there is no need for endoscopy currently but if there is concern for infectious esophagitis empiric  treatment such as fluconazole to be considered we will defer this to infectious diseases.  We will place the patient on scheduled antiemetics and Dr. Dulce Sellar spoke  with Dr. Luciana Axe.   Nutrition recommending changing Marinol to 8 AM and 3 PM daily for peak effectiveness.  Is unclear how much she is actually ingesting and taking in for his food and oral intake.  Calorie count has been initiated and ID has started the patient on micafungin given that he has reported dysphagia and cannot essentially rule out thrush; his CD4 count is improving.  Palliative is involved for further goals of care discussion     Assessment & Plan:   Principal Problem:   Lymphadenopathy Active Problems:   Mass of upper lobe of right lung   Human immunodeficiency virus (HIV) disease (HCC)   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HCC)   Emphysema, unspecified (HCC)   Abnormal LFTs   History of CVA with residual right hemiparesis   Pulmonary embolism (HCC)   Severe recurrent major depression without psychotic features (HCC)   Tachycardia   Insomnia   SIRS (systemic inflammatory response syndrome) (HCC)   Community acquired pneumonia of right lung   Mediastinal lymphadenopathy   Goals of care, counseling/discussion   Weakness   Encounter for palliative care   Oropharyngeal dysphagia   Protein-calorie malnutrition, severe  SIRS with recurrent fevers History of disseminated MAC infection - Seen by ID.  This is likely secondary to disseminated MAC history.  Current regimen-Biktarvy, azithromycin and ethambutol. Now on Rifabutin as well.  -ID feels that he has no real source of his fever but could be secondary to his disseminated MRSA infection -ID recommended GI evaluation given inability to keep food down and nausea and vomiting but GI evaluated and feels that he has no evidence of this and the patient's main issue is singultus.  Dr. Modena Nunnery does not feel that his nausea vomiting is a primary GI process and recommending symptomatic treatment and scheduling antiemetics as well as possible treatment with fluconazole if concern for infectious esophagitis -GI signed off  the case and has recommended hospice but patient has declined; palliative continues to have goals of care discussion; ID started the patient on Mycobutin. ID discontinued antibiotics and decided to watch off antibiotics.  However, patient has been having persistent fever since 11/30/2021.  CT chest abdomen ordered by ID on 12/01/2021 shows extensive necrotic appearing lymph nodes in the chest and abdomen with right upper lobe lesion as well as psoas abscesses that seem associated with necrotic adenopathy in the retroperitoneum.  ID has reinitiated patient on Zosyn.  IR has been consulted and patient is scheduled to have biopsy of 1 of those lymph nodes.  IR also drained his iliopsoas abscess, cultures pending.  Antibiotics per ID.    Right upper lobe mass: Dyspnea with shortness of breath  -Status post bronchoscopy with EBUS on 6/5.  Brushings remain negative.  Case discussed with IR who recommends outptn PET scan. Spoke with Dr Delton Coombes from [facility], who also reviewed images and recommended outptn PET followed by follow up with Dr Tonia Brooms for discussion of possible Navigation Bronch. There may be a component of infection which may clear with time?   AKI/metabolic acidosis: Both had resolved initially.  creatinine jumped back to 1.5 on 11/30/2021, IV fluids started, AKI resolved.  Will discontinue IV fluids now.  History of PE and DVT: Continue Xarelto, no recommendations to hold Xarelto from IR.  History of CVA -Residual right-sided weakness with right hand  and arm contractures.  On statin.  PT OT on board.  Emphysema -Bronchodilators to be continued.  Substance use -Counseled to quit using.  Depression -Continue with on Seroquel and Remeron   Hyponatremia: Resolved.   Normocytic Anemia: Stable.  GERD -Daily PPI with Pantoprazole 40 mg po Daily   Elevated LFTs: Improved.  Nausea/poor p.o. intake: Symptoms improving.  Continue Reglan.   Goals of care Severe protein calorie  malnutrition -Advanced HIV with very guarded prognosis.  -Palliative care following-he wishes to be full code -Supplements.  Marinol added.  Appreciate palliative care help.    DVT prophylaxis:    Code Status: Full Code  Family Communication:  None present at bedside.  I was finally able to speak with his aunt Kurt Foley per his request and answered questions.  Status is: Inpatient Remains inpatient appropriate because: Patient with persistent fevers.  Estimated body mass index is 23.31 kg/m as calculated from the following:   Height as of this encounter: 5\' 6"  (1.676 m).   Weight as of this encounter: 65.5 kg.    Nutritional Assessment: Body mass index is 23.31 kg/m.Marland Kitchen Seen by dietician.  I agree with the assessment and plan as outlined below: Nutrition Status: Nutrition Problem: Severe Malnutrition Etiology: acute illness Signs/Symptoms: energy intake < or equal to 50% for > or equal to 5 days, percent weight loss Interventions: Refer to RD note for recommendations, Ensure Enlive (each supplement provides 350kcal and 20 grams of protein)  . Skin Assessment: I have examined the patient's skin and I agree with the wound assessment as performed by the wound care RN as outlined below:    Consultants:  ID Palliative care  Procedures:  As above  Antimicrobials:  Anti-infectives (From admission, onward)    Start     Dose/Rate Route Frequency Ordered Stop   12/02/21 1415  Ampicillin-Sulbactam (UNASYN) 3 g in sodium chloride 0.9 % 100 mL IVPB        3 g 200 mL/hr over 30 Minutes Intravenous Every 6 hours 12/02/21 1327     11/30/21 0000  micafungin (MYCAMINE) 150 mg in sodium chloride 0.9 % 100 mL IVPB  Status:  Discontinued        150 mg 107.5 mL/hr over 1 Hours Intravenous Every 24 hours 11/29/21 1340 11/30/21 0756   11/29/21 0000  micafungin (MYCAMINE) 50 mg in sodium chloride 0.9 % 100 mL IVPB  Status:  Discontinued        50 mg 102.5 mL/hr over 1 Hours Intravenous Every 24  hours 11/28/21 1939 11/29/21 1340   11/28/21 2200  fostemsavir tromethamine (RUKOBIA) ER tablet 600 mg        600 mg Oral 2 times daily 11/28/21 1944     11/24/21 1000  abacavir-dolutegravir-lamiVUDine (TRIUMEQ) 600-50-300 MG per tablet 1 tablet        1 tablet Oral Daily 11/23/21 1107     11/23/21 1130  piperacillin-tazobactam (ZOSYN) IVPB 3.375 g  Status:  Discontinued        3.375 g 12.5 mL/hr over 240 Minutes Intravenous Every 8 hours 11/23/21 1042 11/28/21 1937   11/20/21 1500  rifabutin (MYCOBUTIN) capsule 300 mg        300 mg Oral Daily 11/20/21 1341     11/20/21 1430  sulfamethoxazole-trimethoprim (BACTRIM DS) 800-160 MG per tablet 1 tablet        1 tablet Oral Daily 11/20/21 1342     11/17/21 0800  vancomycin (VANCOCIN) IVPB 1000 mg/200 mL premix  Status:  Discontinued  1,000 mg 200 mL/hr over 60 Minutes Intravenous Every 12 hours 11/16/21 1807 11/17/21 1048   11/16/21 1830  vancomycin (VANCOREADY) IVPB 1500 mg/300 mL        1,500 mg 150 mL/hr over 120 Minutes Intravenous STAT 11/16/21 1758 11/16/21 2048   11/16/21 1800  ceFEPIme (MAXIPIME) 2 g in sodium chloride 0.9 % 100 mL IVPB  Status:  Discontinued        2 g 200 mL/hr over 30 Minutes Intravenous Every 8 hours 11/16/21 1756 11/17/21 1048   11/11/21 2200  azithromycin (ZITHROMAX) tablet 500 mg        500 mg Oral Daily 11/11/21 1511     11/10/21 2200  azithromycin (ZITHROMAX) 500 mg in sodium chloride 0.9 % 250 mL IVPB  Status:  Discontinued        500 mg 250 mL/hr over 60 Minutes Intravenous Every 24 hours 11/10/21 1316 11/11/21 1511   11/10/21 1400  cefTRIAXone (ROCEPHIN) 2 g in sodium chloride 0.9 % 100 mL IVPB  Status:  Discontinued        2 g 200 mL/hr over 30 Minutes Intravenous Every 24 hours 11/10/21 1316 11/11/21 1511   11/10/21 1000  ethambutol (MYAMBUTOL) tablet 800 mg        800 mg Oral Daily 11/09/21 1840     11/10/21 1000  sulfamethoxazole-trimethoprim (BACTRIM DS) 800-160 MG per tablet 1 tablet  Status:   Discontinued        1 tablet Oral Daily 11/09/21 1840 11/10/21 1316   11/09/21 2200  azithromycin (ZITHROMAX) tablet 500 mg  Status:  Discontinued        500 mg Oral Daily at bedtime 11/09/21 1909 11/10/21 1316   11/09/21 1830  vancomycin (VANCOREADY) IVPB 1500 mg/300 mL        1,500 mg 150 mL/hr over 120 Minutes Intravenous STAT 11/09/21 1817 11/09/21 2157   11/09/21 1830  piperacillin-tazobactam (ZOSYN) IVPB 3.375 g        3.375 g 100 mL/hr over 30 Minutes Intravenous STAT 11/09/21 1817 11/09/21 1932   11/09/21 1700  bictegravir-emtricitabine-tenofovir AF (BIKTARVY) 50-200-25 MG per tablet 1 tablet  Status:  Discontinued        1 tablet Oral Daily 11/09/21 1608 11/23/21 1107         Subjective:  Patient seen and examined.  He has no complaints.  Objective: Vitals:   12/05/21 0341 12/05/21 0840 12/05/21 0928 12/05/21 1000  BP: 106/82 96/72  105/74  Pulse: (!) 105 (!) 120  (!) 117  Resp: (!) 22 20  18   Temp: 98.4 F (36.9 C) 100 F (37.8 C)  98.5 F (36.9 C)  TempSrc: Oral   Oral  SpO2: 96% 96% 96% 96%  Weight:      Height:        Intake/Output Summary (Last 24 hours) at 12/05/2021 1305 Last data filed at 12/05/2021 1149 Gross per 24 hour  Intake 1077 ml  Output 800 ml  Net 277 ml    Filed Weights   11/09/21 2040 11/18/21 0430 11/28/21 1624  Weight: 67.9 kg 69.2 kg 65.5 kg    Examination:  General exam: Appears calm and comfortable, cachectic and chronically sick looking. Respiratory system: Clear to auscultation. Respiratory effort normal. Cardiovascular system: S1 & S2 heard, RRR. No JVD, murmurs, rubs, gallops or clicks. No pedal edema. Gastrointestinal system: Abdomen is nondistended, soft and nontender. No organomegaly or masses felt. Normal bowel sounds heard. Central nervous system: Alert and oriented. No focal neurological deficits. Extremities:  Symmetric 5 x 5 power. Skin: No rashes, lesions or ulcers.  Psychiatry: Judgement and insight appear  poor  Data Reviewed: I have personally reviewed following labs and imaging studies  CBC: Recent Labs  Lab 11/29/21 0443 11/30/21 0557 12/03/21 1035 12/05/21 0502  WBC 6.6 15.0* 8.4 7.9  NEUTROABS 5.1 12.1* 6.8 6.1  HGB 8.8* 8.3* 8.0* 7.8*  HCT 28.7* 26.8* 27.0* 25.6*  MCV 90.5 92.4 91.2 89.2  PLT 497* 453* 481* 464*    Basic Metabolic Panel: Recent Labs  Lab 11/29/21 0443 11/30/21 0557 12/01/21 0456 12/03/21 1036 12/05/21 0502  NA 135 136 136 135 138  K 4.3 4.9 4.9 3.7 3.7  CL 106 104 107 110 109  CO2 18* 25 19* 16* 18*  GLUCOSE 90 92 94 124* 90  BUN 11 23* 11 5* <5*  CREATININE 0.98 1.50* 0.96 0.69 0.72  CALCIUM 10.3 11.1* 10.2 9.1 9.2  MG 2.0 2.1  --   --   --   PHOS 3.9 3.4  --   --   --     GFR: Estimated Creatinine Clearance: 103 mL/min (by C-G formula based on SCr of 0.72 mg/dL). Liver Function Tests: Recent Labs  Lab 11/29/21 0443 11/30/21 0557 12/01/21 0456  AST 51* 29 66*  ALT 43 13 50*  ALKPHOS 276* 286* 330*  BILITOT 0.9 0.6 0.6  PROT 7.8 6.9 8.0  ALBUMIN 2.8* 2.4* 2.8*    No results for input(s): "LIPASE", "AMYLASE" in the last 168 hours. No results for input(s): "AMMONIA" in the last 168 hours. Coagulation Profile: No results for input(s): "INR", "PROTIME" in the last 168 hours. Cardiac Enzymes: No results for input(s): "CKTOTAL", "CKMB", "CKMBINDEX", "TROPONINI" in the last 168 hours. BNP (last 3 results) No results for input(s): "PROBNP" in the last 8760 hours. HbA1C: No results for input(s): "HGBA1C" in the last 72 hours. CBG: No results for input(s): "GLUCAP" in the last 168 hours.  Lipid Profile: No results for input(s): "CHOL", "HDL", "LDLCALC", "TRIG", "CHOLHDL", "LDLDIRECT" in the last 72 hours. Thyroid Function Tests: No results for input(s): "TSH", "T4TOTAL", "FREET4", "T3FREE", "THYROIDAB" in the last 72 hours. Anemia Panel: No results for input(s): "VITAMINB12", "FOLATE", "FERRITIN", "TIBC", "IRON", "RETICCTPCT" in the  last 72 hours. Sepsis Labs: No results for input(s): "PROCALCITON", "LATICACIDVEN" in the last 168 hours.  Recent Results (from the past 240 hour(s))  Aerobic/Anaerobic Culture w Gram Stain (surgical/deep wound)     Status: None (Preliminary result)   Collection Time: 12/03/21 12:19 PM   Specimen: Abscess  Result Value Ref Range Status   Specimen Description   Final    ABSCESS Performed at [facility], [address] [place], [place] [address]    Special Requests   Final    Immunocompromised Performed at [facility], [address], [place], [place] [address]    Gram Stain   Final    ABUNDANT WBC PRESENT,BOTH PMN AND MONONUCLEAR NO ORGANISMS SEEN    Culture   Final    NO GROWTH 2 DAYS NO ANAEROBES ISOLATED; CULTURE IN PROGRESS FOR 5 DAYS Performed at [facility], [address] [place], [place] [address]    Report Status PENDING  Incomplete  Culture, fungus without smear     Status: None (Preliminary result)   Collection Time: 12/03/21  5:39 PM   Specimen: Abscess; Other  Result Value Ref Range Status   Specimen Description   Final    ABSCESS Performed at [facility], [address], [place], [place] [address]    Special Requests   Final    Immunocompromised Performed at [facility], [address] [place], [place] [address]    Culture   Final    NO FUNGUS ISOLATED AFTER 1 DAY Performed at [facility], [address] [place], [place] [address]    Report Status PENDING  Incomplete      Radiology Studies: No results found.  Scheduled Meds:  abacavir-dolutegravir-lamiVUDine  1 tablet Oral Daily   azithromycin  500 mg Oral Daily   B-complex with vitamin C  1 tablet Oral Daily   bupivacaine(PF)       dronabinol  5 mg Oral TID AC   ethambutol  800 mg Oral Daily   feeding supplement  237 mL Oral QID   fostemsavir tromethamine  600 mg Oral BID   ketoconazole   Topical  BID   metoCLOPramide (REGLAN) injection  5 mg Intravenous Q6H   metoprolol succinate  100 mg Oral q morning   mirtazapine  15 mg Oral QHS   mometasone-formoterol  2 puff Inhalation BID   nystatin  5 mL Mouth/Throat QID   pantoprazole  40 mg Oral QAC breakfast   QUEtiapine  200 mg Oral QHS   rifabutin  300 mg Oral Daily   rivaroxaban  20 mg Oral Q supper   rosuvastatin  5 mg Oral q morning   sodium chloride flush  3 mL Intravenous Q12H   sulfamethoxazole-trimethoprim  1 tablet Oral Daily   umeclidinium bromide  1 puff Inhalation Daily   Continuous Infusions:  ampicillin-sulbactam (UNASYN) IV 3 g (12/05/21 1109)     LOS: 25 days   Hughie Closs, MD [facility]  12/05/2021, 1:05 PM   *Please note that this is a verbal dictation therefore any spelling or grammatical errors are due to the "[employer]agon [employer] One" system interpretation.  Please page via Amion and do not message via secure chat for urgent patient care matters. Secure chat can be used for non urgent patient care matters.  How to contact the [facility] Attending or Consulting provider 7A - 7P or covering provider during after hours 7P -7A, for this patient?  Check the care team in [facility] and look for a) attending/consulting [facility] provider listed and b) the [facility] team listed. Page or secure chat 7A-7P. Log into [email or web address] and use [facility]'s universal password to access. If you do not have the password, please contact the hospital operator. Locate the [facility] provider you are looking for under [facility] and page to a number that you can be directly reached. If you still have difficulty reaching the provider, please page the [facility] (Director on Call) for the Hospitalists listed on amion for assistance.

## 2021-12-06 DIAGNOSIS — R591 Generalized enlarged lymph nodes: Secondary | ICD-10-CM | POA: Diagnosis not present

## 2021-12-06 LAB — CYTOLOGY - NON PAP

## 2021-12-06 NOTE — Progress Notes (Addendum)
[facility]  Date of Admission:  11/09/2021           Reason for visit: Follow up on HIV   ASSESSMENT:    46 y.o. male admitted with:  # Fevers # Lymphadenopathy, upper lobe lung mass, psoas abscess - Tmax 100.1 - Fever curve appears improved following resumption of Unasyn on 6/23 - ? Bacterial superinfection in the chest/abd/pelvis - ? IRIS related to MAC - ? 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  Suspect IRIS was contributory to this pathology   RECOMMENDATIONS:    Continue Triumeq and Rukobia Continue MAC therapy with azithromycin, rifabutin, and ethambutol Continue Unasyn Follow IR cultures (bacterial, AFB, fungal).  No specimens sent for pathology Will follow     Principal Problem:   Lymphadenopathy Active Problems:   Human immunodeficiency virus (HIV) disease (HCC)   Severe recurrent major depression without psychotic features (HCC)   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HCC)   Emphysema, unspecified (HCC)   History of CVA with residual right hemiparesis   Tachycardia   Insomnia   Abnormal LFTs   Mass of upper lobe of right lung   Pulmonary embolism (HCC)   SIRS (systemic inflammatory response syndrome) (HCC)   Community acquired pneumonia of right lung   Mediastinal lymphadenopathy   Goals of care, counseling/discussion   Weakness   Encounter for palliative care   Oropharyngeal dysphagia   Protein-calorie malnutrition, severe    MEDICATIONS:    Scheduled Meds: . abacavir-dolutegravir-lamiVUDine  1 tablet Oral Daily  . azithromycin  500 mg Oral Daily  . B-complex with vitamin C  1 tablet Oral Daily  . dronabinol  5 mg Oral TID AC  . ethambutol  800 mg Oral Daily  . feeding  supplement  237 mL Oral QID  . fostemsavir tromethamine  600 mg Oral BID  . ketoconazole   Topical BID  . metoCLOPramide (REGLAN) injection  5 mg Intravenous Q6H  . metoprolol succinate  100 mg Oral q morning  . mirtazapine  15 mg Oral QHS  . mometasone-formoterol  2 puff Inhalation BID  . nystatin  5 mL Mouth/Throat QID  . pantoprazole  40 mg Oral QAC breakfast  . QUEtiapine  200 mg Oral QHS  . rifabutin  300 mg Oral Daily  . rivaroxaban  20 mg Oral Q supper  . rosuvastatin  5 mg Oral q morning  . sodium chloride flush  3 mL Intravenous Q12H  . sulfamethoxazole-trimethoprim  1 tablet Oral Daily  . umeclidinium bromide  1 puff Inhalation Daily   Continuous Infusions: . ampicillin-sulbactam (UNASYN) IV Stopped (12/06/21 0455)   PRN Meds:.acetaminophen (TYLENOL) oral liquid 160 mg/5 mL, acetaminophen **OR** acetaminophen, baclofen, chlorproMAZINE, guaiFENesin-dextromethorphan, hydrALAZINE, ibuprofen, ipratropium-albuterol, metoprolol tartrate, morphine injection, mouth rinse, oxyCODONE, polyethylene glycol, senna-docusate  SUBJECTIVE:   24 hour events:  Tmax 100.1 S/p IR procedure yesterday  No new complaints    Review of Systems  Constitutional: Negative.   Respiratory: Negative.    Cardiovascular: Negative.   Gastrointestinal: Negative.       OBJECTIVE:   Blood pressure 95/64, pulse (!) 118, temperature 97.6 F (36.4 C), temperature source Oral, resp. rate 20, height 5\' 6"  (1.676 m), weight 65.5 kg, SpO2 92 %. Body mass index  is 23.31 kg/m.  Physical Exam Constitutional:      Comments: Fatigued and ill appearing, lying in bed, no distress.   Eyes:     Extraocular Movements: Extraocular movements intact.     Conjunctiva/sclera: Conjunctivae normal.  Pulmonary:     Effort: Pulmonary effort is normal. No respiratory distress.  Musculoskeletal:     Cervical back: Normal range of motion and neck supple.  Skin:    General: Skin is warm and dry.     Findings: No  rash.  Neurological:     General: No focal deficit present.     Mental Status: Mental status is at baseline.  Psychiatric:        Mood and Affect: Mood normal.        Behavior: Behavior normal.      Lab Results: Lab Results  Component Value Date   WBC 7.9 12/05/2021   HGB 7.8 (L) 12/05/2021   HCT 25.6 (L) 12/05/2021   MCV 89.2 12/05/2021   PLT 464 (H) 12/05/2021    Lab Results  Component Value Date   NA 138 12/05/2021   K 3.7 12/05/2021   CO2 18 (L) 12/05/2021   GLUCOSE 90 12/05/2021   BUN <5 (L) 12/05/2021   CREATININE 0.72 12/05/2021   CALCIUM 9.2 12/05/2021   GFRNONAA >60 12/05/2021   GFRAA 127 08/06/2019    Lab Results  Component Value Date   ALT 50 (H) 12/01/2021   AST 66 (H) 12/01/2021   ALKPHOS 330 (H) 12/01/2021   BILITOT 0.6 12/01/2021    No results found for: "CRP"  No results found for: "ESRSEDRATE"   I have reviewed the micro and lab results in Epic.  Imaging: [place] FNA SOFT TISSUE  Result Date: 12/05/2021 INDICATION: 46 year old with history of HIV and scattered lymphadenopathy and abscesses. Patient has a complex cystic lesion or abscess in the lower midline of the neck just below the thyroid tissue. EXAM: Ultrasound-guided aspiration of lower neck collection MEDICATIONS: 1% lidocaine, local anesthetic ANESTHESIA/SEDATION: None COMPLICATIONS: None immediate. PROCEDURE: Informed written consent was obtained from the patient after a thorough discussion of the procedural risks, benefits and alternatives. All questions were addressed. A timeout was performed prior to the initiation of the procedure. The patient's lower neck was evaluated with ultrasound. Poorly defined hypoechoic area just below the thyroid tissue was identified. The skin was prepped with chlorhexidine and sterile field was created. Skin was anesthetized using 1% lidocaine. Using ultrasound guidance, a 25 gauge needle was directed into this lesion for cytology and a small amount of purulent  fluid was aspirated. Using ultrasound guidance, a 22 gauge needle was then directed into the collection and additional purulent fluid was aspirated. Subsequently, an 18 gauge spinal needle was directed into the collection and 3 mL of white purulent/necrotic fluid was obtained. Fluid was collected for cytology and culture. Bandage placed over the puncture site. FINDINGS: Poorly defined hypoechoic lesion or collection in the lower midline just below the thyroid tissue. Needle position confirmed within the lesion. Approximately 3 mL of white necrotic or purulent fluid was aspirated from this collection. IMPRESSION: Ultrasound-guided aspiration of the necrotic lesion or abscess in the lower neck. Electronically Signed   By: Richarda Overlie M.D.   On: 12/05/2021 17:40     Imaging independently reviewed in Epic.    [facility] Coffer for Infectious Disease [facility] [phone] pager 12/06/2021, 8:40 AM  I have personally spent 50 minutes involved in face-to-face and non-face-to-face activities  for this patient on the day of the visit. Professional time spent includes the following activities: Preparing to see the patient (review of tests), Obtaining and/or reviewing separately obtained history (admission/discharge record), Performing a medically appropriate examination and/or evaluation , Ordering medications/tests/procedures, referring and communicating with other health care professionals, Documenting clinical information in the EMR, Independently interpreting results (not separately reported), Communicating results to the patient/family/caregiver, Counseling and educating the patient/family/caregiver and Care coordination (not separately reported).

## 2021-12-06 NOTE — Progress Notes (Signed)
Physical Therapy Treatment Patient Details Name: Kurt Foley MRN: [id number] DOB: 11/07/1975 Today's Date: 12/06/2021   History of Present Illness 46 yo male admitted with SIRS. [name] hx of PE/DVT. Hx of CVA 12/22, 1/23 with R residual weakness, HIV/AIDS, R lung mass, depression, drug/ETOH use.Underwent bronchoscopy with EBUS on 6/5 with subcarinal lymph node sampling and brushing with BAL of right upper lobe mass.    PT Comments    Pt ambulated 57' + 26' with hemiwalker, with seated rest break, distance limited by fatigue, HR 128 while walking. Overall ambulation distance has declined over the past couple weeks, pt is fatiguing more quickly.   Recommendations for follow up therapy are one component of a multi-disciplinary discharge planning process, led by the attending physician.  Recommendations may be updated based on patient status, additional functional criteria and insurance authorization.  Follow Up Recommendations  Outpatient PT     Assistance Recommended at Discharge PRN  Patient can return home with the following A little help with walking and/or transfers;Help with stairs or ramp for entrance;A little help with bathing/dressing/bathroom;Assistance with cooking/housework   Equipment Recommendations  None recommended by PT    Recommendations for Other Services       Precautions / Restrictions Precautions Precautions: Fall Precaution Comments: R residual weakness UE/LE; R foot AFO Restrictions Weight Bearing Restrictions: No     Mobility  Bed Mobility               General bed mobility comments: up in recliner    Transfers Overall transfer level: Needs assistance Equipment used: Hemi-walker Transfers: Sit to/from Stand Sit to Stand: Min assist           General transfer comment: assist to power up    Ambulation/Gait Ambulation/Gait assistance: Min guard Gait Distance (Feet): 38 Feet Assistive device: Hemi-walker Gait Pattern/deviations:  Decreased stride length, Step-to pattern, Decreased weight shift to right Gait velocity: decreased     General Gait Details: 37' + 26' with seated rest break, distance limited by fatigue, HR 128 walking, no loss of balance   Stairs             Wheelchair Mobility    Modified Rankin (Stroke Patients Only)       Balance Overall balance assessment: Mild deficits observed, not formally tested                                          Cognition Arousal/Alertness: Awake/alert Behavior During Therapy: WFL for tasks assessed/performed Overall Cognitive Status: Within Functional Limits for tasks assessed                                 General Comments: AxO x 3 very pleasant        Exercises      General Comments        Pertinent Vitals/Pain Pain Assessment Pain Assessment: No/denies pain Pain Intervention(s): Premedicated before session    Home Living                          Prior Function            PT Goals (current goals can now be found in the care plan section) Acute Rehab PT Goals Patient Stated Goal: to be able to mobilize more, return to  outpatient PT PT Goal Formulation: With patient Time For Goal Achievement: 12/12/21 Potential to Achieve Goals: Good Progress towards PT goals: Progressing toward goals    Frequency    Min 3X/week      PT Plan Current plan remains appropriate    Co-evaluation              AM-PAC PT "6 Clicks" Mobility   Outcome Measure  Help needed turning from your back to your side while in a flat bed without using bedrails?: A Little Help needed moving from lying on your back to sitting on the side of a flat bed without using bedrails?: A Little Help needed moving to and from a bed to a chair (including a wheelchair)?: A Little Help needed standing up from a chair using your arms (e.g., wheelchair or bedside chair)?: A Little Help needed to walk in hospital room?: A  Little Help needed climbing 3-5 steps with a railing? : A Lot 6 Click Score: 17    End of Session Equipment Utilized During Treatment: Gait belt Activity Tolerance: Patient tolerated treatment well;Patient limited by fatigue Patient left: in chair;with call bell/phone within reach;with chair alarm set Nurse Communication: Mobility status PT Visit Diagnosis: Difficulty in walking, not elsewhere classified (R26.2);Other symptoms and signs involving the nervous system (R29.898)     Time: 1610-9604 PT Time Calculation (min) (ACUTE ONLY): 20 min  Charges:  $Gait Training: 8-22 mins                    Ralene Bathe Kistler PT 12/06/2021  Acute Rehabilitation Services  Office [phone]

## 2021-12-06 NOTE — Progress Notes (Signed)
PROGRESS NOTE    Kurt Foley  UYQ:[id number] DOB: 1975/10/09 DOA: 11/09/2021 PCP: Claiborne Rigg, NP   Brief Narrative:  46 year old with advanced AIDS, disseminated MAC substance abuse, depression, prior CVA with residual right-sided weakness, pulmonary embolism on Xarelto admitted for cough and palpitations.  Repeat CT showed abnormal lung parenchyma.  He was seen by pulmonary, cultures remain negative.  Underwent bronchoscopy with EBUS on 6/5 with subcarinal lymph node sampling and brushing with BAL of right upper lobe mass.  Brushings remain negative, IR consulted for CT-guided biopsy.  Patient off-and-on spiking fever, discussed with ID and this is likely due to MAC.  Patient has also been seen by palliative care service, wishes to be full code. Unforturnately continues to spike fevers daily leading to tachycardia.    ID is now changing antibiotics and trying IV Zosyn.  Continues to have temperatures and tachycardia along with some back pain but fever seems improved this morning and ID recommends continuing current antibiotics and regimen at this time.  Palliative care is increasing his blood but now and PT OT recommending outpatient PT.   Given his recurrent fevers ID is now checking a CT of the chest abdomen pelvis to see if there is anything new that they could consider; otherwise that they are going to continue his current plan however they have changed his HIV medication from [place] to Triumeq.  He has not changed his morphine p.o. to oxycodone.  Given his poor appetite we will start him on D5W half-normal saline at 75 MLS per hour and also given 500 mill bolus given his slight hypotension.   CT Scan of the Chest/Abd/Pelvis done and showed "Worsening nodal disease since recent imaging from May in April in the chest and abdomen though improved when compared to January. Findings may reflect either sequela of age related infection or could potentially be related to  neoplasm/lymphoproliferative disorder. Would suggest correlation with PET imaging to select a site for sampling. This could also be utilized to assess pulmonary findings that have raise concern on recent imaging. More  masslike appearance of RIGHT upper lobe changes and LEFT upper lobe nodules as discussed above, consideration for  pulmonary neoplasm as discussed on recent imaging. Signs of pulmonary emphysema. Three-vessel coronary artery disease.  Low-attenuation cardiac blood pool compatible with anemia.  Stranding about jejunal loops in the upper abdomen is of uncertain significance but is associated with increasing nodal disease, perhaps related to inflammation or lymphatic congestion. Attention on follow-up.  Bilateral perinephric stranding which was not evident on most recent abdominal imaging. Correlation with urinalysis is suggested. Aortic Atherosclerosis (ICD10-I70.0) and Emphysema."   ID recommends continuing current plan of care transitioning to oral Augmentin for 5 days at discharge.  ID feels that they do not expect any meaningful recovery but patient wishes to continue his current treatments and wants to consider physical rehabilitation.  We will consult PT OT for further evaluation recommendations and they are recommending outpatient PT and OT.  [name] infectious diseases reached out to me to see if we can get a GI consult for this patient given his nausea vomiting and difficulty with his food.  GI evaluated and feels that multiple medications can account for his nausea vomiting but he does not think this is a primary GI process and feels that the patient's main concern is singultus.  GI has recommended hospice but is declined.  GI feels there is no need for endoscopy currently but if there is concern for infectious esophagitis empiric  treatment such as fluconazole to be considered we will defer this to infectious diseases.  We will place the patient on scheduled antiemetics and Dr. Dulce Sellar spoke  with Dr. Luciana Axe.   Nutrition recommending changing Marinol to 8 AM and 3 PM daily for peak effectiveness.  Is unclear how much she is actually ingesting and taking in for his food and oral intake.  Calorie count has been initiated and ID has started the patient on micafungin given that he has reported dysphagia and cannot essentially rule out thrush; his CD4 count is improving.  Palliative is involved for further goals of care discussion     Assessment & Plan:   Principal Problem:   Lymphadenopathy Active Problems:   Mass of upper lobe of right lung   Human immunodeficiency virus (HIV) disease (HCC)   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HCC)   Emphysema, unspecified (HCC)   Abnormal LFTs   History of CVA with residual right hemiparesis   Pulmonary embolism (HCC)   Severe recurrent major depression without psychotic features (HCC)   Tachycardia   Insomnia   SIRS (systemic inflammatory response syndrome) (HCC)   Community acquired pneumonia of right lung   Mediastinal lymphadenopathy   Goals of care, counseling/discussion   Weakness   Encounter for palliative care   Oropharyngeal dysphagia   Protein-calorie malnutrition, severe  SIRS with recurrent fevers History of disseminated MAC infection - Seen by ID.  This is likely secondary to disseminated MAC history.  Current regimen-Biktarvy, azithromycin and ethambutol. Now on Rifabutin as well.  -ID feels that he has no real source of his fever but could be secondary to his disseminated MRSA infection -ID recommended GI evaluation given inability to keep food down and nausea and vomiting but GI evaluated and feels that he has no evidence of this and the patient's main issue is singultus.  Dr. Modena Nunnery does not feel that his nausea vomiting is a primary GI process and recommending symptomatic treatment and scheduling antiemetics as well as possible treatment with fluconazole if concern for infectious esophagitis -GI signed off  the case and has recommended hospice but patient has declined; palliative continues to have goals of care discussion; ID started the patient on Mycobutin. ID discontinued antibiotics and decided to watch off antibiotics.  However, patient started having persistent fever since 11/30/2021.  CT chest abdomen ordered by ID on 12/01/2021 shows extensive necrotic appearing lymph nodes in the chest and abdomen with right upper lobe lesion as well as psoas abscesses that seem associated with necrotic adenopathy in the retroperitoneum.  ID has reinitiated patient on Zosyn.  IR has been consulted and patient is scheduled to have biopsy of 1 of those lymph nodes.  IR also drained his iliopsoas abscess on 12/03/2021 and then fine-needle aspiration of the neck abscess on 12/05/2021.  Cultures are still pending.  Antibiotics per ID.  Patient's last fever spike of 100.7 was 8 AM on 12/04/2021.  Patient eager to go home.  Waiting for ID to formulate antibiotic plan and clear the patient for discharge.  Right upper lobe mass: Dyspnea with shortness of breath  -Status post bronchoscopy with EBUS on 6/5.  Brushings remain negative.  Case discussed with IR who recommends outptn PET scan. Spoke with Dr Delton Coombes from [facility], who also reviewed images and recommended outptn PET followed by follow up with Dr Tonia Brooms for discussion of possible Navigation Bronch. There may be a component of infection which may clear with time?   AKI/metabolic acidosis: Both had resolved initially.  creatinine jumped back to 1.5 on 11/30/2021, IV fluids started, AKI resolved.  Will discontinue IV fluids now.  History of PE and DVT: Continue Xarelto.   History of CVA -Residual right-sided weakness with right hand and arm contractures.  On statin.  PT OT on board.  Emphysema -Bronchodilators to be continued.  Substance use -Counseled to quit using.  Depression -Continue with on Seroquel and Remeron   Hyponatremia: Resolved.   Normocytic Anemia:  Stable.  GERD -Daily PPI with Pantoprazole 40 mg po Daily   Elevated LFTs: Improved.  Nausea/poor p.o. intake: Symptoms improving.  Continue Reglan.   Goals of care Severe protein calorie malnutrition -Advanced HIV with very guarded prognosis.  -Palliative care following-he wishes to be full code -Supplements.  Marinol added.  Appreciate palliative care help.    DVT prophylaxis:    Code Status: Full Code  Family Communication:  None present at bedside.  I was finally able to speak with his aunt French Ana per his request yesterday.  Status is: Inpatient Remains inpatient appropriate because: Pending final culture report and recommendations by ID.  Estimated body mass index is 23.31 kg/m as calculated from the following:   Height as of this encounter: 5\' 6"  (1.676 m).   Weight as of this encounter: 65.5 kg.    Nutritional Assessment: Body mass index is 23.31 kg/m.Marland Kitchen Seen by dietician.  I agree with the assessment and plan as outlined below: Nutrition Status: Nutrition Problem: Severe Malnutrition Etiology: acute illness Signs/Symptoms: energy intake < or equal to 50% for > or equal to 5 days, percent weight loss Interventions: Refer to RD note for recommendations, Ensure Enlive (each supplement provides 350kcal and 20 grams of protein)  . Skin Assessment: I have examined the patient's skin and I agree with the wound assessment as performed by the wound care RN as outlined below:    Consultants:  ID Palliative care  Procedures:  As above  Antimicrobials:  Anti-infectives (From admission, onward)    Start     Dose/Rate Route Frequency Ordered Stop   12/02/21 1415  Ampicillin-Sulbactam (UNASYN) 3 g in sodium chloride 0.9 % 100 mL IVPB        3 g 200 mL/hr over 30 Minutes Intravenous Every 6 hours 12/02/21 1327     11/30/21 0000  micafungin (MYCAMINE) 150 mg in sodium chloride 0.9 % 100 mL IVPB  Status:  Discontinued        150 mg 107.5 mL/hr over 1 Hours Intravenous  Every 24 hours 11/29/21 1340 11/30/21 0756   11/29/21 0000  micafungin (MYCAMINE) 50 mg in sodium chloride 0.9 % 100 mL IVPB  Status:  Discontinued        50 mg 102.5 mL/hr over 1 Hours Intravenous Every 24 hours 11/28/21 1939 11/29/21 1340   11/28/21 2200  fostemsavir tromethamine (RUKOBIA) ER tablet 600 mg        600 mg Oral 2 times daily 11/28/21 1944     11/24/21 1000  abacavir-dolutegravir-lamiVUDine (TRIUMEQ) 600-50-300 MG per tablet 1 tablet        1 tablet Oral Daily 11/23/21 1107     11/23/21 1130  piperacillin-tazobactam (ZOSYN) IVPB 3.375 g  Status:  Discontinued        3.375 g 12.5 mL/hr over 240 Minutes Intravenous Every 8 hours 11/23/21 1042 11/28/21 1937   11/20/21 1500  rifabutin (MYCOBUTIN) capsule 300 mg        300 mg Oral Daily 11/20/21 1341     11/20/21 1430  sulfamethoxazole-trimethoprim (  BACTRIM DS) 800-160 MG per tablet 1 tablet        1 tablet Oral Daily 11/20/21 1342     11/17/21 0800  vancomycin (VANCOCIN) IVPB 1000 mg/200 mL premix  Status:  Discontinued        1,000 mg 200 mL/hr over 60 Minutes Intravenous Every 12 hours 11/16/21 1807 11/17/21 1048   11/16/21 1830  vancomycin (VANCOREADY) IVPB 1500 mg/300 mL        1,500 mg 150 mL/hr over 120 Minutes Intravenous STAT 11/16/21 1758 11/16/21 2048   11/16/21 1800  ceFEPIme (MAXIPIME) 2 g in sodium chloride 0.9 % 100 mL IVPB  Status:  Discontinued        2 g 200 mL/hr over 30 Minutes Intravenous Every 8 hours 11/16/21 1756 11/17/21 1048   11/11/21 2200  azithromycin (ZITHROMAX) tablet 500 mg        500 mg Oral Daily 11/11/21 1511     11/10/21 2200  azithromycin (ZITHROMAX) 500 mg in sodium chloride 0.9 % 250 mL IVPB  Status:  Discontinued        500 mg 250 mL/hr over 60 Minutes Intravenous Every 24 hours 11/10/21 1316 11/11/21 1511   11/10/21 1400  cefTRIAXone (ROCEPHIN) 2 g in sodium chloride 0.9 % 100 mL IVPB  Status:  Discontinued        2 g 200 mL/hr over 30 Minutes Intravenous Every 24 hours 11/10/21 1316  11/11/21 1511   11/10/21 1000  ethambutol (MYAMBUTOL) tablet 800 mg        800 mg Oral Daily 11/09/21 1840     11/10/21 1000  sulfamethoxazole-trimethoprim (BACTRIM DS) 800-160 MG per tablet 1 tablet  Status:  Discontinued        1 tablet Oral Daily 11/09/21 1840 11/10/21 1316   11/09/21 2200  azithromycin (ZITHROMAX) tablet 500 mg  Status:  Discontinued        500 mg Oral Daily at bedtime 11/09/21 1909 11/10/21 1316   11/09/21 1830  vancomycin (VANCOREADY) IVPB 1500 mg/300 mL        1,500 mg 150 mL/hr over 120 Minutes Intravenous STAT 11/09/21 1817 11/09/21 2157   11/09/21 1830  piperacillin-tazobactam (ZOSYN) IVPB 3.375 g        3.375 g 100 mL/hr over 30 Minutes Intravenous STAT 11/09/21 1817 11/09/21 1932   11/09/21 1700  bictegravir-emtricitabine-tenofovir AF (BIKTARVY) 50-200-25 MG per tablet 1 tablet  Status:  Discontinued        1 tablet Oral Daily 11/09/21 1608 11/23/21 1107         Subjective:  Seen and examined.  He has no complaints.  He is eager to go home.  Objective: Vitals:   12/06/21 0000 12/06/21 0514 12/06/21 0829 12/06/21 0913  BP: 103/72 95/64  92/75  Pulse: (!) 125 (!) 118  (!) 107  Resp: (!) 24 20  20   Temp: 100.1 F (37.8 C) 97.6 F (36.4 C)  97.9 F (36.6 C)  TempSrc: Oral Oral  Oral  SpO2: 95% 93% 92% 95%  Weight:      Height:        Intake/Output Summary (Last 24 hours) at 12/06/2021 1228 Last data filed at 12/06/2021 0600 Gross per 24 hour  Intake 900 ml  Output 315 ml  Net 585 ml    Filed Weights   11/09/21 2040 11/18/21 0430 11/28/21 1624  Weight: 67.9 kg 69.2 kg 65.5 kg    Examination:  General exam: Appears calm and comfortable, cachectic and chronically sick Respiratory system:  Clear to auscultation. Respiratory effort normal. Cardiovascular system: S1 & S2 heard, RRR. No JVD, murmurs, rubs, gallops or clicks. No pedal edema. Gastrointestinal system: Abdomen is nondistended, soft and nontender. No organomegaly or masses felt.  Normal bowel sounds heard. Central nervous system: Alert and oriented.  Right hemiparesis with contractures in right upper and lower extremity and right facial droop. Extremities: Symmetric 5 x 5 power. Skin: No rashes, lesions or ulcers.  Psychiatry: Judgement and insight appear poor  Data Reviewed: I have personally reviewed following labs and imaging studies  CBC: Recent Labs  Lab 11/30/21 0557 12/03/21 1035 12/05/21 0502  WBC 15.0* 8.4 7.9  NEUTROABS 12.1* 6.8 6.1  HGB 8.3* 8.0* 7.8*  HCT 26.8* 27.0* 25.6*  MCV 92.4 91.2 89.2  PLT 453* 481* 464*    Basic Metabolic Panel: Recent Labs  Lab 11/30/21 0557 12/01/21 0456 12/03/21 1036 12/05/21 0502  NA 136 136 135 138  K 4.9 4.9 3.7 3.7  CL 104 107 110 109  CO2 25 19* 16* 18*  GLUCOSE 92 94 124* 90  BUN 23* 11 5* <5*  CREATININE 1.50* 0.96 0.69 0.72  CALCIUM 11.1* 10.2 9.1 9.2  MG 2.1  --   --   --   PHOS 3.4  --   --   --     GFR: Estimated Creatinine Clearance: 103 mL/min (by C-G formula based on SCr of 0.72 mg/dL). Liver Function Tests: Recent Labs  Lab 11/30/21 0557 12/01/21 0456  AST 29 66*  ALT 13 50*  ALKPHOS 286* 330*  BILITOT 0.6 0.6  PROT 6.9 8.0  ALBUMIN 2.4* 2.8*    No results for input(s): "LIPASE", "AMYLASE" in the last 168 hours. No results for input(s): "AMMONIA" in the last 168 hours. Coagulation Profile: No results for input(s): "INR", "PROTIME" in the last 168 hours. Cardiac Enzymes: No results for input(s): "CKTOTAL", "CKMB", "CKMBINDEX", "TROPONINI" in the last 168 hours. BNP (last 3 results) No results for input(s): "PROBNP" in the last 8760 hours. HbA1C: No results for input(s): "HGBA1C" in the last 72 hours. CBG: No results for input(s): "GLUCAP" in the last 168 hours.  Lipid Profile: No results for input(s): "CHOL", "HDL", "LDLCALC", "TRIG", "CHOLHDL", "LDLDIRECT" in the last 72 hours. Thyroid Function Tests: No results for input(s): "TSH", "T4TOTAL", "FREET4", "T3FREE",  "THYROIDAB" in the last 72 hours. Anemia Panel: No results for input(s): "VITAMINB12", "FOLATE", "FERRITIN", "TIBC", "IRON", "RETICCTPCT" in the last 72 hours. Sepsis Labs: No results for input(s): "PROCALCITON", "LATICACIDVEN" in the last 168 hours.  Recent Results (from the past 240 hour(s))  Aerobic/Anaerobic Culture w Gram Stain (surgical/deep wound)     Status: None (Preliminary result)   Collection Time: 12/03/21 12:19 PM   Specimen: Abscess  Result Value Ref Range Status   Specimen Description   Final    ABSCESS Performed at [facility], [address] [place], [place] [address]    Special Requests   Final    Immunocompromised Performed at [facility], [address] [place], [place] [address]    Gram Stain   Final    ABUNDANT WBC PRESENT,BOTH PMN AND MONONUCLEAR NO ORGANISMS SEEN    Culture   Final    NO GROWTH 3 DAYS NO ANAEROBES ISOLATED; CULTURE IN PROGRESS FOR 5 DAYS Performed at [facility], [address], [place], [place] [address]    Report Status PENDING  Incomplete  Culture, fungus without smear     Status: None (Preliminary result)  Collection Time: 12/03/21  5:39 PM   Specimen: Abscess; Other  Result Value Ref Range Status   Specimen Description   Final    ABSCESS Performed at [facility], [address] [place], [place] [address]    Special Requests   Final    Immunocompromised Performed at [facility], [address] [place], [place] [address]    Culture   Final    NO FUNGUS ISOLATED AFTER 3 DAYS Performed at [facility], [address] [place], [place] [address]    Report Status PENDING  Incomplete  Acid Fast Smear (AFB)     Status: None   Collection Time: 12/03/21  7:02 PM   Specimen: Abscess  Result Value Ref Range Status   AFB Specimen Processing Concentration  Final   Acid Fast Smear Negative  Final    Comment: (NOTE) Performed At: [facility] [address] [place], [place] [id number] Jolene Schimke MD OZ:[id number]    Source (AFB) ABSCESS  Final    Comment: Performed at [facility], [address] [place], [place] [address]  Aerobic/Anaerobic Culture w Gram Stain (surgical/deep wound)     Status: None (Preliminary result)   Collection Time: 12/05/21  1:21 PM   Specimen: Abscess  Result Value Ref Range Status   Specimen Description   Final    ABSCESS Performed at [facility], [address] [place], [place] [address]    Special Requests JUST BELOW THYROID AT MIDLINE  Final   Gram Stain NO WBC SEEN NO ORGANISMS SEEN   Final   Culture   Final    NO GROWTH < 12 HOURS Performed at [facility], [address], [place], [place] [address]    Report Status PENDING  Incomplete      Radiology Studies: [place] FNA SOFT TISSUE  Result Date: 12/05/2021 INDICATION: 46 year old with history of HIV and scattered lymphadenopathy and abscesses. Patient has a complex cystic lesion or abscess in the lower midline of the neck just below the thyroid tissue. EXAM: Ultrasound-guided aspiration of lower neck collection MEDICATIONS: 1% lidocaine, local anesthetic ANESTHESIA/SEDATION: None COMPLICATIONS: None immediate. PROCEDURE: Informed written consent was obtained from the patient after a thorough discussion of the procedural risks, benefits and alternatives. All questions were addressed. A timeout was performed prior to the initiation of the procedure. The patient's lower neck was evaluated with ultrasound. Poorly defined hypoechoic area just below the thyroid tissue was identified. The skin was prepped with chlorhexidine and sterile field was created. Skin was anesthetized using 1% lidocaine. Using ultrasound guidance, a 25 gauge needle was directed into this lesion for cytology and a small amount of purulent fluid was aspirated. Using ultrasound guidance, a 22 gauge needle was then  directed into the collection and additional purulent fluid was aspirated. Subsequently, an 18 gauge spinal needle was directed into the collection and 3 mL of white purulent/necrotic fluid was obtained. Fluid was collected for cytology and culture. Bandage placed over the puncture site. FINDINGS: Poorly defined hypoechoic lesion or collection in the lower midline just below the thyroid tissue. Needle position confirmed within the lesion. Approximately 3 mL of white necrotic or purulent fluid was aspirated from this collection. IMPRESSION: Ultrasound-guided aspiration of the necrotic lesion or abscess in the lower neck. Electronically Signed   By: Richarda Overlie M.D.   On: 12/05/2021 17:40    Scheduled Meds:  abacavir-dolutegravir-lamiVUDine  1 tablet Oral Daily   azithromycin  500 mg Oral Daily   B-complex with vitamin C  1 tablet Oral Daily   dronabinol  5 mg Oral TID AC   ethambutol  800 mg Oral Daily   feeding supplement  237 mL Oral QID   fostemsavir tromethamine  600 mg Oral BID   ketoconazole   Topical BID   metoCLOPramide (REGLAN) injection  5 mg Intravenous Q6H   metoprolol succinate  100 mg Oral q morning   mirtazapine  15 mg Oral QHS   mometasone-formoterol  2 puff Inhalation BID   nystatin  5 mL Mouth/Throat QID   pantoprazole  40 mg Oral QAC breakfast   QUEtiapine  200 mg Oral QHS   rifabutin  300 mg Oral Daily   rivaroxaban  20 mg Oral Q supper   rosuvastatin  5 mg Oral q morning   sodium chloride flush  3 mL Intravenous Q12H   sulfamethoxazole-trimethoprim  1 tablet Oral Daily   umeclidinium bromide  1 puff Inhalation Daily   Continuous Infusions:  ampicillin-sulbactam (UNASYN) IV 3 g (12/06/21 0918)     LOS: 26 days   Kurt Closs, MD [facility]  12/06/2021, 12:28 PM   *Please note that this is a verbal dictation therefore any spelling or grammatical errors are due to the "[employer]agon [employer] One" system interpretation.  Please page via Amion and do not message via  secure chat for urgent patient care matters. Secure chat can be used for non urgent patient care matters.  How to contact the [facility] Attending or Consulting provider 7A - 7P or covering provider during after hours 7P -7A, for this patient?  Check the care team in [facility] and look for a) attending/consulting [facility] provider listed and b) the [facility] team listed. Page or secure chat 7A-7P. Log into [email or web address] and use [facility]'s universal password to access. If you do not have the password, please contact the hospital operator. Locate the [facility] provider you are looking for under [facility] and page to a number that you can be directly reached. If you still have difficulty reaching the provider, please page the [facility] (Director on Call) for the Hospitalists listed on amion for assistance.

## 2021-12-07 ENCOUNTER — Other Ambulatory Visit (HOSPITAL_COMMUNITY): Payer: Self-pay

## 2021-12-07 ENCOUNTER — Ambulatory Visit: Payer: Medicaid Other | Admitting: Internal Medicine

## 2021-12-07 DIAGNOSIS — R591 Generalized enlarged lymph nodes: Secondary | ICD-10-CM | POA: Diagnosis not present

## 2021-12-07 LAB — HISTOPLASMA ANTIGEN, URINE: Histoplasma Antigen, urine: <0.5 (ref <0.5)

## 2021-12-07 MED ORDER — SULFAMETHOXAZOLE-TRIMETHOPRIM 800-160 MG PO TABS
1.0000 | ORAL_TABLET | Freq: Every day | ORAL | 0 refills | Status: AC
  Filled 2021-12-07: qty 30, 30d supply, fill #0

## 2021-12-07 MED ORDER — METOPROLOL SUCCINATE ER 100 MG PO TB24
100.0000 mg | ORAL_TABLET | Freq: Every morning | ORAL | 0 refills | Status: AC
  Filled 2021-12-07: qty 30, 30d supply, fill #0

## 2021-12-07 MED ORDER — METOPROLOL TARTRATE 25 MG PO TABS
12.5000 mg | ORAL_TABLET | Freq: Two times a day (BID) | ORAL | Status: DC
Start: 2021-12-07 — End: 2021-12-07

## 2021-12-07 MED ORDER — DRONABINOL 5 MG PO CAPS
5.0000 mg | ORAL_CAPSULE | Freq: Three times a day (TID) | ORAL | 0 refills | Status: AC
  Filled 2021-12-07: qty 90, 30d supply, fill #0

## 2021-12-07 MED ORDER — METOCLOPRAMIDE HCL 5 MG PO TABS
5.0000 mg | ORAL_TABLET | Freq: Three times a day (TID) | ORAL | 0 refills | Status: AC
  Filled 2021-12-07: qty 120, 30d supply, fill #0

## 2021-12-07 MED ORDER — AMOXICILLIN-POT CLAVULANATE 875-125 MG PO TABS
1.0000 | ORAL_TABLET | Freq: Two times a day (BID) | ORAL | 0 refills | Status: AC
  Filled 2021-12-07: qty 60, 30d supply, fill #0

## 2021-12-07 MED ORDER — ETHAMBUTOL HCL 400 MG PO TABS
800.0000 mg | ORAL_TABLET | Freq: Every day | ORAL | 0 refills | Status: AC
  Filled 2021-12-07: qty 60, 30d supply, fill #0

## 2021-12-07 MED ORDER — AZITHROMYCIN 500 MG PO TABS
500.0000 mg | ORAL_TABLET | Freq: Every day | ORAL | 0 refills | Status: AC
  Filled 2021-12-07: qty 30, 30d supply, fill #0

## 2021-12-07 MED ORDER — OXYCODONE HCL 5 MG PO TABS
5.0000 mg | ORAL_TABLET | Freq: Four times a day (QID) | ORAL | 0 refills | Status: AC | PRN
Start: 2021-12-07 — End: ?
  Filled 2021-12-07: qty 20, 5d supply, fill #0

## 2021-12-07 MED ORDER — ABACAVIR-DOLUTEGRAVIR-LAMIVUD 600-50-300 MG PO TABS
1.0000 | ORAL_TABLET | Freq: Every day | ORAL | 0 refills | Status: AC
  Filled 2021-12-07: qty 30, 30d supply, fill #0

## 2021-12-07 MED ORDER — RIFABUTIN 150 MG PO CAPS
300.0000 mg | ORAL_CAPSULE | Freq: Every day | ORAL | 0 refills | Status: AC
  Filled 2021-12-07: qty 60, 30d supply, fill #0

## 2021-12-07 NOTE — Progress Notes (Signed)
   12/07/21 0526  Assess: MEWS Score  Temp 99 F (37.2 C)  BP 120/85  MAP (mmHg) 94  Pulse Rate (!) 117  Resp 20  SpO2 97 %  O2 Device Room Air  Assess: MEWS Score  MEWS Temp 0  MEWS Systolic 0  MEWS Pulse 2  MEWS RR 0  MEWS LOC 0  MEWS Score 2  MEWS Score Color Yellow  Assess: if the MEWS score is Yellow or Red  Were vital signs taken at a resting state? Yes  Focused Assessment No change from prior assessment  Does the patient meet 2 or more of the SIRS criteria? No  Does the patient have a confirmed or suspected source of infection? Yes  Provider and Rapid Response Notified? No  MEWS guidelines implemented *See Row Information* No, previously yellow, continue vital signs every 4 hours  Notify: Charge Nurse/RN  Name of Charge Nurse/RN Notified Phylliss Blakes RN  Date Charge Nurse/RN Notified 12/07/21  Time Charge Nurse/RN Notified 0300  Assess: SIRS CRITERIA  SIRS Temperature  0  SIRS Pulse 1  SIRS Respirations  0  SIRS WBC 0  SIRS Score Sum  1

## 2021-12-07 NOTE — Progress Notes (Signed)
[facility]  Date of Admission:  11/09/2021           Reason for visit: Follow up on HIV   ASSESSMENT:    46 y.o. male admitted with:  # Fevers # Lymphadenopathy, upper lobe lung mass, psoas abscess - Fever curve has improved following resumption of Unasyn on 6/23.  Aspiration cultures from psoas and neck remain NGTD.  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  Suspect IRIS was contributory to this pathology   RECOMMENDATIONS:    Continue Triumeq.  Will not continue Rukobia at discharge in the hopes of decreasing his pill burden and uncertain benefit at this time Continue MAC therapy with azithromycin, rifabutin, and ethambutol Continue antibiotics and will switch to Augmentin at discharge x 30 more days for abscess coverage Follow IR cultures (bacterial, AFB, fungal) Okay to DC from ID perspective.  Follow up scheduled for patient on 01/12/22 with [name] as well as 12/29/21 with myself     Principal Problem:   Lymphadenopathy Active Problems:   Human immunodeficiency virus (HIV) disease ([place])   Severe recurrent major depression without psychotic features ([place])   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[place])   Emphysema, unspecified ([place])   History of CVA with residual right hemiparesis   Tachycardia   Insomnia   Abnormal LFTs   Mass of upper lobe of right lung   Pulmonary embolism ([facility])   SIRS (systemic inflammatory response syndrome) ([place])   Community acquired pneumonia of right lung   Mediastinal lymphadenopathy   Goals of care, counseling/discussion   Weakness   Encounter for palliative care   Oropharyngeal dysphagia    Protein-calorie malnutrition, severe    MEDICATIONS:    Scheduled Meds:  abacavir-dolutegravir-lamiVUDine  1 tablet Oral Daily   azithromycin  500 mg Oral Daily   B-complex with vitamin C  1 tablet Oral Daily   dronabinol  5 mg Oral TID AC   ethambutol  800 mg Oral Daily   feeding supplement  237 mL Oral QID   fostemsavir tromethamine  600 mg Oral BID   ketoconazole   Topical BID   metoCLOPramide (REGLAN) injection  5 mg Intravenous Q6H   metoprolol succinate  100 mg Oral q morning   mirtazapine  15 mg Oral QHS   mometasone-formoterol  2 puff Inhalation BID   nystatin  5 mL Mouth/Throat QID   pantoprazole  40 mg Oral QAC breakfast   QUEtiapine  200 mg Oral QHS   rifabutin  300 mg Oral Daily   rivaroxaban  20 mg Oral Q supper   rosuvastatin  5 mg Oral q morning   sodium chloride flush  3 mL Intravenous Q12H   sulfamethoxazole-trimethoprim  1 tablet Oral Daily   umeclidinium bromide  1 puff Inhalation Daily   Continuous Infusions:  ampicillin-sulbactam (UNASYN) IV 3 g (12/07/21 0512)   PRN Meds:.acetaminophen (TYLENOL) oral liquid 160 mg/5 mL, acetaminophen **OR** acetaminophen, baclofen, chlorproMAZINE, guaiFENesin-dextromethorphan, hydrALAZINE, ibuprofen, ipratropium-albuterol, metoprolol tartrate, morphine injection, mouth rinse, oxyCODONE, polyethylene glycol, senna-docusate  SUBJECTIVE:   24 hour events:  Tmax 99.5 No major events  Patient reports that he is feeling okay today and would really like to go home.  He more more alert and talkative than yesterday.  No fevers.  Tolerating medications at this time.    Review of Systems  All other systems reviewed and are negative.     OBJECTIVE:   Blood pressure 120/85, pulse (!) 117, temperature 99 F (37.2 C), resp. rate 20, height _0  (1.676 m), weight 65.5 kg, SpO2 97 %. Body mass index is 23.31 kg/m.  Physical Exam Constitutional:      Comments: He is much more alert this morning.  States he would really  like to go home today.   Eyes:     Extraocular Movements: Extraocular movements intact.     Conjunctiva/sclera: Conjunctivae normal.  Pulmonary:     Effort: Pulmonary effort is normal. No respiratory distress.  Abdominal:     General: There is no distension.     Palpations: Abdomen is soft.     Tenderness: There is no abdominal tenderness.  Skin:    General: Skin is warm and dry.     Findings: No rash.  Neurological:     General: No focal deficit present.     Mental Status: He is oriented to person, place, and time. Mental status is at baseline.  Psychiatric:        Mood and Affect: Mood normal.        Behavior: Behavior normal.      Lab Results: Lab Results  Component Value Date   WBC 7.9 12/05/2021   HGB 7.8 (L) 12/05/2021   HCT 25.6 (L) 12/05/2021   MCV 89.2 12/05/2021   PLT 464 (H) 12/05/2021    Lab Results  Component Value Date   NA 138 12/05/2021   K 3.7 12/05/2021   CO2 18 (L) 12/05/2021   GLUCOSE 90 12/05/2021   BUN <5 (L) 12/05/2021   CREATININE 0.72 12/05/2021   CALCIUM 9.2 12/05/2021   GFRNONAA >60 12/05/2021   GFRAA 127 08/06/2019    Lab Results  Component Value Date   ALT 50 (H) 12/01/2021   AST 66 (H) 12/01/2021   ALKPHOS 330 (H) 12/01/2021   BILITOT 0.6 12/01/2021    No results found for: "CRP"  No results found for: "ESRSEDRATE"   I have reviewed the micro and lab results in Epic.  Imaging: [place] FNA SOFT TISSUE  Result Date: 12/05/2021 INDICATION: 46 year old with history of HIV and scattered lymphadenopathy and abscesses. Patient has a complex cystic lesion or abscess in the lower midline of the neck just below the thyroid tissue. EXAM: Ultrasound-guided aspiration of lower neck collection MEDICATIONS: 1% lidocaine, local anesthetic ANESTHESIA/SEDATION: None COMPLICATIONS: None immediate. PROCEDURE: Informed written consent was obtained from the patient after a thorough discussion of the procedural risks, benefits and alternatives. All  questions were addressed. A timeout was performed prior to the initiation of the procedure. The patient's lower neck was evaluated with ultrasound. Poorly defined hypoechoic area just below the thyroid tissue was identified. The skin was prepped with chlorhexidine and sterile field was created. Skin was anesthetized using 1% lidocaine. Using ultrasound guidance, a 25 gauge needle was directed into this lesion for cytology and a small amount of purulent fluid was aspirated. Using ultrasound guidance, a 22 gauge needle was then directed into the collection and additional purulent fluid was aspirated. Subsequently, an 18 gauge spinal needle was directed into the collection and 3 mL of white purulent/necrotic fluid was obtained. Fluid was collected for cytology and culture. Bandage placed over the puncture site. FINDINGS: Poorly defined hypoechoic lesion or collection in the  lower midline just below the thyroid tissue. Needle position confirmed within the lesion. Approximately 3 mL of white necrotic or purulent fluid was aspirated from this collection. IMPRESSION: Ultrasound-guided aspiration of the necrotic lesion or abscess in the lower neck. Electronically Signed   By: Markus Daft M.D.   On: 12/05/2021 17:40     Imaging independently reviewed in Epic.    [facility] [phone] pager 12/07/2021, 10:41 AM    I have personally spent 50 minutes involved in face-to-face and non-face-to-face activities for this patient on the day of the visit. Professional time spent includes the following activities: Preparing to see the patient (review of tests), Obtaining and/or reviewing separately obtained history (admission/discharge record), Performing a medically appropriate examination and/or evaluation , Ordering medications/tests/procedures, referring and communicating with other health care professionals, Documenting clinical information in the  EMR, Independently interpreting results (not separately reported), Communicating results to the patient/family/caregiver, Counseling and educating the patient/family/caregiver and Care coordination (not separately reported).

## 2021-12-07 NOTE — Discharge Summary (Signed)
[facility] Discharge Summary  Kurt Foley VHQ:[id number] DOB: [birth year] DOA: 11/09/2021  PCP: Gildardo Pounds, NP  Admit date: 11/09/2021 Discharge date: 12/07/2021 30 Day Unplanned Readmission Risk Score    Flowsheet Row ED to Hosp-Admission (Current) from 11/09/2021 in [facility]  30 Day Unplanned Readmission Risk Score (%) 44.76 Filed at 12/07/2021 0801       This score is the patient's risk of an unplanned readmission within 30 days of being discharged (0 -100%). The score is based on dignosis, age, lab data, medications, orders, and past utilization.   Low:  0-14.9   Medium: 15-21.9   High: 22-29.9   Extreme: 30 and above          Admitted From: Home Disposition: Home  Recommendations for Outpatient Follow-up:  Follow up with PCP in 1-2 weeks Please obtain BMP/CBC in one week Follow-up with ID on 12/29/2021. Follow-up with pulmonology in 2 to 3 weeks. Please follow up with your PCP on the following pending results: Unresulted Labs (From admission, onward)     Start     Ordered   12/05/21 1448  Acid Fast Smear (AFB)  (AFB smear + Culture w reflexed sensitivities panel)  Once,   R       Comments: To be collected by radiology on Monday with FNA   Question Answer Comment  Patient immune status Immunocompromised   Release to patient Immediate     See Hyperspace for full Linked Orders Report.   12/02/21 1449   12/05/21 1448  Acid Fast Culture with reflexed sensitivities  (AFB smear + Culture w reflexed sensitivities panel)  Once,   R       Question:  Patient immune status  Answer:  Immunocompromised  See Hyperspace for full Linked Orders Report.   12/02/21 1449   12/05/21 1322  Acid Fast Smear (AFB)  (AFB smear + Culture w reflexed sensitivities panel)  Once,   R       See Hyperspace for full Linked Orders Report.   12/05/21 1321   12/05/21 1322  Acid Fast Culture with reflexed sensitivities  (AFB smear + Culture w  reflexed sensitivities panel)  Once,   R       See Hyperspace for full Linked Orders Report.   12/05/21 1321   12/05/21 1321  Fungus Culture With Stain  Once,   R       Comments: Abscess just below thyroid at midline    12/05/21 1321   12/03/21 1740  Acid Fast Culture with reflexed sensitivities  (AFB smear + Culture w reflexed sensitivities panel)  Add-on,   AD       Question:  Patient immune status  Answer:  Immunocompromised  See Hyperspace for full Linked Orders Report.   12/03/21 1739   12/01/21 1004  Histoplasma antigen, urine  Once,   R        12/01/21 1003   12/01/21 1004  Blastomyces Antigen  Once,   R        12/01/21 1003   11/24/21 0500  CBC  Every third day,   R     Question:  Specimen collection method  Answer:  Lab=Lab collect   11/21/21 0815   11/18/21 1004  Acid Fast Culture with reflexed sensitivities  (AFB smear + Culture w reflexed sensitivities panel)  Once,   R       Question:  Patient immune status  Answer:  Immunocompromised  See Hyperspace for  full Linked Orders Report.   11/18/21 1003   11/14/21 1443  Acid Fast Culture with reflexed sensitivities  RELEASE UPON ORDERING,   TIMED       Comments: Specimen B: Phone [phone]         Previous Biopsy:  no Is the patient on airborne/droplet precautions? No           Clinical History:  N/A Copy of Report to:  N/A Specimen Disposition: Cytology and Microbiology     11/14/21 1443   11/11/21 1452  Acid Fast Culture with reflexed sensitivities  (AFB smear + Culture w reflexed sensitivities panel)  Once,   R       See Hyperspace for full Linked Orders Report.   11/11/21 [place]: None Equipment/Devices: None  Discharge Condition: Stable CODE STATUS: Full code Diet recommendation: Cardiac  Subjective: Seen and examined.  He has no complaints.  He is very adamant to discharge him home today stating that " I do not appreciate how I am being treated by the nurses, I am not used to that, I  am doing fine, I cannot stay here any longer, please discharge me home".  Patient's primary nurse was present during the encounter as well.  When me and the nurse both asked him to elaborate further and give [place] the details, he was not able to provide the details and he was just focused on repeating himself requesting to discharge.  Brief/Interim Summary: Please note that this patient was hospitalized for about a month, I saw this patient for about 8 days.  Following discharge summary is the best I can do in current circumstances with extended hospitalization and multiple issues going on.    46 year old with advanced AIDS, disseminated MAC substance abuse, depression, prior CVA with residual right-sided weakness, pulmonary embolism on Xarelto admitted for cough and palpitations.  Repeat CT showed abnormal lung parenchyma.  He was seen by pulmonary, cultures remain negative.  Underwent bronchoscopy with EBUS on 6/5 with subcarinal lymph node sampling and brushing with BAL of right upper lobe mass.  Brushings remain negative, IR consulted for CT-guided biopsy.      ID feels that they do not expect any meaningful recovery but patient wishes to continue his current treatments and wants to consider physical rehabilitation.  Detailed hospitalization as below.   SIRS with recurrent fevers History of disseminated MAC infection - Seen by ID.  This is likely secondary to disseminated MAC history.  -ID feels that he has no real source of his fever but could be secondary to his disseminated MRSA infection -ID recommended GI evaluation given inability to keep food down and nausea and vomiting but GI evaluated and feels that he has no evidence of this and the patient's main issue is singultus.  [name] does not feel that his nausea vomiting is a primary GI process and recommending symptomatic treatment and scheduling antiemetics as well as possible treatment with fluconazole if concern for infectious esophagitis -GI  signed off the case and has recommended hospice but patient has declined; palliative continues to have goals of care discussion; ID started the patient on Mycobutin. ID discontinued antibiotics and decided to watch off antibiotics.  However, patient started having persistent fever since 11/30/2021.  CT chest abdomen repeated by ID on 12/01/2021 shows extensive necrotic appearing lymph nodes in the chest and abdomen with right upper lobe lesion as well as psoas abscesses that  seem associated with necrotic adenopathy in the retroperitoneum.  ID reinitiated patient on Zosyn.  IR  consulted and patient was scheduled to have biopsy of 1 of those lymph nodes.  IR also drained his iliopsoas abscess on 12/03/2021 and then fine-needle aspiration of the neck abscess on 12/05/2021.  Cultures are still negative to date.  However patient's fever improved, patient's last fever spike of 100.7 was 8 AM on 12/04/2021.  Patient eager to go home and demanding discharge.  Discussed with Dr. Juleen China of ID who has cleared him for discharge and they have also sent all the modified new medications to the pharmacy as listed below.   Right upper lobe mass: Dyspnea with shortness of breath  -Status post bronchoscopy with EBUS on 6/5.  Brushings remain negative.  Case discussed with IR who recommends outptn PET scan. Spoke with Dr Lamonte Sakai from [facility], who also reviewed images and recommended outptn PET followed by follow up with Dr Valeta Harms for discussion of possible Navigation Bronch.    AKI/metabolic acidosis: Both had resolved initially.  creatinine jumped back to 1.5 on 11/30/2021, IV fluids started, AKI resolved.    History of PE and DVT: Continue Xarelto.    History of CVA -Residual right-sided weakness with right hand and arm contractures.  On statin.  PT OT on board.   Emphysema -Bronchodilators to be continued.   Substance use -Counseled to quit using.   Depression -Continue with on Seroquel and Remeron   Hyponatremia:  Resolved.   Normocytic Anemia: Stable.   GERD -Daily PPI with Pantoprazole 40 mg po Daily   Elevated LFTs: Improved.   Nausea/poor p.o. intake: Symptoms improving.  Continue Reglan.   Goals of care Severe protein calorie malnutrition -Advanced HIV with very guarded prognosis.  -Palliative care following-he wishes to be full code -Supplements.  Marinol added.  Appreciate palliative care help.  Discharge plan was discussed with patient and his aunt Olivia Mackie over the phone, they verbalized understanding and agreed with it.  Discharge Diagnoses:  Principal Problem:   Lymphadenopathy Active Problems:   Mass of upper lobe of right lung   Human immunodeficiency virus (HIV) disease (HCC)   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HCC)   Emphysema, unspecified ([place])   Abnormal LFTs   History of CVA with residual right hemiparesis   Pulmonary embolism (HCC)   Severe recurrent major depression without psychotic features (HCC)   Tachycardia   Insomnia   SIRS (systemic inflammatory response syndrome) ([place])   Community acquired pneumonia of right lung   Mediastinal lymphadenopathy   Goals of care, counseling/discussion   Weakness   Encounter for palliative care   Oropharyngeal dysphagia   Protein-calorie malnutrition, severe    Discharge Instructions   Allergies as of 12/07/2021       Reactions   Lidocaine Other (See Comments)   Large bruise at site of patch placement        Medication List     STOP taking these medications    Aspirin Low Dose 81 MG chewable tablet Generic drug: aspirin   bictegravir-emtricitabine-tenofovir AF 50-200-25 MG Tabs tablet Commonly known as: BIKTARVY   megestrol 40 MG/ML suspension Commonly known as: MEGACE   nystatin cream Commonly known as: MYCOSTATIN       TAKE these medications    abacavir-dolutegravir-lamiVUDine 600-50-300 MG tablet Commonly known as: TRIUMEQ Take 1 tablet by mouth daily. Start taking on: December 08, 2021   acetaminophen 325 MG tablet Commonly known as: TYLENOL Take 1-2 tablets (325-650 mg total)  by mouth every 4 (four) hours as needed for mild pain.   amoxicillin-clavulanate 875-125 MG tablet Commonly known as: AUGMENTIN Take 1 tablet by mouth 2 times daily.   azithromycin 500 MG tablet Commonly known as: ZITHROMAX Take 1 tablet by mouth at bedtime.   CertaVite/Antioxidants Tabs Take 1 tablet by mouth daily.   dronabinol 5 MG capsule Commonly known as: MARINOL Take 1 capsule (5 mg total) by mouth 3 (three) times daily before meals.   ethambutol 400 MG tablet Commonly known as: MYAMBUTOL Take 2 tablets by mouth daily.   loratadine 10 MG tablet Commonly known as: CLARITIN Take 1 tablet (10 mg total) by mouth daily. What changed: when to take this   metoCLOPramide 5 MG tablet Commonly known as: Reglan Take 1 tablet (5 mg total) by mouth 4 (four) times daily -  before meals and at bedtime.   metoprolol succinate 100 MG 24 hr tablet Commonly known as: TOPROL-XL Take 1 tablet (100 mg total) by mouth every morning. Take with or immediately following a meal. Start taking on: December 08, 2021 What changed:  medication strength how much to take additional instructions   mirtazapine 15 MG tablet Commonly known as: Remeron Take 1 tablet (15 mg total) by mouth at bedtime.   oxyCODONE 5 MG immediate release tablet Commonly known as: Oxy IR/ROXICODONE Take 1 tablet (5 mg total) by mouth every 6 (six) hours as needed for moderate pain or severe pain.   pantoprazole 40 MG tablet Commonly known as: PROTONIX Take 1 tablet by mouth daily before breakfast.   polyethylene glycol 17 g packet Commonly known as: MIRALAX / GLYCOLAX Take 17 g by mouth daily as needed for mild constipation or moderate constipation.   QUEtiapine 200 MG tablet Commonly known as: SEROquel Take 1 tablet (200 mg total) by mouth at bedtime.   rifabutin 150 MG capsule Commonly known as:  MYCOBUTIN Take 2 capsules by mouth daily. Start taking on: December 08, 2021   rosuvastatin 5 MG tablet Commonly known as: Crestor Take 1 tablet (5 mg total) by mouth daily. What changed: when to take this   Senexon-S 8.6-50 MG tablet Generic drug: senna-docusate Take 1 tablet by mouth 2 (two) times daily.   sulfamethoxazole-trimethoprim 800-160 MG tablet Commonly known as: BACTRIM DS Take 1 tablet by mouth daily.   Symbicort 160-4.5 MCG/ACT inhaler Generic drug: budesonide-formoterol Inhale 2 puffs into the lungs in the morning and at bedtime.   umeclidinium bromide 62.5 MCG/ACT Aepb Commonly known as: INCRUSE ELLIPTA Inhale 1 puff into the lungs daily.   Ventolin HFA 108 (90 Base) MCG/ACT inhaler Generic drug: albuterol Inhale 2 puffs into the lungs every 6 hours as needed for wheezing or shortness of breath.   Xarelto Starter Pack Generic drug: Rivaroxaban Stater Pack (15 mg and 20 mg) Follow package directions: Take one 68m tablet by mouth twice a day. On day 22, switch to one 265mtablet once a day. Take with food.        [place]   Specialty: Rehabilitation Why: They will call to set up visit. Contact information: [id number]      FlGildardo PoundsNP Follow up in 1 week(s).   Specialty: Nurse Practitioner Contact information: [address] [phone]              Allergies  Allergen Reactions   Lidocaine Other (See Comments)    Large bruise at site of patch placement    Consultations: IR, PCCM, palliative care, ID, GI   Procedures/Studies: [place] FNA SOFT TISSUE  Result Date: 12/05/2021 INDICATION: 46 year old with history of HIV and scattered lymphadenopathy and abscesses. Patient has a complex cystic lesion or abscess in the lower midline of the neck just below the  thyroid tissue. EXAM: Ultrasound-guided aspiration of lower neck collection MEDICATIONS: 1% lidocaine, local anesthetic ANESTHESIA/SEDATION: None COMPLICATIONS: None immediate. PROCEDURE: Informed written consent was obtained from the patient after a thorough discussion of the procedural risks, benefits and alternatives. All questions were addressed. A timeout was performed prior to the initiation of the procedure. The patient's lower neck was evaluated with ultrasound. Poorly defined hypoechoic area just below the thyroid tissue was identified. The skin was prepped with chlorhexidine and sterile field was created. Skin was anesthetized using 1% lidocaine. Using ultrasound guidance, a 25 gauge needle was directed into this lesion for cytology and a small amount of purulent fluid was aspirated. Using ultrasound guidance, a 22 gauge needle was then directed into the collection and additional purulent fluid was aspirated. Subsequently, an 18 gauge spinal needle was directed into the collection and 3 mL of white purulent/necrotic fluid was obtained. Fluid was collected for cytology and culture. Bandage placed over the puncture site. FINDINGS: Poorly defined hypoechoic lesion or collection in the lower midline just below the thyroid tissue. Needle position confirmed within the lesion. Approximately 3 mL of white necrotic or purulent fluid was aspirated from this collection. IMPRESSION: Ultrasound-guided aspiration of the necrotic lesion or abscess in the lower neck. Electronically Signed   By: Markus Daft M.D.   On: 12/05/2021 17:40   CT ASPIRATION  Result Date: 12/03/2021 INDICATION: Small bilateral psoas abscesses, left greater than right, and history of chronic HIV. Aspiration of psoas collection requested for diagnostic purposes. EXAM: ASPIRATION OF LEFT PSOAS ABSCESS UNDER CT GUIDANCE MEDICATIONS: The patient is currently admitted to the hospital and receiving intravenous antibiotics. The antibiotics were  administered within an appropriate time frame prior to the initiation of the procedure. ANESTHESIA/SEDATION: Moderate (conscious) sedation was employed during this procedure. A total of Versed 1.0 mg and Fentanyl 25 mcg was administered intravenously by the radiology nurse. Total intra-service moderate Sedation Time: 10 minutes. The patient's level of consciousness and vital signs were monitored continuously by radiology nursing throughout the procedure under my direct supervision. COMPLICATIONS: None immediate. PROCEDURE: Informed written consent was obtained from the patient after a thorough discussion of the procedural risks, benefits and alternatives. All questions were addressed. Maximal Sterile Barrier Technique was utilized including caps, mask, sterile gowns, sterile gloves, sterile drape, hand hygiene and skin antiseptic. A timeout was performed prior to the initiation of the procedure. CT was performed through the abdomen in a prone position. A site was chosen along the left translumbar region and the skin prepped with chlorhexidine. Local anesthesia was provided with 0.5% bupivacaine as the patient is allergic to lidocaine. An 18 gauge trocar needle was advanced from a left translumbar approach into the left psoas muscle at roughly the L3 level. Fluid was aspirated from the needle and the needle removed. The entire fluid sample was sent for culture analysis. FINDINGS: Fluid collection in the anterior aspect of the left psoas muscle was targeted. Aspiration yielded purulent fluid. A total of 7 mL of fluid was able to be aspirated from the collection. IMPRESSION: CT-guided aspiration of left anterior psoas abscess fluid  collection yielding 7 mL of purulent fluid. The fluid sample was sent for culture analysis. Electronically Signed   By: Aletta Edouard M.D.   On: 12/03/2021 12:50   MR Lumbar Spine W Wo Contrast  Result Date: 12/02/2021 CLINICAL DATA:  Initial evaluation for possible spinal infection.  46 year old male with history of HIV with psoas abscesses on prior CT. EXAM: MRI LUMBAR SPINE WITHOUT AND WITH CONTRAST TECHNIQUE: Multiplanar and multiecho pulse sequences of the lumbar spine were obtained without and with intravenous contrast. CONTRAST:  54m GADAVIST GADOBUTROL 1 MMOL/ML IV SOLN COMPARISON:  Prior CT from 12/01/2021. FINDINGS: Segmentation: Transitional lumbosacral anatomy with sacralization of the L5 vertebral body. Same numbering system employed as on previous exam. Alignment: Vertebral bodies normally aligned with preservation of the normal lumbar lordosis. No listhesis. Vertebrae: Vertebral body height maintained without acute or chronic fracture. Bone marrow signal diffusely heterogeneous without discrete or worrisome osseous lesion. No abnormal marrow edema or enhancement to suggest acute osteomyelitis discitis or septic arthritis. Conus medullaris and cauda equina: Conus extends to the T12 level. Conus and cauda equina appear normal. No epidural abscess or other collection. No abnormal enhancement. Paraspinal and other soft tissues: Necrotic retroperitoneal adenopathy with scattered soft tissue stranding noted, better characterized on recent CT. Associated intramuscular abscesses involving the bilateral psoas muscles again seen. Abscess at the anterior margin of the left psoas muscle measures 2.0 x 1.3 x 6.4 cm (series 13, image 25). Abscess involving the anterior aspect of the right psoas muscle measures 0.9 x 1.3 x 3.6 cm (series 13, image 23). These collections are close proximity to the adjacent adenopathy, likely reflecting extra capsular/extra nodal extension of infection with abscess formation. No MRI evidence for involvement of the underlying vertebral column or spinal canal at this time. Remainder of the visualized paraspinous soft tissues otherwise within normal limits. Disc levels: L1-2:  Unremarkable. L2-3:  Unremarkable. L3-4: Negative interspace. Mild facet spurring. No canal  or foraminal stenosis. L4-5: Negative interspace. Mild bilateral facet hypertrophy. No significant canal or foraminal stenosis. L5-S1: Transitional lumbosacral anatomy with rudimentary L5-S1 interspace. No disc bulge or focal disc herniation. No stenosis. IMPRESSION: 1. Necrotic retroperitoneal adenopathy with associated intramuscular abscesses involving the bilateral psoas muscles as above, similar as compared to recent CT from 12/01/2021. No evidence for associated osteomyelitis discitis or septic arthritis within the adjacent lumbar spine. No epidural abscess or other collection. 2. Mild lower lumbar facet hypertrophy. No significant disc pathology or stenosis. Electronically Signed   By: BJeannine BogaM.D.   On: 12/02/2021 20:41   CT CHEST ABDOMEN PELVIS W CONTRAST  Result Date: 12/01/2021 CLINICAL DATA:  Acute hepatitis, ongoing fever in a 46year old male. * Tracking Code: BO * EXAM: CT CHEST, ABDOMEN, AND PELVIS WITH CONTRAST TECHNIQUE: Multidetector CT imaging of the chest, abdomen and pelvis was performed following the standard protocol during bolus administration of intravenous contrast. RADIATION DOSE REDUCTION: This exam was performed according to the departmental dose-optimization program which includes automated exposure control, adjustment of the mA and/or kV according to patient size and/or use of iterative reconstruction technique. CONTRAST:  1043mOMNIPAQUE IOHEXOL 300 MG/ML  SOLN COMPARISON:  Imaging from Nov 09, 2021 noncontrast chest abdomen and pelvis which was acquired on November 25, 2021. FINDINGS: CT CHEST FINDINGS Cardiovascular: Calcified and noncalcified atheromatous plaque of the thoracic aorta. Normal heart size. No pericardial effusion or thickening. Three-vessel coronary artery disease. Normal caliber of central pulmonary vessels. Mediastinum/Nodes: Heterogeneous and necrotic appearing area at the thoracic inlet, in hindsight  on prior imaging there was subtle enhancement in  this area on May 31st. This now measures 3.0 x 2.1 cm. No axillary adenopathy. Juxta hilar soft tissue is nearly confluent (image 20/2) 4.4 x 3.8 cm, when measured in a similar fashion this measures 3.4 x 4.2 cm on the study of Nov 09, 2021 and extends to just above the LEFT hilum. RIGHT hilar nodal tissue (image 23/2) 16 mm as compared to 15 mm. Necrotic appearing RIGHT paratracheal lymph node at 10 mm is unchanged. Lungs/Pleura: No change in the bandlike opacity in the RIGHT upper chest with adjacent nodularity since the study of 1 day ago. The more nodular aspect of this shows central low attenuation on contrasted imaging and measures approximately 2.3 cm greatest dimension (image 40/4) Nodules in the LEFT chest are similarly stable over just a few days time. Marked pulmonary emphysema. Airways are patent. No new consolidation. No pneumothorax. Musculoskeletal: See below for full musculoskeletal details. CT ABDOMEN PELVIS FINDINGS Hepatobiliary: Smooth hepatic contours. No focal, suspicious hepatic lesion. No pericholecystic stranding z. Mild biliary duct distension up to 6-7 mm without peribiliary enhancement of the supra pancreatic bile duct within the pancreas it is approximately 6 mm greatest caliber no substantial peribiliary enhancement. Pancreas: Normal, without mass, inflammation, top-normal caliber of the pancreatic duct appears diminished in size as compared to imaging from January 2023. Spleen: Normal. Adrenals/Urinary Tract: Adrenal glands are normal. Symmetric renal enhancement. No hydronephrosis. Urinary bladder is collapsed limiting assessment. Stomach/Bowel: No acute gastrointestinal findings. Mesenteric adenopathy unchanged from recent CT imaging. This and retroperitoneal nodal disease shows central necrosis. This appears worse when compared to imaging from October 06, 2021. The appendix is normal. No acute colonic process. Vascular/Lymphatic: Extensive atherosclerotic changes of the abdominal  aorta and iliac vessels. No aneurysmal dilation. Adenopathy with necrosis. Also within the retroperitoneum adjacent to necrotic appearing adenopathy are low-density areas within the bilateral psoas musculature (image 68/2) 1.7 x 1.5 cm on the LEFT and 1.5 cm on the RIGHT (image 68/2) Reproductive: Unremarkable by CT. Other: No free air.  No ascites Musculoskeletal: AVN of RIGHT humeral head. Psoas involvement from retroperitoneal process as discussed. In the coronal plane these areas extend approximately 4.1 cm on the LEFT and 3.2 cm on the RIGHT. These are centered about the L2-L3 disc space but are more closely associated with necrotic and enhancing adenopathy in the retroperitoneum. There is no destructive bone finding or finding that would indicate spinal involvement at this time IMPRESSION: 1. Necrotic appearing lymph nodes in the chest and abdomen in this patient with reported history of HIV and disseminated mycobacterial infection (MAI is reported) findings could certainly reflect stigmata of disseminated infection and by report show waxing and waning features, currently worse than in April of 2023. 2. 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 Given proximity to the spine would suggest close attention on follow-up to ensure no developing spinal involvement. If there is new back pain could consider MRI for further assessment though a fat plane is maintained between the spine and the psoas muscles on the current exam. 3. RIGHT upper lobe lesion with similar density and appearance to the lymph nodes seen on the current study is also favored to be related to disseminated infection though imaging findings of both neoplasm and infection in patients with HIV can show considerable overlap and have similar risk factors, therefore close follow-up is suggested. Sampling of the most accessible areas could be considered to confirm  diagnosis and ensure  targeted therapy is adequate. Most accessible area may be just inferior to the thyroid at the thoracic inlet which is definitely worse when compared to the study of May of 2023. 4. Stable mild dilation of the biliary tree as far back as January of 2023 perhaps even slightly diminished as compared to this more remote study. Significance uncertain. No focal hepatic lesion with patent portal veins. 5. AVN of the RIGHT humeral head. 6. Emphysema and aortic atherosclerosis. Aortic Atherosclerosis (ICD10-I70.0) and Emphysema (ICD10-J43.9). Electronically Signed   By: Zetta Bills M.D.   On: 12/01/2021 15:55   CT CHEST ABDOMEN PELVIS WO CONTRAST  Result Date: 11/25/2021 CLINICAL DATA:  Ongoing fever in a 46 year old male. * Tracking Code: BO * EXAM: CT CHEST, ABDOMEN AND PELVIS WITHOUT CONTRAST TECHNIQUE: Multidetector CT imaging of the chest, abdomen and pelvis was performed following the standard protocol without IV contrast. RADIATION DOSE REDUCTION: This exam was performed according to the departmental dose-optimization program which includes automated exposure control, adjustment of the mA and/or kV according to patient size and/or use of iterative reconstruction technique. COMPARISON:  CT of the chest October 06, 2021 and CT of the chest of May of 2023. FINDINGS: CT CHEST FINDINGS Cardiovascular: Calcified aortic atherosclerosis. Three-vessel coronary artery disease. Low-attenuation cardiac blood pool compatible with anemia. Normal caliber of central pulmonary vessels. Limited assessment of cardiovascular structures given lack of intravenous contrast. Mediastinum/Nodes: Fullness of the LEFT supraclavicular region. Suggestion of soft tissue between vessels in the LEFT neck and between musculature of the neck (image 2/2) 12 mm. Increased soft tissue in the AP window.  (Image 22/2) 18 mm. Borderline enlarged RIGHT paratracheal and subcarinal lymph nodes. No gross hilar lymphadenopathy though there is suprahilar  masslike consolidation that is contiguous with soft tissue in the AP window and has increased steadily over time dating back to January of 2022. On the January exam there is clear evidence of enlarged lymph nodes in the LEFT supraclavicular region. These images were obtained at an outside facility, [facility]. At that time there was diffuse adenopathy in the chest and abdomen. Lungs/Pleura: Pleural base nodule in the RIGHT upper lobe associated with bandlike opacity but with more nodular component (image 44/6) 2.3 x 1.4 cm the area measured on today's study corresponds to an area that measured approximately 2.5 x 1.3 cm previously. Bandlike thickening tracks along the pleural surface into the RIGHT lung apex. Signs of pulmonary emphysema. Stable LEFT upper lobe pulmonary nodules (image 58/6 as an example an 8 mm nodule with a slightly larger nodule in the medial LEFT upper lobe. Musculoskeletal: See below for full musculoskeletal details. CT ABDOMEN PELVIS FINDINGS Hepatobiliary: Liver with smooth contours. No pericholecystic stranding. No gross biliary duct distension. Pancreas: Normal contour. No gross signs of inflammation. Mild pancreatic ductal dilation is unchanged. Spleen: Normal size and contour. Adrenals/Urinary Tract: Adrenal glands are normal. Smooth renal contours showing mild perinephric stranding. No perinephric fluid. No hydronephrosis. Urinary bladder with smooth contours without bladder wall thickening or adjacent stranding. Stomach/Bowel: Mild stranding in the LEFT upper quadrant jejunal mesentery. It increasing lymph nodes within the central small bowel mesentery (image 75/2) 17 mm lymph node is more irregular and denser than lymph nodes that were present in this area previously. Stranding about the jejunum in the mesentery is slightly increased as well. Another example of increased enlargement of lymph nodes in the upper abdomen (image 76/2) 15 mm at the root of the small  bowel mesentery,  largest lymph node previously 10 mm. Vascular/Lymphatic: Juxta crural lymph node (image 52/2) 13 mm previously less than a cm. Enlargement of nodal tissue adjacent the LEFT adrenal gland (image 58/2) 13 mm previously less than a cm. LEFT and RIGHT paratracheal nodal tissue up to 15 mm short axis (image 67/2) previously 11 mm. Reproductive: Unremarkable by CT. Other: No ascites Musculoskeletal: No acute bone finding or destructive bone process avascular necrosis of RIGHT humeral head. This is unchanged compared to recent comparison imaging. IMPRESSION: 1. Worsening nodal disease since recent imaging from May in April in the chest and abdomen though improved when compared to January. Findings may reflect either sequela of age related infection or could potentially be related to neoplasm/lymphoproliferative disorder. Would suggest correlation with PET imaging to select a site for sampling. This could also be utilized to assess pulmonary findings that have raise concern on recent imaging. 2. More masslike appearance of RIGHT upper lobe changes and LEFT upper lobe nodules as discussed above, consideration for pulmonary neoplasm as discussed on recent imaging. 3. Signs of pulmonary emphysema. 4. Three-vessel coronary artery disease. 5. Low-attenuation cardiac blood pool compatible with anemia. 6. Stranding about jejunal loops in the upper abdomen is of uncertain significance but is associated with increasing nodal disease, perhaps related to inflammation or lymphatic congestion. Attention on follow-up. 7. Bilateral perinephric stranding which was not evident on most recent abdominal imaging. Correlation with urinalysis is suggested. Aortic Atherosclerosis (ICD10-I70.0) and Emphysema (ICD10-J43.9). Electronically Signed   By: Zetta Bills M.D.   On: 11/25/2021 15:35   DG CHEST PORT 1 VIEW  Result Date: 11/14/2021 CLINICAL DATA:  [id number]. Constant cough status post bronchoscopy with bronchoalveolar  lavage EXAM: PORTABLE CHEST 1 VIEW COMPARISON:  CT chest angiography 10/29/2021, CT angio chest 10/06/2021 FINDINGS: The heart and mediastinal contours are within normal limits. Left base atelectasis. Persistent right upper lobe airspace opacity. Chronic coarsened Essure markings within the upper lobes. No pulmonary edema. No pleural effusion. No pneumothorax. No acute osseous abnormality. IMPRESSION: 1. No acute cardiopulmonary abnormality. 2. Persistent right upper lobe mass. Finding is better evaluated on CT angiography chest 10/29/2021. 3.  Emphysema (ICD10-J43.9). Electronically Signed   By: Iven Finn M.D.   On: 11/14/2021 16:10   DG C-Arm 1-60 Min-No Report  Result Date: 11/14/2021 Fluoroscopy was utilized by the requesting physician.  No radiographic interpretation.   CT Chest W Contrast  Result Date: 11/09/2021 CLINICAL DATA:  Short of breath, weakness, poor appetite for several days, lung mass, HIV EXAM: CT CHEST WITH CONTRAST TECHNIQUE: Multidetector CT imaging of the chest was performed during intravenous contrast administration. RADIATION DOSE REDUCTION: This exam was performed according to the departmental dose-optimization program which includes automated exposure control, adjustment of the mA and/or kV according to patient size and/or use of iterative reconstruction technique. CONTRAST:  11m OMNIPAQUE IOHEXOL 300 MG/ML  SOLN COMPARISON:  10/29/2021 FINDINGS: Cardiovascular: This examination is not tailored for the evaluation of the pulmonary vasculature. The right lower lobe pulmonary emboli seen on prior exam are less pronounced on this study. The heart is unremarkable without pericardial effusion. Diffuse coronary artery atherosclerosis is unchanged. No evidence of thoracic aortic aneurysm or dissection. Stable aortic atherosclerosis. Mediastinum/Nodes: Stable soft tissue at the left hilum measuring approximate 4.2 x 2.4 cm image 57/2, consistent with lymphadenopathy or primary  bronchogenic malignancy. Right hilar adenopathy measuring up to 1.5 cm in short axis unchanged. Stable subcarinal lymph node measuring up to 1.4 cm in short axis. Thyroid, trachea, and esophagus are  unremarkable. Lungs/Pleura: Severe bullous emphysematous changes are again noted. Spiculated left upper lobe nodule image 65/5 measures 1 cm, not appreciably changed. Rounded 9 mm left upper lobe solid nodule image 50/5 unchanged. Subpleural consolidation with spiculated margins in the right upper lobe measures 3.6 x 1.3 cm image 34/5, not appreciably changed. No acute airspace disease, effusion, or pneumothorax. Central airways are patent. Upper Abdomen: No acute abnormality. Musculoskeletal: No acute or destructive bony lesions. Reconstructed images demonstrate no additional findings. IMPRESSION: 1. Decreased prominence of the right lower lobe pulmonary emboli, with apparent resolution of the remaining pulmonary emboli seen previously. Evaluation of the pulmonary vasculature is limited on this exam due to technique and timing of contrast bolus. 2. Left upper lobe nodules and right upper lobe masslike consolidation, underlying malignancy not excluded. Follow-up PET CT may be useful if the patient would be a therapy candidate should neoplasm be detected. 3. Stable mediastinal and hilar lymphadenopathy. Differential includes metastatic disease or primary lymphoproliferative disorder in this patient with history of HIV. 4. Aortic Atherosclerosis (ICD10-I70.0) and Emphysema (ICD10-J43.9). Electronically Signed   By: Randa Ngo M.D.   On: 11/09/2021 18:51   DG Chest Port 1 View  Result Date: 11/09/2021 CLINICAL DATA:  Shortness of breath and weakness.  Poor appetite. EXAM: PORTABLE CHEST 1 VIEW COMPARISON:  Multiple exams, including CTA chest 10/29/2021 FINDINGS: 3.5 by 1.6 cm right upper lobe nodular density. This is roughly similar to prior. Indistinct nodularity in the lingula. The smaller left upper lobe  pulmonary nodule shown on chest CT is not readily apparent. Emphysema. Heart size is within normal limits. Indistinct AP window compatible with previously demonstrated adenopathy. No blunting of the costophrenic angles. IMPRESSION: 1. Stable appearance of right upper lobe and lingular densities. Lung cancer is not excluded. 2. Effacement of the AP window compatible with known adenopathy in this vicinity. 3.  Emphysema (ICD10-J43.9).  / Electronically Signed   By: Van Clines M.D.   On: 11/09/2021 14:51     Discharge Exam: Vitals:   12/06/21 2115 12/07/21 0526  BP: 102/71 120/85  Pulse: (!) 109 (!) 117  Resp: 18 20  Temp: 99.5 F (37.5 C) 99 F (37.2 C)  SpO2: 94% 97%   Vitals:   12/06/21 1733 12/06/21 2033 12/06/21 2115 12/07/21 0526  BP: 94/71  102/71 120/85  Pulse: (!) 116  (!) 109 (!) 117  Resp: _0 Temp: 98.3 F (36.8 C)  99.5 F (37.5 C) 99 F (37.2 C)  TempSrc: Oral     SpO2: 98% 93% 94% 97%  Weight:      Height:        General: Pt is alert, awake, not in acute distress, cachectic and chronically sick looking Cardiovascular: RRR, S1/S2 +, no rubs, no gallops Respiratory: CTA bilaterally, no wheezing, no rhonchi Abdominal: Soft, NT, ND, bowel sounds + Extremities: no edema, no cyanosis    The results of significant diagnostics from this hospitalization (including imaging, microbiology, ancillary and laboratory) are listed below for reference.     Microbiology: Recent Results (from the past 240 hour(s))  Aerobic/Anaerobic Culture w Gram Stain (surgical/deep wound)     Status: None (Preliminary result)   Collection Time: 12/03/21 12:19 PM   Specimen: Abscess  Result Value Ref Range Status   Specimen Description   Final    ABSCESS Performed at [facility] [address] [place], [place] [address]    Special Requests   Final    Immunocompromised Performed  at [facility], [place] [address], [place], [place]  [address]    Gram Stain   Final    ABUNDANT WBC PRESENT,BOTH PMN AND MONONUCLEAR NO ORGANISMS SEEN    Culture   Final    NO GROWTH 3 DAYS NO ANAEROBES ISOLATED; CULTURE IN PROGRESS FOR 5 DAYS Performed at [facility], [place] [address] [place], [place] [address]    Report Status PENDING  Incomplete  Culture, fungus without smear     Status: None (Preliminary result)   Collection Time: 12/03/21  5:39 PM   Specimen: Abscess; Other  Result Value Ref Range Status   Specimen Description   Final    ABSCESS Performed at [facility] [address], [place], [place] [address]    Special Requests   Final    Immunocompromised Performed at [facility], [place] [address], [place], [place] [address]    Culture   Final    NO FUNGUS ISOLATED AFTER 3 DAYS Performed at [facility], [place] [address], [place], [place] [address]    Report Status PENDING  Incomplete  Acid Fast Smear (AFB)     Status: None   Collection Time: 12/03/21  7:02 PM   Specimen: Abscess  Result Value Ref Range Status   AFB Specimen Processing Concentration  Final   Acid Fast Smear Negative  Final    Comment: (NOTE) Performed At: [facility] [place], [place] [id number] Rush Farmer MD [id number]    Source (AFB) ABSCESS  Final    Comment: Performed at [facility], [place] [address], [place], [place] [address]  Aerobic/Anaerobic Culture w Gram Stain (surgical/deep wound)     Status: None (Preliminary result)   Collection Time: 12/05/21  1:21 PM   Specimen: Abscess  Result Value Ref Range Status   Specimen Description   Final    ABSCESS Performed at [facility] [address] [place], [place] [address]    Special Requests JUST BELOW THYROID AT MIDLINE  Final   Gram Stain NO WBC SEEN NO ORGANISMS SEEN   Final   Culture   Final    NO GROWTH < 12 HOURS Performed at [facility], [place] [address]  [place], [place] [address]    Report Status PENDING  Incomplete     Labs: BNP (last 3 results) Recent Labs    10/06/21 0338 10/29/21 1744  BNP 32.5 858.8*   Basic Metabolic Panel: Recent Labs  Lab 12/01/21 0456 12/03/21 1036 12/05/21 0502  NA 136 135 138  K 4.9 3.7 3.7  CL 107 110 109  CO2 19* 16* 18*  GLUCOSE 94 124* 90  BUN 11 5* <5*  CREATININE 0.96 0.69 0.72  CALCIUM 10.2 9.1 9.2   Liver Function Tests: Recent Labs  Lab 12/01/21 0456  AST 66*  ALT 50*  ALKPHOS 330*  BILITOT 0.6  PROT 8.0  ALBUMIN 2.8*   No results for input(s): "LIPASE", "AMYLASE" in the last 168 hours. No results for input(s): "AMMONIA" in the last 168 hours. CBC: Recent Labs  Lab 12/03/21 1035 12/05/21 0502  WBC 8.4 7.9  NEUTROABS 6.8 6.1  HGB 8.0* 7.8*  HCT 27.0* 25.6*  MCV 91.2 89.2  PLT 481* 464*   Cardiac Enzymes: No results for input(s): "CKTOTAL", "CKMB", "CKMBINDEX", "TROPONINI" in the last 168 hours. BNP: Invalid input(s): "POCBNP" CBG: No results for input(s): "GLUCAP" in the last 168 hours. D-Dimer No  results for input(s): "DDIMER" in the last 72 hours. Hgb A1c No results for input(s): "HGBA1C" in the last 72 hours. Lipid Profile No results for input(s): "CHOL", "HDL", "LDLCALC", "TRIG", "CHOLHDL", "LDLDIRECT" in the last 72 hours. Thyroid function studies No results for input(s): "TSH", "T4TOTAL", "T3FREE", "THYROIDAB" in the last 72 hours.  Invalid input(s): "FREET3" Anemia work up No results for input(s): "VITAMINB12", "FOLATE", "FERRITIN", "TIBC", "IRON", "RETICCTPCT" in the last 72 hours. Urinalysis    Component Value Date/Time   COLORURINE YELLOW 11/18/2021 0528   APPEARANCEUR CLEAR 11/18/2021 0528   LABSPEC 1.021 11/18/2021 0528   PHURINE 6.0 11/18/2021 0528   GLUCOSEU NEGATIVE 11/18/2021 0528   HGBUR NEGATIVE 11/18/2021 0528   BILIRUBINUR NEGATIVE 11/18/2021 0528   KETONESUR NEGATIVE 11/18/2021 0528   PROTEINUR NEGATIVE 11/18/2021 0528   UROBILINOGEN  1.0 03/20/2015 0000   NITRITE NEGATIVE 11/18/2021 0528   LEUKOCYTESUR NEGATIVE 11/18/2021 0528   Sepsis Labs Recent Labs  Lab 12/03/21 1035 12/05/21 0502  WBC 8.4 7.9   Microbiology Recent Results (from the past 240 hour(s))  Aerobic/Anaerobic Culture w Gram Stain (surgical/deep wound)     Status: None (Preliminary result)   Collection Time: 12/03/21 12:19 PM   Specimen: Abscess  Result Value Ref Range Status   Specimen Description   Final    ABSCESS Performed at [facility], [place] [address], [place], [place] [address]    Special Requests   Final    Immunocompromised Performed at [facility], [place] [address], [place], [place] [address]    Gram Stain   Final    ABUNDANT WBC PRESENT,BOTH PMN AND MONONUCLEAR NO ORGANISMS SEEN    Culture   Final    NO GROWTH 3 DAYS NO ANAEROBES ISOLATED; CULTURE IN PROGRESS FOR 5 DAYS Performed at [facility], [place] [address] [place], [place] [address]    Report Status PENDING  Incomplete  Culture, fungus without smear     Status: None (Preliminary result)   Collection Time: 12/03/21  5:39 PM   Specimen: Abscess; Other  Result Value Ref Range Status   Specimen Description   Final    ABSCESS Performed at [facility] [address] [place], [place] [address]    Special Requests   Final    Immunocompromised Performed at [facility], [place] [address], [place], [place] [address]    Culture   Final    NO FUNGUS ISOLATED AFTER 3 DAYS Performed at [facility], [place] [address], [place], [place] [address]    Report Status PENDING  Incomplete  Acid Fast Smear (AFB)     Status: None   Collection Time: 12/03/21  7:02 PM   Specimen: Abscess  Result Value Ref Range Status   AFB Specimen Processing Concentration  Final   Acid Fast Smear Negative  Final    Comment: (NOTE) Performed At: [facility] [place], [place]  [id number] Rush Farmer MD [id number]    Source (AFB) ABSCESS  Final    Comment: Performed at [facility], [place] [address], [place], [place] [address]  Aerobic/Anaerobic Culture w Gram Stain (surgical/deep wound)     Status: None (Preliminary result)   Collection Time: 12/05/21  1:21 PM   Specimen: Abscess  Result Value Ref Range Status   Specimen Description   Final    ABSCESS Performed at [facility] [address], [place], [place] [address]    Special Requests  JUST BELOW THYROID AT MIDLINE  Final   Gram Stain NO WBC SEEN NO ORGANISMS SEEN   Final   Culture   Final    NO GROWTH < 12 HOURS Performed at [facility], [place] [address] [place], [place] [address]    Report Status PENDING  Incomplete     Time coordinating discharge: Over 30 minutes  SIGNED:   Darliss Cheney, MD  [facility] 12/07/2021, 11:18 AM *Please note that this is a verbal dictation therefore any spelling or grammatical errors are due to the "[employer] One" system interpretation. If 7PM-7AM, please contact night-coverage [email or web address]

## 2021-12-07 NOTE — Progress Notes (Signed)
Nutrition Follow-up  DOCUMENTATION CODES:   Severe malnutrition in context of acute illness/injury  INTERVENTION:  - continue Ensure QID.  - will enter smart phrase in AVS for patient to drink 2 bottles/day of Ensure Plus (or similar) for at least 1 month after d/c.   NUTRITION DIAGNOSIS:   Severe Malnutrition related to acute illness as evidenced by energy intake < or equal to 50% for > or equal to 5 days, percent weight loss. -ongoing  GOAL:   Patient will meet greater than or equal to 90% of their needs -unmet  MONITOR:   PO intake, Supplement acceptance, Labs, Weight trends   ASSESSMENT:   46 y.o. male with medical history of substance use, RUL mass pending bronch by Pulmonology to evaluate, HIV/AIDS, PML, MAI, emphysema, CVA with residual R-sided hemiparesis, and depression. He was admitted 5/20-5/24 due to shortness of breath and at that time was noted at that time to have DVT and PE with R-sided heart strain. He returned to the ED due to ongoing shortness of breath, weakness,  cough which is sometimes painful, sensation of his heart racing, and decreased urine output with concern for dehydration. Patient was admitted for SIRS.  Patient sleeping at the time of RD visit. He did not awake to name call x3. No visitors present at the time of RD visit. Two bowls of cereal and a box of Ritz crackers on bedside table.   Flow sheet documentation of meal intakes limited since last RD follow-up on 6/22: 0% of breakfast on 6/24, 0% of breakfast, 75% of lunch, and 0% of dinner on 6/26. He has been accepting Ensure ~75% of the time offered.   On 6/22 he had CT chest and abdomen which showed extensive necrotic-appearing lymph nodes in chest and abdomen and showed RUL lesion and psoas abscesses; notes state these seem to be associated with necrotic adenopathy in the retroperitoneum.   On 6/24 he underwent drainage of iliopsoas abscess and on 6/26 underwent aspiration of neck abscess.   He  has not been weighed since 6/19.  Discharge order and discharge summary for d/c home were entered late morning today.    Labs reviewed; BUN: <5 mg/dl.  Medications reviewed; 1 tablet vitamin B complex with vitamin C/day, 5 mg marinol TID, 5 mg IV reglan QID, 15 mg remeron/day, 5 ml mycostatin QID, 40 mg oral protonix/day.   Diet Order:   Diet Order             Diet regular Room service appropriate? Yes; Fluid consistency: Thin  Diet effective now                   EDUCATION NEEDS:   No education needs have been identified at this time  Skin:  Skin Assessment: Skin Integrity Issues: Skin Integrity Issues:: Incisions Incisions: L lumbar (6/24)  Last BM:  6/28 (type 7 x1, medium amount)  Height:   Ht Readings from Last 1 Encounters:  11/09/21 5\' 6"  (1.676 m)    Weight:   Wt Readings from Last 1 Encounters:  11/28/21 65.5 kg     BMI:  Body mass index is 23.31 kg/m.  Estimated Nutritional Needs:  Kcal:  2050-2250 kcal Protein:  102-115 grams Fluid:  >/= 2.2 L/day     01-12-1979, MS, RD, LDN, CNSC Registered Dietitian II Inpatient Clinical Nutrition RD pager # and on-call/weekend pager # available in [facility]

## 2021-12-07 NOTE — Discharge Instructions (Signed)
Nutrition Post Hospital Stay Proper nutrition can help your body recover from illness and injury.   Foods and beverages high in protein, vitamins, and minerals help rebuild muscle loss, promote healing, & reduce fall risk.   In addition to eating healthy foods, a nutrition shake is an easy, delicious way to get the nutrition you need during and after your hospital stay  It is recommended that you continue to drink 2 bottles per day of: Ensure Plus or similar for at least 1 month (30 days) after your hospital stay   Tips for adding a nutrition shake into your routine: As allowed, drink one with vitamins or medications instead of water or juice Enjoy one as a tasty mid-morning or afternoon snack Drink cold or make a milkshake out of it Drink one instead of milk with cereal or snacks Use as a coffee creamer   Available at the following grocery stores and pharmacies:           * [employer] * [employer] * [employer]  * [employer]          * [employer] * [employer]  * Wal[facility]reens      * Target  * BJ's   * [facility]  * [employer]   * [facility] [phone]            For COUPONS visit: [email or web address] or [email or web address]   Suggested Substitutions Ensure Plus = Boost Plus = Carnation Breakfast Essentials = Boost Compact Ensure Active Clear = Boost Breeze Glucerna Shake = Boost Glucose Control = Carnation Breakfast Essentials SUGAR FREE     

## 2021-12-07 NOTE — TOC Transition Note (Signed)
Transition of Care [facility]) - CM/SW Discharge Note   Patient Details  Name: Kurt Foley MRN: [id number] Date of Birth: 1975/06/30  Transition of Care [facility]) CM/SW Contact:  Lanier Clam, RN Phone Number: 12/07/2021, 11:37 AM   Clinical Narrative: d/c home. No further CM needs      Final next level of care: OP Rehab     Patient Goals and CMS Choice        Discharge Placement                       Discharge Plan and Services                                     Social Determinants of Health (SDOH) Interventions     Readmission Risk Interventions    10/31/2021    9:45 AM  Readmission Risk Prevention Plan  Transportation Screening Complete  Medication Review (RN Care Manager) Complete  PCP or Specialist appointment within 3-5 days of discharge Complete  HRI or Home Care Consult Complete  SW Recovery Care/Counseling Consult Complete  Palliative Care Screening Not Applicable  Skilled Nursing Facility Not Applicable

## 2021-12-08 ENCOUNTER — Telehealth: Payer: Self-pay

## 2021-12-08 LAB — AEROBIC/ANAEROBIC CULTURE W GRAM STAIN (SURGICAL/DEEP WOUND): Culture: NO GROWTH

## 2021-12-08 NOTE — Telephone Encounter (Signed)
Transition Care Management Unsuccessful Follow-up Telephone Call  Date of discharge and from where:  12/07/2021, [facility]  Attempts:  1st Attempt  Reason for unsuccessful TCM follow-up call:  Left voice messages on # [phone] and # [phone], call back requested.  Need to schedule hospital follow up appointment with PCP

## 2021-12-10 LAB — AEROBIC/ANAEROBIC CULTURE W GRAM STAIN (SURGICAL/DEEP WOUND)
Culture: NO GROWTH
Gram Stain: NONE SEEN

## 2021-12-12 ENCOUNTER — Telehealth: Payer: Self-pay

## 2021-12-12 LAB — ACID FAST SMEAR (AFB, MYCOBACTERIA): Acid Fast Smear: NEGATIVE

## 2021-12-12 NOTE — Telephone Encounter (Signed)
Transition Care Management Unsuccessful Follow-up Telephone Call  Date of discharge and from where:  12/07/2021, [facility]  Attempts:  2nd Attempt  Reason for unsuccessful TCM follow-up call:  Left voice message on # [phone] , call back requested.  Need to schedule hospital follow up appointment with PCP  Call also placed to # [phone], and the recording stated that the voicemail has not been activated.

## 2021-12-14 ENCOUNTER — Telehealth: Payer: Self-pay

## 2021-12-14 NOTE — Telephone Encounter (Signed)
Transition Care Management Follow-up Telephone Call Date of discharge and from where: 12/07/2021, [facility]  How have you been since you were released from the hospital? I spoke to the patient and he requested I speak to his aunt, Kurt Foley, who helps care for him.  I tried to reach [name] at the number the patient provided # [phone] and the recording stated that the call could not be completed at this time.   Letter sent to patient requesting he contact this office to schedule a follow up appointment.

## 2021-12-14 NOTE — Telephone Encounter (Signed)
Transition Care Management Follow-up Telephone Call  Call returned to patient's aunt, Kurt Foley Date of discharge and from where: 12/07/2021, [facility] How have you been since you were released from the hospital? Kurt Foley said she needs a lot of help with him. She said he needs assistance with everything and she is the primary caregiver.  Any questions or concerns? Yes - noted above regarding help needed.   Items Reviewed: Did the pt receive and understand the discharge instructions provided? Yes - Kurt Foley has them but said she needs to review them again.  Medications obtained and verified? Yes - she said she has all of his medications and manages his med regime but will double check all of the orders on the AVS again.  She didn't have any questions about the med regime  Other? No  Any new allergies since your discharge? No  Dietary orders reviewed? Yes - she said his appetite is poor.  Do you have support at home? Yes - he lives with Kurt Foley.   Home Care and Equipment/Supplies: Were home health services ordered? no If so, what is the name of the agency? N/a  Has the agency set up a time to come to the patient's home? not applicable Were any new equipment or medical supplies ordered?  No What is the name of the medical supply agency? N/a Were you able to get the supplies/equipment? not applicable Do you have any questions related to the use of the equipment or supplies? No  He has been referred for outpatient PT.  Functional Questionnaire: (I = Independent and D = Dependent) ADLs: dependent for ambulation, personal care.  Kurt Foley provides the assistance needed.  She said is not using any DME but could benefit from a BSC.   A PCS referral would be beneficial if PCP is in agreement but he will need to have an encounter with provider in order to submit the referral.  She is agreeable to taking him to any [facility]. I offered her an appointment for him tomorrow at [facility] but she said  she is not able to take him. Nothing available for a few weeks at [facility] or [facility].  I told her that I would need to check with other [facility] options.    Follow up appointments reviewed:  PCP Hospital f/u appt confirmed?  Still needs to schedule an appointment.  Specialist Hospital f/u appt confirmed? Yes  Scheduled to see ID- 7/20/230 Are transportation arrangements needed? No  If their condition worsens, is the pt aware to call PCP or go to the Emergency Dept.? Yes Was the patient provided with contact information for the PCP's office or ED? Yes Was to pt encouraged to call back with questions or concerns? Yes

## 2021-12-14 NOTE — Telephone Encounter (Signed)
Routing to Jane. 

## 2021-12-14 NOTE — Telephone Encounter (Signed)
Pt aunt (tracy) calling back. You can reach her at [phone] to schedule a hospital follow up. She also states that she does need some help with him because he is having trouble walking.

## 2021-12-15 ENCOUNTER — Telehealth: Payer: Self-pay

## 2021-12-15 NOTE — Telephone Encounter (Signed)
I called patient's aunt, French Ana # [phone] to inform her that an appointment is available at [facility] on 7/11 @ 1120. The recording stated that the call could not be completed at this time

## 2021-12-21 LAB — FUNGUS CULTURE WITH STAIN

## 2021-12-21 LAB — FUNGUS CULTURE RESULT

## 2021-12-21 LAB — FUNGAL ORGANISM REFLEX

## 2021-12-22 NOTE — Telephone Encounter (Signed)
I spoke to patient's aunt, French Ana [phone] regarding scheduling a hospital follow up appointment.  She explained that he is not eating well and needs a lot of help with "everything."  She said he needs to be better and be able to do more for himself. She is interested in inpatient rehab for him.  I explained to her that the provider will need to assess him first.   She then said that he has a "knot" on the front of his neck.  When asked how large it is, she said "pretty big."  She said she is on her way to the house now to see him and will talk to him about it and he will go to either UC or ED today to have it assessed.  She will then call me back with an update and to schedule at appointment for him at [facility]

## 2021-12-24 LAB — CULTURE, FUNGUS WITHOUT SMEAR

## 2021-12-26 ENCOUNTER — Telehealth: Payer: Self-pay | Admitting: Emergency Medicine

## 2021-12-26 NOTE — Telephone Encounter (Signed)
Call returned to [facility] [phone], message left with call back requested.

## 2021-12-26 NOTE — Telephone Encounter (Signed)
Patient's aunt requesting call about patient's upcoming appointment

## 2021-12-27 NOTE — Telephone Encounter (Signed)
I called and spoke to patient's aunt, French Ana.  She said he is back in the hospital- [facility]. He has an infection in his back and has drains in place. He is also on 5 antibiotics.  She is relieved he is getting the care that he needs in the hospital and she noted that the plan is for him to go to inpatient rehab when he is ready for discharge from the hospital. She said she will call this clinic when he needs a follow up appointment

## 2021-12-29 ENCOUNTER — Ambulatory Visit: Payer: Medicaid Other | Admitting: Internal Medicine

## 2021-12-29 LAB — MAC SUSCEPTIBILITY BROTH
Amikacin: 8
Ciprofloxacin: 8
Clarithromycin: 0.5
Doxycycline: >8
Linezolid: >32
Minocycline: >8
Rifabutin: 0.12
Rifampin: 4
Streptomycin: 16

## 2021-12-29 LAB — ACID FAST CULTURE WITH REFLEXED SENSITIVITIES (MYCOBACTERIA)
Acid Fast Culture: NEGATIVE
Acid Fast Culture: POSITIVE — AB

## 2021-12-29 LAB — AFB ORGANISM ID BY DNA PROBE
M avium complex: POSITIVE — AB
M tuberculosis complex: NEGATIVE

## 2021-12-29 NOTE — Progress Notes (Deleted)
[facility] for Infectious Disease   CHIEF COMPLAINT    HIV follow up.    SUBJECTIVE:    Kurt Foley is a 46 y.o. male with PMHx as below who presents to the clinic for HIV follow up.   Please see A&P for the details of today's visit and status of the patient's medical problems.   Patient's Medications  New Prescriptions   No medications on file  Previous Medications   ABACAVIR-DOLUTEGRAVIR-LAMIVUDINE (TRIUMEQ) 600-50-300 MG TABLET    Take 1 tablet by mouth daily.   ACETAMINOPHEN (TYLENOL) 325 MG TABLET    Take 1-2 tablets (325-650 mg total) by mouth every 4 (four) hours as needed for mild pain.   ALBUTEROL (VENTOLIN HFA) 108 (90 BASE) MCG/ACT INHALER    Inhale 2 puffs into the lungs every 6 hours as needed for wheezing or shortness of breath.   AMOXICILLIN-CLAVULANATE (AUGMENTIN) 875-125 MG TABLET    Take 1 tablet by mouth 2 times daily.   AZITHROMYCIN (ZITHROMAX) 500 MG TABLET    Take 1 tablet by mouth at bedtime.   BUDESONIDE-FORMOTEROL (SYMBICORT) 160-4.5 MCG/ACT INHALER    Inhale 2 puffs into the lungs in the morning and at bedtime.   DRONABINOL (MARINOL) 5 MG CAPSULE    Take 1 capsule (5 mg total) by mouth 3 (three) times daily before meals.   ETHAMBUTOL (MYAMBUTOL) 400 MG TABLET    Take 2 tablets by mouth daily.   LORATADINE (CLARITIN) 10 MG TABLET    Take 1 tablet (10 mg total) by mouth daily.   METOCLOPRAMIDE (REGLAN) 5 MG TABLET    Take 1 tablet  by mouth 4 times daily-before meals and at bedtime.   METOPROLOL SUCCINATE (TOPROL-XL) 100 MG 24 HR TABLET    Take 1 tablet by mouth every morning. Take with or immediately following a meal.   MIRTAZAPINE (REMERON) 15 MG TABLET    Take 1 tablet (15 mg total) by mouth at bedtime.   MULTIPLE VITAMINS-MINERALS (CERTAVITE/ANTIOXIDANTS) TABS    Take 1 tablet by mouth daily.   OXYCODONE (OXY IR/ROXICODONE) 5 MG IMMEDIATE RELEASE TABLET    Take 1 tablet  by mouth every 6 hours as needed for moderate pain or severe  pain.   PANTOPRAZOLE (PROTONIX) 40 MG TABLET    Take 1 tablet by mouth daily before breakfast.   POLYETHYLENE GLYCOL (MIRALAX / GLYCOLAX) 17 G PACKET    Take 17 g by mouth daily as needed for mild constipation or moderate constipation.   QUETIAPINE (SEROQUEL) 200 MG TABLET    Take 1 tablet (200 mg total) by mouth at bedtime.   RIFABUTIN (MYCOBUTIN) 150 MG CAPSULE    Take 2 capsules by mouth daily.   RIVAROXABAN (XARELTO) VTE STARTER PACK (15 & 20 MG)    Follow package directions: Take one $Remove'15mg'qSxvePU$  tablet by mouth twice a day. On day 22, switch to one $Remo'20mg'eFHdW$  tablet once a day. Take with food.   ROSUVASTATIN (CRESTOR) 5 MG TABLET    Take 1 tablet (5 mg total) by mouth daily.   SENNA-DOCUSATE (SENOKOT-S) 8.6-50 MG TABLET    Take 1 tablet by mouth 2 (two) times daily.   SULFAMETHOXAZOLE-TRIMETHOPRIM (BACTRIM DS) 800-160 MG TABLET    Take 1 tablet by mouth daily.   UMECLIDINIUM BROMIDE (INCRUSE ELLIPTA) 62.5 MCG/ACT AEPB    Inhale 1 puff into the lungs daily.  Modified Medications   No medications on file  Discontinued Medications   No medications on  file      Past Medical History:  Diagnosis Date   Depression    HIV infection ([place])     Social History   Tobacco Use   Smoking status: Former    Packs/day: 0.50    Years: 25.00    Total pack years: 12.50    Types: Cigarettes   Smokeless tobacco: Never   Tobacco comments:    cutting back  Vaping Use   Vaping Use: Never used  Substance Use Topics   Alcohol use: Not Currently    Alcohol/week: 2.0 standard drinks of alcohol    Types: 2 Standard drinks or equivalent per week    Comment: 3-4 days a week.Last drink: today   Drug use: Not Currently    Types: Marijuana, Cocaine    Comment: Once a week. Last used yesterday.  Cocaine last used today.     Family History  Problem Relation Age of Onset   Rheum arthritis Mother    Diabetes Mother    Hypertension Mother    Arthritis Mother     Allergies  Allergen Reactions   Lidocaine Other  (See Comments)    Large bruise at site of patch placement    ROS   OBJECTIVE:    There were no vitals filed for this visit.   There is no height or weight on file to calculate BMI.  Physical Exam  Labs and Microbiology:    Latest Ref Rng & Units 12/05/2021    5:02 AM 12/03/2021   10:36 AM 12/01/2021    4:56 AM  CMP  Glucose 70 - 99 mg/dL 90  124  94   BUN 6 - 20 mg/dL '5  5  11   '$ Creatinine 0.61 - 1.24 mg/dL 0.72  0.69  0.96   Sodium 135 - 145 mmol/L 138  135  136   Potassium 3.5 - 5.1 mmol/L 3.7  3.7  4.9   Chloride 98 - 111 mmol/L 109  110  107   CO2 22 - 32 mmol/L $RemoveB'18  16  19   'eCOLjAWX$ Calcium 8.9 - 10.3 mg/dL 9.2  9.1  10.2   Total Protein 6.5 - 8.1 g/dL   8.0   Total Bilirubin 0.3 - 1.2 mg/dL   0.6   Alkaline Phos 38 - 126 U/L   330   AST 15 - 41 U/L   66   ALT 0 - 44 U/L   50       Latest Ref Rng & Units 12/05/2021    5:02 AM 12/03/2021   10:35 AM 11/30/2021    5:57 AM  CBC  WBC 4.0 - 10.5 K/uL 7.9  8.4  15.0   Hemoglobin 13.0 - 17.0 g/dL 7.8  8.0  8.3   Hematocrit 39.0 - 52.0 % 25.6  27.0  26.8   Platelets 150 - 400 K/uL 464  481  453      Lab Results  Component Value Date   HIV1RNAQUANT 21 (H) 10/19/2021   HIV1RNAQUANT 27 (H) 08/17/2021   HIV1RNAQUANT <20 (H) 07/08/2021   CD4TABS 46 (L) 10/19/2021   CD4TABS <35 (L) 05/25/2021   CD4TABS <35 (L) 04/21/2021    RPR and STI: Lab Results  Component Value Date   LABRPR NON REACTIVE 11/12/2021   LABRPR NON REACTIVE 07/14/2021   LABRPR NON-REACTIVE 02/24/2021   LABRPR NON-REACTIVE 07/01/2019   LABRPR NON REAC 12/26/2016    STI Results GC CT  02/24/2021  4:24 PM Negative  Negative   07/01/2019  10:02 AM Negative  Negative     Hepatitis B: Lab Results  Component Value Date   HEPBSAB POS (A) 10/30/2013   HEPBSAG NON REACTIVE 10/06/2021   HEPBCAB REACTIVE (A) 10/30/2013   Hepatitis C: No results found for: "HEPCAB", "HCVRNAPCRQN" Hepatitis A: Lab Results  Component Value Date   HAV REACTIVE (A)  10/30/2013   Lipids: Lab Results  Component Value Date   CHOL 91 02/24/2021   TRIG 185 (H) 02/24/2021   HDL 11 (L) 02/24/2021   CHOLHDL 8.3 (H) 02/24/2021   VLDL 34 (H) 07/29/2015   LDLCALC 54 02/24/2021    Imaging: ***   ASSESSMENT & PLAN:    No problem-specific Assessment & Plan notes found for this encounter.   No orders of the defined types were placed in this encounter.      *** Vaccines Influenza: give every year COVID: recommend vaccination if not already done Prevnar 20: Give x 1 if no prior pneumonia vaccine.    - If only 1 dose of either PPSV 23 OR PCV 13 ----> give PCV 20 if > 1 year since last vaccine  - If received both PPSV 23 AND PCV 13 ----> give PCV 20 if > 5 years since last vaccine.  If < 5 years then wait to give PCV 20 Pneumovax-23: (if CD4 >200) give twice every 5 years apart before age 31, then once at age 57.  Give >8 weeks from [place] Prevnar-13: (preferably when CD4 >200) give once, give >1 year from last Pneumovax-23 Hepatitis A: give Havrix 2 dose series at 0 and 6-12 months if non-immune Hepatitis B: give Heplisav 2 dose series at 0 and 4 weeks if non-immune.  Repeat serology 2 months after vaccine and revaccinate if needed MenACWY: 2 dose primary series 8 weeks apart, then 1 dose booster every 5 years HPV: Gardasil-9 at 0, 2, and 6 months for ages 9-26 should be vaccinated.  Ages 95-45 should be offered if appropriate Tdap: give every 10 years Shingles: give Shingrix 2 dose series at 0 and 2-6 months if >50 years on ART with CD4 cell count >200 Varicella: primary vaccination may be considered in VZV seronegative persons aged >8 years (if CD4 >200)  MMR: vaccine should be given if born in 45 or after and do not have immunity (if CD4 >200)  Screening DEXA Scan: if age >23 Quantiferon: check at initiation of care Hepatitis C: check at initiation of care.  Screen annually if risk factors HLA B5701: check at initiation of care G6PD:  check if starting therapy with oxidant drugs Lipids: check annually Urinalysis: check annually or every 6 months if on tenofovir Hgb A1c: check annually  ASCVD Risk Score Consider high-intensity statin therapy if 10-year ASCVD risk score >7.5% The ASCVD Risk score (Arnett DK, et al., 2019) failed to calculate for the following reasons:   The patient has a prior MI or stroke diagnosis   [facility] for Infectious Disease [facility] 12/29/2021, 5:41 AM  HIV: ***  Need for PCP PPx: ***  Vaccines: ***  STI/Screening: ***  Encounter for medication monitoring: ***

## 2022-01-02 ENCOUNTER — Telehealth: Payer: Self-pay | Admitting: Nurse Practitioner

## 2022-01-02 NOTE — Telephone Encounter (Signed)
Please ask him what insurance he has. There is none listed. I can't order DME for uninsured. Thanks!

## 2022-01-02 NOTE — Telephone Encounter (Signed)
Caller requesting orders for a bedside commode and a wheelchair, please advise

## 2022-01-03 ENCOUNTER — Encounter (HOSPITAL_BASED_OUTPATIENT_CLINIC_OR_DEPARTMENT_OTHER): Payer: Self-pay

## 2022-01-03 ENCOUNTER — Other Ambulatory Visit: Payer: Self-pay

## 2022-01-03 ENCOUNTER — Emergency Department (HOSPITAL_BASED_OUTPATIENT_CLINIC_OR_DEPARTMENT_OTHER)
Admission: EM | Admit: 2022-01-03 | Discharge: 2022-01-03 | Discharge status: Against Medical Advice | Payer: Medicaid Other | Attending: Emergency Medicine | Admitting: Emergency Medicine

## 2022-01-03 DIAGNOSIS — M542 Cervicalgia: Secondary | ICD-10-CM | POA: Insufficient documentation

## 2022-01-03 DIAGNOSIS — Z5321 Procedure and treatment not carried out due to patient leaving prior to being seen by health care provider: Secondary | ICD-10-CM | POA: Diagnosis not present

## 2022-01-03 LAB — ACID FAST CULTURE WITH REFLEXED SENSITIVITIES (MYCOBACTERIA): Acid Fast Culture: NEGATIVE

## 2022-01-03 NOTE — ED Notes (Signed)
Pt left, stated wanted to go to different facility. Pt notified registration of departure. Refused to wait for assessment by provider

## 2022-01-03 NOTE — ED Triage Notes (Signed)
2 swollen painful red areas to neck x 2 weeks.

## 2022-01-03 NOTE — ED Notes (Signed)
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

## 2022-01-06 NOTE — Telephone Encounter (Signed)
Called pt and left vm about what his insurance he has

## 2022-01-10 LAB — ACID FAST CULTURE WITH REFLEXED SENSITIVITIES (MYCOBACTERIA): Acid Fast Culture: NEGATIVE

## 2022-01-12 ENCOUNTER — Ambulatory Visit: Payer: Medicaid Other | Admitting: Internal Medicine

## 2022-01-12 LAB — AFB ORGANISM ID BY DNA PROBE: M avium complex: POSITIVE — AB

## 2022-01-12 LAB — FUNGUS CULTURE WITH STAIN

## 2022-01-12 LAB — ACID FAST CULTURE WITH REFLEXED SENSITIVITIES (MYCOBACTERIA): Acid Fast Culture: POSITIVE — AB

## 2022-01-12 LAB — FUNGAL ORGANISM REFLEX

## 2022-01-12 LAB — FUNGUS CULTURE RESULT

## 2022-02-10 DEATH — deceased

## 2022-12-11 ENCOUNTER — Other Ambulatory Visit (HOSPITAL_COMMUNITY): Payer: Self-pay
